# Patient Record
Sex: Female | Born: 1935
Health system: Southern US, Community
[De-identification: ages and names within clinical notes are randomized; demographics above are authoritative.]

## PROBLEM LIST (undated history)

## (undated) DIAGNOSIS — J189 Pneumonia, unspecified organism: Secondary | ICD-10-CM

## (undated) DIAGNOSIS — R600 Localized edema: Secondary | ICD-10-CM

## (undated) DIAGNOSIS — R350 Frequency of micturition: Secondary | ICD-10-CM

## (undated) DIAGNOSIS — M1711 Unilateral primary osteoarthritis, right knee: Secondary | ICD-10-CM

## (undated) DIAGNOSIS — T8453XA Infection and inflammatory reaction due to internal right knee prosthesis, initial encounter: Secondary | ICD-10-CM

## (undated) DIAGNOSIS — F32A Depression, unspecified: Secondary | ICD-10-CM

## (undated) DIAGNOSIS — E669 Obesity, unspecified: Secondary | ICD-10-CM

## (undated) DIAGNOSIS — N200 Calculus of kidney: Secondary | ICD-10-CM

## (undated) DIAGNOSIS — Z8739 Personal history of other diseases of the musculoskeletal system and connective tissue: Secondary | ICD-10-CM

## (undated) DIAGNOSIS — I2510 Atherosclerotic heart disease of native coronary artery without angina pectoris: Secondary | ICD-10-CM

## (undated) DIAGNOSIS — M549 Dorsalgia, unspecified: Secondary | ICD-10-CM

## (undated) DIAGNOSIS — K219 Gastro-esophageal reflux disease without esophagitis: Secondary | ICD-10-CM

## (undated) DIAGNOSIS — I10 Essential (primary) hypertension: Secondary | ICD-10-CM

## (undated) DIAGNOSIS — M2540 Effusion, unspecified joint: Secondary | ICD-10-CM

## (undated) DIAGNOSIS — E785 Hyperlipidemia, unspecified: Secondary | ICD-10-CM

## (undated) DIAGNOSIS — M1712 Unilateral primary osteoarthritis, left knee: Secondary | ICD-10-CM

## (undated) DIAGNOSIS — R0609 Other forms of dyspnea: Secondary | ICD-10-CM

## (undated) DIAGNOSIS — R609 Edema, unspecified: Secondary | ICD-10-CM

## (undated) DIAGNOSIS — I82402 Acute embolism and thrombosis of unspecified deep veins of left lower extremity: Secondary | ICD-10-CM

## (undated) DIAGNOSIS — M1990 Unspecified osteoarthritis, unspecified site: Secondary | ICD-10-CM

## (undated) DIAGNOSIS — F329 Major depressive disorder, single episode, unspecified: Secondary | ICD-10-CM

## (undated) DIAGNOSIS — T7840XA Allergy, unspecified, initial encounter: Secondary | ICD-10-CM

## (undated) DIAGNOSIS — M2550 Pain in unspecified joint: Secondary | ICD-10-CM

## (undated) DIAGNOSIS — Z96659 Presence of unspecified artificial knee joint: Secondary | ICD-10-CM

## (undated) DIAGNOSIS — A491 Streptococcal infection, unspecified site: Secondary | ICD-10-CM

## (undated) HISTORY — DX: Allergy, unspecified, initial encounter: T78.40XA

## (undated) HISTORY — PX: TUBAL LIGATION: SHX77

## (undated) HISTORY — PX: COLONOSCOPY: SHX174

## (undated) HISTORY — PX: TONSILLECTOMY AND ADENOIDECTOMY: SUR1326

## (undated) HISTORY — DX: Atherosclerotic heart disease of native coronary artery without angina pectoris: I25.10

## (undated) HISTORY — DX: Calculus of kidney: N20.0

## (undated) HISTORY — PX: APPENDECTOMY: SHX54

## (undated) HISTORY — DX: Unspecified osteoarthritis, unspecified site: M19.90

## (undated) HISTORY — PX: CORONARY ANGIOPLASTY WITH STENT PLACEMENT: SHX49

## (undated) HISTORY — PX: CATARACT EXTRACTION W/ INTRAOCULAR LENS  IMPLANT, BILATERAL: SHX1307

## (undated) HISTORY — PX: ESOPHAGOGASTRODUODENOSCOPY: SHX1529

## (undated) HISTORY — DX: Gastro-esophageal reflux disease without esophagitis: K21.9

## (undated) HISTORY — DX: Hyperlipidemia, unspecified: E78.5

## (undated) HISTORY — DX: Essential (primary) hypertension: I10

## (undated) HISTORY — PX: FACIAL COSMETIC SURGERY: SHX629

## (undated) HISTORY — PX: EYE SURGERY: SHX253

## (undated) HISTORY — PX: OTHER SURGICAL HISTORY: SHX169

## (undated) HISTORY — PX: CARDIAC CATHETERIZATION: SHX172

---

## 1996-07-12 DIAGNOSIS — K219 Gastro-esophageal reflux disease without esophagitis: Secondary | ICD-10-CM | POA: Insufficient documentation

## 1996-07-12 DIAGNOSIS — M1990 Unspecified osteoarthritis, unspecified site: Secondary | ICD-10-CM

## 1996-07-12 HISTORY — DX: Unspecified osteoarthritis, unspecified site: M19.90

## 1999-03-15 DIAGNOSIS — E785 Hyperlipidemia, unspecified: Secondary | ICD-10-CM

## 1999-03-15 HISTORY — DX: Hyperlipidemia, unspecified: E78.5

## 1999-06-13 DIAGNOSIS — I10 Essential (primary) hypertension: Secondary | ICD-10-CM

## 1999-06-13 HISTORY — DX: Essential (primary) hypertension: I10

## 2002-08-26 DIAGNOSIS — T7840XA Allergy, unspecified, initial encounter: Secondary | ICD-10-CM

## 2002-08-26 HISTORY — DX: Allergy, unspecified, initial encounter: T78.40XA

## 2004-02-03 ENCOUNTER — Ambulatory Visit: Payer: Self-pay | Admitting: Family Medicine

## 2004-03-10 ENCOUNTER — Ambulatory Visit: Payer: Self-pay | Admitting: Family Medicine

## 2004-04-30 ENCOUNTER — Ambulatory Visit: Payer: Self-pay | Admitting: Family Medicine

## 2004-05-05 ENCOUNTER — Ambulatory Visit: Payer: Self-pay | Admitting: Family Medicine

## 2004-07-13 ENCOUNTER — Ambulatory Visit: Payer: Self-pay | Admitting: Family Medicine

## 2004-11-16 ENCOUNTER — Ambulatory Visit: Payer: Self-pay | Admitting: Family Medicine

## 2004-12-16 ENCOUNTER — Ambulatory Visit: Payer: Self-pay | Admitting: Family Medicine

## 2005-06-09 ENCOUNTER — Ambulatory Visit: Payer: Self-pay | Admitting: Family Medicine

## 2005-07-05 ENCOUNTER — Other Ambulatory Visit: Admission: RE | Admit: 2005-07-05 | Discharge: 2005-07-05 | Payer: Self-pay | Admitting: Family Medicine

## 2005-07-05 ENCOUNTER — Encounter (INDEPENDENT_AMBULATORY_CARE_PROVIDER_SITE_OTHER): Payer: Self-pay | Admitting: *Deleted

## 2005-07-05 ENCOUNTER — Ambulatory Visit: Payer: Self-pay | Admitting: Family Medicine

## 2005-08-17 ENCOUNTER — Ambulatory Visit: Payer: Self-pay | Admitting: Family Medicine

## 2005-08-30 ENCOUNTER — Ambulatory Visit: Payer: Self-pay | Admitting: Family Medicine

## 2006-02-17 ENCOUNTER — Ambulatory Visit: Payer: Self-pay | Admitting: Family Medicine

## 2006-06-21 ENCOUNTER — Encounter: Payer: Self-pay | Admitting: Family Medicine

## 2006-06-21 DIAGNOSIS — E669 Obesity, unspecified: Secondary | ICD-10-CM | POA: Insufficient documentation

## 2006-06-21 DIAGNOSIS — I259 Chronic ischemic heart disease, unspecified: Secondary | ICD-10-CM | POA: Insufficient documentation

## 2006-06-21 DIAGNOSIS — J45909 Unspecified asthma, uncomplicated: Secondary | ICD-10-CM | POA: Insufficient documentation

## 2006-06-21 DIAGNOSIS — E66811 Obesity, class 1: Secondary | ICD-10-CM

## 2006-06-21 HISTORY — DX: Obesity, class 1: E66.811

## 2006-06-21 HISTORY — DX: Obesity, unspecified: E66.9

## 2006-07-10 ENCOUNTER — Ambulatory Visit: Payer: Self-pay | Admitting: Family Medicine

## 2006-07-10 LAB — CONVERTED CEMR LAB
ALT: 22 units/L (ref 0–40)
AST: 23 units/L (ref 0–37)
Alkaline Phosphatase: 95 units/L (ref 39–117)
BUN: 10 mg/dL (ref 6–23)
Basophils Relative: 3.6 % — ABNORMAL HIGH (ref 0.0–1.0)
Bilirubin, Direct: 0.1 mg/dL (ref 0.0–0.3)
CO2: 30 meq/L (ref 19–32)
Calcium: 8.7 mg/dL (ref 8.4–10.5)
Chloride: 107 meq/L (ref 96–112)
Eosinophils Relative: 1.4 % (ref 0.0–5.0)
GFR calc non Af Amer: 106 mL/min
Glucose, Bld: 104 mg/dL — ABNORMAL HIGH (ref 70–99)
LDL Cholesterol: 106 mg/dL — ABNORMAL HIGH (ref 0–99)
Platelets: 251 10*3/uL (ref 150–400)
RBC: 4.28 M/uL (ref 3.87–5.11)
Total CHOL/HDL Ratio: 3.6
Total Protein: 7.2 g/dL (ref 6.0–8.3)
Triglycerides: 85 mg/dL (ref 0–149)
VLDL: 17 mg/dL (ref 0–40)
WBC: 6.7 10*3/uL (ref 4.5–10.5)

## 2006-07-11 ENCOUNTER — Ambulatory Visit: Payer: Self-pay | Admitting: Family Medicine

## 2006-10-02 ENCOUNTER — Ambulatory Visit: Payer: Self-pay | Admitting: Family Medicine

## 2006-10-02 ENCOUNTER — Encounter: Payer: Self-pay | Admitting: Family Medicine

## 2006-10-03 ENCOUNTER — Encounter: Payer: Self-pay | Admitting: Family Medicine

## 2006-10-04 ENCOUNTER — Encounter (INDEPENDENT_AMBULATORY_CARE_PROVIDER_SITE_OTHER): Payer: Self-pay | Admitting: *Deleted

## 2006-10-05 ENCOUNTER — Ambulatory Visit: Payer: Self-pay | Admitting: Family Medicine

## 2006-10-05 DIAGNOSIS — E749 Disorder of carbohydrate metabolism, unspecified: Secondary | ICD-10-CM | POA: Insufficient documentation

## 2006-10-10 ENCOUNTER — Ambulatory Visit: Payer: Self-pay | Admitting: Family Medicine

## 2007-01-26 ENCOUNTER — Ambulatory Visit: Payer: Self-pay | Admitting: Internal Medicine

## 2007-01-31 ENCOUNTER — Encounter (INDEPENDENT_AMBULATORY_CARE_PROVIDER_SITE_OTHER): Payer: Self-pay | Admitting: *Deleted

## 2007-02-07 ENCOUNTER — Ambulatory Visit: Payer: Self-pay | Admitting: Family Medicine

## 2007-03-12 ENCOUNTER — Telehealth: Payer: Self-pay | Admitting: Family Medicine

## 2007-03-22 ENCOUNTER — Telehealth (INDEPENDENT_AMBULATORY_CARE_PROVIDER_SITE_OTHER): Payer: Self-pay | Admitting: *Deleted

## 2007-05-25 ENCOUNTER — Ambulatory Visit: Payer: Self-pay | Admitting: Family Medicine

## 2007-05-27 LAB — CONVERTED CEMR LAB
ALT: 14 units/L (ref 0–35)
AST: 14 units/L (ref 0–37)
Albumin: 3.4 g/dL — ABNORMAL LOW (ref 3.5–5.2)
Alkaline Phosphatase: 89 units/L (ref 39–117)
BUN: 7 mg/dL (ref 6–23)
Basophils Absolute: 0 10*3/uL (ref 0.0–0.1)
Calcium: 8.9 mg/dL (ref 8.4–10.5)
Chloride: 106 meq/L (ref 96–112)
Cholesterol: 163 mg/dL (ref 0–200)
Creatinine, Ser: 0.5 mg/dL (ref 0.4–1.2)
Creatinine,U: 142.8 mg/dL
Eosinophils Absolute: 0.1 10*3/uL (ref 0.0–0.6)
GFR calc non Af Amer: 130 mL/min
HCT: 37.1 % (ref 36.0–46.0)
LDL Cholesterol: 102 mg/dL — ABNORMAL HIGH (ref 0–99)
MCHC: 32.8 g/dL (ref 30.0–36.0)
MCV: 86.9 fL (ref 78.0–100.0)
Neutrophils Relative %: 64.7 % (ref 43.0–77.0)
Platelets: 238 10*3/uL (ref 150–400)
RBC: 4.26 M/uL (ref 3.87–5.11)
RDW: 14.6 % (ref 11.5–14.6)
Sodium: 140 meq/L (ref 135–145)
TSH: 1.5 microintl units/mL (ref 0.35–5.50)
Total Bilirubin: 0.6 mg/dL (ref 0.3–1.2)
Total CHOL/HDL Ratio: 3.5
Triglycerides: 71 mg/dL (ref 0–149)

## 2007-06-01 ENCOUNTER — Ambulatory Visit: Payer: Self-pay | Admitting: Family Medicine

## 2007-06-01 ENCOUNTER — Encounter: Payer: Self-pay | Admitting: Family Medicine

## 2007-06-01 ENCOUNTER — Other Ambulatory Visit: Admission: RE | Admit: 2007-06-01 | Discharge: 2007-06-01 | Payer: Self-pay | Admitting: Family Medicine

## 2007-06-01 LAB — CONVERTED CEMR LAB: Pap Smear: NORMAL

## 2007-06-07 ENCOUNTER — Encounter (INDEPENDENT_AMBULATORY_CARE_PROVIDER_SITE_OTHER): Payer: Self-pay | Admitting: *Deleted

## 2007-06-20 ENCOUNTER — Encounter: Payer: Self-pay | Admitting: Family Medicine

## 2007-08-11 ENCOUNTER — Emergency Department: Payer: Self-pay | Admitting: Internal Medicine

## 2008-02-18 ENCOUNTER — Emergency Department: Payer: Self-pay | Admitting: Emergency Medicine

## 2008-04-23 ENCOUNTER — Encounter: Payer: Self-pay | Admitting: Family Medicine

## 2008-07-07 ENCOUNTER — Telehealth: Payer: Self-pay | Admitting: Family Medicine

## 2008-09-18 ENCOUNTER — Ambulatory Visit: Payer: Self-pay | Admitting: Family Medicine

## 2008-09-18 LAB — CONVERTED CEMR LAB
Alkaline Phosphatase: 86 units/L (ref 39–117)
BUN: 12 mg/dL (ref 6–23)
Basophils Relative: 0.9 % (ref 0.0–3.0)
Bilirubin, Direct: 0.1 mg/dL (ref 0.0–0.3)
Calcium: 8.9 mg/dL (ref 8.4–10.5)
Chloride: 105 meq/L (ref 96–112)
Cholesterol: 213 mg/dL — ABNORMAL HIGH (ref 0–200)
Creatinine, Ser: 0.5 mg/dL (ref 0.4–1.2)
Creatinine,U: 177.1 mg/dL
Direct LDL: 145.5 mg/dL
Eosinophils Absolute: 0.1 10*3/uL (ref 0.0–0.7)
Eosinophils Relative: 1 % (ref 0.0–5.0)
Lymphocytes Relative: 36.3 % (ref 12.0–46.0)
MCHC: 33.5 g/dL (ref 30.0–36.0)
MCV: 89.3 fL (ref 78.0–100.0)
Microalb, Ur: 1.2 mg/dL (ref 0.0–1.9)
Monocytes Absolute: 0.4 10*3/uL (ref 0.1–1.0)
Neutrophils Relative %: 54.9 % (ref 43.0–77.0)
Platelets: 212 10*3/uL (ref 150.0–400.0)
RBC: 4.2 M/uL (ref 3.87–5.11)
Total Bilirubin: 0.8 mg/dL (ref 0.3–1.2)
Total CHOL/HDL Ratio: 4
Triglycerides: 63 mg/dL (ref 0.0–149.0)
VLDL: 12.6 mg/dL (ref 0.0–40.0)
WBC: 6.5 10*3/uL (ref 4.5–10.5)

## 2008-09-20 LAB — CONVERTED CEMR LAB: Vit D, 25-Hydroxy: 10 ng/mL — ABNORMAL LOW (ref 30–89)

## 2008-09-23 ENCOUNTER — Encounter: Payer: Self-pay | Admitting: Family Medicine

## 2008-09-23 ENCOUNTER — Ambulatory Visit: Payer: Self-pay | Admitting: Family Medicine

## 2008-09-23 ENCOUNTER — Other Ambulatory Visit: Admission: RE | Admit: 2008-09-23 | Discharge: 2008-09-23 | Payer: Self-pay | Admitting: Family Medicine

## 2008-09-23 DIAGNOSIS — E559 Vitamin D deficiency, unspecified: Secondary | ICD-10-CM | POA: Insufficient documentation

## 2008-09-23 DIAGNOSIS — F341 Dysthymic disorder: Secondary | ICD-10-CM | POA: Insufficient documentation

## 2008-09-26 ENCOUNTER — Encounter (INDEPENDENT_AMBULATORY_CARE_PROVIDER_SITE_OTHER): Payer: Self-pay | Admitting: *Deleted

## 2008-10-18 ENCOUNTER — Emergency Department: Payer: Self-pay | Admitting: Emergency Medicine

## 2008-10-30 ENCOUNTER — Emergency Department: Payer: Self-pay | Admitting: Emergency Medicine

## 2008-12-05 ENCOUNTER — Telehealth (INDEPENDENT_AMBULATORY_CARE_PROVIDER_SITE_OTHER): Payer: Self-pay | Admitting: *Deleted

## 2008-12-19 ENCOUNTER — Ambulatory Visit: Payer: Self-pay | Admitting: Family Medicine

## 2008-12-20 LAB — CONVERTED CEMR LAB: Vit D, 25-Hydroxy: 31 ng/mL (ref 30–89)

## 2008-12-24 ENCOUNTER — Ambulatory Visit: Payer: Self-pay | Admitting: Family Medicine

## 2009-04-28 ENCOUNTER — Telehealth: Payer: Self-pay | Admitting: Family Medicine

## 2009-04-29 ENCOUNTER — Ambulatory Visit: Payer: Self-pay | Admitting: Family Medicine

## 2009-04-29 DIAGNOSIS — IMO0002 Reserved for concepts with insufficient information to code with codable children: Secondary | ICD-10-CM | POA: Insufficient documentation

## 2009-05-12 ENCOUNTER — Encounter: Payer: Self-pay | Admitting: Family Medicine

## 2009-06-22 ENCOUNTER — Ambulatory Visit: Payer: Self-pay | Admitting: Family Medicine

## 2009-06-22 LAB — CONVERTED CEMR LAB
BUN: 9 mg/dL (ref 6–23)
Basophils Absolute: 0.1 10*3/uL (ref 0.0–0.1)
Bilirubin, Direct: 0.1 mg/dL (ref 0.0–0.3)
Calcium: 9.3 mg/dL (ref 8.4–10.5)
Cholesterol: 183 mg/dL (ref 0–200)
Creatinine, Ser: 0.5 mg/dL (ref 0.4–1.2)
Creatinine,U: 29.3 mg/dL
Eosinophils Absolute: 0.1 10*3/uL (ref 0.0–0.7)
GFR calc non Af Amer: 156.21 mL/min (ref >60)
HDL: 57.1 mg/dL (ref >39.00)
Hemoglobin: 13.6 g/dL (ref 12.0–15.0)
LDL Cholesterol: 112 mg/dL — ABNORMAL HIGH (ref 0–99)
Lymphocytes Relative: 27 % (ref 12.0–46.0)
MCHC: 34 g/dL (ref 30.0–36.0)
Neutro Abs: 5.4 10*3/uL (ref 1.4–7.7)
Neutrophils Relative %: 66.1 % (ref 43.0–77.0)
Platelets: 260 10*3/uL (ref 150.0–400.0)
RDW: 14.2 % (ref 11.5–14.6)
TSH: 1.98 microintl units/mL (ref 0.35–5.50)
Total Bilirubin: 0.6 mg/dL (ref 0.3–1.2)
Triglycerides: 71 mg/dL (ref 0.0–149.0)
VLDL: 14.2 mg/dL (ref 0.0–40.0)

## 2009-07-02 ENCOUNTER — Ambulatory Visit: Payer: Self-pay | Admitting: Family Medicine

## 2009-07-09 ENCOUNTER — Telehealth: Payer: Self-pay | Admitting: Family Medicine

## 2009-07-10 ENCOUNTER — Ambulatory Visit: Payer: Self-pay | Admitting: Family Medicine

## 2009-07-20 ENCOUNTER — Ambulatory Visit: Payer: Self-pay | Admitting: Family Medicine

## 2009-07-29 ENCOUNTER — Encounter: Payer: Self-pay | Admitting: Family Medicine

## 2009-07-29 ENCOUNTER — Ambulatory Visit: Payer: Self-pay | Admitting: Ophthalmology

## 2009-08-03 ENCOUNTER — Ambulatory Visit: Payer: Self-pay | Admitting: Cardiovascular Disease

## 2009-08-10 ENCOUNTER — Ambulatory Visit: Payer: Self-pay | Admitting: Ophthalmology

## 2009-09-25 ENCOUNTER — Telehealth (INDEPENDENT_AMBULATORY_CARE_PROVIDER_SITE_OTHER): Payer: Self-pay | Admitting: *Deleted

## 2009-09-28 ENCOUNTER — Ambulatory Visit: Payer: Self-pay | Admitting: Family Medicine

## 2009-09-29 ENCOUNTER — Encounter: Payer: Self-pay | Admitting: Family Medicine

## 2009-10-02 ENCOUNTER — Ambulatory Visit: Payer: Self-pay | Admitting: Family Medicine

## 2009-10-02 LAB — CONVERTED CEMR LAB: Vit D, 25-Hydroxy: 19 ng/mL — ABNORMAL LOW (ref 30–89)

## 2009-10-13 ENCOUNTER — Ambulatory Visit: Payer: Self-pay | Admitting: Family Medicine

## 2009-10-13 ENCOUNTER — Encounter: Payer: Self-pay | Admitting: Family Medicine

## 2009-10-13 ENCOUNTER — Encounter (INDEPENDENT_AMBULATORY_CARE_PROVIDER_SITE_OTHER): Payer: Self-pay | Admitting: *Deleted

## 2009-10-14 ENCOUNTER — Encounter (INDEPENDENT_AMBULATORY_CARE_PROVIDER_SITE_OTHER): Payer: Self-pay | Admitting: *Deleted

## 2009-10-15 ENCOUNTER — Encounter (INDEPENDENT_AMBULATORY_CARE_PROVIDER_SITE_OTHER): Payer: Self-pay | Admitting: *Deleted

## 2009-11-24 ENCOUNTER — Ambulatory Visit: Payer: Self-pay | Admitting: Ophthalmology

## 2009-11-30 ENCOUNTER — Ambulatory Visit: Payer: Self-pay | Admitting: Ophthalmology

## 2009-12-01 ENCOUNTER — Ambulatory Visit: Payer: Self-pay | Admitting: Family Medicine

## 2009-12-02 LAB — CONVERTED CEMR LAB: Vit D, 25-Hydroxy: 30 ng/mL (ref 30–89)

## 2010-02-22 DIAGNOSIS — R112 Nausea with vomiting, unspecified: Secondary | ICD-10-CM | POA: Insufficient documentation

## 2010-02-23 ENCOUNTER — Ambulatory Visit: Payer: Self-pay | Admitting: Internal Medicine

## 2010-02-23 ENCOUNTER — Encounter (INDEPENDENT_AMBULATORY_CARE_PROVIDER_SITE_OTHER): Payer: Self-pay | Admitting: *Deleted

## 2010-04-14 NOTE — Progress Notes (Signed)
Summary: legs are swelling  Phone Note Call from Patient Call back at [phone]   Caller: Daughter-Carolyn Summary of Call: Daughter states pt's legs are swelling around the knees x 2 days.  She is taking her lasix, using ben gay and elevating as much as she can.  Should she increase her lasix until she can be seen? Initial call taken by: Lowella Petties CMA,  April 28, 2009 9:21 AM  Follow-up for Phone Call        Not until Apr!  May take one dose extra. If limited to knees, she may have orthopedic problem, poss arthritis flare. Should be seen when able. Follow-up by: Shaune Leeks MD,  April 28, 2009 9:40 AM  Additional Follow-up for Phone Call Additional follow up Details #1::        Advised pt, appt made for tomorrow, refill on lasix called in. Additional Follow-up by: Lowella Petties CMA,  April 28, 2009 10:02 AM    Prescriptions: FUROSEMIDE 20 MG TABS (FUROSEMIDE) one tab by mouth in AM  #30 Each x 3   Entered by:   Lowella Petties CMA   Authorized by:   Shaune Leeks MD   Signed by:   Lowella Petties CMA on 04/28/2009   Method used:   Electronically to        [facility]  [address] (retail)       [address], [address]       [place], [place]  [address]       Ph: [phone]       Fax: [phone]   RxID:   [id number]

## 2010-04-14 NOTE — Assessment & Plan Note (Signed)
Summary: check blood pressure  Nurse Visit   Vital Signs:  Patient profile:   75 year old female Weight:      221 pounds Pulse rate:   60 / minute Pulse rhythm:   regular BP sitting:   160 / 78  (left arm) Cuff size:   large  Vitals Entered By: Lowella Petties CMA (July 10, 2009 10:56 AM) CC: Nurse visit- blood pressure check   Allergies: 1)  ! Penicillin V Potassium (Penicillin V Potassium)  Pt had episode at work yesterday of feeling very weak, had rapid heart rate. Her employer told her to come and have her BP checked today.  Pt has been out of her medicine (didnt know the name) all week but she has gotten some from her pharmacy and will start back on it.             Lowella Petties CMA  July 10, 2009 10:58 AM We just talked about the importance of not missintg her medications last week. Have her come in next week to have it rechecked when back on the medication for at least one week. Shaune Leeks MD  Jul 12, 2009 10:23 PM  Called pt, she's not in, will call her back later.             Lowella Petties CMA  Jul 13, 2009 9:39 AM  Advised pt, appt made.                Lowella Petties CMA  Jul 13, 2009 12:16 PM

## 2010-04-14 NOTE — Letter (Signed)
Summary: Nadara Eaton letter  [place] at [facility]  [address] [place], [place] [address]   Phone: [phone]  Fax: [phone]       10/15/2009 MRN: [id number]  [facility] [address] [facility] [address] [place], [place]  [address]  Dear Ms. Cleotis Nipper,  [facility] [place] and [place] announce the retirement of Arta Silence, M.D., from full-time practice at the [facility] office effective September 10, 2009 and his plans of returning part-time.  It is important to Dr. Hetty Ely and to our practice that you understand that [facility] has seven physicians in our office for your health care needs.  We will continue to offer the same exceptional care that you have today.    Dr. Hetty Ely has spoken to many of you about his plans for retirement and returning part-time in the fall.   We will continue to work with you through the transition to schedule appointments for you in the office and meet the high standards that [place] is committed to.   Again, it is with great pleasure that we share the news that Dr. Hetty Ely will return to [facility] in October of 2011 with a reduced schedule.    If you have any questions, or would like to request an appointment with one of our physicians, please call us at [phone] and press the option for Scheduling an appointment.  We take pleasure in providing you with excellent patient care and look forward to seeing you at your next office visit.  Our [facility] Physicians are:  Tillman Abide, M.D. Laurita Quint, M.D. Roxy Manns, M.D. Kerby Nora, M.D. Hannah Beat, M.D. Ruthe Mannan, M.D. We proudly welcomed Raechel Ache, M.D. and Eustaquio Boyden, M.D. to the practice in July/August 2011.  Sincerely,  [facility]

## 2010-04-14 NOTE — Assessment & Plan Note (Signed)
Summary: ROA   Vital Signs:  Patient profile:   75 year old female Weight:      222.25 pounds BMI:     36.00 Temp:     98.1 degrees F oral Pulse rate:   64 / minute Pulse rhythm:   regular BP sitting:   142 / 78  (left arm) Cuff size:   large  Vitals Entered By: Sydell Axon LPN (July 02, 2009 2:43 PM) CC: Follow-up after labs   History of Present Illness: Pt here for followup of Vit D. Sghe was therapeutic on 50000Iu weekly and was switched to OTC Vit D 1000Iu which she is only taking once a day. I also checked her other labs since I'm not going to be here this summer. She feels well exceopt for her left knee which she had another shot in which has helped but is still a signif problem. They want to do replacement. We discussed this.  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 1 Daily By Mouth 2)  Atenolol 25 Mg Tabs (Atenolol) .Marland Kitchen.. 1 Daily By Mouth 3)  Lipitor 80 Mg Tabs (Atorvastatin Calcium) .... One Ntab By Mouth At Night. 4)  Zetia 10 Mg Tabs (Ezetimibe) .Marland Kitchen.. 1 Daily By Mouth 5)  Furosemide 20 Mg Tabs (Furosemide) .... One Tab By Mouth in Am 6)  Zoloft 50 Mg  Tabs (Sertraline Hcl) .... Take 1 Tab Every Day 7)  Taztia Xt 240 Mg  Cp24 (Diltiazem Hcl Er Beads) .Marland Kitchen.. 1 Tablet Daily 8)  Lansoprazole 15 Mg Cpdr (Lansoprazole) .... One Tab By Mouth 45 Mins Before Brfst. 9)  Vitamin D  (Ergocalciferol) 50000 Unit Caps (Ergocalciferol) .... One Tab By Mouth Once A Week 10)  Vicodin 5-500 Mg Tabs (Hydrocodone-Acetaminophen) .... One Tab By Mouth Every 4 Hrs.  Allergies: 1)  ! Penicillin V Potassium (Penicillin V Potassium)  Physical Exam  General:  Well-developed,well-nourished,in no acute distress; alert,appropriate and cooperative throughout examination, obese. Head:  Normocephalic and atraumatic without obvious abnormalities. No apparent alopecia or balding. Eyes:  Conjunctiva clear bilaterally.  Ears:  External ear exam shows no significant lesions or deformities.  Otoscopic examination reveals clear canals, tympanic membranes are intact bilaterally without bulging, R TM scarred,  no retraction, inflammation or discharge. Hearing is grossly normal bilaterally. Nose:  External nasal examination shows no deformity or inflammation. Nasal mucosa are pink and moist without lesions or exudates. Mouth:  Oral mucosa and oropharynx without lesions or exudates.  Teeth in good repair. Palastal repair in remote past. Neck:  No deformities, masses, or tenderness noted. Lungs:  Normal respiratory effort, chest expands symmetrically. Lungs are clear to auscultation, no crackles or wheezes. Heart:  Normal rate and regular rhythm. S1 and S2 normal without gallop, murmur, click, rub or other extra sounds.   Impression & Recommendations:  Problem # 1:  UNSPECIFIED VITAMIN D DEFICIENCY (ICD-268.9) Assessment Deteriorated Back subtherapeutic....start 1000Iu two times a day  recheck in the future.  Problem # 2:  KNEE SPRAIN, LEFT (ICD-844.9) Assessment: Unchanged Slightly better. Trmty per Ortho.  Problem # 3:  DISORDER, CARBOHYDRATE METABOLISM NOS (ICD-271.9) Assessment: Improved Euglycemic but over 90. encouraged to cont to restrict sweets and carbs.  Problem # 4:  OBESITY NOS (ICD-278.00) Assessment: Unchanged Still needs to lose weight.  Problem # 5:  HYPERTENSION  (ICD-401.9) Assessment: Improved Minimally improved. Rather than increase meds, encopuraged her to avoid salt...she tends to use it freely. Her updated medication list for this problem includes:    Atenolol 25 Mg Tabs (Atenolol) .Marland Kitchen... 1 daily by mouth    Furosemide 20 Mg Tabs (Furosemide) ..... One tab by mouth in am    Taztia Xt 240 Mg Cp24 (Diltiazem hcl er beads) .Marland Kitchen... 1 tablet daily  BP today: 142/78 Prior BP: 148/70 (04/29/2009)  Labs Reviewed: K+: 4.3 (06/22/2009) Creat: : 0.5 (06/22/2009)   Chol: 183 (06/22/2009)   HDL: 57.10 (06/22/2009)   LDL: 112 (06/22/2009)   TG: 71.0 (06/22/2009)  Problem # 6:  HYPERLIPIDEMIA (ICD-272.4) Assessment: Improved Good nos, cont meds. Her updated medication list for this problem includes:    Lipitor 80 Mg Tabs (Atorvastatin calcium) ..... One ntab by mouth at night.    Zetia 10 Mg Tabs (Ezetimibe) .Marland Kitchen... 1 daily by mouth  Labs Reviewed: SGOT: 20 (06/22/2009)   SGPT: 16 (06/22/2009)   HDL:57.10 (06/22/2009), 52.40 (09/18/2008)  LDL:112 (06/22/2009), 102 (05/25/2007)  Chol:183 (06/22/2009), 213 (09/18/2008)  Trig:71.0 (06/22/2009), 63.0 (09/18/2008)  Complete Medication List: 1)  Aspir-low 81 Mg Tbec (Aspirin) .Marland Kitchen.. 1 daily by mouth 2)  Atenolol 25 Mg Tabs (Atenolol) .Marland Kitchen.. 1 daily by mouth 3)  Lipitor 80 Mg Tabs (Atorvastatin calcium) .... One ntab by mouth at night. 4)  Zetia 10 Mg Tabs (Ezetimibe) .Marland Kitchen.. 1 daily by mouth 5)  Furosemide 20 Mg Tabs (Furosemide) .... One tab by mouth in am 6)  Zoloft 50 Mg Tabs (Sertraline hcl) .... Take 1 tab every day 7)  Taztia Xt 240 Mg Cp24 (Diltiazem hcl er beads) .Marland Kitchen.. 1 tablet daily 8)  Lansoprazole 15 Mg Cpdr (Lansoprazole) .... One tab by mouth 45 mins before brfst. 9)  Vitamin D (ergocalciferol) 50000 Unit Caps (Ergocalciferol) .... One tab by mouth once a week 10)  Vicodin 5-500 Mg Tabs (Hydrocodone-acetaminophen) .... One tab by mouth every 4 hrs.  Patient Instructions: 1)  RTC for comp exam in  summer.  Current Allergies (reviewed today): ! PENICILLIN V POTASSIUM (PENICILLIN V POTASSIUM)

## 2010-04-14 NOTE — Letter (Signed)
Summary: Unable To Reach-Consult Scheduled  [place] at [facility]  [address] [place], [place] [address]   Phone: [phone]  Fax: [phone]    10/15/2009 MRN: [id number]    Dear Ms. Leavy,   We have been unable to reach you by phone.  Please contact our office with an updated phone number.  At the recommendation of Dr.Graham Para March, we have been asked to schedule you a consult with [facility]ology Physician.  Their phone number is [phone]. If you wish to decline this appointment, call the number of the office you are being referred to listed above and let them know.  If you have any question please call us.     Thank you,  Patient Care Coordinator [place] at [facility] [phone] Ask for Aram Beecham
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
## 2010-04-14 NOTE — Letter (Signed)
Summary: Results Follow up Letter  [place] at [facility]  [address] [place], [place] [address]   Phone: [phone]  Fax: [phone]    10/14/2009 MRN: [id number]    [facility] [address] [facility] [address] [place], [place]  [address]    Dear Ms. Bechtol,  The following are the results of your recent test(s):  Test         Result    Pap Smear:        Normal _____  Not Normal _____ Comments: ______________________________________________________ Cholesterol: LDL(Bad cholesterol):         Your goal is less than:         HDL (Good cholesterol):       Your goal is more than: Comments:  ______________________________________________________ Mammogram:        Normal __X___  Not Normal _____ Comments:  Yearly follow up is recommended.   ___________________________________________________________________ Hemoccult:        Normal _____  Not normal _______ Comments:    _____________________________________________________________________ Other Tests:    We routinely do not discuss normal results over the telephone.  If you desire a copy of the results, or you have any questions about this information we can discuss them at your next office visit.   Sincerely,    Dwana Curd. Para March, M.D.  [facility]

## 2010-04-14 NOTE — Letter (Signed)
Summary: Dr.C.Smith,[facility],Note  Dr.C.Smith, Orthopaedics,Note   Imported By: Beau Fanny 06/30/2009 16:27:05  _____________________________________________________________________  External Attachment:    Type:   Image     Comment:   External Document

## 2010-04-14 NOTE — Miscellaneous (Signed)
  Clinical Lists Changes  Observations: Added new observation of MAMMO DUE: 10/14/2010 (10/13/2009 14:39) Added new observation of MAMMOGRAM: Normal (10/13/2009 14:39)

## 2010-04-14 NOTE — Assessment & Plan Note (Signed)
Summary: FOLLOW UP WITH BLOOD PRESSURE   Vital Signs:  Patient profile:   75 year old female Weight:      223.75 pounds Temp:     98.3 degrees F oral Pulse rate:   56 / minute Pulse rhythm:   regular BP sitting:   120 / 68  (left arm) Cuff size:   large  Vitals Entered By: Sydell Axon LPN (Jul 20, 1608 3:45 PM) CC: Follow-up on BP   History of Present Illness: Pt here for followup of htn. She had bad stretch where she hadn't taken her medication for missing it and shee had bad headaches and felt awful. She is finally convinced to take her meds.  She is to have catarracts removed when cleared by cardiology. She had injections in the knee and they are doing well. She feels pretty well today with BP controlled and Vit D nml from last time.   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 1 Daily By Mouth 2)  Atenolol 25 Mg Tabs (Atenolol) .Marland Kitchen.. 1 Daily By Mouth 3)  Lipitor 80 Mg Tabs (Atorvastatin Calcium) .... One Ntab By Mouth At Night. 4)  Zetia 10 Mg Tabs (Ezetimibe) .Marland Kitchen.. 1 Daily By Mouth 5)  Furosemide 20 Mg Tabs (Furosemide) .... One Tab By Mouth in Am 6)  Zoloft 50 Mg  Tabs (Sertraline Hcl) .... Take 1 Tab Every Day 7)  Taztia Xt 240 Mg  Cp24 (Diltiazem Hcl Er Beads) .Marland Kitchen.. 1 Tablet Daily 8)  Lansoprazole 15 Mg Cpdr (Lansoprazole) .... One Tab By Mouth 45 Mins Before Brfst. 9)  Vitamin  D (Ergocalciferol) 50000 Unit Caps (Ergocalciferol) .... One Tab By Mouth Once A Week 10)  Vicodin 5-500 Mg Tabs (Hydrocodone-Acetaminophen) .... One Tab By Mouth Every 4 Hrs.  Allergies: 1)  ! Penicillin V Potassium (Penicillin V Potassium)  Physical Exam  General:  Well-developed,well-nourished,in no acute distress; alert,appropriate and cooperative throughout examination, obese. Head:  Normocephalic and atraumatic without obvious abnormalities. No apparent alopecia or balding. Eyes:  Conjunctiva clear bilaterally.  Ears:  External ear exam shows no significant lesions or deformities.  Otoscopic examination reveals clear canals, tympanic membranes are intact bilaterally without bulging, R TM scarred,  no retraction, inflammation or discharge. Hearing is grossly normal bilaterally. Nose:  External nasal examination shows no deformity or inflammation. Nasal mucosa are pink and moist without lesions or exudates. Mouth:  Oral mucosa and oropharynx without lesions or exudates.  Teeth in good repair. Palastal repair in remote past. Neck:  No deformities, masses, or tenderness noted. Lungs:  Normal respiratory effort, chest expands symmetrically. Lungs are clear to auscultation, no crackles or wheezes. Heart:  Normal rate and regular rhythm. S1 and S2 normal without gallop, murmur, click, rub or other extra sounds.   Impression & Recommendations:  Problem # 1:  HYPERTENSION (ICD-401.9) Assessment Improved Great improvement. Amazing what happens when one takes their meds!! Her updated medication  list for this problem includes:    Atenolol 25 Mg Tabs (Atenolol) .Marland Kitchen... 1 daily by mouth    Furosemide 20 Mg Tabs (Furosemide) ..... One tab by mouth in am    Taztia Xt 240 Mg Cp24 (Diltiazem hcl er beads) .Marland Kitchen... 1 tablet daily  BP today: 120/68 Prior BP: 160/78 (07/10/2009)  Labs Reviewed: K+: 4.3 (06/22/2009) Creat: : 0.5 (06/22/2009)   Chol: 183 (06/22/2009)   HDL: 57.10 (06/22/2009)   LDL: 112  (06/22/2009)   TG: 71.0 (06/22/2009)  Complete Medication List: 1)  Aspir-low 81 Mg Tbec (Aspirin) .Marland Kitchen.. 1 daily by mouth 2)  Atenolol 25 Mg Tabs (Atenolol) .Marland Kitchen.. 1 daily by mouth 3)  Lipitor 80 Mg Tabs (Atorvastatin calcium) .... One ntab by mouth at night. 4)  Furosemide 20 Mg Tabs (Furosemide) .... One tab by mouth in am 5)  Zoloft 50 Mg Tabs (Sertraline hcl) .... Take 1 tab every day 6)  Taztia Xt 240 Mg Cp24 (Diltiazem hcl er beads) .Marland Kitchen.. 1 tablet daily 7)  Lansoprazole 15 Mg Cpdr (Lansoprazole) .... One tab by mouth 45 mins before brfst. 8)  Vicodin 5-500 Mg Tabs (Hydrocodone-acetaminophen) .... One tab by mouth every 4 hrs. 9)  Vitamin D 1000 Unit Tabs (Cholecalciferol) .... Take one by mouth two times a day  Patient Instructions: 1)  RTC Jul for Comp Exam with Dr Para March, labs prior. 2)  Check Vit D in future.  Current Allergies (reviewed today): ! PENICILLIN V POTASSIUM (PENICILLIN V POTASSIUM)

## 2010-04-14 NOTE — Progress Notes (Signed)
----   Converted from flag ---- ---- 09/25/2009 1:32 PM, Crawford Givens MD wrote: She need a glucose (272.0) and vitamin D 268.9.  she had fasting labs done in 4/11 and we don't need to repeat all of them.  thanks.     This patient is coming in for CPX labs on Monday, Need orders and dx please, Have a good weekend! Terri ------------------------------

## 2010-04-14 NOTE — Assessment & Plan Note (Signed)
Summary: LEGS SWELLING   Vital Signs:  Patient profile:   75 year old female Weight:      225 pounds Temp:     98.7 degrees F oral Pulse rate:   64 / minute Pulse rhythm:   regular BP sitting:   148 / 70  (left arm) Cuff size:   large  Vitals Entered By: Sydell Axon LPN (April 29, 2009 2:39 PM) CC: Popped left knee Sunday, woke up Monday morning with knee swollen and painful   History of Present Illness: Pt was turning in the house Sun night, planted her foot and felt a pop in her knee. Minimal pain at the time. Woke up Mon AM with significant swelling in the knee which worsened over the next two days, now has consistent swelling and pain. No erythema, no fever or chills and knee not warm. No h/o knee probs in the past. Has seen ortho once in the remote past somewhere in [place].  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 1 Daily By Mouth 2)  Atenolol 25 Mg Tabs (Atenolol) .Marland Kitchen.. 1 Daily By Mouth 3)  Lipitor 80 Mg Tabs (Atorvastatin Calcium) .... One Ntab By Mouth At Night. 4)  Zetia 10 Mg Tabs (Ezetimibe) .Marland Kitchen.. 1 Daily By Mouth 5)  Furosemide 20 Mg Tabs (Furosemide) .... One Tab By Mouth in Am 6)  Zoloft 50 Mg  Tabs (Sertraline Hcl) .... Take 1 Tab Every Day 7)  Taztia Xt 240 Mg  Cp24 (Diltiazem Hcl Er Beads) .Marland Kitchen.. 1 Tablet Daily 8)  Lansoprazole 15 Mg Cpdr (Lansoprazole) .... One Tab By Mouth 45 Mins Before Brfst. 9)   Vitamin D (Ergocalciferol) 50000 Unit Caps (Ergocalciferol) .... One Tab By Mouth Once A Week  Allergies: 1)  ! Penicillin V Potassium (Penicillin V Potassium)  Physical Exam  General:  Well-developed,well-nourished,in no acute distress; alert,appropriate and cooperative throughout examination, obese. Head:  Normocephalic and atraumatic without obvious abnormalities. No apparent alopecia or balding. Eyes:  Conjunctiva clear bilaterally.  Ears:  External ear exam shows no significant lesions or deformities.  Otoscopic examination reveals clear canals, tympanic membranes are intact bilaterally without bulging, R TM scarred,  no retraction, inflammation or discharge. Hearing is grossly normal bilaterally. Nose:  External nasal examination shows no deformity or inflammation. Nasal mucosa are pink and moist without lesions or exudates. Mouth:  Oral mucosa and oropharynx without lesions or exudates.  Teeth in good repair. Palastal repair in remote past. Extremities:  R knee wnl. L knee with signif swelling in the joint L lat  joint line area >>med area, nio erythema or warmth, ROM limited by swelling.   Impression & Recommendations:  Problem # 1:  KNEE SPRAIN, LEFT (ICD-844.9) Assessment New take vicodin as needed . Rest, Ice, elevation, Compression,,,ace applied. Use ice every hour. Refer to Ortho for aspiration and eval,. Orders: Orthopedic Referral (Ortho)  Complete Medication List: 1)  Aspir-low 81 Mg Tbec (Aspirin) .Marland Kitchen.. 1 daily by mouth  2)  Atenolol 25 Mg Tabs (Atenolol) .Marland Kitchen.. 1 daily by mouth 3)  Lipitor 80 Mg Tabs (Atorvastatin calcium) .... One ntab by mouth at night. 4)  Zetia 10 Mg Tabs (Ezetimibe) .Marland Kitchen.. 1 daily by mouth 5)  Furosemide 20 Mg Tabs (Furosemide) .... One tab by mouth in am 6)  Zoloft 50 Mg Tabs (Sertraline hcl) .... Take 1 tab every day 7)  Taztia Xt 240 Mg Cp24 (Diltiazem hcl er beads) .Marland Kitchen.. 1 tablet daily 8)  Lansoprazole 15 Mg Cpdr (Lansoprazole) .... One tab  by mouth 45 mins before brfst. 9)  Vitamin D (ergocalciferol) 50000 Unit Caps (Ergocalciferol) .... One tab by mouth once a week 10)  Vicodin 5-500 Mg Tabs (Hydrocodone-acetaminophen) .... One tab by mouth every 4 hrs.  Patient Instructions: 1)  Refer to Ortho. Prescriptions: VICODIN 5-500 MG TABS (HYDROCODONE-ACETAMINOPHEN) one tab by mouth every 4 hrs.  #30 x 0   Entered and Authorized by:   Shaune Leeks MD   Signed by:   Shaune Leeks MD on 04/29/2009   Method used:   Print then Give to Patient   RxID:   [id number]   Current Allergies (reviewed today): ! PENICILLIN V POTASSIUM (PENICILLIN V POTASSIUM)

## 2010-04-14 NOTE — Progress Notes (Signed)
Summary: Rx Zoloft  Phone Note Refill Request Call back at [phone] Message from:  [facility] on July 09, 2009 3:14 PM  Refills Requested: Medication #1:  ZOLOFT 50 MG  TABS Take 1 tab every day   Last Refilled: 05/29/2009 Received faxed refill request please advise.   Method Requested: Telephone to Pharmacy Initial call taken by: Linde Gillis CMA Duncan Dull),  July 09, 2009 3:14 PM    Prescriptions: ZOLOFT 50 MG  TABS (SERTRALINE HCL) Take 1 tab every day  #30 x 12   Entered and Authorized by:   Shaune Leeks MD   Signed by:   Shaune Leeks MD on 07/09/2009   Method used:   [facility]ronically to        [facility] [address]ail)       [address], [address]       [place], [place]  [address]       Ph: [phone]       Fax: [phone]   RxID:   [id number]

## 2010-04-14 NOTE — Letter (Signed)
Summary: Primary Care Consult Scheduled Letter  [place] at [facility]  [address] [place], [place] [address]   Phone: [phone]  Fax: [phone]      10/13/2009 MRN: [id number]  [facility] [place] [address] [place], [place]  [address]    Dear Ms. Paden,      We have scheduled an appointment for you.  At the recommendation of Dr.Shaw Para March, we have scheduled you a consult with [facility] on October 7,2010 at 10:00am.  Their address is [address] [place].  The office phone number is [phone].  If this appointment day and time is not convenient for you, please feel free to call the office of the doctor you are being referred to at the number listed above and reschedule the appointment.     It is important for you to keep your scheduled appointments. We are here to make sure you are given good patient care. If you have questions or you have made changes to your appointment, please notify us at  [phone] , ask for [facility] or Aram Beecham.    Thank you,  Patient Care Coordinator [place] at [facility]

## 2010-04-14 NOTE — Letter (Signed)
Summary: Gwenlyn Fudge MD  Gwenlyn Fudge MD   Imported By: Lanelle Bal 05/20/2009 09:16:16  _____________________________________________________________________  External Attachment:    Type:   Image     Comment:   External Document

## 2010-04-14 NOTE — Miscellaneous (Signed)
  Clinical Lists Changes  Medications: Added new medication of ERGOCALCIFEROL 50000 UNIT CAPS (ERGOCALCIFEROL) Take 1 capsule by mouth once a week for 8 weeks. - Signed Rx of ERGOCALCIFEROL 50000 UNIT CAPS (ERGOCALCIFEROL) Take 1 capsule by mouth once a week for 8 weeks.;  #8 x 0;  Signed;  Entered by: Delilah Shan CMA (AAMA);  Authorized by: Crawford Givens MD;  Method used: Electronically to [facility] [address], [place], [place], [place]  [address], Ph: [phone], Fax: [phone]    Prescriptions: ERGOCALCIFEROL 50000 UNIT CAPS (ERGOCALCIFEROL) Take 1 capsule by mouth once a week for 8 weeks.  #8 x 0   Entered by:   Delilah Shan CMA (AAMA)   Authorized by:   Crawford Givens MD   Signed by:   Delilah Shan CMA (AAMA) on 09/29/2009   Method used:   [facility]ronically to        [facility] [address]retail)       [address], [address]       [place], [place]  [address]       Ph: [phone]       Fax: [phone]   RxID:   [id number]

## 2010-04-15 NOTE — Letter (Signed)
Summary: Out of Work  [facility]  [address] [place], [place] [address]   Phone: [phone]  Fax: [phone]    February 23, 2010   Employee:  [name]    To Whom It May Concern:   For Medical reasons, please excuse the above named employee from work for the following dates:  Start:  February 22, 2010   End:  February 25, 2010 (may return on 02-26-10 if well)  If you need additional information, please feel free to contact our office.         Sincerely,     Selena Batten Dance CMA (AAMA)
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
## 2010-05-10 ENCOUNTER — Encounter: Payer: Self-pay | Admitting: Family Medicine

## 2010-05-10 ENCOUNTER — Ambulatory Visit (INDEPENDENT_AMBULATORY_CARE_PROVIDER_SITE_OTHER): Payer: Medicare PPO | Admitting: Family Medicine

## 2010-05-10 DIAGNOSIS — R300 Dysuria: Secondary | ICD-10-CM

## 2010-05-10 LAB — CONVERTED CEMR LAB
Nitrite: NEGATIVE
Urobilinogen, UA: 0.2
WBC Urine, dipstick: NEGATIVE

## 2010-05-11 ENCOUNTER — Encounter: Payer: Self-pay | Admitting: Family Medicine

## 2010-05-20 NOTE — Assessment & Plan Note (Signed)
Summary: BLADDER/KIDNEY PROBLEM/RBH   Vital Signs:  Patient profile:   75 year old female Height:      66 inches Weight:      214.25 pounds BMI:     34.71 Temp:     98.6 degrees F oral Pulse rate:   72 / minute Pulse rhythm:   regular BP sitting:   142 / 66  (left arm) Cuff size:   large  Vitals Entered By: Delilah Shan CMA Beckham Buxbaum Dull) (May 10, 2010 11:57 AM) CC: Bladder/kidney problems   History of Present Illness: Leaking urine.  Can't control urine.  Sx started a few weeks ago.  No burning.  No itching.  She feels like she has incomplete voiding.  Some incontinence with cough/sneezing. No fevers. Occ lower back pain.    Has follow up with cards in Spring 2012. Feeling well o/w .  Allergies: 1)  ! Penicillin V Potassium (Penicillin V Potassium)  Social History: Marital Status: Married: since 49 LIVES WITH HUSBAND Children: 5 kids, 1 on disabilty and lives at home- 13 GRANDCHILDREN, and 14 great grandkids Occupation: [employer]  Likes work in [employer] garden no tob, no alcohol  Review of Systems       See HPI.  Otherwise negative.    Physical Exam  General:  no apparent distress normocephalic atraumatic mucous membranes moist regular rate and rhythm clear to auscultation bilaterally ext w/o edema back w/o cva pain abdomen w/o suprapubic tenderness   Impression & Recommendations:  Problem # 1:  DYSURIA (ICD-788.1) I d/w patient VW:[id number] and starting septra for presumed cystitis.  follow up as needed.  She agrees.  check ucx.  See notes on this when resulted.   Her updated medication list for this problem includes:    Septra Ds 800-160 Mg Tabs (Sulfamethoxazole-trimethoprim) .Marland Kitchen... 1 by mouth two times a day  Orders: Prescription Created Electronically [id number]) Specimen Handling ([id number]) T-Culture, Urine ([id number])  Complete Medication List: 1)  Aspir-low 81 Mg Tbec (Aspirin) .Marland Kitchen.. 1 daily by mouth 2)  Atenolol 25 Mg  Tabs (Atenolol) .Marland Kitchen.. 1 daily by mouth 3)  Lipitor 80 Mg Tabs (Atorvastatin calcium) .... One ntab by mouth at night. 4)  Furosemide 20 Mg Tabs (Furosemide) .... One tab by mouth in am 5)  Zoloft 50 Mg Tabs (Sertraline hcl) .... Take 1 tab every day 6)  Taztia Xt 240 Mg Cp24 (Diltiazem hcl er beads) .Marland Kitchen.. 1 tablet daily 7)  Lansoprazole 15 Mg Cpdr (Lansoprazole) .... One tab by mouth 45 mins before brfst. 8)  Vicodin 5-500 Mg Tabs (Hydrocodone-acetaminophen) .... One tab by mouth every 4 hrs. 9)  Vitamin D 1000 Unit Tabs (Cholecalciferol) .... Take one by mouth two times a day 10)  Promethazine Hcl 25 Mg Tabs (Promethazine hcl) .... Take one by mouth q6 hours as needed nausea 11)  Septra Ds 800-160 Mg Tabs (Sulfamethoxazole-trimethoprim) .Marland Kitchen.. 1 by mouth two times a day  Other Orders: UA Dipstick w/o Micro (manual) ([id number])  Patient Instructions: 1)  Start the antibiotics today and use the exercises to control your urine.  Do the exercises several times a day.  We'll check other tests on your urine and let you know if you need to change antibiotics.  Take care.  Let us know if you aren't getting better.   Prescriptions: SEPTRA DS 800-160 MG TABS (SULFAMETHOXAZOLE-TRIMETHOPRIM) 1 by mouth two times a day  #6 x 0   Entered and Authorized by:   Crawford Givens MD  Signed by:   Crawford Givens MD on 05/10/2010   Method used:   Electronically to        [facility] [address] (retail)       [address], [address]       [place], [place]  [address]       Ph: [phone]       Fax: [phone]   RxID:   [id number]    Orders Added: 1)  UA Dipstick w/o Micro (manual) [[id number]] 2)  Est. Patient Level III [23557] 3)  Prescription Created Electronically [[id number]] 4)  Specimen Handling [[id number]] 5)  T-Culture, Urine [[id number]]    Current Allergies (reviewed today): ! PENICILLIN V POTASSIUM (PENICILLIN V POTASSIUM)  Laboratory Results   Urine Tests  Date/Time  Received: May 10, 2010 11:58 AM   Routine Urinalysis   Color: orange Appearance: Clear Glucose: negative   (Normal Range: Negative) Bilirubin: small   (Normal Range: Negative) Ketone: negative   (Normal Range: Negative) Spec. Gravity: 1.025   (Normal Range: 1.003-1.035) Blood: negative   (Normal Range: Negative) pH: 6.0   (Normal Range: 5.0-8.0) Protein: negative   (Normal Range: Negative) Urobilinogen: 0.2   (Normal Range: 0-1) Nitrite: negative   (Normal Range: Negative) Leukocyte Esterace: negative   (Normal Range: Negative)

## 2010-05-24 ENCOUNTER — Telehealth: Payer: Self-pay | Admitting: Family Medicine

## 2010-05-29 ENCOUNTER — Encounter: Payer: Self-pay | Admitting: Family Medicine

## 2010-06-01 NOTE — Progress Notes (Signed)
Summary: not any better   Phone Note Call from Patient Call back at Home Phone [phone]   Caller: Patient Call For: Crawford Givens MD Summary of Call: Patient says that she is still having trouble with her bladder, having urinary frequency, can't control it, has been trying the exercises as you suggested, finished antibiotic, there in no burning, back pain is better. [facility] [address]  Initial call taken by: Melody Comas,  May 24, 2010 3:25 PM  Follow-up for Phone Call        have patient try the oxybutynin and see if that helps her symptoms.  I would start at 1/2 tab by mouth two times a day.  Her her call back with an update after 2-3 weeks on the medicine, sooner if needed.  This may help her get some more control over her bladder symptoms.  If she agrees, please call it in.  Oxybutynin (immediate release) 5mg , 1/2 tab by mouth two times a day.  #30, 1rf.  thanks.  Follow-up by: Crawford Givens MD,  May 24, 2010 5:41 PM  Additional Follow-up for Phone Call Additional follow up Details #1::        Patient Advised.   Patient is agreeable to starting medication.  Medication phoned to pharmacy and placed on meds list.  Delilah Shan CMA Duncan Dull)  May 25, 2010 8:32 AM     New/Updated Medications: OXYBUTYNIN CHLORIDE 5 MG TABS (OXYBUTYNIN CHLORIDE) Take 1/2 tablet  by mouth two times a day. Prescriptions: OXYBUTYNIN CHLORIDE 5 MG TABS (OXYBUTYNIN CHLORIDE) Take 1/2 tablet  by mouth two times a day.  #30 x 1   Entered by:   Delilah Shan CMA (AAMA)   Authorized by:   Crawford Givens MD   Signed by:   Delilah Shan CMA (AAMA) on 05/25/2010   Method used:   Electronically to        [facility] [address] (retail)       [address]       [place], [place]  [address]       Ph: [phone]       Fax: [phone]   RxID:   [id number]

## 2010-06-07 ENCOUNTER — Telehealth: Payer: Self-pay | Admitting: *Deleted

## 2010-06-07 DIAGNOSIS — R300 Dysuria: Secondary | ICD-10-CM | POA: Insufficient documentation

## 2010-06-07 NOTE — Telephone Encounter (Signed)
Patient says that she is hurting really bad in her side and back, she has been taking the oxybutynin as directed, but it isn't helping at all. She is asking if she should be referred to urologist or what other suggestions you may have. Please advise. Uses [facility] [address] If needed.

## 2010-06-24 ENCOUNTER — Other Ambulatory Visit: Payer: Self-pay | Admitting: Family Medicine

## 2010-06-24 ENCOUNTER — Ambulatory Visit: Payer: Self-pay | Admitting: Urology

## 2010-07-08 ENCOUNTER — Ambulatory Visit: Payer: Self-pay | Admitting: Urology

## 2010-07-13 DIAGNOSIS — I82402 Acute embolism and thrombosis of unspecified deep veins of left lower extremity: Secondary | ICD-10-CM

## 2010-07-13 HISTORY — PX: WRIST FRACTURE SURGERY: SHX121

## 2010-07-13 HISTORY — DX: Acute embolism and thrombosis of unspecified deep veins of left lower extremity: I82.402

## 2010-07-14 ENCOUNTER — Other Ambulatory Visit: Payer: Self-pay | Admitting: *Deleted

## 2010-07-15 ENCOUNTER — Telehealth: Payer: Self-pay | Admitting: *Deleted

## 2010-07-15 NOTE — Telephone Encounter (Signed)
Opened in error

## 2010-07-27 ENCOUNTER — Other Ambulatory Visit: Payer: Self-pay | Admitting: Family Medicine

## 2010-08-03 ENCOUNTER — Other Ambulatory Visit (INDEPENDENT_AMBULATORY_CARE_PROVIDER_SITE_OTHER): Payer: Medicare PPO | Admitting: Family Medicine

## 2010-08-03 ENCOUNTER — Other Ambulatory Visit: Payer: Self-pay | Admitting: Family Medicine

## 2010-08-03 ENCOUNTER — Encounter: Payer: Medicare PPO | Admitting: Family Medicine

## 2010-08-03 DIAGNOSIS — E559 Vitamin D deficiency, unspecified: Secondary | ICD-10-CM

## 2010-08-03 DIAGNOSIS — E7800 Pure hypercholesterolemia, unspecified: Secondary | ICD-10-CM

## 2010-08-03 LAB — LIPID PANEL
Cholesterol: 154 mg/dL (ref 0–200)
HDL: 54.8 mg/dL (ref >39.00)
VLDL: 13.6 mg/dL (ref 0.0–40.0)

## 2010-08-03 LAB — COMPREHENSIVE METABOLIC PANEL
ALT: 17 U/L (ref 0–35)
CO2: 29 mEq/L (ref 19–32)
Creatinine, Ser: 0.6 mg/dL (ref 0.4–1.2)
GFR: 139.5 mL/min (ref >60.00)
Total Bilirubin: 0.5 mg/dL (ref 0.3–1.2)

## 2010-08-04 LAB — VITAMIN D 25 HYDROXY (VIT D DEFICIENCY, FRACTURES): Vit D, 25-Hydroxy: 27 ng/mL — ABNORMAL LOW (ref 30–89)

## 2010-08-10 ENCOUNTER — Ambulatory Visit (INDEPENDENT_AMBULATORY_CARE_PROVIDER_SITE_OTHER): Payer: Medicare PPO | Admitting: Family Medicine

## 2010-08-10 ENCOUNTER — Encounter: Payer: Self-pay | Admitting: Family Medicine

## 2010-08-10 DIAGNOSIS — M1990 Unspecified osteoarthritis, unspecified site: Secondary | ICD-10-CM

## 2010-08-10 DIAGNOSIS — M25569 Pain in unspecified knee: Secondary | ICD-10-CM

## 2010-08-10 DIAGNOSIS — Z13820 Encounter for screening for osteoporosis: Secondary | ICD-10-CM

## 2010-08-10 DIAGNOSIS — I259 Chronic ischemic heart disease, unspecified: Secondary | ICD-10-CM

## 2010-08-10 DIAGNOSIS — E785 Hyperlipidemia, unspecified: Secondary | ICD-10-CM

## 2010-08-10 DIAGNOSIS — E559 Vitamin D deficiency, unspecified: Secondary | ICD-10-CM

## 2010-08-10 DIAGNOSIS — I10 Essential (primary) hypertension: Secondary | ICD-10-CM

## 2010-08-10 DIAGNOSIS — Z1211 Encounter for screening for malignant neoplasm of colon: Secondary | ICD-10-CM

## 2010-08-10 MED ORDER — HYDROCODONE-ACETAMINOPHEN 5-500 MG PO TABS: 1.0000 | ORAL_TABLET | Freq: Four times a day (QID) | ORAL | Status: DC | PRN

## 2010-08-10 NOTE — Progress Notes (Signed)
H/o nephrolithiasis and seen by URO. With some stomach upset due to pain this AM, likely due to that.  Similar to prev episodes.  R sided renal colic pain that is intermittent, resolved now.  Her nocturia is better now.  She ran out of the vicodin.    Hypertension:    Using medication without problems or lightheadedness: yes Chest pain with exertion:no Edema:no Short of breath:no  Elevated Cholesterol: Using medications without problems:yes Muscle aches: no  B knee pain.  L > R, likely OA.  L knee occ puffy.  She was asking about PT.    H/o low vit D, d/w pt.  Labs d/w pt.    Colon CA screening.  Neg FH, dw pt.   PMH and SH reviewed.   Vital signs, Meds and allergies reviewed.  ROS: See HPI.  Otherwise nontributory.   GEN: nad, alert and oriented HEENT: mucous membranes moist, poor dentition (she's working on dental f/u) NECK: supple w/o LA CV: rrr PULM: ctab, no inc wob ABD: soft, +bs EXT: no edema SKIN: no acute rash

## 2010-08-10 NOTE — Patient Instructions (Signed)
Check with your insurance to see if they will cover the shingles shot. See Shirlee Limerick about your referral before your leave today. Start back on the vitamin D, taking it daily.   I was glad to see you today.  Take care.

## 2010-08-11 ENCOUNTER — Encounter: Payer: Self-pay | Admitting: Family Medicine

## 2010-08-12 DIAGNOSIS — Z1211 Encounter for screening for malignant neoplasm of colon: Secondary | ICD-10-CM | POA: Insufficient documentation

## 2010-08-12 NOTE — Assessment & Plan Note (Signed)
No CP, continue secondary prevention.

## 2010-08-12 NOTE — Assessment & Plan Note (Signed)
D/w patient re:options for colon cancer screening, including IFOB vs. colonoscopy.  Risks and benefits of both were discussed and patient voiced understanding.  Pt elects for:IFOB  

## 2010-08-12 NOTE — Assessment & Plan Note (Signed)
Labs d/w pt, no change in meds.  

## 2010-08-12 NOTE — Assessment & Plan Note (Signed)
B knees with audible crepitus and L knee slightly puffy with medial joint line tenderness.  No instability or weakness of the knee.  I think PT is reasonable and will refer.

## 2010-08-12 NOTE — Assessment & Plan Note (Signed)
BP okay for now, no change in meds.  Difficult to lose weight and exercise with knee pain, see above.

## 2010-08-12 NOTE — Assessment & Plan Note (Signed)
She'll start back on replacement and we'll refer for DXA.

## 2010-08-12 NOTE — Assessment & Plan Note (Signed)
Refer for dxa.

## 2010-08-17 ENCOUNTER — Ambulatory Visit: Payer: Self-pay | Admitting: Family Medicine

## 2010-08-23 ENCOUNTER — Encounter: Payer: Self-pay | Admitting: Family Medicine

## 2010-08-23 DIAGNOSIS — M810 Age-related osteoporosis without current pathological fracture: Secondary | ICD-10-CM | POA: Insufficient documentation

## 2010-08-24 ENCOUNTER — Other Ambulatory Visit: Payer: Self-pay | Admitting: Family Medicine

## 2010-08-24 ENCOUNTER — Other Ambulatory Visit: Payer: Medicare PPO

## 2010-08-24 DIAGNOSIS — Z1289 Encounter for screening for malignant neoplasm of other sites: Secondary | ICD-10-CM

## 2010-08-25 ENCOUNTER — Encounter: Payer: Self-pay | Admitting: *Deleted

## 2010-08-27 ENCOUNTER — Ambulatory Visit: Payer: Medicare PPO | Admitting: Family Medicine

## 2010-09-01 ENCOUNTER — Encounter: Payer: Self-pay | Admitting: Family Medicine

## 2010-09-13 ENCOUNTER — Other Ambulatory Visit: Payer: Self-pay | Admitting: *Deleted

## 2010-09-13 MED ORDER — ATORVASTATIN CALCIUM 80 MG PO TABS: 80.0000 mg | ORAL_TABLET | Freq: Every day | ORAL | Status: DC

## 2010-09-13 NOTE — Telephone Encounter (Signed)
Received faxed refill request from pharmacy. Refill sent to pharmacy. 

## 2010-09-23 ENCOUNTER — Encounter: Payer: Self-pay | Admitting: Family Medicine

## 2010-09-23 ENCOUNTER — Ambulatory Visit (INDEPENDENT_AMBULATORY_CARE_PROVIDER_SITE_OTHER)
Admission: RE | Admit: 2010-09-23 | Discharge: 2010-09-23 | Disposition: A | Discharge status: Home / Self Care | Payer: Medicare PPO | Source: Ambulatory Visit | Attending: Family Medicine | Admitting: Family Medicine

## 2010-09-23 ENCOUNTER — Ambulatory Visit (INDEPENDENT_AMBULATORY_CARE_PROVIDER_SITE_OTHER): Payer: Medicare PPO | Admitting: Family Medicine

## 2010-09-23 VITALS — BP 130/70 | HR 56 | Temp 98.5°F | Wt 214.4 lb

## 2010-09-23 DIAGNOSIS — M1712 Unilateral primary osteoarthritis, left knee: Secondary | ICD-10-CM

## 2010-09-23 DIAGNOSIS — M25569 Pain in unspecified knee: Secondary | ICD-10-CM

## 2010-09-23 DIAGNOSIS — M25562 Pain in left knee: Secondary | ICD-10-CM

## 2010-09-23 DIAGNOSIS — M8580 Other specified disorders of bone density and structure, unspecified site: Secondary | ICD-10-CM

## 2010-09-23 DIAGNOSIS — M899 Disorder of bone, unspecified: Secondary | ICD-10-CM

## 2010-09-23 HISTORY — DX: Unilateral primary osteoarthritis, left knee: M17.12

## 2010-09-23 MED ORDER — HYDROCODONE-ACETAMINOPHEN 5-500 MG PO TABS: 1.0000 | ORAL_TABLET | Freq: Four times a day (QID) | ORAL | Status: DC | PRN

## 2010-09-23 NOTE — Assessment & Plan Note (Signed)
>  25 min spent with face to face with patient, >50% counseling.  X ray reviewed and d/w pt.  Refer to ortho and use vicodin in meantime.  App ortho input.

## 2010-09-23 NOTE — Patient Instructions (Signed)
See Shirlee Limerick about your referral before your leave today (to get some help for your knee).  Take the pain medicine in the meantime.   Start back on the vitamin D.  I'll see your husband next week.   Take care.  Glad to see you.

## 2010-09-23 NOTE — Progress Notes (Signed)
Osteopenia.  We talked about this today, but tabled tx other than staring vit D due to her h/o GERD and her other concerns.  She's concerned about her husband, so we set up OV for him.  L knee pain.  Xray reviewed.  Pain with walking.  Some relief with sleeve and vicodin, but pain is getting worse.  Occ feels like giving way due to pain.  No recent injury.   Meds, vitals, and allergies reviewed.   ROS: See HPI.  Otherwise, noncontributory.  Nad, but worried about husband L knee puffy but not bruised.  Diffusely ttp on the joint lines with audible and palpable crepitus.  Able to bear weight, walks with limp.  Remainder of exam deferred for concern for comfort.

## 2010-09-23 NOTE — Assessment & Plan Note (Signed)
Her GERD sx may prevent oral bisphosphonate use.  Start vit D, address knee pain, and we can revisit this.

## 2010-10-27 ENCOUNTER — Ambulatory Visit (INDEPENDENT_AMBULATORY_CARE_PROVIDER_SITE_OTHER): Payer: Medicare PPO | Admitting: Family Medicine

## 2010-10-27 ENCOUNTER — Encounter: Payer: Self-pay | Admitting: Family Medicine

## 2010-10-27 DIAGNOSIS — M79643 Pain in unspecified hand: Secondary | ICD-10-CM | POA: Insufficient documentation

## 2010-10-27 DIAGNOSIS — J069 Acute upper respiratory infection, unspecified: Secondary | ICD-10-CM

## 2010-10-27 DIAGNOSIS — M79609 Pain in unspecified limb: Secondary | ICD-10-CM

## 2010-10-27 MED ORDER — GABAPENTIN 100 MG PO TABS: 100.0000 mg | ORAL_TABLET | Freq: Three times a day (TID) | ORAL | Status: DC

## 2010-10-27 MED ORDER — HYDROCODONE-ACETAMINOPHEN 5-500 MG PO TABS: 1.0000 | ORAL_TABLET | Freq: Four times a day (QID) | ORAL | Status: DC | PRN

## 2010-10-27 NOTE — Assessment & Plan Note (Signed)
I called Dr. Hyacinth Meeker.  I think the patient is in pain.  This may be carpal tunnel, but I'd like his input.  He'll see pt tomorrow at 1PM.  Continue prn vicodin and add on gabapentin with sedation caution.  She agrees.

## 2010-10-27 NOTE — Progress Notes (Signed)
R arm pain- fell and broke wrist.  Pain is worse after getting out of cast Monday.  Had been seeing Dr. Hyacinth Meeker with ortho in [place].  She had called about getting more pain meds, and has appointment for Tuesday.  She's in a spica now but the pain is worse in it.  She has burning in the hand and loss of sensation in the fingers.  I called Dr. Rondel Baton clinic and talked to him.  She may have carpal tunnel sx.  See plan.  URI sx, w/o fever.  Occ cough.  Some upper airway congestion.    Meds, vitals, and allergies reviewed.   ROS: See HPI.  Otherwise, noncontributory.  nad ncat Tm wnl Nasal exam with mild injection Op w/minimal irritation Neck supple rrr ctab r wrist diffusely ttp, distally with good cap refill but dec in sensation across the fingers.  Normal radial pulse.   rom at wrist limited by pain

## 2010-10-27 NOTE — Assessment & Plan Note (Signed)
Benign exam, supportive tx and f/u prn. She agrees.

## 2010-10-27 NOTE — Patient Instructions (Signed)
Take the gabapentin up to 3 times a day.  Start with 1 pill a day.  Go see Dr. Hyacinth Meeker tomorrow.  Get there at 12:45.  Take care.

## 2010-10-29 ENCOUNTER — Telehealth: Payer: Self-pay | Admitting: *Deleted

## 2010-10-29 NOTE — Telephone Encounter (Signed)
The patient says she went to Dr. Hyacinth Meeker for him to inject her hand with Cortisone and he did not even numb the area before he injected it.  She has had injections in her knees before and they spray a numbing med on before the injection.  She said this injection hurt so bad she could hardly stand it.  She believes it was because her appt was asked to be worked in and the MD took it out on her.   She said he mentioned surgery but that she will not allow him to do the surgery under any circumstances.  They did put her back in a cast.

## 2010-10-31 NOTE — Telephone Encounter (Signed)
Please call her, get an update on her pain, and see what her plans are.  Does she want to get a second opinion, and does she need help with this?  Thanks.

## 2010-11-01 NOTE — Telephone Encounter (Signed)
Noted, then I'll await input/update from her.

## 2010-11-01 NOTE — Telephone Encounter (Signed)
Patient says her hand is still hurting but not quite as bad.  She is going to start taking the medication that you gave her three times a day like you instructed.  She believes that is helping some.

## 2010-12-01 ENCOUNTER — Other Ambulatory Visit: Payer: Self-pay | Admitting: Family Medicine

## 2010-12-06 ENCOUNTER — Telehealth: Payer: Self-pay | Admitting: *Deleted

## 2010-12-06 NOTE — Telephone Encounter (Signed)
Pt is asking that you call her to discuss her pain medicine. She would not give me any more details, said she wants to talk to you because you are her doctor.

## 2010-12-07 NOTE — Telephone Encounter (Signed)
Left message with daughter to call back.  

## 2010-12-07 NOTE — Telephone Encounter (Signed)
Please call her and get some details since I'm not in the office until this afternoon. Thanks.

## 2010-12-09 ENCOUNTER — Other Ambulatory Visit: Payer: Self-pay | Admitting: *Deleted

## 2010-12-09 NOTE — Telephone Encounter (Signed)
Received faxed refill request from pharmacy. Is it okay to refill medication? 

## 2010-12-10 MED ORDER — GABAPENTIN 300 MG PO CAPS: 300.0000 mg | ORAL_CAPSULE | Freq: Three times a day (TID) | ORAL | Status: DC

## 2010-12-10 MED ORDER — SERTRALINE HCL 50 MG PO TABS: 50.0000 mg | ORAL_TABLET | Freq: Every day | ORAL | Status: DC

## 2010-12-10 NOTE — Telephone Encounter (Signed)
Patient advised as instructed via telephone she is taking Gabapentin 100mg  three times daily now with not much relief.  Will sent in Rx to [facility] for Gabapentin 300mg  one tablet three times daily.  Patient will let you know how her pain is doing.

## 2010-12-10 NOTE — Telephone Encounter (Signed)
Sent!

## 2010-12-10 NOTE — Telephone Encounter (Signed)
I want her to try to increase the gabapentin.  If she isn't taking 300mg  already, then increase up to 300mg  total in a day.  If the 300mg  isn't helping, then I please send in a new rx for 300mg  tabs (instead of the 100mg s), taking it initially once a day. Increase to 1 tab 3 times a day, ie 900mg  total in a day.  See if that helps with the pain.  Thanks.

## 2010-12-10 NOTE — Telephone Encounter (Signed)
Spoke with patient and she stated that she would like Dr. Para March to give her something stronger for pain.  She is on Hydrocodone/APAP 5/500 and Gabapentin but she stated that the Hydrocodone was working but it isn't anymore.  She saw Dr. Hyacinth Meeker, ortho and he gave her pain medication as well Hydrocodone/APAP 5/325 but that isn't helping.  She stated that she will have to have surgery on her hand but they have not set a date.  Please advise.

## 2011-01-11 ENCOUNTER — Ambulatory Visit: Payer: Self-pay | Admitting: Urology

## 2011-01-11 ENCOUNTER — Other Ambulatory Visit: Payer: Self-pay | Admitting: Family Medicine

## 2011-01-23 ENCOUNTER — Encounter: Payer: Self-pay | Admitting: Family Medicine

## 2011-02-23 ENCOUNTER — Other Ambulatory Visit: Payer: Self-pay | Admitting: Family Medicine

## 2011-04-20 ENCOUNTER — Encounter: Payer: Self-pay | Admitting: Orthopaedic Surgery

## 2011-05-03 ENCOUNTER — Encounter: Payer: Self-pay | Admitting: Family Medicine

## 2011-05-03 ENCOUNTER — Ambulatory Visit (INDEPENDENT_AMBULATORY_CARE_PROVIDER_SITE_OTHER): Payer: MEDICARE | Admitting: Family Medicine

## 2011-05-03 DIAGNOSIS — M549 Dorsalgia, unspecified: Secondary | ICD-10-CM

## 2011-05-03 NOTE — Patient Instructions (Signed)
Try taking the phenergan for the nausea.  I'll check with ortho.  Take up to 1.5 hydrocodone at a time.

## 2011-05-03 NOTE — Progress Notes (Signed)
She's in PT at [facility] for back pain.  It had started after a fall 09/2010, when she broke her R hand.  She was prev xrayed at ortho in [facility], w/o fx per patient. Is seeing [facility].  We're calling to request most recent records.   L buttock pain was getting worse, but is some better after PT.  Pain is better when walking.  Pain worse with sitting and laying down.  Radicular pain down to the L knee, posteriorly.   R hand is back in a cast per ortho.  She's worried about her husband who is ill. We talked about this at the OV.  She was having some nausea, likely related to pain and recent family stress.  She hasn't been taking promethazine.    Meds, vitals, and allergies reviewed.   ROS: See HPI.  Otherwise, noncontributory.  Nad, but tearful discussing her husband.  Mmm rrr ctab Back w/o midline pain L SLR positive but distally nv intact r hand casted

## 2011-05-04 DIAGNOSIS — M549 Dorsalgia, unspecified: Secondary | ICD-10-CM | POA: Insufficient documentation

## 2011-05-04 DIAGNOSIS — M545 Low back pain, unspecified: Secondary | ICD-10-CM | POA: Insufficient documentation

## 2011-05-04 NOTE — Assessment & Plan Note (Signed)
Continue current meds, restart phenergan, requesting records.  Okay for outpatient f/u.  She agrees with plan.  It appears that PT is helping. She is under a sig strain with her husband's illness. I offered my support. >25 min spent with face to face with patient, >50% counseling and/or coordinating care in total.

## 2011-05-09 ENCOUNTER — Telehealth: Payer: Self-pay | Admitting: Family Medicine

## 2011-05-09 NOTE — Telephone Encounter (Signed)
I called her.  She wanted to know if I'd heard anything from [facility] about [name]'s follow up for headaches.  I told her I didn't know and she'll call [facility] tomorrow if she doesn't hear.

## 2011-05-09 NOTE — Telephone Encounter (Signed)
To: [facility] (Daytime Triage) Fax: [phone] From: [facility] Date/ Time: 05/09/2011 3:18 PM Taken By: [place] Trimble, CSR Caller: Marylouise Stacks Facility: not collected Patient: [name], [name] DOB: May 07, 1935 Phone: [phone] Reason for Call: Pt states that someone from the office called her, she would like a call back Regarding Appointment: Appt Date: Appt Time: Unknown Provider: Reason: Details: Outcome:

## 2011-05-13 ENCOUNTER — Encounter: Payer: Self-pay | Admitting: Orthopaedic Surgery

## 2011-05-20 ENCOUNTER — Other Ambulatory Visit: Payer: Self-pay | Admitting: *Deleted

## 2011-05-20 MED ORDER — HYDROCODONE-ACETAMINOPHEN 5-500 MG PO TABS: 1.0000 | ORAL_TABLET | Freq: Four times a day (QID) | ORAL | Status: DC | PRN

## 2011-05-20 NOTE — Telephone Encounter (Signed)
She says she has a few sore places in her back and when she comes home from therapy, she feels good but then after a few hours, her back hurts really bad for the next few days and then it's time to go back to therapy again.  She wanted to make an appt with you so we scheduled it for Monday at 9:30.  She says she does not have any more pain medication.  She has used what she had remaining when she declined your offer at the last visit but now needs the medication, at least to get her by for the weekend until she sees you on Monday.

## 2011-05-20 NOTE — Telephone Encounter (Signed)
Medication phoned to pharmacy.  

## 2011-05-20 NOTE — Telephone Encounter (Signed)
Please call in.  Thanks.   

## 2011-05-20 NOTE — Telephone Encounter (Signed)
Patient called to request a Rx for pain medication for back pain.  She stated that Dr. Para March offered a Rx to her before but she refused it and now she needs it.  Please advise.  Uses [facility].

## 2011-05-20 NOTE — Telephone Encounter (Signed)
It was the vicodin for pain.  See if she still has some.  If not and if needed, then let me know.  Thanks.

## 2011-05-23 ENCOUNTER — Encounter: Payer: Self-pay | Admitting: Family Medicine

## 2011-05-23 ENCOUNTER — Ambulatory Visit (INDEPENDENT_AMBULATORY_CARE_PROVIDER_SITE_OTHER): Payer: MEDICARE | Admitting: Family Medicine

## 2011-05-23 DIAGNOSIS — M549 Dorsalgia, unspecified: Secondary | ICD-10-CM

## 2011-05-23 MED ORDER — HYDROCODONE-ACETAMINOPHEN 5-500 MG PO TABS: 1.0000 | ORAL_TABLET | Freq: Four times a day (QID) | ORAL | Status: DC | PRN

## 2011-05-23 NOTE — Patient Instructions (Signed)
Keep going with therapy, take the pain meds as needed and I'll call ortho.  Take care.

## 2011-05-23 NOTE — Progress Notes (Signed)
L sided back pain continues.  When she goes to therapy in AM, she feels better but then the pain returns later in the day.  She continues to have some L leg pain.  The pain meds were helping some, no full resolution of pain.  Pain 9/10 before --> 4/10 after with pain meds.  She's going back to see ortho after the therapy is finished.  Heat isn't helping much.  No ADE from the medicine.  Meds, vitals, and allergies reviewed.   ROS: See HPI.  Otherwise, noncontributory.  nad ncat Mmm rrr ctab Back w/o midline pain.  No paraspinal muscle tenderness in L spine B but L SI pain on testing.  Distally NV intact.

## 2011-05-24 NOTE — Assessment & Plan Note (Signed)
I called the ortho clinic [facility], M.D[facility] and left a message.  I would continue the vicodin and therapy for now.  Pt agrees. F/u prn.

## 2011-06-13 ENCOUNTER — Encounter: Payer: Self-pay | Admitting: Orthopaedic Surgery

## 2011-06-27 ENCOUNTER — Telehealth: Payer: Self-pay | Admitting: Family Medicine

## 2011-06-27 NOTE — Telephone Encounter (Signed)
Pt is calling to let Dr. Para March know she is going to have knee surgery soon. She isn't sure if she needs a referral. Pls call.

## 2011-06-27 NOTE — Telephone Encounter (Signed)
Called and LMOVM.  

## 2011-06-28 NOTE — Telephone Encounter (Signed)
She has f/u with [facility] on Monday.  They'll likely do her preop eval.  She has L knee surgery pending with Dr. Dion Saucier after that.  I'll await papers from both clinics.

## 2011-07-13 ENCOUNTER — Other Ambulatory Visit: Payer: Self-pay

## 2011-07-13 MED ORDER — HYDROCODONE-ACETAMINOPHEN 5-500 MG PO TABS: 1.0000 | ORAL_TABLET | Freq: Four times a day (QID) | ORAL | Status: DC | PRN

## 2011-07-13 NOTE — Telephone Encounter (Signed)
[facility] [address] faxed refill request Hydrocodone/APAP 5-500 mg. Pt last seen 05/23/11. Refill last done 06/26/11.Please advise.

## 2011-07-13 NOTE — Telephone Encounter (Signed)
Please call in

## 2011-07-14 NOTE — Telephone Encounter (Signed)
Medication phoned to pharmacy.  

## 2011-07-15 ENCOUNTER — Telehealth: Payer: Self-pay | Admitting: Family Medicine

## 2011-07-15 NOTE — Telephone Encounter (Signed)
We need the records from Dr. Lady Gary and can use that to assess her for surgery.

## 2011-07-15 NOTE — Telephone Encounter (Signed)
Patient saw Dr.Fath for clearance for knee surgery.  He cleared her for the knee surgery.  When patient spoke to [place], Dr.Landau's office, she said they need records from Dr.Duncan for clearance for surgery.

## 2011-07-18 ENCOUNTER — Encounter: Payer: Self-pay | Admitting: *Deleted

## 2011-07-18 ENCOUNTER — Telehealth: Payer: Self-pay | Admitting: Family Medicine

## 2011-07-18 NOTE — Telephone Encounter (Signed)
I talked with Dr. Lady Gary.  Per his report, patient is low risk for surgery.  I will defer to his opinion. Please notify ortho.

## 2011-07-18 NOTE — Telephone Encounter (Signed)
Dr. Para March spoke to Dr. Lady Gary on the phone and patient is considered low risk for surgery.  Letter sent to Orthopedics.

## 2011-07-18 NOTE — Telephone Encounter (Signed)
Message copied by Lars Mage on Mon Jul 18, 2011  8:46 AM ------      Message from: LAWS, REGINA C      Created: Wed Jul 13, 2011  3:07 PM      Contact: Sanda Klein       Spoke to Lupita Leash at [facility] [phone]) and was advised that Dr. Lady Gary has left the office for the day and she will have him call you tomorrow to discuss this. Rene Kocher      ----- Message -----         From: Joaquim Nam, MD         Sent: 07/13/2011  12:46 PM           To: Annamarie Major, CMA            I need input from [facility] cards since she has prev LAD stent.  Please notify that clinic, see if they will need to see patient.  Thanks.        ----- Message -----         From: Ulice Bold         Sent: 07/12/2011   4:48 PM           To: Joaquim Nam, MD            Needs Dr Para March to fax an authorization to [facility] so patient can have her knee replacement Surgery.  Fax number [phone]

## 2011-07-18 NOTE — Telephone Encounter (Signed)
Letter sent to Orthopedics.

## 2011-07-20 ENCOUNTER — Telehealth: Payer: Self-pay | Admitting: Family Medicine

## 2011-07-20 NOTE — Telephone Encounter (Signed)
Please call pt.  I talked with Dr. Lady Gary. Per his report, patient is low risk for surgery. I agree with him.  Letter was sent to ortho.  Thanks.

## 2011-07-20 NOTE — Telephone Encounter (Signed)
Caller: [name]/Patient; Phone Number: [phone]; Message from caller: Patient has had a referral for knee surgery, She is making sure that Dr.  Para March has cleared this and that she is okay to to have the surgery and that he has signed paper work.

## 2011-07-20 NOTE — Telephone Encounter (Signed)
Patient notified

## 2011-07-21 ENCOUNTER — Encounter (HOSPITAL_COMMUNITY): Payer: Self-pay | Admitting: Pharmacy Technician

## 2011-07-22 ENCOUNTER — Other Ambulatory Visit: Payer: Self-pay | Admitting: Orthopedic Surgery

## 2011-07-27 ENCOUNTER — Encounter (HOSPITAL_COMMUNITY): Payer: Self-pay

## 2011-07-27 ENCOUNTER — Encounter (HOSPITAL_COMMUNITY)
Admission: RE | Admit: 2011-07-27 | Discharge: 2011-07-27 | Disposition: A | Discharge status: Home / Self Care | Payer: Medicare Other | Source: Ambulatory Visit | Attending: Orthopedic Surgery | Admitting: Orthopedic Surgery

## 2011-07-27 HISTORY — DX: Effusion, unspecified joint: M25.40

## 2011-07-27 HISTORY — DX: Major depressive disorder, single episode, unspecified: F32.9

## 2011-07-27 HISTORY — DX: Pneumonia, unspecified organism: J18.9

## 2011-07-27 HISTORY — DX: Frequency of micturition: R35.0

## 2011-07-27 HISTORY — DX: Pain in unspecified joint: M25.50

## 2011-07-27 HISTORY — DX: Localized edema: R60.0

## 2011-07-27 HISTORY — DX: Depression, unspecified: F32.A

## 2011-07-27 HISTORY — DX: Dorsalgia, unspecified: M54.9

## 2011-07-27 HISTORY — DX: Edema, unspecified: R60.9

## 2011-07-27 LAB — URINALYSIS, ROUTINE W REFLEX MICROSCOPIC
Nitrite: NEGATIVE
Specific Gravity, Urine: 1.015 (ref 1.005–1.030)
Urobilinogen, UA: 0.2 mg/dL (ref 0.0–1.0)

## 2011-07-27 LAB — CBC
Hemoglobin: 12.8 g/dL (ref 12.0–15.0)
MCH: 28.6 pg (ref 26.0–34.0)
MCV: 86.1 fL (ref 78.0–100.0)
Platelets: 276 10*3/uL (ref 150–400)
RBC: 4.47 MIL/uL (ref 3.87–5.11)

## 2011-07-27 LAB — TYPE AND SCREEN: Antibody Screen: NEGATIVE

## 2011-07-27 LAB — BASIC METABOLIC PANEL
CO2: 30 mEq/L (ref 19–32)
Calcium: 9.6 mg/dL (ref 8.4–10.5)
Glucose, Bld: 104 mg/dL — ABNORMAL HIGH (ref 70–99)
Sodium: 140 mEq/L (ref 135–145)

## 2011-07-27 LAB — SURGICAL PCR SCREEN
MRSA, PCR: NEGATIVE
Staphylococcus aureus: POSITIVE — AB

## 2011-07-27 NOTE — Pre-Procedure Instructions (Signed)
[address] [facility] [address]IR  07/27/2011   Your procedure is scheduled on:  Tues, May 21  @ 11:35 AM  Report to [facility] at 9:30 AM.  Call this number if you have problems the morning of surgery: [phone]   Remember:   Do not eat food:After Midnight.  May have clear liquids: up to 4 Hours before arrival.(until 5:30 am)  Clear liquids include soda, tea, black coffee, apple or grape juice, broth,water  Take these medicines the morning of surgery with A SIP OF WATER: Zoloft,Prevacid,Pain Pill(if needed),Diltiazem,and Atenolol   Do not wear jewelry, make-up or nail polish.  Do not wear lotions, powders, or perfumes.   Do not shave 48 hours prior to surgery.   Do not bring valuables to the hospital.  Contacts, dentures or bridgework may not be worn into surgery.  Leave suitcase in the car. After surgery it may be brought to your room.  For patients admitted to the hospital, checkout time is 11:00 AM the day of discharge.   Special Instructions: CHG Shower Use Special Wash: 1/2 bottle night before surgery and 1/2 bottle morning of surgery.   Please read over the following fact sheets that you were given: Pain Management Information,Coughing and Deep Breathing,S&S of infection,Facts You Should Know if you were to need a Blood Transfusion

## 2011-07-27 NOTE — Progress Notes (Signed)
Dr.Foust is Cardiologist in [place] to request from Dr.Foust as well as EKG   Echo/Stress test done July 2012-to request from Dr.Foust  Medical MD is Dr.Duncan in [facility]

## 2011-07-28 NOTE — Consult Note (Signed)
Anesthesia Chart Review:  Patient is a 76 year old female scheduled for left TKA on 08/02/11.  History includes non-smoker, HTN, GERD, CAD s/p PCI, PNA, depression, back pain, HLD, peripheral edema.  PCP is Dr. Crawford Givens.    Cardiologist is Dr. Lady Gary at [facility].  He felt patient was low risk for this procedure.  She has a history of prior cardiac cath ( >10 year ago) showing 90% LAD and 60% RCA s/p PTCA/stent LAD.  Stress test on 09/30/10 showed no evidence of ischemia or dysrhythmia, normal LV function, EF 64%. EKG X 2 on 07/04/11 showed SB, baseline wanderer in I, II, or III.      Labs noted.  UA shows moderate leukocytes, negative nitrites.  Result called to [facility] at Dr. Shelba Flake office.  She will have his PA Apolinar Junes review since Dr. Dion Saucier is currently out of town.  CXR on 07/27/11 showed no acute infiltrate or pulmonary edema. Left basilar atelectasis or scarring.  If no significant change in her status, then plan to proceed.  Shonna Chock, PA-C

## 2011-08-01 ENCOUNTER — Encounter: Payer: Self-pay | Admitting: Family Medicine

## 2011-08-01 MED ORDER — CEFAZOLIN SODIUM-DEXTROSE 2-3 GM-% IV SOLR
2.0000 g | INTRAVENOUS | Status: DC
  Filled 2011-08-01: qty 50

## 2011-08-02 ENCOUNTER — Inpatient Hospital Stay (HOSPITAL_COMMUNITY): Payer: Medicare Other

## 2011-08-02 ENCOUNTER — Ambulatory Visit (HOSPITAL_COMMUNITY): Payer: Medicare Other | Admitting: Vascular Surgery

## 2011-08-02 ENCOUNTER — Inpatient Hospital Stay (HOSPITAL_COMMUNITY)
Admission: RE | Admit: 2011-08-02 | Discharge: 2011-08-05 | DRG: 470 | Disposition: A | Discharge status: Skilled Nursing Facility | Payer: Medicare Other | Source: Ambulatory Visit | Attending: Orthopedic Surgery | Admitting: Orthopedic Surgery

## 2011-08-02 ENCOUNTER — Encounter (HOSPITAL_COMMUNITY)
Admission: RE | Disposition: A | Discharge status: Skilled Nursing Facility | Payer: Self-pay | Source: Ambulatory Visit | Attending: Orthopedic Surgery

## 2011-08-02 ENCOUNTER — Encounter (HOSPITAL_COMMUNITY): Payer: Self-pay | Admitting: Orthopedic Surgery

## 2011-08-02 ENCOUNTER — Encounter (HOSPITAL_COMMUNITY): Payer: Self-pay | Admitting: Vascular Surgery

## 2011-08-02 DIAGNOSIS — Z79899 Other long term (current) drug therapy: Secondary | ICD-10-CM

## 2011-08-02 DIAGNOSIS — M1712 Unilateral primary osteoarthritis, left knee: Secondary | ICD-10-CM

## 2011-08-02 DIAGNOSIS — Z8249 Family history of ischemic heart disease and other diseases of the circulatory system: Secondary | ICD-10-CM

## 2011-08-02 DIAGNOSIS — K219 Gastro-esophageal reflux disease without esophagitis: Secondary | ICD-10-CM | POA: Diagnosis present

## 2011-08-02 DIAGNOSIS — E785 Hyperlipidemia, unspecified: Secondary | ICD-10-CM | POA: Diagnosis present

## 2011-08-02 DIAGNOSIS — M1710 Unilateral primary osteoarthritis, unspecified knee: Principal | ICD-10-CM | POA: Diagnosis present

## 2011-08-02 DIAGNOSIS — Z7901 Long term (current) use of anticoagulants: Secondary | ICD-10-CM

## 2011-08-02 DIAGNOSIS — Z7982 Long term (current) use of aspirin: Secondary | ICD-10-CM

## 2011-08-02 DIAGNOSIS — I2510 Atherosclerotic heart disease of native coronary artery without angina pectoris: Secondary | ICD-10-CM | POA: Diagnosis present

## 2011-08-02 DIAGNOSIS — Z880 Allergy status to penicillin: Secondary | ICD-10-CM

## 2011-08-02 DIAGNOSIS — F329 Major depressive disorder, single episode, unspecified: Secondary | ICD-10-CM | POA: Diagnosis present

## 2011-08-02 DIAGNOSIS — F3289 Other specified depressive episodes: Secondary | ICD-10-CM | POA: Diagnosis present

## 2011-08-02 DIAGNOSIS — Z9861 Coronary angioplasty status: Secondary | ICD-10-CM

## 2011-08-02 DIAGNOSIS — I10 Essential (primary) hypertension: Secondary | ICD-10-CM | POA: Diagnosis present

## 2011-08-02 DIAGNOSIS — D62 Acute posthemorrhagic anemia: Secondary | ICD-10-CM | POA: Diagnosis not present

## 2011-08-02 HISTORY — DX: Unilateral primary osteoarthritis, left knee: M17.12

## 2011-08-02 HISTORY — PX: TOTAL KNEE ARTHROPLASTY: SHX125

## 2011-08-02 SURGERY — ARTHROPLASTY, KNEE, TOTAL
Anesthesia: General | Site: Knee | Laterality: Left | Wound class: Clean

## 2011-08-02 MED ORDER — FENTANYL CITRATE 0.05 MG/ML IJ SOLN
50.0000 ug | INTRAMUSCULAR | Status: DC | PRN
  Administered 2011-08-02: 100 ug via INTRAVENOUS

## 2011-08-02 MED ORDER — ONDANSETRON HCL 4 MG PO TABS
4.0000 mg | ORAL_TABLET | Freq: Four times a day (QID) | ORAL | Status: DC | PRN
  Administered 2011-08-03 – 2011-08-04 (×2): 4 mg via ORAL
  Filled 2011-08-02 (×2): qty 1

## 2011-08-02 MED ORDER — POTASSIUM CHLORIDE IN NACL 20-0.45 MEQ/L-% IV SOLN
INTRAVENOUS | Status: DC
  Administered 2011-08-02: 18:00:00 via INTRAVENOUS
  Administered 2011-08-03: 1000 mL via INTRAVENOUS
  Filled 2011-08-02 (×8): qty 1000

## 2011-08-02 MED ORDER — WARFARIN - PHARMACIST DOSING INPATIENT: Freq: Every day | Status: DC

## 2011-08-02 MED ORDER — PROPOFOL 10 MG/ML IV EMUL
INTRAVENOUS | Status: DC | PRN
  Administered 2011-08-02: 20 mg via INTRAVENOUS
  Administered 2011-08-02: 30 mg via INTRAVENOUS
  Administered 2011-08-02: 120 mg via INTRAVENOUS

## 2011-08-02 MED ORDER — OXYCODONE HCL 5 MG PO TABS
ORAL_TABLET | ORAL | Status: AC
  Filled 2011-08-02: qty 2

## 2011-08-02 MED ORDER — WARFARIN SODIUM 5 MG PO TABS
5.0000 mg | ORAL_TABLET | Freq: Once | ORAL | Status: AC
  Administered 2011-08-02: 5 mg via ORAL
  Filled 2011-08-02: qty 1

## 2011-08-02 MED ORDER — LACTATED RINGERS IV SOLN
INTRAVENOUS | Status: DC
  Administered 2011-08-02: 11:00:00 via INTRAVENOUS

## 2011-08-02 MED ORDER — DROPERIDOL 2.5 MG/ML IJ SOLN: 0.6250 mg | INTRAMUSCULAR | Status: DC | PRN

## 2011-08-02 MED ORDER — HYDROMORPHONE HCL PF 1 MG/ML IJ SOLN
0.5000 mg | INTRAMUSCULAR | Status: DC | PRN
  Administered 2011-08-02 – 2011-08-03 (×6): 1 mg via INTRAVENOUS
  Filled 2011-08-02 (×6): qty 1

## 2011-08-02 MED ORDER — VANCOMYCIN HCL 1000 MG IV SOLR
1000.0000 mg | INTRAVENOUS | Status: DC | PRN
  Administered 2011-08-02: 1000 mg via INTRAVENOUS

## 2011-08-02 MED ORDER — METHOCARBAMOL 100 MG/ML IJ SOLN
500.0000 mg | INTRAVENOUS | Status: AC
  Administered 2011-08-02: 500 mg via INTRAVENOUS
  Filled 2011-08-02: qty 5

## 2011-08-02 MED ORDER — OXYCODONE HCL 5 MG PO TABS
5.0000 mg | ORAL_TABLET | ORAL | Status: DC | PRN
  Administered 2011-08-02 – 2011-08-05 (×6): 10 mg via ORAL
  Filled 2011-08-02 (×5): qty 2

## 2011-08-02 MED ORDER — METHOCARBAMOL 500 MG PO TABS
500.0000 mg | ORAL_TABLET | Freq: Four times a day (QID) | ORAL | Status: DC | PRN
  Administered 2011-08-03: 500 mg via ORAL
  Filled 2011-08-02: qty 1

## 2011-08-02 MED ORDER — VANCOMYCIN HCL IN DEXTROSE 1-5 GM/200ML-% IV SOLN
INTRAVENOUS | Status: AC
  Filled 2011-08-02: qty 200

## 2011-08-02 MED ORDER — ACETAMINOPHEN 325 MG PO TABS: 650.0000 mg | ORAL_TABLET | Freq: Four times a day (QID) | ORAL | Status: DC | PRN

## 2011-08-02 MED ORDER — ZOLPIDEM TARTRATE 5 MG PO TABS: 5.0000 mg | ORAL_TABLET | Freq: Every evening | ORAL | Status: DC | PRN

## 2011-08-02 MED ORDER — FENTANYL CITRATE 0.05 MG/ML IJ SOLN
INTRAMUSCULAR | Status: DC | PRN
  Administered 2011-08-02: 25 ug via INTRAVENOUS
  Administered 2011-08-02: 100 ug via INTRAVENOUS
  Administered 2011-08-02: 50 ug via INTRAVENOUS
  Administered 2011-08-02: 25 ug via INTRAVENOUS
  Administered 2011-08-02: 50 ug via INTRAVENOUS

## 2011-08-02 MED ORDER — ATENOLOL 25 MG PO TABS
25.0000 mg | ORAL_TABLET | Freq: Every day | ORAL | Status: DC
  Administered 2011-08-03: 25 mg via ORAL
  Filled 2011-08-02 (×3): qty 1

## 2011-08-02 MED ORDER — WARFARIN SODIUM 5 MG PO TABS: 5.0000 mg | ORAL_TABLET | Freq: Every day | ORAL | Status: DC

## 2011-08-02 MED ORDER — SODIUM CHLORIDE 0.9 % IR SOLN
Status: DC | PRN
  Administered 2011-08-02: 3000 mL

## 2011-08-02 MED ORDER — DILTIAZEM HCL ER 240 MG PO CP24
240.0000 mg | ORAL_CAPSULE | Freq: Every day | ORAL | Status: DC
  Administered 2011-08-03 – 2011-08-05 (×3): 240 mg via ORAL
  Filled 2011-08-02 (×3): qty 1

## 2011-08-02 MED ORDER — METOCLOPRAMIDE HCL 10 MG PO TABS
5.0000 mg | ORAL_TABLET | Freq: Three times a day (TID) | ORAL | Status: DC | PRN
Start: 2011-08-02 — End: 2011-08-05
  Administered 2011-08-04 – 2011-08-05 (×2): 10 mg via ORAL
  Filled 2011-08-02 (×2): qty 1

## 2011-08-02 MED ORDER — ATORVASTATIN CALCIUM 80 MG PO TABS
80.0000 mg | ORAL_TABLET | Freq: Every day | ORAL | Status: DC
  Administered 2011-08-02 – 2011-08-05 (×4): 80 mg via ORAL
  Filled 2011-08-02 (×4): qty 1

## 2011-08-02 MED ORDER — OXYCODONE-ACETAMINOPHEN 10-325 MG PO TABS: 1.0000 | ORAL_TABLET | Freq: Four times a day (QID) | ORAL | Status: AC | PRN

## 2011-08-02 MED ORDER — SERTRALINE HCL 50 MG PO TABS
50.0000 mg | ORAL_TABLET | Freq: Every day | ORAL | Status: DC
  Administered 2011-08-03 – 2011-08-05 (×3): 50 mg via ORAL
  Filled 2011-08-02 (×3): qty 1

## 2011-08-02 MED ORDER — ONDANSETRON HCL 4 MG/2ML IJ SOLN
INTRAMUSCULAR | Status: DC | PRN
  Administered 2011-08-02: 4 mg via INTRAVENOUS

## 2011-08-02 MED ORDER — PANTOPRAZOLE SODIUM 20 MG PO TBEC
20.0000 mg | DELAYED_RELEASE_TABLET | Freq: Every day | ORAL | Status: DC
  Administered 2011-08-02 – 2011-08-05 (×4): 20 mg via ORAL
  Filled 2011-08-02 (×4): qty 1

## 2011-08-02 MED ORDER — HYDROMORPHONE HCL PF 1 MG/ML IJ SOLN
0.2500 mg | INTRAMUSCULAR | Status: DC | PRN
  Administered 2011-08-02 (×4): 0.5 mg via INTRAVENOUS

## 2011-08-02 MED ORDER — MIDAZOLAM HCL 2 MG/2ML IJ SOLN
1.0000 mg | INTRAMUSCULAR | Status: DC | PRN
  Administered 2011-08-02: 1 mg via INTRAVENOUS

## 2011-08-02 MED ORDER — HYDROMORPHONE HCL PF 1 MG/ML IJ SOLN
INTRAMUSCULAR | Status: AC
  Administered 2011-08-02: 1 mg via INTRAVENOUS
  Filled 2011-08-02: qty 1

## 2011-08-02 MED ORDER — VITAMIN B-12 1000 MCG PO TABS
1000.0000 ug | ORAL_TABLET | Freq: Every day | ORAL | Status: DC
  Administered 2011-08-02 – 2011-08-05 (×4): 1000 ug via ORAL
  Filled 2011-08-02 (×4): qty 1

## 2011-08-02 MED ORDER — LACTATED RINGERS IV SOLN
INTRAVENOUS | Status: DC | PRN
  Administered 2011-08-02 (×2): via INTRAVENOUS

## 2011-08-02 MED ORDER — SODIUM CHLORIDE 0.9 % IV SOLN
INTRAVENOUS | Status: DC | PRN
  Administered 2011-08-02: 11:00:00 via INTRAVENOUS

## 2011-08-02 MED ORDER — OXYCODONE-ACETAMINOPHEN 5-325 MG PO TABS
1.0000 | ORAL_TABLET | ORAL | Status: DC | PRN
  Administered 2011-08-02: 2 via ORAL
  Administered 2011-08-02: 1 via ORAL
  Administered 2011-08-03 – 2011-08-05 (×5): 2 via ORAL
  Filled 2011-08-02 (×6): qty 2

## 2011-08-02 MED ORDER — 0.9 % SODIUM CHLORIDE (POUR BTL) OPTIME
TOPICAL | Status: DC | PRN
  Administered 2011-08-02: 1000 mL

## 2011-08-02 MED ORDER — VANCOMYCIN HCL IN DEXTROSE 1-5 GM/200ML-% IV SOLN
1000.0000 mg | Freq: Two times a day (BID) | INTRAVENOUS | Status: AC
  Administered 2011-08-02: 1000 mg via INTRAVENOUS
  Filled 2011-08-02: qty 200

## 2011-08-02 MED ORDER — PROMETHAZINE HCL 25 MG PO TABS: 25.0000 mg | ORAL_TABLET | Freq: Four times a day (QID) | ORAL | Status: AC | PRN

## 2011-08-02 MED ORDER — PATIENT'S GUIDE TO USING COUMADIN BOOK
Freq: Once | Status: AC
  Administered 2011-08-02: 18:00:00
  Filled 2011-08-02: qty 1

## 2011-08-02 MED ORDER — ONDANSETRON HCL 4 MG/2ML IJ SOLN: 4.0000 mg | Freq: Four times a day (QID) | INTRAMUSCULAR | Status: DC | PRN

## 2011-08-02 MED ORDER — METOCLOPRAMIDE HCL 5 MG/ML IJ SOLN: 5.0000 mg | Freq: Three times a day (TID) | INTRAMUSCULAR | Status: DC | PRN

## 2011-08-02 MED ORDER — LABETALOL HCL 5 MG/ML IV SOLN
INTRAVENOUS | Status: DC | PRN
  Administered 2011-08-02 (×2): 2.5 mg via INTRAVENOUS

## 2011-08-02 MED ORDER — WARFARIN VIDEO
Freq: Once | Status: AC
  Administered 2011-08-02: 18:00:00

## 2011-08-02 MED ORDER — ALUM & MAG HYDROXIDE-SIMETH 200-200-20 MG/5ML PO SUSP
30.0000 mL | ORAL | Status: DC | PRN
  Administered 2011-08-04 – 2011-08-05 (×2): 30 mL via ORAL
  Filled 2011-08-02 (×2): qty 30

## 2011-08-02 MED ORDER — METHOCARBAMOL 100 MG/ML IJ SOLN
500.0000 mg | Freq: Four times a day (QID) | INTRAVENOUS | Status: DC | PRN
  Filled 2011-08-02: qty 5

## 2011-08-02 MED ORDER — ACETAMINOPHEN 650 MG RE SUPP: 650.0000 mg | Freq: Four times a day (QID) | RECTAL | Status: DC | PRN

## 2011-08-02 MED ORDER — ENOXAPARIN SODIUM 30 MG/0.3ML ~~LOC~~ SOLN
30.0000 mg | Freq: Two times a day (BID) | SUBCUTANEOUS | Status: DC
  Administered 2011-08-03 – 2011-08-05 (×5): 30 mg via SUBCUTANEOUS
  Filled 2011-08-02 (×7): qty 0.3

## 2011-08-02 MED ORDER — FUROSEMIDE 20 MG PO TABS
20.0000 mg | ORAL_TABLET | Freq: Every day | ORAL | Status: DC
  Administered 2011-08-02 – 2011-08-03 (×2): 20 mg via ORAL
  Filled 2011-08-02 (×4): qty 1

## 2011-08-02 MED ORDER — FENTANYL CITRATE 0.05 MG/ML IJ SOLN
INTRAMUSCULAR | Status: AC
  Filled 2011-08-02: qty 2

## 2011-08-02 MED ORDER — PHENOL 1.4 % MT LIQD
1.0000 | OROMUCOSAL | Status: DC | PRN
  Administered 2011-08-03: 1 via OROMUCOSAL
  Filled 2011-08-02: qty 177

## 2011-08-02 MED ORDER — MIDAZOLAM HCL 2 MG/2ML IJ SOLN
INTRAMUSCULAR | Status: AC
  Filled 2011-08-02: qty 2

## 2011-08-02 MED ORDER — VITAMIN D3 25 MCG (1000 UNIT) PO TABS
1000.0000 [IU] | ORAL_TABLET | Freq: Two times a day (BID) | ORAL | Status: DC
  Administered 2011-08-02 – 2011-08-05 (×6): 1000 [IU] via ORAL
  Filled 2011-08-02 (×7): qty 1

## 2011-08-02 MED ORDER — VITAMIN C 500 MG PO TABS
500.0000 mg | ORAL_TABLET | Freq: Every day | ORAL | Status: DC
  Administered 2011-08-02 – 2011-08-05 (×4): 500 mg via ORAL
  Filled 2011-08-02 (×4): qty 1

## 2011-08-02 MED ORDER — MENTHOL 3 MG MT LOZG: 1.0000 | LOZENGE | OROMUCOSAL | Status: DC | PRN

## 2011-08-02 MED ORDER — DIPHENHYDRAMINE HCL 12.5 MG/5ML PO ELIX: 12.5000 mg | ORAL_SOLUTION | ORAL | Status: DC | PRN

## 2011-08-02 MED ORDER — DOCUSATE SODIUM 100 MG PO CAPS
100.0000 mg | ORAL_CAPSULE | Freq: Two times a day (BID) | ORAL | Status: DC
  Administered 2011-08-02 – 2011-08-05 (×6): 100 mg via ORAL
  Filled 2011-08-02 (×7): qty 1

## 2011-08-02 MED ORDER — SENNA 8.6 MG PO TABS
1.0000 | ORAL_TABLET | Freq: Two times a day (BID) | ORAL | Status: DC
  Administered 2011-08-02 – 2011-08-05 (×6): 8.6 mg via ORAL
  Filled 2011-08-02 (×7): qty 1

## 2011-08-02 SURGICAL SUPPLY — 66 items
APL SKNCLS STERI-STRIP NONHPOA (GAUZE/BANDAGES/DRESSINGS) ×1
BANDAGE ELASTIC 6 VELCRO ST LF (GAUZE/BANDAGES/DRESSINGS) ×4 IMPLANT
BANDAGE ESMARK 6X9 LF (GAUZE/BANDAGES/DRESSINGS) ×1 IMPLANT
BENZOIN TINCTURE PRP APPL 2/3 (GAUZE/BANDAGES/DRESSINGS) ×2 IMPLANT
BLADE SAG 18X100X1.27 (BLADE) ×2 IMPLANT
BLADE SAW RECIP 87.9 MT (BLADE) ×2 IMPLANT
BLADE SAW SGTL 13X75X1.27 (BLADE) ×2 IMPLANT
BNDG CMPR 9X6 STRL LF SNTH (GAUZE/BANDAGES/DRESSINGS) ×1
BNDG ESMARK 6X9 LF (GAUZE/BANDAGES/DRESSINGS) ×2
BOOTCOVER CLEANROOM LRG (PROTECTIVE WEAR) ×4 IMPLANT
BOWL SMART MIX CTS (DISPOSABLE) ×2 IMPLANT
CEMENT HV SMART SET (Cement) ×4 IMPLANT
CLOTH BEACON ORANGE TIMEOUT ST (SAFETY) ×2 IMPLANT
COVER SURGICAL LIGHT HANDLE (MISCELLANEOUS) ×3 IMPLANT
CUFF TOURNIQUET SINGLE 34IN LL (TOURNIQUET CUFF) ×2 IMPLANT
DRAPE EXTREMITY T 121X128X90 (DRAPE) ×2 IMPLANT
DRAPE PROXIMA HALF (DRAPES) ×2 IMPLANT
DRAPE U-SHAPE 47X51 STRL (DRAPES) ×2 IMPLANT
DRSG PAD ABDOMINAL 8X10 ST (GAUZE/BANDAGES/DRESSINGS) ×3 IMPLANT
DURAPREP 26ML APPLICATOR (WOUND CARE) ×2 IMPLANT
ELECT CAUTERY BLADE 6.4 (BLADE) ×2 IMPLANT
ELECT REM PT RETURN 9FT ADLT (ELECTROSURGICAL) ×2
ELECTRODE REM PT RTRN 9FT ADLT (ELECTROSURGICAL) ×1 IMPLANT
EVACUATOR 1/8 PVC DRAIN (DRAIN) IMPLANT
FACESHIELD LNG OPTICON STERILE (SAFETY) ×2 IMPLANT
GLOVE BIOGEL PI IND STRL 6 (GLOVE) ×1 IMPLANT
GLOVE BIOGEL PI IND STRL 7.0 (GLOVE) ×1 IMPLANT
GLOVE BIOGEL PI IND STRL 7.5 (GLOVE) ×1 IMPLANT
GLOVE BIOGEL PI IND STRL 8 (GLOVE) ×2 IMPLANT
GLOVE BIOGEL PI INDICATOR 6 (GLOVE) ×1
GLOVE BIOGEL PI INDICATOR 7.0 (GLOVE) ×1
GLOVE BIOGEL PI INDICATOR 7.5 (GLOVE) ×1
GLOVE BIOGEL PI INDICATOR 8 (GLOVE) ×2
GLOVE ORTHO TXT STRL SZ7.5 (GLOVE) ×2 IMPLANT
GLOVE SURG ORTHO 8.0 STRL STRW (GLOVE) ×2 IMPLANT
GLOVE SURG SS PI 6.0 STRL IVOR (GLOVE) ×2 IMPLANT
GLOVE SURG SS PI 6.5 STRL IVOR (GLOVE) ×2 IMPLANT
HANDPIECE INTERPULSE COAX TIP (DISPOSABLE) ×2
HOOD PEEL AWAY FACE SHEILD DIS (HOOD) ×4 IMPLANT
KIT BASIN OR (CUSTOM PROCEDURE TRAY) ×2 IMPLANT
KIT ROOM TURNOVER OR (KITS) ×2 IMPLANT
MANIFOLD NEPTUNE II (INSTRUMENTS) ×2 IMPLANT
NS IRRIG 1000ML POUR BTL (IV SOLUTION) ×2 IMPLANT
PACK TOTAL JOINT (CUSTOM PROCEDURE TRAY) ×2 IMPLANT
PAD ARMBOARD 7.5X6 YLW CONV (MISCELLANEOUS) ×4 IMPLANT
PAD CAST 4YDX4 CTTN HI CHSV (CAST SUPPLIES) IMPLANT
PADDING CAST COTTON 4X4 STRL (CAST SUPPLIES)
PADDING CAST COTTON 6X4 STRL (CAST SUPPLIES) ×2 IMPLANT
SET HNDPC FAN SPRY TIP SCT (DISPOSABLE) ×1 IMPLANT
SPONGE GAUZE 4X4 12PLY (GAUZE/BANDAGES/DRESSINGS) ×2 IMPLANT
STAPLER VISISTAT 35W (STAPLE) ×1 IMPLANT
STRIP CLOSURE SKIN 1/2X4 (GAUZE/BANDAGES/DRESSINGS) ×3 IMPLANT
SUCTION FRAZIER TIP 10 FR DISP (SUCTIONS) ×2 IMPLANT
SUT MNCRL AB 4-0 PS2 18 (SUTURE) IMPLANT
SUT VIC AB 0 CT1 27 (SUTURE) ×2
SUT VIC AB 0 CT1 27XBRD ANBCTR (SUTURE) ×1 IMPLANT
SUT VIC AB 2-0 CT1 27 (SUTURE) ×2
SUT VIC AB 2-0 CT1 TAPERPNT 27 (SUTURE) ×1 IMPLANT
SUT VIC AB 3-0 SH 18 (SUTURE) ×4 IMPLANT
SYR 30ML LL (SYRINGE) ×1 IMPLANT
TOWEL OR 17X24 6PK STRL BLUE (TOWEL DISPOSABLE) ×2 IMPLANT
TOWEL OR 17X26 10 PK STRL BLUE (TOWEL DISPOSABLE) ×2 IMPLANT
TRAY FOLEY CATH 14FR (SET/KITS/TRAYS/PACK) ×1 IMPLANT
TUBE CONNECTING 12X1/4 (SUCTIONS) ×2 IMPLANT
WATER STERILE IRR 1000ML POUR (IV SOLUTION) ×4 IMPLANT
YANKAUER SUCT BULB TIP NO VENT (SUCTIONS) ×1 IMPLANT

## 2011-08-02 NOTE — Discharge Instructions (Signed)
Total Knee Replacement In total knee replacement, the damaged knee is replaced with an artificial knee joint (prosthesis). The purpose of this surgery is to reduce pain and improve your range of motion. Regaining a near normal range of motion of your knee in the first 3 to 6 weeks after surgery is critical. Generally, these artificial joints last a minimum of 10 years. By that time, about 1 in 10 patients will need another surgery to repair the loose prosthesis. LET YOUR CAREGIVER KNOW ABOUT:   Allergies to food or medicine.   Medicines taken, including vitamins, herbs, eyedrops, over-the-counter medicines, and creams.   Use of steroids (by mouth or creams).   Previous problems with anesthetics or numbing medicines.   History of bleeding problems or blood clots.   Previous surgery.   Other health problems, including diabetes and kidney problems.   Possibility of pregnancy, if this applies.  RISKS AND COMPLICATIONS   Knee pain.   Loss of range of motion of the knee (stiffness) or instability.   Loosening of the prosthesis.   Infection.  BEFORE THE PROCEDURE   If you smoke, quit.   You may need a replacement or addition of blood (transfusion) during this procedure. You may want to donate your own blood for storage several weeks before the procedure. This way, your own blood can be stored and given to you if needed. Talk to your surgeon about this option.   Do not eat or drink anything for as long as directed by your caregiver before the procedure.   You may bathe and brush your teeth before the procedure. Do not swallow the water when brushing your teeth.   Scrub the area to be operated on for 5 minutes in the morning before the procedure. Use special soap if you are directed to do so by your caregiver.   Take your regular medicines with a small sip of water unless otherwise instructed. Your caregiver will let you know if there are medicines that need to be stopped and for how  long.   You should be present 60 minutes before your procedure or as directed by your caregiver.  PROCEDURE  Before surgery, an intravenous (IV) access for giving fluids will be started. You will be given medicines and/or gas to make you sleep (anesthetic). You may be given medicines in your back with a needle to make you numb from the waist down. Your surgeon will take out any damaged cartilage and bone. He or she will then put in new metal, plastic, or ceramic joint surfaces to restore alignment and function to your knee. AFTER THE PROCEDURE  You will be taken to the recovery area where a nurse will watch and check your progress. You may have a long, narrow tube (catheter) in your bladder after surgery. The catheter helps you empty your bladder (pass your urine). You may have drainage tubes coming out from under the dressing. These tubes attach to a device that removes blood or fluids that gather after surgery. Once you are awake, stable, and taking fluids well, you will be returned to your room. You will receive physical therapy as prescribed by your caregiver. If you do not have help at home, you may need to go to an extended care facility for a few weeks. If you are sent home with a continuous passive motion (CPM) machine, use it as instructed. Document Released: 06/06/2000 Document Revised: 02/17/2011 Document Reviewed: 12/31/2008 Exit[employer] Patient Information 2012 [employer]. 

## 2011-08-02 NOTE — Progress Notes (Signed)
ANTICOAGULATION CONSULT NOTE - Initial Consult  Pharmacy Consult for warfarin Indication: VTE prophylaxis  Allergies  Allergen Reactions  . Penicillins     REACTION: swelling , whelps    Patient Measurements:   Weight: 101kg  Vital Signs: Temp: 98 F (36.7 C) (05/21 1614) Temp src: Oral (05/21 1614) BP: 175/74 mmHg (05/21 1614) Pulse Rate: 70  (05/21 1614)  Labs: INR 0.95, Scr 0.6  Medical History: Past Medical History  Diagnosis Date  . Cholelithiasis 07/1996  . Hyperlipidemia 03/1999    takes Lipitor nightly  . NSVD (normal spontaneous vaginal delivery)     x 5  . Allergy 08/26/2002    [facility] hemoptysis actually allergic rhinitis  . Hypertension 06/1999    takes Atenolol and Diltiazem daily  . CAD (coronary artery disease)     1 stent  . Peripheral edema     takes Furosemide daily  . Pneumonia     hx of--as a child  . Osteoarthritis 07/1996  . Joint pain   . Joint swelling   . Back pain     pt states from knee pain  . GERD (gastroesophageal reflux disease)     takes Prevacid daily  . Urinary frequency   . History of kidney stones   . Depression     takes Zoloft daily  . Primary osteoarthritis of left knee 09/23/2010    Per Dr. Dion Saucier with [facility]   . Osteoarthritis of left knee 08/02/2011    Medications:  Prescriptions prior to admission  Medication Sig Dispense Refill  . aspirin 81 MG tablet Take 81 mg by mouth daily.        Marland Kitchen atenolol (TENORMIN) 25 MG tablet Take 25 mg by mouth daily.      Marland Kitchen atorvastatin (LIPITOR) 80 MG tablet Take 80 mg by mouth daily.      . cholecalciferol (VITAMIN D) 1000 UNITS tablet Take 1,000 Units by mouth 2 (two) times daily.        Marland Kitchen diltiazem (DILACOR XR) 240 MG 24 hr capsule Take 240 mg by mouth daily.      . furosemide (LASIX) 20 MG tablet Take 20 mg by mouth daily.      . lansoprazole (PREVACID) 15 MG capsule Take 15 mg by mouth daily.      . sertraline (ZOLOFT) 50 MG tablet Take 50 mg by mouth  daily.      . vitamin B-12 (CYANOCOBALAMIN) 1000 MCG tablet Take 1,000 mcg by mouth daily.      . vitamin C (ASCORBIC ACID) 500 MG tablet Take 500 mg by mouth daily.      Marland Kitchen DISCONTD: HYDROcodone-acetaminophen (VICODIN) 5-500 MG per tablet Take 1 tablet by mouth every 6 (six) hours as needed. For pain      . DISCONTD: naproxen sodium (ANAPROX) 220 MG tablet Take 220 mg by mouth 2 (two) times daily with a meal.        Assessment: 76 year old female s/p knee replacement to receive warfarin post op for VTE prophylaxis Goal of Therapy:  INR 2-3    Plan:  Warfarin 5mg  po x 1 today. Daily protimes. Will order warfarin book and video for patient.  Mickeal Skinner 08/02/2011,4:48 PM

## 2011-08-02 NOTE — Op Note (Signed)
DATE OF SURGERY:  08/02/2011 TIME: 1:33 PM  PATIENT NAME:  [name]   AGE: 76 y.o.    PRE-OPERATIVE DIAGNOSIS:  DJD LEFT KNEE  POST-OPERATIVE DIAGNOSIS:  Same  PROCEDURE:  Procedure(s): TOTAL KNEE ARTHROPLASTY   SURGEON:  Eulas Post, MD   ASSISTANT:  Janace Litten, OPA-C, present and scrubbed throughout the case, critical for assistance with exposure, retraction, instrumentation, and closure.   OPERATIVE IMPLANTS: Depuy PFC Sigma, Posterior Stabilized.  Femur size 4, Tibia size 4, Patella size 38 3-peg oval button, with a 10 mm polyethylene insert.   PREOPERATIVE INDICATIONS:  [name] is a 76 y.o. year old female with end stage bone on bone valgus degenerative arthritis of the knee who failed conservative treatment, including injections, antiinflammatories, activity modification, and assistive devices, and had significant impairment of their activities of daily living, and elected for Total Knee Arthroplasty.   The risks, benefits, and alternatives were discussed at length including but not limited to the risks of infection, bleeding, nerve injury, stiffness, blood clots, the need for revision surgery, cardiopulmonary complications, among others, and they were willing to proceed.   OPERATIVE DESCRIPTION:  The patient was brought to the operative room and placed in a supine position.  General anesthesia was administered.  IV antibiotics were given.  The lower extremity was prepped and draped in the usual sterile fashion.  Time out was performed.  The leg was elevated and exsanguinated and the tourniquet was inflated.  Anterior quadriceps tendon splitting approach was performed.  The patella was everted and osteophytes were removed.  The anterior horn of the medial and lateral meniscus was removed.   The distal femur was opened with the drill and the intramedullary distal femoral cutting jig was utilized, set at 5 degrees resecting 10 mm off the distal femur.  Care  was taken to protect the collateral ligaments. I performed a minimal resection on the lateral side, and basically just got cartilage off. I also preserved the medial collateral ligament almost in entirety, in order to minimize laxity medially. The distal femur was very hypoplastic, particularly on the distal segment laterally, not as much posteriorly.  Then the extramedullary tibial cutting jig was utilized making the appropriate cut using the anterior tibial crest as a reference building in appropriate posterior slope.  Care was taken during the cut to protect the medial and collateral ligaments.  The proximal tibia was removed along with the posterior horns of the menisci.  The PCL was sacrificed.    The extensor gap was measured and was approximately 10mm.    The distal femoral sizing jig was applied, taking care to avoid notching.  Then the 4-in-1 cutting jig was applied and the anterior and posterior femur was cut, along with the chamfer cuts.  All posterior osteophytes were removed.  The flexion gap was then measured and was symmetric with the extension gap.  I completed the distal femoral preparation using the appropriate jig to prepare the box.  The patella was then measured, and cut with the saw.    The proximal tibia sized and prepared accordingly with the reamer and the punch, and then all components were trialed with the 10mm poly insert.  The knee was found to have excellent balance and full motion.    The above named components were then cemented into place and all excess cement was removed.  The trial polyethylene component was in place during cementation, and then was exchanged for the real polyethylene component.  The knee was easily taken through a range of motion and the patella tracked well and the knee irrigated copiously and the parapatellar and subcutaneous tissue closed with vicryl, and monocryl with steri strips for the skin.  The wounds were injected with marcaine, and  dressed with sterile gauze and the tourniquet released and the patient was awakened and returned to the PACU in stable and satisfactory condition.  There were no complications.  Total tourniquet time was approximately 100 minutes.

## 2011-08-02 NOTE — Preoperative (Signed)
Beta Blockers   Reason not to administer Beta Blockers:Not Applicable 

## 2011-08-02 NOTE — Progress Notes (Signed)
Orthopedic Tech Progress Note Patient Details:  [name] 09/24/1935 [id number]  Other Ortho Devices Type of Ortho Device: Knee Immobilizer Ortho Device Location: left knee Ortho Device Interventions: Ordered Correctiuon; knee immobilizer is for right knee  [name] 08/02/2011, 4:59 PM
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
## 2011-08-02 NOTE — Progress Notes (Signed)
Orthopedic Tech Progress Note Patient Details:  [name] [birth year] [id number]  Patient ID: [name], female   DOB: January 20, 1936, 76 y.o.   MRN: [id number] Knee immobilizer has been applied to right knee  [name] 08/02/2011, 5:03 PM

## 2011-08-02 NOTE — Progress Notes (Signed)
Orthopedic Tech Progress Note Patient Details:  [name] 02-23-1936 [id number]  CPM Left Knee CPM Left Knee: On Left Knee Flexion (Degrees): 40  Left Knee Extension (Degrees): 0    Sharisse Rantz T 08/02/2011, 3:06 PM

## 2011-08-02 NOTE — Transfer of Care (Signed)
Immediate Anesthesia Transfer of Care Note  Patient: [name]  Procedure(s) Performed: Procedure(s) (LRB): TOTAL KNEE ARTHROPLASTY (Left)  Patient Location: PACU  Anesthesia Type: General and GA combined with regional for post-op pain  Level of Consciousness: awake and alert   Airway & Oxygen Therapy: Patient Spontanous Breathing and Patient connected to nasal cannula oxygen  Post-op Assessment: Report given to PACU RN, Post -op Vital signs reviewed and stable, Patient moving all extremities and Patient able to stick tongue midline  Post vital signs: Reviewed and stable  Complications: No apparent anesthesia complications

## 2011-08-02 NOTE — Anesthesia Postprocedure Evaluation (Signed)
Anesthesia Post Note  Patient: [name]  Procedure(s) Performed: Procedure(s) (LRB): TOTAL KNEE ARTHROPLASTY (Left)  Anesthesia type: general  Patient location: PACU  Post pain: Pain level controlled  Post assessment: Patient's Cardiovascular Status Stable  Last Vitals:  Filed Vitals:   08/02/11 1415  BP: 176/74  Pulse: 70  Temp: 36.6 C  Resp: 18    Post vital signs: Reviewed and stable  Level of consciousness: sedated  Complications: No apparent anesthesia complications

## 2011-08-02 NOTE — Anesthesia Procedure Notes (Signed)
Procedure Name: LMA Insertion Date/Time: 08/02/2011 11:35 AM Performed by: Neomia Dear, Alexi Dorminey K Pre-anesthesia Checklist: Patient identified, Timeout performed, Emergency Drugs available, Suction available and Patient being monitored Patient Re-evaluated:Patient Re-evaluated prior to inductionOxygen Delivery Method: Circle system utilized Preoxygenation: Pre-oxygenation with 100% oxygen Intubation Type: IV induction Ventilation: Mask ventilation without difficulty LMA: LMA inserted LMA Size: 4.0 Number of attempts: 1 Placement Confirmation: breath sounds checked- equal and bilateral and positive ETCO2 Tube secured with: Tape Dental Injury: Teeth and Oropharynx as per pre-operative assessment  Comments: LMA placed by Dr. Krista Blue, secured by same

## 2011-08-02 NOTE — Anesthesia Preprocedure Evaluation (Addendum)
Anesthesia Evaluation  Patient identified by MRN, date of birth, ID band Patient awake    Reviewed: Allergy & Precautions, H&P , NPO status , Patient's Chart, lab work & pertinent test results, reviewed documented beta blocker date and time   History of Anesthesia Complications Negative for: history of anesthetic complications  Airway Mallampati: I TM Distance: >3 FB Neck ROM: Full    Dental  (+) Dental Advisory Given and Teeth Intact   Pulmonary asthma , pneumonia ,  breath sounds clear to auscultation  Pulmonary exam normal       Cardiovascular hypertension, Pt. on medications and Pt. on home beta blockers + CAD Rhythm:Regular Rate:Normal     Neuro/Psych Depression negative neurological ROS     GI/Hepatic Neg liver ROS, GERD-  Medicated and Controlled,  Endo/Other  Morbid obesity  Renal/GU negative Renal ROS     Musculoskeletal   Abdominal   Peds  Hematology   Anesthesia Other Findings   Reproductive/Obstetrics                         Anesthesia Physical Anesthesia Plan  ASA: III  Anesthesia Plan: General   Post-op Pain Management:    Induction: Intravenous  Airway Management Planned: Oral ETT  Additional Equipment:   Intra-op Plan:   Post-operative Plan: Extubation in OR  Informed Consent: I have reviewed the patients History and Physical, chart, labs and discussed the procedure including the risks, benefits and alternatives for the proposed anesthesia with the patient or authorized representative who has indicated his/her understanding and acceptance.   Dental advisory given  Plan Discussed with: Anesthesiologist, Surgeon and CRNA  Anesthesia Plan Comments:       Anesthesia Quick Evaluation

## 2011-08-02 NOTE — Progress Notes (Signed)
Orthopedic Tech Progress Note Patient Details:  [name] Oct 04, 1935 [id number]  Other Ortho Devices Type of Ortho Device: Knee Immobilizer Ortho Device Location: left knee Ortho Device Interventions: Freeman Caldron, Terilyn Sano 08/02/2011, 4:59 PM

## 2011-08-03 ENCOUNTER — Encounter (HOSPITAL_COMMUNITY): Payer: Self-pay | Admitting: Orthopedic Surgery

## 2011-08-03 LAB — BASIC METABOLIC PANEL
BUN: 5 mg/dL — ABNORMAL LOW (ref 6–23)
Chloride: 98 mEq/L (ref 96–112)
Creatinine, Ser: 0.44 mg/dL — ABNORMAL LOW (ref 0.50–1.10)
GFR calc Af Amer: >90 mL/min (ref >90)
Glucose, Bld: 137 mg/dL — ABNORMAL HIGH (ref 70–99)
Potassium: 3.7 mEq/L (ref 3.5–5.1)

## 2011-08-03 LAB — CBC
HCT: 32.4 % — ABNORMAL LOW (ref 36.0–46.0)
Hemoglobin: 10.5 g/dL — ABNORMAL LOW (ref 12.0–15.0)
MCV: 86.6 fL (ref 78.0–100.0)
RDW: 13.4 % (ref 11.5–15.5)
WBC: 9.7 10*3/uL (ref 4.0–10.5)

## 2011-08-03 LAB — PROTIME-INR: INR: 1.12 (ref 0.00–1.49)

## 2011-08-03 MED ORDER — WARFARIN SODIUM 7.5 MG PO TABS
7.5000 mg | ORAL_TABLET | Freq: Once | ORAL | Status: AC
  Administered 2011-08-03: 7.5 mg via ORAL
  Filled 2011-08-03: qty 1

## 2011-08-03 NOTE — Progress Notes (Signed)
Physical Therapy Treatment Note   08/03/11 1325  PT Visit Information  Last PT Received On 08/03/11  Assistance Needed +1  PT Time Calculation  PT Start Time 1325  PT Stop Time 1400  PT Time Calculation (min) 35 min  Subjective Data  Subjective Pt received supine in bed with request to use restroom  Precautions  Precautions Knee  Required Braces or Orthoses Knee Immobilizer - Right  Knee Immobilizer - Right Discontinue post op day 2  Restrictions  LLE Weight Bearing WBAT  Cognition  Overall Cognitive Status Appears within functional limits for tasks assessed/performed  Arousal/Alertness Awake/alert  Orientation Level Appears intact for tasks assessed  Behavior During Session [facility] for tasks performed  Bed Mobility  Bed Mobility Supine to Sit  Supine to Sit 4: Min assist;HOB flat  Details for Bed Mobility Assistance max directional v/c's for safety and to use hands instead of hand rail to mimic home set up  Transfers  Transfers Sit to Stand;Stand to Sit  Sit to Stand 3: Mod assist;With upper extremity assist;From bed  Stand to Sit 4: Min assist;With upper extremity assist;With armrests;To chair/3-in-1  Details for Transfer Assistance v/c's for hand placement and sequencing, assist to achieve full upright position  Ambulation/Gait  Ambulation/Gait Assistance 4: Min assist  Ambulation Distance (Feet) 25 Feet  Assistive device Rolling walker  Ambulation/Gait Assistance Details initial v/c's for sequencing, pt extremely slow  Gait Pattern Step-to pattern;Decreased step length - left;Decreased stance time - left;Antalgic  Exercises  Exercises Total Joint  Total Joint Exercises  Quad Sets Left;10 reps;Supine  Short Arc Quad AROM;Left;10 reps;Seated (with LEs elevated)  Heel Slides AAROM;Left;15 reps;Seated (with LEs elevated)  PT - End of Session  Equipment Utilized During Treatment Gait belt;Left knee immobilizer  Activity Tolerance Patient limited by fatigue;Patient limited  by pain  Patient left in chair;with call bell/phone within reach  Nurse Communication (RN notified patient did not receive lunch tray)  PT - Assessment/Plan  Comments on Treatment Session Patient with slow progression for this AM. Patient assist on/off commode, max v/c's for walker management and L LE management. Patient supervision with hygiene s/p urination. Pt con't to require 24/7 supervision/assist upon d/c.  PT Plan Discharge plan remains appropriate;Frequency remains appropriate  PT Frequency 7X/week  Follow Up Recommendations Home health PT;Supervision/Assistance - 24 hour  Equipment Recommended None recommended by PT  Acute Rehab PT Goals  PT Goal Formulation With patient  Time For Goal Achievement 08/10/11  Potential to Achieve Goals Good  PT Goal: Supine/Side to Sit - Progress Progressing toward goal  PT Goal: Sit to Stand - Progress Progressing toward goal  PT Goal: Ambulate - Progress Progressing toward goal  PT Goal: Perform Home Exercise Program - Progress Progressing toward goal    Pain: pt did not rate L LE pain but reports increased pain with weight-bearing  Lewis Shock, PT, DPT Pager #: [phone] Office #: [phone]

## 2011-08-03 NOTE — Progress Notes (Signed)
ANTICOAGULATION CONSULT NOTE - Follow up Consult  Pharmacy Consult for Coumadin Indication: VTE prophylaxis  Allergies  Allergen Reactions  . Penicillins     REACTION: swelling , whelps    Patient Measurements: Height: 5\' 6"  (167.6 cm) (from preadmit 07/27/11) Weight: 222 lb 4.8 oz (100.835 kg) (from 07/27/11 preadmit) IBW/kg (Calculated) : 59.3    Vital Signs: Temp: 99.9 F (37.7 C) (05/22 0557) BP: 176/83 mmHg (05/22 0557) Pulse Rate: 82  (05/22 0557)  Labs:  Basename 08/03/11 0540  HGB 10.5*  HCT 32.4*  PLT 204  APTT --  LABPROT 14.6  INR 1.12  HEPARINUNFRC --  CREATININE 0.44*  CKTOTAL --  CKMB --  TROPONINI --    Estimated Creatinine Clearance: 75 ml/min (by C-G formula based on Cr of 0.44).   Medical History: Past Medical History  Diagnosis Date  . Cholelithiasis 07/1996  . Hyperlipidemia 03/1999    takes Lipitor nightly  . NSVD (normal spontaneous vaginal delivery)     x 5  . Allergy 08/26/2002    [facility] hemoptysis actually allergic rhinitis  . Hypertension 06/1999    takes Atenolol and Diltiazem daily  . CAD (coronary artery disease)     1 stent  . Peripheral edema     takes Furosemide daily  . Pneumonia     hx of--as a child  . Osteoarthritis 07/1996  . Joint pain   . Joint swelling   . Back pain     pt states from knee pain  . GERD (gastroesophageal reflux disease)     takes Prevacid daily  . Urinary frequency   . History of kidney stones   . Depression     takes Zoloft daily  . Primary osteoarthritis of left knee 09/23/2010    Per Dr. Dion Saucier with [facility]   . Osteoarthritis of left knee 08/02/2011    Medications:  Prescriptions prior to admission  Medication Sig Dispense Refill  . aspirin 81 MG tablet Take 81 mg by mouth daily.        Marland Kitchen atenolol (TENORMIN) 25 MG tablet Take 25 mg by mouth daily.      Marland Kitchen atorvastatin (LIPITOR) 80 MG tablet Take 80 mg by mouth daily.      . cholecalciferol (VITAMIN D) 1000 UNITS  tablet Take 1,000 Units by mouth 2 (two) times daily.        Marland Kitchen diltiazem (DILACOR XR) 240 MG 24 hr capsule Take 240 mg by mouth daily.      . furosemide (LASIX) 20 MG tablet Take 20 mg by mouth daily.      . lansoprazole (PREVACID) 15 MG capsule Take 15 mg by mouth daily.      . sertraline (ZOLOFT) 50 MG tablet Take 50 mg by mouth daily.      . vitamin B-12 (CYANOCOBALAMIN) 1000 MCG tablet Take 1,000 mcg by mouth daily.      . vitamin C (ASCORBIC ACID) 500 MG tablet Take 500 mg by mouth daily.      Marland Kitchen DISCONTD: HYDROcodone-acetaminophen (VICODIN) 5-500 MG per tablet Take 1 tablet by mouth every 6 (six) hours as needed. For pain      . DISCONTD: naproxen sodium (ANAPROX) 220 MG tablet Take 220 mg by mouth 2 (two) times daily with a meal.       Scheduled:    . atenolol  25 mg Oral Daily  . atorvastatin  80 mg Oral q1800  . cholecalciferol  1,000 Units Oral BID  . diltiazem  240 mg Oral Daily  . docusate sodium  100 mg Oral BID  . enoxaparin  30 mg Subcutaneous Q12H  . furosemide  20 mg Oral Daily  . methocarbamol (ROBAXIN) IV  500 mg Intravenous To PACU  . oxyCODONE      . pantoprazole  20 mg Oral Q1200  . patient's guide to using coumadin book   Does not apply Once  . senna  1 tablet Oral BID  . sertraline  50 mg Oral Daily  . vancomycin  1,000 mg Intravenous Q12H  . vitamin B-12  1,000 mcg Oral Daily  . vitamin C  500 mg Oral Daily  . warfarin  5 mg Oral ONCE-1800  . warfarin   Does not apply Once  . Warfarin - Pharmacist Dosing Inpatient   Does not apply q1800    Assessment: 76 y.o. Female POD#1 s/p L TKA started Coumadin 5/21 for VTE prophylaxis. Also on lovenox 30mg  SQ q12h for VTE prophylaxis until INR = or >1.8 per MD's order. Today her INR =1.12.  Preadmit INR = 0.95 H/H 10.5/32.4,  pltc 204K  No bleeding reported.  Warfarin predictor point score is 5 pts.  Goal of Therapy:  INR 2-3    Plan:  Coumadin 7.5mg  po today x1 INR qAM Lovenox 30mg  SQ q12h continues until  INR = or >1.8 per MD's order.   Noah Delaine, RPh Clinical Pharmacist 08/03/2011,9:53 AM
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
## 2011-08-03 NOTE — Progress Notes (Signed)
     Subjective:  Patient reports pain as moderate.  Otherwise doing well.   Objective:   VITALS:   Filed Vitals:   08/02/11 2139 08/03/11 0224 08/03/11 0557 08/03/11 0900  BP: 151/78 159/68 176/83   Pulse: 65 77 82   Temp: 98.8 F (37.1 C) 99.3 F (37.4 C) 99.9 F (37.7 C)   TempSrc:      Resp: 17 19 18    Height:    5\' 6"  (1.676 m)  Weight:    100.835 kg (222 lb 4.8 oz)  SpO2: 96% 93% 96%     Neurologically intact Sensation intact distally Incision: dressing C/D/I  LABS  Results for orders placed during the hospital encounter of 08/02/11 (from the past 24 hour(s))  PROTIME-INR     Status: Normal   Collection Time   08/03/11  5:40 AM      Component Value Range   Prothrombin Time 14.6  11.6 - 15.2 (seconds)   INR 1.12  0.00 - 1.49   CBC     Status: Abnormal   Collection Time   08/03/11  5:40 AM      Component Value Range   WBC 9.7  4.0 - 10.5 (K/uL)   RBC 3.74 (*) 3.87 - 5.11 (MIL/uL)   Hemoglobin 10.5 (*) 12.0 - 15.0 (g/dL)   HCT 16.1 (*) 09.6 - 46.0 (%)   MCV 86.6  78.0 - 100.0 (fL)   MCH 28.1  26.0 - 34.0 (pg)   MCHC 32.4  30.0 - 36.0 (g/dL)   RDW 04.5  40.9 - 81.1 (%)   Platelets 204  150 - 400 (K/uL)  BASIC METABOLIC PANEL     Status: Abnormal   Collection Time   08/03/11  5:40 AM      Component Value Range   Sodium 133 (*) 135 - 145 (mEq/L)   Potassium 3.7  3.5 - 5.1 (mEq/L)   Chloride 98  96 - 112 (mEq/L)   CO2 27  19 - 32 (mEq/L)   Glucose, Bld 137 (*) 70 - 99 (mg/dL)   BUN 5 (*) 6 - 23 (mg/dL)   Creatinine, Ser 9.14 (*) 0.50 - 1.10 (mg/dL)   Calcium 8.4  8.4 - 78.2 (mg/dL)   GFR calc non Af Amer >90  >90 (mL/min)   GFR calc Af Amer >90  >90 (mL/min)    X-ray Knee Left Port  08/02/2011  *RADIOLOGY REPORT*  Clinical Data: History of post left knee arthroplasty.  PORTABLE LEFT KNEE - 1-2 VIEW  Comparison: 09/23/2010.  Findings: Total knee arthroplasty procedure has been performed.  No dislocation is seen.  There is expected relationship between  hardware components with no disruption of hardware evident.  Air and soft tissue swelling are present at the operative sites.  IMPRESSION: Post total knee arthroplasty procedure.  Original Report Authenticated By: Crawford Givens, M.D.    Assessment/Plan: 1 Day Post-Op   Principal Problem:  *Osteoarthritis of left knee Active Problems:  Primary osteoarthritis of left knee   Advance diet Up with therapy Doing ok.  dispo planing.   Iysis Germain P 08/03/2011, 2:57 PM   Teryl Lucy, MD [phone] pager

## 2011-08-03 NOTE — Progress Notes (Signed)
UR COMPLETED  

## 2011-08-03 NOTE — Progress Notes (Signed)
Occupational Therapy Evaluation Patient Details Name: [name] MRN: [id number] DOB: 1935-10-31 Today's Date: 08/03/2011 Time: 1100-     OT Assessment / Plan / Recommendation Clinical Impression  76 yo s/p L TKA. Pt will benefit from skilled Ot services secondary to deficits listed below to max indep with ADL and functional mobility for ADL to facilitate D/C home with 24/7 supervision of family.     OT Assessment  Patient needs continued OT Services    Follow Up Recommendations  Home health OT    Barriers to Discharge None    Equipment Recommendations  None recommended by OT    Recommendations for Other Services    Frequency  Min 2X/week    Precautions / Restrictions Precautions Precautions: Knee Required Braces or Orthoses: Knee Immobilizer - Right;Knee Immobilizer - Left (d/c KI POD2 for L.) Knee Immobilizer - Left: Discontinue post op day 2 Restrictions LLE Weight Bearing: Weight bearing as tolerated   Pertinent Vitals/Pain 5. nsg notified    ADL  Eating/Feeding: Simulated;Independent Where Assessed - Eating/Feeding: Chair Grooming: Performed;Set up Where Assessed - Grooming: Supported sitting Upper Body Bathing: Simulated;Set up Where Assessed - Upper Body Bathing: Supported sitting Lower Body Bathing: Simulated;Moderate assistance Where Assessed - Lower Body Bathing: Supported sit to stand Upper Body Dressing: Simulated;Set up Where Assessed - Upper Body Dressing: Supported sitting Lower Body Dressing: Simulated;Maximal assistance Where Assessed - Lower Body Dressing: Sopported sit to stand Toilet Transfer: Performed;+2 Total assistance (due to KI on R per order) Toilet Transfer: Patient Percentage: 70% Toilet Transfer Method: Stand pivot;Sit to Barista: Bedside commode Toileting - Clothing Manipulation and Hygiene: Performed;Moderate assistance Where Assessed - Toileting Clothing Manipulation and Hygiene: Standing Equipment Used:  Gait belt;Knee Immobilizer;Rolling walker Transfers/Ambulation Related to ADLs: +2 due to KI on R. Will address with MD. This makes pt less mobile ADL Comments: Pt motivated to become more independent. Pt instructed to wear R wrist brace to give supoort when ambulating with walker.    OT Diagnosis: Generalized weakness;Acute pain  OT Problem List: Decreased strength;Decreased range of motion;Decreased activity tolerance;Impaired balance (sitting and/or standing);Decreased safety awareness;Decreased knowledge of use of DME or AE;Decreased knowledge of precautions;Pain OT Treatment Interventions:     OT Goals Acute Rehab OT Goals OT Goal Formulation: With patient Time For Goal Achievement: 08/10/11 Potential to Achieve Goals: Good ADL Goals Pt Will Perform Lower Body Bathing: with min assist;with caregiver independent in assisting;Sit to stand from chair ADL Goal: Lower Body Bathing - Progress: Goal set today Pt Will Perform Lower Body Dressing: with min assist;with caregiver independent in assisting;Sit to stand from chair;Unsupported;with adaptive equipment ADL Goal: Lower Body Dressing - Progress: Goal set today Pt Will Transfer to Toilet: with supervision;with DME;Ambulation;3-in-1;with caregiver independent in assisting ADL Goal: Toilet Transfer - Progress: Goal set today Pt Will Perform Toileting - Clothing Manipulation: with modified independence;Standing ADL Goal: Toileting - Clothing Manipulation - Progress: Goal set today Pt Will Perform Toileting - Hygiene: with modified independence;Standing at 3-in-1/toilet ADL Goal: Toileting - Hygiene - Progress: Goal set today Pt Will Perform Tub/Shower Transfer: with min assist;with caregiver independent in assisting;Shower transfer;with DME;Ambulation (3 in 1) ADL Goal: Tub/Shower Transfer - Progress: Goal set today  Visit Information  Last OT Received On: 08/03/11 Assistance Needed: +2    Subjective Data      Prior Functioning   Home Living Lives With: Spouse Available Help at Discharge: Family;Available 24 hours/day (daughter and spouse) Type of Home: House Home Access: Ramped entrance Home  Layout: One level Bathroom Shower/Tub: Walk-in shower;Door [employer] Toilet: Handicapped height Bathroom Accessibility: Yes How Accessible: Accessible via walker Home Adaptive Equipment: Bedside commode/3-in-1;Walker - rolling Prior Function Level of Independence: Independent Able to Take Stairs?: No Driving: No Vocation: Retired Musician: No difficulties Dominant Hand: Right    Cognition  Overall Cognitive Status: Appears within functional limits for tasks assessed/performed Arousal/Alertness: Awake/alert Orientation Level: Appears intact for tasks assessed Behavior During Session: [facility] for tasks performed    Extremity/Trunk Assessment Right Upper Extremity Assessment RUE ROM/Strength/Tone: [facility] for tasks assessed Left Upper Extremity Assessment LUE ROM/Strength/Tone: WFL for tasks assessed Right Lower Extremity Assessment RLE ROM/Strength/Tone: Within functional levels Left Lower Extremity Assessment LLE ROM/Strength/Tone: Deficits;Due to pain LLE ROM/Strength/Tone Deficits: initiate quad set, approx 30 deg knee flex Trunk Assessment Trunk Assessment: Normal   Mobility Bed Mobility Bed Mobility: Supine to Sit Supine to Sit: 3: Mod assist;With rails;HOB elevated (HOB 30 deg) Details for Bed Mobility Assistance: v/c's for hand placement to minimize use of hand rails to mimic home set up, assist for trunk elevation and L LE Transfers Sit to Stand: 2: Max assist;With upper extremity assist;From bed Stand to Sit: 4: Min assist;With upper extremity assist;With armrests;To chair/3-in-1 Details for Transfer Assistance: increased assist for sit --> stand and max v/c's to push up with R LE    Exercise Total Joint Exercises Ankle Circles/Pumps: AROM;Both;10 reps;Supine Quad Sets: Left;10  reps;Supine Goniometric ROM: L knee flex AA 25 deg  Balance    End of Session OT - End of Session Equipment Utilized During Treatment: Gait belt;Right knee immobilizer Activity Tolerance: Patient tolerated treatment well Patient left: in chair;with call bell/phone within reach Nurse Communication: Mobility status;Other (comment);Weight bearing status (KI. will get clarification from MD)   [facility] 08/03/2011, 11:37 AM Luisa Dago, OTR/L  [phone] 08/03/2011

## 2011-08-04 LAB — CBC
HCT: 30.2 % — ABNORMAL LOW (ref 36.0–46.0)
Hemoglobin: 9.8 g/dL — ABNORMAL LOW (ref 12.0–15.0)
MCH: 28.1 pg (ref 26.0–34.0)
MCV: 86.5 fL (ref 78.0–100.0)
RBC: 3.49 MIL/uL — ABNORMAL LOW (ref 3.87–5.11)
WBC: 13.3 10*3/uL — ABNORMAL HIGH (ref 4.0–10.5)

## 2011-08-04 LAB — PROTIME-INR
INR: 1.42 (ref 0.00–1.49)
Prothrombin Time: 17.6 seconds — ABNORMAL HIGH (ref 11.6–15.2)

## 2011-08-04 MED ORDER — WARFARIN SODIUM 7.5 MG PO TABS
7.5000 mg | ORAL_TABLET | Freq: Once | ORAL | Status: AC
  Administered 2011-08-04: 7.5 mg via ORAL
  Filled 2011-08-04: qty 1

## 2011-08-04 NOTE — Progress Notes (Signed)
Patient ID: [name], female   DOB: 11-14-1935, 76 y.o.   MRN: [id number]     Subjective:  Patient reports pain as mild to moderate.  She states that her pain is better today and that she would like to go home later in the week.  She denies any CP or SOB.  Objective:   VITALS:   Filed Vitals:   08/03/11 0900 08/03/11 1400 08/03/11 2227 08/04/11 0523  BP:  155/74 148/52 144/67  Pulse:  65 74 84  Temp:  98.4 F (36.9 C) 98.7 F (37.1 C) 98.2 F (36.8 C)  TempSrc:    Oral  Resp:  16 18 16   Height: 5\' 6"  (1.676 m)     Weight: 100.835 kg (222 lb 4.8 oz)     SpO2:  95% 98% 97%    ABD soft Sensation intact distally Dorsiflexion/Plantar flexion intact Incision: dressing C/D/I and no drainage  LABS  Results for orders placed during the hospital encounter of 08/02/11 (from the past 24 hour(s))  PROTIME-INR     Status: Abnormal   Collection Time   08/04/11  5:26 AM      Component Value Range   Prothrombin Time 17.6 (*) 11.6 - 15.2 (seconds)   INR 1.42  0.00 - 1.49   CBC     Status: Abnormal   Collection Time   08/04/11  5:26 AM      Component Value Range   WBC 13.3 (*) 4.0 - 10.5 (K/uL)   RBC 3.49 (*) 3.87 - 5.11 (MIL/uL)   Hemoglobin 9.8 (*) 12.0 - 15.0 (g/dL)   HCT 40.9 (*) 81.1 - 46.0 (%)   MCV 86.5  78.0 - 100.0 (fL)   MCH 28.1  26.0 - 34.0 (pg)   MCHC 32.5  30.0 - 36.0 (g/dL)   RDW 91.4  78.2 - 95.6 (%)   Platelets 213  150 - 400 (K/uL)    X-ray Knee Left Port  08/02/2011  *RADIOLOGY REPORT*  Clinical Data: History of post left knee arthroplasty.  PORTABLE LEFT KNEE - 1-2 VIEW  Comparison: 09/23/2010.  Findings: Total knee arthroplasty procedure has been performed.  No dislocation is seen.  There is expected relationship between hardware components with no disruption of hardware evident.  Air and soft tissue swelling are present at the operative sites.  IMPRESSION: Post total knee arthroplasty procedure.  Original Report Authenticated By: Crawford Givens, M.D.     Assessment/Plan: 2 Days Post-Op   Principal Problem:  *Osteoarthritis of left knee Active Problems:  Primary osteoarthritis of left knee   Advance diet Up with therapy Plan for discharge tomorrow or Sat. ? SNF  Haskel Khan 08/04/2011, 10:04 AM   Teryl Lucy, MD [phone] pager

## 2011-08-04 NOTE — Progress Notes (Signed)
Physical Therapy Treatment Note   08/04/11 1052  PT Visit Information  Last PT Received On 08/04/11  Assistance Needed +1  PT Time Calculation  PT Start Time 1128  PT Stop Time 1149  PT Time Calculation (min) 21 min  Subjective Data  Subjective pt received sitting up in chair with report "I'm going to rehab." Patient reports MD to have told her she should go to rehab and patient is agreeable.  Precautions  Precautions Knee  Knee Immobilizer - Right Discontinue post op day 2  Restrictions  LLE Weight Bearing WBAT  Cognition  Overall Cognitive Status Appears within functional limits for tasks assessed/performed  Arousal/Alertness Awake/alert  Orientation Level Appears intact for tasks assessed  Behavior During Session [facility] for tasks performed  Transfers  Transfers Sit to Stand;Stand to Sit;Stand Pivot Transfers  Sit to Stand 2: Max assist  Stand to Sit 4: Min assist;With upper extremity assist;With armrests;To chair/3-in-1  Stand Pivot Transfers 4: Min assist;With armrests  Details for Transfer Assistance pt with request to use BSC, no KI on L LE, max directional v/c's for hand placement and L LE management. increased assist required to achieve full upright posture  Ambulation/Gait  Ambulation/Gait Assistance 4: Min assist  Ambulation Distance (Feet) 25 Feet  Assistive device Rolling walker  Ambulation/Gait Assistance Details pt did not use KI on L LE, pt with no episodes of L knee buckling. max directional verbal cues for walker sequencing and to complete quad sets when in stance phase  Gait Pattern Step-to pattern;Decreased step length - left;Decreased stance time - left;Antalgic  Gait velocity extremely slow  Stairs No  PT - End of Session  Equipment Utilized During Treatment Gait belt  Activity Tolerance Patient limited by fatigue  Patient left in chair;with call bell/phone within reach  Nurse Communication Mobility status  PT - Assessment/Plan  Comments on Treatment  Session Patient tolerated ambulation well without KI however con't to require increased assist for all mobility and ADLs. Patient with report of now going to SNF per MD recommendations. Patient to benefit from SNF placement to maximize functional recovery for safe transition home. Patient reports family to not be able to provide assist that she needs.  PT Plan Discharge plan needs to be updated;Frequency needs to be updated  PT Frequency 7X/week  Follow Up Recommendations Skilled nursing facility  Equipment Recommended None recommended by PT  Acute Rehab PT Goals  Time For Goal Achievement 08/10/11  PT Goal: Sit to Stand - Progress Progressing toward goal  PT Goal: Ambulate - Progress Progressing toward goal  PT Goal: Perform Home Exercise Program - Progress Progressing toward goal    Pain: pt did not rate pain, pt with grimace with increased L LE WBing  Lewis Shock, PT, DPT Pager #: [phone] Office #: [phone]

## 2011-08-04 NOTE — Progress Notes (Signed)
ANTICOAGULATION CONSULT NOTE - Follow up Consult  Pharmacy Consult for Coumadin Indication: VTE prophylaxis  Allergies  Allergen Reactions  . Penicillins     REACTION: swelling , whelps    Patient Measurements: Height: 5\' 6"  (167.6 cm) (from preadmit 07/27/11) Weight: 222 lb 4.8 oz (100.835 kg) (from 07/27/11 preadmit) IBW/kg (Calculated) : 59.3    Vital Signs: Temp: 98.2 F (36.8 C) (05/23 0523) Temp src: Oral (05/23 0523) BP: 144/67 mmHg (05/23 0523) Pulse Rate: 84  (05/23 0523)  Labs:  Basename 08/04/11 0526 08/03/11 0540  HGB 9.8* 10.5*  HCT 30.2* 32.4*  PLT 213 204  APTT -- --  LABPROT 17.6* 14.6  INR 1.42 1.12  HEPARINUNFRC -- --  CREATININE -- 0.44*  CKTOTAL -- --  CKMB -- --  TROPONINI -- --    Estimated Creatinine Clearance: 75 ml/min (by C-G formula based on Cr of 0.44).   Medical History: Past Medical History  Diagnosis Date  . Cholelithiasis 07/1996  . Hyperlipidemia 03/1999    takes Lipitor nightly  . NSVD (normal spontaneous vaginal delivery)     x 5  . Allergy 08/26/2002    [facility] hemoptysis actually allergic rhinitis  . Hypertension 06/1999    takes Atenolol and Diltiazem daily  . CAD (coronary artery disease)     1 stent  . Peripheral edema     takes Furosemide daily  . Pneumonia     hx of--as a child  . Osteoarthritis 07/1996  . Joint pain   . Joint swelling   . Back pain     pt states from knee pain  . GERD (gastroesophageal reflux disease)     takes Prevacid daily  . Urinary frequency   . History of kidney stones   . Depression     takes Zoloft daily  . Primary osteoarthritis of left knee 09/23/2010    Per Dr. Dion Saucier with [facility]   . Osteoarthritis of left knee 08/02/2011    Medications:  Prescriptions prior to admission  Medication Sig Dispense Refill  . aspirin 81 MG tablet Take 81 mg by mouth daily.        Marland Kitchen atenolol (TENORMIN) 25 MG tablet Take 25 mg by mouth daily.      Marland Kitchen atorvastatin (LIPITOR) 80  MG tablet Take 80 mg by mouth daily.      . cholecalciferol (VITAMIN D) 1000 UNITS tablet Take 1,000 Units by mouth 2 (two) times daily.        Marland Kitchen diltiazem (DILACOR XR) 240 MG 24 hr capsule Take 240 mg by mouth daily.      . furosemide (LASIX) 20 MG tablet Take 20 mg by mouth daily.      . lansoprazole (PREVACID) 15 MG capsule Take 15 mg by mouth daily.      . sertraline (ZOLOFT) 50 MG tablet Take 50 mg by mouth daily.      . vitamin B-12 (CYANOCOBALAMIN) 1000 MCG tablet Take 1,000 mcg by mouth daily.      . vitamin C (ASCORBIC ACID) 500 MG tablet Take 500 mg by mouth daily.      Marland Kitchen DISCONTD: HYDROcodone-acetaminophen (VICODIN) 5-500 MG per tablet Take 1 tablet by mouth every 6 (six) hours as needed. For pain      . DISCONTD: naproxen sodium (ANAPROX) 220 MG tablet Take 220 mg by mouth 2 (two) times daily with a meal.       Scheduled:     . atenolol  25 mg Oral Daily  .  atorvastatin  80 mg Oral q1800  . cholecalciferol  1,000 Units Oral BID  . diltiazem  240 mg Oral Daily  . docusate sodium  100 mg Oral BID  . enoxaparin  30 mg Subcutaneous Q12H  . furosemide  20 mg Oral Daily  . pantoprazole  20 mg Oral Q1200  . senna  1 tablet Oral BID  . sertraline  50 mg Oral Daily  . vitamin B-12  1,000 mcg Oral Daily  . vitamin C  500 mg Oral Daily  . warfarin  7.5 mg Oral ONCE-1800  . Warfarin - Pharmacist Dosing Inpatient   Does not apply q1800    Assessment: 76 y.o. Female POD#2 s/p L TKA started Coumadin 5/21 for VTE prophylaxis. Also on lovenox 30mg  SQ q12h for VTE prophylaxis until INR = or >1.8 per MD's order. Today her INR =1.42.  H/H 9.8/30.2,  pltc 213K  No bleeding reported.  Warfarin predictor point score is 5 pts.  Goal of Therapy:  INR 2-3   Plan:  Coumadin 7.5mg  po today x1 INR qAM Lovenox 30mg  SQ q12h continues until INR = or >1.8 per MD's order.   Noah Delaine, RPh Clinical Pharmacist 08/04/2011,10:36 AM

## 2011-08-04 NOTE — Clinical Social Work Psychosocial (Signed)
     Clinical Social Work Department BRIEF PSYCHOSOCIAL ASSESSMENT 08/04/2011  Patient:  [name], [name]     Account Number:  [id number]     Admit date:  08/02/2011  Clinical Social Worker:  Doree Albee  Date/Time:  08/04/2011 04:45 PM  Referred by:  Physician  Date Referred:  08/04/2011 Referred for  SNF Placement   Other Referral:   Interview type:  Patient Other interview type:   and patient daughter    PSYCHOSOCIAL DATA Living Status:  FAMILY Admitted from facility:   Level of care:   Primary support name:  Greggory Stallion Horsman Primary support relationship to patient:  SPOUSE Degree of support available:   moderate    CURRENT CONCERNS Current Concerns  Post-Acute Placement   Other Concerns:    SOCIAL WORK ASSESSMENT / PLAN CSW met with pt and pt daughter at bedside to discuss pt dc plans when medically stable. Pt and pt daughter agreed that pt planned to dc home with home health services. Pt recommended home health with 24 hour supervision. PT spouse and pt daughter able willing able to provide the support for pt when pt is medically stable to discharge home.    Pt and pt daughter are eager to speak with rn case manager regarding home health services.  CSW provided patient family with contact information and a snf list as a resources just in case.  csw informed rn case Production designer, theatre/television/film of pt plans to dc home iwth home health services.   Assessment/plan status:  No Further Intervention Required Other assessment/ plan:   Information/referral to community resources:   snf list as resource    PATIENTS/FAMILYS RESPONSE TO PLAN OF CARE: pt and pt daughter appreciated csw concern and support. Pt plans to dc home with home health no further csw needs, signing off.

## 2011-08-05 ENCOUNTER — Encounter: Payer: Self-pay | Admitting: Internal Medicine

## 2011-08-05 LAB — CBC
HCT: 29.1 % — ABNORMAL LOW (ref 36.0–46.0)
Hemoglobin: 9.7 g/dL — ABNORMAL LOW (ref 12.0–15.0)
MCH: 28.9 pg (ref 26.0–34.0)
MCV: 86.6 fL (ref 78.0–100.0)
Platelets: 217 10*3/uL (ref 150–400)
RBC: 3.36 MIL/uL — ABNORMAL LOW (ref 3.87–5.11)
WBC: 12.5 10*3/uL — ABNORMAL HIGH (ref 4.0–10.5)

## 2011-08-05 LAB — PROTIME-INR
INR: 1.83 — ABNORMAL HIGH (ref 0.00–1.49)
Prothrombin Time: 21.5 seconds — ABNORMAL HIGH (ref 11.6–15.2)

## 2011-08-05 MED ORDER — WARFARIN SODIUM 5 MG PO TABS
5.0000 mg | ORAL_TABLET | Freq: Once | ORAL | Status: AC
  Administered 2011-08-05: 5 mg via ORAL
  Filled 2011-08-05: qty 1

## 2011-08-05 NOTE — Discharge Summary (Signed)
Physician Discharge Summary  Patient ID: [name] MRN: [id number] DOB/AGE: Aug 16, 1935 76 y.o.  Admit date: 08/02/2011 Discharge date: 08/05/2011  Admission Diagnoses:  Osteoarthritis of left knee  Discharge Diagnoses:  Principal Problem:  *Osteoarthritis of left knee Active Problems:  Primary osteoarthritis of left knee  mild acute blood loss anemia, observed.   Past Medical History  Diagnosis Date  . Cholelithiasis 07/1996  . Hyperlipidemia 03/1999    takes Lipitor nightly  . NSVD (normal spontaneous vaginal delivery)     x 5  . Allergy 08/26/2002    [facility] hemoptysis actually allergic rhinitis  . Hypertension 06/1999    takes Atenolol and Diltiazem daily  . CAD (coronary artery disease)     1 stent  . Peripheral edema     takes Furosemide daily  . Pneumonia     hx of--as a child  . Osteoarthritis 07/1996  . Joint pain   . Joint swelling   . Back pain     pt states from knee pain  . GERD (gastroesophageal reflux disease)     takes Prevacid daily  . Urinary frequency   . History of kidney stones   . Depression     takes Zoloft daily  . Primary osteoarthritis of left knee 09/23/2010    Per Dr. Dion Saucier with [facility]   . Osteoarthritis of left knee 08/02/2011    Surgeries: Procedure(s): TOTAL KNEE ARTHROPLASTY on 08/02/2011   Consultants (if any):    Discharged Condition: Improved  Hospital Course: [name] is an 76 y.o. female who was admitted 08/02/2011 with a diagnosis of Osteoarthritis of left knee and went to the operating room on 08/02/2011 and underwent the above named procedures.    She was given perioperative antibiotics:  Anti-infectives     Start     Dose/Rate Route Frequency Ordered Stop   08/02/11 2200   vancomycin (VANCOCIN) IVPB 1000 mg/200 mL premix        1,000 mg 200 mL/hr over 60 Minutes Intravenous Every 12 hours 08/02/11 1641 08/02/11 2221        .  She was given sequential compression devices, early  ambulation, and chemoprophylaxis for DVT prophylaxis.  She benefited maximally from the hospital stay and there were no complications.    Recent vital signs:  Filed Vitals:   08/05/11 0523  BP: 132/63  Pulse: 77  Temp: 98.8 F (37.1 C)  Resp: 18    Recent laboratory studies:  Lab Results  Component Value Date   HGB 9.7* 08/05/2011   HGB 9.8* 08/04/2011   HGB 10.5* 08/03/2011   Lab Results  Component Value Date   WBC 12.5* 08/05/2011   PLT 217 08/05/2011   Lab Results  Component Value Date   INR 1.83* 08/05/2011   Lab Results  Component Value Date   NA 133* 08/03/2011   K 3.7 08/03/2011   CL 98 08/03/2011   CO2 27 08/03/2011   BUN 5* 08/03/2011   CREATININE 0.44* 08/03/2011   GLUCOSE 137* 08/03/2011    Discharge Medications:   Medication List  As of 08/05/2011 10:13 AM   STOP taking these medications         HYDROcodone-acetaminophen 5-500 MG per tablet      naproxen sodium 220 MG tablet         TAKE these medications         aspirin 81 MG tablet   Take 81 mg by mouth daily.  atenolol 25 MG tablet   Commonly known as: TENORMIN   Take 25 mg by mouth daily.      atorvastatin 80 MG tablet   Commonly known as: LIPITOR   Take 80 mg by mouth daily.      cholecalciferol 1000 UNITS tablet   Commonly known as: VITAMIN D   Take 1,000 Units by mouth 2 (two) times daily.      diltiazem 240 MG 24 hr capsule   Commonly known as: DILACOR XR   Take 240 mg by mouth daily.      furosemide 20 MG tablet   Commonly known as: LASIX   Take 20 mg by mouth daily.      lansoprazole 15 MG capsule   Commonly known as: PREVACID   Take 15 mg by mouth daily.      oxyCODONE-acetaminophen 10-325 MG per tablet   Commonly known as: PERCOCET   Take 1-2 tablets by mouth every 6 (six) hours as needed for pain. MAXIMUM TOTAL ACETAMINOPHEN DOSE IS 4000 MG PER DAY      promethazine 25 MG tablet   Commonly known as: PHENERGAN   Take 1 tablet (25 mg total) by mouth every 6 (six) hours  as needed for nausea.      sertraline 50 MG tablet   Commonly known as: ZOLOFT   Take 50 mg by mouth daily.      vitamin B-12 1000 MCG tablet   Commonly known as: CYANOCOBALAMIN   Take 1,000 mcg by mouth daily.      vitamin C 500 MG tablet   Commonly known as: ASCORBIC ACID   Take 500 mg by mouth daily.      warfarin 5 MG tablet   Commonly known as: COUMADIN   Take 1 tablet (5 mg total) by mouth daily.            Diagnostic Studies: Dg Chest 2 View  07/27/2011  *RADIOLOGY REPORT*  Clinical Data: Preop for left knee arthroplasty  CHEST - 2 VIEW  Comparison: None.  Findings: Cardiomediastinal silhouette is unremarkable.  Mild degenerative changes thoracic spine.  No acute infiltrate or pulmonary edema.  Mild tenting of the right hemidiaphragm.  Mild left basilar atelectasis or scarring.  IMPRESSION: No acute infiltrate or pulmonary edema.  Left basilar atelectasis or scarring.  Original Report Authenticated By: Natasha Mead, M.D.   X-ray Knee Left Port  08/02/2011  *RADIOLOGY REPORT*  Clinical Data: History of post left knee arthroplasty.  PORTABLE LEFT KNEE - 1-2 VIEW  Comparison: 09/23/2010.  Findings: Total knee arthroplasty procedure has been performed.  No dislocation is seen.  There is expected relationship between hardware components with no disruption of hardware evident.  Air and soft tissue swelling are present at the operative sites.  IMPRESSION: Post total knee arthroplasty procedure.  Original Report Authenticated By: Crawford Givens, M.D.    Disposition: Final discharge disposition not confirmed  Discharge Orders    Future Orders Please Complete By Expires   Diet general      Call MD / Call 911      Comments:   If you experience chest pain or shortness of breath, CALL 911 and be transported to the hospital emergency room.  If you develope a fever above 101 F, pus (white drainage) or increased drainage or redness at the wound, or calf pain, call your surgeon's office.    Discharge instructions      Comments:   Change dressing in 3 days and reapply fresh  dressing, unless you have a splint (half cast).  If you have a splint/cast, just leave in place until your follow-up appointment.    Keep wounds dry for 3 weeks.  Leave steri-strips in place on skin.  Do not apply lotion or anything to the wound.   Constipation Prevention      Comments:   Drink plenty of fluids.  Prune juice may be helpful.  You may use a stool softener, such as Colace (over the counter) 100 mg twice a day.  Use MiraLax (over the counter) for constipation as needed.   CPM      Comments:   Continuous passive motion machine (CPM):      Use the CPM from 0 to 60 for 6 hours per day.      You may increase by 10 degrees per day.  You may break it up into 2 or 3 sessions per day.      Use CPM for 2 weeks or until you are told to stop.   TED hose      Comments:   Use stockings (TED hose) for 2 weeks on both leg(s).  You may remove them at night for sleeping.   Change dressing      Comments:   Change dressing in three days, then change the dressing daily with sterile 4 x 4 inch gauze dressing.  You may clean the incision with alcohol prior to redressing.   Do not put a pillow under the knee. Place it under the heel.         Follow-up Information    Follow up with Kimberlye Dilger P, MD in 2 weeks.   Contact information:   [facility] [address], [address] [place] [address] [phone]           Signed: Eulas Post 08/05/2011, 10:13 AM   And I Levi Aland

## 2011-08-05 NOTE — Progress Notes (Signed)
CSW left second message with pt insurance company regarding snf authorization. .Clinical social worker continuing to follow pt to assist with pt dc plans and further csw needs.   Catha Gosselin, Theresia Majors  [phone] .08/05/2011 1:56pm

## 2011-08-05 NOTE — Progress Notes (Signed)
Occupational Therapy Treatment Patient Details Name: [name] MRN: [id number] DOB: [birth year] Today's Date: 08/05/2011 Time: 4098-1191 OT Time Calculation (min): 40 min  OT Assessment / Plan / Recommendation Comments on Treatment Session Pt. very motivated to improve functionally. Pt. agreeable now to ST-SNF stay to increase functional independence with ADls and increase mobility level    Follow Up Recommendations  Skilled nursing facility       Equipment Recommendations  Defer to next venue       Frequency Min 2X/week   Plan Discharge plan needs to be updated    Precautions / Restrictions Precautions Precautions: Knee Required Braces or Orthoses: Knee Immobilizer - Right Knee Immobilizer - Right: Discontinue post op day 2 Knee Immobilizer - Left: Discontinue post op day 2 Restrictions Weight Bearing Restrictions: Yes LLE Weight Bearing: Weight bearing as tolerated   Pertinent Vitals/Pain 7/10 left knee    ADL  Grooming: Performed;Wash/dry hands;Set up;Supervision/safety Where Assessed - Grooming: Unsupported standing Toilet Transfer: Performed;Moderate assistance Toilet Transfer Method: Sit to stand Toilet Transfer Equipment: Bedside commode Toileting - Clothing Manipulation and Hygiene: Performed;Moderate assistance Where Assessed - Toileting Clothing Manipulation and Hygiene: Standing Transfers/Ambulation Related to ADLs: Min assist and mod verbal cues for sequencing ADL Comments: Pt. agreeable to ST-rehab due to pt. progressing slowly and requriing assist with mobility and ADLs.      OT Goals Acute Rehab OT Goals OT Goal Formulation: With patient Time For Goal Achievement: 08/10/11 Potential to Achieve Goals: Good ADL Goals Pt Will Transfer to Toilet: with supervision;with DME;Ambulation;3-in-1;with caregiver independent in assisting ADL Goal: Toilet Transfer - Progress: Progressing toward goals Pt Will Perform Toileting - Clothing Manipulation: with  modified independence;Standing ADL Goal: Toileting - Clothing Manipulation - Progress: Progressing toward goals Pt Will Perform Toileting - Hygiene: with modified independence;Standing at 3-in-1/toilet ADL Goal: Toileting - Hygiene - Progress: Progressing toward goals  Visit Information  Last OT Received On: 08/05/11 Assistance Needed: +1          Cognition  Overall Cognitive Status: Appears within functional limits for tasks assessed/performed Arousal/Alertness: Awake/alert Orientation Level: Appears intact for tasks assessed Behavior During Session: [facility] for tasks performed    Mobility Bed Mobility Bed Mobility: Not assessed (pt. in chair) Transfers Transfers: Sit to Stand;Stand to Sit Sit to Stand: 2: Max assist Stand to Sit: 4: Min assist;With upper extremity assist;With armrests;To chair/3-in-1 Details for Transfer Assistance: Pt. with mod verbal cues provided for safe hand placement and technique         End of Session OT - End of Session Equipment Utilized During Treatment: Gait belt Activity Tolerance: Patient tolerated treatment well Patient left: in chair;with call bell/phone within reach Nurse Communication: Mobility status;Other (comment);Weight bearing status   Yazhini Mcaulay, OTR/L Pager [phone] 08/05/2011, 10:34 AM

## 2011-08-05 NOTE — Progress Notes (Signed)
CSW submitted clinicals for pasarr as well as [employer] Authorization and left messages for facilities that pt is interested in. Pt is medically stable for discharge.   .Clinical social worker continuing to follow pt to assist with pt dc plans and further csw needs.  Catha Gosselin, Theresia Majors  [phone] .08/05/2011 1144am

## 2011-08-05 NOTE — Clinical Social Work Placement (Addendum)
    Clinical Social Work Department CLINICAL SOCIAL WORK PLACEMENT NOTE 08/05/2011  Patient:  CAMERA, KRIENKE  Account Number:  [id number] Admit date:  08/02/2011  Clinical Social Worker:  Doree Albee  Date/time:  08/05/2011 11:18 AM  Clinical Social Work is seeking post-discharge placement for this patient at the following level of care:   SKILLED NURSING   (*CSW will update this form in Epic as items are completed)   08/05/2011  Patient/family provided with [facility] Department of Clinical Social Work's list of facilities offering this level of care within the geographic area requested by the patient (or if unable, by the patient's family).  08/05/2011  Patient/family informed of their freedom to choose among providers that offer the needed level of care, that participate in Medicare, Medicaid or managed care program needed by the patient, have an available bed and are willing to accept the patient.  08/05/2011  Patient/family informed of [facility]' ownership interest in [facility], as well as of the fact that they are under no obligation to receive care at this facility.  PASARR submitted to [employer] on 08/05/2011 PASARR number received from EDS on 08/05/2011   FL2 transmitted to all facilities in geographic area requested by pt/family on  08/05/2011 FL2 transmitted to all facilities within larger geographic area on   Patient informed that his/her managed care company has contracts with or will negotiate with  certain facilities, including the following:     Patient/family informed of bed offers received:  08/05/2011  Patient chooses bed at [facility]  Physician recommends and patient chooses bed at    Patient to be transferred to [facility]  on  08/05/2011  Patient to be transferred to facility by [facility]  The following physician request were entered in Epic:   Additional Comments:

## 2011-08-05 NOTE — Progress Notes (Signed)
Physical Therapy Treatment Note   08/05/11 1124  PT Visit Information  Last PT Received On 08/05/11  Assistance Needed +2  PT/OT Co-Evaluation/Treatment (only for sit --> stand)  PT Time Calculation  PT Start Time 1124  PT Stop Time 1154  PT Time Calculation (min) 30 min  Subjective Data  Subjective pt received sitting up in chair with report "I just threw up in this sheet." patient with nausea, RN aware and provided pain medicine.  Precautions  Precautions Knee  Knee Immobilizer - Right Discontinue post op day 2  Restrictions  LLE Weight Bearing WBAT  Cognition  Overall Cognitive Status Appears within functional limits for tasks assessed/performed  Arousal/Alertness Awake/alert  Orientation Level Appears intact for tasks assessed  Behavior During Session [facility] for tasks performed  Bed Mobility  Bed Mobility Not assessed  Transfers  Sit to Stand 2: Max assist  Stand to Sit With armrests;To chair/3-in-1  Details for Transfer Assistance max v/c's for hand placement to push up with bilat UEs  Ambulation/Gait  Ambulation/Gait Assistance 4: Min assist  Ambulation Distance (Feet) 40 Feet  Assistive device Rolling walker  Ambulation/Gait Assistance Details v/c's to increase L LE WBing  Gait Pattern Step-to pattern;Decreased step length - left;Decreased stance time - left;Antalgic  Gait velocity extremely slow  Exercises  Exercises Total Joint  Total Joint Exercises  Quad Sets Left;10 reps;Supine  Heel Slides AAROM;Left;15 reps;Seated  PT - End of Session  Equipment Utilized During Treatment Gait belt  Activity Tolerance Patient limited by fatigue  Patient left in chair;with call bell/phone within reach  Nurse Communication Mobility status  PT - Assessment/Plan  Comments on Treatment Session Spoke with OT and [place], [place] re: patient's strong benefit from SNF due to minimal progress. Grandson present and reports "She needs to go to rehab." [employer], CSW and she is now  working on placement. Patient agreeable. Patient con't to tolerate minimal L knee ROM and demonstrates generalized weakness. Patient strongly encourage to eat and provided with ensure due to patient reports "I have no appetite"  PT Plan Discharge plan needs to be updated;Frequency needs to be updated  PT Frequency 7X/week  Follow Up Recommendations Skilled nursing facility  Equipment Recommended Defer to next venue  Acute Rehab PT Goals  Time For Goal Achievement 08/10/11  PT Goal: Sit to Stand - Progress Progressing toward goal  PT Goal: Ambulate - Progress Progressing toward goal  PT Goal: Perform Home Exercise Program - Progress Progressing toward goal    Pain: pt did not rate L knee  Lewis Shock, PT, DPT Pager #: [phone] Office #: [phone]

## 2011-08-05 NOTE — Progress Notes (Signed)
CSW met with pt yesterday evening along with pt daughter who stated 24 hour care would be provided for pt and declined snf placement. Please see note 08/04/2011.  Today, CSW was informed today that patient was refusing to return home and wanted to go to snf in [place], [place]. CSW spoke with pt and pt grandson who confirmed. CSW unable to reach patient daughter due to phone being disconnected. CSW initiating snf search to assist with pt dc plans.   Please see following placement note.   Catha Gosselin, Theresia Majors  [phone] .08/05/2011 11:18am

## 2011-08-05 NOTE — Progress Notes (Signed)
ANTICOAGULATION CONSULT NOTE - Follow up Consult  Pharmacy Consult for Coumadin Indication: VTE prophylaxis  Allergies  Allergen Reactions  . Penicillins     REACTION: swelling , whelps    Patient Measurements: Height: 5\' 6"  (167.6 cm) (from preadmit 07/27/11) Weight: 222 lb 4.8 oz (100.835 kg) (from 07/27/11 preadmit) IBW/kg (Calculated) : 59.3    Vital Signs: Temp: 98.8 F (37.1 C) (05/24 0523) BP: 132/63 mmHg (05/24 0523) Pulse Rate: 77  (05/24 0523)  Labs:  Basename 08/05/11 0700 08/04/11 0526 08/03/11 0540  HGB 9.7* 9.8* --  HCT 29.1* 30.2* 32.4*  PLT 217 213 204  APTT -- -- --  LABPROT 21.5* 17.6* 14.6  INR 1.83* 1.42 1.12  HEPARINUNFRC -- -- --  CREATININE -- -- 0.44*  CKTOTAL -- -- --  CKMB -- -- --  TROPONINI -- -- --    Estimated Creatinine Clearance: 75 ml/min (by C-G formula based on Cr of 0.44).   Medical History: Past Medical History  Diagnosis Date  . Cholelithiasis 07/1996  . Hyperlipidemia 03/1999    takes Lipitor nightly  . NSVD (normal spontaneous vaginal delivery)     x 5  . Allergy 08/26/2002    [facility] hemoptysis actually allergic rhinitis  . Hypertension 06/1999    takes Atenolol and Diltiazem daily  . CAD (coronary artery disease)     1 stent  . Peripheral edema     takes Furosemide daily  . Pneumonia     hx of--as a child  . Osteoarthritis 07/1996  . Joint pain   . Joint swelling   . Back pain     pt states from knee pain  . GERD (gastroesophageal reflux disease)     takes Prevacid daily  . Urinary frequency   . History of kidney stones   . Depression     takes Zoloft daily  . Primary osteoarthritis of left knee 09/23/2010    Per Dr. Dion Saucier with [facility]   . Osteoarthritis of left knee 08/02/2011    Medications:  Prescriptions prior to admission  Medication Sig Dispense Refill  . aspirin 81 MG tablet Take 81 mg by mouth daily.        Marland Kitchen atenolol (TENORMIN) 25 MG tablet Take 25 mg by mouth daily.      Marland Kitchen  atorvastatin (LIPITOR) 80 MG tablet Take 80 mg by mouth daily.      . cholecalciferol (VITAMIN D) 1000 UNITS tablet Take 1,000 Units by mouth 2 (two) times daily.        Marland Kitchen diltiazem (DILACOR XR) 240 MG 24 hr capsule Take 240 mg by mouth daily.      . furosemide (LASIX) 20 MG tablet Take 20 mg by mouth daily.      . lansoprazole (PREVACID) 15 MG capsule Take 15 mg by mouth daily.      . sertraline (ZOLOFT) 50 MG tablet Take 50 mg by mouth daily.      . vitamin B-12 (CYANOCOBALAMIN) 1000 MCG tablet Take 1,000 mcg by mouth daily.      . vitamin C (ASCORBIC ACID) 500 MG tablet Take 500 mg by mouth daily.      Marland Kitchen DISCONTD: HYDROcodone-acetaminophen (VICODIN) 5-500 MG per tablet Take 1 tablet by mouth every 6 (six) hours as needed. For pain      . DISCONTD: naproxen sodium (ANAPROX) 220 MG tablet Take 220 mg by mouth 2 (two) times daily with a meal.       Scheduled:     .  atenolol  25 mg Oral Daily  . atorvastatin  80 mg Oral q1800  . cholecalciferol  1,000 Units Oral BID  . diltiazem  240 mg Oral Daily  . docusate sodium  100 mg Oral BID  . enoxaparin  30 mg Subcutaneous Q12H  . furosemide  20 mg Oral Daily  . pantoprazole  20 mg Oral Q1200  . senna  1 tablet Oral BID  . sertraline  50 mg Oral Daily  . vitamin B-12  1,000 mcg Oral Daily  . vitamin C  500 mg Oral Daily  . warfarin  7.5 mg Oral ONCE-1800  . Warfarin - Pharmacist Dosing Inpatient   Does not apply q1800    Assessment: 76 y.o. Female POD#3 s/p L TKA started Coumadin 5/21 for VTE prophylaxis. Also on lovenox 30mg  SQ q12h for VTE prophylaxis until INR = or >1.8 per MD's order. Today her INR =1.83.  H/H 9.7/29.1,  pltc 217K  No bleeding reported. Plan for discharge to SNF tomorrow. MD wrote coumadin 5mg  daily for discharge, which is an appropriate dose for her.  Goal of Therapy:  INR 2-3   Plan:  - Coumadin 5mg  po today x1 - D/C lovenox since INR = or >1.8 per MD's order. - f/u AM INR and discharge plan  Bayard Hugger,  PharmD, BCPS  Clinical Pharmacist  Pager: [phone]   08/05/2011,1:50 PM

## 2011-08-05 NOTE — Progress Notes (Signed)
     Subjective:  Patient reports pain as mild.  Still slow with PT.  Will need SNF.  Objective:   VITALS:   Filed Vitals:   08/04/11 0523 08/04/11 1400 08/04/11 2122 08/05/11 0523  BP: 144/67 155/73 131/65 132/63  Pulse: 84 94 99 77  Temp: 98.2 F (36.8 C) 101 F (38.3 C) 100.8 F (38.2 C) 98.8 F (37.1 C)  TempSrc: Oral     Resp: 16 16 18 18   Height:      Weight:      SpO2: 97% 95% 98% 95%    Neurologically intact Incision: dressing C/D/I  LABS  Results for orders placed during the hospital encounter of 08/02/11 (from the past 24 hour(s))  PROTIME-INR     Status: Abnormal   Collection Time   08/05/11  7:00 AM      Component Value Range   Prothrombin Time 21.5 (*) 11.6 - 15.2 (seconds)   INR 1.83 (*) 0.00 - 1.49   CBC     Status: Abnormal   Collection Time   08/05/11  7:00 AM      Component Value Range   WBC 12.5 (*) 4.0 - 10.5 (K/uL)   RBC 3.36 (*) 3.87 - 5.11 (MIL/uL)   Hemoglobin 9.7 (*) 12.0 - 15.0 (g/dL)   HCT 40.9 (*) 81.1 - 46.0 (%)   MCV 86.6  78.0 - 100.0 (fL)   MCH 28.9  26.0 - 34.0 (pg)   MCHC 33.3  30.0 - 36.0 (g/dL)   RDW 91.4  78.2 - 95.6 (%)   Platelets 217  150 - 400 (K/uL)    No results found.  Assessment/Plan: 3 Days Post-Op   Principal Problem:  *Osteoarthritis of left knee Active Problems:  Primary osteoarthritis of left knee   Advance diet Up with therapy Discharge to SNF sat if possible.   Laporscha Linehan P 08/05/2011, 12:16 PM   Teryl Lucy, MD [phone] pager

## 2011-08-05 NOTE — Progress Notes (Signed)
CSW confirmed with facility that patient had received uathorization for snf. CSW was unable to reach pt insurance company regarding ambulance authorization. CSW spoke with department director who stated that pt had been discharged and would need to go by ambulance without the guarantee of payment or by family transportation. CSW notified patient who stated she would like to arrange transportation through her family. CSW spoke with rn who agreed to help pt arrange transportation with family or call ambulance for transportation.   CSW informed ed csw of situation and direction by csw department director.   Catha Gosselin, Theresia Majors  [phone] .08/05/2011 1734pm

## 2011-08-13 ENCOUNTER — Encounter: Payer: Self-pay | Admitting: Internal Medicine

## 2011-08-17 ENCOUNTER — Telehealth: Payer: Self-pay | Admitting: Family Medicine

## 2011-08-17 NOTE — Telephone Encounter (Signed)
Left message on answering machine to call back.

## 2011-08-17 NOTE — Telephone Encounter (Signed)
Pt will be getting out of rehab place soon for her knee. The Dr. She has seen there says she probably should get on some type of meds for her nerves if possible. The Daughter was calling to see if you could prescribe this for her ???

## 2011-08-17 NOTE — Telephone Encounter (Signed)
See if they'll start the med before she is discharged with f/u here after discharge, or get a follow up appointment with me to talk about the medicine.  He other meds, pain level, etc would influence what she could take.  Thanks.

## 2011-08-18 NOTE — Telephone Encounter (Signed)
Spoke pts daughter April appt scheduled Dr Para March 08/22/11.

## 2011-08-22 ENCOUNTER — Ambulatory Visit (INDEPENDENT_AMBULATORY_CARE_PROVIDER_SITE_OTHER): Payer: Medicare Other | Admitting: Family Medicine

## 2011-08-22 ENCOUNTER — Encounter: Payer: Self-pay | Admitting: Family Medicine

## 2011-08-22 VITALS — BP 130/70 | HR 61 | Temp 98.2°F | Wt 212.0 lb

## 2011-08-22 DIAGNOSIS — M1710 Unilateral primary osteoarthritis, unspecified knee: Secondary | ICD-10-CM

## 2011-08-22 DIAGNOSIS — M79609 Pain in unspecified limb: Secondary | ICD-10-CM

## 2011-08-22 DIAGNOSIS — F341 Dysthymic disorder: Secondary | ICD-10-CM

## 2011-08-22 DIAGNOSIS — M1712 Unilateral primary osteoarthritis, left knee: Secondary | ICD-10-CM

## 2011-08-22 DIAGNOSIS — IMO0002 Reserved for concepts with insufficient information to code with codable children: Secondary | ICD-10-CM

## 2011-08-22 DIAGNOSIS — M79643 Pain in unspecified hand: Secondary | ICD-10-CM

## 2011-08-22 MED ORDER — DIAZEPAM 5 MG PO TABS: 2.5000 mg | ORAL_TABLET | Freq: Two times a day (BID) | ORAL | Status: AC | PRN

## 2011-08-22 MED ORDER — SERTRALINE HCL 100 MG PO TABS: 100.0000 mg | ORAL_TABLET | Freq: Every day | ORAL | Status: DC

## 2011-08-22 NOTE — Assessment & Plan Note (Signed)
Much improved after TKR.

## 2011-08-22 NOTE — Assessment & Plan Note (Signed)
She'll f/u with ortho.  

## 2011-08-22 NOTE — Progress Notes (Signed)
Anxiety- per patient likely related to recent L TKR, R hand pain (prev but likely exacerbated compensating for her L knee) and her husbands current illnesses. "I'm worried about him."  She did well with surgery and just got out of rehab.  She'll get shaky, tearful.  On SSRI prev, asking about options.   No SI/HI.   Meds, vitals, and allergies reviewed.   ROS: See HPI.  Otherwise, noncontributory.  Nad, but slightly worried appearing rrr ctab Abd soft Speech at baseline L knee with vertical scar noted, healing Able to rise from chair more easily than prev due to dec in pain.

## 2011-08-22 NOTE — Patient Instructions (Signed)
Take 100mg  of sertraline everyday and use the valium as needed, if you get very anxious.  It can make you sleepy.  Call me if that doesn't help.  Take care.

## 2011-08-22 NOTE — Assessment & Plan Note (Signed)
Inc SSRI and use valium sparingly for refractory sx.  Sedation caution given and she understands. She agrees with plan.

## 2011-09-22 ENCOUNTER — Other Ambulatory Visit: Payer: Self-pay

## 2011-09-22 MED ORDER — ATORVASTATIN CALCIUM 80 MG PO TABS: 80.0000 mg | ORAL_TABLET | Freq: Every day | ORAL | Status: DC

## 2011-09-22 NOTE — Telephone Encounter (Signed)
[facility] [address] request Lipitor # [phone] x 0 with note to call for appt.

## 2011-10-11 ENCOUNTER — Other Ambulatory Visit: Payer: Self-pay | Admitting: Orthopedic Surgery

## 2011-10-13 ENCOUNTER — Encounter: Payer: Self-pay | Admitting: *Deleted

## 2011-10-13 ENCOUNTER — Other Ambulatory Visit: Payer: Self-pay | Admitting: *Deleted

## 2011-10-13 MED ORDER — ATORVASTATIN CALCIUM 80 MG PO TABS: 80.0000 mg | ORAL_TABLET | Freq: Every day | ORAL | Status: DC

## 2011-10-13 NOTE — Telephone Encounter (Signed)
Patient scheduled for labs and F/U OV.

## 2011-10-13 NOTE — Telephone Encounter (Signed)
Needs OV with labs.

## 2011-10-13 NOTE — Telephone Encounter (Signed)
Faxed refill request.  It looks like the patient was asked to make an appt at the last refill.  Does she need to come in?

## 2011-10-17 ENCOUNTER — Other Ambulatory Visit (INDEPENDENT_AMBULATORY_CARE_PROVIDER_SITE_OTHER): Payer: Medicare Other

## 2011-10-17 DIAGNOSIS — E749 Disorder of carbohydrate metabolism, unspecified: Secondary | ICD-10-CM

## 2011-10-17 DIAGNOSIS — Z79899 Other long term (current) drug therapy: Secondary | ICD-10-CM

## 2011-10-17 DIAGNOSIS — E785 Hyperlipidemia, unspecified: Secondary | ICD-10-CM

## 2011-10-17 DIAGNOSIS — I10 Essential (primary) hypertension: Secondary | ICD-10-CM

## 2011-10-17 DIAGNOSIS — E559 Vitamin D deficiency, unspecified: Secondary | ICD-10-CM

## 2011-10-17 DIAGNOSIS — I2510 Atherosclerotic heart disease of native coronary artery without angina pectoris: Secondary | ICD-10-CM

## 2011-10-17 LAB — HEPATIC FUNCTION PANEL
Albumin: 3.6 g/dL (ref 3.5–5.2)
Total Bilirubin: 0.4 mg/dL (ref 0.3–1.2)

## 2011-10-17 LAB — CBC WITH DIFFERENTIAL/PLATELET
Eosinophils Absolute: 0.1 10*3/uL (ref 0.0–0.7)
Hemoglobin: 12.2 g/dL (ref 12.0–15.0)
Lymphs Abs: 1.9 10*3/uL (ref 0.7–4.0)
Monocytes Absolute: 0.5 10*3/uL (ref 0.1–1.0)
Neutrophils Relative %: 70.1 % (ref 43.0–77.0)
Platelets: 247 10*3/uL (ref 150.0–400.0)
RBC: 4.53 Mil/uL (ref 3.87–5.11)
WBC: 8.4 10*3/uL (ref 4.5–10.5)

## 2011-10-17 LAB — BASIC METABOLIC PANEL
BUN: 8 mg/dL (ref 6–23)
Calcium: 9.3 mg/dL (ref 8.4–10.5)
GFR: 206.74 mL/min (ref >60.00)
Glucose, Bld: 104 mg/dL — ABNORMAL HIGH (ref 70–99)
Sodium: 144 mEq/L (ref 135–145)

## 2011-10-17 LAB — LIPID PANEL
Cholesterol: 161 mg/dL (ref 0–200)
LDL Cholesterol: 98 mg/dL (ref 0–99)
Total CHOL/HDL Ratio: 3
Triglycerides: 78 mg/dL (ref 0.0–149.0)
VLDL: 15.6 mg/dL (ref 0.0–40.0)

## 2011-10-17 LAB — TSH: TSH: 1.84 u[IU]/mL (ref 0.35–5.50)

## 2011-10-17 LAB — HEMOGLOBIN A1C: Hgb A1c MFr Bld: 5.9 % (ref 4.6–6.5)

## 2011-10-18 ENCOUNTER — Encounter (HOSPITAL_COMMUNITY): Payer: Self-pay | Admitting: Pharmacy Technician

## 2011-10-24 ENCOUNTER — Encounter: Payer: Self-pay | Admitting: Family Medicine

## 2011-10-24 ENCOUNTER — Ambulatory Visit (INDEPENDENT_AMBULATORY_CARE_PROVIDER_SITE_OTHER): Payer: Medicare Other | Admitting: Family Medicine

## 2011-10-24 VITALS — BP 144/72 | HR 54 | Temp 98.3°F | Wt 201.8 lb

## 2011-10-24 DIAGNOSIS — E785 Hyperlipidemia, unspecified: Secondary | ICD-10-CM

## 2011-10-24 DIAGNOSIS — E559 Vitamin D deficiency, unspecified: Secondary | ICD-10-CM

## 2011-10-24 DIAGNOSIS — K219 Gastro-esophageal reflux disease without esophagitis: Secondary | ICD-10-CM

## 2011-10-24 DIAGNOSIS — I10 Essential (primary) hypertension: Secondary | ICD-10-CM

## 2011-10-24 DIAGNOSIS — E749 Disorder of carbohydrate metabolism, unspecified: Secondary | ICD-10-CM

## 2011-10-24 MED ORDER — LANSOPRAZOLE 15 MG PO CPDR: 15.0000 mg | DELAYED_RELEASE_CAPSULE | Freq: Two times a day (BID) | ORAL | Status: DC

## 2011-10-24 NOTE — Assessment & Plan Note (Signed)
Inc to BID PPI and avoid trigger foods.  D/w pt.  She agrees.  F/u prn, follow clinically.

## 2011-10-24 NOTE — Patient Instructions (Addendum)
Take the prevacid/lansoprazole twice a day and cut back on OJ.  See if that helps with the stomach troubles.  Notify me if not better.  Get your husband a 30 minute visit.  Take care.

## 2011-10-24 NOTE — Assessment & Plan Note (Signed)
Resolved

## 2011-10-24 NOTE — Assessment & Plan Note (Signed)
A1c controlled, no meds, cut back on juice and follow periodically.  D/w pt.

## 2011-10-24 NOTE — Assessment & Plan Note (Signed)
Lipids acceptable, continue current meds.

## 2011-10-24 NOTE — Progress Notes (Signed)
Hyperglycemia.  A1c controlled at 5.9.  No meds for sugar elevation.  Diet discussed.  Labs d/w pt.    H/o GERD sx with heartburn and occ food regurgitation.  Going on since her last operation. No blood in vomitus.   Rare diarrhea, no blood in stool. No jaundice.  Has been eating a lot of acid foods, tomatoes and OJ.  Hypertension:    Using medication without problems or lightheadedness: yes Chest pain with exertion:no Edema:no Short of breath:no  Elevated Cholesterol: Using medications without problems:yes Muscle aches: no Diet compliance:yes Exercise: at tolerated due to knee pain.   H/o postop anemia and labs reviewed. Anemia resolved.   Knee pain improved after knee replacement.  Up and down steps w/o L knee pain.  She's getting ready to have R knee replacement.  Vit D wnl now.   PMH and SH reviewed.   Vital signs, Meds and allergies reviewed.  ROS: See HPI.  Otherwise nontributory.   GEN: nad, alert and oriented HEENT: mucous membranes moist NECK: supple w/o LA CV: rrr.  PULM: ctab, no inc wob ABD: soft, +bs EXT: no edema SKIN: no acute rash

## 2011-10-24 NOTE — Assessment & Plan Note (Signed)
BP acceptable, continue current meds.  Some elevation may be due to pain from R knee.  I don't want to induce hypotension.

## 2011-10-25 ENCOUNTER — Encounter (HOSPITAL_COMMUNITY): Payer: Self-pay

## 2011-10-25 ENCOUNTER — Encounter (HOSPITAL_COMMUNITY)
Admission: RE | Admit: 2011-10-25 | Discharge: 2011-10-25 | Disposition: A | Discharge status: Home / Self Care | Payer: Medicare Other | Source: Ambulatory Visit | Attending: Orthopedic Surgery | Admitting: Orthopedic Surgery

## 2011-10-25 LAB — CBC
HCT: 38.9 % (ref 36.0–46.0)
Hemoglobin: 12.6 g/dL (ref 12.0–15.0)
MCH: 26.8 pg (ref 26.0–34.0)
MCHC: 32.4 g/dL (ref 30.0–36.0)
MCV: 82.8 fL (ref 78.0–100.0)
RDW: 14.4 % (ref 11.5–15.5)

## 2011-10-25 LAB — URINALYSIS, ROUTINE W REFLEX MICROSCOPIC
Glucose, UA: NEGATIVE mg/dL
Ketones, ur: 15 mg/dL — AB
Nitrite: NEGATIVE
Specific Gravity, Urine: 1.031 — ABNORMAL HIGH (ref 1.005–1.030)
pH: 5.5 (ref 5.0–8.0)

## 2011-10-25 LAB — BASIC METABOLIC PANEL
BUN: 10 mg/dL (ref 6–23)
CO2: 31 mEq/L (ref 19–32)
Chloride: 101 mEq/L (ref 96–112)
Creatinine, Ser: 0.58 mg/dL (ref 0.50–1.10)
GFR calc Af Amer: >90 mL/min (ref >90)
Glucose, Bld: 109 mg/dL — ABNORMAL HIGH (ref 70–99)
Potassium: 3.7 mEq/L (ref 3.5–5.1)

## 2011-10-25 LAB — TYPE AND SCREEN

## 2011-10-25 LAB — URINE MICROSCOPIC-ADD ON

## 2011-10-25 LAB — SURGICAL PCR SCREEN: MRSA, PCR: NEGATIVE

## 2011-10-25 NOTE — Pre-Procedure Instructions (Addendum)
22 [name]  10/25/2011   Your procedure is scheduled on:  11/01/11  Report to [facility] at 900 AM.  Call this number if you have problems the morning of surgery: [phone]   Remember:   Do not eat food or drink:After Midnight.  .  Take these medicines the morning of surgery with A SIP OF WATER: pain med, valium, diltiazem, prevacid, zoloft  STOP  Aspirin,    Do not wear jewelry, make-up or nail polish.  Do not wear lotions, powders, or perfumes. You may wear deodorant.  Do not shave 48 hours prior to surgery. Men may shave face and neck.  Do not bring valuables to the hospital.  Contacts, dentures or bridgework may not be worn into surgery.  Leave suitcase in the car. After surgery it may be brought to your room.  For patients admitted to the hospital, checkout time is 11:00 AM the day of discharge.   Patients discharged the day of surgery will not be allowed to drive home.  Name and phone number of your driver: george spouse  [phone]  Special Instructions: Incentive Spirometry - Practice and bring it with you on the day of surgery. and CHG Shower Use Special Wash: 1/2 bottle night before surgery and 1/2 bottle morning of surgery.   Please read over the following fact sheets that you were given: Pain Booklet, Coughing and Deep Breathing, Blood Transfusion Information, MRSA Information and Surgical Site Infection Prevention

## 2011-10-25 NOTE — Progress Notes (Addendum)
Last office notes req'd from dr Lady Gary at [facility] [place].cxr 5/13

## 2011-10-26 NOTE — Consult Note (Signed)
Anesthesia chart review: Patient is a 76 year old female scheduled for right total knee arthroplasty by Dr. Dion Saucier on 11/01/2011. History includes non-smoker, obesity with BMI 32.7, CAD s/p PTCA/stent LAD > 10 year ago, depression, HLD, peripheral edema, GERD, OA, kidney stones, HTN, facial cosmetic surgery. Her PCP is Dr. Crawford Givens.  Her Cardiologist is Dr. Harold Hedge at [facility].  He saw her on 07/04/11 for pre-operative clearance.  Nuclear stress test on 09/30/10 showed no evidence of ischemia or dysrhythmia, normal LV function with EF 64%.  (Reportedly her LAD intervention was > 10 years ago.  There is no cardiac cath report available and [facility] or [facility].)  EKG on 10/25/2011. Showed sinus bradycardia with first-degree AV block and nonspecific ST abnormality.  CXR on 07/27/11 showed no acute infiltrate or pulmonary edema. Left basilar atelectasis or scarring.  Labs noted.  PTT on arrival.  If no acute CV symptoms then anticipate she can proceed as planned.  Shonna Chock, PA-C

## 2011-10-31 MED ORDER — CEFAZOLIN SODIUM-DEXTROSE 2-3 GM-% IV SOLR
2.0000 g | INTRAVENOUS | Status: DC
  Filled 2011-10-31: qty 50

## 2011-10-31 NOTE — Progress Notes (Signed)
Time change called to pt. To arrive @8 :30 AM.

## 2011-11-01 ENCOUNTER — Encounter (HOSPITAL_COMMUNITY)
Admission: RE | Disposition: A | Discharge status: Home Health | Payer: Self-pay | Source: Ambulatory Visit | Attending: Orthopedic Surgery

## 2011-11-01 ENCOUNTER — Ambulatory Visit (HOSPITAL_COMMUNITY): Payer: Medicare Other | Admitting: Vascular Surgery

## 2011-11-01 ENCOUNTER — Inpatient Hospital Stay (HOSPITAL_COMMUNITY): Payer: Medicare Other

## 2011-11-01 ENCOUNTER — Encounter (HOSPITAL_COMMUNITY): Payer: Self-pay | Admitting: Vascular Surgery

## 2011-11-01 ENCOUNTER — Encounter (HOSPITAL_COMMUNITY): Payer: Self-pay | Admitting: Orthopedic Surgery

## 2011-11-01 ENCOUNTER — Inpatient Hospital Stay (HOSPITAL_COMMUNITY)
Admission: RE | Admit: 2011-11-01 | Discharge: 2011-11-07 | DRG: 470 | Disposition: A | Discharge status: Home Health | Payer: Medicare Other | Source: Ambulatory Visit | Attending: Orthopedic Surgery | Admitting: Orthopedic Surgery

## 2011-11-01 DIAGNOSIS — I10 Essential (primary) hypertension: Secondary | ICD-10-CM | POA: Diagnosis present

## 2011-11-01 DIAGNOSIS — Z7982 Long term (current) use of aspirin: Secondary | ICD-10-CM

## 2011-11-01 DIAGNOSIS — M1710 Unilateral primary osteoarthritis, unspecified knee: Principal | ICD-10-CM | POA: Diagnosis present

## 2011-11-01 DIAGNOSIS — Z96659 Presence of unspecified artificial knee joint: Secondary | ICD-10-CM

## 2011-11-01 DIAGNOSIS — Z881 Allergy status to other antibiotic agents status: Secondary | ICD-10-CM

## 2011-11-01 DIAGNOSIS — Z9861 Coronary angioplasty status: Secondary | ICD-10-CM

## 2011-11-01 DIAGNOSIS — K219 Gastro-esophageal reflux disease without esophagitis: Secondary | ICD-10-CM | POA: Diagnosis present

## 2011-11-01 DIAGNOSIS — M109 Gout, unspecified: Secondary | ICD-10-CM | POA: Diagnosis present

## 2011-11-01 DIAGNOSIS — M1711 Unilateral primary osteoarthritis, right knee: Secondary | ICD-10-CM | POA: Diagnosis present

## 2011-11-01 DIAGNOSIS — E66811 Obesity, class 1: Secondary | ICD-10-CM | POA: Diagnosis present

## 2011-11-01 DIAGNOSIS — Z880 Allergy status to penicillin: Secondary | ICD-10-CM

## 2011-11-01 DIAGNOSIS — D62 Acute posthemorrhagic anemia: Secondary | ICD-10-CM | POA: Diagnosis not present

## 2011-11-01 DIAGNOSIS — Z7901 Long term (current) use of anticoagulants: Secondary | ICD-10-CM

## 2011-11-01 DIAGNOSIS — E785 Hyperlipidemia, unspecified: Secondary | ICD-10-CM | POA: Diagnosis present

## 2011-11-01 DIAGNOSIS — I2510 Atherosclerotic heart disease of native coronary artery without angina pectoris: Secondary | ICD-10-CM | POA: Diagnosis present

## 2011-11-01 DIAGNOSIS — E669 Obesity, unspecified: Secondary | ICD-10-CM | POA: Diagnosis present

## 2011-11-01 DIAGNOSIS — Z86718 Personal history of other venous thrombosis and embolism: Secondary | ICD-10-CM

## 2011-11-01 DIAGNOSIS — F329 Major depressive disorder, single episode, unspecified: Secondary | ICD-10-CM | POA: Diagnosis present

## 2011-11-01 DIAGNOSIS — F3289 Other specified depressive episodes: Secondary | ICD-10-CM | POA: Diagnosis present

## 2011-11-01 DIAGNOSIS — Z6832 Body mass index (BMI) 32.0-32.9, adult: Secondary | ICD-10-CM

## 2011-11-01 DIAGNOSIS — Z8249 Family history of ischemic heart disease and other diseases of the circulatory system: Secondary | ICD-10-CM

## 2011-11-01 HISTORY — DX: Personal history of other diseases of the musculoskeletal system and connective tissue: Z87.39

## 2011-11-01 HISTORY — PX: TOTAL KNEE ARTHROPLASTY: SHX125

## 2011-11-01 HISTORY — DX: Unilateral primary osteoarthritis, right knee: M17.11

## 2011-11-01 HISTORY — DX: Other forms of dyspnea: R06.09

## 2011-11-01 HISTORY — DX: Obesity, unspecified: E66.9

## 2011-11-01 HISTORY — DX: Acute embolism and thrombosis of unspecified deep veins of left lower extremity: I82.402

## 2011-11-01 SURGERY — ARTHROPLASTY, KNEE, TOTAL
Anesthesia: General | Site: Knee | Laterality: Right | Wound class: Clean

## 2011-11-01 MED ORDER — SENNA 8.6 MG PO TABS
1.0000 | ORAL_TABLET | Freq: Two times a day (BID) | ORAL | Status: DC
  Administered 2011-11-02 – 2011-11-07 (×10): 8.6 mg via ORAL
  Filled 2011-11-01 (×13): qty 1

## 2011-11-01 MED ORDER — MIDAZOLAM HCL 2 MG/2ML IJ SOLN: 0.5000 mg | Freq: Once | INTRAMUSCULAR | Status: DC | PRN

## 2011-11-01 MED ORDER — ATORVASTATIN CALCIUM 80 MG PO TABS
80.0000 mg | ORAL_TABLET | Freq: Every day | ORAL | Status: DC
  Administered 2011-11-02 – 2011-11-06 (×6): 80 mg via ORAL
  Filled 2011-11-01 (×7): qty 1

## 2011-11-01 MED ORDER — ALUM & MAG HYDROXIDE-SIMETH 200-200-20 MG/5ML PO SUSP: 30.0000 mL | ORAL | Status: DC | PRN

## 2011-11-01 MED ORDER — VITAMIN D3 25 MCG (1000 UNIT) PO TABS
1000.0000 [IU] | ORAL_TABLET | Freq: Two times a day (BID) | ORAL | Status: DC
  Administered 2011-11-02 – 2011-11-07 (×12): 1000 [IU] via ORAL
  Filled 2011-11-01 (×13): qty 1

## 2011-11-01 MED ORDER — ASPIRIN EC 81 MG PO TBEC
81.0000 mg | DELAYED_RELEASE_TABLET | Freq: Every day | ORAL | Status: DC
  Administered 2011-11-02 – 2011-11-07 (×7): 81 mg via ORAL
  Filled 2011-11-01 (×7): qty 1

## 2011-11-01 MED ORDER — ONDANSETRON HCL 4 MG/2ML IJ SOLN
INTRAMUSCULAR | Status: DC | PRN
  Administered 2011-11-01: 4 mg via INTRAVENOUS

## 2011-11-01 MED ORDER — MENTHOL 3 MG MT LOZG
1.0000 | LOZENGE | OROMUCOSAL | Status: DC | PRN
  Filled 2011-11-01: qty 9

## 2011-11-01 MED ORDER — VANCOMYCIN HCL IN DEXTROSE 1-5 GM/200ML-% IV SOLN
1000.0000 mg | Freq: Once | INTRAVENOUS | Status: AC
  Administered 2011-11-01: 1000 mg via INTRAVENOUS

## 2011-11-01 MED ORDER — VITAMIN B-12 1000 MCG PO TABS
1000.0000 ug | ORAL_TABLET | Freq: Every day | ORAL | Status: DC
  Administered 2011-11-02 – 2011-11-07 (×7): 1000 ug via ORAL
  Filled 2011-11-01 (×7): qty 1

## 2011-11-01 MED ORDER — VANCOMYCIN HCL IN DEXTROSE 1-5 GM/200ML-% IV SOLN
1000.0000 mg | Freq: Three times a day (TID) | INTRAVENOUS | Status: AC
  Administered 2011-11-01 – 2011-11-02 (×2): 1000 mg via INTRAVENOUS
  Filled 2011-11-01 (×2): qty 200

## 2011-11-01 MED ORDER — DOCUSATE SODIUM 100 MG PO CAPS: 100.0000 mg | ORAL_CAPSULE | Freq: Two times a day (BID) | ORAL | Status: DC

## 2011-11-01 MED ORDER — PHENOL 1.4 % MT LIQD
1.0000 | OROMUCOSAL | Status: DC | PRN
  Filled 2011-11-01: qty 177

## 2011-11-01 MED ORDER — LACTATED RINGERS IV SOLN
INTRAVENOUS | Status: DC
  Administered 2011-11-01: 10:00:00 via INTRAVENOUS

## 2011-11-01 MED ORDER — METHOCARBAMOL 500 MG PO TABS: 500.0000 mg | ORAL_TABLET | Freq: Four times a day (QID) | ORAL | Status: AC

## 2011-11-01 MED ORDER — DIAZEPAM 5 MG PO TABS
2.5000 mg | ORAL_TABLET | Freq: Two times a day (BID) | ORAL | Status: DC
  Administered 2011-11-02 – 2011-11-07 (×11): 2.5 mg via ORAL
  Filled 2011-11-01 (×11): qty 1

## 2011-11-01 MED ORDER — LABETALOL HCL 5 MG/ML IV SOLN
INTRAVENOUS | Status: DC | PRN
  Administered 2011-11-01 (×2): 2.5 mg via INTRAVENOUS
  Administered 2011-11-01: 5 mg via INTRAVENOUS
  Administered 2011-11-01 (×4): 2.5 mg via INTRAVENOUS

## 2011-11-01 MED ORDER — ZOLPIDEM TARTRATE 5 MG PO TABS: 5.0000 mg | ORAL_TABLET | Freq: Every evening | ORAL | Status: DC | PRN

## 2011-11-01 MED ORDER — SODIUM CHLORIDE 0.9 % IR SOLN
Status: DC | PRN
  Administered 2011-11-01: 1000 mL

## 2011-11-01 MED ORDER — METOCLOPRAMIDE HCL 10 MG PO TABS: 5.0000 mg | ORAL_TABLET | Freq: Three times a day (TID) | ORAL | Status: DC | PRN

## 2011-11-01 MED ORDER — METHOCARBAMOL 100 MG/ML IJ SOLN
500.0000 mg | Freq: Four times a day (QID) | INTRAVENOUS | Status: DC | PRN
  Filled 2011-11-01: qty 5

## 2011-11-01 MED ORDER — PROMETHAZINE HCL 25 MG/ML IJ SOLN: 6.2500 mg | INTRAMUSCULAR | Status: DC | PRN

## 2011-11-01 MED ORDER — BUPIVACAINE HCL (PF) 0.25 % IJ SOLN: INTRAMUSCULAR | Status: DC | PRN

## 2011-11-01 MED ORDER — FENTANYL CITRATE 0.05 MG/ML IJ SOLN
50.0000 ug | INTRAMUSCULAR | Status: DC | PRN
  Administered 2011-11-01: 100 ug via INTRAVENOUS

## 2011-11-01 MED ORDER — MIDAZOLAM HCL 2 MG/2ML IJ SOLN
INTRAMUSCULAR | Status: AC
  Filled 2011-11-01: qty 2

## 2011-11-01 MED ORDER — ONDANSETRON HCL 4 MG PO TABS: 4.0000 mg | ORAL_TABLET | Freq: Four times a day (QID) | ORAL | Status: DC | PRN

## 2011-11-01 MED ORDER — FUROSEMIDE 20 MG PO TABS
20.0000 mg | ORAL_TABLET | Freq: Every day | ORAL | Status: DC
  Administered 2011-11-02 – 2011-11-07 (×6): 20 mg via ORAL
  Filled 2011-11-01 (×7): qty 1

## 2011-11-01 MED ORDER — FENTANYL CITRATE 0.05 MG/ML IJ SOLN
INTRAMUSCULAR | Status: AC
  Filled 2011-11-01: qty 2

## 2011-11-01 MED ORDER — SORBITOL 70 % SOLN
30.0000 mL | Freq: Every day | Status: DC | PRN
  Filled 2011-11-01: qty 30

## 2011-11-01 MED ORDER — PANTOPRAZOLE SODIUM 20 MG PO TBEC
20.0000 mg | DELAYED_RELEASE_TABLET | Freq: Every day | ORAL | Status: DC
  Administered 2011-11-03 – 2011-11-07 (×5): 20 mg via ORAL
  Filled 2011-11-01 (×6): qty 1

## 2011-11-01 MED ORDER — RIVAROXABAN 15 MG PO TABS
15.0000 mg | ORAL_TABLET | Freq: Every day | ORAL | Status: DC
  Administered 2011-11-02 – 2011-11-03 (×2): 15 mg via ORAL
  Filled 2011-11-01 (×3): qty 1

## 2011-11-01 MED ORDER — DILTIAZEM HCL ER 240 MG PO CP24
240.0000 mg | ORAL_CAPSULE | Freq: Every day | ORAL | Status: DC
  Administered 2011-11-02 – 2011-11-07 (×7): 240 mg via ORAL
  Filled 2011-11-01 (×7): qty 1

## 2011-11-01 MED ORDER — OXYCODONE-ACETAMINOPHEN 10-325 MG PO TABS: 1.0000 | ORAL_TABLET | Freq: Four times a day (QID) | ORAL | Status: AC | PRN

## 2011-11-01 MED ORDER — NEOSTIGMINE METHYLSULFATE 1 MG/ML IJ SOLN
INTRAMUSCULAR | Status: DC | PRN
  Administered 2011-11-01: 4 mg via INTRAVENOUS

## 2011-11-01 MED ORDER — BUPIVACAINE HCL (PF) 0.25 % IJ SOLN
INTRAMUSCULAR | Status: AC
  Filled 2011-11-01: qty 30

## 2011-11-01 MED ORDER — FENTANYL CITRATE 0.05 MG/ML IJ SOLN
INTRAMUSCULAR | Status: DC | PRN
  Administered 2011-11-01: 250 ug via INTRAVENOUS
  Administered 2011-11-01: 50 ug via INTRAVENOUS
  Administered 2011-11-01 (×2): 25 ug via INTRAVENOUS
  Administered 2011-11-01: 50 ug via INTRAVENOUS

## 2011-11-01 MED ORDER — PROPOFOL 10 MG/ML IV EMUL
INTRAVENOUS | Status: DC | PRN
  Administered 2011-11-01: 30 mg via INTRAVENOUS
  Administered 2011-11-01: 90 mg via INTRAVENOUS
  Administered 2011-11-01: 30 mg via INTRAVENOUS

## 2011-11-01 MED ORDER — ACETAMINOPHEN 650 MG RE SUPP: 650.0000 mg | Freq: Four times a day (QID) | RECTAL | Status: DC | PRN

## 2011-11-01 MED ORDER — ASPIRIN 81 MG PO TABS: 81.0000 mg | ORAL_TABLET | Freq: Every day | ORAL | Status: DC

## 2011-11-01 MED ORDER — VITAMIN C 500 MG PO TABS
500.0000 mg | ORAL_TABLET | Freq: Every day | ORAL | Status: DC
  Administered 2011-11-02 – 2011-11-07 (×7): 500 mg via ORAL
  Filled 2011-11-01 (×7): qty 1

## 2011-11-01 MED ORDER — POTASSIUM CHLORIDE IN NACL 20-0.45 MEQ/L-% IV SOLN
INTRAVENOUS | Status: DC
  Filled 2011-11-01 (×10): qty 1000

## 2011-11-01 MED ORDER — SODIUM CHLORIDE 0.9 % IR SOLN
Status: DC | PRN
  Administered 2011-11-01: 3000 mL

## 2011-11-01 MED ORDER — METHOCARBAMOL 500 MG PO TABS
500.0000 mg | ORAL_TABLET | Freq: Four times a day (QID) | ORAL | Status: DC | PRN
  Administered 2011-11-02 – 2011-11-04 (×6): 500 mg via ORAL
  Filled 2011-11-01 (×7): qty 1

## 2011-11-01 MED ORDER — METOCLOPRAMIDE HCL 5 MG/ML IJ SOLN: 5.0000 mg | Freq: Three times a day (TID) | INTRAMUSCULAR | Status: DC | PRN

## 2011-11-01 MED ORDER — POLYETHYLENE GLYCOL 3350 17 G PO PACK: 17.0000 g | PACK | Freq: Every day | ORAL | Status: DC | PRN

## 2011-11-01 MED ORDER — ATENOLOL 25 MG PO TABS
25.0000 mg | ORAL_TABLET | Freq: Every day | ORAL | Status: DC
  Administered 2011-11-02 – 2011-11-07 (×7): 25 mg via ORAL
  Filled 2011-11-01 (×7): qty 1

## 2011-11-01 MED ORDER — LIDOCAINE HCL (CARDIAC) 20 MG/ML IV SOLN
INTRAVENOUS | Status: DC | PRN
  Administered 2011-11-01: 20 mg via INTRAVENOUS

## 2011-11-01 MED ORDER — PROMETHAZINE HCL 25 MG PO TABS: 25.0000 mg | ORAL_TABLET | Freq: Four times a day (QID) | ORAL | Status: AC | PRN

## 2011-11-01 MED ORDER — RIVAROXABAN 10 MG PO TABS: 10.0000 mg | ORAL_TABLET | Freq: Every day | ORAL | Status: DC

## 2011-11-01 MED ORDER — DOCUSATE SODIUM 100 MG PO CAPS
100.0000 mg | ORAL_CAPSULE | Freq: Two times a day (BID) | ORAL | Status: DC
  Administered 2011-11-01 – 2011-11-06 (×10): 100 mg via ORAL
  Filled 2011-11-01 (×13): qty 1

## 2011-11-01 MED ORDER — OXYCODONE HCL 5 MG PO TABS
5.0000 mg | ORAL_TABLET | ORAL | Status: DC | PRN
  Administered 2011-11-01 – 2011-11-06 (×16): 10 mg via ORAL
  Filled 2011-11-01 (×17): qty 2

## 2011-11-01 MED ORDER — HYDROMORPHONE HCL PF 1 MG/ML IJ SOLN
INTRAMUSCULAR | Status: AC
  Administered 2011-11-01: 0.5 mg via INTRAVENOUS
  Filled 2011-11-01: qty 1

## 2011-11-01 MED ORDER — LACTATED RINGERS IV SOLN
INTRAVENOUS | Status: DC | PRN
  Administered 2011-11-01 (×2): via INTRAVENOUS

## 2011-11-01 MED ORDER — HYDROMORPHONE HCL PF 1 MG/ML IJ SOLN
0.5000 mg | INTRAMUSCULAR | Status: DC | PRN
  Administered 2011-11-01: 0.5 mg via INTRAVENOUS
  Filled 2011-11-01: qty 1

## 2011-11-01 MED ORDER — ACETAMINOPHEN 325 MG PO TABS
650.0000 mg | ORAL_TABLET | Freq: Four times a day (QID) | ORAL | Status: DC | PRN
  Administered 2011-11-04: 650 mg via ORAL
  Filled 2011-11-01: qty 2

## 2011-11-01 MED ORDER — MEPERIDINE HCL 25 MG/ML IJ SOLN: 6.2500 mg | INTRAMUSCULAR | Status: DC | PRN

## 2011-11-01 MED ORDER — SERTRALINE HCL 100 MG PO TABS
100.0000 mg | ORAL_TABLET | Freq: Every day | ORAL | Status: DC
  Administered 2011-11-02 – 2011-11-07 (×7): 100 mg via ORAL
  Filled 2011-11-01 (×7): qty 1

## 2011-11-01 MED ORDER — MIDAZOLAM HCL 2 MG/2ML IJ SOLN
1.0000 mg | INTRAMUSCULAR | Status: DC | PRN
  Administered 2011-11-01: 2 mg via INTRAVENOUS

## 2011-11-01 MED ORDER — ROCURONIUM BROMIDE 100 MG/10ML IV SOLN
INTRAVENOUS | Status: DC | PRN
  Administered 2011-11-01: 10 mg via INTRAVENOUS
  Administered 2011-11-01: 50 mg via INTRAVENOUS

## 2011-11-01 MED ORDER — ONDANSETRON HCL 4 MG/2ML IJ SOLN: 4.0000 mg | Freq: Four times a day (QID) | INTRAMUSCULAR | Status: DC | PRN

## 2011-11-01 MED ORDER — BUPIVACAINE-EPINEPHRINE PF 0.5-1:200000 % IJ SOLN
INTRAMUSCULAR | Status: DC | PRN
  Administered 2011-11-01: 30 mL

## 2011-11-01 MED ORDER — GLYCOPYRROLATE 0.2 MG/ML IJ SOLN
INTRAMUSCULAR | Status: DC | PRN
  Administered 2011-11-01: 0.6 mg via INTRAVENOUS

## 2011-11-01 MED ORDER — DIPHENHYDRAMINE HCL 12.5 MG/5ML PO ELIX: 12.5000 mg | ORAL_SOLUTION | ORAL | Status: DC | PRN

## 2011-11-01 MED ORDER — HYDROMORPHONE HCL PF 1 MG/ML IJ SOLN
0.2500 mg | INTRAMUSCULAR | Status: DC | PRN
  Administered 2011-11-01 (×2): 0.5 mg via INTRAVENOUS

## 2011-11-01 SURGICAL SUPPLY — 61 items
APL SKNCLS STERI-STRIP NONHPOA (GAUZE/BANDAGES/DRESSINGS) ×1
BANDAGE ELASTIC 4 VELCRO ST LF (GAUZE/BANDAGES/DRESSINGS) ×1 IMPLANT
BANDAGE ELASTIC 6 VELCRO ST LF (GAUZE/BANDAGES/DRESSINGS) ×2 IMPLANT
BANDAGE ESMARK 6X9 LF (GAUZE/BANDAGES/DRESSINGS) ×1 IMPLANT
BENZOIN TINCTURE PRP APPL 2/3 (GAUZE/BANDAGES/DRESSINGS) ×2 IMPLANT
BLADE SAG 18X100X1.27 (BLADE) ×2 IMPLANT
BLADE SAW RECIP 87.9 MT (BLADE) ×2 IMPLANT
BLADE SAW SGTL 13X75X1.27 (BLADE) ×2 IMPLANT
BNDG CMPR 9X6 STRL LF SNTH (GAUZE/BANDAGES/DRESSINGS) ×1
BNDG ESMARK 6X9 LF (GAUZE/BANDAGES/DRESSINGS) ×2
BOOTCOVER CLEANROOM LRG (PROTECTIVE WEAR) ×3 IMPLANT
BOWL SMART MIX CTS (DISPOSABLE) ×2 IMPLANT
CEMENT HV SMART SET (Cement) ×4 IMPLANT
CLOTH BEACON ORANGE TIMEOUT ST (SAFETY) ×2 IMPLANT
CLSR STERI-STRIP ANTIMIC 1/2X4 (GAUZE/BANDAGES/DRESSINGS) ×2 IMPLANT
COVER SURGICAL LIGHT HANDLE (MISCELLANEOUS) ×2 IMPLANT
CUFF TOURNIQUET SINGLE 34IN LL (TOURNIQUET CUFF) ×1 IMPLANT
DRAPE EXTREMITY T 121X128X90 (DRAPE) ×2 IMPLANT
DRAPE PROXIMA HALF (DRAPES) ×2 IMPLANT
DRAPE U-SHAPE 47X51 STRL (DRAPES) ×2 IMPLANT
DRSG PAD ABDOMINAL 8X10 ST (GAUZE/BANDAGES/DRESSINGS) ×2 IMPLANT
DURAPREP 26ML APPLICATOR (WOUND CARE) ×2 IMPLANT
ELECT CAUTERY BLADE 6.4 (BLADE) ×2 IMPLANT
ELECT REM PT RETURN 9FT ADLT (ELECTROSURGICAL) ×2
ELECTRODE REM PT RTRN 9FT ADLT (ELECTROSURGICAL) ×1 IMPLANT
EVACUATOR 1/8 PVC DRAIN (DRAIN) IMPLANT
FACESHIELD LNG OPTICON STERILE (SAFETY) ×2 IMPLANT
GLOVE BIOGEL M 7.0 STRL (GLOVE) ×1 IMPLANT
GLOVE BIOGEL PI IND STRL 7.0 (GLOVE) IMPLANT
GLOVE BIOGEL PI IND STRL 8 (GLOVE) ×2 IMPLANT
GLOVE BIOGEL PI INDICATOR 7.0 (GLOVE) ×2
GLOVE BIOGEL PI INDICATOR 8 (GLOVE) ×2
GLOVE ORTHO TXT STRL SZ7.5 (GLOVE) ×2 IMPLANT
GLOVE SURG ORTHO 8.0 STRL STRW (GLOVE) ×2 IMPLANT
HANDPIECE INTERPULSE COAX TIP (DISPOSABLE) ×2
HOOD PEEL AWAY FACE SHEILD DIS (HOOD) ×4 IMPLANT
KIT BASIN OR (CUSTOM PROCEDURE TRAY) ×2 IMPLANT
KIT ROOM TURNOVER OR (KITS) ×2 IMPLANT
MANIFOLD NEPTUNE II (INSTRUMENTS) ×2 IMPLANT
NS IRRIG 1000ML POUR BTL (IV SOLUTION) ×2 IMPLANT
PACK TOTAL JOINT (CUSTOM PROCEDURE TRAY) ×2 IMPLANT
PAD ARMBOARD 7.5X6 YLW CONV (MISCELLANEOUS) ×4 IMPLANT
PAD CAST 4YDX4 CTTN HI CHSV (CAST SUPPLIES) ×1 IMPLANT
PADDING CAST COTTON 4X4 STRL (CAST SUPPLIES) ×2
PADDING CAST COTTON 6X4 STRL (CAST SUPPLIES) ×2 IMPLANT
SET HNDPC FAN SPRY TIP SCT (DISPOSABLE) ×1 IMPLANT
SPONGE GAUZE 4X4 12PLY (GAUZE/BANDAGES/DRESSINGS) ×2 IMPLANT
STAPLER VISISTAT 35W (STAPLE) ×2 IMPLANT
STRIP CLOSURE SKIN 1/2X4 (GAUZE/BANDAGES/DRESSINGS) ×2 IMPLANT
SUCTION FRAZIER TIP 10 FR DISP (SUCTIONS) ×2 IMPLANT
SUT MNCRL AB 4-0 PS2 18 (SUTURE) ×1 IMPLANT
SUT VIC AB 0 CT1 27 (SUTURE) ×4
SUT VIC AB 0 CT1 27XBRD ANBCTR (SUTURE) ×1 IMPLANT
SUT VIC AB 2-0 CT1 27 (SUTURE) ×4
SUT VIC AB 2-0 CT1 TAPERPNT 27 (SUTURE) ×2 IMPLANT
SUT VIC AB 3-0 SH 18 (SUTURE) ×2 IMPLANT
SYR 30ML LL (SYRINGE) ×2 IMPLANT
TOWEL OR 17X24 6PK STRL BLUE (TOWEL DISPOSABLE) ×2 IMPLANT
TOWEL OR 17X26 10 PK STRL BLUE (TOWEL DISPOSABLE) ×2 IMPLANT
TRAY FOLEY CATH 14FR (SET/KITS/TRAYS/PACK) ×1 IMPLANT
WATER STERILE IRR 1000ML POUR (IV SOLUTION) ×4 IMPLANT

## 2011-11-01 NOTE — Progress Notes (Signed)
Orthopedic Tech Progress Note Patient Details:  [name] 28-Mar-1935 [id number]  Ortho Devices Type of Ortho Device: Knee Immobilizer Ortho Device/Splint Interventions: Application   Cammer, Mickie Bail 11/01/2011, 4:30 PM

## 2011-11-01 NOTE — Anesthesia Preprocedure Evaluation (Addendum)
Anesthesia Evaluation  Patient identified by MRN, date of birth, ID band Patient awake    Reviewed: Allergy & Precautions, H&P , NPO status , Patient's Chart, lab work & pertinent test results, reviewed documented beta blocker date and time   History of Anesthesia Complications Negative for: history of anesthetic complications  Airway Mallampati: II TM Distance: >3 FB Neck ROM: Full    Dental  (+) Missing, Poor Dentition, Dental Advisory Given and Edentulous Upper   Pulmonary neg pulmonary ROS, neg pneumonia -,  breath sounds clear to auscultation  Pulmonary exam normal       Cardiovascular hypertension, Pt. on medications and Pt. on home beta blockers + CAD and + Cardiac Stents Rhythm:Regular Rate:Normal  Stress test '12:  No ischemia, normal LVF, EF 64%   Neuro/Psych PSYCHIATRIC DISORDERS Depression negative neurological ROS     GI/Hepatic Neg liver ROS, GERD-  Medicated and Controlled,  Endo/Other  negative endocrine ROSMorbid obesity  Renal/GU negative Renal ROS     Musculoskeletal   Abdominal (+) + obese,   Peds  Hematology   Anesthesia Other Findings   Reproductive/Obstetrics                          Anesthesia Physical Anesthesia Plan  ASA: III  Anesthesia Plan: General   Post-op Pain Management:    Induction: Intravenous  Airway Management Planned: Oral ETT  Additional Equipment:   Intra-op Plan:   Post-operative Plan: Extubation in OR  Informed Consent: I have reviewed the patients History and Physical, chart, labs and discussed the procedure including the risks, benefits and alternatives for the proposed anesthesia with the patient or authorized representative who has indicated his/her understanding and acceptance.   Dental advisory given  Plan Discussed with: CRNA, Surgeon and Anesthesiologist  Anesthesia Plan Comments: (Plan routine monitors, GETA with femoral nerve  block for post op analgesia)       Anesthesia Quick Evaluation

## 2011-11-01 NOTE — Anesthesia Procedure Notes (Addendum)
Anesthesia Regional Block:  Femoral nerve block  Pre-Anesthetic Checklist: ,, timeout performed, Correct Patient, Correct Site, Correct Laterality, Correct Procedure, Correct Position, site marked, Risks and benefits discussed,  Surgical consent,  Pre-op evaluation,  At surgeon's request and post-op pain management  Laterality: Right  Prep: chloraprep       Needles:  Injection technique: Single-shot  Needle Type: Stimulator Needle - 40     Needle Length: 4cm  Needle Gauge: 22 and 22 G    Additional Needles: Femoral nerve block Narrative:  Start time: 11/01/2011 10:58 AM End time: 11/01/2011 11:05 AM Injection made incrementally with aspirations every 5 mL.  Performed by: Personally  Anesthesiologist: Sandford Craze, MD  Additional Notes: Pt identified in Holding room.  Monitors applied. Working IV access confirmed. Sterile prep R groin.  #22ga PNS to patella twitch at 0.28mA threshold.  30cc 0.5% Bupivacaine with 1:200k epi injected incrementally after negative test dose.  Patient asymptomatic, VSS, no heme aspirated, tolerated well.   Sandford Craze, MD   Procedure Name: Intubation Date/Time: 11/01/2011 12:47 PM Performed by: Rogelia Boga Pre-anesthesia Checklist: Patient identified, Emergency Drugs available, Suction available, Patient being monitored and Timeout performed Patient Re-evaluated:Patient Re-evaluated prior to inductionOxygen Delivery Method: Circle system utilized Preoxygenation: Pre-oxygenation with 100% oxygen Intubation Type: IV induction Ventilation: Mask ventilation without difficulty and Oral airway inserted - appropriate to patient size Laryngoscope Size: Mac and 4 Grade View: Grade I Tube type: Oral Tube size: 7.5 mm Number of attempts: 1 Airway Equipment and Method: Stylet Placement Confirmation: ETT inserted through vocal cords under direct vision,  positive ETCO2 and breath sounds checked- equal and bilateral Secured at: 21 cm Tube secured with:  Tape Dental Injury: Teeth and Oropharynx as per pre-operative assessment

## 2011-11-01 NOTE — Anesthesia Postprocedure Evaluation (Signed)
  Anesthesia Post-op Note  Patient: [name]  Procedure(s) Performed: Procedure(s) (LRB): TOTAL KNEE ARTHROPLASTY (Right)  Patient Location: PACU  Anesthesia Type: GA combined with regional for post-op pain  Level of Consciousness: awake, alert  and oriented  Airway and Oxygen Therapy: Patient Spontanous Breathing and Patient connected to nasal cannula oxygen  Post-op Pain: mild  Post-op Assessment: Post-op Vital signs reviewed, Patient's Cardiovascular Status Stable, Respiratory Function Stable, Patent Airway, No signs of Nausea or vomiting and Pain level controlled  Post-op Vital Signs: Reviewed and stable  Complications: No apparent anesthesia complications

## 2011-11-01 NOTE — Preoperative (Signed)
Beta Blockers   Reason not to administer Beta Blockers:Not Applicable 
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
## 2011-11-01 NOTE — Op Note (Signed)
DATE OF SURGERY:  11/01/2011 TIME: 2:39 PM  PATIENT NAME:  [name]   AGE: 76 y.o.    PRE-OPERATIVE DIAGNOSIS:  right valgus knee DJD  POST-OPERATIVE DIAGNOSIS:  Same  PROCEDURE:  Procedure(s): TOTAL KNEE ARTHROPLASTY   SURGEON:  Eulas Post, MD   ASSISTANT:  Janace Litten, OPA-C, present and scrubbed throughout the case, critical for assistance with exposure, retraction, instrumentation, and closure.   OPERATIVE IMPLANTS: Depuy PFC Sigma, Posterior Stabilized.  Femur size 4N, Tibia size 4, Patella size 38 3-peg oval button, with a 10 mm polyethylene insert.   PREOPERATIVE INDICATIONS:  [name] is a 76 y.o. year old female with end stage bone on bone valgus degenerative arthritis of the knee who failed conservative treatment, including injections, antiinflammatories, activity modification, and assistive devices, and had significant impairment of their activities of daily living, and elected for Total Knee Arthroplasty.   The risks, benefits, and alternatives were discussed at length including but not limited to the risks of infection, bleeding, nerve injury, stiffness, blood clots, the need for revision surgery, cardiopulmonary complications, among others, and they were willing to proceed.   OPERATIVE DESCRIPTION:  The patient was brought to the operative room and placed in a supine position.  General anesthesia was administered.  IV antibiotics were given.  The lower extremity was prepped and draped in the usual sterile fashion.  Time out was performed.  The leg was elevated and exsanguinated and the tourniquet was inflated.  Anterior quadriceps tendon splitting approach was performed.  The patella was everted and osteophytes were removed.  The anterior horn of the medial and lateral meniscus was removed.   The distal femur was opened with the drill and the intramedullary distal femoral cutting jig was utilized, set at 5 degrees resecting 11 mm off the distal  femur.  Even with this degree of resection, I barely scraped the distal anterior femur. The femur was fairly hypoplastic anteriorly, although posteriorly had a more normal profile, consistent with her previous total knee replacement on the contralateral side. Care was taken to protect the collateral ligaments.  Then the extramedullary tibial cutting jig was utilized making the appropriate cut using the anterior tibial crest as a reference building in appropriate posterior slope.  Care was taken during the cut to protect the medial and collateral ligaments.  The proximal tibia was removed along with the posterior horns of the menisci.  The PCL was sacrificed.    The extensor gap was measured and was approximately 10mm.    The distal femoral sizing jig was applied, taking care to avoid notching.  Then the 4-in-1 cutting jig was applied and the anterior and posterior femur was cut, along with the chamfer cuts.  All posterior osteophytes were removed.  The flexion gap was then measured and was symmetric with the extension gap. I did not have to adjust the 4-in-1 cutting guide, as the posterior portion of the femoral condyle was worn, but normal and not hypoplastic.  I completed the distal femoral preparation using the appropriate jig to prepare the box.  The patella was then measured, and cut with the saw.    The proximal tibia sized and prepared accordingly with the reamer and the punch, and then all components were trialed with the 10mm poly insert.  The knee was found to have excellent balance and full motion.    The above named components were then cemented into place and all excess cement was removed.  The trial polyethylene component  was in place during cementation, and then was exchanged for the real polyethylene component.    The knee was easily taken through a range of motion and the patella tracked well and the knee irrigated copiously and the parapatellar and subcutaneous tissue closed with  vicryl, and monocryl with steri strips for the skin.  The wounds were injected with marcaine, and dressed with sterile gauze and the tourniquet released and the patient was awakened and returned to the PACU in stable and satisfactory condition.  There were no complications.  Total tourniquet time was 90 minutes.

## 2011-11-01 NOTE — Transfer of Care (Signed)
Immediate Anesthesia Transfer of Care Note  Patient: [name]  Procedure(s) Performed: Procedure(s) (LRB): TOTAL KNEE ARTHROPLASTY (Right)  Patient Location: PACU  Anesthesia Type: General  Level of Consciousness: awake, alert  and oriented  Airway & Oxygen Therapy: Patient Spontanous Breathing and Patient connected to nasal cannula oxygen  Post-op Assessment: Report given to PACU RN and Post -op Vital signs reviewed and stable  Post vital signs: Reviewed and stable  Complications: No apparent anesthesia complications

## 2011-11-01 NOTE — Plan of Care (Signed)
Problem: Consults Goal: Diagnosis- Total Joint Replacement Primary Total Knee Right     

## 2011-11-02 ENCOUNTER — Encounter (HOSPITAL_COMMUNITY): Payer: Self-pay | Admitting: Orthopedic Surgery

## 2011-11-02 LAB — BASIC METABOLIC PANEL
BUN: 3 mg/dL — ABNORMAL LOW (ref 6–23)
CO2: 30 mEq/L (ref 19–32)
Calcium: 8.8 mg/dL (ref 8.4–10.5)
Creatinine, Ser: 0.43 mg/dL — ABNORMAL LOW (ref 0.50–1.10)
GFR calc non Af Amer: >90 mL/min (ref >90)
Glucose, Bld: 135 mg/dL — ABNORMAL HIGH (ref 70–99)
Sodium: 134 mEq/L — ABNORMAL LOW (ref 135–145)

## 2011-11-02 LAB — CBC
Hemoglobin: 10.9 g/dL — ABNORMAL LOW (ref 12.0–15.0)
MCH: 26.5 pg (ref 26.0–34.0)
MCHC: 32.4 g/dL (ref 30.0–36.0)
MCV: 81.8 fL (ref 78.0–100.0)
RBC: 4.11 MIL/uL (ref 3.87–5.11)

## 2011-11-02 NOTE — Care Management Note (Addendum)
    Page 1 of 2   11/04/2011     4:17:13 PM   CARE MANAGEMENT NOTE 11/04/2011  Patient:  [name], [name]   Account Number:  [id number]  Date Initiated:  11/02/2011  Documentation initiated by:  Dhilan Brauer  Subjective/Objective Assessment:   POD#1 S/P RIGHT tka  plan to d/c home with family  d/c on Xarelto  has RW and BSC  CPM not ordered.     Action/Plan:   Home with HH services   Anticipated DC Date:  11/07/2011   Anticipated DC Plan:  SKILLED NURSING FACILITY  In-house referral  Clinical Social Worker      DC Planning Services  CM consult      Choice offered to / List presented to:             Status of service:  In process, will continue to follow Medicare Important Message given?   (If response is "NO", the following Medicare IM given date fields will be blank) Date Medicare IM given:   Date Additional Medicare IM given:    Discharge Disposition:  SKILLED NURSING FACILITY  Per UR Regulation:  Reviewed for med. necessity/level of care/duration of stay  If discussed at Long Length of Stay Meetings, dates discussed:    Comments:  11/04/11  16:14  Anette Guarneri RN/CM Patient not progressing with PT, physical therapy now recommending SNF, patient and husband agreeable CSW referral made and patient has been evaluated by CSW, PASSAR submitted and Fl2 faxed.   11/02/11 11:31  Anette Guarneri RN/CM Spoke with patient, patient plans to d/c home with husband/daughter and grand-daughter assisting Per Patient she has RW and BSC, CPM has not been ordered for patient while in hospital MD office pre-arranged for [facility] services with Genevieve Norlander prior to admission Patient to d/c on Xarelto 15mg  x12 days, uses [facility] [facility]/[phone]. Per pharmacist they do have Xarelto 15mg  available. Xarelto has been approved, no prior auth required, copay is $41.00.

## 2011-11-02 NOTE — Progress Notes (Signed)
Physical Therapy Treatment Patient Details Name: [name] MRN: [id number] DOB: 02/23/1936 Today's Date: 11/02/2011 Time: 4098-1191 PT Time Calculation (min): 18 min  PT Assessment / Plan / Recommendation Comments on Treatment Session  Pt limited by pain.     Follow Up Recommendations  Home health PT;Supervision - Intermittent    Barriers to Discharge        Equipment Recommendations  None recommended by PT    Recommendations for Other Services    Frequency 7X/week   Plan Discharge plan remains appropriate;Frequency remains appropriate    Precautions / Restrictions Precautions Precautions: Fall Required Braces or Orthoses: Knee Immobilizer - Right Knee Immobilizer - Right: On except when in CPM Restrictions Weight Bearing Restrictions: Yes RLE Weight Bearing: Weight bearing as tolerated   Pertinent Vitals/Pain Pt reporting pain 6/10. RN notified.    Mobility  Bed Mobility Bed Mobility: Not assessed Transfers Transfers: Not assessed Ambulation/Gait Ambulation/Gait Assistance: Not tested (comment) Wheelchair Mobility Wheelchair Mobility: No    Exercises Total Joint Exercises Ankle Circles/Pumps: AROM;10 reps;Seated Quad Sets: AROM;10 reps Short Arc Quad: 5 reps;Right Heel Slides: 10 reps;Right Straight Leg Raises: 5 reps;AROM;Supine   PT Diagnosis:    PT Problem List:   PT Treatment Interventions:     PT Goals Acute Rehab PT Goals PT Goal Formulation: With patient Time For Goal Achievement: 11/09/11 Potential to Achieve Goals: Good Pt will go Supine/Side to Sit: with modified independence;with HOB 0 degrees Pt will Perform Home Exercise Program: Independently PT Goal: Perform Home Exercise Program - Progress: Progressing toward goal  Visit Information  Last PT Received On: 11/02/11 Assistance Needed: +1    Subjective Data  Subjective: I am having a lot of pain Patient Stated Goal: Walk without pain   Cognition  Overall Cognitive Status:  Appears within functional limits for tasks assessed/performed Arousal/Alertness: Awake/alert Orientation Level: Oriented X4 / Intact Behavior During Session: [facility] for tasks performed    Balance  Balance Balance Assessed: No  End of Session PT - End of Session Equipment Utilized During Treatment: Gait belt Activity Tolerance: Patient tolerated treatment well Patient left: in chair;with call bell/phone within reach Nurse Communication: Mobility status   GP     [name] 11/02/2011, 5:56 PM [name] DPT [phone]

## 2011-11-02 NOTE — Progress Notes (Signed)
Utilization review completed. Annemarie Sebree, RN, BSN. 

## 2011-11-02 NOTE — Evaluation (Signed)
Physical Therapy Evaluation Patient Details Name: [name] MRN: [id number] DOB: 1936/03/12 Today's Date: 11/02/2011 Time: 7829-5621 PT Time Calculation (min): 28 min  PT Assessment / Plan / Recommendation Clinical Impression  Pt is a 76 y/o female s/p R TKA. Pt doing well with mobiliity and should be appropriate to return to home with HHPT. Acute PT will continue to follow pt to progress independence with mobility.     PT Assessment  Patient needs continued PT services    Follow Up Recommendations  Home health PT;Supervision - Intermittent    Barriers to Discharge None      Equipment Recommendations  None recommended by PT    Recommendations for Other Services     Frequency 7X/week    Precautions / Restrictions Precautions Precautions: Fall Required Braces or Orthoses: Knee Immobilizer - Right Knee Immobilizer - Right: On except when in CPM Restrictions Weight Bearing Restrictions: Yes RLE Weight Bearing: Weight bearing as tolerated   Pertinent Vitals/Pain Pt reports pain 4/10 premedicated.       Mobility  Bed Mobility Bed Mobility: Supine to Sit;Sitting - Scoot to Edge of Bed Supine to Sit: 4: Min assist Supine to Sit: Patient Percentage: 90% Sitting - Scoot to Edge of Bed: 4: Min assist Sitting - Scoot to [place] of Bed: Patient Percentage: 90% Transfers Transfers: Sit to Stand;Stand to [employer] Transfers Sit to Stand: 4: Min assist;From bed;From chair/3-in-1;With upper extremity assist Stand to Sit: 4: Min assist;To chair/3-in-1;With upper extremity assist (1 trial to 3 in 1; 1 trial to recliner) Stand Pivot Transfers: 4: Min assist Details for Transfer Assistance: Cues for hand placement and assist to position RLE and initiate standing. Also assist to conrtol descent to chair.   Ambulation/Gait Ambulation/Gait Assistance: 4: Min guard Ambulation Distance (Feet): 30 Feet Assistive device: Rolling walker Ambulation/Gait Assistance Details: Verbal cues  for gait sequencing and WBAT.   Gait Pattern: Step-to pattern;Decreased stance time - right;Decreased step length - right Stairs: No Wheelchair Mobility Wheelchair Mobility: No Modified Rankin (Stroke Patients Only) Modified Rankin: No symptoms    Exercises Total Joint Exercises Ankle Circles/Pumps: AROM;10 reps;Seated Goniometric ROM: 0-60 degrees AAROM R knee.    PT Diagnosis: Difficulty walking;Acute pain;Generalized weakness  PT Problem List: Decreased strength;Decreased range of motion;Decreased mobility;Decreased knowledge of use of DME;Decreased safety awareness;Pain PT Treatment Interventions: DME instruction;Gait training;Stair training;Functional mobility training;Therapeutic activities;Therapeutic exercise;Patient/family education;Manual techniques   PT Goals Acute Rehab PT Goals PT Goal Formulation: With patient Time For Goal Achievement: 11/09/11 Potential to Achieve Goals: Good Pt will go Supine/Side to Sit: with modified independence;with HOB 0 degrees PT Goal: Supine/Side to Sit - Progress: Goal set today Pt will go Sit to Supine/Side: with modified independence PT Goal: Sit to Supine/Side - Progress: Goal set today Pt will go Sit to Stand: with modified independence PT Goal: Sit to Stand - Progress: Goal set today Pt will go Stand to Sit: with modified independence PT Goal: Stand to Sit - Progress: Goal set today Pt will Transfer Bed to Chair/Chair to Bed: with modified independence PT Transfer Goal: Bed to Chair/Chair to Bed - Progress: Goal set today Pt will Ambulate: >150 feet;with modified independence;with rolling walker PT Goal: Ambulate - Progress: Goal set today Pt will Go Up / Down Stairs: 3-5 stairs;with supervision;with least restrictive assistive device PT Goal: Up/Down Stairs - Progress: Goal set today Pt will Perform Home Exercise Program: Independently PT Goal: Perform Home Exercise Program - Progress: Goal set today  Visit Information  Last PT  Received On: 11/02/11 Assistance Needed: +1    Subjective Data  Subjective: I dont want to be like this always   Prior Functioning  Home Living Lives With: Spouse Type of Home: House Home Access: Stairs to enter [employer] of Steps: 5 Entrance Stairs-Rails: Can reach both Home Layout: One level Bathroom Shower/Tub: Walk-in shower;Door [employer] Toilet: Standard Bathroom Accessibility: Yes How Accessible: Accessible via walker Home Adaptive Equipment: Bedside commode/3-in-1;Shower chair with back;Straight cane;Quad cane;Walker - rolling Prior Function Level of Independence: Independent with assistive device(s) Able to Take Stairs?: Yes Communication Communication: No difficulties Dominant Hand: Right    Cognition  Overall Cognitive Status: Appears within functional limits for tasks assessed/performed Arousal/Alertness: Awake/alert Orientation Level: Oriented X4 / Intact Behavior During Session: [facility] for tasks performed    Extremity/Trunk Assessment Right Lower Extremity Assessment RLE ROM/Strength/Tone: Deficits;Due to pain RLE ROM/Strength/Tone Deficits: Strenth and ROM limited secondary to surgery.  Left Lower Extremity Assessment LLE ROM/Strength/Tone: Within functional levels Trunk Assessment Trunk Assessment: Normal   Balance Balance Balance Assessed: Yes Static Sitting Balance Static Sitting - Balance Support: Feet supported Static Sitting - Level of Assistance: 7: Independent Static Sitting - Comment/# of Minutes: several minutes sitting on EOB and on 3 in 1 with  no LOB or dizziness.   End of Session PT - End of Session Equipment Utilized During Treatment: Gait belt Activity Tolerance: Patient tolerated treatment well Patient left: in chair;with call bell/phone within reach Nurse Communication: Mobility status  GP     [name] 11/02/2011, 1:09 PM [name] DPT [phone]

## 2011-11-02 NOTE — Progress Notes (Signed)
Occupational Therapy Evaluation Patient Details Name: [name] MRN: [id number] DOB: [birth year] Today's Date: 11/02/2011 Time: 7829-5621 OT Time Calculation (min): 21 min  OT Assessment / Plan / Recommendation Clinical Impression  76 yo s/p R TKA. Pt will benefit from skilled OT services to max independence with ADL and functional mobility for ADL, due to below deficit, to facilitate D/C home with husband with [facility] services. Do not feel pt is safe to D/C tomorrow due to balance and mobility deficits this during this pm session.    OT Assessment  Patient needs continued OT Services    Follow Up Recommendations  Home health OT    Barriers to Discharge None    Equipment Recommendations  Tub/shower bench    Recommendations for Other Services    Frequency  Min 3X/week    Precautions / Restrictions Precautions Precautions: Fall Required Braces or Orthoses: Knee Immobilizer - Right Knee Immobilizer - Right: On except when in CPM Restrictions Weight Bearing Restrictions: Yes RLE Weight Bearing: Weight bearing as tolerated   Pertinent Vitals/Pain 7. nsg notified    ADL  Grooming: Performed;Supervision/safety Where Assessed - Grooming: Supported standing Upper Body Bathing: Simulated;Set up Where Assessed - Upper Body Bathing: Unsupported sitting Lower Body Bathing: Simulated;Minimal assistance Where Assessed - Lower Body Bathing: Supported sit to stand Upper Body Dressing: Simulated;Set up Where Assessed - Upper Body Dressing: Unsupported sitting Lower Body Dressing: Simulated;Minimal assistance Where Assessed - Lower Body Dressing: Supported sit to stand Toilet Transfer: Simulated;Minimal assistance Toilet Transfer Method: Sit to Barista: Other (comment) (bed - chair) Toileting - Clothing Manipulation and Hygiene: Simulated;Supervision/safety Where Assessed - Toileting Clothing Manipulation and Hygiene: Standing Transfers/Ambulation Related to  ADLs: Mod A sit - stand from bed. vc for sequence and hand placement ADL Comments: may benefit from use of reacher. pt has 4 at home    OT Diagnosis: Generalized weakness;Acute pain  OT Problem List: Decreased strength;Decreased range of motion;Decreased activity tolerance;Impaired balance (sitting and/or standing);Decreased safety awareness;Decreased knowledge of use of DME or AE;Pain OT Treatment Interventions: Self-care/ADL training;Energy conservation;DME and/or AE instruction;Therapeutic activities;Patient/family education;Balance training   OT Goals Acute Rehab OT Goals OT Goal Formulation:  (eval only)  Visit Information  Last OT Received On: 11/02/11 Assistance Needed: +1    Subjective Data      Prior Functioning  Vision/Perception  Home Living Lives With: Spouse Available Help at Discharge: Available 24 hours/day Type of Home: House Home Access: Stairs to enter [employer] of Steps: 5 Entrance Stairs-Rails: Can reach both Home Layout: One level Bathroom Shower/Tub: Walk-in shower;Door [employer] Toilet: Standard Bathroom Accessibility: Yes How Accessible: Accessible via walker Home Adaptive Equipment: Bedside commode/3-in-1;Shower chair with back;Straight cane;Quad cane;Walker - rolling Additional Comments: has ramp Prior Function Level of Independence: Independent with assistive device(s) Able to Take Stairs?: Yes Driving: No Communication Communication: No difficulties Dominant Hand: Right      Cognition  Overall Cognitive Status:  (repetitive cues for walkingsequence. Affect of meds? [place]) Arousal/Alertness: Awake/alert Orientation Level: Oriented X4 / Intact Behavior During Session: WFL for tasks performed    Extremity/Trunk Assessment Right Upper Extremity Assessment RUE ROM/Strength/Tone: [facility] for tasks assessed Left Upper Extremity Assessment LUE ROM/Strength/Tone: WFL for tasks assessed Trunk Assessment Trunk Assessment: Normal     Mobility Bed Mobility Bed Mobility: Supine to Sit Supine to Sit: 4: Min assist;HOB elevated;With rails Supine to Sit: Patient Percentage: 80% Sitting - Scoot to Edge of Bed: 4: Min assist Sitting - Scoot to [place] of Bed: Patient  Percentage: 90% Details for Bed Mobility Assistance: A to move R LE Transfers Transfers: Sit to Stand;Stand to Sit Sit to Stand: 3: Mod assist;With upper extremity assist;From bed Stand to Sit: 3: Mod assist;With upper extremity assist;To bed Details for Transfer Assistance: Cues for hand placement and assist to position RLE and initiate standing. Also assist to conrtol descent to chair.   (loss of balance during ambulation. Max A to recover)   Exercise Total Joint Exercises Ankle Circles/Pumps: AROM;10 reps;Seated Quad Sets: AROM;10 reps Short Arc Quad: 5 reps;Right Heel Slides: 10 reps;Right Straight Leg Raises: 5 reps;AROM;Supine  Balance Balance Balance Assessed: No  End of Session OT - End of Session Equipment Utilized During Treatment: Gait belt Activity Tolerance: Patient limited by pain;Patient limited by fatigue Patient left: in bed;with call bell/phone within reach;with family/visitor present Nurse Communication: Mobility status  GO     Haden Suder,HILLARY 11/02/2011, 6:40 PM [facility], OTR/L  [phone] 11/02/2011

## 2011-11-02 NOTE — Progress Notes (Signed)
Patient ID: [name], female   DOB: 08-17-1935, 76 y.o.   MRN: [id number]     Subjective:  Patient reports pain as mild to moderate.  She states that she been hurting more this time than when she had her last knee surgery.  Objective:   VITALS:   Filed Vitals:   11/01/11 1759 11/01/11 2041 11/02/11 0215 11/02/11 0520  BP: 163/70 138/56 155/65 129/63  Pulse: 62 62 64 59  Temp: 98 F (36.7 C) 98.2 F (36.8 C) 99 F (37.2 C) 98.9 F (37.2 C)  TempSrc:  Oral Oral Oral  Resp: 18 16 16 18   SpO2: 92% 95% 97% 95%    ABD soft Sensation intact distally Dorsiflexion/Plantar flexion intact Incision: dressing C/D/I and no drainage  LABS  Results for orders placed during the hospital encounter of 11/01/11 (from the past 24 hour(s))  CBC     Status: Abnormal   Collection Time   11/02/11  6:00 AM      Component Value Range   WBC 10.8 (*) 4.0 - 10.5 K/uL   RBC 4.11  3.87 - 5.11 MIL/uL   Hemoglobin 10.9 (*) 12.0 - 15.0 g/dL   HCT 86.5 (*) 78.4 - 69.6 %   MCV 81.8  78.0 - 100.0 fL   MCH 26.5  26.0 - 34.0 pg   MCHC 32.4  30.0 - 36.0 g/dL   RDW 29.5  28.4 - 13.2 %   Platelets 259  150 - 400 K/uL  BASIC METABOLIC PANEL     Status: Abnormal   Collection Time   11/02/11  6:00 AM      Component Value Range   Sodium 134 (*) 135 - 145 mEq/L   Potassium 3.5  3.5 - 5.1 mEq/L   Chloride 97  96 - 112 mEq/L   CO2 30  19 - 32 mEq/L   Glucose, Bld 135 (*) 70 - 99 mg/dL   BUN 3 (*) 6 - 23 mg/dL   Creatinine, Ser 4.40 (*) 0.50 - 1.10 mg/dL   Calcium 8.8  8.4 - 10.2 mg/dL   GFR calc non Af Amer >90  >90 mL/min   GFR calc Af Amer >90  >90 mL/min    X-ray Knee Right Port  11/01/2011  *RADIOLOGY REPORT*  Clinical Data: 76 year old female status post right knee surgery.  PORTABLE RIGHT KNEE - 1-2 VIEW  Comparison: None.  Findings: Portable AP and cross-table lateral views.  Sequelae of total knee arthroplasty.  Hardware components appear intact and normally aligned.  Postoperative changes  to the surrounding soft tissues including subcutaneous gas and suprapatellar joint effusion.  Dystrophic calcification in the lateral right five.  No unexpected osseous changes.  IMPRESSION: Postoperative changes from right total knee arthroplasty.   Original Report Authenticated By: Harley Hallmark, M.D.     Assessment/Plan: 1 Day Post-Op   Principal Problem:  *Osteoarthritis of right knee   Advance diet Up with therapy May DC knee immobilizer  SNF vs. Home fri. Expected ABLA, mild, observe, currently asymptomatic.  Recheck h/h tomorrow.    Haskel Khan 11/02/2011, 7:23 AM   Teryl Lucy, MD Cell [phone] Pager [phone]

## 2011-11-03 ENCOUNTER — Encounter (HOSPITAL_COMMUNITY): Payer: Self-pay | Admitting: Orthopedic Surgery

## 2011-11-03 DIAGNOSIS — R0600 Dyspnea, unspecified: Secondary | ICD-10-CM

## 2011-11-03 DIAGNOSIS — R0609 Other forms of dyspnea: Secondary | ICD-10-CM

## 2011-11-03 HISTORY — DX: Other forms of dyspnea: R06.09

## 2011-11-03 HISTORY — DX: Dyspnea, unspecified: R06.00

## 2011-11-03 LAB — CBC
HCT: 31.1 % — ABNORMAL LOW (ref 36.0–46.0)
Hemoglobin: 10.1 g/dL — ABNORMAL LOW (ref 12.0–15.0)
MCHC: 32.5 g/dL (ref 30.0–36.0)
RDW: 14.5 % (ref 11.5–15.5)
WBC: 13.1 10*3/uL — ABNORMAL HIGH (ref 4.0–10.5)

## 2011-11-03 MED ORDER — RIVAROXABAN 10 MG PO TABS
10.0000 mg | ORAL_TABLET | Freq: Every day | ORAL | Status: DC
  Administered 2011-11-04 – 2011-11-07 (×4): 10 mg via ORAL
  Filled 2011-11-03 (×5): qty 1

## 2011-11-03 NOTE — Progress Notes (Signed)
Occupational Therapy Treatment Patient Details Name: [name] MRN: [id number] DOB: Mar 04, 1936 Today's Date: 11/03/2011 Time: 4098-1191 OT Time Calculation (min): 24 min  OT Assessment / Plan / Recommendation Comments on Treatment Session Pt continues to required Min to Mod A with mobility and ADLs. Pt currently not safe to return home alone- pt reports she has family available to provide assistance as needed. Will need to confirm this.    Follow Up Recommendations  Home health OT;Supervision/Assistance - 24 hour    Barriers to Discharge       Equipment Recommendations  Tub/shower bench    Recommendations for Other Services    Frequency     Plan Discharge plan remains appropriate    Precautions / Restrictions Precautions Precautions: Fall Required Braces or Orthoses: Knee Immobilizer - Right Knee Immobilizer - Right: On except when in CPM (per pt, PT removed for ambulation in session this am) Restrictions Weight Bearing Restrictions: Yes RLE Weight Bearing: Weight bearing as tolerated   Pertinent Vitals/Pain Pt reports no knee pain at beginning of session, but incr knee pain with ambulation. Pt did not rate. Repositioned knee for pain relief at end of session    ADL  Grooming: Performed;Minimal assistance;Teeth care Where Assessed - Grooming: Supported standing (pt leaning on sink with bil forearms for support) Toilet Transfer: Simulated;Moderate assistance Toilet Transfer Method: Sit to Barista:  (from chair with armrests) Toileting - Clothing Manipulation and Hygiene: Simulated;Minimal assistance Where Assessed - Engineer, mining and Hygiene: Standing Equipment Used: Gait belt;Rolling walker Transfers/Ambulation Related to ADLs: VC for hand placement and RLE managment for sit <>stand. Mod A to achieve upright standing and to maintain balance during transition of hand from chair armrests to RW ADL Comments: Pt verbalized  understand of use of reacher for LB dsg. Pt declined demonstration and/or practice, stating she was "so sleepy"    OT Diagnosis:    OT Problem List:   OT Treatment Interventions:     OT Goals    Visit Information  Last OT Received On: 11/03/11 Assistance Needed: +1    Subjective Data      Prior Functioning       Cognition  Overall Cognitive Status: Appears within functional limits for tasks assessed/performed Arousal/Alertness: Awake/alert Orientation Level: Oriented X4 / Intact Behavior During Session: [facility] for tasks performed    Mobility Bed Mobility Bed Mobility: Not assessed Transfers Sit to Stand: 3: Mod assist;With upper extremity assist;From chair/3-in-1;With armrests Stand to Sit: 4: Min assist;To chair/3-in-1;With armrests Details for Transfer Assistance: VC for hand placement and RLE management. Pt required assist to initiate standing and for steadying during transition of hands from chair armrests to RW   Exercises Total Joint Exercises Quad Sets: AROM;10 reps Heel Slides: 10 reps;Right Goniometric ROM: 0-50 AAROM  Balance Balance Balance Assessed: Yes Static Sitting Balance Static Sitting - Balance Support: Feet supported Static Sitting - Level of Assistance: 7: Independent Static Standing Balance Static Standing - Level of Assistance: 4: Min assist Static Standing - Comment/# of Minutes: Pt required support (on forearms) at sink to perform teeth care. Pt unable to maintain weight with UE's not supported. Also, during standing, pt with Rt knee buckling and required therapist assist to regain balance. Stood at sink ~102min for oral care  End of Session OT - End of Session Equipment Utilized During Treatment: Gait belt Activity Tolerance: Patient limited by pain;Patient limited by fatigue Patient left: with call bell/phone within reach;with family/visitor present;in chair Nurse Communication: Mobility  status  GO     Myriam Brandhorst 11/03/2011, 12:11 PM

## 2011-11-03 NOTE — Progress Notes (Signed)
Physical Therapy Treatment Patient Details Name: [name] MRN: [id number] DOB: 08-10-1935 Today's Date: 11/03/2011 Time: 8657-8469 PT Time Calculation (min): 33 min  PT Assessment / Plan / Recommendation Comments on Treatment Session  Pt able to perform SLR x 5 independently.  Pt's ROM limited by bulky dressing with dried blood. Sit to stand transfers continue to require assistance. No caregiver present for any session to date.      Follow Up Recommendations  Home health PT;Supervision - Intermittent    Barriers to Discharge        Equipment Recommendations  Tub/shower bench    Recommendations for Other Services    Frequency 7X/week   Plan Discharge plan remains appropriate;Frequency remains appropriate    Precautions / Restrictions Precautions Precautions: Fall Required Braces or Orthoses: Knee Immobilizer - Right Knee Immobilizer - Right: On except when in CPM Restrictions Weight Bearing Restrictions: Yes RLE Weight Bearing: Weight bearing as tolerated   Pertinent Vitals/Pain Pt reporting no pain at start of session, some pain with Knee flexion and WB on R knee. RN provided pt with pain meds at completion of session.      Mobility  Bed Mobility Bed Mobility: Sit to Supine Supine to Sit: Not tested (comment) Supine to Sit: Patient Percentage: 80% Sit to Supine: 3: Mod assist;HOB flat Details for Bed Mobility Assistance: Assist for bilateral LEs  Transfers Transfers: Sit to Stand;Stand to Sit;Stand Pivot Transfers Sit to Stand: 3: Mod assist;With upper extremity assist;From chair/3-in-1;With armrests Stand to Sit: 4: Min assist;To chair/3-in-1;With armrests Stand Pivot Transfers: 4: Min assist Details for Transfer Assistance: Continued difficulty initiating standing due to generalized weakness.  Pr performed sit to stand transfer several times with need for repeated verbal cueing for technique.    Ambulation/Gait Ambulation/Gait Assistance: 4: Min guard;3: Mod  assist Ambulation Distance (Feet): 50 Feet Assistive device: Rolling walker Ambulation/Gait Assistance Details: Initially pt required mod assist to stabilize pt and support pt's body wt during L swing phase.  After first few steps pt able to ambulate with min guard assist.  Each time pt stopped in standing and had to start again pt required assist to advance L LE for first 2 steps.  Gait Pattern: Step-to pattern;Step-through pattern;Decreased stance time - right;Decreased weight shift to right General Gait Details: no KI when ambulating Stairs: No    Exercises Total Joint Exercises Straight Leg Raises: 5 reps;Right;AROM;Strengthening   PT Diagnosis:    PT Problem List:   PT Treatment Interventions:     PT Goals Acute Rehab PT Goals PT Goal Formulation: With patient Time For Goal Achievement: 11/09/11 Potential to Achieve Goals: Good Pt will go Supine/Side to Sit: with modified independence;with HOB 0 degrees PT Goal: Supine/Side to Sit - Progress: Progressing toward goal Pt will go Sit to Supine/Side: with modified independence PT Goal: Sit to Supine/Side - Progress: Progressing toward goal Pt will go Sit to Stand: with modified independence PT Goal: Sit to Stand - Progress: Progressing toward goal Pt will go Stand to Sit: with modified independence PT Goal: Stand to Sit - Progress: Progressing toward goal Pt will Transfer Bed to Chair/Chair to Bed: with modified independence PT Transfer Goal: Bed to Chair/Chair to Bed - Progress: Progressing toward goal Pt will Ambulate: >150 feet;with modified independence;with rolling walker PT Goal: Ambulate - Progress: Progressing toward goal Pt will Perform Home Exercise Program: Independently PT Goal: Perform Home Exercise Program - Progress: Progressing toward goal  Visit Information  Last PT Received On: 11/03/11  Subjective Data  Subjective: I feel weak Patient Stated Goal: Walk without pain   Cognition  Overall Cognitive  Status: Appears within functional limits for tasks assessed/performed Arousal/Alertness: Awake/alert Orientation Level: Oriented X4 / Intact Behavior During Session: [facility] for tasks performed    Balance  Balance Balance Assessed: No  End of Session PT - End of Session Equipment Utilized During Treatment: Gait belt Activity Tolerance: Patient tolerated treatment well Patient left: in chair;with call bell/phone within reach Nurse Communication: Mobility status   GP     [name] 11/03/2011, 3:54 PM [name] [name] DPT [phone]

## 2011-11-03 NOTE — Progress Notes (Signed)
Physical Therapy Treatment Patient Details Name: [name] MRN: [id number] DOB: 06-30-1935 Today's Date: 11/03/2011 Time: 0815-0900 PT Time Calculation (min): 45 min  PT Assessment / Plan / Recommendation Comments on Treatment Session  Pt c/o feeling dizzy during session BP in sitting 139/55    Follow Up Recommendations  Home health PT;Supervision - Intermittent    Barriers to Discharge        Equipment Recommendations  Tub/shower bench    Recommendations for Other Services    Frequency 7X/week   Plan Discharge plan remains appropriate;Frequency remains appropriate    Precautions / Restrictions Precautions Precautions: Fall Restrictions Weight Bearing Restrictions: Yes RLE Weight Bearing: Weight bearing as tolerated    Pertinent Vitals/Pain Pt reports no pain at end of session.   See flow sheet for pain at start of session    Mobility  Bed Mobility Bed Mobility: Not assessed Transfers Transfers: Sit to Stand;Stand to Sit;Stand Pivot Transfers Sit to Stand: 3: Mod assist;With upper extremity assist;From chair/3-in-1;With armrests;4: Min assist Stand to Sit: 4: Min assist;To chair/3-in-1;With upper extremity assist Stand Pivot Transfers: 4: Min assist Details for Transfer Assistance: Cues for R LE positioning, Assist to initiate standing due to generalizd weakness.  Pt required Mod assist first two trials, min assist 3rd trial   Min assist to position R LE to sit.  Cueing for transfer technique.   Ambulation/Gait Ambulation/Gait Assistance: 4: Min guard Assistive device: Rolling walker Ambulation/Gait Assistance Details: Cues to increase gait speed.  Initially pt having difficulty remembering sequencing .  Pt gait improved with practice.  Pt ambulating with reciprocal gait after first 30 feet.    Gait Pattern: Step-to pattern;Step-through pattern;Decreased stance time - right;Decreased weight shift to right Gait velocity: Slow initially improved with cueing. 20 ft/29  seconds  General Gait Details: no KI when ambulating Stairs: Yes Stairs Assistance: 3: Mod assist Stairs Assistance Details (indicate cue type and reason): Assist to stabilize pt for pt to advance R LE,  Instruction and repeated cueing in sequencing pt leans over onto forearms to advance R LE.   Stair Management Technique: Two rails;Forwards Number of Stairs: 3  Wheelchair Mobility Wheelchair Mobility: No    Exercises Total Joint Exercises Quad Sets: AROM;10 reps Heel Slides: 10 reps;Right Goniometric ROM: 0-50 AAROM   PT Diagnosis:    PT Problem List:   PT Treatment Interventions:     PT Goals Acute Rehab PT Goals PT Goal Formulation: With patient Time For Goal Achievement: 11/09/11 Potential to Achieve Goals: Good Pt will go Supine/Side to Sit: with modified independence;with HOB 0 degrees Pt will go Sit to Stand: with modified independence PT Goal: Sit to Stand - Progress: Progressing toward goal Pt will go Stand to Sit: with modified independence PT Goal: Stand to Sit - Progress: Progressing toward goal Pt will Transfer Bed to Chair/Chair to Bed: with modified independence PT Transfer Goal: Bed to Chair/Chair to Bed - Progress: Progressing toward goal Pt will Ambulate: >150 feet;with modified independence;with rolling walker PT Goal: Ambulate - Progress: Progressing toward goal Pt will Go Up / Down Stairs: 3-5 stairs;with supervision;with least restrictive assistive device PT Goal: Up/Down Stairs - Progress: Progressing toward goal Pt will Perform Home Exercise Program: Independently PT Goal: Perform Home Exercise Program - Progress: Progressing toward goal  Visit Information  Last PT Received On: 11/03/11    Subjective Data  Subjective: I feel better today. Patient Stated Goal: Walk without pain   Cognition  Overall Cognitive Status: Appears within functional limits  for tasks assessed/performed Arousal/Alertness: Awake/alert Orientation Level: Oriented X4 /  Intact Behavior During Session: [facility] for tasks performed    Balance  Balance Balance Assessed: Yes Static Sitting Balance Static Sitting - Balance Support: Feet supported Static Sitting - Level of Assistance: 7: Independent Static Standing Balance Static Standing - Level of Assistance: 4: Min assist Static Standing - Comment/# of Minutes: Pt had difficulty establishing standing balance initialy.  Pt presents with posterior lean and wb primarily through heels.  Assist to shifft COG forward.  Once balance established pt able to maintain.     End of Session PT - End of Session Equipment Utilized During Treatment: Gait belt Activity Tolerance: Patient tolerated treatment well Patient left: in chair;with call bell/phone within reach Nurse Communication: Mobility status   GP     [name] 11/03/2011, 9:16 AM [name] DPT [phone]

## 2011-11-03 NOTE — Progress Notes (Signed)
Referral received for SNF. Chart reviewed and CSW has spoken with RNCM who indicates that patient is for DC to home with Home Health and DME. Patient discussed with PT as well. Will return home with husband.  CSW to sign off. Please re-consult if CSW needs arise.  Lorri Frederick. [place]  [phone]

## 2011-11-03 NOTE — Progress Notes (Signed)
     Subjective:  Patient reports pain as mild.  Making progress with physical therapy, although she's not sure she's going to be independent enough to go home. She did go to skilled nursing after her last knee replacement on the other side.  Objective:   Estimated Body mass index is 34.22 kg/(m^2) as calculated from the following:   Height as of 07/27/11: 5\' 6" (1.676 m).   Weight as of 08/22/11: 212 lb(96.163 kg).  VITALS:   Filed Vitals:   11/02/11 1600 11/02/11 2226 11/03/11 0545 11/03/11 0746  BP:  102/49 116/54   Pulse:  54 69   Temp:  99.7 F (37.6 C) 99.4 F (37.4 C)   TempSrc:      Resp: 20 16 16 16   SpO2: 96% 90% 91%     Neurologically intact Sensation intact distally Dorsiflexion/Plantar flexion intact  LABS  Results for orders placed during the hospital encounter of 11/01/11 (from the past 24 hour(s))  CBC     Status: Abnormal   Collection Time   11/03/11  6:35 AM      Component Value Range   WBC 13.1 (*) 4.0 - 10.5 K/uL   RBC 3.84 (*) 3.87 - 5.11 MIL/uL   Hemoglobin 10.1 (*) 12.0 - 15.0 g/dL   HCT 16.1 (*) 09.6 - 04.5 %   MCV 81.0  78.0 - 100.0 fL   MCH 26.3  26.0 - 34.0 pg   MCHC 32.5  30.0 - 36.0 g/dL   RDW 40.9  81.1 - 91.4 %   Platelets 215  150 - 400 K/uL    X-ray Knee Right Port  11/01/2011  *RADIOLOGY REPORT*  Clinical Data: 76 year old female status post right knee surgery.  PORTABLE RIGHT KNEE - 1-2 VIEW  Comparison: None.  Findings: Portable AP and cross-table lateral views.  Sequelae of total knee arthroplasty.  Hardware components appear intact and normally aligned.  Postoperative changes to the surrounding soft tissues including subcutaneous gas and suprapatellar joint effusion.  Dystrophic calcification in the lateral right five.  No unexpected osseous changes.  IMPRESSION: Postoperative changes from right total knee arthroplasty.   Original Report Authenticated By: Harley Hallmark, M.D.     Assessment/Plan: 2 Days Post-Op   Principal  Problem:  *Osteoarthritis of right knee   Advance diet Up with therapy Plan for discharge tomorrow Discharge home with home health If she does not regain enough independence, then she may need skilled nursing placement.   Danai Gotto P 11/03/2011, 8:23 AM   Teryl Lucy, MD Cell [phone] Pager [phone]
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
## 2011-11-04 LAB — CBC
Hemoglobin: 9.6 g/dL — ABNORMAL LOW (ref 12.0–15.0)
MCH: 26.4 pg (ref 26.0–34.0)
MCV: 79.6 fL (ref 78.0–100.0)
Platelets: 220 10*3/uL (ref 150–400)
RBC: 3.63 MIL/uL — ABNORMAL LOW (ref 3.87–5.11)
WBC: 12.6 10*3/uL — ABNORMAL HIGH (ref 4.0–10.5)

## 2011-11-04 NOTE — Clinical Social Work Note (Signed)
Clinical Social Work Department BRIEF PSYCHOSOCIAL ASSESSMENT 11/04/2011  Patient:  [name], [name]     Account Number:  [id number]     Admit date:  11/01/2011  Clinical Social Worker:  Pearson Forster  Date/Time:  11/04/2011 02:00 PM  Referred by:  RN  Date Referred:  11/04/2011 Referred for  SNF Placement   Other Referral:   Interview type:  Patient Other interview type:   Patient husband over the phone    PSYCHOSOCIAL DATA Living Status:  FAMILY Admitted from facility:   Level of care:   Primary support name:  Chia Mowers  [phone] Primary support relationship to patient:  SPOUSE Degree of support available:   Strong    CURRENT CONCERNS Current Concerns  Post-Acute Placement   Other Concerns:   PT to work with patient again in the afternoon to determine final discharge plan    SOCIAL WORK ASSESSMENT / PLAN Clinical Social Worker spoke with patient at bedside after receiving a phone call about possible placement needs for patient at discharge.  Patient states that she lives at home with her husband, daughter, and granddaughter and was planning to return home with home healthcare.  Patient understands that there is a possible need for ST-SNF pending PT final evaluation.  Patient states that she has been to [facility] in the past and if she has to return to ST-SNF that is her facility preference.  CSW spoke with patient husband over the phone per patient request to update patient husband on possible discharge plans.    Clinical Social Worker will complete FL2 and iniate ST-SNF referral to [facility]	[place].  Once follow up PT notes are on the chart, CSW to submit for insurance authorization.  CSW to facilitate patient discharge needs if ST-SNF remains appropriate at discharge.  CSW available for support as needed.   Assessment/plan status:  Psychosocial Support/Ongoing Assessment of Needs Other assessment/ plan:   Information/referral to community resources:   No  resources provided at this time per patient request.    PATIENT'S/FAMILY'S RESPONSE TO PLAN OF CARE: Patient alert and oriented x3 upon CSW assessment.  RN states that patient has periods of confusion but at the current time patient does not show any signs of confusion. Patient lives in a supportive environment with other family members and would have support if plan is to return home. Patient and patient husband are agreeable to placement if needed.    Macario Golds, [place] [phone]

## 2011-11-04 NOTE — Clinical Social Work Note (Addendum)
Clinical Social Work Department CLINICAL SOCIAL WORK PLACEMENT NOTE 11/04/2011  Patient:  [name], [name]  Account Number:  [id number] Admit date:  11/01/2011  Clinical Social Worker:  Macario Golds, Theresia Majors  Date/time:  11/04/2011 02:00 PM  Clinical Social Work is seeking post-discharge placement for this patient at the following level of care:   SKILLED NURSING   (*CSW will update this form in Epic as items are completed)   11/04/2011  Patient/family provided with [facility] Department of Clinical Social Work's list of facilities offering this level of care within the geographic area requested by the patient (or if unable, by the patient's family).  11/04/2011  Patient/family informed of their freedom to choose among providers that offer the needed level of care, that participate in Medicare, Medicaid or managed care program needed by the patient, have an available bed and are willing to accept the patient.  11/04/2011  Patient/family informed of [facility]' ownership interest in [facility], as well as of the fact that they are under no obligation to receive care at this facility.  PASARR submitted to EDS on 11/04/2011 PASARR number received from EDS on 11/04/2011  FL2 transmitted to all facilities in geographic area requested by pt/family on  11/04/2011 FL2 transmitted to all facilities within larger geographic area on   Patient informed that his/her managed care company has contracts with or will negotiate with  certain facilities, including the following:     Patient/family informed of bed offers received:  11/07/2011 Patient chooses bed at  [facility] Physician recommends and patient chooses bed at    Patient to be transferred to [facility] on  11/07/2011 Patient to be transferred to facility by  Ambulance  The following physician request were entered in Epic:   Additional Comments: 11/04/2011 - Patient has previously been to [facility] and would  like to return if possible  Macario Golds, LCSW [phone]

## 2011-11-04 NOTE — Progress Notes (Signed)
PHYSICAL THERAPY PROGRESS NOTE:  11/04/2011  Pt has struggled with bed mobility and transfers and present with significant falls risk without assistance. Pt has been in hospital for three day and neither spouse nor daughter have come to visit (per pt report, PT and Nsg observations) Unclear how much assistance family can provide at home. Pt reports daughter works day shift and husband "probably couldn't help me much" per pt. Pt's present with mild confusion which I have noticed over the past 2 days. Pt''s may need to consider short term SNF placement for rehab. To discharge home safely pt will need to be performing at no less than a supervision level wtih all mobility. Acute PT will continue to follow and assess pt for most appropriate discharge plan.   [name] L. [name] DPT [phone]

## 2011-11-04 NOTE — Progress Notes (Signed)
Physical Therapy Treatment Patient Details Name: [name] MRN: [id number] DOB: 1935-07-11 Today's Date: 11/04/2011 Time: 4098-1191 PT Time Calculation (min): 43 min  PT Assessment / Plan / Recommendation Comments on Treatment Session  Pt has struggled with bed mobility and transfers and present with significant falls risk without assistance.  Pt has been in hospital for three day and neither spouse nor daughter have come to visit (per pt report, PT and Nsg observations)  Unclear how much assistance family can provide at home.  Pt reports daughter works day shift and husband "probably couldn't help me much"  per pt.  Pt's present with mild confusion which I have noticed over the past 2 days.  Pt''s may need to consider short term SNF placement for rehab.  To discharge home safely pt will need to be performing at no less than a supervision level wtih all mobility.  Acute PT will continue to follow and assess pt for most appropriate discharge plan.      Follow Up Recommendations  Skilled nursing facility;Supervision/Assistance - 24 hour    Barriers to Discharge        Equipment Recommendations  Tub/shower bench    Recommendations for Other Services    Frequency 7X/week   Plan Discharge plan needs to be updated;Frequency remains appropriate    Precautions / Restrictions Precautions Precautions: Fall;Knee Precaution Booklet Issued: Yes (comment) Precaution Comments: No Pillow under Right knee Restrictions Weight Bearing Restrictions: Yes RLE Weight Bearing: Weight bearing as tolerated   Pertinent Vitals/Pain Pt c//o pain in Rt knee 5-7/10 Rn to medicate pt after session.     Mobility  Bed Mobility Bed Mobility: Supine to Sit;Sitting - Scoot to Edge of Bed Supine to Sit: 3: Mod assist;HOB flat Supine to Sit: Patient Percentage: 70% Sitting - Scoot to Edge of Bed: 3: Mod assist Sitting - Scoot to [employer] of Bed: Patient Percentage: 70% Details for Bed Mobility Assistance: Pt  requires cueing for sequencing, Assist for R LE due to weakness, Attempted with leg lifter but pt unable to complete task without assistance.  Assist to manage R LE to scoot to edge of bed secondarty to limited knee flexion and pain.   Transfers Transfers: Sit to Stand;Stand to [employer] Transfers Sit to Stand: 3: Mod assist;From bed;With upper extremity assist;From chair/3-in-1 (two trial from bed, 1 trial from recliner. ) Stand to Sit: 4: Min assist;With upper extremity assist;To bed;To chair/3-in-1;With armrests Stand Pivot Transfers: 4: Min assist Details for Transfer Assistance: Unable to determine most appropriate method (bilateral LE position) to stand/sit. Pt attempting to extend non-operative LE and operative LE to stand. Required instruction with each attempt.  Pt continues to present with posterior lean in standing (initially)  Requires tactile cueing to shift wt anteriorly.   Ambulation/Gait Ambulation/Gait Assistance: 4: Min guard;3: Mod assist Ambulation Distance (Feet): 20 Feet Assistive device: Rolling walker Ambulation/Gait Assistance Details: Initially pt required mod assist to stabilize pt and support pt's body wt during L swing phase. After first few steps pt able to ambulate with min guard assist.  Pt required verbal cueing for gait sequence. Waited for pt to initiate gait before providing instruction.  Pt stated "I go with the Left foot first?"  Gait Pattern: Step-to pattern;Step-through pattern;Decreased stance time - right;Decreased weight shift to right Gait velocity: Gait much slower this session <.75ft/sec General Gait Details: After pt completed gait training she infromed me of pain in her right hand when using walker. Did not that pt required repeated cues  to place her right hand on the hand grip and not the metal frame of the walker.   Stairs: No    Exercises Total Joint Exercises Ankle Circles/Pumps: AROM;10 reps;Seated Quad Sets: 5 reps;Right Short Arc Quad: 5  reps;Right;AAROM;Strengthening (Pt did not tolerate well. ) Heel Slides: 10 reps;Right;AAROM Goniometric ROM: 6-45 degrees AAROM limited by pain.   PT Diagnosis:    PT Problem List:   PT Treatment Interventions:     PT Goals Acute Rehab PT Goals PT Goal Formulation: With patient Time For Goal Achievement: 11/09/11 Potential to Achieve Goals: Fair Pt will go Supine/Side to Sit: with supervision;with HOB 0 degrees PT Goal: Supine/Side to Sit - Progress: Progressing toward goal Pt will go Sit to Supine/Side: with supervision;with HOB 0 degrees PT Goal: Sit to Supine/Side - Progress: Progressing toward goal Pt will go Sit to Stand: with supervision PT Goal: Sit to Stand - Progress: Revised due to lack of progress Pt will go Stand to Sit: with supervision PT Goal: Stand to Sit - Progress: Revised due to lack of progress Pt will Transfer Bed to Chair/Chair to Bed: with supervision PT Transfer Goal: Bed to Chair/Chair to Bed - Progress: Revised due to lack of progress Pt will Ambulate: 51 - 150 feet;with supervision;with rolling walker PT Goal: Ambulate - Progress: Revised due to lack of progress Pt will Go Up / Down Stairs: 3-5 stairs;with min assist;with rail(s) PT Goal: Up/Down Stairs - Progress: Revised due to lack of progress Pt will Perform Home Exercise Program: with supervision, verbal cues required/provided PT Goal: Perform Home Exercise Program - Progress: Revised due to lack of progress  Visit Information  Last PT Received On: 11/04/11    Subjective Data  Subjective: I cant go home tomorrow, I am too weak, my right hand is weak and hurts when I use the walker.     Cognition  Overall Cognitive Status: Impaired Area of Impairment: Memory;Following commands;Problem solving Arousal/Alertness: Awake/alert Orientation Level: Appears intact for tasks assessed Behavior During Session: [name] for tasks performed Memory: Decreased recall of precautions Memory Deficits: Pt having  difficulty recalling techniques for bed mobility, transfers and gait.  Following Commands: Follows one step commands with increased time (Delay of several seconds ) Problem Solving: Unable to determine most appropriate method (bilateral LE position) to stand/sit.  Pt attempting to extend non-operative LE and operative LE to stand. Required instruction with each attempt.      Balance  Static Sitting Balance Static Sitting - Balance Support: Feet supported Static Sitting - Level of Assistance: 7: Independent Static Standing Balance Static Standing - Balance Support: Bilateral upper extremity supported Static Standing - Level of Assistance: 3: Mod assist Static Standing - Comment/# of Minutes: Pt had difficulty establishing standing balance initialy. Pt presents with posterior lean and wb primarily through heels. Assist to shifft COG forward. Once balance established pt able to maintain.   End of Session PT - End of Session Equipment Utilized During Treatment: Gait belt Activity Tolerance: Patient tolerated treatment well Patient left: in chair;with call bell/phone within reach Nurse Communication: Mobility status;Patient requests pain meds;Other (comment) (Change in discharge status. )   GP     Kamya Watling 11/04/2011, 10:08 AM Theron Arista L. Axiel Fjeld DPT [phone]

## 2011-11-04 NOTE — Progress Notes (Signed)
Patient ID: AZARIE CORIZ, female   DOB: 04-03-1935, 76 y.o.   MRN: [id number]     Subjective:  Patient reports pain as mild to moderate.  She states that she is much better today.  She denies any CP or SOB.  Objective:   VITALS:   Filed Vitals:   11/03/11 1600 11/03/11 2055 11/04/11 0443 11/04/11 0736  BP: 126/54 114/78 129/66   Pulse: 60 89 67   Temp: 98.9 F (37.2 C) 98.6 F (37 C) 98.4 F (36.9 C)   TempSrc:      Resp: 18 18 18 16   SpO2: 93% 93% 95%     ABD soft Sensation intact distally Dorsiflexion/Plantar flexion intact Incision: moderate drainage Dressing removed and wound clean and dry. No sign of infection. Dry dressing applied.  LABS  Results for orders placed during the hospital encounter of 11/01/11 (from the past 24 hour(s))  CBC     Status: Abnormal   Collection Time   11/04/11  5:35 AM      Component Value Range   WBC 12.6 (*) 4.0 - 10.5 K/uL   RBC 3.63 (*) 3.87 - 5.11 MIL/uL   Hemoglobin 9.6 (*) 12.0 - 15.0 g/dL   HCT 14.7 (*) 82.9 - 56.2 %   MCV 79.6  78.0 - 100.0 fL   MCH 26.4  26.0 - 34.0 pg   MCHC 33.2  30.0 - 36.0 g/dL   RDW 13.0  86.5 - 78.4 %   Platelets 220  150 - 400 K/uL    No results found.  Assessment/Plan: 3 Days Post-Op   Principal Problem:  *Osteoarthritis of right knee Active Problems:  Obesity (BMI 30.0-34.9)   Advance diet Up with therapy Plan for discharge tomorrow Planning for DC home sat vs. [place] Citizen 11/04/2011, 7:42 AM   Teryl Lucy, MD Cell [phone] Pager [phone]

## 2011-11-04 NOTE — Progress Notes (Signed)
Physical Therapy Treatment Patient Details Name: [name] MRN: [id number] DOB: 06-13-1935 Today's Date: 11/04/2011 Time: 4098-1191 PT Time Calculation (min): 17 min  PT Assessment / Plan / Recommendation Comments on Treatment Session  Pt declined OOB activity stating fatigue and pain.  Pt agreed to perform ther ex  in supine.  ROM continues to be limited.by pain.      Follow Up Recommendations  Skilled nursing facility;Supervision/Assistance - 24 hour    Barriers to Discharge        Equipment Recommendations  Defer to next venue    Recommendations for Other Services    Frequency 7X/week   Plan Discharge plan remains appropriate;Frequency remains appropriate    Precautions / Restrictions Precautions Precautions: Fall;Knee Precaution Booklet Issued: Yes (comment) Precaution Comments: No Pillow under Right knee Required Braces or Orthoses: Knee Immobilizer - Right Knee Immobilizer - Right: On except when in CPM Restrictions Weight Bearing Restrictions: Yes RLE Weight Bearing: Weight bearing as tolerated   Pertinent Vitals/Pain 5/10 pain in Right knee. Premedicated.     Mobility  Bed Mobility Bed Mobility: Not assessed Transfers Transfers: Not assessed Ambulation/Gait Ambulation/Gait Assistance: Not tested (comment)    Exercises Total Joint Exercises Ankle Circles/Pumps: AROM;10 reps;Seated Quad Sets: 5 reps;Right Short Arc Quad: 5 reps;Right;AAROM;Strengthening Heel Slides: 10 reps;Right;AAROM Straight Leg Raises: 5 reps;Right;AROM;Strengthening   PT Diagnosis:    PT Problem List:   PT Treatment Interventions:     PT Goals Acute Rehab PT Goals PT Goal Formulation: With patient Time For Goal Achievement: 11/09/11 Potential to Achieve Goals: Fair Pt will Perform Home Exercise Program: with supervision, verbal cues required/provided PT Goal: Perform Home Exercise Program - Progress: Progressing toward goal  Visit Information  Last PT Received On:  11/04/11    Subjective Data  Subjective: I am going to rehab Patient Stated Goal: Walk without pain   Cognition  Overall Cognitive Status: Impaired Area of Impairment: Memory;Following commands;Problem solving Arousal/Alertness: Awake/alert Orientation Level: Appears intact for tasks assessed Behavior During Session: [facility] for tasks performed Memory: Decreased recall of precautions Memory Deficits: Pt having difficulty recalling techniques for bed mobility, transfers and gait.  Following Commands: Follows one step commands with increased time    Balance     End of Session PT - End of Session Equipment Utilized During Treatment: Gait belt Activity Tolerance: Patient limited by pain;Patient limited by fatigue Patient left: in bed;with call bell/phone within reach;with bed alarm set Nurse Communication: Mobility status   GP     Chrsitopher Wik 11/04/2011, 3:18 PM Govind Furey L. Erilyn Pearman DPT [phone]

## 2011-11-05 NOTE — Progress Notes (Signed)
Orthopaedic Trauma Service (OTS)  Subjective: 4 Days Post-Op Procedure(s) (LRB): TOTAL KNEE ARTHROPLASTY (Right)    Progressing slowly Doing ok this am Pain controlled Denies CP, No sob Feeling much better than she did yesterday  Objective: Current Vitals Blood pressure 125/62, pulse 65, temperature 98.2 F (36.8 C), temperature source Oral, resp. rate 20, height 5\' 6"  (1.676 m), weight 91.173 kg (201 lb), SpO2 93.00%. Vital signs in last 24 hours: Temp:  [98.2 F (36.8 C)-98.7 F (37.1 C)] 98.2 F (36.8 C) (08/24 0648) Pulse Rate:  [60-71] 65  (08/24 0648) Resp:  [16-20] 20  (08/24 0800) BP: (120-126)/(55-84) 125/62 mmHg (08/24 0648) SpO2:  [91 %-93 %] 93 % (08/24 0800) Weight:  [91.173 kg (201 lb)] 91.173 kg (201 lb) (08/23 1200)  Intake/Output from previous day: 08/23 0701 - 08/24 0700 In: 1080 [P.O.:1080] Out: -   LABS  Basename 11/04/11 0535 11/03/11 0635  HGB 9.6* 10.1*    Basename 11/04/11 0535 11/03/11 0635  WBC 12.6* 13.1*  RBC 3.63* 3.84*  HCT 28.9* 31.1*  PLT 220 215   No results found for this basename: NA:2,K:2,CL:2,CO2:2,BUN:2,CREATININE:2,GLUCOSE:2,CALCIUM:2 in the last 72 hours No results found for this basename: LABPT:2,INR:2 in the last 72 hours   Physical Exam  Gen: appears well, NAD, sitting in beside chair Lungs:decreased at bases but o/w clear Cardiac:s1 and s2 Abd:+ BS Ext: Right lower Extremity  Dressing is stable  Pt resting with knee in flexion in chair- cannot find knee immobilizer, placed rolled up towel under ankle  Distal motor and sensory functions intact  Swelling controlled  + DP pulse   Imaging No results found.  Assessment/Plan: 4 Days Post-Op Procedure(s) (LRB): TOTAL KNEE ARTHROPLASTY (Right)  76 y/o female s/p R TKA  1. R TKA pod 4  WBAT  Total knee precautions- pt needs to be in KI when not in CPM. Knee needs to be in full extension  PT/OT  TED hose  See how pt does today with therapy, PT recs  SNF 2. HTN/CAD  Stable  Home meds 3. DVT/PE prophylaxis  Xarelto 4. FEN  Diet as tolerated  KVO IVF 5. misc  Continue IS  obesity 6. Pain  Low dose narcotics, minimize narcotic use  tylenol 7. Dispo  Continue with therapies  Consider CIR consult depending on how pt does today      Mearl Latin, PA-C [facility] [phone] (P) 11/05/2011, 11:37 AM

## 2011-11-05 NOTE — Progress Notes (Signed)
Physical Therapy Treatment Patient Details Name: [name] MRN: [id number] DOB: 01-03-1936 Today's Date: 11/05/2011 Time: 4098-1191 PT Time Calculation (min): 31 min  PT Assessment / Plan / Recommendation Comments on Treatment Session  Pt stated she felt her mobility improved today.  Difficulty with sit to stand and initial ambulation.  Appears motivated to increase mobiility.    Follow Up Recommendations  Skilled nursing facility;Supervision/Assistance - 24 hour    Barriers to Discharge        Equipment Recommendations  Defer to next venue    Recommendations for Other Services    Frequency 7X/week   Plan Discharge plan remains appropriate;Frequency remains appropriate    Precautions / Restrictions Precautions Precautions: Fall;Knee Precaution Comments: No Pillow under Right knee Restrictions Weight Bearing Restrictions: Yes RLE Weight Bearing: Weight bearing as tolerated   Pertinent Vitals/Pain 3/10 RLE-premedicated prior to treatment;Ice applied after exercises    Mobility  Bed Mobility Bed Mobility: Not assessed Transfers Transfers: Sit to Stand;Stand to Sit Sit to Stand: 3: Mod assist;From chair/3-in-1;With upper extremity assist;With armrests Stand to Sit: 4: Min assist;With upper extremity assist;With armrests;To chair/3-in-1 Details for Transfer Assistance: Pt needed cues for technique, hand placement and LE positioning.  Pt with significant posterior lean upon standing with increased diffficulty getting trunk over BOS without moderate assist to maintain balance.  Cues to control descent. Ambulation/Gait Ambulation/Gait Assistance: 4: Min assist Ambulation Distance (Feet): 35 Feet Assistive device: Rolling walker Ambulation/Gait Assistance Details: Initially required verbal cues to sequence and for walker placement but improved with increased distance. Gait Pattern: Step-to pattern;Decreased stance time - right;Antalgic Gait velocity: decreased Stairs: No    Exercises Total Joint Exercises Ankle Circles/Pumps: AROM;10 reps;Seated Quad Sets: AROM;Right;10 reps;Supine Straight Leg Raises: AAROM;Right;10 reps;Supine Long Arc Quad: AAROM;Right;10 reps;Seated Knee Flexion: AAROM;Right;10 reps;Seated   PT Goals Acute Rehab PT Goals Time For Goal Achievement: 11/09/11 Potential to Achieve Goals: Fair PT Goal: Ambulate - Progress: Progressing toward goal PT Goal: Perform Home Exercise Program - Progress: Progressing toward goal  Visit Information  Last PT Received On: 11/05/11 Assistance Needed: +1    Subjective Data  Subjective: "I hope I can get this knee moving like my other one."   Cognition  Overall Cognitive Status: Appears within functional limits for tasks assessed/performed Arousal/Alertness: Awake/alert Orientation Level: Appears intact for tasks assessed Behavior During Session: [facility] for tasks performed Following Commands: Follows one step commands consistently         End of Session PT - End of Session Equipment Utilized During Treatment: Gait belt Activity Tolerance: Patient tolerated treatment well;Patient limited by fatigue Patient left: in chair;with call bell/phone within reach Nurse Communication: Mobility status        [name] 11/05/2011, 11:03 AM  [name], PTA [phone]

## 2011-11-05 NOTE — Progress Notes (Signed)
Orthopedic Tech Progress Note Patient Details:  [name] Jan 02, 1936 [id number]  Ortho Devices Type of Ortho Device: Knee Immobilizer Ortho Device/Splint Interventions: Application   Shawnie Pons 11/05/2011, 12:35 PM

## 2011-11-06 DIAGNOSIS — K219 Gastro-esophageal reflux disease without esophagitis: Secondary | ICD-10-CM | POA: Diagnosis present

## 2011-11-06 NOTE — Progress Notes (Signed)
Orthopaedic Trauma Service (OTS)  Subjective: 5 Days Post-Op Procedure(s) (LRB): TOTAL KNEE ARTHROPLASTY (Right)    Doing well this am  No changes Still with difficulty with therapies, reviewed note from yesterday Pt agreeable to SNF  Objective: Current Vitals Blood pressure 141/56, pulse 70, temperature 98.3 F (36.8 C), temperature source Oral, resp. rate 18, height 5\' 6"  (1.676 m), weight 91.173 kg (201 lb), SpO2 95.00%. Vital signs in last 24 hours: Temp:  [98 F (36.7 C)-98.3 F (36.8 C)] 98.3 F (36.8 C) (08/25 0642) Pulse Rate:  [66-70] 70  (08/25 0642) Resp:  [18-20] 18  (08/25 0642) BP: (132-145)/(56-62) 141/56 mmHg (08/25 0642) SpO2:  [92 %-95 %] 95 % (08/25 0642)  Intake/Output from previous day: 08/24 0701 - 08/25 0700 In: 120 [P.O.:120] Out: -  Intake/Output      08/24 0701 - 08/25 0700 08/25 0701 - 08/26 0700   P.O. 120    Total Intake(mL/kg) 120 (1.3)    Net +120         Urine Occurrence 3 x       LABS  Basename 11/04/11 0535  HGB 9.6*    Basename 11/04/11 0535  WBC 12.6*  RBC 3.63*  HCT 28.9*  PLT 220   No results found for this basename: NA:2,K:2,CL:2,CO2:2,BUN:2,CREATININE:2,GLUCOSE:2,CALCIUM:2 in the last 72 hours No results found for this basename: LABPT:2,INR:2 in the last 72 hours   Physical Exam  Gen: resting comfortably in bed Lungs:clear anterior fields Cardiac:s1 and s2 Abd:+ BS, NT Ext:   Right Lower Extremity   Wound looks excellent   Scant drainage on dressing   Distal motor and sensory functions intact   Ext is warm    + DP pulse   Swelling stable (pt has been wearing TED hose but just took off, confirmed with nurse)      Imaging No results found.  Assessment/Plan: 5 Days Post-Op Procedure(s) (LRB): TOTAL KNEE ARTHROPLASTY (Right)  76 y/o female s/p R TKA   1. R TKA pod 5   WBAT   Total knee precautions- pt needs to be in KI when not in CPM. Knee needs to be in full extension   PT/OT   TED hose    SNF  2. HTN/CAD/GERD  Stable   Home meds  3. DVT/PE prophylaxis   Xarelto  4. FEN   Diet as tolerated   KVO IVF  5. misc   Continue IS   obesity  6. Pain   Low dose narcotics, minimize narcotic use   tylenol  7. Dispo   Continue with therapies   SNF likely tomorrow    Mearl Latin, PA-C [facility] [phone] (P) 11/06/2011, 9:01 AM

## 2011-11-06 NOTE — Progress Notes (Signed)
Physical Therapy Treatment Patient Details Name: [name] MRN: [id number] DOB: [birth year] Today's Date: 11/06/2011 Time: 4098-1191 PT Time Calculation (min): 24 min  PT Assessment / Plan / Recommendation Comments on Treatment Session  Still needed increased assist to stand vs with bed mobility and gait. Limited gait distance today due to lunch arrived and pt ready to eat.    Follow Up Recommendations  Skilled nursing facility;Supervision/Assistance - 24 hour       Equipment Recommendations  Defer to next venue       Frequency 7X/week   Plan Discharge plan remains appropriate;Frequency remains appropriate    Precautions / Restrictions Precautions Precautions: Knee Required Braces or Orthoses: Knee Immobilizer - Right Knee Immobilizer - Right: On except when in CPM Restrictions RLE Weight Bearing: Weight bearing as tolerated    Pertinent Vitals/Pain "It's okay", right knee pain before and after session. Premedicated by RN.    Mobility  Bed Mobility Bed Mobility: Supine to Sit Supine to Sit: 4: Min assist;HOB elevated (HOB elevated to 30 degrees) Supine to Sit: Patient Percentage: 80% Sitting - Scoot to Edge of Bed: 5: Supervision Transfers Transfers: Sit to Stand;Stand to Sit Sit to Stand: 4: Min assist;From bed;With upper extremity assist Stand to Sit: 4: Min assist;To chair/3-in-1;With upper extremity assist;With armrests Ambulation/Gait Ambulation/Gait Assistance: 4: Min assist Ambulation Distance (Feet): 30 Feet Assistive device: Rolling walker Ambulation/Gait Assistance Details: cues for posture, sequence and walker placement with gait. Gait Pattern: Step-to pattern;Antalgic;Decreased stance time - right;Decreased step length - left    Exercises Total Joint Exercises Ankle Circles/Pumps: AROM;Both;10 reps;Supine Quad Sets: AROM;Strengthening;Right;10 reps;Supine Heel Slides: AAROM;Strengthening;Supine;Right;10 reps Hip ABduction/ADduction:  AAROM;Strengthening;Right;10 reps;Supine Straight Leg Raises: AAROM;Strengthening;Right;10 reps;Supine      PT Goals Acute Rehab PT Goals PT Goal: Supine/Side to Sit - Progress: Progressing toward goal PT Goal: Sit to Supine/Side - Progress: Progressing toward goal PT Goal: Sit to Stand - Progress: Progressing toward goal PT Goal: Stand to Sit - Progress: Progressing toward goal PT Goal: Ambulate - Progress: Progressing toward goal PT Goal: Perform Home Exercise Program - Progress: Progressing toward goal  Visit Information  Last PT Received On: 11/06/11 Assistance Needed: +1    Subjective Data  Subjective: No new complaints, agreeable to therapy today.   Cognition  Overall Cognitive Status: Appears within functional limits for tasks assessed/performed Arousal/Alertness: Awake/alert Orientation Level: Appears intact for tasks assessed Behavior During Session: [facility] for tasks performed Following Commands: Follows one step commands consistently       End of Session PT - End of Session Equipment Utilized During Treatment: Gait belt;Right knee immobilizer Activity Tolerance: Patient tolerated treatment well;Patient limited by fatigue Patient left: in chair;with call bell/phone within reach Nurse Communication: Mobility status   GP     Sallyanne Kuster 11/06/2011, 12:42 PM  Sallyanne Kuster, PTA Office- [phone]

## 2011-11-07 ENCOUNTER — Encounter: Payer: Self-pay | Admitting: Internal Medicine

## 2011-11-07 NOTE — Clinical Social Work Note (Signed)
Clinical Social Worker spoke with patient at bedside who adamantly states that she wants to return home at discharge and no longer wants to entertain the idea of SNF placement.  CSW had made arrangements and receieved insurance authorization from [facility], however patient states that she is returning home with her husband and daughter.  CSW spoke with PT who states patient is stable enough to return home with assistance.  CSW spoke with patient husband over the phone who has agreed to take patient home and provide support as needed.  CSW to notify CM of patient home health needs prior to discharge.  Clinical Social Worker will sign off for now as social work intervention is no longer needed. Please consult [place] again if new need arises.  Macario Golds, [place] [phone]

## 2011-11-07 NOTE — Progress Notes (Signed)
Occupational Therapy Treatment Patient Details Name: [name] MRN: [id number] DOB: November 26, 1935 Today's Date: 11/07/2011 Time: 8295-6213 OT Time Calculation (min): 15 min  OT Assessment / Plan / Recommendation Comments on Treatment Session Pt's husband demonstrating the ability to mobilize pt to bathroom and then to bed.  Husband is willing to assist, but pt could benefit from [facility] as well as a home health aide to assist with ADL at home.    Follow Up Recommendations  Home health OT;Supervision/Assistance - 24 hour    Barriers to Discharge       Equipment Recommendations  None recommended by OT    Recommendations for Other Services    Frequency     Plan Discharge plan remains appropriate    Precautions / Restrictions Precautions Precautions: Knee Required Braces or Orthoses: Knee Immobilizer - Right Knee Immobilizer - Right: On except when in CPM Restrictions Weight Bearing Restrictions: Yes RLE Weight Bearing: Weight bearing as tolerated   Pertinent Vitals/Pain No c/o pain    ADL  Grooming: Performed;Supervision/safety;Wash/dry hands Where Assessed - Grooming: Supported standing Lower Body Bathing: Simulated;Supervision/safety Where Assessed - Lower Body Bathing: Supported sit to Pharmacist, hospital: Performed;Min Pension scheme manager Method: Sit to Barista: Comfort height toilet;Grab bars Toileting - Architect and Hygiene: Performed;Independent Where Assessed - Toileting Clothing Manipulation and Hygiene: Sit on 3-in-1 or toilet Equipment Used: Gait belt;Rolling walker;Knee Immobilizer Transfers/Ambulation Related to ADLs: husband assisting pt to bathroom upon OTs arrival ADL Comments: Pt will rely on her husband for LB dressing.  Issued long handled bath sponge.    OT Diagnosis:    OT Problem List:   OT Treatment Interventions:     OT Goals Acute Rehab OT Goals OT Goal Formulation: With patient Time For Goal  Achievement: 11/07/11 Potential to Achieve Goals: Good ADL Goals Pt Will Perform Grooming: with supervision;Standing at sink ADL Goal: Grooming - Progress: Met Pt Will Perform Lower Body Bathing: with min assist;Sit to stand from bed;with caregiver independent in assisting;with adaptive equipment ADL Goal: Lower Body Bathing - Progress: Met Pt Will Perform Toileting - Clothing Manipulation: with supervision;Standing ADL Goal: Toileting - Clothing Manipulation - Progress: Met Pt Will Perform Toileting - Hygiene: with supervision;Sit to stand from 3-in-1/toilet ADL Goal: Toileting - Hygiene - Progress: Met  Visit Information  Last OT Received On: 11/07/11 Assistance Needed: +1    Subjective Data      Prior Functioning       Cognition  Overall Cognitive Status: Appears within functional limits for tasks assessed/performed Area of Impairment: Memory Arousal/Alertness: Awake/alert Orientation Level: Appears intact for tasks assessed Behavior During Session: [facility] for tasks performed Memory Deficits: Pt writing down sequence for sit to stand, stand to sit.    Mobility Bed Mobility Bed Mobility: Sit to Supine Sit to Supine: 4: Min assist;HOB flat Details for Bed Mobility Assistance: Assist for R LE only. Transfers Transfers: Sit to Stand;Stand to Sit Sit to Stand: From toilet;From chair/3-in-1;4: Min guard Stand to Sit: 4: Min guard;To bed;To toilet   Exercises    Balance    End of Session OT - End of Session Equipment Utilized During Treatment: Right knee immobilizer;Gait belt Activity Tolerance: Patient tolerated treatment well Patient left: in bed;with family/visitor present;with call bell/phone within reach  GO     Evern Bio 11/07/2011, 2:29 PM [phone]

## 2011-11-07 NOTE — Progress Notes (Signed)
Physical Therapy Progress note.     11/07/11 1000  PT Visit Information  Last PT Received On 11/07/11  Assistance Needed +1  PT Time Calculation  PT Start Time 1040  PT Stop Time 1123  PT Time Calculation (min) 43 min  Subjective Data  Subjective I am going home today  Patient Stated Goal Walk without pain  Precautions  Precautions Knee  Required Braces or Orthoses Knee Immobilizer - Right  Knee Immobilizer - Right On except when in CPM  Restrictions  Weight Bearing Restrictions Yes  Cognition  Overall Cognitive Status Appears within functional limits for tasks assessed/performed  Arousal/Alertness Awake/alert  Orientation Level Appears intact for tasks assessed  Behavior During Session [facility] for tasks performed  Bed Mobility  Bed Mobility Supine to Sit  Supine to Sit 6: Modified independent (Device/Increase time)  Transfers  Transfers Sit to Stand;Stand to Sit  Sit to Stand 4: Min guard;From bed;From chair/3-in-1;With upper extremity assist  Stand to Sit 4: Min guard;To bed;To chair/3-in-1;With upper extremity assist  Stand Pivot Transfers 4: Min guard  Details for Transfer Assistance Pt did not require cueing for sit to stand. Did require cues for controlled descent for stand to sit.   Ambulation/Gait  Ambulation/Gait Assistance 5: Supervision  Ambulation Distance (Feet) 88 Feet  Assistive device Rolling walker  Ambulation/Gait Assistance Details Cues to increase gait speed.   Stairs Yes  Stairs Assistance 4: Min guard  Stairs Assistance Details (indicate cue type and reason) Cues for technique backwards with Bilateral rails.   Stair Management Technique Two rails;Backwards  Number of Stairs 4   PT - End of Session  Equipment Utilized During Treatment Gait belt;Right knee immobilizer  Activity Tolerance Patient tolerated treatment well;Patient limited by fatigue  Patient left in chair;with call bell/phone within reach  Nurse Communication Mobility status  PT -  Assessment/Plan  Comments on Treatment Session Pt mobility much improved. Pt wanting to discharge to home rather than SNF.    PT Plan Discharge plan needs to be updated;Frequency remains appropriate  PT Frequency 7X/week  Follow Up Recommendations Home health PT;Supervision - Intermittent  Equipment Recommended Defer to next venue  Acute Rehab PT Goals  PT Goal Formulation With patient  Time For Goal Achievement 11/09/11  Potential to Achieve Goals Good  Pt will go Supine/Side to Sit with supervision;with HOB 0 degrees  PT Goal: Supine/Side to Sit - Progress Met  Pt will go Sit to Supine/Side with supervision;with HOB 0 degrees  Pt will go Sit to Stand with supervision  PT Goal: Sit to Stand - Progress Met  Pt will go Stand to Sit with supervision  PT Goal: Stand to Sit - Progress Met  Pt will Transfer Bed to Chair/Chair to Bed with supervision  Pt will Ambulate 51 - 150 feet;with supervision;with rolling walker  PT Goal: Ambulate - Progress Met  Pt will Go Up / Down Stairs 3-5 stairs;with min assist;with rail(s)  PT Goal: Up/Down Stairs - Progress Progressing toward goal  Pt will Perform Home Exercise Program with supervision, verbal cues required/provided  PT General Charges  $$ ACUTE PT VISIT 1 Procedure  PT Treatments  $Gait Training 8-22 mins  $Therapeutic Activity 23-37 mins   Jacquelyne Quarry L. Kyona Chauncey DPT [phone]

## 2011-11-07 NOTE — Discharge Summary (Signed)
Physician Discharge Summary  Patient ID: [name] MRN: [id number] DOB/AGE: Nov 10, 1935 76 y.o.  Admit date: 11/01/2011 Discharge date: 11/07/2011  Admission Diagnoses:  Osteoarthritis of right knee  Discharge Diagnoses:  Principal Problem:  *Osteoarthritis of right knee Active Problems:  Obesity (BMI 30.0-34.9)  GERD (gastroesophageal reflux disease)  Hypertension   Past Medical History  Diagnosis Date  . Cholelithiasis 07/1996  . Hyperlipidemia 03/1999    takes Lipitor nightly  . NSVD (normal spontaneous vaginal delivery)     x 5  . Allergy 08/26/2002    [facility] hemoptysis actually allergic rhinitis  . CAD (coronary artery disease)     1 stent  . Peripheral edema     takes Furosemide daily  . Pneumonia     hx of--as a child  . Osteoarthritis 07/1996  . Joint pain   . Joint swelling   . Back pain     pt states from knee pain  . GERD (gastroesophageal reflux disease)     takes Prevacid daily  . Urinary frequency   . Depression     takes Zoloft daily  . Primary osteoarthritis of left knee 09/23/2010    Per Dr. Dion Saucier with [facility]   . Osteoarthritis of left knee 08/02/2011  . Hypertension 06/1999  . Osteoarthritis of right knee 11/01/2011  . Obesity (BMI 30.0-34.9) 06/21/2006    Qualifier: Diagnosis of  By: Hetty Ely MD, Franne Grip   . Kidney stones   . Left leg DVT 07/2010  . Exertional dyspnea 11/03/2011  . History of gout     Surgeries: Procedure(s): TOTAL KNEE ARTHROPLASTY on 11/01/2011   Consultants (if any):    Discharged Condition: Improved  Hospital Course: [name] is an 76 y.o. female who was admitted 11/01/2011 with a diagnosis of Osteoarthritis of right knee and went to the operating room on 11/01/2011 and underwent the above named procedures.  Postoperatively she had slow progress with physical therapy, and initially we were going to discharge her home, per her request, however she was not physically capable of being  independent enough to be at home on Friday, and remained throughout the weekend for further disposition planning, and she still is adamant that she wants to go home, and hopefully will be functional enough to do so. Otherwise she will need to go to skilled nursing later today. The therapy sessions today will hopefully be fruitful for her, and a final determination of disposition will be done after 12 today.  She was given perioperative antibiotics:  Anti-infectives     Start     Dose/Rate Route Frequency Ordered Stop   11/01/11 2000   vancomycin (VANCOCIN) IVPB 1000 mg/200 mL premix        1,000 mg 200 mL/hr over 60 Minutes Intravenous Every 8 hours 11/01/11 1747 11/02/11 0703   11/01/11 1145   vancomycin (VANCOCIN) IVPB 1000 mg/200 mL premix        1,000 mg 200 mL/hr over 60 Minutes Intravenous  Once 11/01/11 1134 11/01/11 1220   10/31/11 1443   ceFAZolin (ANCEF) IVPB 2 g/50 mL premix  Status:  Discontinued        2 g 100 mL/hr over 30 Minutes Intravenous 60 min pre-op 10/31/11 1443 11/01/11 1134        .  She was given sequential compression devices, early ambulation, and xarelto for DVT prophylaxis.  She benefited maximally from the hospital stay and there were no complications.    Recent vital signs:  Filed Vitals:  11/07/11 0635  BP: 128/60  Pulse: 84  Temp: 98.7 F (37.1 C)  Resp: 16    Recent laboratory studies:  Lab Results  Component Value Date   HGB 9.6* 11/04/2011   HGB 10.1* 11/03/2011   HGB 10.9* 11/02/2011   Lab Results  Component Value Date   WBC 12.6* 11/04/2011   PLT 220 11/04/2011   Lab Results  Component Value Date   INR 1.12 10/25/2011   Lab Results  Component Value Date   NA 134* 11/02/2011   K 3.5 11/02/2011   CL 97 11/02/2011   CO2 30 11/02/2011   BUN 3* 11/02/2011   CREATININE 0.43* 11/02/2011   GLUCOSE 135* 11/02/2011    Discharge Medications:   Medication List  As of 11/07/2011  8:12 AM   STOP taking these medications          acetaminophen 325 MG tablet      HYDROcodone-acetaminophen 10-325 MG per tablet         TAKE these medications         aspirin 81 MG tablet   Take 81 mg by mouth daily.      atenolol 25 MG tablet   Commonly known as: TENORMIN   Take 25 mg by mouth daily.      atorvastatin 80 MG tablet   Commonly known as: LIPITOR   Take 1 tablet (80 mg total) by mouth daily.      cholecalciferol 1000 UNITS tablet   Commonly known as: VITAMIN D   Take 1,000 Units by mouth 2 (two) times daily.      diazepam 5 MG tablet   Commonly known as: VALIUM   Take 0.5 tablets by mouth every 12 (twelve) hours.      diltiazem 240 MG 24 hr capsule   Commonly known as: DILACOR XR   Take 240 mg by mouth daily.      docusate sodium 100 MG capsule   Commonly known as: COLACE   Take 100 mg by mouth 2 (two) times daily.      furosemide 20 MG tablet   Commonly known as: LASIX   Take 20 mg by mouth daily.      lansoprazole 15 MG capsule   Commonly known as: PREVACID   Take 1 capsule (15 mg total) by mouth 2 (two) times daily.      methocarbamol 500 MG tablet   Commonly known as: ROBAXIN   Take 1 tablet (500 mg total) by mouth 4 (four) times daily.      oxyCODONE-acetaminophen 10-325 MG per tablet   Commonly known as: PERCOCET   Take 1-2 tablets by mouth every 6 (six) hours as needed for pain. MAXIMUM TOTAL ACETAMINOPHEN DOSE IS 4000 MG PER DAY      promethazine 25 MG tablet   Commonly known as: PHENERGAN   Take 1 tablet (25 mg total) by mouth every 6 (six) hours as needed for nausea.      rivaroxaban 10 MG Tabs tablet   Commonly known as: XARELTO   Take 1 tablet (10 mg total) by mouth daily. Take 10 mg by mouth daily.      sertraline 100 MG tablet   Commonly known as: ZOLOFT   Take 1 tablet (100 mg total) by mouth daily.      vitamin B-12 1000 MCG tablet   Commonly known as: CYANOCOBALAMIN   Take 1,000 mcg by mouth daily.      vitamin C 500 MG tablet   Commonly  known as: ASCORBIC ACID    Take 500 mg by mouth daily.            Diagnostic Studies: X-ray Knee Right Port  11/01/2011  *RADIOLOGY REPORT*  Clinical Data: 76 year old female status post right knee surgery.  PORTABLE RIGHT KNEE - 1-2 VIEW  Comparison: None.  Findings: Portable AP and cross-table lateral views.  Sequelae of total knee arthroplasty.  Hardware components appear intact and normally aligned.  Postoperative changes to the surrounding soft tissues including subcutaneous gas and suprapatellar joint effusion.  Dystrophic calcification in the lateral right five.  No unexpected osseous changes.  IMPRESSION: Postoperative changes from right total knee arthroplasty.   Original Report Authenticated By: Harley Hallmark, M.D.     Disposition: 03-Skilled Nursing Facility  Discharge Orders    Future Orders Please Complete By Expires   Diet general      Call MD / Call 911      Comments:   If you experience chest pain or shortness of breath, CALL 911 and be transported to the hospital emergency room.  If you develope a fever above 101 F, pus (white drainage) or increased drainage or redness at the wound, or calf pain, call your surgeon's office.   Discharge instructions      Comments:   Change dressing in 3 days and reapply fresh dressing, unless you have a splint (half cast).  If you have a splint/cast, just leave in place until your follow-up appointment.    Keep wounds dry for 3 weeks.  Leave steri-strips in place on skin.  Do not apply lotion or anything to the wound.   Constipation Prevention      Comments:   Drink plenty of fluids.  Prune juice may be helpful.  You may use a stool softener, such as Colace (over the counter) 100 mg twice a day.  Use MiraLax (over the counter) for constipation as needed.   TED hose      Comments:   Use stockings (TED hose) for 2 weeks on both leg(s).  You may remove them at night for sleeping.   Change dressing      Comments:   Change dressing in three days, then change the  dressing daily with sterile 4 x 4 inch gauze dressing.  You may clean the incision with alcohol prior to redressing.   Do not put a pillow under the knee. Place it under the heel.         Follow-up Information    Follow up with Shatima Zalar P, MD in 2 weeks.   Contact information:   [facility] [address] [place] [address] [phone]           Signed: Eulas Post 11/07/2011, 8:12 AM

## 2011-11-07 NOTE — Progress Notes (Signed)
Patient ID: [name], female   DOB: [birth year], 76 y.o.   MRN: [id number]     Subjective:  Patient reports pain as mild to moderate.  She states that she is doing much better, and that PT has been going well.  Objective:   VITALS:   Filed Vitals:   11/06/11 1600 11/06/11 2000 11/06/11 2124 11/07/11 0635  BP:   139/61 128/60  Pulse:   79 84  Temp:   99 F (37.2 C) 98.7 F (37.1 C)  TempSrc:   Oral Oral  Resp: 18 20 16 16   Height:      Weight:      SpO2: 95%  92% 94%    ABD soft Sensation intact distally Dorsiflexion/Plantar flexion intact Incision: dressing C/D/I and no drainage  LABS  No results found for this or any previous visit (from the past 24 hour(s)).  No results found.  Assessment/Plan: 6 Days Post-Op   Principal Problem:  *Osteoarthritis of right knee Active Problems:  Obesity (BMI 30.0-34.9)  GERD (gastroesophageal reflux disease)  Hypertension   Advance diet Up with therapy Patient is adamant she wants to go home. If passes PT if not will need SNF Placement, otherwise SNF later today.  Haskel Khan 11/07/2011, 8:03 AM   Teryl Lucy, MD Cell [phone] Pager [phone]

## 2011-11-07 NOTE — Progress Notes (Signed)
CARE MANAGEMENT NOTE 11/07/2011  Patient:  [name], [name]   Account Number:  [id number]  Date Initiated:  11/02/2011  Documentation initiated by:  STANFILL,BERTHA  Subjective/Objective Assessment:   POD#1 S/P RIGHT tka  plan to d/c home with family  d/c on Xarelto  has RW and BSC  CPM not ordered.     Action/Plan:   Home with HH services with [facility]   Anticipated DC Date:  11/07/2011   Anticipated DC Plan:  HOME W HOME HEALTH SERVICES  In-house referral  Clinical Social Worker      DC Associate Professor  CM consult      [facility] Choice  HOME HEALTH   Choice offered to / List presented to:          [facility] arranged  HH-2 PT  HH-6 SOCIAL WORKER      Status of service:  Completed, signed off Medicare Important Message given?   (If response is "NO", the following Medicare IM given date fields will be blank) Date Medicare IM given:   Date Additional Medicare IM given:    Discharge Disposition:  SKILLED NURSING FACILITY  Per UR Regulation:  Reviewed for med. necessity/level of care/duration of stay  If discussed at Long Length of Stay Meetings, dates discussed:

## 2011-11-07 NOTE — Progress Notes (Signed)
Physical Therapy Treatment Patient Details Name: [name] MRN: [id number] DOB: [birth year] Today's Date: 11/07/2011 Time: 4098-1191 PT Time Calculation (min): 12 min  PT Assessment / Plan / Recommendation Comments on Treatment Session  Pt husband present for session and both are satisfied that patient is ready to discharge to home with HHPT.     Follow Up Recommendations  Home health PT;Supervision - Intermittent    Barriers to Discharge        Equipment Recommendations  None recommended by PT    Recommendations for Other Services    Frequency 7X/week   Plan Discharge plan remains appropriate;Frequency remains appropriate    Precautions / Restrictions Precautions Precautions: Knee Required Braces or Orthoses: Knee Immobilizer - Right Knee Immobilizer - Right: On except when in CPM Restrictions Weight Bearing Restrictions: Yes RLE Weight Bearing: Weight bearing as tolerated   Pertinent Vitals/Pain Pt reporting pain 4/10 in Right knee. No pain intervention required.     Mobility  Bed Mobility Bed Mobility: Supine to Sit;Sit to Supine Supine to Sit: 5: Supervision Sit to Supine: 4: Min assist;HOB flat Details for Bed Mobility Assistance: Assist for R LE only. Transfers Transfers: Sit to Stand;Stand to Sit Sit to Stand: 5: Supervision Stand to Sit: 5: Supervision Stand Pivot Transfers: 5: Supervision Details for Transfer Assistance: No cueing or instruction required  Ambulation/Gait Ambulation/Gait Assistance: 6: Modified independent (Device/Increase time) Ambulation Distance (Feet): 80 Feet Assistive device: Rolling walker Ambulation/Gait Assistance Details: supervision for safety.  Gait Pattern: Step-to pattern;Antalgic;Decreased stance time - right;Decreased step length - left Gait velocity: decreased Stairs: Yes Stairs Assistance: 5: Supervision Stairs Assistance Details (indicate cue type and reason): Instructed pt and spouse in forward technque with  bilateral  rails. Stair Management Technique: Two rails;Forwards Number of Stairs: 4     Exercises     PT Diagnosis:    PT Problem List:   PT Treatment Interventions:     PT Goals Acute Rehab PT Goals PT Goal Formulation: With patient/family Time For Goal Achievement: 11/09/11 Potential to Achieve Goals: Good Pt will go Supine/Side to Sit: with supervision;with HOB 0 degrees PT Goal: Supine/Side to Sit - Progress: Met Pt will go Sit to Supine/Side: with supervision;with HOB 0 degrees PT Goal: Sit to Supine/Side - Progress: Met Pt will go Sit to Stand: with supervision PT Goal: Sit to Stand - Progress: Met Pt will go Stand to Sit: with supervision PT Goal: Stand to Sit - Progress: Met Pt will Transfer Bed to Chair/Chair to Bed: with supervision PT Transfer Goal: Bed to Chair/Chair to Bed - Progress: Met Pt will Ambulate: 51 - 150 feet;with supervision;with rolling walker PT Goal: Ambulate - Progress: Met Pt will Go Up / Down Stairs: 3-5 stairs;with min assist;with rail(s) PT Goal: Up/Down Stairs - Progress: Met Pt will Perform Home Exercise Program: with supervision, verbal cues required/provided  Visit Information  Last PT Received On: 11/07/11 Assistance Needed: +1    Subjective Data  Subjective: I am going home today Patient Stated Goal: Walk without pain   Cognition  Overall Cognitive Status: Appears within functional limits for tasks assessed/performed Area of Impairment: Memory Arousal/Alertness: Awake/alert Orientation Level: Appears intact for tasks assessed Behavior During Session: [facility] for tasks performed Memory Deficits: Pt writing down sequence for sit to stand, stand to sit.    Balance     End of Session PT - End of Session Equipment Utilized During Treatment: Gait belt;Right knee immobilizer Activity Tolerance: Patient tolerated treatment well;Patient limited by fatigue Patient  left: in chair;with call bell/phone within reach Nurse Communication:  Mobility status   GP     Dannielle Baskins 11/07/2011, 4:30 PM Rainelle Sulewski L. Aloysious Vangieson DPT [phone]

## 2011-11-13 ENCOUNTER — Encounter: Payer: Self-pay | Admitting: Internal Medicine

## 2011-11-29 ENCOUNTER — Other Ambulatory Visit: Payer: Self-pay | Admitting: *Deleted

## 2011-11-29 MED ORDER — LANSOPRAZOLE 15 MG PO CPDR: 15.0000 mg | DELAYED_RELEASE_CAPSULE | Freq: Two times a day (BID) | ORAL | Status: DC

## 2011-12-01 NOTE — Telephone Encounter (Signed)
Pt's husband spoke with someone about refill and did not understand; pt wanted to know status of refill. Lugene said was resolved and med sent to pharmacy. Pt understood and will pick up med.

## 2011-12-15 ENCOUNTER — Other Ambulatory Visit: Payer: Self-pay | Admitting: *Deleted

## 2011-12-15 MED ORDER — DIAZEPAM 5 MG PO TABS: 2.5000 mg | ORAL_TABLET | Freq: Two times a day (BID) | ORAL | Status: DC

## 2011-12-15 NOTE — Telephone Encounter (Signed)
Medication phoned to pharmacy.  

## 2011-12-15 NOTE — Telephone Encounter (Signed)
Please call in

## 2011-12-22 ENCOUNTER — Inpatient Hospital Stay (HOSPITAL_COMMUNITY)
Admission: RE | Admit: 2011-12-22 | Discharge: 2011-12-26 | DRG: 486 | Disposition: A | Discharge status: Skilled Nursing Facility | Payer: Medicare Other | Source: Ambulatory Visit | Attending: Orthopedic Surgery | Admitting: Orthopedic Surgery

## 2011-12-22 ENCOUNTER — Other Ambulatory Visit: Payer: Self-pay | Admitting: Orthopedic Surgery

## 2011-12-22 ENCOUNTER — Inpatient Hospital Stay (HOSPITAL_COMMUNITY): Admission: AD | Admit: 2011-12-22 | Payer: Medicare Other | Source: Ambulatory Visit | Admitting: Orthopedic Surgery

## 2011-12-22 ENCOUNTER — Inpatient Hospital Stay (HOSPITAL_COMMUNITY): Payer: Medicare Other

## 2011-12-22 ENCOUNTER — Encounter (HOSPITAL_COMMUNITY): Payer: Self-pay | Admitting: General Practice

## 2011-12-22 DIAGNOSIS — R7881 Bacteremia: Secondary | ICD-10-CM | POA: Diagnosis present

## 2011-12-22 DIAGNOSIS — T8453XA Infection and inflammatory reaction due to internal right knee prosthesis, initial encounter: Secondary | ICD-10-CM

## 2011-12-22 DIAGNOSIS — K219 Gastro-esophageal reflux disease without esophagitis: Secondary | ICD-10-CM | POA: Diagnosis present

## 2011-12-22 DIAGNOSIS — E669 Obesity, unspecified: Secondary | ICD-10-CM | POA: Diagnosis present

## 2011-12-22 DIAGNOSIS — T8450XA Infection and inflammatory reaction due to unspecified internal joint prosthesis, initial encounter: Principal | ICD-10-CM | POA: Diagnosis present

## 2011-12-22 DIAGNOSIS — Z9861 Coronary angioplasty status: Secondary | ICD-10-CM

## 2011-12-22 DIAGNOSIS — F341 Dysthymic disorder: Secondary | ICD-10-CM | POA: Diagnosis present

## 2011-12-22 DIAGNOSIS — Z96659 Presence of unspecified artificial knee joint: Secondary | ICD-10-CM

## 2011-12-22 DIAGNOSIS — Y849 Medical procedure, unspecified as the cause of abnormal reaction of the patient, or of later complication, without mention of misadventure at the time of the procedure: Secondary | ICD-10-CM | POA: Diagnosis present

## 2011-12-22 DIAGNOSIS — B950 Streptococcus, group A, as the cause of diseases classified elsewhere: Secondary | ICD-10-CM

## 2011-12-22 DIAGNOSIS — I2510 Atherosclerotic heart disease of native coronary artery without angina pectoris: Secondary | ICD-10-CM | POA: Diagnosis present

## 2011-12-22 DIAGNOSIS — A491 Streptococcal infection, unspecified site: Secondary | ICD-10-CM | POA: Diagnosis present

## 2011-12-22 DIAGNOSIS — B951 Streptococcus, group B, as the cause of diseases classified elsewhere: Secondary | ICD-10-CM | POA: Diagnosis present

## 2011-12-22 DIAGNOSIS — I10 Essential (primary) hypertension: Secondary | ICD-10-CM | POA: Diagnosis present

## 2011-12-22 DIAGNOSIS — I259 Chronic ischemic heart disease, unspecified: Secondary | ICD-10-CM | POA: Diagnosis present

## 2011-12-22 HISTORY — DX: Presence of unspecified artificial knee joint: Z96.659

## 2011-12-22 HISTORY — DX: Streptococcal infection, unspecified site: A49.1

## 2011-12-22 HISTORY — DX: Infection and inflammatory reaction due to internal right knee prosthesis, initial encounter: T84.53XA

## 2011-12-22 LAB — CBC
HCT: 37.9 % (ref 36.0–46.0)
HCT: 37.9 % (ref 36.0–46.0)
Hemoglobin: 12.3 g/dL (ref 12.0–15.0)
MCHC: 32.5 g/dL (ref 30.0–36.0)
MCHC: 33.2 g/dL (ref 30.0–36.0)
MCV: 78.5 fL (ref 78.0–100.0)
Platelets: 222 10*3/uL (ref 150–400)
RBC: 4.71 MIL/uL (ref 3.87–5.11)
RDW: 16.6 % — ABNORMAL HIGH (ref 11.5–15.5)
WBC: 12 10*3/uL — ABNORMAL HIGH (ref 4.0–10.5)

## 2011-12-22 LAB — BASIC METABOLIC PANEL
BUN: 5 mg/dL — ABNORMAL LOW (ref 6–23)
BUN: 4 mg/dL — ABNORMAL LOW (ref 6–23)
CO2: 25 mEq/L (ref 19–32)
Calcium: 9.5 mg/dL (ref 8.4–10.5)
Chloride: 96 mEq/L (ref 96–112)
Chloride: 97 mEq/L (ref 96–112)
Creatinine, Ser: 0.36 mg/dL — ABNORMAL LOW (ref 0.50–1.10)
GFR calc Af Amer: >90 mL/min (ref >90)
Glucose, Bld: 119 mg/dL — ABNORMAL HIGH (ref 70–99)
Potassium: 3.3 mEq/L — ABNORMAL LOW (ref 3.5–5.1)
Sodium: 135 mEq/L (ref 135–145)

## 2011-12-22 LAB — TYPE AND SCREEN: Antibody Screen: NEGATIVE

## 2011-12-22 LAB — SEDIMENTATION RATE: Sed Rate: 35 mm/hr — ABNORMAL HIGH (ref 0–22)

## 2011-12-22 LAB — URIC ACID: Uric Acid, Serum: 2.8 mg/dL (ref 2.4–7.0)

## 2011-12-22 MED ORDER — HYDROMORPHONE HCL PF 1 MG/ML IJ SOLN
0.5000 mg | INTRAMUSCULAR | Status: DC | PRN
  Administered 2011-12-23: 0.5 mg via INTRAVENOUS
  Filled 2011-12-22: qty 1

## 2011-12-22 MED ORDER — POTASSIUM CHLORIDE IN NACL 20-0.9 MEQ/L-% IV SOLN
INTRAVENOUS | Status: DC
  Administered 2011-12-22: via INTRAVENOUS
  Filled 2011-12-22 (×4): qty 1000

## 2011-12-22 MED ORDER — INFLUENZA VIRUS VACC SPLIT PF IM SUSP
0.5000 mL | INTRAMUSCULAR | Status: AC
  Administered 2011-12-23: 0.5 mL via INTRAMUSCULAR
  Filled 2011-12-22: qty 0.5

## 2011-12-22 MED ORDER — OXYCODONE-ACETAMINOPHEN 5-325 MG PO TABS
1.0000 | ORAL_TABLET | Freq: Four times a day (QID) | ORAL | Status: DC | PRN
  Administered 2011-12-22: 2 via ORAL
  Filled 2011-12-22: qty 2
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
## 2011-12-23 ENCOUNTER — Encounter (HOSPITAL_COMMUNITY): Payer: Self-pay | Admitting: Certified Registered"

## 2011-12-23 ENCOUNTER — Encounter (HOSPITAL_COMMUNITY): Payer: Self-pay | Admitting: Orthopedic Surgery

## 2011-12-23 ENCOUNTER — Inpatient Hospital Stay (HOSPITAL_COMMUNITY): Payer: Medicare Other | Admitting: Certified Registered"

## 2011-12-23 ENCOUNTER — Encounter (HOSPITAL_COMMUNITY)
Admission: RE | Disposition: A | Discharge status: Skilled Nursing Facility | Payer: Self-pay | Source: Ambulatory Visit | Attending: Orthopedic Surgery

## 2011-12-23 DIAGNOSIS — T8453XA Infection and inflammatory reaction due to internal right knee prosthesis, initial encounter: Secondary | ICD-10-CM

## 2011-12-23 HISTORY — PX: TOTAL KNEE REVISION: SHX996

## 2011-12-23 HISTORY — DX: Infection and inflammatory reaction due to internal right knee prosthesis, initial encounter: T84.53XA

## 2011-12-23 LAB — URINE MICROSCOPIC-ADD ON

## 2011-12-23 LAB — URINALYSIS, ROUTINE W REFLEX MICROSCOPIC
Hgb urine dipstick: NEGATIVE
Nitrite: NEGATIVE
Protein, ur: NEGATIVE mg/dL
Specific Gravity, Urine: 1.016 (ref 1.005–1.030)
Urobilinogen, UA: 1 mg/dL (ref 0.0–1.0)

## 2011-12-23 LAB — C-REACTIVE PROTEIN: CRP: 8.3 mg/dL — ABNORMAL HIGH (ref <0.60)

## 2011-12-23 SURGERY — TOTAL KNEE REVISION
Anesthesia: General | Site: Knee | Laterality: Right | Wound class: Dirty or Infected

## 2011-12-23 MED ORDER — MAGNESIUM CITRATE PO SOLN
1.0000 | Freq: Once | ORAL | Status: AC | PRN
  Filled 2011-12-23: qty 296

## 2011-12-23 MED ORDER — ASPIRIN 81 MG PO TABS: 81.0000 mg | ORAL_TABLET | Freq: Every day | ORAL | Status: DC

## 2011-12-23 MED ORDER — DOCUSATE SODIUM 100 MG PO CAPS
100.0000 mg | ORAL_CAPSULE | Freq: Two times a day (BID) | ORAL | Status: DC
  Administered 2011-12-23 – 2011-12-25 (×5): 100 mg via ORAL
  Filled 2011-12-23 (×7): qty 1

## 2011-12-23 MED ORDER — FUROSEMIDE 20 MG PO TABS
20.0000 mg | ORAL_TABLET | Freq: Every morning | ORAL | Status: DC
  Administered 2011-12-24 – 2011-12-26 (×3): 20 mg via ORAL
  Filled 2011-12-23 (×3): qty 1

## 2011-12-23 MED ORDER — ACETAMINOPHEN 325 MG PO TABS
650.0000 mg | ORAL_TABLET | Freq: Four times a day (QID) | ORAL | Status: DC | PRN
  Administered 2011-12-26 (×2): 650 mg via ORAL
  Filled 2011-12-23 (×3): qty 2

## 2011-12-23 MED ORDER — SORBITOL 70 % SOLN: 30.0000 mL | Freq: Every day | Status: DC | PRN

## 2011-12-23 MED ORDER — STERILE WATER FOR INJECTION IJ SOLN
Status: DC | PRN
  Administered 2011-12-23: 14:00:00 via INTRAVESICAL

## 2011-12-23 MED ORDER — VITAMIN C 500 MG PO TABS
500.0000 mg | ORAL_TABLET | Freq: Every day | ORAL | Status: DC
  Administered 2011-12-23 – 2011-12-26 (×4): 500 mg via ORAL
  Filled 2011-12-23 (×4): qty 1

## 2011-12-23 MED ORDER — WHITE PETROLATUM GEL
Status: AC
  Filled 2011-12-23: qty 5

## 2011-12-23 MED ORDER — SODIUM CHLORIDE 0.9 % IR SOLN
Status: DC | PRN
  Administered 2011-12-23: 3000 mL

## 2011-12-23 MED ORDER — HYDROGEN PEROXIDE 3 % EX SOLN
CUTANEOUS | Status: DC | PRN
  Administered 2011-12-23: 1

## 2011-12-23 MED ORDER — MENTHOL 3 MG MT LOZG: 1.0000 | LOZENGE | OROMUCOSAL | Status: DC | PRN

## 2011-12-23 MED ORDER — HYDROMORPHONE HCL PF 1 MG/ML IJ SOLN
0.5000 mg | INTRAMUSCULAR | Status: DC | PRN
  Administered 2011-12-23 – 2011-12-24 (×2): 0.5 mg via INTRAVENOUS
  Filled 2011-12-23 (×2): qty 1

## 2011-12-23 MED ORDER — VITAMIN B-12 1000 MCG PO TABS
1000.0000 ug | ORAL_TABLET | Freq: Every day | ORAL | Status: DC
  Administered 2011-12-23 – 2011-12-26 (×4): 1000 ug via ORAL
  Filled 2011-12-23 (×4): qty 1

## 2011-12-23 MED ORDER — METHOCARBAMOL 100 MG/ML IJ SOLN
500.0000 mg | Freq: Four times a day (QID) | INTRAMUSCULAR | Status: DC | PRN
  Administered 2011-12-23: 500 mg via INTRAVENOUS
  Filled 2011-12-23 (×2): qty 5

## 2011-12-23 MED ORDER — SUCCINYLCHOLINE CHLORIDE 20 MG/ML IJ SOLN
INTRAMUSCULAR | Status: DC | PRN
  Administered 2011-12-23: 100 mg via INTRAVENOUS

## 2011-12-23 MED ORDER — HYDROMORPHONE HCL PF 1 MG/ML IJ SOLN
INTRAMUSCULAR | Status: AC
  Filled 2011-12-23: qty 1

## 2011-12-23 MED ORDER — VANCOMYCIN HCL 1000 MG IV SOLR
1500.0000 mg | INTRAVENOUS | Status: DC
  Administered 2011-12-24: 1500 mg via INTRAVENOUS
  Filled 2011-12-23: qty 1500

## 2011-12-23 MED ORDER — SERTRALINE HCL 100 MG PO TABS
100.0000 mg | ORAL_TABLET | Freq: Every day | ORAL | Status: DC
  Administered 2011-12-23 – 2011-12-26 (×4): 100 mg via ORAL
  Filled 2011-12-23 (×4): qty 1

## 2011-12-23 MED ORDER — OXYCODONE HCL 5 MG PO TABS
5.0000 mg | ORAL_TABLET | ORAL | Status: DC | PRN
  Administered 2011-12-24 – 2011-12-25 (×3): 10 mg via ORAL
  Filled 2011-12-23 (×3): qty 2
  Filled 2011-12-23: qty 1

## 2011-12-23 MED ORDER — HYDROMORPHONE HCL PF 1 MG/ML IJ SOLN
0.2500 mg | INTRAMUSCULAR | Status: DC | PRN
  Administered 2011-12-23 (×2): 0.5 mg via INTRAVENOUS

## 2011-12-23 MED ORDER — COUMADIN BOOK
Freq: Once | Status: DC
  Filled 2011-12-23: qty 1

## 2011-12-23 MED ORDER — ASPIRIN EC 81 MG PO TBEC
81.0000 mg | DELAYED_RELEASE_TABLET | Freq: Every day | ORAL | Status: DC
  Administered 2011-12-23 – 2011-12-26 (×4): 81 mg via ORAL
  Filled 2011-12-23 (×4): qty 1

## 2011-12-23 MED ORDER — POTASSIUM CHLORIDE IN NACL 20-0.45 MEQ/L-% IV SOLN
INTRAVENOUS | Status: DC
Start: 2011-12-23 — End: 2011-12-26
  Administered 2011-12-23 – 2011-12-25 (×2): via INTRAVENOUS
  Filled 2011-12-23 (×8): qty 1000

## 2011-12-23 MED ORDER — FENTANYL CITRATE 0.05 MG/ML IJ SOLN
INTRAMUSCULAR | Status: DC | PRN
  Administered 2011-12-23 (×2): 50 ug via INTRAVENOUS
  Administered 2011-12-23: 150 ug via INTRAVENOUS

## 2011-12-23 MED ORDER — OXYCODONE HCL 5 MG/5ML PO SOLN: 5.0000 mg | Freq: Once | ORAL | Status: AC | PRN

## 2011-12-23 MED ORDER — WARFARIN - PHARMACIST DOSING INPATIENT
Freq: Every day | Status: DC
  Administered 2011-12-24: 18:00:00

## 2011-12-23 MED ORDER — ONDANSETRON HCL 4 MG/2ML IJ SOLN
INTRAMUSCULAR | Status: DC | PRN
  Administered 2011-12-23: 4 mg via INTRAVENOUS

## 2011-12-23 MED ORDER — BUPIVACAINE-EPINEPHRINE PF 0.5-1:200000 % IJ SOLN
INTRAMUSCULAR | Status: DC | PRN
  Administered 2011-12-23: 30 mL

## 2011-12-23 MED ORDER — LACTATED RINGERS IV SOLN
INTRAVENOUS | Status: DC | PRN
  Administered 2011-12-23 (×2): via INTRAVENOUS

## 2011-12-23 MED ORDER — ATORVASTATIN CALCIUM 80 MG PO TABS
80.0000 mg | ORAL_TABLET | Freq: Every day | ORAL | Status: DC
  Administered 2011-12-23 – 2011-12-25 (×3): 80 mg via ORAL
  Filled 2011-12-23 (×4): qty 1

## 2011-12-23 MED ORDER — ROCURONIUM BROMIDE 100 MG/10ML IV SOLN
INTRAVENOUS | Status: DC | PRN
  Administered 2011-12-23: 30 mg via INTRAVENOUS

## 2011-12-23 MED ORDER — FENTANYL CITRATE 0.05 MG/ML IJ SOLN
100.0000 ug | Freq: Once | INTRAMUSCULAR | Status: AC
  Administered 2011-12-23: 100 ug via INTRAVENOUS

## 2011-12-23 MED ORDER — DILTIAZEM HCL ER BEADS 240 MG PO CP24
240.0000 mg | ORAL_CAPSULE | Freq: Every day | ORAL | Status: DC
  Administered 2011-12-23 – 2011-12-26 (×4): 240 mg via ORAL
  Filled 2011-12-23 (×4): qty 1

## 2011-12-23 MED ORDER — METOCLOPRAMIDE HCL 5 MG/ML IJ SOLN
5.0000 mg | Freq: Three times a day (TID) | INTRAMUSCULAR | Status: DC | PRN
  Administered 2011-12-24 – 2011-12-25 (×2): 10 mg via INTRAVENOUS
  Filled 2011-12-23 (×2): qty 2

## 2011-12-23 MED ORDER — METHOCARBAMOL 500 MG PO TABS
500.0000 mg | ORAL_TABLET | Freq: Four times a day (QID) | ORAL | Status: DC | PRN
  Administered 2011-12-24 – 2011-12-25 (×3): 500 mg via ORAL
  Filled 2011-12-23 (×3): qty 1

## 2011-12-23 MED ORDER — WARFARIN SODIUM 7.5 MG PO TABS
7.5000 mg | ORAL_TABLET | Freq: Once | ORAL | Status: AC
  Administered 2011-12-24: 7.5 mg via ORAL
  Filled 2011-12-23: qty 1

## 2011-12-23 MED ORDER — POLYETHYLENE GLYCOL 3350 17 G PO PACK: 17.0000 g | PACK | Freq: Every day | ORAL | Status: DC | PRN

## 2011-12-23 MED ORDER — VANCOMYCIN HCL IN DEXTROSE 1-5 GM/200ML-% IV SOLN
INTRAVENOUS | Status: AC
  Filled 2011-12-23: qty 200

## 2011-12-23 MED ORDER — VANCOMYCIN HCL 1000 MG IV SOLR
1500.0000 mg | INTRAVENOUS | Status: DC
  Filled 2011-12-23: qty 1500

## 2011-12-23 MED ORDER — PROPOFOL 10 MG/ML IV BOLUS
INTRAVENOUS | Status: DC | PRN
  Administered 2011-12-23: 100 mg via INTRAVENOUS

## 2011-12-23 MED ORDER — ACETAMINOPHEN 650 MG RE SUPP: 650.0000 mg | Freq: Four times a day (QID) | RECTAL | Status: DC | PRN

## 2011-12-23 MED ORDER — DIAZEPAM 5 MG PO TABS: 2.5000 mg | ORAL_TABLET | Freq: Two times a day (BID) | ORAL | Status: DC | PRN

## 2011-12-23 MED ORDER — PANTOPRAZOLE SODIUM 40 MG PO TBEC
40.0000 mg | DELAYED_RELEASE_TABLET | Freq: Every day | ORAL | Status: DC
  Administered 2011-12-23 – 2011-12-26 (×4): 40 mg via ORAL
  Filled 2011-12-23 (×2): qty 1

## 2011-12-23 MED ORDER — METOCLOPRAMIDE HCL 10 MG PO TABS: 5.0000 mg | ORAL_TABLET | Freq: Three times a day (TID) | ORAL | Status: DC | PRN

## 2011-12-23 MED ORDER — ACETAMINOPHEN 10 MG/ML IV SOLN
1000.0000 mg | Freq: Four times a day (QID) | INTRAVENOUS | Status: AC
  Administered 2011-12-23 – 2011-12-24 (×3): 1000 mg via INTRAVENOUS
  Filled 2011-12-23 (×5): qty 100

## 2011-12-23 MED ORDER — ENOXAPARIN SODIUM 30 MG/0.3ML ~~LOC~~ SOLN
30.0000 mg | Freq: Two times a day (BID) | SUBCUTANEOUS | Status: DC
  Administered 2011-12-24 – 2011-12-25 (×3): 30 mg via SUBCUTANEOUS
  Filled 2011-12-23 (×6): qty 0.3

## 2011-12-23 MED ORDER — SENNA 8.6 MG PO TABS
1.0000 | ORAL_TABLET | Freq: Two times a day (BID) | ORAL | Status: DC
  Administered 2011-12-23 – 2011-12-25 (×5): 8.6 mg via ORAL
  Filled 2011-12-23 (×7): qty 1

## 2011-12-23 MED ORDER — WARFARIN VIDEO
Freq: Once | Status: AC
  Administered 2011-12-24: 10:00:00

## 2011-12-23 MED ORDER — ALUM & MAG HYDROXIDE-SIMETH 200-200-20 MG/5ML PO SUSP
30.0000 mL | ORAL | Status: DC | PRN
  Administered 2011-12-25: 30 mL via ORAL
  Filled 2011-12-23: qty 30

## 2011-12-23 MED ORDER — ATENOLOL 25 MG PO TABS
25.0000 mg | ORAL_TABLET | Freq: Every day | ORAL | Status: DC
  Administered 2011-12-23 – 2011-12-26 (×4): 25 mg via ORAL
  Filled 2011-12-23 (×4): qty 1

## 2011-12-23 MED ORDER — LIDOCAINE HCL (CARDIAC) 20 MG/ML IV SOLN
INTRAVENOUS | Status: DC | PRN
  Administered 2011-12-23: 80 mg via INTRAVENOUS

## 2011-12-23 MED ORDER — PHENOL 1.4 % MT LIQD: 1.0000 | OROMUCOSAL | Status: DC | PRN

## 2011-12-23 MED ORDER — ZOLPIDEM TARTRATE 5 MG PO TABS: 5.0000 mg | ORAL_TABLET | Freq: Every evening | ORAL | Status: DC | PRN

## 2011-12-23 MED ORDER — DOCUSATE SODIUM 100 MG PO CAPS: 100.0000 mg | ORAL_CAPSULE | Freq: Every day | ORAL | Status: DC

## 2011-12-23 MED ORDER — OXYCODONE HCL 5 MG PO TABS: 5.0000 mg | ORAL_TABLET | Freq: Once | ORAL | Status: AC | PRN

## 2011-12-23 MED ORDER — MEPERIDINE HCL 25 MG/ML IJ SOLN: 6.2500 mg | INTRAMUSCULAR | Status: DC | PRN

## 2011-12-23 MED ORDER — ONDANSETRON HCL 4 MG PO TABS: 4.0000 mg | ORAL_TABLET | Freq: Four times a day (QID) | ORAL | Status: DC | PRN

## 2011-12-23 MED ORDER — ONDANSETRON HCL 4 MG/2ML IJ SOLN
4.0000 mg | Freq: Four times a day (QID) | INTRAMUSCULAR | Status: DC | PRN
  Administered 2011-12-25: 4 mg via INTRAVENOUS

## 2011-12-23 MED ORDER — ONDANSETRON HCL 4 MG/2ML IJ SOLN: 4.0000 mg | Freq: Once | INTRAMUSCULAR | Status: AC | PRN

## 2011-12-23 MED ORDER — VANCOMYCIN HCL 1000 MG IV SOLR
1000.0000 mg | Freq: Once | INTRAVENOUS | Status: DC
  Filled 2011-12-23: qty 1000

## 2011-12-23 MED ORDER — VANCOMYCIN HCL IN DEXTROSE 1-5 GM/200ML-% IV SOLN
1000.0000 mg | Freq: Once | INTRAVENOUS | Status: AC
  Administered 2011-12-23: 1000 mg via INTRAVENOUS
  Filled 2011-12-23: qty 200

## 2011-12-23 MED ORDER — POTASSIUM CHLORIDE CRYS ER 20 MEQ PO TBCR
80.0000 meq | EXTENDED_RELEASE_TABLET | Freq: Once | ORAL | Status: AC
  Administered 2011-12-23: 80 meq via ORAL
  Filled 2011-12-23: qty 4

## 2011-12-23 MED ORDER — VITAMIN D3 25 MCG (1000 UNIT) PO TABS
1000.0000 [IU] | ORAL_TABLET | Freq: Two times a day (BID) | ORAL | Status: DC
  Administered 2011-12-23 – 2011-12-26 (×6): 1000 [IU] via ORAL
  Filled 2011-12-23 (×7): qty 1

## 2011-12-23 MED ORDER — MIDAZOLAM HCL 2 MG/2ML IJ SOLN
1.0000 mg | Freq: Once | INTRAMUSCULAR | Status: AC
  Administered 2011-12-23: 1 mg via INTRAVENOUS

## 2011-12-23 MED ORDER — RIFAMPIN 300 MG PO CAPS
300.0000 mg | ORAL_CAPSULE | Freq: Two times a day (BID) | ORAL | Status: DC
  Administered 2011-12-23 – 2011-12-24 (×2): 300 mg via ORAL
  Filled 2011-12-23 (×3): qty 1

## 2011-12-23 MED ORDER — DIPHENHYDRAMINE HCL 12.5 MG/5ML PO ELIX: 12.5000 mg | ORAL_SOLUTION | ORAL | Status: DC | PRN

## 2011-12-23 SURGICAL SUPPLY — 64 items
BANDAGE ELASTIC 4 VELCRO ST LF (GAUZE/BANDAGES/DRESSINGS) ×1 IMPLANT
BANDAGE ELASTIC 6 VELCRO ST LF (GAUZE/BANDAGES/DRESSINGS) ×1 IMPLANT
BANDAGE ESMARK 6X9 LF (GAUZE/BANDAGES/DRESSINGS) ×1 IMPLANT
BLADE SAG 18X100X1.27 (BLADE) ×2 IMPLANT
BLADE SURG 10 STRL SS (BLADE) ×2 IMPLANT
BNDG CMPR 9X6 STRL LF SNTH (GAUZE/BANDAGES/DRESSINGS) ×1
BNDG ESMARK 6X9 LF (GAUZE/BANDAGES/DRESSINGS) ×2
BOOTCOVER CLEANROOM LRG (PROTECTIVE WEAR) ×4 IMPLANT
BOWL SMART MIX CTS (DISPOSABLE) ×2 IMPLANT
CLOTH BEACON ORANGE TIMEOUT ST (SAFETY) ×2 IMPLANT
CONT SPEC 4OZ CLIKSEAL STRL BL (MISCELLANEOUS) ×1 IMPLANT
COVER SURGICAL LIGHT HANDLE (MISCELLANEOUS) ×2 IMPLANT
CUFF TOURNIQUET SINGLE 34IN LL (TOURNIQUET CUFF) ×1 IMPLANT
DRAPE EXTREMITY T 121X128X90 (DRAPE) ×2 IMPLANT
DRAPE PROXIMA HALF (DRAPES) ×2 IMPLANT
DRAPE U-SHAPE 47X51 STRL (DRAPES) ×2 IMPLANT
DRSG PAD ABDOMINAL 8X10 ST (GAUZE/BANDAGES/DRESSINGS) ×2 IMPLANT
DURAPREP 26ML APPLICATOR (WOUND CARE) ×2 IMPLANT
ELECT CAUTERY BLADE 6.4 (BLADE) ×2 IMPLANT
ELECT REM PT RETURN 9FT ADLT (ELECTROSURGICAL) ×2
ELECTRODE REM PT RTRN 9FT ADLT (ELECTROSURGICAL) ×1 IMPLANT
EVACUATOR 1/8 PVC DRAIN (DRAIN) IMPLANT
EVACUATOR 3/16  PVC DRAIN (DRAIN) ×1
EVACUATOR 3/16 PVC DRAIN (DRAIN) IMPLANT
GAUZE XEROFORM 5X9 LF (GAUZE/BANDAGES/DRESSINGS) ×2 IMPLANT
GLOVE BIOGEL PI IND STRL 8 (GLOVE) ×1 IMPLANT
GLOVE BIOGEL PI INDICATOR 8 (GLOVE) ×1
GLOVE ORTHO TXT STRL SZ7.5 (GLOVE) ×2 IMPLANT
GLOVE SURG ORTHO 8.0 STRL STRW (GLOVE) ×4 IMPLANT
GOWN STRL NON-REIN LRG LVL3 (GOWN DISPOSABLE) IMPLANT
HANDPIECE INTERPULSE COAX TIP (DISPOSABLE) ×2
HOOD SURGICAL BLUE (PROTECTIVE WEAR) ×2 IMPLANT
IMMOBILIZER KNEE 22 UNIV (SOFTGOODS) ×1 IMPLANT
INSERT CROSS LINKED SZ 4 10MM (Knees) ×1 IMPLANT
KIT BASIN OR (CUSTOM PROCEDURE TRAY) ×2 IMPLANT
KIT ROOM TURNOVER OR (KITS) ×2 IMPLANT
MANIFOLD NEPTUNE II (INSTRUMENTS) ×2 IMPLANT
NS IRRIG 1000ML POUR BTL (IV SOLUTION) ×2 IMPLANT
PACK TOTAL JOINT (CUSTOM PROCEDURE TRAY) ×2 IMPLANT
PAD ARMBOARD 7.5X6 YLW CONV (MISCELLANEOUS) ×4 IMPLANT
PAD CAST 4YDX4 CTTN HI CHSV (CAST SUPPLIES) ×1 IMPLANT
PADDING CAST COTTON 4X4 STRL (CAST SUPPLIES) ×2
PADDING CAST COTTON 6X4 STRL (CAST SUPPLIES) ×2 IMPLANT
SET HNDPC FAN SPRY TIP SCT (DISPOSABLE) ×1 IMPLANT
SPONGE GAUZE 4X4 12PLY (GAUZE/BANDAGES/DRESSINGS) ×2 IMPLANT
STAPLER VISISTAT 35W (STAPLE) ×2 IMPLANT
SUCTION FRAZIER TIP 10 FR DISP (SUCTIONS) ×2 IMPLANT
SUT PDS AB 1 CT  36 (SUTURE) ×2
SUT PDS AB 1 CT 36 (SUTURE) IMPLANT
SUT VIC AB 1 CTX 36 (SUTURE) ×4
SUT VIC AB 1 CTX36XBRD ANBCTR (SUTURE) ×2 IMPLANT
SUT VIC AB 2-0 SH 27 (SUTURE) ×4
SUT VIC AB 2-0 SH 27XBRD (SUTURE) ×2 IMPLANT
SUT VIC AB 3-0 SH 27 (SUTURE)
SUT VIC AB 3-0 SH 27X BRD (SUTURE) IMPLANT
SYR 30ML LL (SYRINGE) ×2 IMPLANT
SYR 30ML SLIP (SYRINGE) ×1 IMPLANT
TOWEL OR 17X24 6PK STRL BLUE (TOWEL DISPOSABLE) ×2 IMPLANT
TOWEL OR 17X26 10 PK STRL BLUE (TOWEL DISPOSABLE) ×2 IMPLANT
TRAY FOLEY CATH 14FR (SET/KITS/TRAYS/PACK) IMPLANT
TUBE ANAEROBIC SPECIMEN COL (MISCELLANEOUS) IMPLANT
TUBE CONNECTING 12X1/4 (SUCTIONS) ×2 IMPLANT
WATER STERILE IRR 1000ML POUR (IV SOLUTION) ×6 IMPLANT
YANKAUER SUCT BULB TIP NO VENT (SUCTIONS) ×1 IMPLANT

## 2011-12-23 NOTE — Progress Notes (Signed)
ANTIBIOTIC CONSULT NOTE - INITIAL ANTICOAGULATION CONSULT NOTE - Initial Consult  Pharmacy Consult for Coumadin and Vancomycin Indication: VTE prophylaxis and right knee infection  Allergies  Allergen Reactions  . Amoxicillin Swelling    "face, hands"  . Penicillins Swelling and Other (See Comments)    welts    Patient Measurements: Height: 5\' 6"  (167.6 cm) Weight: 186 lb 11.7 oz (84.7 kg) IBW/kg (Calculated) : 59.3    Vital Signs: Temp: 98.3 F (36.8 C) (10/11 2030) BP: 128/61 mmHg (10/11 2030) Pulse Rate: 73  (10/11 2030) Intake/Output from previous day:   Intake/Output from this shift: Total I/O In: -  Out: 250 [Urine:250]  Labs: Labs:  [facility] 12/22/11 2004 12/22/11 1723  HGB 12.6 12.3  HCT 37.9 37.9  PLT 222 213  APTT -- --  LABPROT 14.6 --  INR 1.16 --  HEPARINUNFRC -- --  CREATININE 0.36* 0.37*  CKTOTAL -- --  CKMB -- --  TROPONINI  -- --     Basename 12/22/11 2004 12/22/11 1723  WBC 12.0* 10.9*  HGB 12.6 12.3  PLT 222 213  LABCREA -- --  CREATININE 0.36* 0.37*   Estimated Creatinine Clearance: 68.7 ml/min (by C-G formula based on Cr of 0.36). No results found for this basename: VANCOTROUGH:2,VANCOPEAK:2,VANCORANDOM:2,GENTTROUGH:2,GENTPEAK:2,GENTRANDOM:2,TOBRATROUGH:2,TOBRAPEAK:2,TOBRARND:2,AMIKACINPEAK:2,AMIKACINTROU:2,AMIKACIN:2, in the last 72 hours   Microbiology: Recent Results (from the past 720 hour(s))  SURGICAL PCR SCREEN     Status: Normal   Collection Time   12/22/11  6:07 PM      Component Value Range Status Comment   MRSA, PCR NEGATIVE  NEGATIVE Final    Staphylococcus aureus NEGATIVE  NEGATIVE Final     Medical History: Past Medical History  Diagnosis Date  . Cholelithiasis 07/1996  . Hyperlipidemia 03/1999    takes Lipitor nightly  . NSVD (normal spontaneous vaginal delivery)     x 5  . Allergy 08/26/2002    [facility] hemoptysis actually allergic rhinitis  . CAD (coronary artery disease)     1 stent  .  Peripheral edema     takes Furosemide daily  . Pneumonia     hx of--as a child  . Osteoarthritis 07/1996  . Joint pain   . Joint swelling   . Back pain     pt states from knee pain  . GERD (gastroesophageal reflux disease)     takes Prevacid daily  . Urinary frequency   . Depression     takes Zoloft daily  . Primary osteoarthritis of left knee 09/23/2010    Per Dr. Dion Saucier with [facility]   . Osteoarthritis of left knee 08/02/2011  . Hypertension 06/1999  . Osteoarthritis of right knee 11/01/2011  . Obesity (BMI 30.0-34.9) 06/21/2006    Qualifier: Diagnosis of  By: Hetty Ely MD, Franne Grip   . Kidney stones   . Left leg DVT 07/2010  . Exertional dyspnea 11/03/2011  . History of gout   . Infection of total right knee replacement 12/23/2011    Medications:  Prescriptions prior to admission  Medication Sig Dispense Refill  . aspirin 81 MG tablet Take 81 mg by mouth daily.        Marland Kitchen atenolol (TENORMIN) 25 MG tablet Take 25 mg by mouth daily.      Marland Kitchen atorvastatin (LIPITOR) 80 MG tablet Take 1 tablet (80 mg total) by mouth daily.  90 tablet  3  . cholecalciferol (VITAMIN D) 1000 UNITS tablet Take 1,000 Units by mouth 2 (two) times daily.        Marland Kitchen  diazepam (VALIUM) 5 MG tablet Take 2.5 mg by mouth every 12 (twelve) hours as needed. For anxiety      . diltiazem (TIAZAC) 240 MG 24 hr capsule Take 240 mg by mouth daily.      Marland Kitchen docusate sodium (COLACE) 100 MG capsule Take 100 mg by mouth daily.       . furosemide (LASIX) 20 MG tablet Take 20 mg by mouth every morning.       Marland Kitchen HYDROcodone-acetaminophen (NORCO) 10-325 MG per tablet Take 2 tablets by mouth every 6 (six) hours as needed. For pain      . lansoprazole (PREVACID) 15 MG capsule Take 1 capsule (15 mg total) by mouth 2 (two) times daily.  60 capsule  11  . promethazine (PHENERGAN) 25 MG tablet Take 25 mg by mouth every 6 (six) hours as needed. For nausea      . sertraline (ZOLOFT) 100 MG tablet Take 1 tablet (100 mg total) by  mouth daily.  90 tablet  3  . vitamin B-12 (CYANOCOBALAMIN) 1000 MCG tablet Take 1,000 mcg by mouth daily.      . vitamin C (ASCORBIC ACID) 500 MG tablet Take 500 mg by mouth daily.       Scheduled:    . acetaminophen  1,000 mg Intravenous Q6H  . aspirin EC  81 mg Oral Daily  . atenolol  25 mg Oral Daily  . atorvastatin  80 mg Oral q1800  . cholecalciferol  1,000 Units Oral BID  . diltiazem  240 mg Oral Daily  . docusate sodium  100 mg Oral BID  . enoxaparin (LOVENOX) injection  30 mg Subcutaneous Q12H  . fentaNYL  100 mcg Intravenous Once  . furosemide  20 mg Oral q morning - 10a  . HYDROmorphone      . influenza  inactive virus vaccine  0.5 mL Intramuscular Tomorrow-1000  . midazolam  1 mg Intravenous Once  . pantoprazole  40 mg Oral Q1200  . potassium chloride  80 mEq Oral Once  . rifampin  300 mg Oral Q12H  . senna  1 tablet Oral BID  . sertraline  100 mg Oral Daily  . vancomycin  1,000 mg Intravenous Once  . vitamin B-12  1,000 mcg Oral Daily  . vitamin C  500 mg Oral Daily  . white petrolatum      . DISCONTD: aspirin  81 mg Oral Daily  . DISCONTD: docusate sodium  100 mg Oral Daily  . DISCONTD: vancomycin (VANCOCIN) 1000 mg IVPB  1,000 mg Intravenous Once   Assessment: 76 y.o female with right knee infection. S/p right TKR and I&D polyethylene exchange  Today. Previously had R. TKA 11/01/11.  She received Vancomycin 1g IV today @ 13:43.  Her Scr is 0.36, estimated Crcl ~60 ml/min  Yesterday INR = 1.16 , H/H 12.6/37.9, pltc 222K. Warfarin predictor point score is 4. On Rifampin which may decrease coumadin effect.   Goal of Therapy:  Vancomycin trough level 10-15 mcg/ml INR = 2-3  Plan:  Vancomycin 1500 mg IV q24h Coumadin 7.5 mg po today  INR daily.  Coumadin education book and video ordered.  Monitor renal function, culture results and vancomycin steady state trough as needed.   Noah Delaine, RPh Clinical Pharmacist Pager: [phone] 12/23/2011,9:09 PM

## 2011-12-23 NOTE — Anesthesia Preprocedure Evaluation (Addendum)
Anesthesia Evaluation  Patient identified by MRN, date of birth, ID band Patient awake    Reviewed: Allergy & Precautions, H&P , NPO status , Patient's Chart, lab work & pertinent test results, reviewed documented beta blocker date and time   Airway Mallampati: II TM Distance: >3 FB Neck ROM: Full    Dental   Pulmonary asthma ,          Cardiovascular hypertension, Pt. on medications + CAD     Neuro/Psych Depression    GI/Hepatic GERD-  ,  Endo/Other    Renal/GU      Musculoskeletal   Abdominal   Peds  Hematology   Anesthesia Other Findings   Reproductive/Obstetrics                           Anesthesia Physical Anesthesia Plan  ASA: III  Anesthesia Plan: General   Post-op Pain Management:    Induction: Intravenous  Airway Management Planned: Oral ETT  Additional Equipment:   Intra-op Plan:   Post-operative Plan: Extubation in OR  Informed Consent: I have reviewed the patients History and Physical, chart, labs and discussed the procedure including the risks, benefits and alternatives for the proposed anesthesia with the patient or authorized representative who has indicated his/her understanding and acceptance.     Plan Discussed with: CRNA and Surgeon  Anesthesia Plan Comments:         Anesthesia Quick Evaluation

## 2011-12-23 NOTE — Interval H&P Note (Signed)
History and Physical Interval Note:  12/23/2011 12:39 PM  [name]  has presented today for surgery, with the diagnosis of RIGHT KNEE INFECTION  The various methods of treatment have been discussed with the patient and family. After consideration of risks, benefits and other options for treatment, the patient has consented to  Procedure(s) (LRB) with comments: TOTAL KNEE REVISION (Right) with surgical debridement and polyethylene exchange as a surgical intervention .  The patient's history has been reviewed, patient examined, no change in status, stable for surgery.  I have reviewed the patient's chart and labs.  Questions were answered to the patient's satisfaction.     we discussed the risks for the need for revision surgery, the potential for removal of all implants and staged reimplantation, recurrent infection and osteomyelitis, among others and she is willing to proceed.  Reeve Turnley P

## 2011-12-23 NOTE — Progress Notes (Signed)
In to place PICC, Pt stated the she is tired and would like to rest tonight and have PICC placed 12/24/11.Pt stated that she has watched the PICC education video and understands the procedure.Will get consent in AM when patient is more alert. Pt has patent IV access. Will follow up tomorrow for PICC placement.

## 2011-12-23 NOTE — Anesthesia Postprocedure Evaluation (Signed)
Anesthesia Post Note  Patient: [name]  Procedure(s) Performed: Procedure(s) (LRB): TOTAL KNEE REVISION (Right)  Anesthesia type: general  Patient location: PACU  Post pain: Pain level controlled  Post assessment: Patient's Cardiovascular Status Stable  Last Vitals:  Filed Vitals:   12/23/11 1622  BP: 139/59  Pulse: 88  Temp: 37.1 C  Resp: 17    Post vital signs: Reviewed and stable  Level of consciousness: sedated  Complications: No apparent anesthesia complications

## 2011-12-23 NOTE — Transfer of Care (Signed)
Immediate Anesthesia Transfer of Care Note  Patient: [name]  Procedure(s) Performed: Procedure(s) (LRB) with comments: TOTAL KNEE REVISION (Right) - right total knee poly exchange with thorough multi method irrigation and debridement  Patient Location: PACU  Anesthesia Type: General  Level of Consciousness: awake, alert , oriented and patient cooperative  Airway & Oxygen Therapy: Patient Spontanous Breathing and Patient connected to nasal cannula oxygen  Post-op Assessment: Report given to PACU RN, Post -op Vital signs reviewed and stable and Patient moving all extremities  Post vital signs: Reviewed and stable  Complications: No apparent anesthesia complications

## 2011-12-23 NOTE — Anesthesia Procedure Notes (Addendum)
Anesthesia Regional Block:  Femoral nerve block  Pre-Anesthetic Checklist: ,, timeout performed, Correct Patient, Correct Site, Correct Laterality, Correct Procedure, Correct Position, site marked, Risks and benefits discussed,  Surgical consent,  Pre-op evaluation,  At surgeon's request and post-op pain management  Laterality: Right  Prep: chloraprep       Needles:  Injection technique: Single-shot  Needle Type: Echogenic Stimulator Needle          Additional Needles:  Procedures: ultrasound guided and nerve stimulator Femoral nerve block  Nerve Stimulator or Paresthesia:  Response: 0.4 mA,   Additional Responses:   Narrative:  Start time: 12/23/2011 12:15 PM End time: 12/23/2011 12:30 PM Injection made incrementally with aspirations every 5 mL.  Performed by: Personally  Anesthesiologist: Arta Bruce MD  Additional Notes: Monitors applied. Patient sedated. Sterile prep and drape,hand hygiene and sterile gloves were used. Relevant anatomy identified.Needle position confirmed.Local anesthetic injected incrementally after negative aspiration. Local anesthetic spread visualized around nerve(s). Vascular puncture avoided. No complications. Image printed for medical record.The patient tolerated the procedure well.        Procedure Name: Intubation Date/Time: 12/23/2011 12:59 PM Performed by: Jerilee Hoh Pre-anesthesia Checklist: Patient identified, Emergency Drugs available, Patient being monitored and Suction available Patient Re-evaluated:Patient Re-evaluated prior to inductionOxygen Delivery Method: Circle system utilized Preoxygenation: Pre-oxygenation with 100% oxygen Intubation Type: IV induction, Cricoid Pressure applied and Rapid sequence Laryngoscope Size: Mac and 3 Grade View: Grade I Tube type: Oral Tube size: 7.5 mm Number of attempts: 1 Airway Equipment and Method: Stylet Placement Confirmation: ETT inserted through vocal cords under direct vision,   positive ETCO2 and breath sounds checked- equal and bilateral Secured at: 21 cm Tube secured with: Tape Dental Injury: Teeth and Oropharynx as per pre-operative assessment
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
## 2011-12-23 NOTE — Op Note (Signed)
12/22/2011 - 12/23/2011  2:33 PM  PATIENT:  [name]    PRE-OPERATIVE DIAGNOSIS:  RIGHT PROSTHETIC TOTAL KNEE INFECTION  POST-OPERATIVE DIAGNOSIS:  Same  PROCEDURE:  Irrigation and debridement of right total knee infection with complete synovectomy and polyethylene exchange  SURGEON:  Eulas Post, MD  PHYSICIAN ASSISTANT: Janace Litten, OPA-C, present and scrubbed throughout the case, critical for completion in a timely fashion, and for retraction, instrumentation, and closure.  ANESTHESIA:   General  PREOPERATIVE INDICATIONS:  [name] is a  76 y.o. female with a diagnosis of RIGHT KNEE INFECTION who elected for surgical management.  She had a total knee replacement done approximately 2 months ago, and presented with chills, and excruciating right knee pain to the office on Thursday afternoon. Knee aspiration was performed, which demonstrated 34,000 white blood cells. She was not on antibiotics. She had some warmth and redness at the distal portion of her incision. She was admitted to the hospital, and then brought to surgery the following day.  The risks benefits and alternatives were discussed with the patient preoperatively including but not limited to the risks of infection, bleeding, nerve injury, cardiopulmonary complications, the need for revision surgery, among others, and the patient was willing to proceed. We also discussed the risks of recurrent infection, the need for cement interposition arthroplasty, the potential frank dictation, cardiopulmonary complications, among others.  OPERATIVE IMPLANTS: [employer] P[employer] Sigma fixed-bearing polyethylene size 4 x 10 mm  OPERATIVE FINDINGS: Necrotic synovium, no evidence for infection of the bone, no loosening of the prosthetic parts.  OPERATIVE PROCEDURE: The patient is brought to the operating room and placed in supine position. General anesthesia was administered. Regional block and given. Time out was performed. The right  lower extremity was examined, and had a flexion contracture but brought about 20. I manipulated her knee, and regain full motion. The right lower extremity was then prepped and draped in usual sterile fashion. The leg was elevated and exsanguinated and tourniquet was inflated. I aspirated the knee and drew a total of about 30 cc of bloody fluid, which was sent to the lab for Gram stain culture and sensitivity.  Incision was made through her prior incision, and carried down to the parapatellar tissue. There is no pus in this region. I opened the joint through the previous parapatellar incision, and exposed the undersurface of the patella. There was substantial purulence and necrosis of synovial tissue particularly posterior to the patellar tendon, as well as diffusely around the suprapatellar pouch. All of this was excised using sharp dissection and complete synovectomy was performed. I used combination of rongeurs as well as curettes to debride the posterior compartment. All necrotic appearing tissue was removed. I also removed the polyethylene insert.  After complete debridement, I irrigated with a total of 3 L of saline, and then I poured in a mix of peroxide 3% with Betadine solution mixed within saline. This was then removed, and then I irrigated another 3 L of fluid, and then used clorpectin 2 g powder and 1 L of sterile water and rinsed this through the knee 3 times, letting it sit for 1 minute at a time each time. It is theoretically should reduce the risk of bile film development on the prosthesis.  I then irrigated the knee one last time with another 3 L of fluid. I reexamined all of the compartments, and it all appeared to be very clean. I then replaced the polyethylene.  The knee had full motion, and  good stability, and the parapatellar incision was closed with Prolene, followed by Vicryl for the subcutaneous tissue with staples for the skin. Deep large drains were placed. Sterile gauze was  applied. The tourniquet was released. Total tourniquet time was approximately one hour and 15 minutes. She tolerated the procedure well and there were no complications. Obliquely we will be able to clear her infection with the one stage polyethylene exchange. We are going to get infectious disease involved, and also get a PICC line and begin with IV vancomycin until we can optimize regimen based on cultures.

## 2011-12-23 NOTE — Anesthesia Postprocedure Evaluation (Signed)
  Anesthesia Post-op Note  Patient: [name]  Procedure(s) Performed: Procedure(s) (LRB) with comments: TOTAL KNEE REVISION (Right) - right total knee poly exchange with thorough multi method irrigation and debridement  Patient Location: PACU  Anesthesia Type: General  Level of Consciousness: awake, alert  and oriented  Airway and Oxygen Therapy: Patient Spontanous Breathing and Patient connected to nasal cannula oxygen  Post-op Pain: none  Post-op Assessment: Post-op Vital signs reviewed, Patient's Cardiovascular Status Stable, Respiratory Function Stable, Patent Airway and No signs of Nausea or vomiting  Post-op Vital Signs: Reviewed and stable  Complications: No apparent anesthesia complications

## 2011-12-23 NOTE — Preoperative (Signed)
Beta Blockers   Reason not to administer Beta Blockers:Not Applicable 

## 2011-12-24 ENCOUNTER — Inpatient Hospital Stay (HOSPITAL_COMMUNITY): Payer: Medicare Other

## 2011-12-24 LAB — BASIC METABOLIC PANEL
CO2: 27 mEq/L (ref 19–32)
Calcium: 8.4 mg/dL (ref 8.4–10.5)
Glucose, Bld: 122 mg/dL — ABNORMAL HIGH (ref 70–99)
Sodium: 137 mEq/L (ref 135–145)

## 2011-12-24 LAB — PROTIME-INR: Prothrombin Time: 15.9 seconds — ABNORMAL HIGH (ref 11.6–15.2)

## 2011-12-24 LAB — CBC
Hemoglobin: 10.6 g/dL — ABNORMAL LOW (ref 12.0–15.0)
MCH: 25.9 pg — ABNORMAL LOW (ref 26.0–34.0)
RBC: 4.1 MIL/uL (ref 3.87–5.11)

## 2011-12-24 MED ORDER — VANCOMYCIN HCL IN DEXTROSE 1-5 GM/200ML-% IV SOLN
1000.0000 mg | Freq: Two times a day (BID) | INTRAVENOUS | Status: DC
  Administered 2011-12-24 – 2011-12-26 (×4): 1000 mg via INTRAVENOUS
  Filled 2011-12-24 (×5): qty 200

## 2011-12-24 MED ORDER — WARFARIN SODIUM 7.5 MG PO TABS
7.5000 mg | ORAL_TABLET | Freq: Once | ORAL | Status: AC
  Administered 2011-12-24: 7.5 mg via ORAL
  Filled 2011-12-24: qty 1

## 2011-12-24 MED ORDER — SODIUM CHLORIDE 0.9 % IJ SOLN
10.0000 mL | INTRAMUSCULAR | Status: DC | PRN
  Administered 2011-12-25: 20 mL
  Administered 2011-12-26 (×2): 10 mL

## 2011-12-24 MED ORDER — RIFAMPIN 300 MG PO CAPS
300.0000 mg | ORAL_CAPSULE | Freq: Two times a day (BID) | ORAL | Status: DC
  Administered 2011-12-24 – 2011-12-26 (×4): 300 mg via ORAL
  Filled 2011-12-24 (×6): qty 1

## 2011-12-24 NOTE — Progress Notes (Signed)
Received call regarding abnormal lab report regarding patient's right knee wound culture; Group B, Strep as per lab. Physician on-call paged. Awaiting return call. Will endorse.

## 2011-12-24 NOTE — Evaluation (Addendum)
Occupational Therapy Evaluation Patient Details Name: [name] MRN: [id number] DOB: 1935-11-16 Today's Date: 12/24/2011 Time: 5956-3875 OT Time Calculation (min): 29 min  OT Assessment / Plan / Recommendation Clinical Impression  Pt is s/p Irrigation and debridement of right total knee infection and revision. She displays decreased balance and functional mobility/ADL. Will benefit from skilled OT services to improve ADL independence for next venue of care.     OT Assessment  Patient needs continued OT Services    Follow Up Recommendations  Skilled nursing facility    Barriers to Discharge      Equipment Recommendations  None recommended by PT    Recommendations for Other Services    Frequency  Min 2X/week    Precautions / Restrictions Precautions Precautions: Knee Required Braces or Orthoses: Knee Immobilizer - Right Knee Immobilizer - Right: Discontinue post op day 2 Restrictions Weight Bearing Restrictions: No Other Position/Activity Restrictions: WBAT        ADL  Eating/Feeding: Simulated;Independent Where Assessed - Eating/Feeding: Chair Grooming: Simulated;Wash/dry hands;Set up Where Assessed - Grooming: Supported sitting Upper Body Bathing: Performed;Chest;Right arm;Left arm;Abdomen;Set up;Supervision/safety Where Assessed - Upper Body Bathing: Unsupported sitting Lower Body Bathing: Simulated;Moderate assistance;Other (comment) (pt currently unsteady to let go of RW to wash periareas) Where Assessed - Lower Body Bathing: Supported sit to stand Upper Body Dressing: Simulated;Supervision/safety;Set up Where Assessed - Upper Body Dressing: Unsupported sitting Lower Body Dressing: Simulated;Moderate assistance Where Assessed - Lower Body Dressing: Supported sit to stand Toilet Transfer: Mining engineer Method: Sit to stand Toileting - Clothing Manipulation and Hygiene: Simulated;Maximal assistance;Other (comment) (pt unsteady to  let go of RW currently) Where Assessed - Toileting Clothing Manipulation and Hygiene: Sit to stand from 3-in-1 or toilet Tub/Shower Transfer Method: Not assessed Equipment Used: Rolling walker ADL Comments: Pt had vomitted just prior to OT arrival. Informed nursing. No more nausea reported during session. Worked on transition movement with sit to stand and stand to sit for bathing task.     OT Diagnosis: Generalized weakness  OT Problem List: Decreased strength;Decreased knowledge of use of DME or AE;Impaired balance (sitting and/or standing) OT Treatment Interventions: Self-care/ADL training;Therapeutic activities;DME and/or AE instruction;Patient/family education   OT Goals Acute Rehab OT Goals OT Goal Formulation: With patient Time For Goal Achievement: 12/31/11 Potential to Achieve Goals: Good ADL Goals Pt Will Perform Grooming: with min assist;Standing at sink ADL Goal: Grooming - Progress: Goal set today Pt Will Perform Lower Body Bathing: with min assist;Sit to stand from chair;Sit to stand from bed ADL Goal: Lower Body Bathing - Progress: Goal set today Pt Will Perform Lower Body Dressing: with min assist;Sit to stand from chair;Sit to stand from bed ADL Goal: Lower Body Dressing - Progress: Goal set today Pt Will Transfer to Toilet: with min assist;Ambulation;with DME;3-in-1 ADL Goal: Toilet Transfer - Progress: Goal set today Pt Will Perform Toileting - Clothing Manipulation: with min assist;Standing ADL Goal: Toileting - Clothing Manipulation - Progress: Goal set today  Visit Information  Last OT Received On: 12/24/11 Assistance Needed: +1    Subjective Data  Subjective: I would like to get a bath Patient Stated Goal: to go to rehab and be more independent.   Prior Functioning     Home Living Lives With: Spouse Available Help at Discharge: Skilled Nursing Facility Prior Function Level of Independence: Needs assistance Needs Assistance: Bathing;Meal Prep;Light  Housekeeping Bath: Minimal Meal Prep: Total Light Housekeeping: Total         Vision/Perception  Cognition  Overall Cognitive Status: Appears within functional limits for tasks assessed/performed Arousal/Alertness: Awake/alert Orientation Level: Appears intact for tasks assessed Behavior During Session: [facility] for tasks performed    Extremity/Trunk Assessment Right Upper Extremity Assessment RUE ROM/Strength/Tone: [facility] for tasks assessed Left Upper Extremity Assessment LUE ROM/Strength/Tone: [facility] for tasks assessed Right Lower Extremity Assessment RLE ROM/Strength/Tone: Deficits;Due to pain;Unable to fully assess;Due to precautions Left Lower Extremity Assessment LLE ROM/Strength/Tone: [facility] for tasks assessed Trunk Assessment Trunk Assessment: Normal     Mobility Bed Mobility Bed Mobility: Supine to Sit;Sitting - Scoot to Edge of Bed Supine to Sit: 4: Min assist;HOB elevated Sitting - Scoot to [place] of Bed: 4: Min assist;With rail Details for Bed Mobility Assistance: Min assist for RLE Transfers Transfers: Sit to Stand;Stand to Sit Sit to Stand: 4: Min assist;With upper extremity assist;From chair/3-in-1 Stand to Sit: 4: Min assist;With upper extremity assist;To chair/3-in-1 Details for Transfer Assistance: VC's for hand placement and upright posture; VC's with controlled movement for sitting     Shoulder Instructions     Exercise     Balance Balance Balance Assessed: Yes High Level Balance High Level Balance Activites: Backward walking;Turns;Side stepping High Level Balance Comments: Pt req guard secondary to instability   End of Session OT - End of Session Activity Tolerance: Patient tolerated treatment well Patient left: in chair;with call bell/phone within reach  GO     Lennox Laity [phone] 12/24/2011, 3:55 PM

## 2011-12-24 NOTE — Progress Notes (Addendum)
ANTIBIOTIC CONSULT NOTE - Follow up ANTICOAGULATION CONSULT NOTE - Follow up Consult  Pharmacy Consult for Coumadin and Vancomycin Indication: VTE prophylaxis and right knee infection  Allergies  Allergen Reactions  . Amoxicillin Swelling    "face, hands"  . Penicillins Swelling and Other (See Comments)    welts    Patient Measurements: Height: 5\' 6"  (167.6 cm) Weight: 186 lb 11.7 oz (84.7 kg) IBW/kg (Calculated) : 59.3    Vital Signs: Temp: 97.9 F (36.6 C) (10/12 0526) BP: 109/61 mmHg (10/12 0526) Pulse Rate: 60  (10/12 0526) Intake/Output from previous day: 10/11 0701 - 10/12 0700 In: 2250 [P.O.:240; I.V.:2010] Out: 2435 [Urine:2225; Drains:110; Blood:100] Intake/Output from this shift:    Labs: Labs:  [facility] 12/22/11 2004 12/22/11 1723  HGB 12.6 12.3  HCT 37.9 37.9  PLT 222 213  APTT -- --  LABPROT 14.6 --  INR 1.16 --  HEPARINUNFRC -- --  CREATININE 0.36* 0.37*  CKTOTAL -- --  CKMB -- --  TROPONINI  -- --     Basename 12/24/11 0634 12/22/11 2004 12/22/11 1723  WBC 11.3* 12.0* 10.9*  HGB 10.6* 12.6 12.3  PLT 186 222 213  LABCREA -- -- --  CREATININE 0.31* 0.36* 0.37*   Estimated Creatinine Clearance: 68.7 ml/min (by C-G formula based on Cr of 0.31). No results found for this basename: VANCOTROUGH:2,VANCOPEAK:2,VANCORANDOM:2,GENTTROUGH:2,GENTPEAK:2,GENTRANDOM:2,TOBRATROUGH:2,TOBRAPEAK:2,TOBRARND:2,AMIKACINPEAK:2,AMIKACINTROU:2,AMIKACIN:2, in the last 72 hours   Microbiology: Recent Results (from the past 720 hour(s))  SURGICAL PCR SCREEN     Status: Normal   Collection Time   12/22/11  6:07 PM      Component Value Range Status Comment   MRSA, PCR NEGATIVE  NEGATIVE Final    Staphylococcus aureus NEGATIVE  NEGATIVE Final   BODY FLUID CULTURE     Status: Normal (Preliminary result)   Collection Time   12/23/11  1:29 PM      Component Value Range Status Comment   Specimen Description FLUID SYNOVIAL RIGHT KNEE   Final    Special Requests NONE    Final    Gram Stain     Final    Value: FEW WBC PRESENT,BOTH PMN AND MONONUCLEAR     NO ORGANISMS SEEN   Culture PENDING   Incomplete    Report Status PENDING   Incomplete   TISSUE CULTURE     Status: Normal (Preliminary result)   Collection Time   12/23/11  1:33 PM      Component Value Range Status Comment   Specimen Description TISSUE KNEE RIGHT   Final    Special Requests SYNOVIUM   Final    Gram Stain PENDING   Incomplete    Culture NO GROWTH 1 DAY   Final    Report Status PENDING   Incomplete   WOUND CULTURE     Status: Normal (Preliminary result)   Collection Time   12/23/11  1:35 PM      Component Value Range Status Comment   Specimen Description WOUND KNEE RIGHT   Final    Special Requests RT KNEE CAPSULE   Final    Gram Stain PENDING   Incomplete    Culture NO GROWTH 1 DAY   Final    Report Status PENDING   Incomplete     Assessment: 76 yo female with right knee infection s/p right TKR and I&D polyethylene exchange on 10/11. Previously had R TKA 11/01/11. Vancomycin Day #2 - Afebrile, WBC 10.9>12.0>11.3. Tissue cx, wound cx, synovial fluid ngtd/pending. SCr 0.31, CrCl ~69 ml/min. Last  dose given at midnight (q24h regimen).   Pt on Coumadin for VTE prophylaxis. Baseline INR of 1.16. INR today is subtherapeutic (1.30) after one dose of 7.5 mg. Also on Rifampin, which may decrease coumadin effect, started 12/23/11. Small drop in hgb s/p surgery, plt ok. Of note also on Lovenox until INR >/= 1.8.   Goal of Therapy:  Vancomycin trough level 10-15 mcg/ml INR = 2-3  Plan:  Repeat Coumadin 7.5 mg po x1 today  INR daily Increase vancomycin to 1000 mg IV q12h starting at 2200 Monitor renal function, culture results and vancomycin steady state trough as needed.     Nicolasa Ducking, PharmD Clinical Pharmacist Pgr [phone] 12/24/2011,8:07 AM
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
## 2011-12-24 NOTE — Progress Notes (Signed)
Patient ID: [name], female   DOB: 06/28/1935, 76 y.o.   MRN: [id number] PATIENT ID: [name]  MRN: [id number]  DOB/AGE:  1935/10/24 / 76 y.o.  1 Day Post-Op Procedure(s) (LRB): TOTAL KNEE REVISION (Right)    PROGRESS NOTE Subjective: Patient is alert, oriented, very sick looking,  no Nausea, no Vomiting, yes passing gas, no Bowel Movement. Taking PO minimal. Denies SOB, Chest or Calf Pain. Using Incentive Spirometer, PAS in place. Ambulate not yet, CPM  Patient reports pain as 8 on 0-10 scale  .    Objective: Vital signs in last 24 hours: Filed Vitals:   12/23/11 2045 12/24/11 0200 12/24/11 0526 12/24/11 0800  BP:  109/56 109/61   Pulse:  55 60   Temp:  98.2 F (36.8 C) 97.9 F (36.6 C)   Resp:  16 16 18   Height: 5\' 6"  (1.676 m)     Weight: 84.7 kg (186 lb 11.7 oz)     SpO2:  100% 99% 99%      Intake/Output from previous day: I/O last 3 completed shifts: In: 2250 [P.O.:240; I.V.:2010] Out: 2435 [Urine:2225; Drains:110; Blood:100]   Intake/Output this shift:     LABORATORY DATA:  Basename 12/24/11 0634 12/22/11 2004  WBC 11.3* 12.0*  HGB 10.6* 12.6  HCT 33.0* 37.9  PLT 186 222  NA 137 134*  K 3.8 3.0*  CL 103 97  CO2 27 25  BUN 4* 4*  CREATININE 0.31* 0.36*  GLUCOSE 122* 146*  GLUCAP -- --  INR 1.30 1.16  CALCIUM 8.4 --    Examination: ABD soft Neurovascular intact Sensation intact distally Incision: dressing C/D/I} Pulses not palpable but foot is warm and brisk cap refill  Assessment:   1 Day Post-Op Procedure(s) (LRB): TOTAL KNEE REVISION (Right) ADDITIONAL DIAGNOSIS:  Principal Problem:  *Infection of total right knee replacement  Patient Active Problem List  Diagnosis  . UNSPECIFIED VITAMIN D DEFICIENCY  . DISORDER, CARBOHYDRATE METABOLISM NOS  . HYPERLIPIDEMIA  . Obesity (BMI 30.0-34.9)  . DEPRESSION/ANXIETY  . HYPERTENSION  . CORONARY ARTERY DISEASE, S/P PTCA  . ALLERGIC ASTHMA  . GERD  . Dysuria  . Colon cancer screening   . Osteopenia  . Primary osteoarthritis of left knee  . Hand pain  . Back pain  . Osteoarthritis of left knee  . Osteoarthritis of right knee  . GERD (gastroesophageal reflux disease)  . Hypertension  . Infection of total right knee replacement     Plan: PT/OT WBAT, on Vancomycine and rifampin DVT Prophylaxis:  SCD on left leg, Lovenox BID  D/C to SNF on Monday.      [name] J 12/24/2011, 9:23 AM

## 2011-12-24 NOTE — Evaluation (Signed)
Physical Therapy Evaluation Patient Details Name: [name] MRN: [id number] DOB: 16-Oct-1935 Today's Date: 12/24/2011 Time: 4098-1191 PT Time Calculation (min): 28 min  PT Assessment / Plan / Recommendation Clinical Impression  Pt is pleasent 76 y.o. female s/p I&D of right knee with revision.  Pt demostrates deficits in functional mobility secondary to pain, weakness, limited ROM, and decreased activity tolerance. Will benefit from skilled PT to address deficits.  Rec SNF upon discharge.    PT Assessment  Patient needs continued PT services    Follow Up Recommendations  Post acute inpatient    Does the patient have the potential to tolerate intense rehabilitation   No, Recommend SNF  Barriers to Discharge Inaccessible home environment      Equipment Recommendations  None recommended by PT    Recommendations for Other Services     Frequency 7X/week    Precautions / Restrictions Precautions Precautions: Knee Required Braces or Orthoses: Knee Immobilizer - Right Knee Immobilizer - Right: Discontinue post op day 2 Restrictions Weight Bearing Restrictions: No Other Position/Activity Restrictions: WBAT   Pertinent Vitals/Pain 6/10      Mobility  Bed Mobility Bed Mobility: Supine to Sit;Sitting - Scoot to Edge of Bed Supine to Sit: 4: Min assist;HOB elevated Sitting - Scoot to [place] of Bed: 4: Min assist;With rail Details for Bed Mobility Assistance: Min assist for RLE Transfers Transfers: Sit to Stand;Stand to Sit Sit to Stand: 4: Min assist;With upper extremity assist;From chair/3-in-1 Stand to Sit: 4: Min assist;With upper extremity assist;To chair/3-in-1 Details for Transfer Assistance: VC's for hand placement and upright posture; VC's with controlled movement for sitting Ambulation/Gait Ambulation/Gait Assistance: 4: Min guard;4: Min Environmental consultant (Feet): 8 Feet Assistive device: Rolling walker Ambulation/Gait Assistance Details: Min assist for  instability and to advance rw initially; VC's for sequence Gait Pattern: Step-to pattern;Decreased stance time - right;Decreased stride length;Shuffle;Antalgic Gait velocity: decreased General Gait Details: Pt unsteady with ambulation Stairs: No Wheelchair Mobility Wheelchair Mobility: No           PT Diagnosis: Difficulty walking;Abnormality of gait;Generalized weakness;Acute pain  PT Problem List: Decreased strength;Decreased range of motion;Decreased activity tolerance;Decreased balance;Decreased mobility;Decreased knowledge of use of DME;Pain PT Treatment Interventions: Gait training;DME instruction;Stair training;Functional mobility training;Therapeutic activities;Therapeutic exercise;Patient/family education   PT Goals Acute Rehab PT Goals PT Goal Formulation: With patient Time For Goal Achievement: 12/28/11 Potential to Achieve Goals: Good Pt will Stand: with modified independence PT Goal: Stand - Progress: Goal set today Pt will Ambulate: >150 feet;with modified independence;with rolling walker PT Goal: Ambulate - Progress: Goal set today Pt will Go Up / Down Stairs: 3-5 stairs;with min assist;with rolling walker PT Goal: Up/Down Stairs - Progress: Goal set today Pt will Perform Home Exercise Program: with supervision, verbal cues required/provided PT Goal: Perform Home Exercise Program - Progress: Goal set today  Visit Information  Last PT Received On: 12/24/11 Assistance Needed: +1    Subjective Data  Subjective: I really need to use the bathroom Patient Stated Goal: to be able to walk   Prior Functioning  Home Living Lives With: Spouse Available Help at Discharge: Skilled Nursing Facility Prior Function Level of Independence: Needs assistance Needs Assistance: Bathing;Meal Prep;Light Housekeeping Bath: Minimal Meal Prep: Total Light Housekeeping: Total    Cognition  Overall Cognitive Status: Appears within functional limits for tasks  assessed/performed Arousal/Alertness: Awake/alert Orientation Level: Appears intact for tasks assessed Behavior During Session: [facility] for tasks performed    Extremity/Trunk Assessment Right Upper Extremity Assessment RUE ROM/Strength/Tone: [facility]  for tasks assessed Left Upper Extremity Assessment LUE ROM/Strength/Tone: [facility] for tasks assessed Right Lower Extremity Assessment RLE ROM/Strength/Tone: Deficits;Due to pain;Unable to fully assess;Due to precautions Left Lower Extremity Assessment LLE ROM/Strength/Tone: [facility] for tasks assessed Trunk Assessment Trunk Assessment: Normal   Balance Balance Balance Assessed: Yes High Level Balance High Level Balance Activites: Backward walking;Turns;Side stepping High Level Balance Comments: Pt req guard secondary to instability  End of Session PT - End of Session Equipment Utilized During Treatment: Gait belt;Right knee immobilizer Activity Tolerance: Patient limited by fatigue;Patient limited by pain Patient left: in chair;with call bell/phone within reach Nurse Communication: Mobility status  GP     Fabio Asa 12/24/2011, 3:00 PM Charlotte Crumb, PT DPT  [phone]

## 2011-12-24 NOTE — Progress Notes (Signed)
Orthopedic Tech Progress Note Patient Details:  [name] 06-01-1935 [id number] Verification of Ortho orders; patient already has knee immobilizer Patient ID: [name], female   DOB: 01/16/1936, 76 y.o.   MRN: [id number]   [name] 12/24/2011, 11:44 AM

## 2011-12-24 NOTE — Progress Notes (Signed)
Peripherally Inserted Central Catheter/Midline Placement  The IV Nurse has discussed with the patient and/or persons authorized to consent for the patient, the purpose of this procedure and the potential benefits and risks involved with this procedure.  The benefits include less needle sticks, lab draws from the catheter and patient may be discharged home with the catheter.  Risks include, but not limited to, infection, bleeding, blood clot (thrombus formation), and puncture of an artery; nerve damage and irregular heat beat.  Alternatives to this procedure were also discussed.  PICC/Midline Placement Documentation  PICC / Midline Single Lumen 12/24/11 PICC Left Basilic (Active)       [name], [name] 12/24/2011, 11:21 AM

## 2011-12-25 ENCOUNTER — Encounter (HOSPITAL_COMMUNITY): Payer: Self-pay | Admitting: Physician Assistant

## 2011-12-25 DIAGNOSIS — A491 Streptococcal infection, unspecified site: Secondary | ICD-10-CM

## 2011-12-25 DIAGNOSIS — Z96659 Presence of unspecified artificial knee joint: Secondary | ICD-10-CM

## 2011-12-25 HISTORY — DX: Presence of unspecified artificial knee joint: Z96.659

## 2011-12-25 HISTORY — DX: Streptococcal infection, unspecified site: A49.1

## 2011-12-25 LAB — BODY FLUID CULTURE

## 2011-12-25 LAB — CBC
HCT: 29.3 % — ABNORMAL LOW (ref 36.0–46.0)
Hemoglobin: 9.3 g/dL — ABNORMAL LOW (ref 12.0–15.0)
MCHC: 31.7 g/dL (ref 30.0–36.0)
RBC: 3.64 MIL/uL — ABNORMAL LOW (ref 3.87–5.11)
WBC: 9.1 10*3/uL (ref 4.0–10.5)

## 2011-12-25 LAB — PROTIME-INR
INR: 3.03 — ABNORMAL HIGH (ref 0.00–1.49)
Prothrombin Time: 31.4 seconds — ABNORMAL HIGH (ref 11.6–15.2)

## 2011-12-25 MED ORDER — LIDOCAINE HCL (PF) 1 % IJ SOLN
INTRAMUSCULAR | Status: AC
  Filled 2011-12-25: qty 5

## 2011-12-25 MED ORDER — BISACODYL 10 MG RE SUPP
10.0000 mg | Freq: Once | RECTAL | Status: DC
  Filled 2011-12-25: qty 1

## 2011-12-25 MED ORDER — ONDANSETRON HCL 4 MG/2ML IJ SOLN
INTRAMUSCULAR | Status: AC
  Filled 2011-12-25: qty 2

## 2011-12-25 NOTE — Progress Notes (Signed)
MD, please address the order for cardiac monitoring that was placed from PACU.

## 2011-12-25 NOTE — Progress Notes (Signed)
Physical Therapy Treatment Patient Details Name: [name] MRN: [id number] DOB: [birth year] Today's Date: 12/25/2011 Time: 7829-5621 PT Time Calculation (min): 33 min  PT Assessment / Plan / Recommendation Comments on Treatment Session  Pt with c/o pain limiting further ther ex today.  Ice provided to R LE.  Pt progressing with mobility.    Follow Up Recommendations  Post acute inpatient     Does the patient have the potential to tolerate intense rehabilitation  No, Recommend SNF  Barriers to Discharge        Equipment Recommendations  None recommended by PT    Recommendations for Other Services    Frequency 7X/week   Plan Discharge plan remains appropriate;Frequency remains appropriate    Precautions / Restrictions Precautions Precautions: Knee Restrictions Weight Bearing Restrictions: No Other Position/Activity Restrictions: WBAT   Pertinent Vitals/Pain Pt c/o R knee pain but did not rate.  Ice provided.    Mobility  Bed Mobility Bed Mobility: Sit to Supine Sit to Supine: 4: Min assist;HOB flat Details for Bed Mobility Assistance: Min assist for RLE Transfers Transfers: Sit to Stand;Stand to Sit Sit to Stand: 4: Min assist;With upper extremity assist;With armrests;From chair/3-in-1 Stand to Sit: 4: Min assist;With upper extremity assist;To bed Details for Transfer Assistance: verbal cues for hand placement and to weight shift over BOS.  Cues to control descent in sitting.  Needs physical assist to stand from recliner or 3in1. Ambulation/Gait Ambulation/Gait Assistance: 4: Min assist Ambulation Distance (Feet): 12 Feet Assistive device: Rolling walker Ambulation/Gait Assistance Details: cues for sequence and min assist to steady at times Gait Pattern: Step-to pattern;Decreased stance time - right;Decreased stride length;Shuffle;Antalgic Gait velocity: decreased Stairs: No Wheelchair Mobility Wheelchair Mobility: No    Exercises Total Joint Exercises Ankle  Circles/Pumps: AROM;Both;10 reps Quad Sets: AROM;Right;10 reps;Supine Heel Slides: AAROM;Right;10 reps;Supine Hip ABduction/ADduction: AAROM;Right;5 reps;Supine     PT Goals Acute Rehab PT Goals PT Goal: Stand - Progress: Progressing toward goal PT Goal: Ambulate - Progress: Progressing toward goal PT Goal: Perform Home Exercise Program - Progress: Progressing toward goal  Visit Information  Last PT Received On: 12/25/11 Assistance Needed: +1    Subjective Data  Subjective: I think I need to go to the bathroom again   Cognition  Overall Cognitive Status: Appears within functional limits for tasks assessed/performed Arousal/Alertness: Awake/alert Orientation Level: Appears intact for tasks assessed Behavior During Session: [facility] for tasks performed    Balance  Balance Balance Assessed: No  End of Session PT - End of Session Equipment Utilized During Treatment: Gait belt Activity Tolerance: Patient limited by fatigue;Patient limited by pain Patient left: in bed;with call bell/phone within reach Nurse Communication: Mobility status       [name] 12/25/2011, 12:48 PM  [name], PTA Acute Rehab [phone] (office)

## 2011-12-25 NOTE — Progress Notes (Addendum)
ANTIBIOTIC CONSULT NOTE - Follow up ANTICOAGULATION CONSULT NOTE - Follow up Consult  Pharmacy Consult for Coumadin and Vancomycin Indication: VTE prophylaxis and right knee infection  Allergies  Allergen Reactions  . Amoxicillin Swelling    "face, hands"  . Penicillins Swelling and Other (See Comments)    welts    Patient Measurements: Height: 5\' 6"  (167.6 cm) Weight: 186 lb 11.7 oz (84.7 kg) IBW/kg (Calculated) : 59.3    Vital Signs:   Intake/Output from previous day: 10/12 0701 - 10/13 0700 In: -  Out: 400 [Urine:300; Drains:100] Intake/Output from this shift:    Labs: Labs:  [facility] 12/22/11 2004 12/22/11 1723  HGB 12.6 12.3  HCT 37.9 37.9  PLT 222 213  APTT -- --  LABPROT 14.6 --  INR 1.16 --  HEPARINUNFRC -- --  CREATININE 0.36* 0.37*  CKTOTAL -- --  CKMB -- --  TROPONINI  -- --     Basename 12/25/11 0633 12/24/11 0634 12/22/11 2004 12/22/11 1723  WBC 9.1 11.3* 12.0* --  HGB 9.3* 10.6* 12.6 --  PLT 222 186 222 --  LABCREA -- -- -- --  CREATININE -- 0.31* 0.36* 0.37*   Estimated Creatinine Clearance: 68.7 ml/min (by C-G formula based on Cr of 0.31). No results found for this basename: VANCOTROUGH:2,VANCOPEAK:2,VANCORANDOM:2,GENTTROUGH:2,GENTPEAK:2,GENTRANDOM:2,TOBRATROUGH:2,TOBRAPEAK:2,TOBRARND:2,AMIKACINPEAK:2,AMIKACINTROU:2,AMIKACIN:2, in the last 72 hours   Microbiology: Recent Results (from the past 720 hour(s))  SURGICAL PCR SCREEN     Status: Normal   Collection Time   12/22/11  6:07 PM      Component Value Range Status Comment   MRSA, PCR NEGATIVE  NEGATIVE Final    Staphylococcus aureus NEGATIVE  NEGATIVE Final   BODY FLUID CULTURE     Status: Normal (Preliminary result)   Collection Time   12/23/11  1:29 PM      Component Value Range Status Comment   Specimen Description FLUID SYNOVIAL RIGHT KNEE   Final    Special Requests NONE   Final    Gram Stain     Final    Value: FEW WBC PRESENT,BOTH PMN AND MONONUCLEAR     NO ORGANISMS  SEEN   Culture PENDING   Incomplete    Report Status PENDING   Incomplete   TISSUE CULTURE     Status: Normal (Preliminary result)   Collection Time   12/23/11  1:33 PM      Component Value Range Status Comment   Specimen Description TISSUE KNEE RIGHT   Final    Special Requests SYNOVIUM   Final    Gram Stain PENDING   Incomplete    Culture NO GROWTH 1 DAY   Final    Report Status PENDING   Incomplete   WOUND CULTURE     Status: Normal (Preliminary result)   Collection Time   12/23/11  1:35 PM      Component Value Range Status Comment   Specimen Description WOUND KNEE RIGHT   Final    Special Requests RT KNEE CAPSULE   Final    Gram Stain PENDING   Incomplete    Culture NO GROWTH 1 DAY   Final    Report Status PENDING   Incomplete     Assessment: 76 yo female with right knee infection s/p right TKR and I&D polyethylene exchange on 10/11. Previously had R TKA 11/01/11. To continue on Coumadin for VTE prophylaxis. Baseline INR of 1.16. INR today is borderline supratherapeutic (3.03) after two doses of 7.5 mg. Also on Rifampin, which may decrease coumadin effect,  started 12/23/11. Larger than expected INR increase, likely have not seen full effect of doses. Some issues obtaining labs this morning, repeat INR of 3.25 remains supratherapeutic. Some drop in Hgb, plt stable. Of note also on Lovenox until INR >/= 1.8, will d/c this per order instructions. No bleeding noted per RN.   Goal of Therapy:  Vancomycin trough level 10-15 mcg/ml INR = 2-3  Plan:  Hold Coumadin today INR daily D/c Lovenox for INR >1.8    [name], PharmD Clinical Pharmacist Pgr [phone] 12/25/2011,11:12 AM

## 2011-12-25 NOTE — Progress Notes (Signed)
[facility]    Date of Admission:  12/22/2011   Total days of antibiotics 3        Day 3 vanco        Day 3 rifampin           ID: [name] is a 76 y.o. female  with group B streptococcus early prosthetic joint infection of TKA s/p IXD and polyexchange on vancomycin and rifampin, sed rate 35, CRP 8.3  Principal Problem:  *Infection of total right knee replacement    Subjective: Less pain in knee. Afebrile.  Allergies  Allergen Reactions  . Amoxicillin Swelling    "face, hands"  . Penicillins Swelling and Other (See Comments)    welts     Medications:     . acetaminophen  1,000 mg Intravenous Q6H  . aspirin EC  81 mg Oral Daily  . atenolol  25 mg Oral Daily  . atorvastatin  80 mg Oral q1800  . bisacodyl  10 mg Rectal Once  . cholecalciferol  1,000 Units Oral BID  . coumadin book   Does not apply Once  . diltiazem  240 mg Oral Daily  . docusate sodium  100 mg Oral BID  . furosemide  20 mg Oral q morning - 10a  . ondansetron      . pantoprazole  40 mg Oral Q1200  . rifampin  300 mg Oral BID PC  . senna  1 tablet Oral BID  . sertraline  100 mg Oral Daily  . vancomycin  1,000 mg Intravenous Q12H  . vitamin B-12  1,000 mcg Oral Daily  . vitamin C  500 mg Oral Daily  . warfarin  7.5 mg Oral ONCE-1800  . Warfarin - Pharmacist Dosing Inpatient   Does not apply q1800  . DISCONTD: enoxaparin (LOVENOX) injection  30 mg Subcutaneous Q12H  . DISCONTD: rifampin  300 mg Oral Q12H    Objective: Vital signs in last 24 hours: Temp:  [98.1 F (36.7 C)-98.5 F (36.9 C)] 98.5 F (36.9 C) (10/12 2006) Pulse Rate:  [60-73] 73  (10/12 2006) Resp:  [16-20] 20  (10/13 0800) BP: (82-108)/(36-63) 108/63 mmHg (10/12 2006) SpO2:  [98 %-99 %] 99 % (10/13 0800) gen = a xo by 3 in NAD Cors = nl s1,s2, no g/m/r Ext= right knee wrapped, accordian drain in place   Lab Results  Basename 12/25/11 0633 12/24/11 0634 12/22/11 2004  WBC 9.1 11.3* --    HGB 9.3* 10.6* --  HCT 29.3* 33.0* --  NA -- 137 134*  K -- 3.8 3.0*  CL -- 103 97  CO2 -- 27 25  BUN -- 4* 4*  CREATININE -- 0.31* 0.36*  GLU -- -- --   Sedimentation Rate  Basename 12/22/11 1723  ESRSEDRATE 35*   C-Reactive Protein  Basename 12/22/11 1723  CRP 8.3*    Microbiology:  Studies/Results: Dg Chest Port 1 View  12/24/2011  *RADIOLOGY REPORT*  Clinical Data: PICC line placement  PORTABLE CHEST - 1 VIEW  Comparison: 12/22/2011  Findings: Mild cardiomegaly stable.  Left PICC line has been placed with tip projecting over the distal superior vena cava.  No pneumothorax.  Lungs are clear.  IMPRESSION: Left PICC line placement.   Original Report Authenticated By: Otilio Carpen, M.D.      Assessment/Plan: 1) Group B streptococcus prosthetic joint infection = recommend to treat with IV vancomycin (goal trough of 15-20) and rifampin 300mg  BID for 6  wks, then we will switch to an oral regimen for a total of 6 months.  - while on vancomycin, please ask home health to get weekly cbc, cmp, vanco trough - can call ID clinic if she develops rash to antibiotic  - due to her penicillin allergy being consistent with anaphylaxis, will not change her to more targeted therapy for GBStrep.  - can have picc line placed today/tomorrow  2) will get baseline LFTs given that she is on rifampin  3) if she has nausea with rifampin, can add low dose anti-emetic.  Will arrange for follow up visit in [facility] clinic in 5-6 wks. [phone]  [facility] Infectious Diseases Cell: [phone] Pager: [phone]  12/25/2011, 11:26 AM

## 2011-12-25 NOTE — Clinical Social Work Note (Addendum)
Clinical Social Work Department CLINICAL SOCIAL WORK PLACEMENT NOTE 12/25/2011  Patient:  RIYANSHI, WAHAB  Account Number:  [id number] Admit date:  12/22/2011  Clinical Social Worker:  Skip Mayer  Date/time:  12/25/2011 10:30 AM  Clinical Social Work is seeking post-discharge placement for this patient at the following level of care:   SKILLED NURSING   (*CSW will update this form in Epic as items are completed)   12/25/2011  Patient/family provided with [facility] Department of Clinical Social Work's list of facilities offering this level of care within the geographic area requested by the patient (or if unable, by the patient's family).  12/25/2011  Patient/family informed of their freedom to choose among providers that offer the needed level of care, that participate in Medicare, Medicaid or managed care program needed by the patient, have an available bed and are willing to accept the patient.  12/25/2011  Patient/family informed of [facility]' ownership interest in [facility], as well as of the fact that they are under no obligation to receive care at this facility.  PASARR submitted to EDS on EXISTING PASARR # PASARR number received from EDS on   FL2 transmitted to all facilities in geographic area requested by pt/family on  12/25/2011 FL2 transmitted to all facilities within larger geographic area on   Patient informed that his/her managed care company has contracts with or will negotiate with  certain facilities, including the following:     Patient/family informed of bed offers received:  12/26/11 Patient chooses bed at [facility] Physician recommends and patient chooses bed at    Patient to be transferred to [facility]  on  12/26/11 Patient to be transferred to facility by EMS  The following physician request were entered in Epic:   Additional Comments: Patient is pleased with d/c plan. Notified her husband of d/c plan. Notified SNF and  pt's nurse- [place] of d/c.  Lorri Frederick. [place]  [phone]

## 2011-12-25 NOTE — Progress Notes (Signed)
Patient ID: ADALYNE LOVICK, female   DOB: 11-18-1935, 76 y.o.   MRN: [id number] PATIENT ID: BENTLIE WITHEM  MRN: [id number]  DOB/AGE:  1935-12-12 / 76 y.o.  2 Days Post-Op Procedure(s) (LRB): TOTAL KNEE REVISION (Right)    PROGRESS NOTE Subjective: Patient is alert, oriented, no Nausea, no Vomiting, yes passing gas, no Bowel Movement. Taking PO well. Denies SOB, Chest or Calf Pain. Using Incentive Spirometer, PAS in place. Ambulate well 1+assist,  Patient reports pain as 4 on 0-10 scale  .    Objective: Vital signs in last 24 hours: Filed Vitals:   12/24/11 1200 12/24/11 1425 12/24/11 2006 12/25/11 0800  BP:  82/36 108/63   Pulse:  60 73   Temp:  98.1 F (36.7 C) 98.5 F (36.9 C)   TempSrc:  Oral Oral   Resp: 18 16 16 20   Height:      Weight:      SpO2: 99% 98% 99% 99%      Intake/Output from previous day: I/O last 3 completed shifts: In: 1150 [P.O.:240; I.V.:910] Out: 1850 [Urine:1650; Drains:200]   Intake/Output this shift:     LABORATORY DATA:  Basename 12/25/11 0633 12/24/11 0634 12/22/11 2004  WBC 9.1 11.3* --  HGB 9.3* 10.6* --  HCT 29.3* 33.0* --  PLT 222 186 --  NA -- 137 134*  K -- 3.8 3.0*  CL -- 103 97  CO2 -- 27 25  BUN -- 4* 4*  CREATININE -- 0.31* 0.36*  GLUCOSE -- 122* 146*  GLUCAP -- -- --  INR 3.03* 1.30 --  CALCIUM -- 8.4 --   Verbal call report of Group A Strep from knee culture  Examination: ABD soft Neurovascular intact Sensation intact distally Intact pulses distally} Post tib pulse intact by doppler Wound well approximated.  Drains removed today.  Assessment:   2 Days Post-Op Procedure(s) (LRB): TOTAL KNEE REVISION (Right) ADDITIONAL DIAGNOSIS:  Principal Problem:  *Infection of total right knee replacement Group A Strep Active Problems:  Obesity (BMI 30.0-34.9)  DEPRESSION/ANXIETY  HYPERTENSION  CORONARY ARTERY DISEASE, S/P PTCA  GERD  Group A streptococcal infection  Status post right total knee  replacement   Plan: PT/OT WBAT,  DVT Prophylaxis:  SCDx72hrs,  DISCHARGE PLAN: Skilled Nursing Facility/Rehab DISCHARGE NEEDS: IV Antibiotics Dressing changed today.  Drains removed today    Enes Wegener J 12/25/2011, 11:34 AM

## 2011-12-25 NOTE — Clinical Social Work Note (Signed)
Clinical Social Work Department BRIEF PSYCHOSOCIAL ASSESSMENT 12/25/2011  Patient:  [name], [name]     Account Number:  [id number]     Admit date:  12/22/2011  Clinical Social Worker:  Skip Mayer  Date/Time:  12/25/2011 10:30 AM  Referred by:  Physician  Date Referred:  12/25/2011 Referred for  SNF Placement   Other Referral:   Interview type:  Patient Other interview type:    PSYCHOSOCIAL DATA Living Status:  FAMILY Admitted from facility:   Level of care:   Primary support name:  Greggory Stallion Primary support relationship to patient:  SPOUSE Degree of support available:   Adequate, per pt    CURRENT CONCERNS Current Concerns  Post-Acute Placement   Other Concerns:    SOCIAL WORK ASSESSMENT / PLAN CSW met with pt re: PT/OT recommendation for SNF.  CSW explained placement process and answered questions.  Pt reports previous stay at [facility] and prefers this facility again.  CSW completed FL2 and initiated SNF search in [place].  Also initiated [facility] auth for SNF.  Weekday CSW to f/u with offers.  FL2 on paper chart for MD signature.   Assessment/plan status:  Information/Referral to [facility] Other assessment/ plan:   Information/referral to community resources:   SNF  PTAR    PATIENT'S/FAMILY'S RESPONSE TO PLAN OF CARE: Pt reports agreeable to STR stay at SNF in order to increase strength and independence with mobility prior to return home.  Pt verbalized understanding of d/c plan and appreciation for CSW assist.        Dellie Burns, MSW, LCSWA [phone] (Weekends 8:00am-4:30pm)

## 2011-12-26 ENCOUNTER — Encounter (HOSPITAL_COMMUNITY): Payer: Self-pay | Admitting: Orthopedic Surgery

## 2011-12-26 ENCOUNTER — Encounter: Payer: Self-pay | Admitting: Internal Medicine

## 2011-12-26 LAB — CBC
HCT: 29.2 % — ABNORMAL LOW (ref 36.0–46.0)
Hemoglobin: 9.7 g/dL — ABNORMAL LOW (ref 12.0–15.0)
MCH: 26.4 pg (ref 26.0–34.0)
MCHC: 33.2 g/dL (ref 30.0–36.0)
RDW: 16.7 % — ABNORMAL HIGH (ref 11.5–15.5)

## 2011-12-26 LAB — TISSUE CULTURE
Culture: NO GROWTH
Gram Stain: NONE SEEN

## 2011-12-26 LAB — WOUND CULTURE

## 2011-12-26 MED ORDER — HYDROCODONE-ACETAMINOPHEN 10-325 MG PO TABS: 1.0000 | ORAL_TABLET | Freq: Four times a day (QID) | ORAL | Status: DC | PRN

## 2011-12-26 MED ORDER — HEPARIN SOD (PORK) LOCK FLUSH 100 UNIT/ML IV SOLN
250.0000 [IU] | INTRAVENOUS | Status: AC | PRN
  Administered 2011-12-26: 250 [IU]

## 2011-12-26 MED ORDER — VANCOMYCIN HCL IN DEXTROSE 1-5 GM/200ML-% IV SOLN: 1000.0000 mg | Freq: Two times a day (BID) | INTRAVENOUS | Status: DC

## 2011-12-26 MED ORDER — RIFAMPIN 300 MG PO CAPS: 300.0000 mg | ORAL_CAPSULE | Freq: Two times a day (BID) | ORAL | Status: DC

## 2011-12-26 MED ORDER — WARFARIN SODIUM 5 MG PO TABS: 5.0000 mg | ORAL_TABLET | Freq: Every day | ORAL | Status: DC

## 2011-12-26 NOTE — Progress Notes (Signed)
ANTIBIOTIC CONSULT NOTE - FOLLOW UP  Pharmacy Consult for Vancomycin Indication: Right knee infection   Assessment: 76 year old female on day 4 of vancomycin/rifampin for group B strep prosthetic joint infection. Pt to be on antibiotic therapy for at least 6 weeks.   Vancomycin trough came back subtherapeutic at 6.5. No problems with draw per patients nurse.   Goal of Therapy:  Vancomycin trough level 15-20 mcg/ml  Plan:  1. Increase vancomycin to 1750mg  IV q12h this is to be done at the nursing home because patient's discharge summary has been completed.  2. Will need to check another level after the 4th dose, it is possible that the vancomycin could accumulate.  Thank you,  Brett Fairy, PharmD 12/26/2011 12:03 PM   Allergies  Allergen Reactions  . Amoxicillin Swelling    "face, hands"  . Penicillins Swelling and Other (See Comments)    welts    Patient Measurements: Height: 5\' 6"  (167.6 cm) Weight: 186 lb 11.7 oz (84.7 kg) IBW/kg (Calculated) : 59.3  Vital Signs: Temp: 97.8 F (36.6 C) (10/14 0634) Temp src: Oral (10/14 0634) BP: 138/72 mmHg (10/14 0634) Pulse Rate: 74  (10/14 0634) Intake/Output from previous day: 10/13 0701 - 10/14 0700 In: 2250 [P.O.:1040; I.V.:1010; IV Piggyback:200] Out: -  Intake/Output from this shift: Total I/O In: 120 [P.O.:120] Out: -   Labs:  Basename 12/26/11 0500 12/25/11 0633 12/24/11 0634  WBC 7.5 9.1 11.3*  HGB 9.7* 9.3* 10.6*  PLT 246 222 186  LABCREA -- -- --  CREATININE -- -- 0.31*   Estimated Creatinine Clearance: 68.7 ml/min (by C-G formula based on Cr of 0.31).  Basename 12/26/11 0930  VANCOTROUGH 6.5*  VANCOPEAK --  Drue Dun --  GENTTROUGH --  GENTPEAK --  GENTRANDOM --  TOBRATROUGH --  TOBRAPEAK --  TOBRARND --  AMIKACINPEAK --  AMIKACINTROU --  AMIKACIN --     Microbiology: Recent Results (from the past 720 hour(s))  SURGICAL PCR SCREEN     Status: Normal   Collection Time   12/22/11  6:07 PM       Component Value Range Status Comment   MRSA, PCR NEGATIVE  NEGATIVE Final    Staphylococcus aureus NEGATIVE  NEGATIVE Final   BODY FLUID CULTURE     Status: Normal   Collection Time   12/23/11  1:29 PM      Component Value Range Status Comment   Specimen Description FLUID SYNOVIAL RIGHT KNEE   Final    Special Requests NONE   Final    Gram Stain     Final    Value: FEW WBC PRESENT,BOTH PMN AND MONONUCLEAR     NO ORGANISMS SEEN   Culture     Final    Value: FEW GROUP B STREP(S.AGALACTIAE)ISOLATED     Note: SUSCEPTIBILITIES PERFORMED ON PREVIOUS CULTURE WITHIN THE LAST 5 DAYS.     Note: CRITICAL RESULT CALLED TO, READ BACK BY AND VERIFIED WITH: [facility] AMEWUAME 12/24/11 @ 5:4OPM BY RUSCA.   Report Status 12/25/2011 FINAL   Final   FUNGUS CULTURE W SMEAR     Status: Normal (Preliminary result)   Collection Time   12/23/11  1:29 PM      Component Value Range Status Comment   Specimen Description FLUID SYNOVIAL RIGHT KNEE   Final    Special Requests NONE   Final    Fungal Smear NO YEAST OR FUNGAL ELEMENTS SEEN   Final    Culture CULTURE IN PROGRESS FOR FOUR WEEKS  Final    Report Status PENDING   Incomplete   AFB CULTURE WITH SMEAR     Status: Normal (Preliminary result)   Collection Time   12/23/11  1:29 PM      Component Value Range Status Comment   Specimen Description FLUID SYNOVIAL RIGHT KNEE   Final    Special Requests NONE   Final    ACID FAST SMEAR NO ACID FAST BACILLI SEEN   Final    Culture     Final    Value: CULTURE WILL BE EXAMINED FOR 6 WEEKS BEFORE ISSUING A FINAL REPORT   Report Status PENDING   Incomplete   TISSUE CULTURE     Status: Normal   Collection Time   12/23/11  1:33 PM      Component Value Range Status Comment   Specimen Description TISSUE KNEE RIGHT   Final    Special Requests SYNOVIUM   Final    Gram Stain     Final    Value: NO WBC SEEN     NO SQUAMOUS EPITHELIAL CELLS SEEN     NO ORGANISMS SEEN   Culture NO GROWTH 3 DAYS   Final     Report Status 12/26/2011 FINAL   Final   AFB CULTURE WITH SMEAR     Status: Normal (Preliminary result)   Collection Time   12/23/11  1:33 PM      Component Value Range Status Comment   Specimen Description TISSUE KNEE RIGHT   Final    Special Requests SYNOVIUM   Final    ACID FAST SMEAR NO ACID FAST BACILLI SEEN   Final    Culture     Final    Value: CULTURE WILL BE EXAMINED FOR 6 WEEKS BEFORE ISSUING A FINAL REPORT   Report Status PENDING   Incomplete   FUNGUS CULTURE W SMEAR     Status: Normal (Preliminary result)   Collection Time   12/23/11  1:33 PM      Component Value Range Status Comment   Specimen Description TISSUE KNEE RIGHT   Final    Special Requests SYNOVIUM   Final    Fungal Smear NO YEAST OR FUNGAL ELEMENTS SEEN   Final    Culture CULTURE IN PROGRESS FOR FOUR WEEKS   Final    Report Status PENDING   Incomplete   ANAEROBIC CULTURE     Status: Normal (Preliminary result)   Collection Time   12/23/11  1:33 PM      Component Value Range Status Comment   Specimen Description TISSUE KNEE RIGHT   Final    Special Requests SYNOVIUM   Final    Gram Stain PENDING   Incomplete    Culture     Final    Value: NO ANAEROBES ISOLATED; CULTURE IN PROGRESS FOR 5 DAYS   Report Status PENDING   Incomplete   WOUND CULTURE     Status: Normal (Preliminary result)   Collection Time   12/23/11  1:35 PM      Component Value Range Status Comment   Specimen Description WOUND KNEE RIGHT   Final    Special Requests RT KNEE CAPSULE   Final    Gram Stain     Final    Value: FEW WBC PRESENT, PREDOMINANTLY PMN     NO SQUAMOUS EPITHELIAL CELLS SEEN     NO ORGANISMS SEEN   Culture Culture reincubated for better growth   Final    Report Status PENDING  Incomplete   ANAEROBIC CULTURE     Status: Normal (Preliminary result)   Collection Time   12/23/11  1:35 PM      Component Value Range Status Comment   Specimen Description WOUND KNEE RIGHT   Final    Special Requests RT KNEE CAPSULE    Final    Gram Stain PENDING   Incomplete    Culture     Final    Value: NO ANAEROBES ISOLATED; CULTURE IN PROGRESS FOR 5 DAYS   Report Status PENDING   Incomplete   AFB CULTURE WITH SMEAR     Status: Normal (Preliminary result)   Collection Time   12/23/11  1:35 PM      Component Value Range Status Comment   Specimen Description WOUND KNEE RIGHT   Final    Special Requests RT KNEE CAPSULE   Final    ACID FAST SMEAR NO ACID FAST BACILLI SEEN   Final    Culture     Final    Value: CULTURE WILL BE EXAMINED FOR 6 WEEKS BEFORE ISSUING A FINAL REPORT   Report Status PENDING   Incomplete   FUNGUS CULTURE W SMEAR     Status: Normal (Preliminary result)   Collection Time   12/23/11  1:35 PM      Component Value Range Status Comment   Specimen Description WOUND KNEE RIGHT   Final    Special Requests RT KNEE CAPSULE   Final    Fungal Smear NO YEAST OR FUNGAL ELEMENTS SEEN   Final    Culture CULTURE IN PROGRESS FOR FOUR WEEKS   Final    Report Status PENDING   Incomplete     Anti-infectives     Start     Dose/Rate Route Frequency Ordered Stop   12/26/11 0000   rifampin (RIFADIN) 300 MG capsule        300 mg Oral 2 times daily after meals 12/26/11 0830     12/26/11 0000   vancomycin (VANCOCIN) 1 GM/200ML SOLN        1,000 mg 200 mL/hr over 60 Minutes Intravenous Every 12 hours 12/26/11 0830     12/24/11 2200   vancomycin (VANCOCIN) 1,500 mg in sodium chloride 0.9 % 500 mL IVPB  Status:  Discontinued        1,500 mg 250 mL/hr over 120 Minutes Intravenous Every 24 hours 12/23/11 2127 12/24/11 0825   12/24/11 2200   vancomycin (VANCOCIN) IVPB 1000 mg/200 mL premix        1,000 mg 200 mL/hr over 60 Minutes Intravenous Every 12 hours 12/24/11 0840     12/24/11 1800   rifampin (RIFADIN) capsule 300 mg        300 mg Oral 2 times daily after meals 12/24/11 1427     12/24/11 0030   vancomycin (VANCOCIN) 1,500 mg in sodium chloride 0.9 % 500 mL IVPB  Status:  Discontinued        1,500  mg 250 mL/hr over 120 Minutes Intravenous Every 24 hours 12/23/11 2127 12/24/11 0840   12/23/11 2200   rifampin (RIFADIN) capsule 300 mg  Status:  Discontinued        300 mg Oral Every 12 hours 12/23/11 1644 12/24/11 1427   12/23/11 1300   vancomycin (VANCOCIN) 1,000 mg in sodium chloride 0.9 % 250 mL IVPB  Status:  Discontinued        1,000 mg 250 mL/hr over 60 Minutes Intravenous  Once 12/23/11 1218 12/23/11 1244  12/23/11 1300   vancomycin (VANCOCIN) IVPB 1000 mg/200 mL premix        1,000 mg 200 mL/hr over 60 Minutes Intravenous  Once 12/23/11 1244 12/23/11 1343

## 2011-12-26 NOTE — Discharge Summary (Addendum)
Physician Discharge Summary  Patient ID: [name] MRN: [id number] DOB/AGE: [birth year] 76 y.o.  Admit date: 12/22/2011 Discharge date: 12/26/2011  Admission Diagnoses:  Infection of total right knee replacement  Discharge Diagnoses:  Principal Problem:  *Infection of total right knee replacement Group B Strep Active Problems:  Obesity (BMI 30.0-34.9)  DEPRESSION/ANXIETY  HYPERTENSION  CORONARY ARTERY DISEASE, S/P PTCA  GERD  Group B streptococcal infection  Status post right total knee replacement   Past Medical History  Diagnosis Date  . Cholelithiasis 07/1996  . Hyperlipidemia 03/1999    takes Lipitor nightly  . NSVD (normal spontaneous vaginal delivery)     x 5  . Allergy 08/26/2002    [facility] hemoptysis actually allergic rhinitis  . CAD (coronary artery disease)     1 stent  . Peripheral edema     takes Furosemide daily  . Pneumonia     hx of--as a child  . Osteoarthritis 07/1996  . Joint pain   . Joint swelling   . Back pain     pt states from knee pain  . GERD (gastroesophageal reflux disease)     takes Prevacid daily  . Urinary frequency   . Depression     takes Zoloft daily  . Primary osteoarthritis of left knee 09/23/2010    Per Dr. Dion Saucier with [facility]   . Osteoarthritis of left knee 08/02/2011  . Hypertension 06/1999  . Osteoarthritis of right knee 11/01/2011  . Obesity (BMI 30.0-34.9) 06/21/2006    Qualifier: Diagnosis of  By: Hetty Ely MD, Franne Grip   . Kidney stones   . Left leg DVT 07/2010  . Exertional dyspnea 11/03/2011  . History of gout   . Infection of total right knee replacement 12/23/2011  . Status post right total knee replacement 12/25/2011    Surgeries: Procedure(s): I&D and poly exchange right KNEE REVISION on 12/22/2011 - 12/23/2011   Consultants (if any):    Discharged Condition: Improved  Hospital Course: [name] is an 76 y.o. female who was admitted 12/22/2011 with a diagnosis of Infection of  total right knee replacement and went to the operating room on 12/22/2011 - 12/23/2011 and underwent the above named procedures.    She was given perioperative antibiotics:  Anti-infectives     Start     Dose/Rate Route Frequency Ordered Stop   12/26/11 0000   rifampin (RIFADIN) 300 MG capsule        300 mg Oral 2 times daily after meals 12/26/11 0830     12/26/11 0000   vancomycin (VANCOCIN) 1 GM/200ML SOLN        1,000 mg 200 mL/hr over 60 Minutes Intravenous Every 12 hours 12/26/11 0830     12/24/11 2200   vancomycin (VANCOCIN) 1,500 mg in sodium chloride 0.9 % 500 mL IVPB  Status:  Discontinued        1,500 mg 250 mL/hr over 120 Minutes Intravenous Every 24 hours 12/23/11 2127 12/24/11 0825   12/24/11 2200   vancomycin (VANCOCIN) IVPB 1000 mg/200 mL premix        1,000 mg 200 mL/hr over 60 Minutes Intravenous Every 12 hours 12/24/11 0840     12/24/11 1800   rifampin (RIFADIN) capsule 300 mg        300 mg Oral 2 times daily after meals 12/24/11 1427     12/24/11 0030   vancomycin (VANCOCIN) 1,500 mg in sodium chloride 0.9 % 500 mL IVPB  Status:  Discontinued  1,500 mg 250 mL/hr over 120 Minutes Intravenous Every 24 hours 12/23/11 2127 12/24/11 0840   12/23/11 2200   rifampin (RIFADIN) capsule 300 mg  Status:  Discontinued        300 mg Oral Every 12 hours 12/23/11 1644 12/24/11 1427   12/23/11 1300   vancomycin (VANCOCIN) 1,000 mg in sodium chloride 0.9 % 250 mL IVPB  Status:  Discontinued        1,000 mg 250 mL/hr over 60 Minutes Intravenous  Once 12/23/11 1218 12/23/11 1244   12/23/11 1300   vancomycin (VANCOCIN) IVPB 1000 mg/200 mL premix        1,000 mg 200 mL/hr over 60 Minutes Intravenous  Once 12/23/11 1244 12/23/11 1343        .  She was given sequential compression devices, early ambulation, and lovenox bridging to coumadin for DVT prophylaxis, with a history of DVT.  She was found to have Group B Strep in her knee.  Abx per ID.  She benefited maximally  from the hospital stay and there were no complications.    Recent vital signs:  Filed Vitals:   12/26/11 0800  BP:   Pulse:   Temp:   Resp: 18    Recent laboratory studies:  Lab Results  Component Value Date   HGB 9.7* 12/26/2011   HGB 9.3* 12/25/2011   HGB 10.6* 12/24/2011   Lab Results  Component Value Date   WBC 7.5 12/26/2011   PLT 246 12/26/2011   Lab Results  Component Value Date   INR 2.39* 12/26/2011   Lab Results  Component Value Date   NA 137 12/24/2011   K 3.8 12/24/2011   CL 103 12/24/2011   CO2 27 12/24/2011   BUN 4* 12/24/2011   CREATININE 0.31* 12/24/2011   GLUCOSE 122* 12/24/2011    Discharge Medications:     Medication List     As of 12/26/2011  8:31 AM    TAKE these medications         aspirin 81 MG tablet   Take 81 mg by mouth daily.      atenolol 25 MG tablet   Commonly known as: TENORMIN   Take 25 mg by mouth daily.      atorvastatin 80 MG tablet   Commonly known as: LIPITOR   Take 1 tablet (80 mg total) by mouth daily.      cholecalciferol 1000 UNITS tablet   Commonly known as: VITAMIN D   Take 1,000 Units by mouth 2 (two) times daily.      diazepam 5 MG tablet   Commonly known as: VALIUM   Take 2.5 mg by mouth every 12 (twelve) hours as needed. For anxiety      diltiazem 240 MG 24 hr capsule   Commonly known as: TIAZAC   Take 240 mg by mouth daily.      docusate sodium 100 MG capsule   Commonly known as: COLACE   Take 100 mg by mouth daily.      furosemide 20 MG tablet   Commonly known as: LASIX   Take 20 mg by mouth every morning.      HYDROcodone-acetaminophen 10-325 MG per tablet   Commonly known as: NORCO   Take 1-2 tablets by mouth every 6 (six) hours as needed for pain. For pain      lansoprazole 15 MG capsule   Commonly known as: PREVACID   Take 1 capsule (15 mg total) by mouth 2 (two) times daily.  promethazine 25 MG tablet   Commonly known as: PHENERGAN   Take 25 mg by mouth every 6 (six) hours  as needed. For nausea      rifampin 300 MG capsule   Commonly known as: RIFADIN   Take 1 capsule (300 mg total) by mouth 2 (two) times daily after a meal.      sertraline 100 MG tablet   Commonly known as: ZOLOFT   Take 1 tablet (100 mg total) by mouth daily.      vancomycin 1 GM/200ML Soln   Commonly known as: VANCOCIN   Inject 200 mLs (1,000 mg total) into the vein every 12 (twelve) hours.      vitamin B-12 1000 MCG tablet   Commonly known as: CYANOCOBALAMIN   Take 1,000 mcg by mouth daily.      vitamin C 500 MG tablet   Commonly known as: ASCORBIC ACID   Take 500 mg by mouth daily.      warfarin 5 MG tablet   Commonly known as: COUMADIN   Take 1 tablet (5 mg total) by mouth daily.        Diagnostic Studies:  Chest 2 View  12/22/2011  *RADIOLOGY REPORT*  Clinical Data: 76 year old female preoperative study.  Coronary artery disease.  CHEST - 2 VIEW  Comparison: 07/27/2011.  Findings: Seated AP and lateral views of the chest. Stable cardiomegaly and mediastinal contours.  Stable lung volumes.  No pneumothorax or pulmonary edema.  Mild eventration of the diaphragm.  No pleural effusion or acute pulmonary opacity. Flowing osteophytes in the spine. No acute osseous abnormality identified.  IMPRESSION: Stable cardiomegaly.  No acute cardiopulmonary abnormality.   Original Report Authenticated By: Harley Hallmark, M.D.    Dg Chest Port 1 View  12/24/2011  *RADIOLOGY REPORT*  Clinical Data: PICC line placement  PORTABLE CHEST - 1 VIEW  Comparison: 12/22/2011  Findings: Mild cardiomegaly stable.  Left PICC line has been placed with tip projecting over the distal superior vena cava.  No pneumothorax.  Lungs are clear.  IMPRESSION: Left PICC line placement.   Original Report Authenticated By: Otilio Carpen, M.D.     Disposition: SNF      Discharge Orders    Future Orders Please Complete By Expires   Diet general      Weight bearing as tolerated      Call MD / Call 911       Comments:   If you experience chest pain or shortness of breath, CALL 911 and be transported to the hospital emergency room.  If you develope a fever above 101 F, pus (white drainage) or increased drainage or redness at the wound, or calf pain, call your surgeon's office.   Discharge instructions      Comments:   Change dressing in 3 days and reapply fresh dressing, unless you have a splint (half cast).  If you have a splint/cast, just leave in place until your follow-up appointment.    Keep wounds dry for 3 weeks.  Leave steri-strips in place on skin.  Do not apply lotion or anything to the wound.   Constipation Prevention      Comments:   Drink plenty of fluids.  Prune juice may be helpful.  You may use a stool softener, such as Colace (over the counter) 100 mg twice a day.  Use MiraLax (over the counter) for constipation as needed.   TED hose      Comments:   Use stockings (TED hose)  for 2 weeks on both leg(s).  You may remove them at night for sleeping.   Change dressing      Comments:   Change dressing in three days, then change the dressing daily with sterile 4 x 4 inch gauze dressing.  You may clean the incision with alcohol prior to redressing.   Do not put a pillow under the knee. Place it under the heel.         Follow-up Information    Follow up with Ritaj Dullea P, MD. In 1 week.   Contact information:   [facility] [address] [place] [address] [phone]           Signed: Eulas Post 12/26/2011, 8:31 AM

## 2011-12-26 NOTE — Progress Notes (Signed)
12/26/11 1200  PT Visit Information  Last PT Received On 12/26/11  Assistance Needed +1  PT Time Calculation  PT Start Time 1130  PT Stop Time 1157  PT Time Calculation (min) 27 min  Precautions  Precautions Knee  Knee Immobilizer - Right Discontinue post op day 2  Restrictions  Weight Bearing Restrictions No  Other Position/Activity Restrictions WBAT  Cognition  Overall Cognitive Status Appears within functional limits for tasks assessed/performed  Arousal/Alertness Awake/alert  Orientation Level Appears intact for tasks assessed  Behavior During Session [facility] for tasks performed  Bed Mobility  Bed Mobility Supine to Sit;Sitting - Scoot to Edge of Bed  Supine to Sit 4: Min assist;HOB elevated  Sitting - Scoot to [place] of Bed 5: Supervision  Details for Bed Mobility Assistance Min assist for RLE  Transfers  Transfers Sit to Stand;Stand to Sit  Sit to Stand 4: Min assist;With upper extremity assist;From bed  Stand to Sit 4: Min assist;With upper extremity assist;To chair/3-in-1;With armrests  Details for Transfer Assistance verbal cues for hand placement and to slide RLE out for stand-sit  Ambulation/Gait  Ambulation/Gait Assistance 4: Min assist  Ambulation Distance (Feet) 85 Feet  Assistive device Rolling walker  Ambulation/Gait Assistance Details Improved endurance today with gait  Gait Pattern Step-to pattern;Decreased stance time - right;Decreased stride length;Shuffle;Antalgic  Stairs No  Wheelchair Mobility  Wheelchair Mobility No  Exercises  Exercises Total Joint  Total Joint Exercises  Ankle Circles/Pumps AROM;Both;10 reps  Quad Sets AROM;Right;20 reps;Supine  Short Arc Quad AAROM;Right;10 reps;Supine  Knee Flexion AAROM;Right;5 reps;Seated  PT - End of Session  Equipment Utilized During Treatment Gait belt  Activity Tolerance Patient tolerated treatment well  Patient left in chair;with call bell/phone within reach  Nurse Communication Mobility status  PT -  Assessment/Plan  Comments on Treatment Session Pt with nice improvement in gait today.  Encouraged quad sets to improve right knee extension.  Pt for d/c today to SNF.  PT Plan Discharge plan remains appropriate;Frequency remains appropriate  PT Frequency 7X/week  Follow Up Recommendations Post acute inpatient  Does the patient have the potential to tolerate intense rehabilitation? No, Recommend SNF  Equipment Recommended None recommended by PT  Acute Rehab PT Goals  PT Goal: Stand - Progress Progressing toward goal  PT Goal: Ambulate - Progress Progressing toward goal  PT Goal: Perform Home Exercise Program - Progress Progressing toward goal  PT General Charges  $$ ACUTE PT VISIT 1 Procedure  PT Treatments  $Gait Training 8-22 mins  $Therapeutic Exercise 8-22 mins   [name], PTA Acute Rehab [phone] (office)

## 2011-12-28 ENCOUNTER — Telehealth: Payer: Self-pay | Admitting: Family Medicine

## 2011-12-28 LAB — ANAEROBIC CULTURE

## 2011-12-28 NOTE — Telephone Encounter (Signed)
I called home number and LMOVM for patient and husband; wished her well with her recovery.

## 2011-12-29 LAB — BASIC METABOLIC PANEL
BUN: 7 mg/dL (ref 7–18)
Chloride: 101 mmol/L (ref 98–107)
Creatinine: 1.38 mg/dL — ABNORMAL HIGH (ref 0.60–1.30)
EGFR (African American): 44 — ABNORMAL LOW
EGFR (Non-African Amer.): 38 — ABNORMAL LOW
Glucose: 96 mg/dL (ref 65–99)
Osmolality: 277 (ref 275–301)
Potassium: 3.3 mmol/L — ABNORMAL LOW (ref 3.5–5.1)

## 2011-12-30 LAB — VANCOMYCIN, TROUGH: Vancomycin, Trough: 29 ug/mL (ref 10–20)

## 2012-01-01 LAB — BASIC METABOLIC PANEL
Anion Gap: 10 (ref 7–16)
Calcium, Total: 7.7 mg/dL — ABNORMAL LOW (ref 8.5–10.1)
Co2: 26 mmol/L (ref 21–32)
Glucose: 85 mg/dL (ref 65–99)
Osmolality: 284 (ref 275–301)
Sodium: 144 mmol/L (ref 136–145)

## 2012-01-01 LAB — VANCOMYCIN, TROUGH: Vancomycin, Trough: 19 ug/mL (ref 10–20)

## 2012-01-02 ENCOUNTER — Encounter (HOSPITAL_COMMUNITY): Payer: Self-pay | Admitting: Orthopedic Surgery

## 2012-01-03 LAB — BASIC METABOLIC PANEL
Anion Gap: 10 (ref 7–16)
BUN: 7 mg/dL (ref 7–18)
Calcium, Total: 8.2 mg/dL — ABNORMAL LOW (ref 8.5–10.1)
Creatinine: 1.08 mg/dL (ref 0.60–1.30)
EGFR (African American): 59 — ABNORMAL LOW
EGFR (Non-African Amer.): 51 — ABNORMAL LOW
Glucose: 88 mg/dL (ref 65–99)
Osmolality: 277 (ref 275–301)
Potassium: 2.9 mmol/L — ABNORMAL LOW (ref 3.5–5.1)

## 2012-01-05 LAB — BASIC METABOLIC PANEL
Anion Gap: 8 (ref 7–16)
Calcium, Total: 8 mg/dL — ABNORMAL LOW (ref 8.5–10.1)
Co2: 27 mmol/L (ref 21–32)
Potassium: 3.6 mmol/L (ref 3.5–5.1)
Sodium: 139 mmol/L (ref 136–145)

## 2012-01-05 LAB — VANCOMYCIN, TROUGH: Vancomycin, Trough: 24 ug/mL (ref 10–20)

## 2012-01-09 LAB — BASIC METABOLIC PANEL
BUN: 7 mg/dL (ref 7–18)
Calcium, Total: 8.1 mg/dL — ABNORMAL LOW (ref 8.5–10.1)
Chloride: 103 mmol/L (ref 98–107)
Co2: 27 mmol/L (ref 21–32)
Creatinine: 1.12 mg/dL (ref 0.60–1.30)
Glucose: 77 mg/dL (ref 65–99)
Osmolality: 276 (ref 275–301)
Potassium: 3.9 mmol/L (ref 3.5–5.1)

## 2012-01-12 LAB — BASIC METABOLIC PANEL
Anion Gap: 12 (ref 7–16)
BUN: 9 mg/dL (ref 7–18)
Chloride: 102 mmol/L (ref 98–107)
Co2: 24 mmol/L (ref 21–32)
EGFR (African American): 49 — ABNORMAL LOW
Osmolality: 274 (ref 275–301)
Potassium: 4.3 mmol/L (ref 3.5–5.1)
Sodium: 138 mmol/L (ref 136–145)

## 2012-01-12 LAB — VANCOMYCIN, TROUGH: Vancomycin, Trough: 21 ug/mL (ref 10–20)

## 2012-01-13 ENCOUNTER — Encounter: Payer: Self-pay | Admitting: Internal Medicine

## 2012-01-17 LAB — CREATININE, SERUM
Creatinine: 1.13 mg/dL (ref 0.60–1.30)
EGFR (African American): 55 — ABNORMAL LOW

## 2012-01-19 LAB — FUNGUS CULTURE W SMEAR: Fungal Smear: NONE SEEN

## 2012-01-23 ENCOUNTER — Ambulatory Visit (INDEPENDENT_AMBULATORY_CARE_PROVIDER_SITE_OTHER): Payer: Medicare Other | Admitting: Family Medicine

## 2012-01-23 ENCOUNTER — Encounter: Payer: Self-pay | Admitting: Family Medicine

## 2012-01-23 VITALS — BP 144/94 | HR 96 | Temp 98.4°F

## 2012-01-23 DIAGNOSIS — I10 Essential (primary) hypertension: Secondary | ICD-10-CM

## 2012-01-23 DIAGNOSIS — R110 Nausea: Secondary | ICD-10-CM | POA: Insufficient documentation

## 2012-01-23 MED ORDER — PROMETHAZINE HCL 25 MG PO TABS: 25.0000 mg | ORAL_TABLET | Freq: Three times a day (TID) | ORAL | Status: DC | PRN

## 2012-01-23 MED ORDER — ONDANSETRON HCL 4 MG PO TABS: 4.0000 mg | ORAL_TABLET | Freq: Three times a day (TID) | ORAL | Status: DC | PRN

## 2012-01-23 NOTE — Assessment & Plan Note (Signed)
With reasonable control of BP now.  Would continue as is with meds and try to treat nausea as change in fluid intake would affect pressure reading.  She agrees.

## 2012-01-23 NOTE — Patient Instructions (Addendum)
Take promethazine 3 times a day for nausea. If not improved, then take zofran up to 3 times a day.  Try to drink more fluids.  Take care.

## 2012-01-23 NOTE — Progress Notes (Signed)
H/o R TKR, infected and now with abx per ID clinic and s/p repeat ortho intervention.  In [place] for rehab.  BP has been variable.  No CP, not SOB.    Frequent nausea.  Pain controlled.  No fevers.  No blood in stool, vomit.  Taking phenergan ~daily per facility records. No ADE from phenergan.   She is frustrated by long recovery  PMH and SH reviewed  ROS: See HPI, otherwise noncontributory.  Meds, vitals, and allergies reviewed.   nad ncat Mmm rrr ctab PICC site clean on L arm abd soft, not ttp Ext w/o edema R knee w/o erythema

## 2012-01-23 NOTE — Assessment & Plan Note (Addendum)
Pain controlled, possible abx related.  Would inc phenergan to tid and then use zofran prn if not controlled. nonfocal abd exam today.  No signs of colitis, etc.  Okay for outpatient f/u.  >25 min spent with face to face with patient, >50% counseling and/or coordinating care

## 2012-01-26 LAB — VANCOMYCIN, TROUGH: Vancomycin, Trough: 15 ug/mL (ref 10–20)

## 2012-02-02 LAB — CREATININE, SERUM
EGFR (African American): >60
EGFR (Non-African Amer.): 52 — ABNORMAL LOW

## 2012-02-05 LAB — AFB CULTURE WITH SMEAR (NOT AT ARMC)
Acid Fast Smear: NONE SEEN
Acid Fast Smear: NONE SEEN

## 2012-02-06 ENCOUNTER — Ambulatory Visit (INDEPENDENT_AMBULATORY_CARE_PROVIDER_SITE_OTHER): Payer: Medicare Other | Admitting: Internal Medicine

## 2012-02-06 ENCOUNTER — Encounter: Payer: Self-pay | Admitting: Internal Medicine

## 2012-02-06 VITALS — BP 102/70 | HR 72 | Temp 97.8°F | Ht 66.0 in | Wt 156.0 lb

## 2012-02-06 DIAGNOSIS — Z96659 Presence of unspecified artificial knee joint: Secondary | ICD-10-CM

## 2012-02-06 DIAGNOSIS — T8453XA Infection and inflammatory reaction due to internal right knee prosthesis, initial encounter: Secondary | ICD-10-CM

## 2012-02-06 DIAGNOSIS — T8450XA Infection and inflammatory reaction due to unspecified internal joint prosthesis, initial encounter: Secondary | ICD-10-CM

## 2012-02-06 LAB — CBC WITH DIFFERENTIAL/PLATELET
Eosinophils Absolute: 0.1 10*3/uL (ref 0.0–0.7)
Eosinophils Relative: 1 % (ref 0–5)
HCT: 25 % — ABNORMAL LOW (ref 36.0–46.0)
Hemoglobin: 8 g/dL — ABNORMAL LOW (ref 12.0–15.0)
Lymphs Abs: 1.9 10*3/uL (ref 0.7–4.0)
MCH: 26.1 pg (ref 26.0–34.0)
MCV: 81.7 fL (ref 78.0–100.0)
Monocytes Absolute: 0.6 10*3/uL (ref 0.1–1.0)
Monocytes Relative: 7 % (ref 3–12)
RBC: 3.06 MIL/uL — ABNORMAL LOW (ref 3.87–5.11)

## 2012-02-06 LAB — AFB CULTURE WITH SMEAR (NOT AT ARMC)

## 2012-02-06 MED ORDER — CEFDINIR 300 MG PO CAPS: 300.0000 mg | ORAL_CAPSULE | Freq: Two times a day (BID) | ORAL | Status: DC

## 2012-02-06 NOTE — Assessment & Plan Note (Signed)
She has done well with her infection. I will check her CRP and sedimentation rate today and if reassuring we'll have her stop her IV medicine in transition her to an oral cephalosporin. After reviewing her allergy, I am not convinced that this is a severe allergy and she will get a dose of the cephalosporin at her facility noticed a call if she has any problems. She will return in one month.

## 2012-02-06 NOTE — Progress Notes (Signed)
  Subjective:    Patient ID: [name], female    DOB: [birth year], 76 y.o.   MRN: [id number]  HPI with osteoarthritis s/p TKA on 11/01/11, initially did well post-operatively,but then reported sudden onset of shaking chills, fevers, increasing swelling to her right knee, with some warmth to touch, unable to bear weight, unable to walk noted on 10/07. She was admitted on 10/10, found to have fever 101.23F, leukocytosis of 12. Her physical exam revealed warmth and erythema to distal part of incision, no visible dehiscence. Had an arthrocentesis noted to have 34,000, culture results unknown. She underwent an irrigation and debridement of right total knee infection with complete synovectomy and polyethylene exchange on 10/11, started on vancomycin and rifampin. OR report showed purulence and necrosis of synovial tiusse and patellar tendon/suprapatellar pouch.  Culture grew group B strep.  Due to allergy, she was continued on vancomycin and rifampin.  She now presents about 6 weeks into her projected 6 week course of IV antibiotics. She is doing well, no knee swelling, no rash, no fever or chills. Her knee is really doing well. She is at a facility for her and IV antibiotics.  She does have a history of penicillin allergy in discussion with her, she developed the allergy when she was in high school and was notable for some possible hives and swelling in her  Hands but no anaphylaxis. She has not though had any antibiotics to her knowledge other than what sounds like a sulfa-based antibiotic. She has not had exposures to penicillin or cephalosporins that she knows of since high school.    Review of Systems  Constitutional: Negative for fever, chills, fatigue and unexpected weight change.  HENT: Negative for sore throat and trouble swallowing.   Respiratory: Negative for cough and shortness of breath.   Cardiovascular: Negative for chest pain, palpitations and leg swelling.  Gastrointestinal: Negative for  nausea, abdominal pain and diarrhea.  Musculoskeletal: Negative for myalgias, joint swelling and arthralgias.  Skin: Negative for rash.  Neurological: Negative for dizziness.       Objective:   Physical Exam  Constitutional: She appears well-developed and well-nourished. No distress.  Cardiovascular: Normal rate, regular rhythm and normal heart sounds.  Exam reveals no gallop and no friction rub.   No murmur heard. Musculoskeletal: Normal range of motion. She exhibits no edema and no tenderness.  Skin: Skin is warm and dry. No rash noted.          Assessment & Plan:

## 2012-02-07 LAB — COMPLETE METABOLIC PANEL WITH GFR
BUN: 10 mg/dL (ref 6–23)
CO2: 23 mEq/L (ref 19–32)
Creat: 0.92 mg/dL (ref 0.50–1.10)
GFR, Est African American: 71 mL/min
GFR, Est Non African American: 62 mL/min
Glucose, Bld: 113 mg/dL — ABNORMAL HIGH (ref 70–99)
Sodium: 133 mEq/L — ABNORMAL LOW (ref 135–145)
Total Bilirubin: 0.3 mg/dL (ref 0.3–1.2)
Total Protein: 6.5 g/dL (ref 6.0–8.3)

## 2012-02-12 ENCOUNTER — Encounter: Payer: Self-pay | Admitting: Internal Medicine

## 2012-02-20 ENCOUNTER — Telehealth: Payer: Self-pay

## 2012-02-20 NOTE — Telephone Encounter (Signed)
I wouldn't do anything differently at this point.  Would follow symptoms- if worse then notify us, esp if fever, vomiting, etc.  I would continue with fluids for now.  Thanks.

## 2012-02-20 NOTE — Telephone Encounter (Signed)
[name] with [name] advised.

## 2012-02-20 NOTE — Telephone Encounter (Signed)
Diane nurse with [facility] left v/m said pt is not feeling good today;pt feels"down" and pt not eating; states all food taste bad and has sweet taste.pt presently taking antibiotic due to knee surgery cesdinir 300 mg bid for 6 months and Zoloft 125mg .Diane advised pt to drink Gatorade until gets Dr Lianne Bushy instruction. If pt needs to be seen or can suggest something Diane can do at the home call Diane with [place].

## 2012-02-27 ENCOUNTER — Other Ambulatory Visit: Payer: Self-pay | Admitting: *Deleted

## 2012-02-27 MED ORDER — DILTIAZEM HCL ER BEADS 240 MG PO CP24: 240.0000 mg | ORAL_CAPSULE | Freq: Every day | ORAL | Status: DC

## 2012-03-05 ENCOUNTER — Other Ambulatory Visit: Payer: Self-pay | Admitting: *Deleted

## 2012-03-05 MED ORDER — FUROSEMIDE 20 MG PO TABS: 20.0000 mg | ORAL_TABLET | Freq: Every morning | ORAL | Status: DC

## 2012-03-14 HISTORY — PX: CARDIOVASCULAR STRESS TEST: SHX262

## 2012-03-20 ENCOUNTER — Encounter: Payer: Self-pay | Admitting: Internal Medicine

## 2012-03-20 ENCOUNTER — Ambulatory Visit (INDEPENDENT_AMBULATORY_CARE_PROVIDER_SITE_OTHER): Payer: Medicare Other | Admitting: Internal Medicine

## 2012-03-20 VITALS — BP 210/89 | HR 59 | Temp 98.1°F | Ht 66.0 in | Wt 161.0 lb

## 2012-03-20 DIAGNOSIS — T8453XA Infection and inflammatory reaction due to internal right knee prosthesis, initial encounter: Secondary | ICD-10-CM

## 2012-03-20 DIAGNOSIS — Z96659 Presence of unspecified artificial knee joint: Secondary | ICD-10-CM

## 2012-03-20 DIAGNOSIS — Y849 Medical procedure, unspecified as the cause of abnormal reaction of the patient, or of later complication, without mention of misadventure at the time of the procedure: Secondary | ICD-10-CM

## 2012-03-20 DIAGNOSIS — T8450XA Infection and inflammatory reaction due to unspecified internal joint prosthesis, initial encounter: Secondary | ICD-10-CM

## 2012-03-20 NOTE — Progress Notes (Signed)
  Subjective:    Patient ID: [name], female    DOB: January 02, 1936, 77 y.o.   MRN: [id number]  HPI  Patient with osteoarthritis s/p TKA on 11/01/11, initially did well post-operatively,but then reported sudden onset of shaking chills, fevers, increasing swelling to her right knee, with some warmth to touch, unable to bear weight, unable to walk noted on 10/07. She was admitted on 10/10, found to have fever 101.15F, leukocytosis of 12. Her physical exam revealed warmth and erythema to distal part of incision, no visible dehiscence. Had an arthrocentesis noted to have 34,000, culture results unknown. She underwent an irrigation and debridement of right total knee infection with complete synovectomy and polyethylene exchange on 10/11, started on vancomycin and rifampin. OR report showed purulence and necrosis of synovial tiusse and patellar tendon/suprapatellar pouch. Culture grew group B strep. Due to allergy, she was continued on vancomycin and rifampin. She completed 6 weeks of IV therapy and was switched to [facility] and rifampin.  She is doing well, no knee swelling, no rash, no fever or chills. Her knee is really doing well.     She is here for a one-month followup after switching to oral therapy. She has had no problems with the current therapy including no rashes or abdominal discomfort or diarrhea.    Review of Systems  Constitutional: Negative for fever, chills and appetite change.  Gastrointestinal: Negative for nausea, vomiting, abdominal pain and diarrhea.  Musculoskeletal: Negative for myalgias and arthralgias.       Some minimal joint swelling of the right knee       Objective:   Physical Exam  Constitutional: She appears well-developed and well-nourished. No distress.  Musculoskeletal:       Right knee with well-healed incision and no edema  Skin: No rash noted.          Assessment & Plan:

## 2012-03-20 NOTE — Assessment & Plan Note (Addendum)
She is doing well with her current therapy of the group B strep prosthetic joint infection. I plan on completing 6 months of therapy which will end in about mid April. I will see her at that time. I will check her labs today to assure no significant LFT rise or other side effects. I will also check inflammatory markers to assure no significant inflammation.  Addendum: labs noted.  ESR elevated some.  CRP though ok.  She will continue antibiotics and I will recheck next visit.

## 2012-03-21 LAB — CBC WITH DIFFERENTIAL/PLATELET
Lymphocytes Relative: 24 % (ref 12–46)
Lymphs Abs: 1.6 10*3/uL (ref 0.7–4.0)
Neutrophils Relative %: 68 % (ref 43–77)
Platelets: 387 10*3/uL (ref 150–400)
RBC: 4.05 MIL/uL (ref 3.87–5.11)
WBC: 6.6 10*3/uL (ref 4.0–10.5)

## 2012-03-21 LAB — COMPLETE METABOLIC PANEL WITH GFR
ALT: 8 U/L (ref 0–35)
AST: 12 U/L (ref 0–37)
Calcium: 8.9 mg/dL (ref 8.4–10.5)
Chloride: 104 mEq/L (ref 96–112)
Creat: 0.82 mg/dL (ref 0.50–1.10)

## 2012-03-21 LAB — SEDIMENTATION RATE: Sed Rate: 79 mm/hr — ABNORMAL HIGH (ref 0–22)

## 2012-05-23 ENCOUNTER — Other Ambulatory Visit: Payer: Self-pay | Admitting: Family Medicine

## 2012-05-23 MED ORDER — ATENOLOL 25 MG PO TABS: 25.0000 mg | ORAL_TABLET | Freq: Every day | ORAL | Status: DC

## 2012-06-18 ENCOUNTER — Ambulatory Visit: Payer: Medicare Other | Admitting: Internal Medicine

## 2012-06-19 ENCOUNTER — Ambulatory Visit: Payer: Medicare Other | Admitting: Internal Medicine

## 2012-07-04 ENCOUNTER — Encounter: Payer: Self-pay | Admitting: Family Medicine

## 2012-07-04 ENCOUNTER — Ambulatory Visit (INDEPENDENT_AMBULATORY_CARE_PROVIDER_SITE_OTHER): Payer: Medicare Other | Admitting: Family Medicine

## 2012-07-04 VITALS — BP 148/90 | HR 68 | Temp 98.4°F | Wt 180.5 lb

## 2012-07-04 DIAGNOSIS — R109 Unspecified abdominal pain: Secondary | ICD-10-CM

## 2012-07-04 DIAGNOSIS — R112 Nausea with vomiting, unspecified: Secondary | ICD-10-CM

## 2012-07-04 DIAGNOSIS — R197 Diarrhea, unspecified: Secondary | ICD-10-CM

## 2012-07-04 LAB — HEMOCCULT GUIAC POC 1CARD (OFFICE): Fecal Occult Blood, POC: NEGATIVE

## 2012-07-04 LAB — COMPREHENSIVE METABOLIC PANEL
AST: 21 U/L (ref 0–37)
Albumin: 3.7 g/dL (ref 3.5–5.2)
Alkaline Phosphatase: 106 U/L (ref 39–117)
BUN: 13 mg/dL (ref 6–23)
Potassium: 4.1 mEq/L (ref 3.5–5.1)
Total Bilirubin: 0.6 mg/dL (ref 0.3–1.2)

## 2012-07-04 LAB — CBC WITH DIFFERENTIAL/PLATELET
Basophils Relative: 0.2 % (ref 0.0–3.0)
Eosinophils Absolute: 0.1 10*3/uL (ref 0.0–0.7)
Lymphs Abs: 1.7 10*3/uL (ref 0.7–4.0)
MCHC: 33.2 g/dL (ref 30.0–36.0)
MCV: 81.9 fl (ref 78.0–100.0)
Monocytes Absolute: 0.6 10*3/uL (ref 0.1–1.0)
Neutrophils Relative %: 62.2 % (ref 43.0–77.0)
Platelets: 264 10*3/uL (ref 150.0–400.0)
RBC: 4.47 Mil/uL (ref 3.87–5.11)

## 2012-07-04 MED ORDER — PROMETHAZINE HCL 25 MG/ML IJ SOLN
25.0000 mg | Freq: Once | INTRAMUSCULAR | Status: AC
  Administered 2012-07-04: 25 mg via INTRAMUSCULAR

## 2012-07-04 NOTE — Assessment & Plan Note (Addendum)
Anticipate viral gastroenteritis.  Doubt divierticulitis.  Doubt GI bleed. Check CBC today to eval for leukocytosis and anemia. Treat with clear liquid diet, phenergan for nausea that she has at home, and continued rest at home. Discussed anticipated improvement over next few days.  If worsening, to seek urgent care or return to see [place]. Pt agrees with plan.

## 2012-07-04 NOTE — Patient Instructions (Addendum)
I've taking carvedilol off your medication list.  Double check to make sure you're not taking carvedilol (coreg) any more.  If you are, let us know. Blood work today. Shot of phenergan today for nausea. If worsening instead of improving, please return to see [place] or seek care. Push small sips of fluids throughout the day - most important thing to prevent dehydration. Use phenergan for nausea as needed at home.  Viral Gastroenteritis Viral gastroenteritis is also known as stomach flu. This condition affects the stomach and intestinal tract. It can cause sudden diarrhea and vomiting. The illness typically lasts 3 to 8 days. Most people develop an immune response that eventually gets rid of the virus. While this natural response develops, the virus can make you quite ill. CAUSES  Many different viruses can cause gastroenteritis, such as rotavirus or noroviruses. You can catch one of these viruses by consuming contaminated food or water. You may also catch a virus by sharing utensils or other personal items with an infected person or by touching a contaminated surface. SYMPTOMS  The most common symptoms are diarrhea and vomiting. These problems can cause a severe loss of body fluids (dehydration) and a body salt (electrolyte) imbalance. Other symptoms may include:  Fever.  Headache.  Fatigue.  Abdominal pain. DIAGNOSIS  Your caregiver can usually diagnose viral gastroenteritis based on your symptoms and a physical exam. A stool sample may also be taken to test for the presence of viruses or other infections. TREATMENT  This illness typically goes away on its own. Treatments are aimed at rehydration. The most serious cases of viral gastroenteritis involve vomiting so severely that you are not able to keep fluids down. In these cases, fluids must be given through an intravenous line (IV). HOME CARE INSTRUCTIONS   Drink enough fluids to keep your urine clear or pale yellow. Drink small amounts of  fluids frequently and increase the amounts as tolerated.  Ask your caregiver for specific rehydration instructions.  Avoid:  Foods high in sugar.  Alcohol.  Carbonated drinks.  Tobacco.  Juice.  Caffeine drinks.  Extremely hot or cold fluids.  Fatty, greasy foods.  Too much intake of anything at one time.  Dairy products until 24 to 48 hours after diarrhea stops.  You may consume probiotics. Probiotics are active cultures of beneficial bacteria. They may lessen the amount and number of diarrheal stools in adults. Probiotics can be found in yogurt with active cultures and in supplements.  Wash your hands well to avoid spreading the virus.  Only take over-the-counter or prescription medicines for pain, discomfort, or fever as directed by your caregiver. Do not give aspirin to children. Antidiarrheal medicines are not recommended.  Ask your caregiver if you should continue to take your regular prescribed and over-the-counter medicines.  Keep all follow-up appointments as directed by your caregiver. SEEK IMMEDIATE MEDICAL CARE IF:   You are unable to keep fluids down.  You do not urinate at least once every 6 to 8 hours.  You develop shortness of breath.  You notice blood in your stool or vomit. This may look like coffee grounds.  You have abdominal pain that increases or is concentrated in one small area (localized).  You have persistent vomiting or diarrhea.  You have a fever.  The patient is a child younger than 3 months, and he or she has a fever.  The patient is a child older than 3 months, and he or she has a fever and persistent symptoms.  The patient is a child older than 3 months, and he or she has a fever and symptoms suddenly get worse.  The patient is a baby, and he or she has no tears when crying. MAKE SURE YOU:   Understand these instructions.  Will watch your condition.  Will get help right away if you are not doing well or get  worse. Document Released: 02/28/2005 Document Revised: 05/23/2011 Document Reviewed: 12/15/2010 [facility] Patient Information 2013 [place], [place].

## 2012-07-04 NOTE — Addendum Note (Signed)
Addended by: Eustaquio Boyden on: 07/04/2012 10:28 AM   Modules accepted: Orders

## 2012-07-04 NOTE — Addendum Note (Signed)
Addended by: Josph Macho A on: 07/04/2012 10:35 AM   Modules accepted: Orders
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
## 2012-08-28 ENCOUNTER — Other Ambulatory Visit: Payer: Self-pay | Admitting: Family Medicine

## 2012-08-28 NOTE — Telephone Encounter (Signed)
Sent!

## 2012-08-28 NOTE — Telephone Encounter (Signed)
Received refill request electronically from pharmacy. Last office visit -acute on 07/04/12. Is it okay to refill medication?

## 2012-11-20 ENCOUNTER — Ambulatory Visit (INDEPENDENT_AMBULATORY_CARE_PROVIDER_SITE_OTHER): Payer: Medicare Other | Admitting: Family Medicine

## 2012-11-20 ENCOUNTER — Encounter: Payer: Self-pay | Admitting: Family Medicine

## 2012-11-20 VITALS — BP 142/60 | HR 61 | Temp 98.2°F | Wt 197.5 lb

## 2012-11-20 DIAGNOSIS — R109 Unspecified abdominal pain: Secondary | ICD-10-CM

## 2012-11-20 DIAGNOSIS — N390 Urinary tract infection, site not specified: Secondary | ICD-10-CM

## 2012-11-20 MED ORDER — PROMETHAZINE HCL 25 MG PO TABS: 25.0000 mg | ORAL_TABLET | Freq: Three times a day (TID) | ORAL | Status: DC | PRN

## 2012-11-20 NOTE — Patient Instructions (Addendum)
Take sips of fluids. Take promethazine for nausea along with TUMS.  Start the antibiotics that [facility] sent to the pharmacy.  This should gradually get better.  Let me know if you don't improve.  Take care.

## 2012-11-21 ENCOUNTER — Encounter: Payer: Self-pay | Admitting: Family Medicine

## 2012-11-21 DIAGNOSIS — N390 Urinary tract infection, site not specified: Secondary | ICD-10-CM | POA: Insufficient documentation

## 2012-11-21 DIAGNOSIS — R109 Unspecified abdominal pain: Secondary | ICD-10-CM | POA: Insufficient documentation

## 2012-11-21 NOTE — Assessment & Plan Note (Signed)
Per [facility] ER.

## 2012-11-21 NOTE — Assessment & Plan Note (Signed)
This is likely gerd/reflux/heartburn related to anxiety and sleep deprivation.  Would continue PPI, add on tums.  Use phenergan prn for nausea.  Would treat UTI as below and we'll address f/u of the pelvic CT if not done by [facility]. D/w pt.  She agrees. >25 min spent with face to face with patient, >50% counseling and/or coordinating care.

## 2012-11-21 NOTE — Progress Notes (Signed)
Husband at [facility] for >1 week.  She has been there the whole time.  She's worried about him.   Recently with urinary frequency.  No pain with voiding. Also with episodic nausea, vomiting.  No blood in vomitus.  No bloody stools.  No diarrhea.  Occ L sided abd pain, then it moves up the anterior mid chest to the throat. More gassy recently.  No fevers.  No exertional sx.    See at [facility] ER.  Records reviewed.  CT chest/abd/pelvis done.  Pelvic lymph nodes noted.  ucx pos.  While she was at office today, notified that [facility] called in abx, unknown name, for UTI.  D/w pt.  Gallstones noted on CT.   Meds, vitals, and allergies reviewed.   ROS: See HPI.  Otherwise, noncontributory.  nad ncat Mmm rrr Ctab abd soft, not ttp, no rebound. No cva pain Ext well perfused

## 2012-11-22 ENCOUNTER — Other Ambulatory Visit: Payer: Self-pay | Admitting: Family Medicine

## 2012-12-16 ENCOUNTER — Telehealth: Payer: Self-pay | Admitting: Family Medicine

## 2012-12-16 DIAGNOSIS — Z87898 Personal history of other specified conditions: Secondary | ICD-10-CM

## 2012-12-16 NOTE — Telephone Encounter (Signed)
Call pt.  She had pelvic CT with lymphadenopathy prev noted. Would be reasonable to recheck.  Orders are in for f/u CT scan.  Thanks

## 2012-12-17 ENCOUNTER — Telehealth: Payer: Self-pay | Admitting: Family Medicine

## 2012-12-17 ENCOUNTER — Encounter: Payer: Self-pay | Admitting: Family Medicine

## 2012-12-17 DIAGNOSIS — R9389 Abnormal findings on diagnostic imaging of other specified body structures: Secondary | ICD-10-CM

## 2012-12-17 NOTE — Telephone Encounter (Signed)
Left message on patient's voicemail to return call

## 2012-12-17 NOTE — Telephone Encounter (Signed)
CT report from [facility] pasted into EMR.  Please print and scan in.    [id number] UN 11/19/12  00:16:[id number] [facility]) : CT CHEST W CONTRAST [id number] UN 11/19/12  00:16:[id number] [facility]) : CT ABDOMEN PELVIS W CONTRAST  EXAM: CT chest with contrast with image post-processing, CT abdomen and pelvis with contrast   INTERPRETATION LOCATION:  [facility]  DICTATED: Monday, November 19, 2012 01:11:23.  CLINICAL INDICATION: 77 year-old F.   Chest Pain. Chest discomfort and vomiting after eating. Early satiety and upper abdominal pain.  TECHNIQUE: 5-mm CT images were obtained with IV contrast and with oral contrast from the lung apices to the pubic symphysis.  Thin MIP reconstructions were obtained of the chest in the coronal plane.  For all [facility] CT exams, radiation dose reduction device (automated exposure control) is used or manual techniques with radiation dose As Low As Reasonably Achievable (ALARA) protocol are followed using age and patient-size-specific scan parameters, while  maintaining the necessary diagnostic image quality.  COMPARISON: none  FINDINGS:   CHEST:  The thyroid gland demonstrates subcentimeter hypodensities within the right and left lobes.   Scattered calcifications within the thoracic aorta. Calcification within the coronary arteries. Coarse calcification along the mitral valve. No pericardial effusion.   Airways appear patent. No focal pulmonary consolidation or pleural effusion identified. No evidence of pulmonary nodules.  There are no pathologically enlarged lymph nodes in the mediastinum, hila, or axillae.    ABDOMEN/PELVIS:  Small hiatal hernia.  The contrast-enhanced pancreas, adrenals, and kidneys are within normal limits. Calcifications within the liver and spleen are likely secondary to prior granulomatous infection. Cholelithiasis.  The aorta is normal in caliber with scattered calcifications. The portal vasculature is patent.   There is no evidence of  obstruction in the large or small bowel. Sigmoid diverticulosis without evidence of diverticulitis.  The bladder is unremarkable. Calcifications within the uterus likely represent partially calcified uterine fibroids and vascular calcifications. Ovaries are identified.   1.0 right pelvic sidewall node (5:56). 1.0 cm right external iliac node (5:71).   There is no free fluid or air within the abdomen or pelvis.  BONES:  No osteolytic or osteoblastic lesions identified. Degenerative disc disease of the visualized spine.  IMPRESSION: - Small hiatal hernia. No evidence of bowel obstruction. - Cholelithiasis. - Mildly enlarged pelvic lymphadenopathy, indeterminate. Attention on followup recommended. - Subcentimeter hypodensities within the right and left lobes of the thyroid.

## 2012-12-17 NOTE — Telephone Encounter (Signed)
Cr 0.91 and BUN 22 on 11/18/12 at [facility].

## 2012-12-18 NOTE — Telephone Encounter (Signed)
Patient has already spoken to [facility] and she is getting it set up.

## 2012-12-21 ENCOUNTER — Ambulatory Visit (INDEPENDENT_AMBULATORY_CARE_PROVIDER_SITE_OTHER)
Admission: RE | Admit: 2012-12-21 | Discharge: 2012-12-21 | Disposition: A | Discharge status: Home / Self Care | Payer: Medicare Other | Source: Ambulatory Visit | Attending: Family Medicine | Admitting: Family Medicine

## 2012-12-21 DIAGNOSIS — R9389 Abnormal findings on diagnostic imaging of other specified body structures: Secondary | ICD-10-CM

## 2012-12-21 MED ORDER — IOHEXOL 300 MG/ML  SOLN
100.0000 mL | Freq: Once | INTRAMUSCULAR | Status: AC | PRN
  Administered 2012-12-21: 100 mL via INTRAVENOUS

## 2012-12-24 ENCOUNTER — Encounter: Payer: Self-pay | Admitting: *Deleted

## 2013-03-01 ENCOUNTER — Encounter: Payer: Self-pay | Admitting: Family Medicine

## 2013-03-10 ENCOUNTER — Other Ambulatory Visit: Payer: Self-pay | Admitting: Family Medicine

## 2013-03-11 NOTE — Telephone Encounter (Signed)
Electronic refill request. Patient has not been seen for a CPE in some time.  Please advise.

## 2013-03-11 NOTE — Telephone Encounter (Signed)
Given her husband's situation, this likely isn't a good time for her to come in.  I would try to get a CPE schedule for spring of 2015.  Thanks.  I sent the meds.

## 2013-03-22 ENCOUNTER — Other Ambulatory Visit: Payer: Self-pay | Admitting: *Deleted

## 2013-03-22 MED ORDER — DILTIAZEM HCL ER BEADS 240 MG PO CP24
ORAL_CAPSULE | ORAL | Status: DC
Start: 2013-03-22 — End: 2013-11-12

## 2013-03-22 MED ORDER — ATORVASTATIN CALCIUM 80 MG PO TABS: ORAL_TABLET | ORAL | Status: DC

## 2013-03-22 MED ORDER — SERTRALINE HCL 100 MG PO TABS: ORAL_TABLET | ORAL | Status: DC

## 2013-03-22 MED ORDER — FUROSEMIDE 20 MG PO TABS: 20.0000 mg | ORAL_TABLET | Freq: Every morning | ORAL | Status: DC

## 2013-03-31 ENCOUNTER — Other Ambulatory Visit: Payer: Self-pay | Admitting: Family Medicine

## 2013-03-31 ENCOUNTER — Telehealth: Payer: Self-pay | Admitting: Family Medicine

## 2013-03-31 DIAGNOSIS — Z87898 Personal history of other specified conditions: Secondary | ICD-10-CM

## 2013-03-31 NOTE — Telephone Encounter (Signed)
Please call pt.  Prev with pelvic lymphadenopathy on CT, due for f/u.  Order is in.  Rosaria Ferries should be getting in contact with her about scheduling.  Thanks.

## 2013-04-01 NOTE — Telephone Encounter (Signed)
Left message on patient's voicemail to return call

## 2013-04-01 NOTE — Telephone Encounter (Signed)
Patient advised. Patient says she is still having trouble with that area.

## 2013-04-02 ENCOUNTER — Other Ambulatory Visit (INDEPENDENT_AMBULATORY_CARE_PROVIDER_SITE_OTHER): Payer: Medicare HMO

## 2013-04-02 DIAGNOSIS — I10 Essential (primary) hypertension: Secondary | ICD-10-CM

## 2013-04-02 LAB — BASIC METABOLIC PANEL
BUN: 23 mg/dL (ref 6–23)
CO2: 28 mEq/L (ref 19–32)
Calcium: 9 mg/dL (ref 8.4–10.5)
Chloride: 106 mEq/L (ref 96–112)
Creatinine, Ser: 1.1 mg/dL (ref 0.4–1.2)
GFR: 64.25 mL/min (ref >60.00)
Glucose, Bld: 110 mg/dL — ABNORMAL HIGH (ref 70–99)
Potassium: 4 mEq/L (ref 3.5–5.1)
Sodium: 140 mEq/L (ref 135–145)

## 2013-04-05 ENCOUNTER — Ambulatory Visit (INDEPENDENT_AMBULATORY_CARE_PROVIDER_SITE_OTHER)
Admission: RE | Admit: 2013-04-05 | Discharge: 2013-04-05 | Disposition: A | Discharge status: Home / Self Care | Payer: Medicare HMO | Source: Ambulatory Visit | Attending: Family Medicine | Admitting: Family Medicine

## 2013-04-05 DIAGNOSIS — Z87898 Personal history of other specified conditions: Secondary | ICD-10-CM

## 2013-04-05 MED ORDER — IOHEXOL 300 MG/ML  SOLN
100.0000 mL | Freq: Once | INTRAMUSCULAR | Status: AC | PRN
  Administered 2013-04-05: 100 mL via INTRAVENOUS

## 2013-04-09 ENCOUNTER — Other Ambulatory Visit: Payer: Self-pay | Admitting: Family Medicine

## 2013-04-09 DIAGNOSIS — R109 Unspecified abdominal pain: Secondary | ICD-10-CM

## 2013-05-02 ENCOUNTER — Encounter: Payer: Self-pay | Admitting: Obstetrics and Gynecology

## 2013-05-02 ENCOUNTER — Ambulatory Visit (INDEPENDENT_AMBULATORY_CARE_PROVIDER_SITE_OTHER): Payer: Medicare HMO | Admitting: Obstetrics and Gynecology

## 2013-05-02 VITALS — BP 170/100 | HR 77 | Ht 66.0 in | Wt 215.0 lb

## 2013-05-02 DIAGNOSIS — R109 Unspecified abdominal pain: Secondary | ICD-10-CM
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
## 2013-05-02 NOTE — Addendum Note (Signed)
Addended by: Erik Obey on: 05/02/2013 10:48 AM   Modules accepted: Orders

## 2013-05-04 LAB — URINE CULTURE: Colony Count: 3000

## 2013-05-09 ENCOUNTER — Ambulatory Visit (HOSPITAL_COMMUNITY): Admission: RE | Admit: 2013-05-09 | Payer: Medicare HMO | Source: Ambulatory Visit

## 2013-05-09 ENCOUNTER — Ambulatory Visit (HOSPITAL_COMMUNITY): Payer: Medicare HMO

## 2013-05-15 ENCOUNTER — Ambulatory Visit (HOSPITAL_COMMUNITY)
Admission: RE | Admit: 2013-05-15 | Discharge: 2013-05-15 | Disposition: A | Discharge status: Home / Self Care | Payer: Medicare HMO | Source: Ambulatory Visit | Attending: Obstetrics and Gynecology | Admitting: Obstetrics and Gynecology

## 2013-05-15 DIAGNOSIS — R109 Unspecified abdominal pain: Secondary | ICD-10-CM | POA: Insufficient documentation

## 2013-05-15 DIAGNOSIS — N949 Unspecified condition associated with female genital organs and menstrual cycle: Secondary | ICD-10-CM | POA: Insufficient documentation

## 2013-05-17 ENCOUNTER — Encounter: Payer: Self-pay | Admitting: Obstetrics and Gynecology

## 2013-05-17 ENCOUNTER — Ambulatory Visit (INDEPENDENT_AMBULATORY_CARE_PROVIDER_SITE_OTHER): Payer: Medicare HMO | Admitting: Obstetrics and Gynecology

## 2013-05-17 VITALS — BP 155/80 | HR 58 | Ht 68.0 in | Wt 220.0 lb

## 2013-05-17 DIAGNOSIS — Z712 Person consulting for explanation of examination or test findings: Secondary | ICD-10-CM

## 2013-05-17 DIAGNOSIS — R109 Unspecified abdominal pain: Secondary | ICD-10-CM

## 2013-05-17 DIAGNOSIS — Z7189 Other specified counseling: Secondary | ICD-10-CM

## 2013-05-17 NOTE — Progress Notes (Signed)
Patient ID: [name], female   DOB: February 02, 1936, 78 y.o.   MRN: [id number] 78 yo presenting today to discuss results of pelvic ultrasound. Results reviewed and explained to the patient which were essentially normal and does not explain the etiology of her abdominal pain. Discussed the need to follow up with GI for colonoscopy. Patient admits that she thinks her pain is of GI origin as she is having 4-5 bowel movements daily. Her pain seems to intensify prior to having a bowel movement.  Will provide referral to GI.  Follow up prn

## 2013-05-20 ENCOUNTER — Telehealth: Payer: Self-pay

## 2013-05-20 DIAGNOSIS — R109 Unspecified abdominal pain: Secondary | ICD-10-CM

## 2013-05-20 NOTE — Telephone Encounter (Signed)
Pts daughter left v/m requesting referrar to GI doctor at [facility]; pt recently saw GYN doctor and was recommended pt see GI for colonoscopy and GI referral for abd pain.Please advise.

## 2013-05-20 NOTE — Telephone Encounter (Signed)
Referral is in. Thanks.

## 2013-06-12 HISTORY — PX: EYE SURGERY: SHX253

## 2013-06-26 LAB — HM COLONOSCOPY: HM Colonoscopy: NORMAL

## 2013-07-01 ENCOUNTER — Encounter: Payer: Self-pay | Admitting: Family Medicine

## 2013-09-18 ENCOUNTER — Ambulatory Visit (INDEPENDENT_AMBULATORY_CARE_PROVIDER_SITE_OTHER): Payer: Medicare HMO | Admitting: Family Medicine

## 2013-09-18 ENCOUNTER — Encounter: Payer: Self-pay | Admitting: Family Medicine

## 2013-09-18 VITALS — BP 136/70 | HR 60 | Temp 98.5°F | Wt 215.0 lb

## 2013-09-18 DIAGNOSIS — F341 Dysthymic disorder: Secondary | ICD-10-CM

## 2013-09-18 DIAGNOSIS — R05 Cough: Secondary | ICD-10-CM

## 2013-09-18 DIAGNOSIS — R059 Cough, unspecified: Secondary | ICD-10-CM

## 2013-09-18 DIAGNOSIS — Z9889 Other specified postprocedural states: Secondary | ICD-10-CM

## 2013-09-18 MED ORDER — LANSOPRAZOLE 15 MG PO CPDR: DELAYED_RELEASE_CAPSULE | ORAL | Status: DC

## 2013-09-18 MED ORDER — DOXYCYCLINE HYCLATE 100 MG PO TABS: 100.0000 mg | ORAL_TABLET | Freq: Two times a day (BID) | ORAL | Status: DC

## 2013-09-18 MED ORDER — SERTRALINE HCL 100 MG PO TABS: ORAL_TABLET | ORAL | Status: DC

## 2013-09-18 MED ORDER — DIAZEPAM 5 MG PO TABS: 2.5000 mg | ORAL_TABLET | Freq: Two times a day (BID) | ORAL | Status: DC | PRN

## 2013-09-18 NOTE — Progress Notes (Signed)
Pre visit review using our clinic review tool, if applicable. No additional management support is needed unless otherwise documented below in the visit note.  Anxiety. Prev on SSRI, still on med. Sig anxiety.  Sleep disrupted. More irritable.  Worried.  Husband dying on hospice, grim prognosis from metastatic CA.  She has counseling/hospice support, but it has been tough for here.  They've been married 53 years.   Cough.  Discolored sputum, progressive.  No fevers.  No ST.  No fevers.    L eye surgery in 06/2013, now with site infection per patient, has eye clinic f/u tomorrow.  L eye patched in the meantime.   PMH and SH reviewed  ROS: See HPI, otherwise noncontributory.  Meds, vitals, and allergies reviewed.   nad but worried, L eye patched- not removed and examined Tm wnl Nasal and OP exam wnl Neck supple, no LA rrr Scattered rhonchi but not inc in wob, no focal dec in BS abd soft Ext with no edema Tearful discussing her husband.

## 2013-09-18 NOTE — Patient Instructions (Signed)
Keep taking the sertraline for now.  Start taking valium as needed.  Take a half pill to begin with.  Take it up to twice a day if needed.  It can make you drowsy.  Start the antibiotics. Take care.  I'll be thinking about you and your family.  Glad to see you.

## 2013-09-18 NOTE — Progress Notes (Signed)
Rx called to pharmacy as instructed. 

## 2013-09-19 ENCOUNTER — Encounter: Payer: Self-pay | Admitting: Family Medicine

## 2013-09-19 DIAGNOSIS — Z9889 Other specified postprocedural states: Secondary | ICD-10-CM | POA: Insufficient documentation

## 2013-09-19 DIAGNOSIS — J019 Acute sinusitis, unspecified: Secondary | ICD-10-CM | POA: Insufficient documentation

## 2013-09-19 NOTE — Assessment & Plan Note (Signed)
Per eye clinic.  

## 2013-09-19 NOTE — Assessment & Plan Note (Signed)
At this point, would treat for bronchitis. Start doxy.  Avoid SABA with lack of wheeze and h/o anxiety.  D/w pt.  She agrees. Okay for outpatient f/u.

## 2013-09-19 NOTE — Assessment & Plan Note (Signed)
Continue SSRI, add on valium prn with sedation caution.  Continue counseling with hospice and she'll update me.  She agrees.  This is a notably difficult situation for her.  >25 minutes spent in face to face time with patient, >50% spent in counselling or coordination of care.

## 2013-09-30 ENCOUNTER — Encounter: Payer: Self-pay | Admitting: Family Medicine

## 2013-09-30 ENCOUNTER — Ambulatory Visit (INDEPENDENT_AMBULATORY_CARE_PROVIDER_SITE_OTHER): Payer: Medicare HMO | Admitting: Family Medicine

## 2013-09-30 ENCOUNTER — Telehealth: Payer: Self-pay

## 2013-09-30 VITALS — BP 120/70 | HR 72 | Temp 98.9°F | Wt 215.8 lb

## 2013-09-30 DIAGNOSIS — J0110 Acute frontal sinusitis, unspecified: Secondary | ICD-10-CM

## 2013-09-30 DIAGNOSIS — J029 Acute pharyngitis, unspecified: Secondary | ICD-10-CM

## 2013-09-30 LAB — POCT RAPID STREP A (OFFICE): RAPID STREP A SCREEN: NEGATIVE

## 2013-09-30 MED ORDER — LEVOFLOXACIN 500 MG PO TABS: 500.0000 mg | ORAL_TABLET | Freq: Every day | ORAL | Status: DC

## 2013-09-30 NOTE — Progress Notes (Signed)
BP 120/70  Pulse 72  Temp(Src) 98.9 F (37.2 C) (Oral)  Wt 215 lb 12 oz (97.864 kg)  SpO2 96%   CC: "awful sore throat" Subjective:    Patient ID: [name], female    DOB: [birth year], 78 y.o.   MRN: [id number]  HPI: [name] is a 78 y.o. female presenting on 09/30/2013 for Sore Throat   Reviewed PCP's recent note. Recently treated for bronchitis with 10d doxy course (09/19/2013). Doxy helped cough.  Now endorses 1d h/o ST that started yesterday. Some headache, R shoulder pain and chest pain worse when bending head forward.  Tmax 101 yesterday, chills, frontal headache, slight R earache and toothache. Some burning in chest. Wheezing some, along with dyspnea.  Cough has resolved. No abd pain, nausea/vomiting, PNDrainage, RN, congestion. No dysphagia or trouble opening/closing mouth, no hoarse voice. + asthma hx, never smoker no h/o COPD. Not on alb inhaler.  No sick contacts at home. Smokers at home, don't smoke outside. So far has tried corcedin for this. Took 2 tylenol at 8 am today.  Past Medical History  Diagnosis Date  . Cholelithiasis 07/1996  . Hyperlipidemia 03/1999    takes Lipitor nightly  . NSVD (normal spontaneous vaginal delivery)     x 5  . Allergy 08/26/2002    [facility] hemoptysis actually allergic rhinitis  . CAD (coronary artery disease)     1 stent  . Peripheral edema     takes Furosemide daily  . Pneumonia     hx of--as a child  . Osteoarthritis 07/1996  . Joint pain   . Joint swelling   . Back pain     pt states from knee pain  . GERD (gastroesophageal reflux disease)     takes Prevacid daily  . Urinary frequency   . Depression     takes Zoloft daily  . Primary osteoarthritis of left knee 09/23/2010    Per Dr. Mardelle Matte with [facility]   . Osteoarthritis of left knee 08/02/2011  . Hypertension 06/1999  . Osteoarthritis of right knee 11/01/2011  . Obesity (BMI 30.0-34.9) 06/21/2006    Qualifier: Diagnosis of  By: Council Mechanic MD,  Hilaria Ota   . Kidney stones   . Left leg DVT 07/2010  . Exertional dyspnea 11/03/2011  . History of gout   . Infection of total right knee replacement 12/23/2011  . Status post right total knee replacement 12/25/2011  . Group B streptococcal infection 12/25/2011     Relevant past medical, surgical, family and social history reviewed and updated as indicated.  Allergies and medications reviewed and updated. Current Outpatient Prescriptions on File Prior to Visit  Medication Sig  . Artificial Tear Ointment (ARTIFICIAL TEARS) ointment as needed.  Marland Kitchen aspirin 81 MG tablet Take 81 mg by mouth daily.    Marland Kitchen atorvastatin (LIPITOR) 80 MG tablet TAKE ONE TABLET BY MOUTH EVERY DAY  . cholecalciferol (VITAMIN D) 1000 UNITS tablet Take 1,000 Units by mouth 2 (two) times daily.    . diazepam (VALIUM) 5 MG tablet Take 0.5-1 tablets (2.5-5 mg total) by mouth every 12 (twelve) hours as needed for anxiety (sedation caution).  Marland Kitchen diltiazem (TAZTIA XT) 240 MG 24 hr capsule TAKE ONE CAPSULE BY MOUTH EVERY DAY  . docusate sodium (COLACE) 100 MG capsule Take 100 mg by mouth daily.   Marland Kitchen doxycycline (VIBRA-TABS) 100 MG tablet Take 1 tablet (100 mg total) by mouth 2 (two) times daily.  . furosemide (LASIX) 20 MG tablet  Take 1 tablet (20 mg total) by mouth every morning.  . lansoprazole (PREVACID) 15 MG capsule TAKE ONE CAPSULE BY MOUTH TWICE DAILY  . promethazine (PHENERGAN) 25 MG tablet Take 1 tablet (25 mg total) by mouth every 8 (eight) hours as needed for nausea.  . sertraline (ZOLOFT) 100 MG tablet TAKE ONE TABLET BY MOUTH EVERY DAY  . trimethoprim-polymyxin b (POLYTRIM) ophthalmic solution 1 drop 3 (three) times daily.  . vitamin B-12 (CYANOCOBALAMIN) 1000 MCG tablet Take 1,000 mcg by mouth daily.  . vitamin C (ASCORBIC ACID) 500 MG tablet Take 500 mg by mouth daily.   No current facility-administered medications on file prior to visit.    Review of Systems Per HPI unless specifically indicated above      Objective:    BP 120/70  Pulse 72  Temp(Src) 98.9 F (37.2 C) (Oral)  Wt 215 lb 12 oz (97.864 kg)  SpO2 96%  Physical Exam  Nursing note and vitals reviewed. Constitutional: She appears well-developed and well-nourished. No distress.  HENT:  Head: Normocephalic and atraumatic.  Right Ear: Hearing, tympanic membrane, external ear and ear canal normal.  Left Ear: Hearing, tympanic membrane, external ear and ear canal normal.  Nose: Mucosal edema present. No rhinorrhea. Right sinus exhibits maxillary sinus tenderness and frontal sinus tenderness. Left sinus exhibits maxillary sinus tenderness and frontal sinus tenderness.  Mouth/Throat: Uvula is midline and mucous membranes are normal. Posterior oropharyngeal edema (mild) present. No oropharyngeal exudate, posterior oropharyngeal erythema or tonsillar abscesses.  Pale nasal mucosa  Eyes: Conjunctivae and EOM are normal. Pupils are equal, round, and reactive to light. No scleral icterus.  Neck: Normal range of motion. Neck supple. No thyromegaly present.  Tender to palpation of neck but no stiffness. FROM at neck  Cardiovascular: Normal rate, regular rhythm, normal heart sounds and intact distal pulses.   No murmur heard. Pulmonary/Chest: Effort normal and breath sounds normal. No respiratory distress. She has no wheezes. She has no rales.  Lymphadenopathy:    She has no cervical adenopathy.  Skin: Skin is warm and dry. No rash noted.   Results for orders placed in visit on 09/30/13  POCT RAPID STREP A (OFFICE)      Result Value Ref Range   Rapid Strep A Screen Negative  Negative      Assessment & Plan:   Problem List Items Addressed This Visit   Acute sinusitis     Recently worsening sore throat with fever last night to 101. RST negative today Recently treated with doxy course for presumed bronchitis. No evidence of suppurative complication today - anticipate more bacterial sinusitis. Treat accordingly with levaquin 500mg  7d  course. Update if sxs persist or fail to improve. Supportive care discussed as well.     Other Visit Diagnoses   Sore throat    -  Primary    Relevant Orders       POCT rapid strep A (Completed)        Follow up plan: No Follow-up on file.

## 2013-09-30 NOTE — Telephone Encounter (Signed)
Pt said throat is more sore than ever been; started on 09/29/13; pt also continuing with non prod cough, wheezing, SOB and CP when takes deep breath or coughs, on 09/29/13 temp was 101. Has not taken temp today. Pt said she is not having heart pain but when she coughs or takes deep breath it hurts worse on rt side of chest. Pt request appt to be seen. Pt scheduled today at 11:45 with Dr Danise Mina; advised pt if condition changes or worsens prior to appt pt will cb. Some one is with pt now.

## 2013-09-30 NOTE — Progress Notes (Signed)
Pre visit review using our clinic review tool, if applicable. No additional management support is needed unless otherwise documented below in the visit note. 

## 2013-09-30 NOTE — Assessment & Plan Note (Addendum)
Recently worsening sore throat with fever last night to 101. RST negative today Recently treated with doxy course for presumed bronchitis. No evidence of suppurative complication today - anticipate more bacterial sinusitis. Treat accordingly with levaquin 500mg  7d course. Update if sxs persist or fail to improve. Supportive care discussed as well.

## 2013-09-30 NOTE — Patient Instructions (Signed)
You have a sinus infection. Take medicine as prescribed: levaquin once daily for 7 days Push fluids and plenty of rest. Nasal saline irrigation or neti pot to help drain sinuses. May use plain mucinex with plenty of fluid to help mobilize mucous. Let us know if fever >101.5, trouble opening/closing mouth, difficulty swallowing, or worsening - you may need to be seen again.

## 2013-09-30 NOTE — Telephone Encounter (Signed)
Pt seen today

## 2013-10-03 ENCOUNTER — Telehealth: Payer: Self-pay

## 2013-10-03 NOTE — Telephone Encounter (Signed)
Did she try taking a half a pill?  It would be useful to know as that may influence the choice of the replacement medicine.  Thanks.

## 2013-10-03 NOTE — Telephone Encounter (Signed)
Left detailed message on voicemail for April.

## 2013-10-03 NOTE — Telephone Encounter (Signed)
April says this reaction was with the 1/2 tablet.  April says that she was in such bad shape last evening that they gave her a tablet of Mr. Westra medication and that calmed her down a lot without those side effects and she rested well.

## 2013-10-03 NOTE — Telephone Encounter (Signed)
April pts daughter left v/m; pt took Valium for her nerves; Valium gave pt "the shakes", pt could not hold anything in her hands due to pt shaking. [facility] advised pt to stop taking Valium and contact PCP for different med. April request different med sent to [facility]. April request cb.

## 2013-10-04 MED ORDER — ALPRAZOLAM 0.5 MG PO TABS: 0.5000 mg | ORAL_TABLET | Freq: Two times a day (BID) | ORAL | Status: DC | PRN

## 2013-10-04 NOTE — Telephone Encounter (Signed)
Medication phoned to pharmacy.   LMOVM of April's (daughter) call back number that Rx had been called in.

## 2013-10-04 NOTE — Telephone Encounter (Signed)
Please call in xanax. Thanks! 

## 2013-10-08 ENCOUNTER — Encounter: Payer: Self-pay | Admitting: Family Medicine

## 2013-10-09 ENCOUNTER — Telehealth: Payer: Self-pay | Admitting: Family Medicine

## 2013-10-09 MED ORDER — BENZONATATE 200 MG PO CAPS: 200.0000 mg | ORAL_CAPSULE | Freq: Three times a day (TID) | ORAL | Status: DC | PRN

## 2013-10-09 MED ORDER — HYDROCODONE-HOMATROPINE 5-1.5 MG/5ML PO SYRP: 5.0000 mL | ORAL_SOLUTION | Freq: Two times a day (BID) | ORAL | Status: DC | PRN

## 2013-10-09 NOTE — Addendum Note (Signed)
Addended by: [name] on: 10/09/2013 02:58 PM   Modules accepted: Orders

## 2013-10-09 NOTE — Telephone Encounter (Signed)
Patient notified as instructed by telephone and advised her that script is up front ready for pickup.

## 2013-10-09 NOTE — Telephone Encounter (Signed)
Please call pt. I just tried to call her but couldn't get through.   Please see which med she was talking about- she has been on several, doxy/levaquin etc. I am glad the anxiety med has helped.  I'm profoundly sorry for her loss.  I have been and will continue to be thinking about her an her family.   Thanks.

## 2013-10-09 NOTE — Telephone Encounter (Signed)
Patient called.  Her husband passed away on early June 21, 2022 morning.  Patient's throat is still sore and she's still coughing.  Patient said when she first started the medication you gave her for the cough it worked.  Patient's pharmacy is [facility] on [address]. Patient said the medication for anxiety is working.

## 2013-10-09 NOTE — Telephone Encounter (Signed)
Patient notified as instructed by telephone. Was advised by patient that she has been on two different antibiotics and has finished taking both. Patient stated that she still has a sore throat, low grade fever and non-productive cough. Patient stated that she really needs something especially for the cough. Pharmacy [place].

## 2013-10-09 NOTE — Telephone Encounter (Signed)
Tessalon sent, hycodan printed- sedation caution.  Use hycodan and tesslon doesn't help. Thanks.

## 2013-11-12 ENCOUNTER — Ambulatory Visit (INDEPENDENT_AMBULATORY_CARE_PROVIDER_SITE_OTHER)
Admission: RE | Admit: 2013-11-12 | Discharge: 2013-11-12 | Disposition: A | Discharge status: Home / Self Care | Payer: Medicare HMO | Source: Ambulatory Visit | Attending: Family Medicine | Admitting: Family Medicine

## 2013-11-12 ENCOUNTER — Ambulatory Visit (INDEPENDENT_AMBULATORY_CARE_PROVIDER_SITE_OTHER): Payer: Medicare HMO | Admitting: Family Medicine

## 2013-11-12 ENCOUNTER — Encounter: Payer: Self-pay | Admitting: Family Medicine

## 2013-11-12 VITALS — BP 142/72 | HR 62 | Temp 98.3°F | Wt 214.5 lb

## 2013-11-12 DIAGNOSIS — F341 Dysthymic disorder: Secondary | ICD-10-CM

## 2013-11-12 DIAGNOSIS — R05 Cough: Secondary | ICD-10-CM

## 2013-11-12 DIAGNOSIS — R059 Cough, unspecified: Secondary | ICD-10-CM

## 2013-11-12 MED ORDER — ALPRAZOLAM 0.5 MG PO TABS: 0.5000 mg | ORAL_TABLET | Freq: Three times a day (TID) | ORAL | Status: DC | PRN

## 2013-11-12 MED ORDER — DILTIAZEM HCL ER BEADS 240 MG PO CP24: ORAL_CAPSULE | ORAL | Status: DC

## 2013-11-12 MED ORDER — LANSOPRAZOLE 15 MG PO CPDR: DELAYED_RELEASE_CAPSULE | ORAL | Status: DC

## 2013-11-12 MED ORDER — FUROSEMIDE 20 MG PO TABS: 20.0000 mg | ORAL_TABLET | Freq: Every morning | ORAL | Status: DC

## 2013-11-12 NOTE — Patient Instructions (Signed)
Don't change your regular meds other than the xanax- try taking it 3 times a day as needed. Get that filled locally- I printed off the prescription.  Call hospice about getting counseling.  Go to the lab on the way out.  We'll contact you with your xray report. I sent the other meds to the mail order pharmacy.

## 2013-11-12 NOTE — Progress Notes (Signed)
Pre visit review using our clinic review tool, if applicable. No additional management support is needed unless otherwise documented below in the visit note.  Grief, anxiety. No SI/HI.  D/w pt about f/u with Hospice.  She hasn't talked with them much yet.  She did tell her husband she loved him, before he passed.  He did the same with her.  She is glad they talked before he passed.  Xanax helps, the effect is temporary.  Still on SSRI.  No ADE on meds.    ST.  Prev with cough and likely sinusitis.  Off PPI for months now. I didn't know she was out of medicine. No fevers.  Still with some cough, worse last night.  occ sputum. Unclear if ST is from cough or from GERD.   Meds, vitals, and allergies reviewed.   ROS: See HPI.  Otherwise, noncontributory.  nad but tired appearing, cries talking about her husband, consolable. Mmm, OP w/o erythema Neck supple, no LA rrr Ctab, no focal dec in BS, no wheeze abd soft, not ttp Ext w/o edema

## 2013-11-13 DIAGNOSIS — R05 Cough: Secondary | ICD-10-CM | POA: Insufficient documentation

## 2013-11-13 DIAGNOSIS — R059 Cough, unspecified: Secondary | ICD-10-CM | POA: Insufficient documentation

## 2013-11-13 NOTE — Assessment & Plan Note (Addendum)
Exacerbated by grief, continue SSRI, inc xanax to tid prn and call back as needed.  She'll f/u with hospice re: counseling.  Okay for outpatient f/u.  >25 minutes spent in face to face time with patient, >50% spent in counselling or coordination of care.

## 2013-11-13 NOTE — Assessment & Plan Note (Signed)
Likely from GERD as CXR neg. No fevers.  Would restart PPI and notify us if not better. Okay for outpatient f/u.

## 2014-01-30 ENCOUNTER — Encounter: Payer: Self-pay | Admitting: Family Medicine

## 2014-01-30 ENCOUNTER — Ambulatory Visit (INDEPENDENT_AMBULATORY_CARE_PROVIDER_SITE_OTHER): Payer: Commercial Managed Care - HMO | Admitting: Family Medicine

## 2014-01-30 VITALS — BP 142/80 | HR 62 | Temp 97.8°F | Wt 213.8 lb

## 2014-01-30 DIAGNOSIS — E785 Hyperlipidemia, unspecified: Secondary | ICD-10-CM

## 2014-01-30 DIAGNOSIS — Z23 Encounter for immunization: Secondary | ICD-10-CM

## 2014-01-30 DIAGNOSIS — L03031 Cellulitis of right toe: Secondary | ICD-10-CM

## 2014-01-30 DIAGNOSIS — F341 Dysthymic disorder: Secondary | ICD-10-CM

## 2014-01-30 LAB — COMPREHENSIVE METABOLIC PANEL
ALT: 10 U/L (ref 0–35)
AST: 16 U/L (ref 0–37)
Albumin: 3.8 g/dL (ref 3.5–5.2)
Alkaline Phosphatase: 99 U/L (ref 39–117)
BUN: 14 mg/dL (ref 6–23)
CALCIUM: 8.8 mg/dL (ref 8.4–10.5)
CHLORIDE: 101 meq/L (ref 96–112)
CO2: 28 mEq/L (ref 19–32)
Creatinine, Ser: 1 mg/dL (ref 0.4–1.2)
GFR: 71.79 mL/min (ref >60.00)
Glucose, Bld: 82 mg/dL (ref 70–99)
POTASSIUM: 3.5 meq/L (ref 3.5–5.1)
Sodium: 138 mEq/L (ref 135–145)
Total Bilirubin: 0.8 mg/dL (ref 0.2–1.2)
Total Protein: 8 g/dL (ref 6.0–8.3)

## 2014-01-30 LAB — LDL CHOLESTEROL, DIRECT: Direct LDL: 94.7 mg/dL

## 2014-01-30 MED ORDER — ALPRAZOLAM 0.5 MG PO TABS: 0.5000 mg | ORAL_TABLET | Freq: Three times a day (TID) | ORAL | Status: DC | PRN

## 2014-01-30 MED ORDER — DOXYCYCLINE HYCLATE 100 MG PO TABS: 100.0000 mg | ORAL_TABLET | Freq: Two times a day (BID) | ORAL | Status: DC

## 2014-01-30 NOTE — Patient Instructions (Addendum)
Go to the lab on the way out.  We'll contact you with your lab report.  Try taking 2 of the xanax at a time if needed.  See if that helps.  Try to use 4 pills or less during a day.  Start the antibiotics for your right 3rd toe.  Take care.  Glad to see you.

## 2014-01-30 NOTE — Progress Notes (Signed)
Pre visit review using our clinic review tool, if applicable. No additional management support is needed unless otherwise documented below in the visit note.  R 3rd toe painful, at the proximal nail bed.  No trauma.  No fevers.  Not draining.    Anxiety/"getting shaky."  No SI/HI. "I just want to go some place."  Still tearful.  Noted husband's death.   She had to put her daughter and her daughter's family out ("We had to get a lawyer"), they were living with her until then.  She feels safe at home now.  She had continued on baseline meds, with some relief from the xanax, w/o ADE.  The effect wasn't strong enough with some doses.   Meds, vitals, and allergies reviewed.   ROS: See HPI.  Otherwise, noncontributory.  Nad, tearful, but speech at baseline.  ncat Mmm rrr Ctab abd soft Ext w/o edema R foot with normal inspection except for area of cellulitis w/o fluctuance proximal to the 3rd nail, local involvement of skin only, no erythema spreading proximally.

## 2014-01-31 DIAGNOSIS — L0390 Cellulitis, unspecified: Secondary | ICD-10-CM | POA: Insufficient documentation

## 2014-01-31 NOTE — Assessment & Plan Note (Signed)
We drew labs today, since she was due.  See notes on labs.

## 2014-01-31 NOTE — Assessment & Plan Note (Signed)
Small area, not a true paronychia that would need draining, start abx and f/u prn. She agrees.

## 2014-01-31 NOTE — Assessment & Plan Note (Signed)
She felt better after talking.  She has had a lot of upheaval.  D/w pt.  Would continue SSRI, inc BZD to a 4th pill per day if needed, ie 2-1-1 dosing, d/w pt.  She agrees.  rx called in.  No SI/HI, okay for outpatient f/u.  >25 minutes spent in face to face time with patient, >50% spent in counselling or coordination of care.

## 2014-02-03 ENCOUNTER — Encounter: Payer: Self-pay | Admitting: *Deleted

## 2014-02-04 ENCOUNTER — Observation Stay: Payer: Self-pay | Admitting: Orthopedic Surgery

## 2014-02-04 ENCOUNTER — Emergency Department: Payer: Self-pay | Admitting: Emergency Medicine

## 2014-02-04 LAB — CBC WITH DIFFERENTIAL/PLATELET
Basophil #: 0.1 10*3/uL (ref 0.0–0.1)
Basophil %: 0.6 %
EOS ABS: 0.1 10*3/uL (ref 0.0–0.7)
Eosinophil %: 1.2 %
HCT: 39.1 % (ref 35.0–47.0)
HGB: 12.4 g/dL (ref 12.0–16.0)
Lymphocyte #: 2.1 10*3/uL (ref 1.0–3.6)
Lymphocyte %: 22.7 %
MCH: 27.7 pg (ref 26.0–34.0)
MCHC: 31.7 g/dL — AB (ref 32.0–36.0)
MCV: 87 fL (ref 80–100)
Monocyte #: 0.8 x10 3/mm (ref 0.2–0.9)
Monocyte %: 8.6 %
Neutrophil #: 6.2 10*3/uL (ref 1.4–6.5)
Neutrophil %: 66.9 %
Platelet: 213 10*3/uL (ref 150–440)
RBC: 4.47 10*6/uL (ref 3.80–5.20)
RDW: 15.4 % — ABNORMAL HIGH (ref 11.5–14.5)
WBC: 9.3 10*3/uL (ref 3.6–11.0)

## 2014-02-04 LAB — BASIC METABOLIC PANEL
ANION GAP: 6 — AB (ref 7–16)
BUN: 16 mg/dL (ref 7–18)
CALCIUM: 8.2 mg/dL — AB (ref 8.5–10.1)
Chloride: 108 mmol/L — ABNORMAL HIGH (ref 98–107)
Co2: 29 mmol/L (ref 21–32)
Creatinine: 0.99 mg/dL (ref 0.60–1.30)
EGFR (African American): >60
EGFR (Non-African Amer.): 58 — ABNORMAL LOW
Glucose: 84 mg/dL (ref 65–99)
Osmolality: 285 (ref 275–301)
Potassium: 3.9 mmol/L (ref 3.5–5.1)
SODIUM: 143 mmol/L (ref 136–145)

## 2014-02-06 ENCOUNTER — Emergency Department: Payer: Self-pay | Admitting: Emergency Medicine

## 2014-02-06 LAB — CBC
HCT: 37.4 % (ref 35.0–47.0)
HGB: 12.1 g/dL (ref 12.0–16.0)
MCH: 28.3 pg (ref 26.0–34.0)
MCHC: 32.2 g/dL (ref 32.0–36.0)
MCV: 88 fL (ref 80–100)
Platelet: 224 10*3/uL (ref 150–440)
RBC: 4.26 10*6/uL (ref 3.80–5.20)
RDW: 15.5 % — ABNORMAL HIGH (ref 11.5–14.5)
WBC: 10.1 10*3/uL (ref 3.6–11.0)

## 2014-02-06 LAB — BASIC METABOLIC PANEL
Anion Gap: 3 — ABNORMAL LOW (ref 7–16)
BUN: 14 mg/dL (ref 7–18)
CO2: 31 mmol/L (ref 21–32)
CREATININE: 1 mg/dL (ref 0.60–1.30)
Calcium, Total: 8.8 mg/dL (ref 8.5–10.1)
Chloride: 101 mmol/L (ref 98–107)
EGFR (Non-African Amer.): 57 — ABNORMAL LOW
GLUCOSE: 112 mg/dL — AB (ref 65–99)
Osmolality: 271 (ref 275–301)
Potassium: 3.4 mmol/L — ABNORMAL LOW (ref 3.5–5.1)
Sodium: 135 mmol/L — ABNORMAL LOW (ref 136–145)

## 2014-03-19 ENCOUNTER — Encounter: Payer: Self-pay | Admitting: Orthopedic Surgery

## 2014-03-19 DIAGNOSIS — Z8781 Personal history of (healed) traumatic fracture: Secondary | ICD-10-CM | POA: Diagnosis not present

## 2014-03-19 DIAGNOSIS — Z967 Presence of other bone and tendon implants: Secondary | ICD-10-CM | POA: Diagnosis not present

## 2014-03-19 DIAGNOSIS — M6281 Muscle weakness (generalized): Secondary | ICD-10-CM | POA: Diagnosis not present

## 2014-03-19 DIAGNOSIS — M25832 Other specified joint disorders, left wrist: Secondary | ICD-10-CM | POA: Diagnosis not present

## 2014-03-19 DIAGNOSIS — M25532 Pain in left wrist: Secondary | ICD-10-CM | POA: Diagnosis not present

## 2014-03-21 DIAGNOSIS — Z967 Presence of other bone and tendon implants: Secondary | ICD-10-CM | POA: Diagnosis not present

## 2014-03-21 DIAGNOSIS — M25832 Other specified joint disorders, left wrist: Secondary | ICD-10-CM | POA: Diagnosis not present

## 2014-03-21 DIAGNOSIS — M25532 Pain in left wrist: Secondary | ICD-10-CM | POA: Diagnosis not present

## 2014-03-21 DIAGNOSIS — Z8781 Personal history of (healed) traumatic fracture: Secondary | ICD-10-CM | POA: Diagnosis not present

## 2014-03-21 DIAGNOSIS — M6281 Muscle weakness (generalized): Secondary | ICD-10-CM | POA: Diagnosis not present

## 2014-03-25 DIAGNOSIS — Z967 Presence of other bone and tendon implants: Secondary | ICD-10-CM | POA: Diagnosis not present

## 2014-03-25 DIAGNOSIS — M25532 Pain in left wrist: Secondary | ICD-10-CM | POA: Diagnosis not present

## 2014-03-25 DIAGNOSIS — M25832 Other specified joint disorders, left wrist: Secondary | ICD-10-CM | POA: Diagnosis not present

## 2014-03-25 DIAGNOSIS — Z8781 Personal history of (healed) traumatic fracture: Secondary | ICD-10-CM | POA: Diagnosis not present

## 2014-03-25 DIAGNOSIS — M6281 Muscle weakness (generalized): Secondary | ICD-10-CM | POA: Diagnosis not present

## 2014-03-27 DIAGNOSIS — M25832 Other specified joint disorders, left wrist: Secondary | ICD-10-CM | POA: Diagnosis not present

## 2014-03-27 DIAGNOSIS — Z8781 Personal history of (healed) traumatic fracture: Secondary | ICD-10-CM | POA: Diagnosis not present

## 2014-03-27 DIAGNOSIS — M6281 Muscle weakness (generalized): Secondary | ICD-10-CM | POA: Diagnosis not present

## 2014-03-27 DIAGNOSIS — M25532 Pain in left wrist: Secondary | ICD-10-CM | POA: Diagnosis not present

## 2014-03-27 DIAGNOSIS — Z967 Presence of other bone and tendon implants: Secondary | ICD-10-CM | POA: Diagnosis not present

## 2014-03-31 DIAGNOSIS — Z967 Presence of other bone and tendon implants: Secondary | ICD-10-CM | POA: Diagnosis not present

## 2014-03-31 DIAGNOSIS — Z8781 Personal history of (healed) traumatic fracture: Secondary | ICD-10-CM | POA: Diagnosis not present

## 2014-03-31 DIAGNOSIS — M6281 Muscle weakness (generalized): Secondary | ICD-10-CM | POA: Diagnosis not present

## 2014-03-31 DIAGNOSIS — M25832 Other specified joint disorders, left wrist: Secondary | ICD-10-CM | POA: Diagnosis not present

## 2014-03-31 DIAGNOSIS — M25532 Pain in left wrist: Secondary | ICD-10-CM | POA: Diagnosis not present

## 2014-04-07 ENCOUNTER — Other Ambulatory Visit: Payer: Self-pay | Admitting: *Deleted

## 2014-04-07 MED ORDER — FUROSEMIDE 20 MG PO TABS: 20.0000 mg | ORAL_TABLET | Freq: Every morning | ORAL | Status: DC

## 2014-04-07 MED ORDER — DILTIAZEM HCL ER BEADS 240 MG PO CP24: ORAL_CAPSULE | ORAL | Status: DC

## 2014-04-14 ENCOUNTER — Encounter: Payer: Self-pay | Admitting: Orthopedic Surgery

## 2014-05-19 ENCOUNTER — Ambulatory Visit: Payer: Commercial Managed Care - HMO | Admitting: Family Medicine

## 2014-06-23 ENCOUNTER — Other Ambulatory Visit: Payer: Self-pay

## 2014-06-23 MED ORDER — ATORVASTATIN CALCIUM 80 MG PO TABS: ORAL_TABLET | ORAL | Status: DC

## 2014-06-23 NOTE — Telephone Encounter (Signed)
Pt left v/m requesting refill cholesterol med to [facility] is out of med. Pt request cb. Unable to reach pt by phone. Pt last refilled atorvastatin # 90 on 03/22/2013.Please advise. Pt last seen 01/30/14.

## 2014-06-23 NOTE — Telephone Encounter (Signed)
Sent. Thanks.   

## 2014-06-24 ENCOUNTER — Ambulatory Visit (INDEPENDENT_AMBULATORY_CARE_PROVIDER_SITE_OTHER): Payer: Medicare Other | Admitting: Family Medicine

## 2014-06-24 ENCOUNTER — Encounter: Payer: Self-pay | Admitting: Family Medicine

## 2014-06-24 VITALS — BP 150/76 | HR 64 | Temp 98.3°F | Wt 199.2 lb

## 2014-06-24 DIAGNOSIS — R05 Cough: Secondary | ICD-10-CM | POA: Diagnosis not present

## 2014-06-24 DIAGNOSIS — R059 Cough, unspecified: Secondary | ICD-10-CM

## 2014-06-24 MED ORDER — HYDROCODONE-HOMATROPINE 5-1.5 MG/5ML PO SYRP: 5.0000 mL | ORAL_SOLUTION | Freq: Three times a day (TID) | ORAL | Status: DC | PRN

## 2014-06-24 MED ORDER — DOXYCYCLINE HYCLATE 100 MG PO TABS: 100.0000 mg | ORAL_TABLET | Freq: Two times a day (BID) | ORAL | Status: DC

## 2014-06-24 NOTE — Progress Notes (Signed)
Pre visit review using our clinic review tool, if applicable. No additional management support is needed unless otherwise documented below in the visit note.  Sick for the last week, sweats, cough, chills. "In the bed for the last week."  Continues to have discolored sputum and rhinorrhea.   "I don't know where it's all coming from" meaning so much sputum. Some wheeze noted recently.     Meds, vitals, and allergies reviewed.   ROS: See HPI.  Otherwise, noncontributory.  GEN: nad, alert and oriented HEENT: mucous membranes moist, tm w/o erythema, nasal exam w/o erythema, clear discharge noted,  OP with cobblestoning NECK: supple w/o LA CV: rrr.   PULM: no inc wob but cough noted and diffuse exp B rhonchi EXT: no edema SKIN: no acute rash

## 2014-06-24 NOTE — Patient Instructions (Signed)
The cough medicine can make you drowsy.  Start doxycycline in the meantime.  Update me as needed.   You likely have bronchitis.  Schedule a physical when you get to feeling better.

## 2014-06-26 NOTE — Assessment & Plan Note (Signed)
Still okay for outpatient f/u.   Sedation caution on cough medicine.  Start doxy in meantime.  Rest and fluids, update me as needed.  She agrees.

## 2014-07-05 NOTE — Op Note (Signed)
PATIENT NAMEKAYLYNE, [name] MR#:  [id number] DATE OF BIRTH:  11-02-1935  DATE OF PROCEDURE:  02/04/2014  PREOPERATIVE DIAGNOSIS:  Comminuted intra-articular left distal radius fracture with acute carpal tunnel syndrome.   POSTOPERATIVE DIAGNOSIS:  Comminuted intra-articular left distal radius fracture with acute carpal tunnel syndrome.   PROCEDURE:  Left carpal tunnel release and open reduction and internal fixation of left distal radius.   ANESTHESIA:  General.   SURGEON:  Laurene Footman, MD   DESCRIPTION OF PROCEDURE:  The patient was brought to the operating room, and after adequate anesthesia was obtained, the left arm was prepped and draped in the usual sterile fashion with a tourniquet applied to the upper arm. After patient identification and timeout procedures were completed, the tourniquet was raised to 250 mmHg, and traction was applied to the ring and middle fingers with nearly 10 pounds of traction, approximately 9-3/4 pounds of traction applied. While traction was allowed to assist and aid in indirect reduction, a volar approach was made centered over the FCR tendon. The tendon was incised and the deep fascia incised as well after retracting the tendon radially to protect the radial artery and associated veins. The pronator had been stripped off the bone and did not require further elevation. With the traction applied and gentle reduction with an elevator, the volar fragment could be brought forward, and with slight wrist flexion, essentially anatomic alignment of the distal radius was obtained. A short narrow DVR plate was applied in the appropriate location, and the distal screws were placed with the wrist held in appropriate position with a K wire initially holding this position. Drilling, measuring, and placing the smooth pegs was carried out. Following this, the 3 proximal screw holes were filled with 10 mm cortical screws. Traction was removed, and under fluoroscopic views, the  fracture appeared to be well reduced with restoration of length, radial inclination, and volar tilt with near-anatomic restoration of the joint surface. It was stable under range of motion with fluoroscopy. Next, this was irrigated and a small wet sponge placed as the carpal tunnel was addressed. A volar incision was made in line with the ring metacarpal approximately 2.5 cm in length. The skin and subcutaneous tissue were divided and the transverse carpal ligament identified and incised. The hemostat was placed underneath this, and dissection was carried out proximally initially and then distally giving decompression of the median nerve. There did appear to be compression of the most proximal extent at the level of the wrist flexion crease with release of pressure at that level. This wound was also irrigated, and 10 mL of 0.5% Sensorcaine without epinephrine were infiltrated for postoperative analgesia. The proximal incision was closed with 3-0 Vicryl subcutaneously and 4-0 nylon and 4-0 nylon for the carpal tunnel incision. Xeroform, 4 x 4's, Webril, and a volar splint followed by an Ace wrap. The patient was then sent to the recovery room in stable condition.   TOURNIQUET TIME:  43 minutes at 250 mmHg.   COMPLICATIONS:  None.   SPECIMEN:  None.   IMPLANT:  Short narrow left DVR plate with screws and smooth pegs.     ____________________________ Laurene Footman, MD mjm:nb D: 02/04/2014 21:47:46 ET T: 02/04/2014 22:28:58 ET JOB#: [id number]  cc: Laurene Footman, MD, <Dictator> Laurene Footman MD ELECTRONICALLY SIGNED 02/05/2014 0:40

## 2014-11-13 ENCOUNTER — Ambulatory Visit (INDEPENDENT_AMBULATORY_CARE_PROVIDER_SITE_OTHER): Payer: Commercial Managed Care - HMO | Admitting: Family Medicine

## 2014-11-13 ENCOUNTER — Encounter: Payer: Self-pay | Admitting: Family Medicine

## 2014-11-13 VITALS — BP 132/72 | HR 68 | Temp 98.1°F | Wt 211.0 lb

## 2014-11-13 DIAGNOSIS — M170 Bilateral primary osteoarthritis of knee: Secondary | ICD-10-CM | POA: Insufficient documentation

## 2014-11-13 DIAGNOSIS — Z23 Encounter for immunization: Secondary | ICD-10-CM | POA: Diagnosis not present

## 2014-11-13 DIAGNOSIS — E785 Hyperlipidemia, unspecified: Secondary | ICD-10-CM | POA: Diagnosis not present

## 2014-11-13 DIAGNOSIS — F341 Dysthymic disorder: Secondary | ICD-10-CM

## 2014-11-13 DIAGNOSIS — M899 Disorder of bone, unspecified: Secondary | ICD-10-CM | POA: Diagnosis not present

## 2014-11-13 DIAGNOSIS — I10 Essential (primary) hypertension: Secondary | ICD-10-CM

## 2014-11-13 MED ORDER — ALPRAZOLAM 0.5 MG PO TABS: 0.5000 mg | ORAL_TABLET | Freq: Three times a day (TID) | ORAL | Status: DC | PRN

## 2014-11-13 MED ORDER — SERTRALINE HCL 100 MG PO TABS: ORAL_TABLET | ORAL | Status: DC

## 2014-11-13 MED ORDER — FUROSEMIDE 20 MG PO TABS: 20.0000 mg | ORAL_TABLET | Freq: Every day | ORAL | Status: DC | PRN

## 2014-11-13 NOTE — Assessment & Plan Note (Signed)
Continue meds as is, with lasix only prn.  She agrees.  Will get labs at f/u this fall.  She agrees.  >25 minutes spent in face to face time with patient, >50% spent in counselling or coordination of care.

## 2014-11-13 NOTE — Progress Notes (Signed)
Pre visit review using our clinic review tool, if applicable. No additional management support is needed unless otherwise documented below in the visit note.  Anxiety.  She has tried to come off BZD but still needs to restart.  She is better than prev but still not fully through her grief.  No SI/HI.  She has been working in her yard.  She isn't as tearful as prev.   Still on SSRI.  She has been having some mild headaches, she attributed it to worry re: finances.  Her husband used to handle the finances.  She is still getting by.    She is worried about her granddaughter having SZ disorder.    Her knees are "good" and she is happy about that.  Gait is much improved.  She is working on her weight, d/w pt.    Hypertension:    Using medication without problems or lightheadedness: yes Chest pain with exertion:no Edema:no Short of breath:no Hasn't needed lasix recently.    Meds, vitals, and allergies reviewed.   ROS: See HPI.  Otherwise, noncontributory.  GEN: nad, alert and oriented, tearful discussing her late husband but regains composure.   Speech wnl.  HEENT: mucous membranes moist NECK: supple w/o LA CV: rrr.  PULM: ctab, no inc wob ABD: soft, +bs EXT: no edema SKIN: no acute rash

## 2014-11-13 NOTE — Patient Instructions (Addendum)
Continue sertraline, 1 tab a day.  Take alprazolam as needed for anxiety, if you have a panic episode.  Use furosemide only if needed for swelling.  Recheck labs before a visit in a few months.  Take care.  Glad to see you.

## 2014-11-13 NOTE — Assessment & Plan Note (Signed)
Grief improved but not resolved, continue ssri, restart prn bzd, she has not had it to use recently but would have likely benefited from use, per patient and granddaughter.  No ade on meds.  Okay for outpatient f/u.

## 2014-12-10 ENCOUNTER — Telehealth: Payer: Self-pay | Admitting: *Deleted

## 2014-12-10 NOTE — Telephone Encounter (Signed)
Received a letter from [facility] advising that Alprazolam as prescribed is not covered under patient's plan. After doing some research found out that patient has to order this thru mail order for it to be covered. Called patient to advise her of this and was advised that as of last week she no longer has [employer] and her insurance agent has changed her back to [employer]. Patient stated to disregard the letter and she has already picked up medication up at the pharmacy.

## 2014-12-11 NOTE — Telephone Encounter (Signed)
Noted, thanks!

## 2015-01-25 ENCOUNTER — Other Ambulatory Visit: Payer: Self-pay | Admitting: Family Medicine

## 2015-01-28 ENCOUNTER — Encounter: Payer: Self-pay | Admitting: Family Medicine

## 2015-01-28 ENCOUNTER — Ambulatory Visit (INDEPENDENT_AMBULATORY_CARE_PROVIDER_SITE_OTHER): Payer: Medicare Other | Admitting: Family Medicine

## 2015-01-28 VITALS — BP 136/74 | HR 60 | Temp 98.9°F | Wt 220.5 lb

## 2015-01-28 DIAGNOSIS — R197 Diarrhea, unspecified: Secondary | ICD-10-CM

## 2015-01-28 MED ORDER — LOPERAMIDE HCL 2 MG PO TABS: 2.0000 mg | ORAL_TABLET | Freq: Every day | ORAL | Status: DC | PRN

## 2015-01-28 NOTE — Progress Notes (Signed)
Pre visit review using our clinic review tool, if applicable. No additional management support is needed unless otherwise documented below in the visit note.  Recurrent episodic diarrhea.  Going on for the last ~18 months.  Some stools are normal, between episodes.   No blood in stool.  No vomiting. No fevers.  No abd pain except for cramping before a BM, after eating.  She can have an episode of recurrent diarrhea that last 2-3 days.   Isn't losing weight.   No new meds overall. Is taking pepto bismol occ.  Hasn't tried imodium.   No clear trigger foods.   No one else is sick.   City water.  Not well water.  She had colonoscopy 06/2013.   Path:  A: Colon, random, biopsy  - Colonic mucosa with no significant pathologic abnormalities   B: Colon, biopsy - Adenomatous polyp (multiple fragments)   CT abd 2015 prev with mild colonic diverticulosis without findings to suggest acute diverticulitis at this time. Also with cholelithiasis without evidence to suggest acute cholecystitis at this time.  PMH and SH reviewed  ROS: See HPI, otherwise noncontributory.  Meds, vitals, and allergies reviewed.   GEN: nad, alert and oriented HEENT: mucous membranes moist NECK: supple w/o LA CV: rrr.  PULM: ctab, no inc wob ABD: soft, +bs, slightly ttp in a horizontal band across the abd, but no rebound or tenderness o/w.   EXT: no edema

## 2015-01-28 NOTE — Patient Instructions (Signed)
Take imodium once a day as needed for the next few days for stomach upset and diarrhea.  See if that helps. If not, then let me know.   I'll check back on your old records in the meantime.  We may have to set you up with the GI clinic if not any better.  Take care.  Glad to see you.

## 2015-01-29 ENCOUNTER — Encounter: Payer: Self-pay | Admitting: Family Medicine

## 2015-01-29 DIAGNOSIS — K529 Noninfective gastroenteritis and colitis, unspecified: Secondary | ICD-10-CM | POA: Insufficient documentation

## 2015-01-29 DIAGNOSIS — R197 Diarrhea, unspecified: Secondary | ICD-10-CM | POA: Insufficient documentation

## 2015-01-29 NOTE — Assessment & Plan Note (Signed)
Intermittent but chronic sx.  No red flag sx, ie blood in stool. Not losing weight.  Still has some normal BMs. W/u w/o sig abnormality prev, see below.  No clear triggers.  Not likely to be infectious.  More likely a functional issue.  Would try imodium prn in the meantime. If she isn't improved, we may have to get her to see GI.  She agrees.  She'll update me.  >25 minutes spent in face to face time with patient, >50% spent in counselling or coordination of care.   She had colonoscopy 06/2013.   Path:  A: Colon, random, biopsy  - Colonic mucosa with no significant pathologic abnormalities   B: Colon, biopsy - Adenomatous polyp (multiple fragments)   CT abd 2015 prev with mild colonic diverticulosis without findings to suggest acute diverticulitis at this time. Also with cholelithiasis without evidence to suggest acute cholecystitis at this time.

## 2015-02-17 ENCOUNTER — Encounter: Payer: Self-pay | Admitting: *Deleted

## 2015-02-17 ENCOUNTER — Emergency Department: Payer: Medicare Other

## 2015-02-17 ENCOUNTER — Observation Stay
Admission: EM | Admit: 2015-02-17 | Discharge: 2015-02-19 | Disposition: A | Discharge status: Home / Self Care | Payer: Medicare Other | Attending: Specialist | Admitting: Specialist

## 2015-02-17 DIAGNOSIS — I214 Non-ST elevation (NSTEMI) myocardial infarction: Secondary | ICD-10-CM | POA: Diagnosis not present

## 2015-02-17 DIAGNOSIS — R109 Unspecified abdominal pain: Secondary | ICD-10-CM | POA: Insufficient documentation

## 2015-02-17 DIAGNOSIS — J9811 Atelectasis: Secondary | ICD-10-CM | POA: Insufficient documentation

## 2015-02-17 DIAGNOSIS — I110 Hypertensive heart disease with heart failure: Secondary | ICD-10-CM | POA: Diagnosis not present

## 2015-02-17 DIAGNOSIS — R079 Chest pain, unspecified: Secondary | ICD-10-CM | POA: Diagnosis present

## 2015-02-17 DIAGNOSIS — Z9841 Cataract extraction status, right eye: Secondary | ICD-10-CM | POA: Insufficient documentation

## 2015-02-17 DIAGNOSIS — R197 Diarrhea, unspecified: Secondary | ICD-10-CM | POA: Diagnosis not present

## 2015-02-17 DIAGNOSIS — R748 Abnormal levels of other serum enzymes: Secondary | ICD-10-CM | POA: Diagnosis not present

## 2015-02-17 DIAGNOSIS — I200 Unstable angina: Secondary | ICD-10-CM | POA: Diagnosis not present

## 2015-02-17 DIAGNOSIS — M1990 Unspecified osteoarthritis, unspecified site: Secondary | ICD-10-CM | POA: Diagnosis not present

## 2015-02-17 DIAGNOSIS — Z96651 Presence of right artificial knee joint: Secondary | ICD-10-CM | POA: Diagnosis not present

## 2015-02-17 DIAGNOSIS — R072 Precordial pain: Secondary | ICD-10-CM | POA: Diagnosis not present

## 2015-02-17 DIAGNOSIS — Z813 Family history of other psychoactive substance abuse and dependence: Secondary | ICD-10-CM | POA: Insufficient documentation

## 2015-02-17 DIAGNOSIS — K219 Gastro-esophageal reflux disease without esophagitis: Secondary | ICD-10-CM | POA: Insufficient documentation

## 2015-02-17 DIAGNOSIS — Z880 Allergy status to penicillin: Secondary | ICD-10-CM | POA: Diagnosis not present

## 2015-02-17 DIAGNOSIS — M549 Dorsalgia, unspecified: Secondary | ICD-10-CM | POA: Insufficient documentation

## 2015-02-17 DIAGNOSIS — I25119 Atherosclerotic heart disease of native coronary artery with unspecified angina pectoris: Secondary | ICD-10-CM | POA: Diagnosis not present

## 2015-02-17 DIAGNOSIS — Z955 Presence of coronary angioplasty implant and graft: Secondary | ICD-10-CM | POA: Diagnosis not present

## 2015-02-17 DIAGNOSIS — E559 Vitamin D deficiency, unspecified: Secondary | ICD-10-CM | POA: Insufficient documentation

## 2015-02-17 DIAGNOSIS — I259 Chronic ischemic heart disease, unspecified: Secondary | ICD-10-CM | POA: Insufficient documentation

## 2015-02-17 DIAGNOSIS — E785 Hyperlipidemia, unspecified: Secondary | ICD-10-CM | POA: Insufficient documentation

## 2015-02-17 DIAGNOSIS — Z888 Allergy status to other drugs, medicaments and biological substances status: Secondary | ICD-10-CM | POA: Diagnosis not present

## 2015-02-17 DIAGNOSIS — Z7982 Long term (current) use of aspirin: Secondary | ICD-10-CM | POA: Diagnosis not present

## 2015-02-17 DIAGNOSIS — R0602 Shortness of breath: Secondary | ICD-10-CM | POA: Diagnosis not present

## 2015-02-17 DIAGNOSIS — Z79899 Other long term (current) drug therapy: Secondary | ICD-10-CM | POA: Diagnosis not present

## 2015-02-17 DIAGNOSIS — Z8739 Personal history of other diseases of the musculoskeletal system and connective tissue: Secondary | ICD-10-CM | POA: Diagnosis not present

## 2015-02-17 DIAGNOSIS — I10 Essential (primary) hypertension: Secondary | ICD-10-CM | POA: Diagnosis not present

## 2015-02-17 DIAGNOSIS — Z87442 Personal history of urinary calculi: Secondary | ICD-10-CM | POA: Diagnosis not present

## 2015-02-17 DIAGNOSIS — Z6830 Body mass index (BMI) 30.0-30.9, adult: Secondary | ICD-10-CM | POA: Diagnosis not present

## 2015-02-17 DIAGNOSIS — R112 Nausea with vomiting, unspecified: Secondary | ICD-10-CM | POA: Diagnosis not present

## 2015-02-17 DIAGNOSIS — E669 Obesity, unspecified: Secondary | ICD-10-CM | POA: Insufficient documentation

## 2015-02-17 DIAGNOSIS — F419 Anxiety disorder, unspecified: Secondary | ICD-10-CM | POA: Diagnosis not present

## 2015-02-17 DIAGNOSIS — R600 Localized edema: Secondary | ICD-10-CM | POA: Insufficient documentation

## 2015-02-17 DIAGNOSIS — M170 Bilateral primary osteoarthritis of knee: Secondary | ICD-10-CM | POA: Insufficient documentation

## 2015-02-17 DIAGNOSIS — Z86718 Personal history of other venous thrombosis and embolism: Secondary | ICD-10-CM | POA: Insufficient documentation

## 2015-02-17 DIAGNOSIS — Z9842 Cataract extraction status, left eye: Secondary | ICD-10-CM | POA: Diagnosis not present

## 2015-02-17 DIAGNOSIS — Z8249 Family history of ischemic heart disease and other diseases of the circulatory system: Secondary | ICD-10-CM | POA: Insufficient documentation

## 2015-02-17 DIAGNOSIS — F329 Major depressive disorder, single episode, unspecified: Secondary | ICD-10-CM | POA: Insufficient documentation

## 2015-02-17 LAB — COMPREHENSIVE METABOLIC PANEL
ALK PHOS: 137 U/L — AB (ref 38–126)
ALT: 54 U/L (ref 14–54)
ANION GAP: 7 (ref 5–15)
AST: 105 U/L — ABNORMAL HIGH (ref 15–41)
Albumin: 4 g/dL (ref 3.5–5.0)
BILIRUBIN TOTAL: 1.2 mg/dL (ref 0.3–1.2)
BUN: 13 mg/dL (ref 6–20)
CALCIUM: 9.3 mg/dL (ref 8.9–10.3)
CO2: 28 mmol/L (ref 22–32)
CREATININE: 0.8 mg/dL (ref 0.44–1.00)
Chloride: 103 mmol/L (ref 101–111)
Glucose, Bld: 127 mg/dL — ABNORMAL HIGH (ref 65–99)
Potassium: 3.9 mmol/L (ref 3.5–5.1)
SODIUM: 138 mmol/L (ref 135–145)
TOTAL PROTEIN: 8.1 g/dL (ref 6.5–8.1)

## 2015-02-17 LAB — URINALYSIS COMPLETE WITH MICROSCOPIC (ARMC ONLY)
BILIRUBIN URINE: NEGATIVE
GLUCOSE, UA: NEGATIVE mg/dL
NITRITE: NEGATIVE
Protein, ur: 30 mg/dL — AB
SPECIFIC GRAVITY, URINE: 1.014 (ref 1.005–1.030)
pH: 6 (ref 5.0–8.0)

## 2015-02-17 LAB — CBC
HCT: 41.4 % (ref 35.0–47.0)
HEMOGLOBIN: 13.6 g/dL (ref 12.0–16.0)
MCH: 28.4 pg (ref 26.0–34.0)
MCHC: 33 g/dL (ref 32.0–36.0)
MCV: 86 fL (ref 80.0–100.0)
Platelets: 191 10*3/uL (ref 150–440)
RBC: 4.81 MIL/uL (ref 3.80–5.20)
RDW: 14 % (ref 11.5–14.5)
WBC: 11 10*3/uL (ref 3.6–11.0)

## 2015-02-17 LAB — TROPONIN I: TROPONIN I: 0.04 ng/mL — AB (ref <0.031)

## 2015-02-17 MED ORDER — ONDANSETRON HCL 4 MG PO TABS: 4.0000 mg | ORAL_TABLET | Freq: Four times a day (QID) | ORAL | Status: DC | PRN

## 2015-02-17 MED ORDER — ACETAMINOPHEN 650 MG RE SUPP: 650.0000 mg | Freq: Four times a day (QID) | RECTAL | Status: DC | PRN

## 2015-02-17 MED ORDER — HEPARIN SODIUM (PORCINE) 5000 UNIT/ML IJ SOLN
5000.0000 [IU] | Freq: Three times a day (TID) | INTRAMUSCULAR | Status: DC
  Administered 2015-02-18 – 2015-02-19 (×5): 5000 [IU] via SUBCUTANEOUS
  Filled 2015-02-17 (×5): qty 1

## 2015-02-17 MED ORDER — ONDANSETRON HCL 4 MG/2ML IJ SOLN: 4.0000 mg | Freq: Four times a day (QID) | INTRAMUSCULAR | Status: DC | PRN

## 2015-02-17 MED ORDER — ONDANSETRON HCL 4 MG/2ML IJ SOLN
4.0000 mg | Freq: Once | INTRAMUSCULAR | Status: AC
  Administered 2015-02-17: 4 mg via INTRAVENOUS
  Filled 2015-02-17: qty 2

## 2015-02-17 MED ORDER — NITROGLYCERIN 0.4 MG SL SUBL
0.4000 mg | SUBLINGUAL_TABLET | SUBLINGUAL | Status: DC | PRN
  Administered 2015-02-17: 0.4 mg via SUBLINGUAL
  Filled 2015-02-17: qty 1

## 2015-02-17 MED ORDER — MORPHINE SULFATE (PF) 2 MG/ML IV SOLN
2.0000 mg | INTRAVENOUS | Status: DC | PRN
  Administered 2015-02-18: 2 mg via INTRAVENOUS
  Filled 2015-02-17: qty 1

## 2015-02-17 MED ORDER — SODIUM CHLORIDE 0.9 % IJ SOLN
3.0000 mL | Freq: Two times a day (BID) | INTRAMUSCULAR | Status: DC
  Administered 2015-02-18 (×2): 3 mL via INTRAVENOUS

## 2015-02-17 MED ORDER — OXYCODONE HCL 5 MG PO TABS
5.0000 mg | ORAL_TABLET | ORAL | Status: DC | PRN
  Administered 2015-02-18 (×2): 5 mg via ORAL
  Filled 2015-02-17 (×2): qty 1

## 2015-02-17 MED ORDER — ACETAMINOPHEN 325 MG PO TABS: 650.0000 mg | ORAL_TABLET | Freq: Four times a day (QID) | ORAL | Status: DC | PRN

## 2015-02-17 MED ORDER — ASPIRIN EC 325 MG PO TBEC
325.0000 mg | DELAYED_RELEASE_TABLET | Freq: Once | ORAL | Status: AC
  Administered 2015-02-17: 325 mg via ORAL
  Filled 2015-02-17: qty 1

## 2015-02-17 MED ORDER — HYDRALAZINE HCL 20 MG/ML IJ SOLN
10.0000 mg | INTRAMUSCULAR | Status: DC | PRN
Start: 2015-02-17 — End: 2015-02-19

## 2015-02-17 NOTE — ED Notes (Signed)
Admitting Dr. Lavetta Nielsen at bedside
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
## 2015-02-17 NOTE — ED Notes (Signed)
Pt to triage via wheelchair.  Pt reports abd pain that moved into her chest today.  Pt has nausea.  No sob.  Pt alert.  Skin warm and dry.

## 2015-02-18 ENCOUNTER — Observation Stay
Admit: 2015-02-18 | Discharge: 2015-02-18 | Disposition: A | Discharge status: Home / Self Care | Payer: Medicare Other | Attending: Internal Medicine | Admitting: Internal Medicine

## 2015-02-18 DIAGNOSIS — I10 Essential (primary) hypertension: Secondary | ICD-10-CM | POA: Diagnosis not present

## 2015-02-18 DIAGNOSIS — I200 Unstable angina: Secondary | ICD-10-CM | POA: Diagnosis not present

## 2015-02-18 DIAGNOSIS — E785 Hyperlipidemia, unspecified: Secondary | ICD-10-CM | POA: Diagnosis not present

## 2015-02-18 DIAGNOSIS — I2510 Atherosclerotic heart disease of native coronary artery without angina pectoris: Secondary | ICD-10-CM | POA: Diagnosis not present

## 2015-02-18 DIAGNOSIS — R079 Chest pain, unspecified: Secondary | ICD-10-CM | POA: Diagnosis not present

## 2015-02-18 DIAGNOSIS — I208 Other forms of angina pectoris: Secondary | ICD-10-CM | POA: Diagnosis not present

## 2015-02-18 LAB — TROPONIN I
TROPONIN I: 0.13 ng/mL — AB (ref <0.031)
TROPONIN I: 0.12 ng/mL — AB (ref <0.031)
TROPONIN I: 0.14 ng/mL — AB (ref <0.031)

## 2015-02-18 MED ORDER — SODIUM CHLORIDE 0.9 % IV SOLN
250.0000 mL | INTRAVENOUS | Status: DC | PRN
  Administered 2015-02-19: 5 mL via INTRAVENOUS

## 2015-02-18 MED ORDER — ASPIRIN 81 MG PO CHEW
81.0000 mg | CHEWABLE_TABLET | ORAL | Status: AC
  Administered 2015-02-19: 81 mg via ORAL
  Filled 2015-02-18: qty 1

## 2015-02-18 MED ORDER — ALPRAZOLAM 0.5 MG PO TABS: 0.5000 mg | ORAL_TABLET | Freq: Three times a day (TID) | ORAL | Status: DC | PRN

## 2015-02-18 MED ORDER — SODIUM CHLORIDE 0.9 % IJ SOLN: 3.0000 mL | INTRAMUSCULAR | Status: DC | PRN

## 2015-02-18 MED ORDER — POTASSIUM CHLORIDE CRYS ER 20 MEQ PO TBCR: 40.0000 meq | EXTENDED_RELEASE_TABLET | ORAL | Status: DC

## 2015-02-18 MED ORDER — DILTIAZEM HCL ER COATED BEADS 240 MG PO CP24
240.0000 mg | ORAL_CAPSULE | Freq: Every day | ORAL | Status: DC
  Administered 2015-02-18: 240 mg via ORAL
  Filled 2015-02-18: qty 1

## 2015-02-18 MED ORDER — VITAMIN B-12 1000 MCG PO TABS
1000.0000 ug | ORAL_TABLET | Freq: Every day | ORAL | Status: DC
  Administered 2015-02-18: 1000 ug via ORAL
  Filled 2015-02-18: qty 1

## 2015-02-18 MED ORDER — SERTRALINE HCL 25 MG PO TABS
100.0000 mg | ORAL_TABLET | Freq: Every day | ORAL | Status: DC
  Administered 2015-02-18: 100 mg via ORAL
  Filled 2015-02-18: qty 4

## 2015-02-18 MED ORDER — SODIUM CHLORIDE 0.9 % IJ SOLN
3.0000 mL | Freq: Two times a day (BID) | INTRAMUSCULAR | Status: DC
  Administered 2015-02-18: 3 mL via INTRAVENOUS

## 2015-02-18 MED ORDER — ASPIRIN 81 MG PO CHEW
81.0000 mg | CHEWABLE_TABLET | Freq: Every day | ORAL | Status: DC
  Administered 2015-02-18: 81 mg via ORAL
  Filled 2015-02-18: qty 1

## 2015-02-18 MED ORDER — NITROGLYCERIN 0.4 MG SL SUBL: 0.4000 mg | SUBLINGUAL_TABLET | SUBLINGUAL | Status: DC | PRN

## 2015-02-18 MED ORDER — SODIUM CHLORIDE 0.9 % WEIGHT BASED INFUSION
3.0000 mL/kg/h | INTRAVENOUS | Status: AC
  Administered 2015-02-19: 3 mL/kg/h via INTRAVENOUS

## 2015-02-18 MED ORDER — VITAMIN D 1000 UNITS PO TABS
1000.0000 [IU] | ORAL_TABLET | Freq: Two times a day (BID) | ORAL | Status: DC
  Administered 2015-02-18 (×3): 1000 [IU] via ORAL
  Filled 2015-02-18 (×3): qty 1

## 2015-02-18 MED ORDER — SODIUM CHLORIDE 0.9 % WEIGHT BASED INFUSION
1.0000 mL/kg/h | INTRAVENOUS | Status: DC
  Administered 2015-02-19: 1 mL/kg/h via INTRAVENOUS

## 2015-02-18 MED ORDER — ATORVASTATIN CALCIUM 20 MG PO TABS
80.0000 mg | ORAL_TABLET | Freq: Every evening | ORAL | Status: DC
  Administered 2015-02-18: 80 mg via ORAL
  Filled 2015-02-18: qty 4

## 2015-02-18 MED ORDER — POTASSIUM CHLORIDE 10 MEQ/100ML IV SOLN
10.0000 meq | INTRAVENOUS | Status: DC
  Filled 2015-02-18 (×4): qty 100
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
## 2015-02-18 NOTE — Care Management (Signed)
Patient presents with chest pain and admitted under observation status.  She has grandchildren who live with her and she is independent in all her adls.  Current with her pcp, no transportation issues or problems accessing medical care.  No discharge needs identified
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
## 2015-02-18 NOTE — Care Management Obs Status (Signed)
[place] NOTIFICATION   Patient Details  Name: [name] MRN: [id number] Date of Birth: Aug 28, 1935   Medicare Observation Status Notification Given:       Katrina Stack, RN 02/18/2015, 11:33 AM

## 2015-02-18 NOTE — Progress Notes (Signed)
[name] at [place] NAME: [name]    MR#:  [id number]  DATE OF BIRTH:  1935/07/31  SUBJECTIVE:  CHIEF COMPLAINT:   Chief Complaint  Patient presents with  . Chest Pain   Minimal chest pain on my examination. No shortness of breath.  REVIEW OF SYSTEMS:   Review of Systems  Constitutional: Negative for fever.  Respiratory: Negative for shortness of breath.   Cardiovascular: Positive for chest pain. Negative for palpitations.  Gastrointestinal: Positive for nausea and vomiting. Negative for abdominal pain.  Genitourinary: Negative for dysuria.    DRUG ALLERGIES:     VITALS:  Blood pressure 137/75, pulse 63, temperature 98.3 F (36.8 C), temperature source Oral, resp. rate 17, height 5\' 5"  (1.651 m), weight 95.89 kg (211 lb 6.4 oz), SpO2 94 %.  PHYSICAL EXAMINATION:  GENERAL:  79 y.o.-year-old patient lying in the bed with no acute distress. Obese LUNGS: Normal breath sounds bilaterally, no wheezing, rales,rhonchi or crepitation. No use of accessory muscles of respiration.  CARDIOVASCULAR: S1, S2 normal. 3/6 systolic ejection murmurs, no rubs, or gallops.  ABDOMEN: Soft, nontender, nondistended. Bowel sounds present. No organomegaly or mass.  EXTREMITIES: No pedal edema, cyanosis, or clubbing.  NEUROLOGIC: Cranial nerves II through XII are intact. Muscle strength 5/5 in all extremities. Sensation intact. Gait not checked.  PSYCHIATRIC: The patient is alert and oriented x 3.  SKIN: No obvious rash, lesion, or ulcer.    LABORATORY PANEL:   CBC  Recent Labs Lab 02/17/15 2021  WBC 11.0  HGB 13.6  HCT 41.4  PLT 191   ------------------------------------------------------------------------------------------------------------------  Chemistries   Recent Labs Lab 02/17/15 2021  NA 138  K 3.9  CL 103  CO2 28  GLUCOSE 127*  BUN 13  CREATININE 0.80  CALCIUM 9.3  AST 105*  ALT 54  ALKPHOS 137*  BILITOT  1.2   ------------------------------------------------------------------------------------------------------------------  Cardiac Enzymes  Recent Labs Lab 02/18/15 1338  TROPONINI 0.14*   ------------------------------------------------------------------------------------------------------------------  RADIOLOGY:  Dg Chest 2 View  02/17/2015  CLINICAL DATA:  Acute onset of generalized chest pain, nausea and body aches. Initial encounter. EXAM: CHEST  2 VIEW COMPARISON:  Chest radiograph performed 02/04/2014 FINDINGS: The lungs are well-aerated. Mild bibasilar atelectasis is noted. There is no evidence of pleural effusion or pneumothorax. The heart is borderline normal in size. No acute osseous abnormalities are seen. IMPRESSION: Mild bibasilar atelectasis noted.  Lungs otherwise clear. Electronically Signed   By: Garald Balding M.D.   On: 02/17/2015 22:49    EKG:   Orders placed or performed during the hospital encounter of 02/17/15  . ED EKG  . ED EKG  . EKG 12-Lead  . EKG 12-Lead  . EKG 12-Lead  . EKG 12-Lead    ASSESSMENT AND PLAN:   1. Unstable angina: - Cardiology following with plan for catheterization in the morning - Troponins elevated but stable - Continue aspirin, statin, nitroglycerin, - Echo pending  2. Hypertension - Continue diltiazem  3. Hyperlipidemia - Continue statin    All the records are reviewed and case discussed with Care Management/Social Workerr. Management plans discussed with the patient, family and they are in agreement.  CODE STATUS: Full  TOTAL TIME TAKING CARE OF THIS PATIENT: 25 minutes.  Greater than 50% of time spent in care coordination and counseling. POSSIBLE D/C IN 1-2 DAYS, DEPENDING ON CLINICAL CONDITION.   Myrtis Ser M.D on 02/18/2015 at 5:22 PM  Between 7am to 6pm - Pager -  [phone]  After 6pm go to [email or web address] - password [email or web address] [facility]  Office  [phone]  CC: Primary care  physician; [name], MD

## 2015-02-19 ENCOUNTER — Encounter
Admission: EM | Disposition: A | Discharge status: Home / Self Care | Payer: Self-pay | Source: Home / Self Care | Attending: Emergency Medicine

## 2015-02-19 ENCOUNTER — Encounter: Payer: Self-pay | Admitting: Internal Medicine

## 2015-02-19 DIAGNOSIS — I2510 Atherosclerotic heart disease of native coronary artery without angina pectoris: Secondary | ICD-10-CM | POA: Diagnosis not present

## 2015-02-19 DIAGNOSIS — E785 Hyperlipidemia, unspecified: Secondary | ICD-10-CM | POA: Diagnosis not present

## 2015-02-19 DIAGNOSIS — I10 Essential (primary) hypertension: Secondary | ICD-10-CM | POA: Diagnosis not present

## 2015-02-19 DIAGNOSIS — R072 Precordial pain: Secondary | ICD-10-CM | POA: Diagnosis not present

## 2015-02-19 DIAGNOSIS — I208 Other forms of angina pectoris: Secondary | ICD-10-CM | POA: Diagnosis not present

## 2015-02-19 HISTORY — PX: CARDIAC CATHETERIZATION: SHX172

## 2015-02-19 LAB — BASIC METABOLIC PANEL
ANION GAP: 7 (ref 5–15)
BUN: 13 mg/dL (ref 6–20)
CALCIUM: 8.7 mg/dL — AB (ref 8.9–10.3)
CO2: 27 mmol/L (ref 22–32)
CREATININE: 1.01 mg/dL — AB (ref 0.44–1.00)
Chloride: 104 mmol/L (ref 101–111)
GFR, EST NON AFRICAN AMERICAN: 52 mL/min — AB (ref >60)
Glucose, Bld: 105 mg/dL — ABNORMAL HIGH (ref 65–99)
Potassium: 3.5 mmol/L (ref 3.5–5.1)
SODIUM: 138 mmol/L (ref 135–145)

## 2015-02-19 LAB — CBC
HEMATOCRIT: 39.8 % (ref 35.0–47.0)
Hemoglobin: 13.2 g/dL (ref 12.0–16.0)
MCH: 28.6 pg (ref 26.0–34.0)
MCHC: 33.2 g/dL (ref 32.0–36.0)
MCV: 86.2 fL (ref 80.0–100.0)
PLATELETS: 175 10*3/uL (ref 150–440)
RBC: 4.62 MIL/uL (ref 3.80–5.20)
RDW: 14.2 % (ref 11.5–14.5)
WBC: 5.7 10*3/uL (ref 3.6–11.0)

## 2015-02-19 SURGERY — LEFT HEART CATH AND CORONARY ANGIOGRAPHY
Anesthesia: Moderate Sedation

## 2015-02-19 MED ORDER — FENTANYL CITRATE (PF) 100 MCG/2ML IJ SOLN
INTRAMUSCULAR | Status: AC
  Filled 2015-02-19: qty 2

## 2015-02-19 MED ORDER — SODIUM CHLORIDE 0.9 % IJ SOLN: 3.0000 mL | INTRAMUSCULAR | Status: DC | PRN

## 2015-02-19 MED ORDER — IOHEXOL 300 MG/ML  SOLN
INTRAMUSCULAR | Status: DC | PRN
  Administered 2015-02-19: 120 mL via INTRAVENOUS

## 2015-02-19 MED ORDER — ISOSORBIDE MONONITRATE ER 30 MG PO TB24: 30.0000 mg | ORAL_TABLET | Freq: Every day | ORAL | Status: DC

## 2015-02-19 MED ORDER — HEPARIN (PORCINE) IN NACL 2-0.9 UNIT/ML-% IJ SOLN
INTRAMUSCULAR | Status: AC
  Filled 2015-02-19: qty 1000

## 2015-02-19 MED ORDER — FENTANYL CITRATE (PF) 100 MCG/2ML IJ SOLN
INTRAMUSCULAR | Status: DC | PRN
  Administered 2015-02-19 (×3): 25 ug via INTRAVENOUS

## 2015-02-19 MED ORDER — ONDANSETRON HCL 4 MG/2ML IJ SOLN: 4.0000 mg | Freq: Four times a day (QID) | INTRAMUSCULAR | Status: DC | PRN

## 2015-02-19 MED ORDER — SODIUM CHLORIDE 0.9 % IJ SOLN: 3.0000 mL | Freq: Two times a day (BID) | INTRAMUSCULAR | Status: DC

## 2015-02-19 MED ORDER — SODIUM CHLORIDE 0.9 % WEIGHT BASED INFUSION: 3.0000 mL/kg/h | INTRAVENOUS | Status: DC

## 2015-02-19 MED ORDER — OXYCODONE-ACETAMINOPHEN 5-325 MG PO TABS: 1.0000 | ORAL_TABLET | ORAL | Status: DC | PRN

## 2015-02-19 MED ORDER — SODIUM CHLORIDE 0.9 % IV SOLN: 250.0000 mL | INTRAVENOUS | Status: DC | PRN

## 2015-02-19 MED ORDER — MIDAZOLAM HCL 2 MG/2ML IJ SOLN
INTRAMUSCULAR | Status: AC
  Filled 2015-02-19: qty 2

## 2015-02-19 MED ORDER — LIDOCAINE HCL (PF) 1 % IJ SOLN
INTRAMUSCULAR | Status: DC | PRN
  Administered 2015-02-19: 13:00:00

## 2015-02-19 MED ORDER — MIDAZOLAM HCL 2 MG/2ML IJ SOLN
INTRAMUSCULAR | Status: DC | PRN
  Administered 2015-02-19 (×2): 0.5 mg via INTRAVENOUS

## 2015-02-19 MED ORDER — ACETAMINOPHEN 325 MG PO TABS: 650.0000 mg | ORAL_TABLET | ORAL | Status: DC | PRN

## 2015-02-19 MED ORDER — ASPIRIN 81 MG PO CHEW: 81.0000 mg | CHEWABLE_TABLET | Freq: Every day | ORAL | Status: DC

## 2015-02-19 SURGICAL SUPPLY — 10 items
CATH INFINITI 5FR ANG PIGTAIL (CATHETERS) ×2 IMPLANT
CATH INFINITI 5FR JL4 (CATHETERS) ×2 IMPLANT
CATH INFINITI JR4 5F (CATHETERS) ×2 IMPLANT
DEVICE CLOSURE MYNXGRIP 5F (Vascular Products) ×1 IMPLANT
KIT MANI 3VAL PERCEP (MISCELLANEOUS) ×2 IMPLANT
NDL PERC 18GX7CM (NEEDLE) ×1 IMPLANT
NEEDLE PERC 18GX7CM (NEEDLE) ×2 IMPLANT
PACK CARDIAC CATH (CUSTOM PROCEDURE TRAY) ×2 IMPLANT
SHEATH AVANTI 5FR X 11CM (SHEATH) ×2 IMPLANT
WIRE EMERALD 3MM-J .035X150CM (WIRE) ×2 IMPLANT

## 2015-02-19 NOTE — Progress Notes (Signed)
Pt remains clinically stable post heart cath, Dr Clayborn Bigness has spoken to pt and family regarding procedure results, may be discharged later this pm, vitals stable, report given to care nurse on 2a per [facility] RN.no bleeding nor hematoma at groin site.

## 2015-02-19 NOTE — Discharge Summary (Signed)
[place] at [place] NAME: [name]    MR#:  [id number]  DATE OF BIRTH:  08/15/1935  DATE OF ADMISSION:  02/17/2015 ADMITTING PHYSICIAN: Lytle Butte, MD  DATE OF DISCHARGE: 02/19/2015  4:49 PM  PRIMARY CARE PHYSICIAN: [name], MD    ADMISSION DIAGNOSIS:  Shortness of breath [R06.02] Unstable angina (HCC) [I20.0] NSTEMI (non-ST elevated myocardial infarction) ([place]) [I21.4] Non-intractable vomiting with nausea, vomiting of unspecified type [R11.2]  DISCHARGE DIAGNOSIS:  Principal Problem:   Chest pain, central   SECONDARY DIAGNOSIS:   Past Medical History  Diagnosis Date  . Cholelithiasis 07/1996  . Hyperlipidemia 03/1999    takes Lipitor nightly  . NSVD (normal spontaneous vaginal delivery)     x 5  . Allergy 08/26/2002    [facility] hemoptysis actually allergic rhinitis  . CAD (coronary artery disease)     1 stent  . Peripheral edema     takes Furosemide daily  . Pneumonia     hx of--as a child  . Osteoarthritis 07/1996  . Joint pain   . Joint swelling   . Back pain     pt states from knee pain  . GERD (gastroesophageal reflux disease)     takes Prevacid daily  . Urinary frequency   . Depression     takes Zoloft daily  . Primary osteoarthritis of left knee 09/23/2010    Per Dr. Mardelle Matte with [facility]   . Osteoarthritis of left knee 08/02/2011  . Hypertension 06/1999  . Osteoarthritis of right knee 11/01/2011  . Obesity (BMI 30.0-34.9) 06/21/2006    Qualifier: Diagnosis of  By: Council Mechanic MD, Hilaria Ota   . Kidney stones   . Left leg DVT ([place]) 07/2010  . Exertional dyspnea 11/03/2011  . History of gout   . Infection of total right knee replacement ([place]) 12/23/2011  . Status post right total knee replacement 12/25/2011  . Group B streptococcal infection 12/25/2011    HOSPITAL COURSE:   79 year old female with past medical history of hypertension, osteoarthritis, gout, history of coronary  disease, hyperlipidemia who presented to the hospital due to chest pain and noted to have an elevated troponin.  #1 unstable angina-this was the working diagnosis on admission given patient's chest pain and elevated troponin. Patient's troponins actually did not trending upwards. -Patient was seen by cardiology and underwent cardiac catheterization which showed a 90% stenosis to the right coronary but she has left dominant vessels and this was likely not the culprit. As per cardiology didn't want a medically manage her and if she continues to have symptoms then consider intervention. - AT This point patient is being discharged on aspirin, Imdur, statin with follow-up with cardiology as an outpatient.  #2 hypertension-patient remained hemodynamically stable. She will continue her diltiazem.  #3 hyperlipidemia-patient will resume her atorvastatin.  #4 anxiety-patient will resume her Xanax.  DISCHARGE CONDITIONS:   Stable  CONSULTS OBTAINED:  Treatment Team:  Lytle Butte, MD Yolonda Kida, MD  DRUG ALLERGIES:   Allergies  Allergen Reactions  . Amoxicillin Swelling    "face, hands"  . Penicillins Swelling and Other (See Comments)    Has patient had a PCN reaction causing immediate rash, facial/tongue/throat swelling, SOB or lightheadedness with hypotension: Yes Has patient had a PCN reaction causing severe rash involving mucus membranes or skin necrosis: No Has patient had a PCN reaction that required hospitalization No Has patient had a PCN reaction occurring within the last  10 years: Yes If all of the above answers are "NO", then may proceed with Cephalosporin use.  . Valium [Diazepam] Other (See Comments)    Pt had "shakes".    DISCHARGE MEDICATIONS:   Discharge Medication List as of 02/19/2015  4:23 PM    START taking these medications   Details  isosorbide mononitrate (IMDUR) 30 MG 24 hr tablet Take 1 tablet (30 mg total) by mouth daily., Starting 02/19/2015, Until  Discontinued, Print      CONTINUE these medications which have NOT CHANGED   Details  ALPRAZolam (XANAX) 0.5 MG tablet Take 1-2 tablets (0.5-1 mg total) by mouth 3 (three) times daily as needed for anxiety., Starting 11/13/2014, Until Discontinued, Print    aspirin 81 MG tablet Take 81 mg by mouth daily.  , Until Discontinued, Historical Med    atorvastatin (LIPITOR) 80 MG tablet TAKE ONE TABLET BY MOUTH EVERY DAY, Normal    cholecalciferol (VITAMIN D) 1000 UNITS tablet Take 1,000 Units by mouth 2 (two) times daily.  , Until Discontinued, Historical Med    diltiazem (TAZTIA XT) 240 MG 24 hr capsule Take 240 mg by mouth daily., Until Discontinued, Historical Med    loperamide (IMODIUM A-D) 2 MG tablet Take 1 tablet (2 mg total) by mouth daily as needed for diarrhea or loose stools., Starting 01/28/2015, Until Discontinued, OTC    sertraline (ZOLOFT) 100 MG tablet TAKE ONE TABLET BY MOUTH EVERY DAY, Normal    vitamin B-12 (CYANOCOBALAMIN) 1000 MCG tablet Take 1,000 mcg by mouth daily., Until Discontinued, Historical Med    furosemide (LASIX) 20 MG tablet Take 1 tablet (20 mg total) by mouth daily as needed for fluid., Starting 11/13/2014, Until Discontinued, Normal         DISCHARGE INSTRUCTIONS:   DIET:  Cardiac diet  DISCHARGE CONDITION:  Stable  ACTIVITY:  Activity as tolerated  OXYGEN:  Home Oxygen: No.   Oxygen Delivery: room air  DISCHARGE LOCATION:  home   If you experience worsening of your admission symptoms, develop shortness of breath, life threatening emergency, suicidal or homicidal thoughts you must seek medical attention immediately by calling 911 or calling your MD immediately  if symptoms less severe.  You Must read complete instructions/literature along with all the possible adverse reactions/side effects for all the Medicines you take and that have been prescribed to you. Take any new Medicines after you have completely understood and accpet all the  possible adverse reactions/side effects.   Please note  You were cared for by a hospitalist during your hospital stay. If you have any questions about your discharge medications or the care you received while you were in the hospital after you are discharged, you can call the unit and asked to speak with the hospitalist on call if the hospitalist that took care of you is not available. Once you are discharged, your primary care physician will handle any further medical issues. Please note that NO REFILLS for any discharge medications will be authorized once you are discharged, as it is imperative that you return to your primary care physician (or establish a relationship with a primary care physician if you do not have one) for your aftercare needs so that they can reassess your need for medications and monitor your lab values.     Today   Seen status post cardiac catheterization. No chest pain, shortness of breath, nausea, vomiting.  VITAL SIGNS:  Blood pressure 134/71, pulse 59, temperature 97.9 F (36.6 C), temperature source Oral, resp.  rate 17, height 5\' 5"  (1.651 m), weight 96.072 kg (211 lb 12.8 oz), SpO2 94 %.  I/O:   Intake/Output Summary (Last 24 hours) at 02/19/15 1705 Last data filed at 02/19/15 1103  Gross per 24 hour  Intake   1800 ml  Output   1300 ml  Net    500 ml    PHYSICAL EXAMINATION:  GENERAL:  79 y.o.-year-old patient lying in the bed with no acute distress.  EYES: Pupils equal, round, reactive to light and accommodation. No scleral icterus. Extraocular muscles intact.  HEENT: Head atraumatic, normocephalic. Oropharynx and nasopharynx clear.  NECK:  Supple, no jugular venous distention. No thyroid enlargement, no tenderness.  LUNGS: Normal breath sounds bilaterally, no wheezing, rales,rhonchi. No use of accessory muscles of respiration.  CARDIOVASCULAR: S1, S2 normal. No murmurs, rubs, or gallops.  ABDOMEN: Soft, non-tender, non-distended. Bowel sounds  present. No organomegaly or mass.  EXTREMITIES: No pedal edema, cyanosis, or clubbing.  NEUROLOGIC: Cranial nerves II through XII are intact. No focal motor or sensory defecits b/l.  PSYCHIATRIC: The patient is alert and oriented x 3. Good affect.  SKIN: No obvious rash, lesion, or ulcer.   DATA REVIEW:   CBC  Recent Labs Lab 02/19/15 0500  WBC 5.7  HGB 13.2  HCT 39.8  PLT 175    Chemistries   Recent Labs Lab 02/17/15 2021 02/19/15 0500  NA 138 138  K 3.9 3.5  CL 103 104  CO2 28 27  GLUCOSE 127* 105*  BUN 13 13  CREATININE 0.80 1.01*  CALCIUM 9.3 8.7*  AST 105*  --   ALT 54  --   ALKPHOS 137*  --   BILITOT 1.2  --     Cardiac Enzymes  Recent Labs Lab 02/18/15 1338  TROPONINI 0.14*    RADIOLOGY:  Dg Chest 2 View  02/17/2015  CLINICAL DATA:  Acute onset of generalized chest pain, nausea and body aches. Initial encounter. EXAM: CHEST  2 VIEW COMPARISON:  Chest radiograph performed 02/04/2014 FINDINGS: The lungs are well-aerated. Mild bibasilar atelectasis is noted. There is no evidence of pleural effusion or pneumothorax. The heart is borderline normal in size. No acute osseous abnormalities are seen. IMPRESSION: Mild bibasilar atelectasis noted.  Lungs otherwise clear. Electronically Signed   By: Garald Balding M.D.   On: 02/17/2015 22:49      Management plans discussed with the patient, family and they are in agreement.  CODE STATUS:     Code Status Orders        Start     Ordered   02/19/15 1317  Full code   Continuous     02/19/15 1318      TOTAL TIME TAKING CARE OF THIS PATIENT: 40 minutes.    Henreitta Leber M.D on 02/19/2015 at 5:05 PM  Between 7am to 6pm - Pager - [phone]  After 6pm go to [email or web address] - password [email or web address] [facility]  Office  [phone]  CC: Primary care physician; [name], MD

## 2015-02-19 NOTE — Progress Notes (Signed)
Pt returned from Cath lab s/p heart cath.  Rt groin dressing dry and intact.  Bedrest restrictions up.  IVF infusing well.  Cardiac monitor applied to pt, pt denies chest pain.  CB in reach, SR up x 2.

## 2015-02-19 NOTE — Progress Notes (Signed)
Pt discharged home via wheelchair, family at side. IV discontinued without incident. Reviewed home medication with patient. Follow-up appointment information given. No questions verbalized.

## 2015-02-20 ENCOUNTER — Telehealth: Payer: Self-pay | Admitting: *Deleted

## 2015-02-20 NOTE — Telephone Encounter (Signed)
Transition Care Management Follow-up Telephone Call   Date discharged? 02/19/15   How have you been since you were released from the hospital? Weak but overall improved.   Do you understand why you were in the hospital? yes   Do you understand the discharge instructions? yes   Where were you discharged to? Home   Items Reviewed:  Medications reviewed: yes  Allergies reviewed: yes  Dietary changes reviewed: no  Referrals reviewed: n/a   Functional Questionnaire:   Activities of Daily Living (ADLs):   She states they are independent in the following: ambulation, bathing and hygiene, feeding, continence, grooming, toileting and dressing States they require assistance with the following: none   Any transportation issues/concerns?: no   Any patient concerns? no   Confirmed importance and date/time of follow-up visits scheduled yes, 02/23/15 @ 1515  Provider Appointment booked with Renford Dills, MD  Confirmed with patient if condition begins to worsen call PCP or go to the ER.  Patient was given the office number and encouraged to call back with question or concerns.  : yes

## 2015-02-20 NOTE — Telephone Encounter (Signed)
Pt returned your call- please call back. Says that you already have the best number  Thank you

## 2015-02-20 NOTE — Telephone Encounter (Signed)
Transitional care call attempted.  No answer, no voice mail to leave a message.  Will continue attempts to contact patient.  Follow scheduled 02/23/15.

## 2015-02-23 ENCOUNTER — Ambulatory Visit (INDEPENDENT_AMBULATORY_CARE_PROVIDER_SITE_OTHER): Payer: Medicare Other | Admitting: Family Medicine

## 2015-02-23 ENCOUNTER — Encounter: Payer: Self-pay | Admitting: Family Medicine

## 2015-02-23 VITALS — BP 116/60 | HR 83 | Temp 98.0°F | Wt 217.5 lb

## 2015-02-23 DIAGNOSIS — J309 Allergic rhinitis, unspecified: Secondary | ICD-10-CM | POA: Diagnosis not present

## 2015-02-23 DIAGNOSIS — I259 Chronic ischemic heart disease, unspecified: Secondary | ICD-10-CM

## 2015-02-23 MED ORDER — FLUTICASONE PROPIONATE 50 MCG/ACT NA SUSP: 2.0000 | Freq: Every day | NASAL | Status: DC

## 2015-02-23 NOTE — Patient Instructions (Signed)
Add on flonase nasal spray.  I sent that in.   You should have refills on the imdur.   I'll await the cardiology notes.  Take care.  Glad to see you.

## 2015-02-23 NOTE — Progress Notes (Signed)
Pre visit review using our clinic review tool, if applicable. No additional management support is needed unless otherwise documented below in the visit note. Discharge summary and inpatient course d/w pt: ADMISSION DIAGNOSIS:  Shortness of breath [R06.02] Unstable angina (HCC) [I20.0] NSTEMI (non-ST elevated myocardial infarction) ([place]) [I21.4] Non-intractable vomiting with nausea, vomiting of unspecified type [R11.2]  DISCHARGE DIAGNOSIS:  Principal Problem:  Chest pain, central   SECONDARY DIAGNOSIS:   Past Medical History  Diagnosis Date  . Cholelithiasis 07/1996  . Hyperlipidemia 03/1999    takes Lipitor nightly  . NSVD (normal spontaneous vaginal delivery)     x 5  . Allergy 08/26/2002    [facility] hemoptysis actually allergic rhinitis  . CAD (coronary artery disease)     1 stent  . Peripheral edema     takes Furosemide daily  . Pneumonia     hx of--as a child  . Osteoarthritis 07/1996  . Joint pain   . Joint swelling   . Back pain     pt states from knee pain  . GERD (gastroesophageal reflux disease)     takes Prevacid daily  . Urinary frequency   . Depression     takes Zoloft daily  . Primary osteoarthritis of left knee 09/23/2010    Per Dr. Mardelle Matte with [facility]   . Osteoarthritis of left knee 08/02/2011  . Hypertension 06/1999  . Osteoarthritis of right knee 11/01/2011  . Obesity (BMI 30.0-34.9) 06/21/2006    Qualifier: Diagnosis of By: Council Mechanic MD, Hilaria Ota   . Kidney stones   . Left leg DVT ([place]) 07/2010  . Exertional dyspnea 11/03/2011  . History of gout   . Infection of total right knee replacement ([place]) 12/23/2011  . Status post right total knee replacement 12/25/2011  . Group B streptococcal infection 12/25/2011    HOSPITAL COURSE:   79 year old female with past medical history of hypertension, osteoarthritis,  gout, history of coronary disease, hyperlipidemia who presented to the hospital due to chest pain and noted to have an elevated troponin.  #1 unstable angina-this was the working diagnosis on admission given patient's chest pain and elevated troponin. Patient's troponins actually did not trending upwards. -Patient was seen by cardiology and underwent cardiac catheterization which showed a 90% stenosis to the right coronary but she has left dominant vessels and this was likely not the culprit. As per cardiology didn't want a medically manage her and if she continues to have symptoms then consider intervention. - AT This point patient is being discharged on aspirin, Imdur, statin with follow-up with cardiology as an outpatient.  #2 hypertension-patient remained hemodynamically stable. She will continue her diltiazem.  #3 hyperlipidemia-patient will resume her atorvastatin.  #4 anxiety-patient will resume her Xanax.  DISCHARGE CONDITIONS:   Stable     ------------------------------------------------------------------------------------------------------------------------------------------------------------- To recap:  Was at home and had chest pain, was getting SOB.  To ER and admitted by cards with cath and echo done.  She had a RCA lesion noted but it was a small vessel and was elected to be treated medically.  No new stent placed.  LVgram okay.  Added on imdur as inpatient.    Troponin- max 0.14 as inpatient.  Cath d/w pt.    She has no CP now.  She was prev lightheaded once, the day of discharge, with new imdur use but that has resolved in the meantime.    She has had some post nasal gtt causing some UAN.  Prev used flonase, needed a refill.  Meds, vitals, and allergies reviewed.   ROS: See HPI.  Otherwise, noncontributory.  GEN: nad, alert and oriented HEENT: mucous membranes moist, nasal exam stuffy. OP wnl NECK: supple w/o LA CV: rrr.  PULM: ctab, no inc wob ABD: soft,  +bs EXT: no edema SKIN: no acute rash

## 2015-02-25 DIAGNOSIS — J310 Chronic rhinitis: Secondary | ICD-10-CM | POA: Insufficient documentation

## 2015-02-25 NOTE — Assessment & Plan Note (Signed)
Now with recent admission. She had a RCA lesion noted but it was a small vessel and was elected to be treated medically. No new stent placed. LVgram okay. Added on imdur as inpatient. Troponin- max 0.14 as inpatient. Cath d/w pt.  She has no CP now. Not SOB.  She was prev lightheaded once, the day of discharge, with new imdur use but that has resolved in the meantime.  Would continue as is.  Already on statin and appropriate BP meds.  Will await cards f/u.  Would continue imdur as is for now as it is likely providing some benefit w/o lightheadedness now.  Okay for outpatient f/u.

## 2015-02-25 NOTE — Assessment & Plan Note (Signed)
Prev used flonase, needed a refill.  Done at [place].  Fu prn.

## 2015-03-02 DIAGNOSIS — R0789 Other chest pain: Secondary | ICD-10-CM | POA: Diagnosis not present

## 2015-03-02 DIAGNOSIS — I259 Chronic ischemic heart disease, unspecified: Secondary | ICD-10-CM | POA: Diagnosis not present

## 2015-03-02 DIAGNOSIS — I10 Essential (primary) hypertension: Secondary | ICD-10-CM | POA: Diagnosis not present

## 2015-03-02 DIAGNOSIS — E782 Mixed hyperlipidemia: Secondary | ICD-10-CM | POA: Diagnosis not present

## 2015-03-17 ENCOUNTER — Telehealth: Payer: Self-pay | Admitting: Family Medicine

## 2015-03-17 NOTE — Telephone Encounter (Signed)
Patient says she received a letter from [facility] that they won't pay for the Alprazolam and that the MD needs to contact [facility].  Patient states she had the Rx filled and only paid $2.  I instructed the patient that if there is a problem with the refills at a later time, ask the pharmacy to send the appropriate information to [place].

## 2015-03-17 NOTE — Telephone Encounter (Signed)
Pt states she got a letter from [employer] company about the medication we prescribed to her.  She wants a call back to talk about the medication

## 2015-04-01 ENCOUNTER — Other Ambulatory Visit: Payer: Self-pay | Admitting: *Deleted

## 2015-04-01 NOTE — Telephone Encounter (Signed)
Received faxed refill request Last office visit 02/23/15 Last refill 06/23/14 #90/1 No recent lipid labs Is it okay to refill?

## 2015-04-02 MED ORDER — ATORVASTATIN CALCIUM 80 MG PO TABS: ORAL_TABLET | ORAL | Status: DC

## 2015-04-02 NOTE — Telephone Encounter (Signed)
Patient advised.  Lab appt scheduled. Appointment scheduled.

## 2015-04-02 NOTE — Telephone Encounter (Signed)
Sent.  Needs f/u with labs ahead of time. Orders are in.  Thanks.

## 2015-04-06 ENCOUNTER — Other Ambulatory Visit (INDEPENDENT_AMBULATORY_CARE_PROVIDER_SITE_OTHER): Payer: Medicare Other

## 2015-04-06 DIAGNOSIS — E785 Hyperlipidemia, unspecified: Secondary | ICD-10-CM

## 2015-04-06 DIAGNOSIS — M899 Disorder of bone, unspecified: Secondary | ICD-10-CM | POA: Diagnosis not present

## 2015-04-06 LAB — COMPREHENSIVE METABOLIC PANEL
ALK PHOS: 89 U/L (ref 39–117)
ALT: 10 U/L (ref 0–35)
AST: 18 U/L (ref 0–37)
Albumin: 3.6 g/dL (ref 3.5–5.2)
BILIRUBIN TOTAL: 0.4 mg/dL (ref 0.2–1.2)
BUN: 14 mg/dL (ref 6–23)
CO2: 28 meq/L (ref 19–32)
Calcium: 8.9 mg/dL (ref 8.4–10.5)
Chloride: 105 mEq/L (ref 96–112)
Creatinine, Ser: 0.84 mg/dL (ref 0.40–1.20)
GFR: 84.5 mL/min (ref >60.00)
GLUCOSE: 86 mg/dL (ref 70–99)
POTASSIUM: 4 meq/L (ref 3.5–5.1)
SODIUM: 140 meq/L (ref 135–145)
TOTAL PROTEIN: 7 g/dL (ref 6.0–8.3)

## 2015-04-06 LAB — LIPID PANEL
CHOL/HDL RATIO: 3
Cholesterol: 151 mg/dL (ref 0–200)
HDL: 47 mg/dL (ref >39.00)
LDL Cholesterol: 91 mg/dL (ref 0–99)
NONHDL: 104.4
Triglycerides: 69 mg/dL (ref 0.0–149.0)
VLDL: 13.8 mg/dL (ref 0.0–40.0)

## 2015-04-06 LAB — VITAMIN D 25 HYDROXY (VIT D DEFICIENCY, FRACTURES): VITD: 22.59 ng/mL — ABNORMAL LOW (ref 30.00–100.00)

## 2015-04-09 ENCOUNTER — Encounter: Payer: Self-pay | Admitting: Family Medicine

## 2015-04-09 ENCOUNTER — Ambulatory Visit (INDEPENDENT_AMBULATORY_CARE_PROVIDER_SITE_OTHER): Payer: Medicare Other | Admitting: Family Medicine

## 2015-04-09 VITALS — BP 116/58 | HR 61 | Temp 98.1°F | Wt 215.8 lb

## 2015-04-09 DIAGNOSIS — Z23 Encounter for immunization: Secondary | ICD-10-CM | POA: Diagnosis not present

## 2015-04-09 DIAGNOSIS — E785 Hyperlipidemia, unspecified: Secondary | ICD-10-CM

## 2015-04-09 DIAGNOSIS — M1990 Unspecified osteoarthritis, unspecified site: Secondary | ICD-10-CM | POA: Diagnosis not present

## 2015-04-09 DIAGNOSIS — M8580 Other specified disorders of bone density and structure, unspecified site: Secondary | ICD-10-CM

## 2015-04-09 MED ORDER — ZOSTER VACCINE LIVE 19400 UNT/0.65ML ~~LOC~~ SOLR: 0.6500 mL | Freq: Once | SUBCUTANEOUS | Status: DC

## 2015-04-09 NOTE — Patient Instructions (Addendum)
Check with your insurance to see if they will cover the shingles shot. Take vitamin D 4000 units a day.   Rosaria Ferries will call about your referral for the bone density test.  You can call for a mammogram at [facility].  [place] taking tylenol for the thumb pain.  I think that is from arthritis.   Take care.  Glad to see you.

## 2015-04-09 NOTE — Progress Notes (Signed)
Pre visit review using our clinic review tool, if applicable. No additional management support is needed unless otherwise documented below in the visit note.  L thumb OA.  Worse with weather change. No trauma.  Is tolerable as is.    Osteopenia.  Prev DXA done, due for repeat.  D/w pt.  Low Vit D noted, d/w pt.  See AVS.   Due for shingles shot, rx given to patient.   Flu shot today at [facility].   Elevated Cholesterol: Using medications without problems:yes Muscle aches: no Diet compliance:yes Exercise: as tolerated.   Meds, vitals, and allergies reviewed.   ROS: See HPI.  Otherwise, noncontributory.  GEN: nad, alert and oriented HEENT: mucous membranes moist NECK: supple w/o LA CV: rrr.  no murmur PULM: ctab, no inc wob ABD: soft, +bs EXT: no edema SKIN: no acute rash Chronic IP joint changes noted on the L thumb, w/o erythema.

## 2015-04-13 DIAGNOSIS — M1990 Unspecified osteoarthritis, unspecified site: Secondary | ICD-10-CM | POA: Insufficient documentation

## 2015-04-13 NOTE — Assessment & Plan Note (Signed)
Lipids are reasonable.  Continue as is.  >25 minutes spent in face to face time with patient, >50% spent in counselling or coordination of care. No change in statin.

## 2015-04-13 NOTE — Assessment & Plan Note (Signed)
Inc vit D replacement, see AVS and refer for DXA.  D/w pt.  She agrees.

## 2015-04-13 NOTE — Assessment & Plan Note (Signed)
Continue prn tylenol for thumb pain.

## 2015-04-17 ENCOUNTER — Telehealth: Payer: Self-pay | Admitting: Family Medicine

## 2015-04-17 DIAGNOSIS — Z1231 Encounter for screening mammogram for malignant neoplasm of breast: Secondary | ICD-10-CM

## 2015-04-17 NOTE — Telephone Encounter (Signed)
Pt wants to have mammogram and bone density same day i need screen mammogram order in system before [place] will let me schedule thanks

## 2015-04-19 NOTE — Telephone Encounter (Signed)
Ordered. Thanks

## 2015-04-20 NOTE — Telephone Encounter (Signed)
Appointment 2/16

## 2015-04-24 ENCOUNTER — Encounter: Payer: Self-pay | Admitting: Family Medicine

## 2015-04-30 ENCOUNTER — Ambulatory Visit
Admission: RE | Admit: 2015-04-30 | Discharge: 2015-04-30 | Disposition: A | Discharge status: Home / Self Care | Payer: Medicare Other | Source: Ambulatory Visit | Attending: Family Medicine | Admitting: Family Medicine

## 2015-04-30 ENCOUNTER — Encounter: Payer: Self-pay | Admitting: *Deleted

## 2015-04-30 ENCOUNTER — Encounter: Payer: Self-pay | Admitting: Family Medicine

## 2015-04-30 DIAGNOSIS — Z780 Asymptomatic menopausal state: Secondary | ICD-10-CM | POA: Diagnosis not present

## 2015-04-30 DIAGNOSIS — Z13820 Encounter for screening for osteoporosis: Secondary | ICD-10-CM | POA: Diagnosis not present

## 2015-04-30 DIAGNOSIS — M8580 Other specified disorders of bone density and structure, unspecified site: Secondary | ICD-10-CM

## 2015-04-30 DIAGNOSIS — Z1231 Encounter for screening mammogram for malignant neoplasm of breast: Secondary | ICD-10-CM | POA: Diagnosis not present

## 2015-04-30 DIAGNOSIS — M810 Age-related osteoporosis without current pathological fracture: Secondary | ICD-10-CM | POA: Insufficient documentation

## 2015-05-04 ENCOUNTER — Ambulatory Visit (INDEPENDENT_AMBULATORY_CARE_PROVIDER_SITE_OTHER): Payer: Medicare Other | Admitting: Family Medicine

## 2015-05-04 ENCOUNTER — Encounter: Payer: Self-pay | Admitting: Family Medicine

## 2015-05-04 VITALS — BP 132/64 | HR 64 | Temp 98.5°F | Wt 208.5 lb

## 2015-05-04 DIAGNOSIS — M810 Age-related osteoporosis without current pathological fracture: Secondary | ICD-10-CM | POA: Diagnosis not present

## 2015-05-04 DIAGNOSIS — F341 Dysthymic disorder: Secondary | ICD-10-CM

## 2015-05-04 MED ORDER — SERTRALINE HCL 100 MG PO TABS: 150.0000 mg | ORAL_TABLET | Freq: Every day | ORAL | Status: DC

## 2015-05-04 MED ORDER — ALPRAZOLAM 0.5 MG PO TABS: 0.5000 mg | ORAL_TABLET | Freq: Three times a day (TID) | ORAL | Status: DC | PRN

## 2015-05-04 NOTE — Assessment & Plan Note (Addendum)
Worsened by grief.  D/w pt.  Continue prn BZD, but inc sertraline to 150mg  a day and we'll check back on patient next week.  She can update me sooner if needed.  D/w pt about her grandson's situation, with grim prognosis.  We talked about her possibly visiting the hospital, etc.  She has family support.  Still okay for outpatient f/u. >25 minutes spent in face to face time with patient, >50% spent in counselling or coordination of care.

## 2015-05-04 NOTE — Assessment & Plan Note (Signed)
We can address this later on.  She agrees.

## 2015-05-04 NOTE — Patient Instructions (Signed)
Increase the sertraline to 1.5 tabs a day.  Use the xanax as needed.   Take care.  I'm always glad to see you.  I will continue to be thinking about you.  We'll work on the osteoporosis a little later on.
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
## 2015-05-12 ENCOUNTER — Telehealth: Payer: Self-pay | Admitting: Family Medicine
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
## 2015-05-12 NOTE — Telephone Encounter (Signed)
Noted. Thanks.

## 2015-05-12 NOTE — Telephone Encounter (Signed)
Patient states she is feeling better.

## 2015-07-16 ENCOUNTER — Encounter: Payer: Self-pay | Admitting: Family Medicine

## 2015-07-16 ENCOUNTER — Ambulatory Visit (INDEPENDENT_AMBULATORY_CARE_PROVIDER_SITE_OTHER): Payer: Medicare Other | Admitting: Family Medicine

## 2015-07-16 VITALS — BP 150/80 | HR 64 | Temp 98.2°F | Wt 208.0 lb

## 2015-07-16 DIAGNOSIS — R0789 Other chest pain: Secondary | ICD-10-CM

## 2015-07-16 MED ORDER — HYDROCODONE-ACETAMINOPHEN 5-325 MG PO TABS: 1.0000 | ORAL_TABLET | Freq: Four times a day (QID) | ORAL | Status: DC | PRN

## 2015-07-16 NOTE — Patient Instructions (Signed)
Use the pain medicine as needed and update me if not better soon.  Take care.  Glad to see you.
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
## 2015-07-17 DIAGNOSIS — R0789 Other chest pain: Secondary | ICD-10-CM | POA: Insufficient documentation

## 2015-07-17 NOTE — Assessment & Plan Note (Signed)
Likely not intrathoracic cause. D/w pt.  Imaging wouldn't change the mgmt at this point.  She agrees.  vicodin with routine cautions and d/w pt about fall cautions in general.  She agrees.  Okay for outpatient f/u.  I offered condolences re: her grandson and she thanked me.

## 2015-09-07 ENCOUNTER — Other Ambulatory Visit: Payer: Self-pay | Admitting: *Deleted

## 2015-09-07 MED ORDER — ALPRAZOLAM 0.5 MG PO TABS: 0.5000 mg | ORAL_TABLET | Freq: Three times a day (TID) | ORAL | Status: DC | PRN

## 2015-09-07 NOTE — Telephone Encounter (Signed)
Please call in.  Thanks.   

## 2015-09-07 NOTE — Telephone Encounter (Signed)
Ok to refill? Last filled 05/04/15 #60 1 RF

## 2015-09-08 DIAGNOSIS — E782 Mixed hyperlipidemia: Secondary | ICD-10-CM | POA: Diagnosis not present

## 2015-09-08 DIAGNOSIS — I259 Chronic ischemic heart disease, unspecified: Secondary | ICD-10-CM | POA: Diagnosis not present

## 2015-09-08 DIAGNOSIS — I10 Essential (primary) hypertension: Secondary | ICD-10-CM | POA: Diagnosis not present

## 2015-09-08 DIAGNOSIS — R079 Chest pain, unspecified: Secondary | ICD-10-CM | POA: Diagnosis not present

## 2015-09-08 NOTE — Telephone Encounter (Signed)
Medication phoned to pharmacy.  

## 2015-09-09 ENCOUNTER — Ambulatory Visit (INDEPENDENT_AMBULATORY_CARE_PROVIDER_SITE_OTHER): Payer: Medicare Other | Admitting: Family Medicine

## 2015-09-09 ENCOUNTER — Encounter: Payer: Self-pay | Admitting: Family Medicine

## 2015-09-09 VITALS — BP 150/90 | HR 62 | Temp 98.7°F | Ht 65.0 in | Wt 212.5 lb

## 2015-09-09 DIAGNOSIS — F341 Dysthymic disorder: Secondary | ICD-10-CM | POA: Diagnosis not present

## 2015-09-09 DIAGNOSIS — R3915 Urgency of urination: Secondary | ICD-10-CM

## 2015-09-09 LAB — POC URINALSYSI DIPSTICK (AUTOMATED)
BILIRUBIN UA: NEGATIVE
GLUCOSE UA: NEGATIVE
Ketones, UA: NEGATIVE
Leukocytes, UA: NEGATIVE
Nitrite, UA: NEGATIVE
Protein, UA: NEGATIVE
RBC UA: NEGATIVE
Urobilinogen, UA: 0.2
pH, UA: 5.5

## 2015-09-09 MED ORDER — SERTRALINE HCL 100 MG PO TABS: 150.0000 mg | ORAL_TABLET | Freq: Every day | ORAL | Status: DC

## 2015-09-09 NOTE — Patient Instructions (Signed)
I would go up on the sertaline, up to 1.5 tab a day.   I sent a new prescription to the pharmacy in the meantime.  Let me know if that isn't helping.  Take care.  Glad to see you.
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
## 2015-09-10 NOTE — Assessment & Plan Note (Signed)
D/w pt.  Sig stressors at home.  She does have some family members she can trust, is still safe at home per her report.  She is considering options re: her bank account.   Would still in sertraline to 150mg  a day for now and she'll update me.  I think she did have some relief from the med prev, but may require a higher dose.  At this point, still okay for outpatient f/u.  She agrees.

## 2015-09-13 ENCOUNTER — Encounter: Payer: Self-pay | Admitting: Emergency Medicine

## 2015-09-13 ENCOUNTER — Emergency Department
Admission: EM | Admit: 2015-09-13 | Discharge: 2015-09-13 | Disposition: A | Discharge status: Home / Self Care | Payer: Medicare Other | Attending: Emergency Medicine | Admitting: Emergency Medicine

## 2015-09-13 ENCOUNTER — Emergency Department: Payer: Medicare Other

## 2015-09-13 DIAGNOSIS — R197 Diarrhea, unspecified: Secondary | ICD-10-CM | POA: Diagnosis not present

## 2015-09-13 DIAGNOSIS — I2510 Atherosclerotic heart disease of native coronary artery without angina pectoris: Secondary | ICD-10-CM | POA: Diagnosis not present

## 2015-09-13 DIAGNOSIS — I4891 Unspecified atrial fibrillation: Secondary | ICD-10-CM

## 2015-09-13 DIAGNOSIS — Z7982 Long term (current) use of aspirin: Secondary | ICD-10-CM | POA: Diagnosis not present

## 2015-09-13 DIAGNOSIS — Z7951 Long term (current) use of inhaled steroids: Secondary | ICD-10-CM | POA: Insufficient documentation

## 2015-09-13 DIAGNOSIS — F329 Major depressive disorder, single episode, unspecified: Secondary | ICD-10-CM | POA: Diagnosis not present

## 2015-09-13 DIAGNOSIS — I10 Essential (primary) hypertension: Secondary | ICD-10-CM | POA: Insufficient documentation

## 2015-09-13 DIAGNOSIS — Z86718 Personal history of other venous thrombosis and embolism: Secondary | ICD-10-CM | POA: Diagnosis not present

## 2015-09-13 DIAGNOSIS — M810 Age-related osteoporosis without current pathological fracture: Secondary | ICD-10-CM | POA: Diagnosis not present

## 2015-09-13 DIAGNOSIS — E785 Hyperlipidemia, unspecified: Secondary | ICD-10-CM | POA: Diagnosis not present

## 2015-09-13 DIAGNOSIS — R112 Nausea with vomiting, unspecified: Secondary | ICD-10-CM

## 2015-09-13 DIAGNOSIS — R079 Chest pain, unspecified: Secondary | ICD-10-CM | POA: Diagnosis not present

## 2015-09-13 DIAGNOSIS — Z79899 Other long term (current) drug therapy: Secondary | ICD-10-CM | POA: Insufficient documentation

## 2015-09-13 DIAGNOSIS — J45909 Unspecified asthma, uncomplicated: Secondary | ICD-10-CM | POA: Diagnosis not present

## 2015-09-13 DIAGNOSIS — K2970 Gastritis, unspecified, without bleeding: Secondary | ICD-10-CM | POA: Diagnosis not present

## 2015-09-13 LAB — CBC
HEMATOCRIT: 40 % (ref 35.0–47.0)
HEMOGLOBIN: 13.5 g/dL (ref 12.0–16.0)
MCH: 28.9 pg (ref 26.0–34.0)
MCHC: 33.7 g/dL (ref 32.0–36.0)
MCV: 85.8 fL (ref 80.0–100.0)
PLATELETS: 221 10*3/uL (ref 150–440)
RBC: 4.66 MIL/uL (ref 3.80–5.20)
RDW: 15.5 % — AB (ref 11.5–14.5)
WBC: 9.4 10*3/uL (ref 3.6–11.0)

## 2015-09-13 LAB — BASIC METABOLIC PANEL
Anion gap: 15 (ref 5–15)
BUN: 19 mg/dL (ref 6–20)
CO2: 19 mmol/L — ABNORMAL LOW (ref 22–32)
Calcium: 9.2 mg/dL (ref 8.9–10.3)
Chloride: 101 mmol/L (ref 101–111)
Creatinine, Ser: 1.11 mg/dL — ABNORMAL HIGH (ref 0.44–1.00)
GFR calc Af Amer: 54 mL/min — ABNORMAL LOW (ref >60)
GFR calc non Af Amer: 47 mL/min — ABNORMAL LOW (ref >60)
Glucose, Bld: 69 mg/dL (ref 65–99)
Potassium: 3.9 mmol/L (ref 3.5–5.1)
Sodium: 135 mmol/L (ref 135–145)

## 2015-09-13 LAB — TROPONIN I: Troponin I: <0.03 ng/mL (ref <0.03)

## 2015-09-13 MED ORDER — METOPROLOL TARTRATE 25 MG PO TABS: 12.5000 mg | ORAL_TABLET | Freq: Two times a day (BID) | ORAL | Status: DC

## 2015-09-13 MED ORDER — METOPROLOL TARTRATE 25 MG PO TABS
25.0000 mg | ORAL_TABLET | Freq: Once | ORAL | Status: AC
  Administered 2015-09-13: 25 mg via ORAL
  Filled 2015-09-13: qty 1

## 2015-09-13 MED ORDER — APIXABAN 5 MG PO TABS
5.0000 mg | ORAL_TABLET | Freq: Once | ORAL | Status: AC
  Administered 2015-09-13: 5 mg via ORAL
  Filled 2015-09-13: qty 1

## 2015-09-13 MED ORDER — APIXABAN 5 MG PO TABS: 5.0000 mg | ORAL_TABLET | Freq: Two times a day (BID) | ORAL | Status: DC

## 2015-09-13 MED ORDER — GI COCKTAIL ~~LOC~~
30.0000 mL | Freq: Once | ORAL | Status: AC
  Administered 2015-09-13: 30 mL via ORAL
  Filled 2015-09-13: qty 30
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
## 2015-09-13 NOTE — Discharge Instructions (Signed)
Atrial Fibrillation Atrial fibrillation is a type of heartbeat that is irregular or fast (rapid). If you have this condition, your heart keeps quivering in a weird (chaotic) way. This condition can make it so your heart cannot pump blood normally. Having this condition gives a person more risk for stroke, heart failure, and other heart problems. There are different types of atrial fibrillation. Talk with your doctor to learn about the type that you have. HOME CARE  Take over-the-counter and prescription medicines only as told by your doctor.  If your doctor prescribed a blood-thinning medicine, take it exactly as told. Taking too much of it can cause bleeding. If you do not take enough of it, you will not have the protection that you need against stroke and other problems.  Do not use any tobacco products. These include cigarettes, chewing tobacco, and e-cigarettes. If you need help quitting, ask your doctor.  If you have apnea (obstructive sleep apnea), manage it as told by your doctor.  Do not drink alcohol.  Do not drink beverages that have caffeine. These include coffee, soda, and tea.  Maintain a healthy weight. Do not use diet pills unless your doctor says they are safe for you. Diet pills may make heart problems worse.  Follow diet instructions as told by your doctor.  Exercise regularly as told by your doctor.  Keep all follow-up visits as told by your doctor. This is important. GET HELP IF:  You notice a change in the speed, rhythm, or strength of your heartbeat.  You are taking a blood-thinning medicine and you notice more bruising.  You get tired more easily when you move or exercise. GET HELP RIGHT AWAY IF:  You have pain in your chest or your belly (abdomen).  You have sweating or weakness.  You feel sick to your stomach (nauseous).  You notice blood in your throw up (vomit), poop (stool), or pee (urine).  You are short of breath.  You suddenly have swollen feet  and ankles.  You feel dizzy.  Your suddenly get weak or numb in your face, arms, or legs, especially if it happens on one side of your body.  You have trouble talking, trouble understanding, or both.  Your face or your eyelid droops on one side. These symptoms may be an emergency. Do not wait to see if the symptoms will go away. Get medical help right away. Call your local emergency services (911 in the [place] Do not drive yourself to the hospital.   This information is not intended to replace advice given to you by your health care provider. Make sure you discuss any questions you have with your health care provider.   Document Released: 12/08/2007 Document Revised: 11/19/2014 Document Reviewed: 06/25/2014 Elsevier Interactive Patient Education 2016 Elsevier Inc.  Diarrhea Diarrhea is frequent loose and watery bowel movements. It can cause you to feel weak and dehydrated. Dehydration can cause you to become tired and thirsty, have a dry mouth, and have decreased urination that often is dark yellow. Diarrhea is a sign of another problem, most often an infection that will not last long. In most cases, diarrhea typically lasts 2-3 days. However, it can last longer if it is a sign of something more serious. It is important to treat your diarrhea as directed by your caregiver to lessen or prevent future episodes of diarrhea. CAUSES  Some common causes include:  Gastrointestinal infections caused by viruses, bacteria, or parasites.  Food poisoning or food allergies.  Certain medicines,  such as antibiotics, chemotherapy, and laxatives.  Artificial sweeteners and fructose.  Digestive disorders. HOME CARE INSTRUCTIONS  Ensure adequate fluid intake (hydration): Have 1 cup (8 oz) of fluid for each diarrhea episode. Avoid fluids that contain simple sugars or sports drinks, fruit juices, whole milk products, and sodas. Your urine should be clear or pale yellow if you are drinking enough fluids.  Hydrate with an oral rehydration solution that you can purchase at pharmacies, retail stores, and online. You can prepare an oral rehydration solution at home by mixing the following ingredients together:   - tsp table salt.   tsp baking soda.   tsp salt substitute containing potassium chloride.  1  tablespoons sugar.  1 L (34 oz) of water.  Certain foods and beverages may increase the speed at which food moves through the gastrointestinal (GI) tract. These foods and beverages should be avoided and include:  Caffeinated and alcoholic beverages.  High-fiber foods, such as raw fruits and vegetables, nuts, seeds, and whole grain breads and cereals.  Foods and beverages sweetened with sugar alcohols, such as xylitol, sorbitol, and mannitol.  Some foods may be well tolerated and may help thicken stool including:  Starchy foods, such as rice, toast, pasta, low-sugar cereal, oatmeal, grits, baked potatoes, crackers, and bagels.  Bananas.  Applesauce.  Add probiotic-rich foods to help increase healthy bacteria in the GI tract, such as yogurt and fermented milk products.  Wash your hands well after each diarrhea episode.  Only take over-the-counter or prescription medicines as directed by your caregiver.  Take a warm bath to relieve any burning or pain from frequent diarrhea episodes. SEEK IMMEDIATE MEDICAL CARE IF:   You are unable to keep fluids down.  You have persistent vomiting.  You have blood in your stool, or your stools are black and tarry.  You do not urinate in 6-8 hours, or there is only a small amount of very dark urine.  You have abdominal pain that increases or localizes.  You have weakness, dizziness, confusion, or light-headedness.  You have a severe headache.  Your diarrhea gets worse or does not get better.  You have a fever or persistent symptoms for more than 2-3 days.  You have a fever and your symptoms suddenly get worse. MAKE SURE YOU:    Understand these instructions.  Will watch your condition.  Will get help right away if you are not doing well or get worse.   This information is not intended to replace advice given to you by your health care provider. Make sure you discuss any questions you have with your health care provider.   Document Released: 02/18/2002 Document Revised: 03/21/2014 Document Reviewed: 11/06/2011 Elsevier Interactive Patient Education 2016 Elsevier Inc.  Nausea and Vomiting Nausea is a sick feeling that often comes before throwing up (vomiting). Vomiting is a reflex where stomach contents come out of your mouth. Vomiting can cause severe loss of body fluids (dehydration). Children and elderly adults can become dehydrated quickly, especially if they also have diarrhea. Nausea and vomiting are symptoms of a condition or disease. It is important to find the cause of your symptoms. CAUSES   Direct irritation of the stomach lining. This irritation can result from increased acid production (gastroesophageal reflux disease), infection, food poisoning, taking certain medicines (such as nonsteroidal anti-inflammatory drugs), alcohol use, or tobacco use.  Signals from the brain.These signals could be caused by a headache, heat exposure, an inner ear disturbance, increased pressure in the brain from injury, infection,  a tumor, or a concussion, pain, emotional stimulus, or metabolic problems.  An obstruction in the gastrointestinal tract (bowel obstruction).  Illnesses such as diabetes, hepatitis, gallbladder problems, appendicitis, kidney problems, cancer, sepsis, atypical symptoms of a heart attack, or eating disorders.  Medical treatments such as chemotherapy and radiation.  Receiving medicine that makes you sleep (general anesthetic) during surgery. DIAGNOSIS Your caregiver may ask for tests to be done if the problems do not improve after a few days. Tests may also be done if symptoms are severe or if  the reason for the nausea and vomiting is not clear. Tests may include:  Urine tests.  Blood tests.  Stool tests.  Cultures (to look for evidence of infection).  X-rays or other imaging studies. Test results can help your caregiver make decisions about treatment or the need for additional tests. TREATMENT You need to stay well hydrated. Drink frequently but in small amounts.You may wish to drink water, sports drinks, clear broth, or eat frozen ice pops or gelatin dessert to help stay hydrated.When you eat, eating slowly may help prevent nausea.There are also some antinausea medicines that may help prevent nausea. HOME CARE INSTRUCTIONS   Take all medicine as directed by your caregiver.  If you do not have an appetite, do not force yourself to eat. However, you must continue to drink fluids.  If you have an appetite, eat a normal diet unless your caregiver tells you differently.  Eat a variety of complex carbohydrates (rice, wheat, potatoes, bread), lean meats, yogurt, fruits, and vegetables.  Avoid high-fat foods because they are more difficult to digest.  Drink enough water and fluids to keep your urine clear or pale yellow.  If you are dehydrated, ask your caregiver for specific rehydration instructions. Signs of dehydration may include:  Severe thirst.  Dry lips and mouth.  Dizziness.  Dark urine.  Decreasing urine frequency and amount.  Confusion.  Rapid breathing or pulse. SEEK IMMEDIATE MEDICAL CARE IF:   You have blood or brown flecks (like coffee grounds) in your vomit.  You have black or bloody stools.  You have a severe headache or stiff neck.  You are confused.  You have severe abdominal pain.  You have chest pain or trouble breathing.  You do not urinate at least once every 8 hours.  You develop cold or clammy skin.  You continue to vomit for longer than 24 to 48 hours.  You have a fever. MAKE SURE YOU:   Understand these  instructions.  Will watch your condition.  Will get help right away if you are not doing well or get worse.   This information is not intended to replace advice given to you by your health care provider. Make sure you discuss any questions you have with your health care provider.   Document Released: 02/28/2005 Document Revised: 05/23/2011 Document Reviewed: 07/28/2010 Elsevier Interactive Patient Education 2016 Elsevier Inc.  Gastritis, Adult Gastritis is soreness and swelling (inflammation) of the lining of the stomach. Gastritis can develop as a sudden onset (acute) or long-term (chronic) condition. If gastritis is not treated, it can lead to stomach bleeding and ulcers. CAUSES  Gastritis occurs when the stomach lining is weak or damaged. Digestive juices from the stomach then inflame the weakened stomach lining. The stomach lining may be weak or damaged due to viral or bacterial infections. One common bacterial infection is the Helicobacter pylori infection. Gastritis can also result from excessive alcohol consumption, taking certain medicines, or having too much acid  in the stomach.  SYMPTOMS  In some cases, there are no symptoms. When symptoms are present, they may include:  Pain or a burning sensation in the upper abdomen.  Nausea.  Vomiting.  An uncomfortable feeling of fullness after eating. DIAGNOSIS  Your caregiver may suspect you have gastritis based on your symptoms and a physical exam. To determine the cause of your gastritis, your caregiver may perform the following:  Blood or stool tests to check for the H pylori bacterium.  Gastroscopy. A thin, flexible tube (endoscope) is passed down the esophagus and into the stomach. The endoscope has a light and camera on the end. Your caregiver uses the endoscope to view the inside of the stomach.  Taking a tissue sample (biopsy) from the stomach to examine under a microscope. TREATMENT  Depending on the cause of your  gastritis, medicines may be prescribed. If you have a bacterial infection, such as an H pylori infection, antibiotics may be given. If your gastritis is caused by too much acid in the stomach, H2 blockers or antacids may be given. Your caregiver may recommend that you stop taking aspirin, ibuprofen, or other nonsteroidal anti-inflammatory drugs (NSAIDs). HOME CARE INSTRUCTIONS  Only take over-the-counter or prescription medicines as directed by your caregiver.  If you were given antibiotic medicines, take them as directed. Finish them even if you start to feel better.  Drink enough fluids to keep your urine clear or pale yellow.  Avoid foods and drinks that make your symptoms worse, such as:  Caffeine or alcoholic drinks.  Chocolate.  Peppermint or mint flavorings.  Garlic and onions.  Spicy foods.  Citrus fruits, such as oranges, lemons, or limes.  Tomato-based foods such as sauce, chili, salsa, and pizza.  Fried and fatty foods.  Eat small, frequent meals instead of large meals. SEEK IMMEDIATE MEDICAL CARE IF:   You have black or dark red stools.  You vomit blood or material that looks like coffee grounds.  You are unable to keep fluids down.  Your abdominal pain gets worse.  You have a fever.  You do not feel better after 1 week.  You have any other questions or concerns. MAKE SURE YOU:  Understand these instructions.  Will watch your condition.  Will get help right away if you are not doing well or get worse.   This information is not intended to replace advice given to you by your health care provider. Make sure you discuss any questions you have with your health care provider.   Document Released: 02/22/2001 Document Revised: 08/30/2011 Document Reviewed: 04/13/2011 Elsevier Interactive Patient Education [employer].

## 2015-09-13 NOTE — ED Notes (Addendum)
Patient to ER for c/o pain to chest since Friday. States she ate pork and beans on Friday, vomited afterwards. States now having burning pain from throat down to midsternum. Saw cardiologist on Tuesday.

## 2015-09-17 ENCOUNTER — Other Ambulatory Visit: Payer: Self-pay | Admitting: *Deleted

## 2015-09-17 MED ORDER — DILTIAZEM HCL ER BEADS 240 MG PO CP24: 240.0000 mg | ORAL_CAPSULE | Freq: Every day | ORAL | Status: DC

## 2015-10-14 DIAGNOSIS — I10 Essential (primary) hypertension: Secondary | ICD-10-CM | POA: Diagnosis not present

## 2015-10-14 DIAGNOSIS — E782 Mixed hyperlipidemia: Secondary | ICD-10-CM | POA: Diagnosis not present

## 2015-10-14 DIAGNOSIS — I4891 Unspecified atrial fibrillation: Secondary | ICD-10-CM | POA: Diagnosis not present

## 2015-10-14 DIAGNOSIS — I259 Chronic ischemic heart disease, unspecified: Secondary | ICD-10-CM | POA: Diagnosis not present

## 2015-11-14 ENCOUNTER — Other Ambulatory Visit: Payer: Self-pay | Admitting: Family Medicine

## 2015-11-19 ENCOUNTER — Ambulatory Visit (INDEPENDENT_AMBULATORY_CARE_PROVIDER_SITE_OTHER): Payer: Medicare Other | Admitting: Family Medicine

## 2015-11-19 ENCOUNTER — Encounter: Payer: Self-pay | Admitting: Family Medicine

## 2015-11-19 VITALS — BP 130/62 | HR 63 | Temp 98.7°F | Wt 210.5 lb

## 2015-11-19 DIAGNOSIS — R8290 Unspecified abnormal findings in urine: Secondary | ICD-10-CM

## 2015-11-19 DIAGNOSIS — N3090 Cystitis, unspecified without hematuria: Secondary | ICD-10-CM

## 2015-11-19 DIAGNOSIS — R1030 Lower abdominal pain, unspecified: Secondary | ICD-10-CM

## 2015-11-19 DIAGNOSIS — J0100 Acute maxillary sinusitis, unspecified: Secondary | ICD-10-CM

## 2015-11-19 LAB — POC URINALSYSI DIPSTICK (AUTOMATED)
BILIRUBIN UA: NEGATIVE
GLUCOSE UA: NEGATIVE
Ketones, UA: NEGATIVE
NITRITE UA: POSITIVE
PH UA: 6
Protein, UA: 100
Spec Grav, UA: 1.02
UROBILINOGEN UA: 1

## 2015-11-19 MED ORDER — LEVOFLOXACIN 750 MG PO TABS: 750.0000 mg | ORAL_TABLET | Freq: Every day | ORAL | 0 refills | Status: DC

## 2015-11-19 MED ORDER — LEVOFLOXACIN 250 MG PO TABS: ORAL_TABLET | ORAL | 0 refills | Status: DC

## 2015-11-19 NOTE — Progress Notes (Signed)
Pre visit review using our clinic review tool, if applicable. No additional management support is needed unless otherwise documented below in the visit note. 

## 2015-11-19 NOTE — Patient Instructions (Signed)
If worsening pain, diarrhea or vomiting go to emergency room If not better in 2-3 days, please let us know/come back in   Urinary Tract Infection Urinary tract infections (UTIs) can develop anywhere along your urinary tract. Your urinary tract is your body's drainage system for removing wastes and extra water. Your urinary tract includes two kidneys, two ureters, a bladder, and a urethra. Your kidneys are a pair of bean-shaped organs. Each kidney is about the size of your fist. They are located below your ribs, one on each side of your spine. CAUSES Infections are caused by microbes, which are microscopic organisms, including fungi, viruses, and bacteria. These organisms are so small that they can only be seen through a microscope. Bacteria are the microbes that most commonly cause UTIs. SYMPTOMS  Symptoms of UTIs may vary by age and gender of the patient and by the location of the infection. Symptoms in young women typically include a frequent and intense urge to urinate and a painful, burning feeling in the bladder or urethra during urination. Older women and men are more likely to be tired, shaky, and weak and have muscle aches and abdominal pain. A fever may mean the infection is in your kidneys. Other symptoms of a kidney infection include pain in your back or sides below the ribs, nausea, and vomiting. DIAGNOSIS To diagnose a UTI, your caregiver will ask you about your symptoms. Your caregiver will also ask you to provide a urine sample. The urine sample will be tested for bacteria and white blood cells. White blood cells are made by your body to help fight infection. TREATMENT  Typically, UTIs can be treated with medication. Because most UTIs are caused by a bacterial infection, they usually can be treated with the use of antibiotics. The choice of antibiotic and length of treatment depend on your symptoms and the type of bacteria causing your infection. HOME CARE INSTRUCTIONS  If you were  prescribed antibiotics, take them exactly as your caregiver instructs you. Finish the medication even if you feel better after you have only taken some of the medication.  Drink enough water and fluids to keep your urine clear or pale yellow.  Avoid caffeine, tea, and carbonated beverages. They tend to irritate your bladder.  Empty your bladder often. Avoid holding urine for long periods of time.  Empty your bladder before and after sexual intercourse.  After a bowel movement, women should cleanse from front to back. Use each tissue only once. SEEK MEDICAL CARE IF:   You have back pain.  You develop a fever.  Your symptoms do not begin to resolve within 3 days. SEEK IMMEDIATE MEDICAL CARE IF:   You have severe back pain or lower abdominal pain.  You develop chills.  You have nausea or vomiting.  You have continued burning or discomfort with urination. MAKE SURE YOU:   Understand these instructions.  Will watch your condition.  Will get help right away if you are not doing well or get worse.   This information is not intended to replace advice given to you by your health care provider. Make sure you discuss any questions you have with your health care provider.   Document Released: 12/08/2004 Document Revised: 11/19/2014 Document Reviewed: 04/08/2011 Elsevier Interactive Patient Education [employer].
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
## 2015-11-22 LAB — URINE CULTURE

## 2015-11-26 ENCOUNTER — Encounter: Payer: Self-pay | Admitting: Family Medicine

## 2016-01-14 ENCOUNTER — Other Ambulatory Visit: Payer: Self-pay | Admitting: Family Medicine

## 2016-01-14 NOTE — Telephone Encounter (Signed)
Please call in.  Thanks.   

## 2016-01-14 NOTE — Telephone Encounter (Signed)
Electronic refill request. Last Filled:    60 tablet 1 09/07/2015  Last office visit:   09/09/15  Please advise.

## 2016-01-15 NOTE — Telephone Encounter (Signed)
Medication phoned to pharmacy.  

## 2016-01-19 ENCOUNTER — Telehealth: Payer: Self-pay | Admitting: Family Medicine

## 2016-01-19 NOTE — Telephone Encounter (Signed)
TH scheduled pt for 01/20/16 at 11:30 with Dr Deborra Medina.

## 2016-01-19 NOTE — Telephone Encounter (Signed)
[facility]  Patient Name: [name]  DOB: June 20, 1935    Initial Comment caller states she is feeling dizzy and weak    Nurse Assessment  Nurse: Wynetta Emery, RN, Baker Janus Date/Time [name] Time): 01/19/2016 10:08:25 AM  Confirm and document reason for call. If symptomatic, describe symptoms. You must click the next button to save text entered. ---[name] is having dizziness and weakness -- not feeling well not eating much  Has the patient traveled out of the country within the last 30 days? ---No  Does the patient have any new or worsening symptoms? ---Yes  Will a triage be completed? ---Yes  Related visit to physician within the last 2 weeks? ---No  Does the PT have any chronic conditions? (i.e. diabetes, asthma, etc.) ---No  Is this a behavioral health or substance abuse call? ---No     Guidelines    Guideline Title Affirmed Question Affirmed Notes  Dizziness - Lightheadedness [1] MODERATE dizziness (e.g., interferes with normal activities) AND [2] has NOT been evaluated by physician for this (Exception: dizziness caused by heat exposure, sudden standing, or poor fluid intake)    Final Disposition User   See Physician within 24 Hours Wynetta Emery, RN, Baker Janus    Comments  NOTE [name] is off tomorrow 11 -8- 2017 appt time given at 61 with Dr. Arnette Norris for dizziness, fatigue weakness not eating drinking   Referrals  REFERRED TO PCP OFFICE   Disagree/Comply: Comply

## 2016-01-20 ENCOUNTER — Encounter: Payer: Self-pay | Admitting: Family Medicine

## 2016-01-20 ENCOUNTER — Ambulatory Visit (INDEPENDENT_AMBULATORY_CARE_PROVIDER_SITE_OTHER): Payer: Medicare Other | Admitting: Family Medicine

## 2016-01-20 DIAGNOSIS — R5383 Other fatigue: Secondary | ICD-10-CM | POA: Diagnosis not present

## 2016-01-20 DIAGNOSIS — R42 Dizziness and giddiness: Secondary | ICD-10-CM | POA: Diagnosis not present

## 2016-01-20 LAB — COMPREHENSIVE METABOLIC PANEL
ALK PHOS: 85 U/L (ref 39–117)
ALT: 17 U/L (ref 0–35)
AST: 26 U/L (ref 0–37)
Albumin: 4 g/dL (ref 3.5–5.2)
BILIRUBIN TOTAL: 0.5 mg/dL (ref 0.2–1.2)
BUN: 17 mg/dL (ref 6–23)
CO2: 30 meq/L (ref 19–32)
CREATININE: 0.91 mg/dL (ref 0.40–1.20)
Calcium: 9.5 mg/dL (ref 8.4–10.5)
Chloride: 104 mEq/L (ref 96–112)
GFR: 76.88 mL/min (ref >60.00)
Glucose, Bld: 81 mg/dL (ref 70–99)
Potassium: 4 mEq/L (ref 3.5–5.1)
Sodium: 140 mEq/L (ref 135–145)
TOTAL PROTEIN: 7.9 g/dL (ref 6.0–8.3)

## 2016-01-20 LAB — CBC WITH DIFFERENTIAL/PLATELET
BASOS ABS: 0 10*3/uL (ref 0.0–0.1)
Basophils Relative: 0.4 % (ref 0.0–3.0)
EOS ABS: 0 10*3/uL (ref 0.0–0.7)
Eosinophils Relative: 0.7 % (ref 0.0–5.0)
HCT: 41.3 % (ref 36.0–46.0)
Hemoglobin: 13.7 g/dL (ref 12.0–15.0)
LYMPHS ABS: 2.1 10*3/uL (ref 0.7–4.0)
LYMPHS PCT: 31.3 % (ref 12.0–46.0)
MCHC: 33.2 g/dL (ref 30.0–36.0)
MCV: 84.7 fl (ref 78.0–100.0)
MONOS PCT: 7.9 % (ref 3.0–12.0)
Monocytes Absolute: 0.5 10*3/uL (ref 0.1–1.0)
NEUTROS ABS: 4.1 10*3/uL (ref 1.4–7.7)
NEUTROS PCT: 59.7 % (ref 43.0–77.0)
PLATELETS: 214 10*3/uL (ref 150.0–400.0)
RBC: 4.88 Mil/uL (ref 3.87–5.11)
RDW: 15.5 % (ref 11.5–15.5)
WBC: 6.8 10*3/uL (ref 4.0–10.5)

## 2016-01-20 LAB — TSH: TSH: 1.81 u[IU]/mL (ref 0.35–4.50)

## 2016-01-20 NOTE — Patient Instructions (Signed)
Great to see you.  We will call you with your lab results. 

## 2016-01-20 NOTE — Assessment & Plan Note (Signed)
New- with symptoms of orthostasis. We called her cardiologist to get her an appt for later today- I do think her rxs need to be decreased (beta blockers). Check labs today to rule out other contributing factors. Orders Placed This Encounter  Procedures  . CBC with Differential/Platelet  . Comprehensive metabolic panel  . TSH

## 2016-01-20 NOTE — Progress Notes (Signed)
Pre visit review using our clinic review tool, if applicable. No additional management support is needed unless otherwise documented below in the visit note. 
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
## 2016-01-21 ENCOUNTER — Telehealth: Payer: Self-pay | Admitting: Family Medicine

## 2016-01-21 NOTE — Telephone Encounter (Signed)
Pt returned call - please call [phone]

## 2016-01-21 NOTE — Telephone Encounter (Signed)
See results note. 

## 2016-01-22 DIAGNOSIS — I10 Essential (primary) hypertension: Secondary | ICD-10-CM | POA: Diagnosis not present

## 2016-01-22 DIAGNOSIS — E782 Mixed hyperlipidemia: Secondary | ICD-10-CM | POA: Diagnosis not present

## 2016-01-22 DIAGNOSIS — I259 Chronic ischemic heart disease, unspecified: Secondary | ICD-10-CM | POA: Diagnosis not present

## 2016-01-28 ENCOUNTER — Other Ambulatory Visit: Payer: Self-pay | Admitting: Family Medicine

## 2016-02-05 ENCOUNTER — Emergency Department
Admission: EM | Admit: 2016-02-05 | Discharge: 2016-02-06 | Disposition: A | Discharge status: Home / Self Care | Payer: Medicare Other | Attending: Emergency Medicine | Admitting: Emergency Medicine

## 2016-02-05 DIAGNOSIS — M79672 Pain in left foot: Secondary | ICD-10-CM

## 2016-02-05 DIAGNOSIS — I10 Essential (primary) hypertension: Secondary | ICD-10-CM | POA: Insufficient documentation

## 2016-02-05 DIAGNOSIS — I2510 Atherosclerotic heart disease of native coronary artery without angina pectoris: Secondary | ICD-10-CM | POA: Insufficient documentation

## 2016-02-05 DIAGNOSIS — M79605 Pain in left leg: Secondary | ICD-10-CM | POA: Diagnosis not present

## 2016-02-05 DIAGNOSIS — J45909 Unspecified asthma, uncomplicated: Secondary | ICD-10-CM | POA: Insufficient documentation

## 2016-02-05 DIAGNOSIS — M7752 Other enthesopathy of left foot: Secondary | ICD-10-CM

## 2016-02-05 DIAGNOSIS — Z7982 Long term (current) use of aspirin: Secondary | ICD-10-CM | POA: Diagnosis not present

## 2016-02-05 DIAGNOSIS — Z79899 Other long term (current) drug therapy: Secondary | ICD-10-CM | POA: Insufficient documentation

## 2016-02-05 DIAGNOSIS — M898X7 Other specified disorders of bone, ankle and foot: Secondary | ICD-10-CM | POA: Insufficient documentation

## 2016-02-06 ENCOUNTER — Emergency Department: Payer: Medicare Other

## 2016-02-06 DIAGNOSIS — M79672 Pain in left foot: Secondary | ICD-10-CM | POA: Diagnosis not present

## 2016-02-06 DIAGNOSIS — M79605 Pain in left leg: Secondary | ICD-10-CM | POA: Diagnosis not present

## 2016-02-06 MED ORDER — MORPHINE SULFATE (PF) 4 MG/ML IV SOLN
2.0000 mg | Freq: Once | INTRAVENOUS | Status: AC
  Administered 2016-02-06: 2 mg via INTRAVENOUS

## 2016-02-06 MED ORDER — MORPHINE SULFATE (PF) 4 MG/ML IV SOLN
2.0000 mg | Freq: Once | INTRAVENOUS | Status: DC
Start: 2016-02-06 — End: 2016-02-06
  Filled 2016-02-06: qty 1

## 2016-02-06 MED ORDER — ONDANSETRON 4 MG PO TBDP
4.0000 mg | ORAL_TABLET | Freq: Once | ORAL | Status: AC
  Administered 2016-02-06: 4 mg via ORAL
  Filled 2016-02-06: qty 1

## 2016-02-06 MED ORDER — KETOROLAC TROMETHAMINE 30 MG/ML IJ SOLN
10.0000 mg | Freq: Once | INTRAMUSCULAR | Status: AC
  Administered 2016-02-06: 9.9 mg via INTRAVENOUS

## 2016-02-06 MED ORDER — HYDROCODONE-ACETAMINOPHEN 5-325 MG PO TABS: 1.0000 | ORAL_TABLET | Freq: Four times a day (QID) | ORAL | 0 refills | Status: DC | PRN

## 2016-02-06 MED ORDER — KETOROLAC TROMETHAMINE 30 MG/ML IJ SOLN
30.0000 mg | Freq: Once | INTRAMUSCULAR | Status: DC
  Filled 2016-02-06: qty 1

## 2016-02-06 NOTE — ED Triage Notes (Signed)
Pt presents to ED via [facility] from home with c/o LEFT leg, knee and foot pain x2 weeks. Pt reports pain increased significantly 2 hrs ago and decided to come to the ED for evaluation. Pt's LEFT foot is extremely tender to touch, no redness, warmth, swelling or deformity noted. Pt denies any recent falls, injury or trauma.
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
## 2016-02-06 NOTE — Discharge Instructions (Signed)
1. Wear podiatric shoe and use your walker for balance as you walk. 2. You may take pain medicine as needed (Norco #15). 3. Elevate affected area several times daily. 4. Return to the ER for worsening symptoms, persistent vomiting, difficulty breathing or other concerns.

## 2016-02-09 DIAGNOSIS — M10072 Idiopathic gout, left ankle and foot: Secondary | ICD-10-CM | POA: Diagnosis not present

## 2016-04-08 ENCOUNTER — Ambulatory Visit (INDEPENDENT_AMBULATORY_CARE_PROVIDER_SITE_OTHER): Payer: Medicare HMO

## 2016-04-08 DIAGNOSIS — Z23 Encounter for immunization: Secondary | ICD-10-CM

## 2016-05-02 ENCOUNTER — Other Ambulatory Visit: Payer: Self-pay | Admitting: Family Medicine

## 2016-05-02 DIAGNOSIS — Z1231 Encounter for screening mammogram for malignant neoplasm of breast: Secondary | ICD-10-CM

## 2016-05-05 ENCOUNTER — Emergency Department: Payer: Medicare HMO

## 2016-05-05 ENCOUNTER — Telehealth: Payer: Self-pay

## 2016-05-05 ENCOUNTER — Emergency Department
Admission: EM | Admit: 2016-05-05 | Discharge: 2016-05-05 | Disposition: A | Discharge status: Home / Self Care | Payer: Medicare HMO | Attending: Emergency Medicine | Admitting: Emergency Medicine

## 2016-05-05 ENCOUNTER — Ambulatory Visit: Payer: Medicare HMO | Admitting: Family Medicine

## 2016-05-05 DIAGNOSIS — J45909 Unspecified asthma, uncomplicated: Secondary | ICD-10-CM | POA: Diagnosis not present

## 2016-05-05 DIAGNOSIS — Y999 Unspecified external cause status: Secondary | ICD-10-CM | POA: Diagnosis not present

## 2016-05-05 DIAGNOSIS — Y9389 Activity, other specified: Secondary | ICD-10-CM | POA: Insufficient documentation

## 2016-05-05 DIAGNOSIS — R42 Dizziness and giddiness: Secondary | ICD-10-CM | POA: Diagnosis not present

## 2016-05-05 DIAGNOSIS — I2510 Atherosclerotic heart disease of native coronary artery without angina pectoris: Secondary | ICD-10-CM | POA: Diagnosis not present

## 2016-05-05 DIAGNOSIS — W01198A Fall on same level from slipping, tripping and stumbling with subsequent striking against other object, initial encounter: Secondary | ICD-10-CM | POA: Diagnosis not present

## 2016-05-05 DIAGNOSIS — Z79899 Other long term (current) drug therapy: Secondary | ICD-10-CM | POA: Insufficient documentation

## 2016-05-05 DIAGNOSIS — S99191A Other physeal fracture of right metatarsal, initial encounter for closed fracture: Secondary | ICD-10-CM

## 2016-05-05 DIAGNOSIS — Y92002 Bathroom of unspecified non-institutional (private) residence single-family (private) house as the place of occurrence of the external cause: Secondary | ICD-10-CM | POA: Insufficient documentation

## 2016-05-05 DIAGNOSIS — I10 Essential (primary) hypertension: Secondary | ICD-10-CM | POA: Diagnosis not present

## 2016-05-05 DIAGNOSIS — S99921A Unspecified injury of right foot, initial encounter: Secondary | ICD-10-CM | POA: Diagnosis present

## 2016-05-05 DIAGNOSIS — S62316A Displaced fracture of base of fifth metacarpal bone, right hand, initial encounter for closed fracture: Secondary | ICD-10-CM | POA: Diagnosis not present

## 2016-05-05 DIAGNOSIS — S92351A Displaced fracture of fifth metatarsal bone, right foot, initial encounter for closed fracture: Secondary | ICD-10-CM | POA: Insufficient documentation

## 2016-05-05 DIAGNOSIS — W19XXXA Unspecified fall, initial encounter: Secondary | ICD-10-CM

## 2016-05-05 LAB — CBC
HCT: 39.9 % (ref 35.0–47.0)
Hemoglobin: 13.7 g/dL (ref 12.0–16.0)
MCH: 28.9 pg (ref 26.0–34.0)
MCHC: 34.3 g/dL (ref 32.0–36.0)
MCV: 84.4 fL (ref 80.0–100.0)
PLATELETS: 215 10*3/uL (ref 150–440)
RBC: 4.72 MIL/uL (ref 3.80–5.20)
RDW: 15.2 % — AB (ref 11.5–14.5)
WBC: 7.9 10*3/uL (ref 3.6–11.0)

## 2016-05-05 LAB — URINALYSIS, COMPLETE (UACMP) WITH MICROSCOPIC
BILIRUBIN URINE: NEGATIVE
Bacteria, UA: NONE SEEN
GLUCOSE, UA: NEGATIVE mg/dL
Hgb urine dipstick: NEGATIVE
KETONES UR: 20 mg/dL — AB
Leukocytes, UA: NEGATIVE
Nitrite: NEGATIVE
PH: 5 (ref 5.0–8.0)
Protein, ur: NEGATIVE mg/dL
RBC / HPF: NONE SEEN RBC/hpf (ref 0–5)
SPECIFIC GRAVITY, URINE: 1.013 (ref 1.005–1.030)

## 2016-05-05 LAB — BASIC METABOLIC PANEL
Anion gap: 8 (ref 5–15)
BUN: 16 mg/dL (ref 6–20)
CALCIUM: 9 mg/dL (ref 8.9–10.3)
CO2: 23 mmol/L (ref 22–32)
CREATININE: 0.87 mg/dL (ref 0.44–1.00)
Chloride: 104 mmol/L (ref 101–111)
Glucose, Bld: 82 mg/dL (ref 65–99)
Potassium: 4.6 mmol/L (ref 3.5–5.1)
SODIUM: 135 mmol/L (ref 135–145)

## 2016-05-05 LAB — TROPONIN I: Troponin I: <0.03 ng/mL (ref <0.03)

## 2016-05-05 LAB — HEPATIC FUNCTION PANEL
ALBUMIN: 4.2 g/dL (ref 3.5–5.0)
ALK PHOS: 83 U/L (ref 38–126)
ALT: 24 U/L (ref 14–54)
AST: 37 U/L (ref 15–41)
BILIRUBIN TOTAL: 1.1 mg/dL (ref 0.3–1.2)
Bilirubin, Direct: 0.1 mg/dL (ref 0.1–0.5)
Indirect Bilirubin: 1 mg/dL — ABNORMAL HIGH (ref 0.3–0.9)
TOTAL PROTEIN: 7.8 g/dL (ref 6.5–8.1)

## 2016-05-05 LAB — LIPASE, BLOOD: Lipase: 25 U/L (ref 11–51)

## 2016-05-05 MED ORDER — ACETAMINOPHEN 500 MG PO TABS
1000.0000 mg | ORAL_TABLET | Freq: Once | ORAL | Status: AC
  Administered 2016-05-05: 1000 mg via ORAL
  Filled 2016-05-05: qty 2

## 2016-05-05 NOTE — ED Triage Notes (Signed)
Pt states she fell in the shower this morning and injured her right ankle. Pt states she has been weak and having dizziness for the past week.. Pt is a/ox3 on arrival..
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
## 2016-05-05 NOTE — Telephone Encounter (Signed)
Pt called she has appt today at 11:45 to see Dr Deborra Medina for back pain and stomach issue but pt fell in shower and rt foot has knot on outside of foot; pt can bear wt on foot but very painful. Dr Deborra Medina advised she is not orthopedic and Dr Lorelei Pont is not in office today and suggest pt go to ED for foot and reschedule appt for back pain and stomach. Pt notified and she will have her daughter take her to [facility]. Pt will get grandson to put ice pack on rt foot and elevate rt leg. Pt will either get ED to ck back and stomach also or will cb for appt. FYI to Dr Deborra Medina.

## 2016-05-05 NOTE — Discharge Instructions (Signed)
Use the walking boot until you are cleared by Dr. Cleda Mccreedy. Follow-up with her in a week. Use your walker for ambulation. Avoid weightbearing onto the Right foot. Take Tylenol 1000 mg every 8 hours for pain. Elevate and put ice on it. Follow-up with your primary care doctor in 2 days for further evaluation of her dizzy spells.

## 2016-05-05 NOTE — ED Notes (Signed)
Red top and blue top sent to lab

## 2016-05-06 ENCOUNTER — Other Ambulatory Visit: Payer: Self-pay | Admitting: Family Medicine

## 2016-05-06 ENCOUNTER — Telehealth: Payer: Self-pay | Admitting: Family Medicine

## 2016-05-06 NOTE — Telephone Encounter (Signed)
Noted. All labs completed in the ED yesterday are reassuring, no infectious process or electrolyte imbalance. She could try Pepto Bismol OTC for her symptoms of discomfort and diarrhea. Please note to her that this may cause her stools to turn a dark color. She will see [name] on 05/09/16. If she gets worse over the weekend, then go to the ED.

## 2016-05-06 NOTE — Telephone Encounter (Deleted)
Message left for patient to return my call.  

## 2016-05-06 NOTE — Telephone Encounter (Signed)
Patient Name: [name] DOB: 19-Nov-1935 Initial Comment caller states she has severe abd pain and diarrhea Nurse Assessment Nurse: Ronnald Ramp, RN, Miranda Date/Time (Eastern Time): 05/06/2016 11:57:26 AM Confirm and document reason for call. If symptomatic, describe symptoms. ---Caller states she has had diarrhea for 2-3 days. Also with abdominal. Seen in ED yesterday due to a fall and has a broken foot. Does the patient have any new or worsening symptoms? ---Yes Will a triage be completed? ---Yes Related visit to physician within the last 2 weeks? ---No Does the PT have any chronic conditions? (i.e. diabetes, asthma, etc.) ---Yes List chronic conditions. ---Anxiety, Osteoporosis, Chronic diarrhea Is this a behavioral health or substance abuse call? ---No Guidelines Guideline Title Affirmed Question Affirmed Notes Diarrhea [1] SEVERE abdominal pain (e.g., excruciating) AND [2] present > 1 hour Final Disposition User Go to ED Now Ronnald Ramp, RN, Miranda Comments Caller states she has 10/10 pain in her abdomen. She is requesting medication to help with stomach pain. No standing orders for Bental, which I think would help. She asked if MD could be notified for medications. She did agree to try to get her daughter to take her to the ED. Appt scheduled for Monday with [name] at 12pm Referrals [facility] - ED Disagree/Comply: Comply

## 2016-05-06 NOTE — Telephone Encounter (Signed)
Called in medication to the pharmacy as instructed. 

## 2016-05-06 NOTE — Telephone Encounter (Signed)
Received refill request electronically Last refill #60/1 Last office visit 01/20/16/acute Upcoming appointment 05/09/16

## 2016-05-06 NOTE — Telephone Encounter (Signed)
Pt called stating she did break her foot and she has cast on it

## 2016-05-06 NOTE — Telephone Encounter (Signed)
Spoken and notified patient of Kate's comments. Patient verbalized understanding. 

## 2016-05-06 NOTE — Telephone Encounter (Signed)
Pt has appt on 05/09/16 at 12 noon to see Dr Damita Dunnings.

## 2016-05-08 NOTE — Telephone Encounter (Signed)
Agree. Thanks

## 2016-05-09 ENCOUNTER — Ambulatory Visit (INDEPENDENT_AMBULATORY_CARE_PROVIDER_SITE_OTHER): Payer: Medicare HMO | Admitting: Family Medicine

## 2016-05-09 ENCOUNTER — Encounter: Payer: Self-pay | Admitting: Family Medicine

## 2016-05-09 VITALS — BP 126/70 | HR 60 | Temp 98.4°F | Wt 196.0 lb

## 2016-05-09 DIAGNOSIS — R1084 Generalized abdominal pain: Secondary | ICD-10-CM

## 2016-05-09 DIAGNOSIS — S92901S Unspecified fracture of right foot, sequela: Secondary | ICD-10-CM

## 2016-05-09 NOTE — Progress Notes (Signed)
Pre visit review using our clinic review tool, if applicable. No additional management support is needed unless otherwise documented below in the visit note. 

## 2016-05-09 NOTE — Progress Notes (Signed)
She was going to come up here re: diarrhea last week but then fell and needed to go to ER, dx'd with foot fx and R foot booted now. She got lightheaded in the shower and fell previously.  D/w pt about fall cautions.  She has walker to use.     Diarrhea and abd pain recently noted, going on for months.  She was getting lightheaded prev.  Still having diarrhea, off and on, but not as bad as prev.  The lightheadedness is better now.    No blood in stool.  No fevers.  Diffuse abd pain.  Pain is present most of the time, off and on, but present most of the time.  No vomiting.  Some nausea.  She was able to eat recently.    No one else is sick.  She seems to have more pain with eating.  She notes "rolling" in the stomach when she eats.    PMH and SH reviewed  ROS: Per HPI unless specifically indicated in ROS section   Meds, vitals, and allergies reviewed.   GEN: nad, alert and oriented HEENT: mucous membranes moist NECK: supple w/o LA CV: rrr.  PULM: ctab, no inc wob ABD: soft, +bs, diffusely but mildly tender to palpation in all 4 quadrants without rebound. No masses appreciated. EXT: no edema SKIN: no acute rash Right foot in a boot

## 2016-05-09 NOTE — Patient Instructions (Signed)
Don't change your meds for now.  Take care.  Glad to see you.  Go to the lab on the way out.  We'll contact you with your lab report. Rosaria Ferries will call about your referral.

## 2016-05-10 DIAGNOSIS — S92901A Unspecified fracture of right foot, initial encounter for closed fracture: Secondary | ICD-10-CM | POA: Insufficient documentation

## 2016-05-10 NOTE — Assessment & Plan Note (Signed)
right fifth metatarsal fx, previously booted. Using walker.

## 2016-05-10 NOTE — Assessment & Plan Note (Signed)
She had recent labs done. Reviewed. No specific findings. Ongoing symptoms. It may be that she was lightheaded because of the abdominal pain and this led to the fall. She is not lightheaded now. Still appears okay for outpatient follow-up. Check stool cards to make sure she is not passing blood. No gross blood in stools noted by patient. Check CT abdomen and pelvis given her findings. Unclear to me if she has chronic mild mesenteric ischemia causing worsening of symptoms when she eats. At this point still okay for outpatient follow-up. >25 minutes spent in face to face time with patient, >50% spent in counselling or coordination of care.

## 2016-05-12 ENCOUNTER — Ambulatory Visit (INDEPENDENT_AMBULATORY_CARE_PROVIDER_SITE_OTHER): Payer: Medicare HMO

## 2016-05-12 ENCOUNTER — Ambulatory Visit (INDEPENDENT_AMBULATORY_CARE_PROVIDER_SITE_OTHER): Payer: Medicare HMO | Admitting: Podiatry

## 2016-05-12 ENCOUNTER — Encounter: Payer: Self-pay | Admitting: Podiatry

## 2016-05-12 DIAGNOSIS — S92301A Fracture of unspecified metatarsal bone(s), right foot, initial encounter for closed fracture: Secondary | ICD-10-CM

## 2016-05-12 NOTE — Progress Notes (Signed)
   Subjective:    Patient ID: [name], female    DOB: February 20, 1936, 81 y.o.   MRN: [id number]  HPI  Ms. Broman presents to the office today for concerns of a fracture to the right foot. She states that she fell in the shower last Thursday. She went to the ER and had x-rays which revealed a fracture of the 5th metatarsal base. She was given a CAM boot and she presents today for follow-up. She states that she has the majority of pain to the outside aspect of her foot.  No other complaints at this time.    Review of Systems  Constitutional: Positive for appetite change.  HENT: Positive for sinus pressure.   Respiratory: Positive for cough.   Gastrointestinal: Positive for abdominal pain.  All other systems reviewed and are negative.      Objective:   Physical Exam General: AAO x3, NAD  Dermatological: Skin is warm, dry and supple bilateral. There are no open sores, no preulcerative lesions, no rash or signs of infection present.  Vascular: Dorsalis Pedis artery and Posterior Tibial artery pedal pulses are 1/4 bilateral with immedate capillary fill time. Pedal hair growth present. There is no pain with calf compression, swelling, warmth, erythema.   Neruologic: Grossly intact via light touch bilateral. Vibratory intact via tuning fork bilateral. Protective threshold with Semmes Wienstein monofilament intact to all pedal sites bilateral.   Musculoskeletal: There is tenderness to palpation to the base of the right 5th metatarsal. There is mild swelling to this area. No skin breakdown. There are no other areas of pinpoint tenderness to the foot or ankle bilaterally. No pain with ankle or STJ ROM.   Gait: Presents ambulating with a walker in a surgical boot.       Assessment & Plan:  81 year old female with right 5th metatarsal base fracture.  -Treatment options discussed including all alternatives, risks, and complications -Etiology of symptoms were discussed -Chart and x-rays were  reviewed. New x-rays were obtained today as well which did reveal a fracture of the 5th metatarsal base. Osteopenia is present -Discussed both conservative and surgical treatment with the patient.  -Will continue conservative treatment for now. Continue with CAM boot at all times. Hopefully this will heal uneventfully.  -Discussed obtaining arterial studies given decreased pulses but she denies any claudication symptoms so we will hold of for now.  -RTC in 3 weeks or sooner if needed.   Celesta Gentile, DPM

## 2016-05-16 ENCOUNTER — Ambulatory Visit: Payer: Self-pay | Admitting: Podiatry

## 2016-05-17 ENCOUNTER — Ambulatory Visit
Admission: RE | Admit: 2016-05-17 | Discharge: 2016-05-17 | Disposition: A | Discharge status: Home / Self Care | Payer: Medicare HMO | Source: Ambulatory Visit | Attending: Family Medicine | Admitting: Family Medicine

## 2016-05-17 DIAGNOSIS — I700 Atherosclerosis of aorta: Secondary | ICD-10-CM | POA: Diagnosis not present

## 2016-05-17 DIAGNOSIS — R1084 Generalized abdominal pain: Secondary | ICD-10-CM | POA: Diagnosis present

## 2016-05-17 DIAGNOSIS — K8020 Calculus of gallbladder without cholecystitis without obstruction: Secondary | ICD-10-CM | POA: Insufficient documentation

## 2016-05-17 DIAGNOSIS — R109 Unspecified abdominal pain: Secondary | ICD-10-CM | POA: Diagnosis not present

## 2016-05-17 DIAGNOSIS — K5730 Diverticulosis of large intestine without perforation or abscess without bleeding: Secondary | ICD-10-CM | POA: Insufficient documentation

## 2016-05-17 DIAGNOSIS — K753 Granulomatous hepatitis, not elsewhere classified: Secondary | ICD-10-CM | POA: Diagnosis not present

## 2016-05-17 DIAGNOSIS — R197 Diarrhea, unspecified: Secondary | ICD-10-CM | POA: Diagnosis not present

## 2016-05-17 MED ORDER — IOPAMIDOL (ISOVUE-300) INJECTION 61%
100.0000 mL | Freq: Once | INTRAVENOUS | Status: AC | PRN
  Administered 2016-05-17: 100 mL via INTRAVENOUS

## 2016-05-19 ENCOUNTER — Other Ambulatory Visit (INDEPENDENT_AMBULATORY_CARE_PROVIDER_SITE_OTHER): Payer: Medicare HMO

## 2016-05-19 ENCOUNTER — Telehealth: Payer: Self-pay | Admitting: Family Medicine

## 2016-05-19 DIAGNOSIS — R1084 Generalized abdominal pain: Secondary | ICD-10-CM | POA: Diagnosis not present

## 2016-05-19 LAB — FECAL OCCULT BLOOD, IMMUNOCHEMICAL: FECAL OCCULT BLD: NEGATIVE

## 2016-05-19 NOTE — Telephone Encounter (Signed)
Pt due for AWV and CPE. Called pt, no answer, just kept ringing.

## 2016-05-26 ENCOUNTER — Other Ambulatory Visit: Payer: Self-pay | Admitting: Family Medicine

## 2016-05-26 DIAGNOSIS — R1084 Generalized abdominal pain: Secondary | ICD-10-CM

## 2016-05-30 ENCOUNTER — Ambulatory Visit: Payer: Medicare HMO

## 2016-06-02 ENCOUNTER — Ambulatory Visit
Admission: RE | Admit: 2016-06-02 | Discharge: 2016-06-02 | Disposition: A | Discharge status: Home / Self Care | Payer: Medicare HMO | Source: Ambulatory Visit | Attending: Family Medicine | Admitting: Family Medicine

## 2016-06-02 DIAGNOSIS — Z1231 Encounter for screening mammogram for malignant neoplasm of breast: Secondary | ICD-10-CM | POA: Diagnosis not present

## 2016-06-02 DIAGNOSIS — R928 Other abnormal and inconclusive findings on diagnostic imaging of breast: Secondary | ICD-10-CM | POA: Insufficient documentation

## 2016-06-03 ENCOUNTER — Other Ambulatory Visit: Payer: Self-pay | Admitting: Family Medicine

## 2016-06-03 DIAGNOSIS — N6489 Other specified disorders of breast: Secondary | ICD-10-CM

## 2016-06-03 DIAGNOSIS — R928 Other abnormal and inconclusive findings on diagnostic imaging of breast: Secondary | ICD-10-CM

## 2016-06-07 ENCOUNTER — Ambulatory Visit (INDEPENDENT_AMBULATORY_CARE_PROVIDER_SITE_OTHER): Payer: Medicare HMO | Admitting: Podiatry

## 2016-06-07 ENCOUNTER — Ambulatory Visit: Payer: Medicare HMO

## 2016-06-07 ENCOUNTER — Encounter: Payer: Self-pay | Admitting: Podiatry

## 2016-06-07 DIAGNOSIS — S92301D Fracture of unspecified metatarsal bone(s), right foot, subsequent encounter for fracture with routine healing: Secondary | ICD-10-CM | POA: Diagnosis not present

## 2016-06-07 DIAGNOSIS — S92301A Fracture of unspecified metatarsal bone(s), right foot, initial encounter for closed fracture: Secondary | ICD-10-CM

## 2016-06-07 DIAGNOSIS — R600 Localized edema: Secondary | ICD-10-CM | POA: Diagnosis not present

## 2016-06-07 NOTE — Progress Notes (Signed)
Subjective: 81 year old female presents today for follow-up evaluation of a fifth metatarsal fracture to the right foot. Patient states that approximately 5 weeks ago she fell in her bath/shower. Patient sustained a fifth metatarsal fracture to the right foot and has been managed in the past by Dr. Jacqualyn Posey. Patient states that she's doing very well. She says there is no swelling like it used to be. Patient does experience discomfort and pain with a cam boot.  Objective: Moderate pain on palpation noted to the base of the fifth metatarsal right foot consistent with metatarsal fracture. Minimal edema noted.  Assessment: Fifth metatarsal fracture right foot with routine healing  Plan of care: Today the patient was evaluated. Short cam boot was dispensed today. Patient can discontinue wearing the tall cam boot. Compression anklet dispensed. Continue minimal weightbearing in the short cam boot. Return to clinic in 4 weeks for follow-up radiographs  Edrick Kins, DPM [facility]  Dr. Edrick Kins, [address]                                        [place], [place] [address]                Office [phone]  Fax [phone]

## 2016-06-14 ENCOUNTER — Ambulatory Visit
Admission: RE | Admit: 2016-06-14 | Discharge: 2016-06-14 | Disposition: A | Discharge status: Home / Self Care | Payer: Medicare HMO | Source: Ambulatory Visit | Attending: Family Medicine | Admitting: Family Medicine

## 2016-06-14 DIAGNOSIS — N6489 Other specified disorders of breast: Secondary | ICD-10-CM

## 2016-06-14 DIAGNOSIS — R928 Other abnormal and inconclusive findings on diagnostic imaging of breast: Secondary | ICD-10-CM | POA: Diagnosis not present

## 2016-06-15 ENCOUNTER — Other Ambulatory Visit: Payer: Self-pay | Admitting: Primary Care

## 2016-06-16 ENCOUNTER — Ambulatory Visit: Payer: Medicare HMO | Admitting: Gastroenterology

## 2016-06-16 NOTE — Telephone Encounter (Signed)
Ok to refill? Electronically refill request for ALPRAZolam (XANAX) 0.5 MG tablet #60 with no refills. Last prescribed by Anda Kraft on 05/06/2016. Last seen on 05/09/2016

## 2016-06-17 NOTE — Telephone Encounter (Signed)
Please call in.  Thanks.   

## 2016-06-17 NOTE — Telephone Encounter (Signed)
Rx called in as prescribed 

## 2016-06-21 ENCOUNTER — Ambulatory Visit: Payer: Medicare HMO | Admitting: Gastroenterology

## 2016-06-22 ENCOUNTER — Other Ambulatory Visit
Admission: RE | Admit: 2016-06-22 | Discharge: 2016-06-22 | Disposition: A | Discharge status: Home / Self Care | Payer: Medicare HMO | Source: Ambulatory Visit | Attending: Gastroenterology | Admitting: Gastroenterology

## 2016-06-22 ENCOUNTER — Ambulatory Visit (INDEPENDENT_AMBULATORY_CARE_PROVIDER_SITE_OTHER): Payer: Medicare HMO | Admitting: Gastroenterology

## 2016-06-22 ENCOUNTER — Encounter: Payer: Self-pay | Admitting: Gastroenterology

## 2016-06-22 VITALS — BP 134/74 | HR 50 | Temp 97.7°F | Ht 68.0 in | Wt 201.0 lb

## 2016-06-22 DIAGNOSIS — K580 Irritable bowel syndrome with diarrhea: Secondary | ICD-10-CM | POA: Diagnosis not present

## 2016-06-22 DIAGNOSIS — R195 Other fecal abnormalities: Secondary | ICD-10-CM | POA: Insufficient documentation

## 2016-06-22 LAB — TSH: TSH: 1.477 u[IU]/mL (ref 0.350–4.500)
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
## 2016-06-24 ENCOUNTER — Telehealth: Payer: Self-pay | Admitting: Gastroenterology

## 2016-06-24 ENCOUNTER — Other Ambulatory Visit
Admission: RE | Admit: 2016-06-24 | Discharge: 2016-06-24 | Disposition: A | Discharge status: Home / Self Care | Payer: Medicare HMO | Source: Ambulatory Visit | Attending: Gastroenterology | Admitting: Gastroenterology

## 2016-06-24 ENCOUNTER — Telehealth: Payer: Self-pay

## 2016-06-24 DIAGNOSIS — R195 Other fecal abnormalities: Secondary | ICD-10-CM | POA: Diagnosis not present

## 2016-06-24 LAB — C DIFFICILE QUICK SCREEN W PCR REFLEX
C DIFFICILE (CDIFF) INTERP: NOT DETECTED
C Diff antigen: NEGATIVE
C Diff toxin: NEGATIVE

## 2016-06-24 LAB — CELIAC DISEASE PANEL
ENDOMYSIAL ANTIBODY IGA: NEGATIVE
IGA: 85 mg/dL (ref 64–422)
Tissue Transglutaminase Ab, IgA: <2 U/mL (ref 0–3)

## 2016-06-24 LAB — LACTOFERRIN, FECAL, QUALITATIVE: Lactoferrin, Fecal, Qual: NEGATIVE

## 2016-06-24 NOTE — Telephone Encounter (Signed)
Patient left a voice message that she is returning your call °

## 2016-06-24 NOTE — Telephone Encounter (Signed)
LVM for patient callback for results per Dr. Vicente Males.  TSH normal

## 2016-06-24 NOTE — Telephone Encounter (Signed)
-----   Message from Jonathon Bellows, MD sent at 06/23/2016  8:32 AM EDT ----- TSH-normal

## 2016-06-26 LAB — H. PYLORI ANTIGEN, STOOL: H. Pylori Stool Ag, Eia: NEGATIVE

## 2016-07-04 ENCOUNTER — Other Ambulatory Visit: Payer: Self-pay | Admitting: Family Medicine

## 2016-07-05 ENCOUNTER — Ambulatory Visit (INDEPENDENT_AMBULATORY_CARE_PROVIDER_SITE_OTHER): Payer: Medicare HMO

## 2016-07-05 ENCOUNTER — Ambulatory Visit (INDEPENDENT_AMBULATORY_CARE_PROVIDER_SITE_OTHER): Payer: Medicare HMO | Admitting: Podiatry

## 2016-07-05 DIAGNOSIS — S92301D Fracture of unspecified metatarsal bone(s), right foot, subsequent encounter for fracture with routine healing: Secondary | ICD-10-CM

## 2016-07-05 NOTE — Telephone Encounter (Signed)
Scheduled 08/01/16

## 2016-07-06 NOTE — Progress Notes (Signed)
Subjective: 81 year old female presents today for follow-up evaluation of a fifth metatarsal fracture to the right foot. Patient states that approximately 5 weeks ago she fell in her bath/shower. Patient sustained a fifth metatarsal fracture to the right foot and has been managed in the past by Dr. Jacqualyn Posey. Patient states that she is doing much better. She reports wearing the compression stockings which seem to provide significant relief.  Objective: Minimal pain on palpation noted to the base of the fifth metatarsal right foot consistent with metatarsal fracture. Minimal edema noted.  Assessment: Fifth metatarsal fracture right foot- 80% healed  Plan of care: Today the patient was evaluated. Discontinue wearing CAM boot. Recommended good, supportive sneakers. Return to clinic in 8 weeks for follow up X-Rays.   Edrick Kins, DPM [facility]  Dr. Edrick Kins, [address]                                        [place],  [address]                Office [phone]  Fax [phone]

## 2016-07-19 ENCOUNTER — Encounter: Payer: Self-pay | Admitting: Gastroenterology

## 2016-07-19 ENCOUNTER — Ambulatory Visit (INDEPENDENT_AMBULATORY_CARE_PROVIDER_SITE_OTHER): Payer: Medicare HMO | Admitting: Gastroenterology

## 2016-07-19 VITALS — BP 126/69 | HR 63 | Temp 98.0°F | Ht 68.0 in | Wt 198.6 lb

## 2016-07-19 DIAGNOSIS — K30 Functional dyspepsia: Secondary | ICD-10-CM | POA: Diagnosis not present

## 2016-07-19 NOTE — Progress Notes (Signed)
Primary Care Physician: [name], MD  Primary Gastroenterologist:  Dr. Jonathon Bellows   No chief complaint on file.   HPI: [name] is a 81 y.o. female She is here today as a follow up to her initial appointment on 06/22/16 for loose stools, abdominal pain    Summary of history : She was seen by Dr Damita Dunnings on 04/2016 for diarrhea anda abdominal pain . LFT's ,CBC,BMP-normal . CT abdomen showed some diverticulosis of the left colon and small gall stones. She has had abdominal pain since 2015 , once a week lasting all day long , lower part of the abdomen , worse with foods , she had gained weight , was having 5-6 soft bowel movemets a day that was smelly. , lot of abdominal distension. Consumed sodas twice a week . Last colonoscopy was back in 2015 when she also had diarrhea and  Random colon biopsies taken were negative for microscopic colitis.   My differentials at that time were IBS-D vs small bowel bacterial overgrowth .    Interval history   06/22/2016-  07/19/2016    Stool for C diff, H pylori , lactoferrin was negative.Celiac serology and TSH were normal.   Trial of IB guard samples were provided and she says it has helped. She took it 3 times a day , while she was on it , didn't get it as often While off it gets pain on and off. .  Diarrhea is better. - now not every day .   Current Outpatient Prescriptions  Medication Sig Dispense Refill  . ALPRAZolam (XANAX) 0.5 MG tablet take 1 to 2 tablets by mouth three times a day if needed for anxiety 60 tablet 1  . apixaban (ELIQUIS) 5 MG TABS tablet Take 1 tablet (5 mg total) by mouth 2 (two) times daily. 60 tablet 0  . atorvastatin (LIPITOR) 80 MG tablet take 1 tablet by mouth once daily 90 tablet 1  . Cholecalciferol (VITAMIN D) 2000 units CAPS Take 4,000 Units by mouth daily.    . fluticasone (FLONASE) 50 MCG/ACT nasal spray Place 2 sprays into both nostrils daily. (Patient not taking: Reported on 06/22/2016) 16 g 6  . furosemide  (LASIX) 20 MG tablet Take 20 mg by mouth.    . isosorbide mononitrate (IMDUR) 60 MG 24 hr tablet Take 60 mg by mouth daily.    . lansoprazole (PREVACID) 15 MG capsule TAKE ONE CAPSULE BY MOUTH TWICE DAILY    . loperamide (IMODIUM A-D) 2 MG tablet Take 1 tablet (2 mg total) by mouth daily as needed for diarrhea or loose stools.    Marland Kitchen oxyCODONE-acetaminophen (PERCOCET/ROXICET) 5-325 MG tablet Take by mouth.    . promethazine (PHENERGAN) 25 MG tablet Take 25 mg by mouth.    . sertraline (ZOLOFT) 100 MG tablet Take 1.5 tablets (150 mg total) by mouth daily. 135 tablet 3  . TAZTIA XT 240 MG 24 hr capsule take 1 capsule by mouth once daily 90 capsule 0  . vitamin B-12 (CYANOCOBALAMIN) 1000 MCG tablet Take 1,000 mcg by mouth daily.     No current facility-administered medications for this visit.     Allergies as of 07/19/2016 - Review Complete 07/05/2016  Allergen Reaction Noted  . Amoxicillin Swelling 10/18/2011  . Penicillins Swelling and Other (See Comments) 07/11/2006  . Valium [diazepam] Other (See Comments) 10/03/2013    ROS:  General: Negative for anorexia, weight loss, fever, chills, fatigue, weakness. ENT: Negative for hoarseness, difficulty swallowing , nasal  congestion. CV: Negative for chest pain, angina, palpitations, dyspnea on exertion, peripheral edema.  Respiratory: Negative for dyspnea at rest, dyspnea on exertion, cough, sputum, wheezing.  GI: See history of present illness. GU:  Negative for dysuria, hematuria, urinary incontinence, urinary frequency, nocturnal urination.  Endo: Negative for unusual weight change.    Physical Examination:   There were no vitals taken for this visit.  General: Well-nourished, well-developed in no acute distress.  Eyes: No icterus. Conjunctivae pink. Mouth: Oropharyngeal mucosa moist and pink , no lesions erythema or exudate. Lungs: Clear to auscultation bilaterally. Non-labored. Heart: Regular rate and rhythm, no murmurs rubs or  gallops.  Abdomen: Bowel sounds are normal, nontender, nondistended, no hepatosplenomegaly or masses, no abdominal bruits or hernia , no rebound or guarding.   Extremities: No lower extremity edema. No clubbing or deformities. Neuro: Alert and oriented x 3.  Grossly intact. Skin: Warm and dry, no jaundice.   Psych: Alert and cooperative, normal mood and affect.    Imaging Studies: Dg Foot Complete Right  Result Date: 07/05/2016 Please see detailed radiograph report in office note.   Assessment and Plan:   [name] is a 81 y.o. y/o female here for follow up for abdominal pain , gas bloating and loose stools.She says the only medication that has helped her so far since she started having stomach issues is the IB guard.Very likely symptoms are due to functional dyspepsia.    1. Continue IB guard daily - provided more samples and discount coupons.  2. Fiber pills samples provided daily for loose stools    Dr Jonathon Bellows  MD Follow up in 2 months

## 2016-08-01 ENCOUNTER — Encounter: Payer: Medicare HMO | Admitting: Family Medicine

## 2016-08-01 ENCOUNTER — Ambulatory Visit: Payer: Medicare HMO

## 2016-08-10 DIAGNOSIS — R0789 Other chest pain: Secondary | ICD-10-CM | POA: Diagnosis not present

## 2016-08-10 DIAGNOSIS — I259 Chronic ischemic heart disease, unspecified: Secondary | ICD-10-CM | POA: Diagnosis not present

## 2016-08-10 DIAGNOSIS — E782 Mixed hyperlipidemia: Secondary | ICD-10-CM | POA: Diagnosis not present

## 2016-08-10 DIAGNOSIS — I480 Paroxysmal atrial fibrillation: Secondary | ICD-10-CM | POA: Diagnosis not present

## 2016-08-10 DIAGNOSIS — I10 Essential (primary) hypertension: Secondary | ICD-10-CM | POA: Diagnosis not present

## 2016-08-25 ENCOUNTER — Other Ambulatory Visit: Payer: Self-pay | Admitting: Family Medicine

## 2016-08-28 ENCOUNTER — Other Ambulatory Visit: Payer: Self-pay | Admitting: Family Medicine

## 2016-08-29 NOTE — Telephone Encounter (Signed)
Electronic refill request. Alprazolam Last office visit:   05/09/16 Last Filled:    60 tablet 1 06/17/2016  Please advise.

## 2016-08-30 NOTE — Telephone Encounter (Signed)
Please call in.  Thanks.   

## 2016-08-30 NOTE — Telephone Encounter (Signed)
Medication phoned to pharmacy.  

## 2016-09-01 ENCOUNTER — Ambulatory Visit (INDEPENDENT_AMBULATORY_CARE_PROVIDER_SITE_OTHER): Payer: Medicare HMO | Admitting: Family Medicine

## 2016-09-01 ENCOUNTER — Encounter: Payer: Self-pay | Admitting: Family Medicine

## 2016-09-01 VITALS — BP 138/70 | HR 88 | Temp 98.6°F | Wt 186.0 lb

## 2016-09-01 DIAGNOSIS — F341 Dysthymic disorder: Secondary | ICD-10-CM | POA: Diagnosis not present

## 2016-09-01 DIAGNOSIS — I10 Essential (primary) hypertension: Secondary | ICD-10-CM | POA: Diagnosis not present

## 2016-09-01 DIAGNOSIS — M810 Age-related osteoporosis without current pathological fracture: Secondary | ICD-10-CM | POA: Diagnosis not present

## 2016-09-01 DIAGNOSIS — R109 Unspecified abdominal pain: Secondary | ICD-10-CM | POA: Diagnosis not present

## 2016-09-01 DIAGNOSIS — R69 Illness, unspecified: Secondary | ICD-10-CM | POA: Diagnosis not present

## 2016-09-01 LAB — COMPREHENSIVE METABOLIC PANEL
ALK PHOS: 93 U/L (ref 39–117)
ALT: 20 U/L (ref 0–35)
AST: 35 U/L (ref 0–37)
Albumin: 4.1 g/dL (ref 3.5–5.2)
BILIRUBIN TOTAL: 0.5 mg/dL (ref 0.2–1.2)
BUN: 17 mg/dL (ref 6–23)
CALCIUM: 9.6 mg/dL (ref 8.4–10.5)
CO2: 22 mEq/L (ref 19–32)
Chloride: 98 mEq/L (ref 96–112)
Creatinine, Ser: 0.93 mg/dL (ref 0.40–1.20)
GFR: 74.86 mL/min (ref >60.00)
Glucose, Bld: 66 mg/dL — ABNORMAL LOW (ref 70–99)
Potassium: 4.3 mEq/L (ref 3.5–5.1)
SODIUM: 135 meq/L (ref 135–145)
Total Protein: 7.6 g/dL (ref 6.0–8.3)

## 2016-09-01 LAB — LIPID PANEL
CHOLESTEROL: 181 mg/dL (ref 0–200)
HDL: 53.8 mg/dL (ref >39.00)
LDL Cholesterol: 112 mg/dL — ABNORMAL HIGH (ref 0–99)
NonHDL: 127.66
Total CHOL/HDL Ratio: 3
Triglycerides: 79 mg/dL (ref 0.0–149.0)
VLDL: 15.8 mg/dL (ref 0.0–40.0)

## 2016-09-01 LAB — CBC WITH DIFFERENTIAL/PLATELET
BASOS PCT: 0.5 % (ref 0.0–3.0)
Basophils Absolute: 0 10*3/uL (ref 0.0–0.1)
EOS ABS: 0 10*3/uL (ref 0.0–0.7)
Eosinophils Relative: 0.2 % (ref 0.0–5.0)
HCT: 41.1 % (ref 36.0–46.0)
Hemoglobin: 13.6 g/dL (ref 12.0–15.0)
Lymphocytes Relative: 25.3 % (ref 12.0–46.0)
Lymphs Abs: 2.3 10*3/uL (ref 0.7–4.0)
MCHC: 33.1 g/dL (ref 30.0–36.0)
MCV: 86.9 fl (ref 78.0–100.0)
MONO ABS: 0.5 10*3/uL (ref 0.1–1.0)
Monocytes Relative: 5.3 % (ref 3.0–12.0)
NEUTROS ABS: 6.3 10*3/uL (ref 1.4–7.7)
Neutrophils Relative %: 68.7 % (ref 43.0–77.0)
PLATELETS: 287 10*3/uL (ref 150.0–400.0)
RBC: 4.73 Mil/uL (ref 3.87–5.11)
RDW: 14.5 % (ref 11.5–15.5)
WBC: 9.2 10*3/uL (ref 4.0–10.5)

## 2016-09-01 LAB — VITAMIN D 25 HYDROXY (VIT D DEFICIENCY, FRACTURES): VITD: 42.92 ng/mL (ref 30.00–100.00)

## 2016-09-01 LAB — LIPASE: LIPASE: 38 U/L (ref 11.0–59.0)

## 2016-09-01 NOTE — Patient Instructions (Signed)
Go to the lab on the way out.  We'll contact you with your lab report. We'll go from there.  Take care.  Glad to see you.  
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
## 2016-09-02 ENCOUNTER — Telehealth: Payer: Self-pay

## 2016-09-02 NOTE — Assessment & Plan Note (Signed)
Unclear source, benign exam, not an acute abdomen. Discussed with patient. She has seen GI previously. It may be that she had a viral illness recently. She is some better in the meantime now. Pepto-Bismol is helped some. Reasonable to continue as is. Check routine labs today. See notes on labs. >25 minutes spent in face to face time with patient, >50% spent in counselling or coordination of care.

## 2016-09-02 NOTE — Assessment & Plan Note (Signed)
Reasonable to continue SSRI. She is trying to work through her situation. No suicidal or homicidal intent.

## 2016-09-02 NOTE — Telephone Encounter (Signed)
Caller left vm with no name asking to discuss the pt's lab results. Called and it was [name]. POA

## 2016-09-06 ENCOUNTER — Encounter: Payer: Self-pay | Admitting: *Deleted

## 2016-09-06 ENCOUNTER — Encounter: Payer: Medicare HMO | Admitting: Podiatry

## 2016-09-08 ENCOUNTER — Encounter: Payer: Self-pay | Admitting: Family Medicine

## 2016-09-08 ENCOUNTER — Ambulatory Visit (INDEPENDENT_AMBULATORY_CARE_PROVIDER_SITE_OTHER): Payer: Medicare HMO | Admitting: Family Medicine

## 2016-09-08 ENCOUNTER — Ambulatory Visit (INDEPENDENT_AMBULATORY_CARE_PROVIDER_SITE_OTHER): Payer: Medicare HMO

## 2016-09-08 VITALS — BP 140/90 | HR 90 | Temp 97.9°F | Ht 65.5 in | Wt 176.5 lb

## 2016-09-08 DIAGNOSIS — Z7189 Other specified counseling: Secondary | ICD-10-CM

## 2016-09-08 DIAGNOSIS — F341 Dysthymic disorder: Secondary | ICD-10-CM | POA: Diagnosis not present

## 2016-09-08 DIAGNOSIS — M810 Age-related osteoporosis without current pathological fracture: Secondary | ICD-10-CM

## 2016-09-08 DIAGNOSIS — E785 Hyperlipidemia, unspecified: Secondary | ICD-10-CM

## 2016-09-08 DIAGNOSIS — Z0000 Encounter for general adult medical examination without abnormal findings: Secondary | ICD-10-CM | POA: Diagnosis not present

## 2016-09-08 DIAGNOSIS — K219 Gastro-esophageal reflux disease without esophagitis: Secondary | ICD-10-CM | POA: Diagnosis not present

## 2016-09-08 DIAGNOSIS — R69 Illness, unspecified: Secondary | ICD-10-CM | POA: Diagnosis not present

## 2016-09-08 MED ORDER — SERTRALINE HCL 100 MG PO TABS: 100.0000 mg | ORAL_TABLET | Freq: Every day | ORAL | 3 refills | Status: DC

## 2016-09-08 MED ORDER — LANSOPRAZOLE 15 MG PO CPDR: 15.0000 mg | DELAYED_RELEASE_CAPSULE | Freq: Every day | ORAL | 3 refills | Status: DC

## 2016-09-08 NOTE — Progress Notes (Signed)
Pre visit review using our clinic review tool, if applicable. No additional management support is needed unless otherwise documented below in the visit note. 
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
## 2016-09-08 NOTE — Progress Notes (Signed)
PCP notes:   Health maintenance:  No gaps identified.   Abnormal screenings:   Hearing - failed Fall risk - hx of multiple falls with and without injury Depression score: 8 MIni-Cog score: 19/20  Patient concerns:   Pt states she feels nausea. Pt denies vomiting or diarrhea. Pt reports decreased appetite and a bad taste in her mouth. PCP notified.  Nurse concerns:  None  Next PCP appt:   09/08/16 @ 1215  I reviewed health advisor's note, was available for consultation on the day of service listed in this note, and agree with documentation and plan. [name], MD.

## 2016-09-08 NOTE — Patient Instructions (Signed)
Take 2000 units of vitamin D a day.  Add back prevacid, 1-2 times a day.  Use a wedge pillow or prop up the head of your bed.  Cut back to 1 tab of sertraline and update me as needed.  You may need to go back to 1.5 tabs in a day.  Take care.  Glad to see you.

## 2016-09-08 NOTE — Progress Notes (Signed)
GERD sx with breath odor changes.  Off PPI. No other triggers noted. No more diarrhea in the meantime.  Her previous aches are resolved and she clearly feels better in the meantime.  Advanced directive discussed with patient. Daughter Otila Kluver designated if patient were incapacitated.   Colonoscopy deferred given the anticoagulation and age, d/w pt.    Shingles d/w pt.  she can consider.  Osteoporosis, defer tx for now with recently GI upset and GERD but vit D wnl.  Labs discussed with patient.  Elevated Cholesterol: Using medications without problems: yes Muscle aches: no Diet compliance: d/w pt.  Exercise: limited  Mood discussed with patient. She's trying to work through her situation. She wanted to try to back down on her sertraline, down to 100 mg a day. She previously skipped a dose recently and felt better. She thought the medication had been beneficial overall but wanted to try the lower dose.  She feels that she is functional as is.  PMH and SH reviewed  ROS: Per HPI unless specifically indicated in ROS section   Meds, vitals, and allergies reviewed.   GEN: nad, alert and oriented, her affect is brighter today compared to previous. She looks like she feels better in general. HEENT: mucous membranes moist NECK: supple w/o LA CV: rrr.  no murmur PULM: ctab, no inc wob ABD: soft, +bs EXT: no edema SKIN: no acute rash

## 2016-09-08 NOTE — Patient Instructions (Signed)
[name] , Thank you for taking time to come for your Medicare Wellness Visit. I appreciate your ongoing commitment to your health goals. Please review the following plan we discussed and let me know if I can assist you in the future.   These are the goals we discussed: Goals    . safety          Starting 09/08/16, I will continue to use assistive devices as needed to reduce risk of falls.        This is a list of the screening recommended for you and due dates:  Health Maintenance  Topic Date Due  . Flu Shot  10/12/2016  . Tetanus Vaccine  09/24/2018  . DEXA scan (bone density measurement)  Completed  . Pneumonia vaccines  Completed   Preventive Care for Adults  A healthy lifestyle and preventive care can promote health and wellness. Preventive health guidelines for adults include the following key practices.  . A routine yearly physical is a good way to check with your health care provider about your health and preventive screening. It is a chance to share any concerns and updates on your health and to receive a thorough exam.  . Visit your dentist for a routine exam and preventive care every 6 months. Brush your teeth twice a day and floss once a day. Good oral hygiene prevents tooth decay and gum disease.  . The frequency of eye exams is based on your age, health, family medical history, use  of contact lenses, and other factors. Follow your health care provider's ecommendations for frequency of eye exams.  . Eat a healthy diet. Foods like vegetables, fruits, whole grains, low-fat dairy products, and lean protein foods contain the nutrients you need without too many calories. Decrease your intake of foods high in solid fats, added sugars, and salt. Eat the right amount of calories for you. Get information about a proper diet from your health care provider, if necessary.  . Regular physical exercise is one of the most important things you can do for your health. Most adults  should get at least 150 minutes of moderate-intensity exercise (any activity that increases your heart rate and causes you to sweat) each week. In addition, most adults need muscle-strengthening exercises on 2 or more days a week.  Silver Sneakers may be a benefit available to you. To determine eligibility, you may visit the website: www.silversneakers.com or contact program at (848)215-8773 Mon-Fri between 8AM-8PM.   . Maintain a healthy weight. The body mass index (BMI) is a screening tool to identify possible weight problems. It provides an estimate of body fat based on height and weight. Your health care provider can find your BMI and can help you achieve or maintain a healthy weight.   For adults 20 years and older: ? A BMI below 18.5 is considered underweight. ? A BMI of 18.5 to 24.9 is normal. ? A BMI of 25 to 29.9 is considered overweight. ? A BMI of 30 and above is considered obese.   . Maintain normal blood lipids and cholesterol levels by exercising and minimizing your intake of saturated fat. Eat a balanced diet with plenty of fruit and vegetables. Blood tests for lipids and cholesterol should begin at age 60 and be repeated every 5 years. If your lipid or cholesterol levels are high, you are over 50, or you are at high risk for heart disease, you may need your cholesterol levels checked more frequently. Ongoing high lipid and cholesterol  levels should be treated with medicines if diet and exercise are not working.  . If you smoke, find out from your health care provider how to quit. If you do not use tobacco, please do not start.  . If you choose to drink alcohol, please do not consume more than 2 drinks per day. One drink is considered to be 12 ounces (355 mL) of beer, 5 ounces (148 mL) of wine, or 1.5 ounces (44 mL) of liquor.  . If you are 13-22 years old, ask your health care provider if you should take aspirin to prevent strokes.  . Use sunscreen. Apply sunscreen liberally and  repeatedly throughout the day. You should seek shade when your shadow is shorter than you. Protect yourself by wearing long sleeves, pants, a wide-brimmed hat, and sunglasses year round, whenever you are outdoors.  . Once a month, do a whole body skin exam, using a mirror to look at the skin on your back. Tell your health care provider of new moles, moles that have irregular borders, moles that are larger than a pencil eraser, or moles that have changed in shape or color.

## 2016-09-09 DIAGNOSIS — Z7189 Other specified counseling: Secondary | ICD-10-CM | POA: Insufficient documentation

## 2016-09-09 NOTE — Assessment & Plan Note (Addendum)
Mood discussed with patient. She's trying to work through her situation. She wanted to try to back down on her sertraline, down to 100 mg a day. She previously skipped a dose recently and felt better. She thought the medication had been beneficial overall but wanted to try the lower dose. >25 minutes spent in face to face time with patient, >50% spent in counselling or coordination of care.

## 2016-09-09 NOTE — Assessment & Plan Note (Signed)
defer tx for now with recently GI upset and GERD but vit D wnl.  Labs discussed with patient.

## 2016-09-09 NOTE — Assessment & Plan Note (Signed)
Reasonable to continue statin. She agrees. Labs discussed with patient. Continue healthy diet. Exercise is limited.

## 2016-09-09 NOTE — Assessment & Plan Note (Signed)
Restart Prevacid. Likely causing the rash/odor changes.  The previous abdominal symptoms have resolved it is likely that she has a resolved gastroenteritis. Discussed with patient. She agrees.

## 2016-09-09 NOTE — Assessment & Plan Note (Signed)
Advanced directive discussed with patient. Daughter Otila Kluver designated if patient were incapacitated.

## 2016-09-14 ENCOUNTER — Emergency Department
Admission: EM | Admit: 2016-09-14 | Discharge: 2016-09-14 | Disposition: A | Discharge status: Home / Self Care | Payer: Medicare HMO | Attending: Emergency Medicine | Admitting: Emergency Medicine

## 2016-09-14 ENCOUNTER — Emergency Department: Payer: Medicare HMO

## 2016-09-14 DIAGNOSIS — Z7901 Long term (current) use of anticoagulants: Secondary | ICD-10-CM | POA: Diagnosis not present

## 2016-09-14 DIAGNOSIS — M545 Low back pain: Secondary | ICD-10-CM | POA: Diagnosis not present

## 2016-09-14 DIAGNOSIS — I10 Essential (primary) hypertension: Secondary | ICD-10-CM | POA: Insufficient documentation

## 2016-09-14 DIAGNOSIS — Z79899 Other long term (current) drug therapy: Secondary | ICD-10-CM | POA: Diagnosis not present

## 2016-09-14 DIAGNOSIS — M549 Dorsalgia, unspecified: Secondary | ICD-10-CM

## 2016-09-14 DIAGNOSIS — R42 Dizziness and giddiness: Secondary | ICD-10-CM | POA: Diagnosis not present

## 2016-09-14 DIAGNOSIS — M546 Pain in thoracic spine: Secondary | ICD-10-CM | POA: Diagnosis not present

## 2016-09-14 LAB — BASIC METABOLIC PANEL
Anion gap: 8 (ref 5–15)
BUN: 12 mg/dL (ref 6–20)
CHLORIDE: 101 mmol/L (ref 101–111)
CO2: 29 mmol/L (ref 22–32)
Calcium: 9 mg/dL (ref 8.9–10.3)
Creatinine, Ser: 0.74 mg/dL (ref 0.44–1.00)
Glucose, Bld: 104 mg/dL — ABNORMAL HIGH (ref 65–99)
POTASSIUM: 3.4 mmol/L — AB (ref 3.5–5.1)
SODIUM: 138 mmol/L (ref 135–145)

## 2016-09-14 LAB — CBC WITH DIFFERENTIAL/PLATELET
BASOS ABS: 0 10*3/uL (ref 0–0.1)
BASOS PCT: 0 %
EOS ABS: 0.1 10*3/uL (ref 0–0.7)
EOS PCT: 1 %
HCT: 39.6 % (ref 35.0–47.0)
HEMOGLOBIN: 13.5 g/dL (ref 12.0–16.0)
Lymphocytes Relative: 30 %
Lymphs Abs: 2.5 10*3/uL (ref 1.0–3.6)
MCH: 29.1 pg (ref 26.0–34.0)
MCHC: 34.2 g/dL (ref 32.0–36.0)
MCV: 85.1 fL (ref 80.0–100.0)
Monocytes Absolute: 0.7 10*3/uL (ref 0.2–0.9)
Monocytes Relative: 9 %
NEUTROS PCT: 60 %
Neutro Abs: 5.1 10*3/uL (ref 1.4–6.5)
PLATELETS: 211 10*3/uL (ref 150–440)
RBC: 4.65 MIL/uL (ref 3.80–5.20)
RDW: 15 % — ABNORMAL HIGH (ref 11.5–14.5)
WBC: 8.4 10*3/uL (ref 3.6–11.0)

## 2016-09-14 LAB — URINALYSIS, COMPLETE (UACMP) WITH MICROSCOPIC
Bacteria, UA: NONE SEEN
Bilirubin Urine: NEGATIVE
GLUCOSE, UA: NEGATIVE mg/dL
Hgb urine dipstick: NEGATIVE
KETONES UR: NEGATIVE mg/dL
NITRITE: NEGATIVE
PH: 6 (ref 5.0–8.0)
PROTEIN: NEGATIVE mg/dL
Specific Gravity, Urine: 1.009 (ref 1.005–1.030)

## 2016-09-14 LAB — TROPONIN I: Troponin I: <0.03 ng/mL (ref <0.03)

## 2016-09-14 MED ORDER — ACETAMINOPHEN 500 MG PO TABS
1000.0000 mg | ORAL_TABLET | Freq: Once | ORAL | Status: AC
  Administered 2016-09-14: 1000 mg via ORAL
  Filled 2016-09-14: qty 2

## 2016-09-14 MED ORDER — TRAMADOL HCL 50 MG PO TABS: 50.0000 mg | ORAL_TABLET | Freq: Four times a day (QID) | ORAL | 0 refills | Status: DC | PRN

## 2016-09-14 NOTE — Discharge Instructions (Signed)
Pain control: Take tylenol 1000mg every 8 hours. Take 50mg of tramadol every 6 hours for breakthrough pain. If you need the tramadol make sure to take one senokot as well to prevent constipation. ° °Do not drink alcohol, drive or participate in any other potentially dangerous activities while taking this medication as it may make you sleepy. Do not take this medication with any other sedating medications, either prescription or over-the-counter. ° °
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
## 2016-09-14 NOTE — ED Notes (Signed)
Patient transported to X-ray 

## 2016-09-14 NOTE — ED Triage Notes (Signed)
Pt c/o lower back pain and dizziness/near syncope since falling down at church about 3-4 weeks ago. Denies pain on arrival states it only bothers her when she gets up to walk.

## 2016-09-14 NOTE — ED Notes (Signed)

## 2016-09-15 ENCOUNTER — Other Ambulatory Visit: Payer: Self-pay | Admitting: Family Medicine

## 2016-09-15 ENCOUNTER — Telehealth: Payer: Self-pay

## 2016-09-15 MED ORDER — FLUTICASONE PROPIONATE 50 MCG/ACT NA SUSP: 2.0000 | Freq: Every day | NASAL | 6 refills | Status: DC

## 2016-09-15 NOTE — Telephone Encounter (Signed)
Per chart review tab pt was seen [facility] ED on 09/14/16.

## 2016-09-15 NOTE — Telephone Encounter (Signed)
Pt request refill [facility] to [facility] [address]. Last annual 09/08/16. Refilled per protocol. Pt advised understanding.

## 2016-09-15 NOTE — Telephone Encounter (Signed)
PLEASE NOTE: All timestamps contained within this report are represented as [place] Standard Time. CONFIDENTIALTY NOTICE: This fax transmission is intended only for the addressee. It contains information that is legally privileged, confidential or otherwise protected from use or disclosure. If you are not the intended recipient, you are strictly prohibited from reviewing, disclosing, copying using or disseminating any of this information or taking any action in reliance on or regarding this information. If you have received this fax in error, please notify us immediately by telephone so that we can arrange for its return to [place]. Phone: [phone], Toll-Free: [phone], Fax: [phone] Page: 1 of 2 Call Id: [id number] [place] Patient Name: [name] Gender: Female DOB: 13-Jul-1935 Age: 81 Y 106 M 8 D Return Phone Number: [phone] (Primary) City/State/Zip: [place] [address] Client [place] [facility] - Client Client Site [facility] Physician Renford Dills - MD Who Is Calling Patient / Member / Family / Caregiver Call Type Triage / Clinical Relationship To Patient Self Return Phone Number [phone] (Primary) Chief Complaint Back Injury Reason for Call Symptomatic / Request for [name] is having severe pain from her pelvic bone to her neck. She fell 3 weeks ago. She was walking without her walker today and that is when she noticed the pain was worse. Nurse Assessment Nurse: Leeanne Rio, RN, Ann Date/Time (Eastern Time): 09/14/2016 3:02:57 PM Confirm and document reason for call. If symptomatic, describe symptoms. ---Caller is having severe pain from her pelvic bone to her neck. She fell 3 weeks ago. She was walking without her walker today and that is when she noticed the pain was worse. Caller  states the walker is dangerous. She hasn't fallen since the first fall. Caller states she has been taking her medication as prescribed. She states part of her body wants to go forward and the other part wants to go another way. Does the PT have any chronic conditions? (i.e. diabetes, asthma, etc.) ---Yes List chronic conditions. ---HTN, Depression, Guidelines Guideline Title Affirmed Question Back Pain Unable to walk Disp. Time Eilene Ghazi Time) Disposition Final User 09/14/2016 3:26:49 PM Go to ED Now (or PCP triage) Yes [place], RN, [facility] - ED Care Advice Given Per Guideline GO TO ED NOW (OR PCP TRIAGE): * IF NO PCP TRIAGE: You need to be seen. Go to the [facility] at [facility] within the next hour. Leave as soon as you can. DRIVING: Another adult should drive. AMBULANCE TRANSPORT: If the patient cannot walk at all because of the severity of the back pain or because of significant leg weakness, then the patient will need to be transported via ambulance and examined at the emergency department. The patient or family members can arrange ambulance transport via private ambulance company or via EMS 911. CARE ADVICE given per Back Pain (Adult) guideline. PLEASE NOTE: All timestamps contained within this report are represented as [place] Standard Time. CONFIDENTIALTY NOTICE: This fax transmission is intended only for the addressee. It contains information that is legally privileged, confidential or otherwise protected from use or disclosure. If you are not the intended recipient, you are strictly prohibited from reviewing, disclosing, copying using or disseminating any of this information or taking any action in reliance on or regarding this information. If you have received this fax in error, please notify us immediately by telephone so that we can  arrange for its return to [place]. Phone: [phone], Toll-Free: [phone], Fax:  [phone] Page: 2 of 2 Call Id: [id number] Comments User: Vanetta Shawl, RN Date/Time Eilene Ghazi Time): 09/14/2016 3:07:54 PM RN called number provided [phone] and housekeeping answered phone, and stated that "this person was no longer in this room" User: Vanetta Shawl, RN Date/Time Eilene Ghazi Time): 09/14/2016 3:09:16 PM Tariah is calling back because she had given the wrong number. User: Vanetta Shawl, RN Date/Time Eilene Ghazi Time): 09/14/2016 3:16:44 PM Caller states when she walks with walker it does not hurt her, however without the walker it is painful. However, she is unable to control the walker. She states she cannot put the breaks on, it is to hard it wants to roll out from under her. Caller states she lives along and are gone for the holiday. User: Vanetta Shawl, RN Date/Time Eilene Ghazi Time): 09/14/2016 3:18:32 PM caller states she is not dizzy. User: Vanetta Shawl, RN Date/Time Eilene Ghazi Time): 09/14/2016 3:23:31 PM Caller states she is not good with working her phone, so RN does not suggest Telehealth.

## 2016-09-16 LAB — URINE CULTURE: Culture: NO GROWTH

## 2016-09-19 ENCOUNTER — Encounter: Payer: Self-pay | Admitting: Family Medicine

## 2016-09-19 ENCOUNTER — Ambulatory Visit (INDEPENDENT_AMBULATORY_CARE_PROVIDER_SITE_OTHER): Payer: Medicare HMO | Admitting: Family Medicine

## 2016-09-19 ENCOUNTER — Ambulatory Visit: Payer: Medicare HMO | Admitting: Gastroenterology

## 2016-09-19 VITALS — BP 150/70 | HR 75 | Temp 97.9°F | Wt 182.8 lb

## 2016-09-19 DIAGNOSIS — Z7189 Other specified counseling: Secondary | ICD-10-CM

## 2016-09-19 DIAGNOSIS — M545 Low back pain: Secondary | ICD-10-CM | POA: Diagnosis not present

## 2016-09-19 MED ORDER — TRAMADOL HCL 50 MG PO TABS: 50.0000 mg | ORAL_TABLET | Freq: Four times a day (QID) | ORAL | 0 refills | Status: DC | PRN

## 2016-09-19 NOTE — Progress Notes (Signed)
Advanced directive discussed with patient.  She doesn't want [name] to make any decisions re: her care.  She doesn't want [name] to have any access to her information, as a designated party release.  She doesn't want [name] to have any access or input re: her care and doesn't want [name] to be her medical or legal POA.   D/w pt. Pt is alert and oriented and appears completely able to make her own decisions, so it is reasonable and correct to follow her wishes.   Daughter [name] designated if patient were incapacitated.   Grandson [name] if daughter [name] is incapacitated.    Patient personally wrote a will.  She signed it in front of me and her daughter [name]. I made a copy to be scanned.  I gave the original back to the patient.  I did not make any marks on the original.  I did initial the photocopy per routine to have it sent for scanning.  ER f/u d/w pt.  Back pain.  Ucx neg.  D/w pt about walker use.   Now she has a walker w/o wheels.  D/w pt about setting up PT.  She isn't going barefoot.  Her pain is better today.  Not pain sitting down. More pain walking.  Ucx neg.  XR note with: 1. No acute osseous finding. 2. Disc and facet degeneration with mild L4-5 anterolisthesis.   She wasn't able to get her tramadol filled.  rx printed at [facility] for patient, given to patient.   Meds, vitals, and allergies reviewed.   ROS: Per HPI unless specifically indicated in ROS section   GEN: nad, alert and oriented HEENT: mucous membranes moist NECK: supple w/o LA CV: rrr PULM: ctab, no inc wob ABD: soft, +bs EXT: no edema Back not ttp in midline but lower paraspinal muscles ttp B Able to bear weight, walking with walker.

## 2016-09-19 NOTE — Progress Notes (Signed)
This encounter was created in error - please disregard.

## 2016-09-19 NOTE — Patient Instructions (Addendum)
Please help patient with DPR on the way out.   She doesn't want Otila Kluver to make any decisions re: her care.  She doesn't want Otila Kluver to have any access to her information, as a designated party release.  She doesn't want Otila Kluver to have any access or input re: her care.  Please remove Otila Kluver from the contact information, etc.    Please list daughter Hoyle Sauer as emergency contact/DPR/POA.    Use the walker, take tramadol if needed for pain.  We'll work on setting up physical therapy at home.  Take care.  Glad to see you.

## 2016-09-19 NOTE — Assessment & Plan Note (Signed)
Okay to use tramadol. Discussed with patient about previous imaging. Reasonable to set up home health PT. ER course discussed with patient. >25 minutes spent in face to face time with patient, >50% spent in counselling or coordination of care.

## 2016-09-19 NOTE — Assessment & Plan Note (Signed)
Advanced directive discussed with patient.   Daughter Hoyle Sauer designated if patient were incapacitated.   Grandson [employer] if daughter Hoyle Sauer is incapacitated.   She doesn't want Otila Kluver to make any decisions or have any input re: her care.

## 2016-09-20 ENCOUNTER — Telehealth: Payer: Self-pay

## 2016-09-20 NOTE — Telephone Encounter (Signed)
Pt left v/m requesting cb about medication. Tried to contact pt x 3 but line remains busy.

## 2016-09-20 NOTE — Telephone Encounter (Signed)
Pt said was seen in ED and was given pain pill tramadol which was picked up at [facility] [address]. Pt does not know who picked up med and now pt cannot get the tramadol Dr Damita Dunnings sent in on 09/19/16. Pt said she needs pain med.Audelia Acton at [address] said [place] car with sunroof picked up med. Not sure if pts daughter picked up rx. Pt gave me permission to call Miguel Dibble at [phone] to see if Otila Kluver picked up rx. Otila Kluver said she picked up the Tramadol rx on 09/19/16. I thanked her and hung up. I advised pt that Otila Kluver picked up tramadol and Mrs Russi said she is not going to give it to her or she would have brought that when she brought the pt the nasal spray. I asked Dr Damita Dunnings and he asked for [facility] to fill the rx written for tramadol on 09/19/16. Shane at [facility] said would fill # 20 tramadol but ins will not pay;Audelia Acton used a discount card and pt would need to pay $6.45. Also Audelia Acton will need to know who is going to pick up med and that person will need photo ID. Mrs Davidoff said the cost was OK and Ambera Fedele, pts daughter will pick up med and she will have photo ID> It was also noted to Audelia Acton that nothing else is to be picked up by Miguel Dibble. Audelia Acton noted this on pts acct. Mrs Ryce said to apologize to Dr Damita Dunnings for all this trouble but pt was appreciative. FYI to Dr Damita Dunnings.

## 2016-09-20 NOTE — Telephone Encounter (Signed)
Agree, noted, thanks.

## 2016-09-23 DIAGNOSIS — Z7901 Long term (current) use of anticoagulants: Secondary | ICD-10-CM | POA: Diagnosis not present

## 2016-09-23 DIAGNOSIS — M5136 Other intervertebral disc degeneration, lumbar region: Secondary | ICD-10-CM | POA: Diagnosis not present

## 2016-09-23 DIAGNOSIS — R69 Illness, unspecified: Secondary | ICD-10-CM | POA: Diagnosis not present

## 2016-09-23 DIAGNOSIS — M5430 Sciatica, unspecified side: Secondary | ICD-10-CM | POA: Diagnosis not present

## 2016-09-23 DIAGNOSIS — E559 Vitamin D deficiency, unspecified: Secondary | ICD-10-CM | POA: Diagnosis not present

## 2016-09-23 DIAGNOSIS — M545 Low back pain: Secondary | ICD-10-CM | POA: Diagnosis not present

## 2016-09-28 ENCOUNTER — Telehealth: Payer: Self-pay | Admitting: Family Medicine

## 2016-09-28 DIAGNOSIS — Z7901 Long term (current) use of anticoagulants: Secondary | ICD-10-CM | POA: Diagnosis not present

## 2016-09-28 DIAGNOSIS — E559 Vitamin D deficiency, unspecified: Secondary | ICD-10-CM | POA: Diagnosis not present

## 2016-09-28 DIAGNOSIS — M545 Low back pain: Secondary | ICD-10-CM | POA: Diagnosis not present

## 2016-09-28 DIAGNOSIS — M5430 Sciatica, unspecified side: Secondary | ICD-10-CM | POA: Diagnosis not present

## 2016-09-28 DIAGNOSIS — R69 Illness, unspecified: Secondary | ICD-10-CM | POA: Diagnosis not present

## 2016-09-28 DIAGNOSIS — M5136 Other intervertebral disc degeneration, lumbar region: Secondary | ICD-10-CM | POA: Diagnosis not present

## 2016-09-28 NOTE — Telephone Encounter (Signed)
Please get update on patient later today. The black stool could've been related to previous Pepto-Bismol use but if she is having severe abdominal pain and I agree with emergency room evaluation. Thanks.

## 2016-09-28 NOTE — Telephone Encounter (Signed)
Patient's daughter Hoyle Sauer on [place]) notified as instructed by telephone. Hoyle Sauer stated that her mom had physical therapy today and did fine without any complaints. Hoyle Sauer stated that her mom is taking a nap now, but will take her to the ER if she wakes up and is having any further problems,

## 2016-09-28 NOTE — Telephone Encounter (Signed)
Patient Name: [name] DOB: Aug 11, 1935 Initial Comment Caller states she has black stool. Nurse Assessment Nurse: Vallery Sa, RN, Cathy Date/Time (Eastern Time): 09/28/2016 9:55:37 AM Confirm and document reason for call. If symptomatic, describe symptoms. ---Darryll Capers states she had a black stool about a hour ago. She has been taking Pepto Bismal for her upset stomach for the past 2 weeks. She developed lower, bilateral abdominal pain last night (pain rated as a 9 on the 1 to 10 scale). Alert and responsive. Does the patient have any new or worsening symptoms? ---Yes Will a triage be completed? ---Yes Related visit to physician within the last 2 weeks? ---No Does the PT have any chronic conditions? (i.e. diabetes, asthma, etc.) ---Yes List chronic conditions. ---Cardiac Stents placed about 15 years ago, High Cholesterol Is this a behavioral health or substance abuse call? ---No Guidelines Guideline Title Affirmed Question Affirmed Notes Abdominal Pain - Female [1] SEVERE pain AND [2] age > 58 Final Disposition User Go to ED Now Vallery Sa, RN, [place] Comments Her daughter, Hoyle Sauer, will take her to the ER now. Referrals [facility] - ED Disagree/Comply: Comply

## 2016-09-28 NOTE — Telephone Encounter (Signed)
Noted. Thanks.

## 2016-09-28 NOTE — Telephone Encounter (Signed)
PLEASE NOTE: All timestamps contained within this report are represented as [place] Standard Time. CONFIDENTIALTY NOTICE: This fax transmission is intended only for the addressee. It contains information that is legally privileged, confidential or otherwise protected from use or disclosure. If you are not the intended recipient, you are strictly prohibited from reviewing, disclosing, copying using or disseminating any of this information or taking any action in reliance on or regarding this information. If you have received this fax in error, please notify us immediately by telephone so that we can arrange for its return to [place]. Phone: [phone], Toll-Free: [phone], Fax: [phone] Page: 1 of 1 Call Id: [id number] [place] Day - Client Nonclinical Telephone Record [place] Day - Client Client Site [place] - Day Physician AA - PHYSICIAN, Verita Schneiders- MD Contact Type Call Who Is Calling Patient / Member / Family / Caregiver Caller Name [place] Phone Number n/a Patient Name [name] Call Type Message Only Information Provided Reason for Call Request for General Office Information Initial Comment Caller states she is not taking her mother to the ER. She states that she will take her when she wakes up if she is still feeling stomach pain Additional Comment Call Closed By: Bethann Berkshire Transaction Date/Time: 09/28/2016 10:31:52 AM (ET)

## 2016-09-30 DIAGNOSIS — Z7901 Long term (current) use of anticoagulants: Secondary | ICD-10-CM | POA: Diagnosis not present

## 2016-09-30 DIAGNOSIS — E559 Vitamin D deficiency, unspecified: Secondary | ICD-10-CM | POA: Diagnosis not present

## 2016-09-30 DIAGNOSIS — R69 Illness, unspecified: Secondary | ICD-10-CM | POA: Diagnosis not present

## 2016-09-30 DIAGNOSIS — M5430 Sciatica, unspecified side: Secondary | ICD-10-CM | POA: Diagnosis not present

## 2016-09-30 DIAGNOSIS — M545 Low back pain: Secondary | ICD-10-CM | POA: Diagnosis not present

## 2016-09-30 DIAGNOSIS — M5136 Other intervertebral disc degeneration, lumbar region: Secondary | ICD-10-CM | POA: Diagnosis not present

## 2016-10-17 ENCOUNTER — Telehealth: Payer: Self-pay

## 2016-10-17 NOTE — Telephone Encounter (Signed)
Left detailed message on voicemail of Elder Cyphers PT with [facility].

## 2016-10-17 NOTE — Telephone Encounter (Signed)
Elder Cyphers PT with [facility] left v/m requesting verbal order for make up of missed visit last week [place] PT visit.

## 2016-10-17 NOTE — Telephone Encounter (Signed)
Please give the order.  Thanks.   

## 2016-10-18 DIAGNOSIS — E559 Vitamin D deficiency, unspecified: Secondary | ICD-10-CM | POA: Diagnosis not present

## 2016-10-18 DIAGNOSIS — R69 Illness, unspecified: Secondary | ICD-10-CM | POA: Diagnosis not present

## 2016-10-18 DIAGNOSIS — M5430 Sciatica, unspecified side: Secondary | ICD-10-CM | POA: Diagnosis not present

## 2016-10-18 DIAGNOSIS — Z7901 Long term (current) use of anticoagulants: Secondary | ICD-10-CM | POA: Diagnosis not present

## 2016-10-18 DIAGNOSIS — M545 Low back pain: Secondary | ICD-10-CM | POA: Diagnosis not present

## 2016-10-18 DIAGNOSIS — M5136 Other intervertebral disc degeneration, lumbar region: Secondary | ICD-10-CM | POA: Diagnosis not present

## 2016-11-08 ENCOUNTER — Other Ambulatory Visit: Payer: Self-pay | Admitting: Family Medicine

## 2016-11-08 NOTE — Telephone Encounter (Signed)
Electronic refill request. Alprazolam Last office visit:   09/19/16 Last Filled:    60 tablet 1 08/30/2016  Please advise.

## 2016-11-09 NOTE — Telephone Encounter (Signed)
Please call in.  Thanks.   

## 2016-11-09 NOTE — Telephone Encounter (Signed)
Rx called to pharmacy as instructed. 

## 2016-11-11 ENCOUNTER — Encounter: Payer: Self-pay | Admitting: Family Medicine

## 2016-11-11 DIAGNOSIS — H53123 Transient visual loss, bilateral: Secondary | ICD-10-CM | POA: Diagnosis not present

## 2016-11-22 ENCOUNTER — Encounter: Payer: Self-pay | Admitting: Family Medicine

## 2016-11-25 ENCOUNTER — Encounter: Payer: Self-pay | Admitting: *Deleted

## 2016-11-28 ENCOUNTER — Telehealth: Payer: Self-pay

## 2016-11-28 NOTE — Telephone Encounter (Signed)
PLEASE NOTE: All timestamps contained within this report are represented as [place] Standard Time. CONFIDENTIALTY NOTICE: This fax transmission is intended only for the addressee. It contains information that is legally privileged, confidential or otherwise protected from use or disclosure. If you are not the intended recipient, you are strictly prohibited from reviewing, disclosing, copying using or disseminating any of this information or taking any action in reliance on or regarding this information. If you have received this fax in error, please notify us immediately by telephone so that we can arrange for its return to [place]. Phone: [phone], Toll-Free: [phone], Fax: [phone] Page: 1 of 1 Call Id: [id number] [place] Night - Client Nonclinical Telephone Record [place] Night - Client Client Site [place] - Night Contact Type Call Who Is Calling Patient / Member / Family / Caregiver Caller Name Declined to provide Caller Phone Number Declined to provide Call Type Message Only Information Provided Reason for Call Request for General Office Information Initial Comment Caller missed a call from the office. [name] 11/05/1935 Additional Comment Call Closed By: Mitchel Honour Transaction Date/Time: 11/25/2016 1:04:02 PM (ET)

## 2016-11-28 NOTE — Telephone Encounter (Signed)
I have tried to phone the patient on numerous occasions asking her to make a 30 minute appointment with [name] as a suggestion from her eye doctor.

## 2016-11-28 NOTE — Telephone Encounter (Signed)
Appointment scheduled.

## 2016-11-30 ENCOUNTER — Ambulatory Visit: Payer: Medicare HMO | Admitting: Family Medicine

## 2016-12-05 ENCOUNTER — Encounter: Payer: Self-pay | Admitting: Family Medicine

## 2016-12-05 ENCOUNTER — Ambulatory Visit (INDEPENDENT_AMBULATORY_CARE_PROVIDER_SITE_OTHER): Payer: Medicare HMO | Admitting: Family Medicine

## 2016-12-05 VITALS — BP 126/70 | HR 73 | Temp 97.5°F | Wt 181.8 lb

## 2016-12-05 DIAGNOSIS — R42 Dizziness and giddiness: Secondary | ICD-10-CM

## 2016-12-05 DIAGNOSIS — K219 Gastro-esophageal reflux disease without esophagitis: Secondary | ICD-10-CM | POA: Diagnosis not present

## 2016-12-05 DIAGNOSIS — R55 Syncope and collapse: Secondary | ICD-10-CM

## 2016-12-05 DIAGNOSIS — H539 Unspecified visual disturbance: Secondary | ICD-10-CM | POA: Diagnosis not present

## 2016-12-05 DIAGNOSIS — I4891 Unspecified atrial fibrillation: Secondary | ICD-10-CM

## 2016-12-05 DIAGNOSIS — Z23 Encounter for immunization: Secondary | ICD-10-CM

## 2016-12-05 LAB — COMPREHENSIVE METABOLIC PANEL
ALK PHOS: 94 U/L (ref 39–117)
ALT: 19 U/L (ref 0–35)
AST: 25 U/L (ref 0–37)
Albumin: 3.9 g/dL (ref 3.5–5.2)
BILIRUBIN TOTAL: 0.6 mg/dL (ref 0.2–1.2)
BUN: 15 mg/dL (ref 6–23)
CO2: 30 mEq/L (ref 19–32)
CREATININE: 0.88 mg/dL (ref 0.40–1.20)
Calcium: 9.6 mg/dL (ref 8.4–10.5)
Chloride: 105 mEq/L (ref 96–112)
GFR: 79.74 mL/min (ref >60.00)
GLUCOSE: 82 mg/dL (ref 70–99)
Potassium: 4.2 mEq/L (ref 3.5–5.1)
Sodium: 142 mEq/L (ref 135–145)
TOTAL PROTEIN: 7.5 g/dL (ref 6.0–8.3)

## 2016-12-05 LAB — CBC WITH DIFFERENTIAL/PLATELET
BASOS ABS: 0 10*3/uL (ref 0.0–0.1)
Basophils Relative: 0.4 % (ref 0.0–3.0)
EOS ABS: 0.1 10*3/uL (ref 0.0–0.7)
Eosinophils Relative: 1.7 % (ref 0.0–5.0)
HCT: 40.2 % (ref 36.0–46.0)
Hemoglobin: 13.2 g/dL (ref 12.0–15.0)
LYMPHS ABS: 1.7 10*3/uL (ref 0.7–4.0)
Lymphocytes Relative: 28 % (ref 12.0–46.0)
MCHC: 33 g/dL (ref 30.0–36.0)
MCV: 90.1 fl (ref 78.0–100.0)
MONO ABS: 0.4 10*3/uL (ref 0.1–1.0)
Monocytes Relative: 7.4 % (ref 3.0–12.0)
Neutro Abs: 3.8 10*3/uL (ref 1.4–7.7)
Neutrophils Relative %: 62.5 % (ref 43.0–77.0)
Platelets: 217 10*3/uL (ref 150.0–400.0)
RBC: 4.46 Mil/uL (ref 3.87–5.11)
RDW: 15.3 % (ref 11.5–15.5)
WBC: 6 10*3/uL (ref 4.0–10.5)

## 2016-12-05 LAB — TSH: TSH: 1.47 u[IU]/mL (ref 0.35–4.50)

## 2016-12-05 MED ORDER — LANSOPRAZOLE 30 MG PO CPDR: 30.0000 mg | DELAYED_RELEASE_CAPSULE | Freq: Every day | ORAL | 3 refills | Status: DC

## 2016-12-05 NOTE — Patient Instructions (Signed)
Go to the lab on the way out.  We'll contact you with your lab report. I changed the prescription for the stomach medicine.  I'll check with cardiology in the meantime.  We may need to get some pictures of the blood vessels in your neck- we'll be in touch about that.  Take care.  Glad to see you.

## 2016-12-07 ENCOUNTER — Other Ambulatory Visit: Payer: Self-pay | Admitting: Family Medicine

## 2016-12-07 NOTE — Telephone Encounter (Signed)
Last Ov 11/2016. Last Rx 08/2016 #90. pls advise

## 2016-12-08 DIAGNOSIS — R55 Syncope and collapse: Secondary | ICD-10-CM | POA: Insufficient documentation

## 2016-12-08 NOTE — Telephone Encounter (Signed)
Sent. Thanks.   

## 2016-12-08 NOTE — Assessment & Plan Note (Addendum)
This happened separately from the vision changes, at least as far as the timeline is considered.   Unclear if the two could be related o/w.  D/w pt about possible low BP vs arrhythmia vs TIA vs vertebrobasilar insuffiencey.  I will need cards input.  Check routine labs today.   Will notify cards and ask for arrhythmia eval.  See orders.  I didn't change her meds at this point.  >25 minutes spent in face to face time with patient, >50% spent in counselling or coordination of care.   I thank all involved, esp with cardiology and ophthalmology.

## 2016-12-08 NOTE — Assessment & Plan Note (Signed)
Okay to continue 30mg  lansoprazole.  rx sent.

## 2016-12-08 NOTE — Progress Notes (Signed)
Multiple issues.   Was seen at the eye clinic and I was notified about that- Dr. [name] Ina was concerned about darkening but not loss of vision in B eyes which can last for minutes at a time.  No absolute loss of vision, but it looks like she is wearing sunglasses when her vision should be clear o/w.  Verified with patient- has happened mult times, in B eyes, w/o field cut or total loss of vision.    Also, she had a syncopal eval at [employer].  Was sitting and "I was out, I woke up and they had took the collection."  Was in ~10/2016.  I didn't know about this until recently.  No other episodes.  occ lightheaded but not now, not dizzy.  No CP.  Not SOB.    She is considering getting remarried.  She met a gentleman and "he's as good as gold to me."  D/w pt.   She failed tx with 39m PPI but had control of sx with 36mdaily.  D/w pt.  No ADE on med.   PMH and SH reviewed  ROS: Per HPI unless specifically indicated in ROS section   Meds, vitals, and allergies reviewed.   GEN: nad, alert and oriented HEENT: mucous membranes moist NECK: supple w/o LA CV: sounds to be RRR, no ectopy noted.  PULM: ctab, no inc wob ABD: soft, +bs EXT: no edema SKIN: no acute rash CN 2-12 wnl B, S/S/DTR wnl x4

## 2016-12-15 ENCOUNTER — Ambulatory Visit
Admission: RE | Admit: 2016-12-15 | Discharge: 2016-12-15 | Disposition: A | Discharge status: Home / Self Care | Payer: Medicare HMO | Source: Ambulatory Visit | Attending: Family Medicine | Admitting: Family Medicine

## 2016-12-15 DIAGNOSIS — I700 Atherosclerosis of aorta: Secondary | ICD-10-CM | POA: Insufficient documentation

## 2016-12-15 DIAGNOSIS — I6521 Occlusion and stenosis of right carotid artery: Secondary | ICD-10-CM | POA: Insufficient documentation

## 2016-12-15 DIAGNOSIS — R55 Syncope and collapse: Secondary | ICD-10-CM | POA: Diagnosis present

## 2016-12-15 MED ORDER — IOPAMIDOL (ISOVUE-370) INJECTION 76%
75.0000 mL | Freq: Once | INTRAVENOUS | Status: AC | PRN
  Administered 2016-12-15: 75 mL via INTRAVENOUS

## 2017-01-08 DIAGNOSIS — M5136 Other intervertebral disc degeneration, lumbar region: Secondary | ICD-10-CM | POA: Diagnosis not present

## 2017-01-08 DIAGNOSIS — M5430 Sciatica, unspecified side: Secondary | ICD-10-CM | POA: Diagnosis not present

## 2017-01-08 DIAGNOSIS — M545 Low back pain: Secondary | ICD-10-CM | POA: Diagnosis not present

## 2017-01-08 DIAGNOSIS — E559 Vitamin D deficiency, unspecified: Secondary | ICD-10-CM | POA: Diagnosis not present

## 2017-02-24 ENCOUNTER — Other Ambulatory Visit: Payer: Self-pay | Admitting: Family Medicine

## 2017-02-24 NOTE — Telephone Encounter (Signed)
Electronic refill request. Alprazolam Last office visit:   12/05/16 Last Filled:    60 tablet 1 02/24/2017  Please advise.

## 2017-02-25 MED ORDER — ALPRAZOLAM 0.5 MG PO TABS: ORAL_TABLET | ORAL | 1 refills | Status: DC

## 2017-02-25 NOTE — Telephone Encounter (Signed)
Please call in.  Thanks.   

## 2017-02-27 NOTE — Telephone Encounter (Signed)
Faxed hard copy

## 2017-03-10 ENCOUNTER — Encounter: Payer: Self-pay | Admitting: Family Medicine

## 2017-03-10 ENCOUNTER — Ambulatory Visit (INDEPENDENT_AMBULATORY_CARE_PROVIDER_SITE_OTHER): Payer: Medicare HMO | Admitting: Family Medicine

## 2017-03-10 DIAGNOSIS — M545 Low back pain: Secondary | ICD-10-CM | POA: Diagnosis not present

## 2017-03-10 MED ORDER — CYCLOBENZAPRINE HCL 5 MG PO TABS: 5.0000 mg | ORAL_TABLET | Freq: Three times a day (TID) | ORAL | 1 refills | Status: DC | PRN

## 2017-03-10 MED ORDER — HYDROCODONE-ACETAMINOPHEN 5-325 MG PO TABS: 0.5000 | ORAL_TABLET | Freq: Four times a day (QID) | ORAL | 0 refills | Status: DC | PRN

## 2017-03-10 NOTE — Patient Instructions (Signed)
Try cyclobenzaprine for the muscle spasms.  If needed, use hydrocodone for pain.  Use heat and ice in the meantime.  Take care.  Glad to see you.  Update me as needed.

## 2017-03-10 NOTE — Progress Notes (Signed)
[place] about 2 weeks ago.  She was trying to get up in a chair, reaching up onto the top of the wardrobe.  Fell between the chair and the wardrobe.  No LOC.  Hit on her R side.  She had been trying to do some home exercises in the meantime but is still sore.  Still able to walk.    Meds, vitals, and allergies reviewed.   ROS: Per HPI unless specifically indicated in ROS section  nad ncat Neck supple rrr ctab Abd soft, not ttp Diffuse R sided back pain with R hip flexion but SLR neg o/w. Midline back w/o ttp Able to bear weight.

## 2017-03-12 NOTE — Assessment & Plan Note (Signed)
No concern for fx at this point.  [place] cautions d/w pt.  Try cyclobenzaprine for the muscle spasms.  If needed, use hydrocodone for pain.  Use heat and ice in the meantime.  Sedation caution on both medications.  Routine cautions given. Update me as needed.  She agrees with plan.

## 2017-04-06 ENCOUNTER — Telehealth: Payer: Self-pay | Admitting: Family Medicine

## 2017-04-06 ENCOUNTER — Other Ambulatory Visit: Payer: Self-pay | Admitting: *Deleted

## 2017-04-06 MED ORDER — ATORVASTATIN CALCIUM 80 MG PO TABS: 80.0000 mg | ORAL_TABLET | Freq: Every day | ORAL | 1 refills | Status: DC

## 2017-04-06 MED ORDER — FLUTICASONE PROPIONATE 50 MCG/ACT NA SUSP: 2.0000 | Freq: Every day | NASAL | 3 refills | Status: DC

## 2017-04-06 MED ORDER — SERTRALINE HCL 100 MG PO TABS: 100.0000 mg | ORAL_TABLET | Freq: Every day | ORAL | 3 refills | Status: DC

## 2017-04-06 MED ORDER — DILTIAZEM HCL ER BEADS 240 MG PO CP24: 240.0000 mg | ORAL_CAPSULE | Freq: Every day | ORAL | 1 refills | Status: DC

## 2017-04-06 MED ORDER — ISOSORBIDE MONONITRATE ER 60 MG PO TB24: 60.0000 mg | ORAL_TABLET | Freq: Every day | ORAL | 3 refills | Status: DC

## 2017-04-06 NOTE — Telephone Encounter (Signed)
Rx's sent to [facility] with the exception of Eliquis and Lansoprazole as requested by patient and also Cyclobenzaprine, Alprazolam and Hydrocodone sent to [name] for approval.

## 2017-04-06 NOTE — Telephone Encounter (Signed)
Patient will be using [facility] now and requests all Rx's except Lansoprazole and Eliquis sent to that pharmacy.  This has been done except for these that require Dr. Josefine Class approval. 1) Hydrocodone 5-325   Last Filled:    20 tablet 0 03/10/2017  2) Cyclobenzaprine 5 mg Last Filled:    30 tablet 1 03/10/2017  3) Alprazolam 0.5 mg Last Filled:     60 tablet 1 02/25/2017  Please advise.

## 2017-04-06 NOTE — Telephone Encounter (Signed)
Copied from [place] [id number]. Topic: Quick Communication - See Telephone Encounter >> Apr 06, 2017 12:08 PM Ether Griffins B wrote: CRM for notification. See Telephone encounter for:  Pt is switching to [facility] mail order pharmacy and she needs all of them switched to their except the eliquis and lansoprazole. She needs the refill on all over medications but the two that go to local pharmacy.  04/06/17.

## 2017-04-07 NOTE — Telephone Encounter (Signed)
Please get update on pain level and use of hydrocodone and flexeril.  Please get update on anxiety level and use of xanax.  Goal is to limit hydrocodone use.   Thanks.

## 2017-04-07 NOTE — Telephone Encounter (Signed)
Left detailed message on voicemail to return call with information. 

## 2017-04-07 NOTE — Telephone Encounter (Signed)
Patient states she is not taking the Hydrocodone at all and still has 3 or 4 pills left.  1) Patient is taking the Cyclobenzaprine as needed when she gets stiff.   2) Patient is taking Alprazolam 1 tablet per day and is doing well with that.

## 2017-04-09 MED ORDER — ALPRAZOLAM 0.5 MG PO TABS: 0.5000 mg | ORAL_TABLET | Freq: Every day | ORAL | 0 refills | Status: DC | PRN

## 2017-04-09 MED ORDER — CYCLOBENZAPRINE HCL 5 MG PO TABS: 5.0000 mg | ORAL_TABLET | Freq: Three times a day (TID) | ORAL | 0 refills | Status: DC | PRN

## 2017-04-09 NOTE — Telephone Encounter (Signed)
Update me if she has enough pain to need hydrocodone in the future.  I didn't send that in at this point.  I sent the other meds.  Update me as needed.  Thanks.

## 2017-04-10 NOTE — Telephone Encounter (Signed)
Patient says she doesn't have to use that much.

## 2017-04-24 ENCOUNTER — Telehealth: Payer: Self-pay | Admitting: Family Medicine

## 2017-04-24 MED ORDER — LANSOPRAZOLE 30 MG PO CPDR: 30.0000 mg | DELAYED_RELEASE_CAPSULE | Freq: Every day | ORAL | 2 refills | Status: DC

## 2017-04-24 NOTE — Telephone Encounter (Signed)
Copied from [place] #[id number]. Topic: Quick Communication - Rx Refill/Question >> Apr 24, 2017  2:34 PM Corie Chiquito, [place] wrote: Medication:Lansoprazole   Has the patient contacted their pharmacy? No  Patient calling because she still needs her Lansoprazole 30mg  refilled. Did advise patient to call her pharmacy as well  Preferred Pharmacy (with phone number or street name): [facility]  [address] [phone]   Agent: Please be advised that RX refills may take up to 3 business days. We ask that you follow-up with your pharmacy.

## 2017-04-24 NOTE — Telephone Encounter (Signed)
Patient called and informed lansoprazole was sent to [facility] 90 tabs/3 refills on 12/05/16, she said "I called because I want it to come in the mail with [employer]." I advised the prescription will be sent to [facility] today, she verbalized understanding.

## 2017-04-25 ENCOUNTER — Other Ambulatory Visit: Payer: Self-pay | Admitting: *Deleted

## 2017-04-25 MED ORDER — CYCLOBENZAPRINE HCL 5 MG PO TABS: 5.0000 mg | ORAL_TABLET | Freq: Three times a day (TID) | ORAL | 0 refills | Status: DC | PRN

## 2017-05-01 ENCOUNTER — Telehealth: Payer: Self-pay

## 2017-05-01 ENCOUNTER — Encounter: Payer: Self-pay | Admitting: Family Medicine

## 2017-05-01 DIAGNOSIS — H53123 Transient visual loss, bilateral: Secondary | ICD-10-CM | POA: Diagnosis not present

## 2017-05-01 NOTE — Telephone Encounter (Signed)
Copied from [place]. Topic: Quick Communication - See Telephone Encounter >> May 01, 2017  2:07 PM Oneta Rack wrote: Relation to pt: self Call back number:[phone]   Reason for call:  Patient wanted inform [name] she was seen today by at [facility] and she would like to know PCP recommendation. Specialist states office notes faxed over to PCP, please advise >> May 01, 2017  2:14 PM Oneta Rack wrote: Relation to pt: self Call back number:[phone]   Reason for call:  Patient wanted to inform [name] she was seen today by Dr. Birder Robson at [facility] and she would like to know PCP recommendation. Specialist states office notes faxed over to PCP for review.patient states she is concerned about the findings, please advise

## 2017-05-04 NOTE — Telephone Encounter (Signed)
No answer, try again later.

## 2017-05-04 NOTE — Telephone Encounter (Signed)
Dr. [name] Ina was concerned about possible vertebrobasilar insufficiency but patient had CTA done. I called and left him a message.   On recent study (done 12/15/16): Both vertebral artery origins are widely patent. The left vertebral artery is dominant. Both vertebral arteries are widely patent through the cervical region and foramen magnum to the basilar.  If still having sx, then I think the patient needs to see cards.  She has seen Dr. Doreene Nest.   Please have patient call about with Dr. Ubaldo Glassing, who should be able to see notes from our clinic in the EMR.  I'm glad to see her but I think she should f/u with cards.  Thanks.

## 2017-05-04 NOTE — Telephone Encounter (Signed)
No answer, try again tomorrow

## 2017-05-05 NOTE — Telephone Encounter (Signed)
Patient advised and says she has called for an appointment and the nurse at [facility] is supposed to call her back to get it set up.

## 2017-05-15 ENCOUNTER — Other Ambulatory Visit: Payer: Self-pay | Admitting: Family Medicine

## 2017-05-15 NOTE — Telephone Encounter (Signed)
Eliquis refill. Chart showing medication previously filled by historical provider.   LOV: 12/05/16   Dr. Rh[facility]

## 2017-05-15 NOTE — Telephone Encounter (Signed)
Electronic refill request Filled previously by Dr. Clearnce Hasten Not sure of last refill date

## 2017-05-15 NOTE — Telephone Encounter (Signed)
Copied from [place]. Topic: Quick Communication - Rx Refill/Question >> May 15, 2017  9:14 AM Aurelio Brash B wrote: Medication: apixaban (ELIQUIS) 5 MG TABS tablet   Has the patient contacted their pharmacy? yes   (Agent: If no, request that the patient contact the pharmacy for the refill.)   Preferred Pharmacy (with phone number or street name): [place], [place] [phone] (Phone) [phone] (Fax     Agent: Please be advised that RX refills may take up to 3 business days. We ask that you follow-up with your pharmacy.

## 2017-05-15 NOTE — Telephone Encounter (Signed)
This needs to come through cardiology- [facility] cards. Please send over to them.  Thanks.

## 2017-07-27 ENCOUNTER — Encounter: Payer: Self-pay | Admitting: Family Medicine

## 2017-07-27 ENCOUNTER — Ambulatory Visit (INDEPENDENT_AMBULATORY_CARE_PROVIDER_SITE_OTHER): Payer: Medicare HMO | Admitting: Family Medicine

## 2017-07-27 VITALS — BP 138/70 | HR 69 | Temp 99.0°F | Ht 65.0 in | Wt 203.5 lb

## 2017-07-27 DIAGNOSIS — R51 Headache: Secondary | ICD-10-CM | POA: Diagnosis not present

## 2017-07-27 DIAGNOSIS — R519 Headache, unspecified: Secondary | ICD-10-CM

## 2017-07-27 DIAGNOSIS — H547 Unspecified visual loss: Secondary | ICD-10-CM

## 2017-07-27 MED ORDER — ISOSORBIDE MONONITRATE ER 60 MG PO TB24: ORAL_TABLET | ORAL | Status: DC

## 2017-07-27 NOTE — Progress Notes (Signed)
Headaches, constant.  Frontal/forehead.  Going on for about 1 month.  Present before/after sleep.  Taking tylenol w/o much relief.  Throbbing HA.  No CP, SOB, BLE edema.  No fevers.  No vomiting.    She had transient partial vision loss in L eye that lasted a few minutes then returned to baseline.  This was days ago. No sx now.  No other sx in the meantime.  She is already anticoagulated on Eliquis.  She is taking high-dose statin.  She has prev carotid imaging with Atherosclerotic plaque at both carotid bifurcations. 30% stenosis of the proximal ICA on the right. No stenosis measurable on the left- this was done last year.  No other focal neurologic symptoms in the meantime.  No persistent vision loss.  PMH and SH reviewed  ROS: Per HPI unless specifically indicated in ROS section   Meds, vitals, and allergies reviewed.   GEN: nad, alert and oriented HEENT: mucous membranes moist, TM wnl, sinuses not ttp  NECK: supple w/o LA, no bruit CV: not tachy with ectopy noted.  PULM: ctab, no inc wob ABD: soft, +bs EXT: trace BLE edema SKIN: no acute rash CN 2-12 wnl B, S/S/DTR wnl x4 except for baseline asymmetric change in smile that are not new.

## 2017-07-27 NOTE — Patient Instructions (Signed)
We will call about your referral.  Rosaria Ferries or Azalee Course will call you if you don't see one of them on the way out.  If you have more episodes of vision loss then go to the ER.  Stop Isosorbide and see if headache improves.  Update me early next week.  Take care.  Glad to see you.

## 2017-07-30 ENCOUNTER — Encounter: Payer: Self-pay | Admitting: Family Medicine

## 2017-07-30 DIAGNOSIS — R519 Headache, unspecified: Secondary | ICD-10-CM | POA: Insufficient documentation

## 2017-07-30 DIAGNOSIS — H547 Unspecified visual loss: Secondary | ICD-10-CM | POA: Insufficient documentation

## 2017-07-30 DIAGNOSIS — R51 Headache: Secondary | ICD-10-CM

## 2017-07-30 NOTE — Assessment & Plan Note (Signed)
This seems to be completely separate from the headache.  She is already anticoagulated with Eliquis.  No persistent no focal neurologic symptoms in the meantime.  She has no residual vision loss.  Discussed with patient about options.  Reasonable to recheck carotids given the symptoms that she had.  See notes on imaging.  At this point still okay for outpatient follow-up with routine emergency cautions given.

## 2017-07-30 NOTE — Assessment & Plan Note (Signed)
This may be related to her long-acting nitrate.  I think this is unrelated to the previous vision loss.  Stop isosorbide for now.  See after visit summary.  Rationale discussed with patient.  She agrees.  She will update me early next week. >25 minutes spent in face to face time with patient, >50% spent in counselling or coordination of care, discussing headache, previous imaging, vision loss, etc., rationale for treatment.

## 2017-08-01 ENCOUNTER — Telehealth: Payer: Self-pay

## 2017-08-01 NOTE — Telephone Encounter (Signed)
Copied from [place] [id number]. Topic: General - Other >> Aug 01, 2017  2:03 PM Carolyn Stare wrote:  Pt call to say she has not taken isosorbide mononitrate (IMDUR) 60 MG 24 hr tablet since last Friday and she has not had an headache and is asking if she need to continue to stay off the med, would like a call back   [phone]

## 2017-08-02 NOTE — Telephone Encounter (Signed)
I would stay off the medicine.  I added it to her intolerance list.  Thanks.

## 2017-08-02 NOTE — Telephone Encounter (Signed)
Patient notified as instructed by telephone and verbalized understanding. 

## 2017-08-15 ENCOUNTER — Ambulatory Visit (INDEPENDENT_AMBULATORY_CARE_PROVIDER_SITE_OTHER): Payer: Medicare HMO

## 2017-08-15 ENCOUNTER — Encounter

## 2017-08-15 DIAGNOSIS — H547 Unspecified visual loss: Secondary | ICD-10-CM

## 2017-08-21 ENCOUNTER — Encounter: Payer: Self-pay | Admitting: Family Medicine

## 2017-08-21 ENCOUNTER — Ambulatory Visit (INDEPENDENT_AMBULATORY_CARE_PROVIDER_SITE_OTHER): Payer: Medicare HMO | Admitting: Family Medicine

## 2017-08-21 DIAGNOSIS — M109 Gout, unspecified: Secondary | ICD-10-CM | POA: Diagnosis not present

## 2017-08-21 DIAGNOSIS — R609 Edema, unspecified: Secondary | ICD-10-CM | POA: Diagnosis not present

## 2017-08-21 DIAGNOSIS — I4891 Unspecified atrial fibrillation: Secondary | ICD-10-CM

## 2017-08-21 DIAGNOSIS — R600 Localized edema: Secondary | ICD-10-CM

## 2017-08-21 MED ORDER — APIXABAN 5 MG PO TABS
5.0000 mg | ORAL_TABLET | Freq: Every day | ORAL | Status: DC
Start: 2017-08-21 — End: 2019-04-11

## 2017-08-21 MED ORDER — COLCHICINE 0.6 MG PO TABS: 0.6000 mg | ORAL_TABLET | Freq: Every day | ORAL | 0 refills | Status: DC | PRN

## 2017-08-21 NOTE — Assessment & Plan Note (Signed)
Already better, likely from transient issue with partial blockage of duct from a stone.  Can use lemon candy, update me as needed.  OP exam wnl o/w. No oral erythema.  No oral mass o/w, no ulceration.  Should resolve.  Update me as needed.

## 2017-08-21 NOTE — Patient Instructions (Signed)
Lemon candy or cough drop may help with the presumed salivary gland swelling.  Use colchicine if needed for a presumed gout flare.  If you have to take colchicine, then stop the atorvastatin temporarily.   Take care.  Glad to see you.

## 2017-08-21 NOTE — Assessment & Plan Note (Signed)
Likely, better now.  No trauma.  No pain that would dictate imaging at this point.  Hold colchicine for now.  Hold statin if using colchicine.  Update me as needed.  She agrees.

## 2017-08-21 NOTE — Progress Notes (Signed)
Her HA are better off nitrates.    Prev carotid eval d/w pt.  She is on statin.  She is on eliquis.  No more vision loss.  D/w pt about rationale for treatment as is.    H/o gout- had a flare about 1 year ago.  More recent sx.  Started in the toes, R>L foot. Painful.  Recently onset of this flare. Pain walking.  Pain with the sheet laying on the foot. Clearly better in the meantime.    Recently with relatively quick onset R parotid area swelling (over a few days) that got better very quickly w/o intervention- much better now.  No trauma.    Meds, vitals, and allergies reviewed.   ROS: Per HPI unless specifically indicated in ROS section   nad ncat Minimal puffiness on the R anterior portion of the parotid w/o LA o/w.  Not ttp, no redness Neck supple, no LA RRR ctab B feet with normal inspection.  No foot pain now, not ttp, able ot bear weight.  Normal DP pulses.  No foot erythema.

## 2017-08-21 NOTE — Assessment & Plan Note (Signed)
Still anticoagulated with recent carotid study w/o sig change.  No change in meds. D/w pt. She agrees.  No new neuro sx in the meantime.

## 2017-10-18 IMAGING — CT CT ABD-PELV W/ CM
1 of 3 series · 13 of 32 positions shown, 18 images · IV contrast (APPLIED)
Comparison: CT abdomen pelvis of 04/05/2013

CLINICAL DATA: Diffuse abdominal pain, diarrhea over the last year

EXAM:
CT ABDOMEN AND PELVIS WITH CONTRAST
TECHNIQUE: Multidetector CT imaging of the abdomen and pelvis was performed
using the standard protocol following bolus administration of
intravenous contrast.
CONTRAST:  100mL 4SSOG2-N33 IOPAMIDOL (4SSOG2-N33) INJECTION 61%

[Series 2: axial st · axial · 0.79mm/px · z∈[-1085,-660]mm · 13 of 95 slices shown, 18 images]
[im 5/95  soft-tissue]
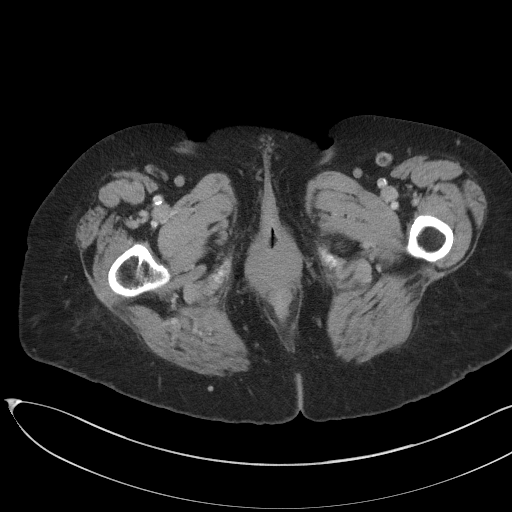
[im 5/95  bone]
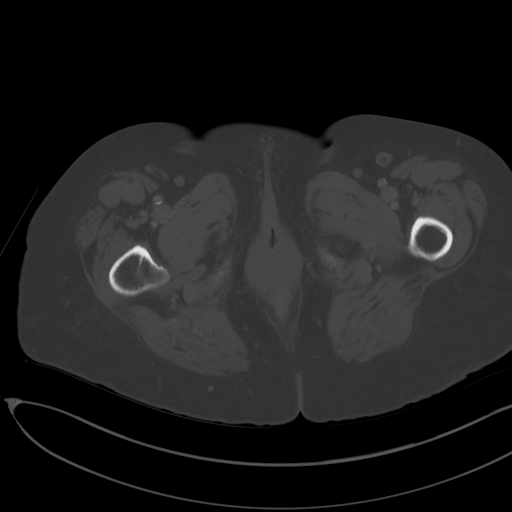
[im 15/95  soft-tissue]
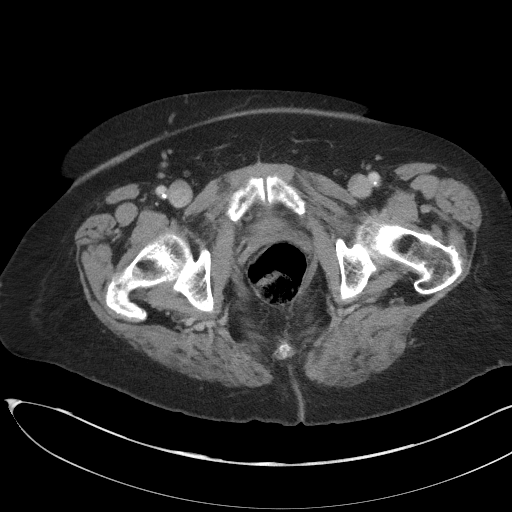
[im 20/95  soft-tissue]
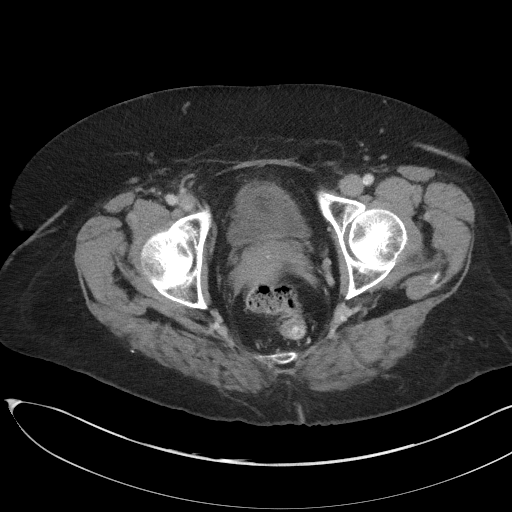
[im 30/95  soft-tissue]
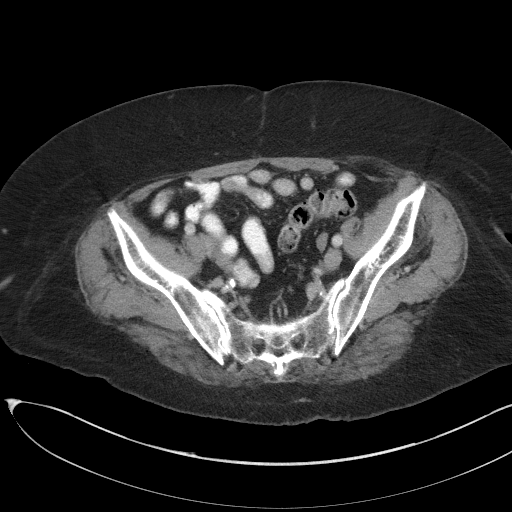
[im 35/95  soft-tissue]
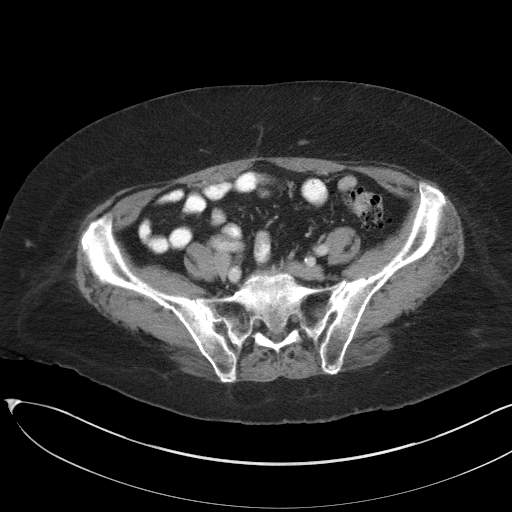
[im 45/95  soft-tissue]
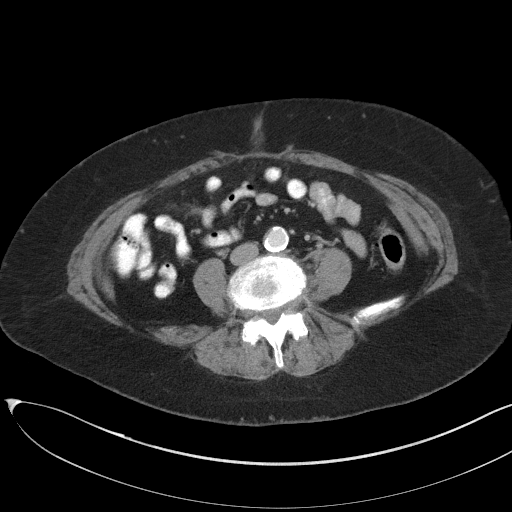
[im 50/95  soft-tissue]
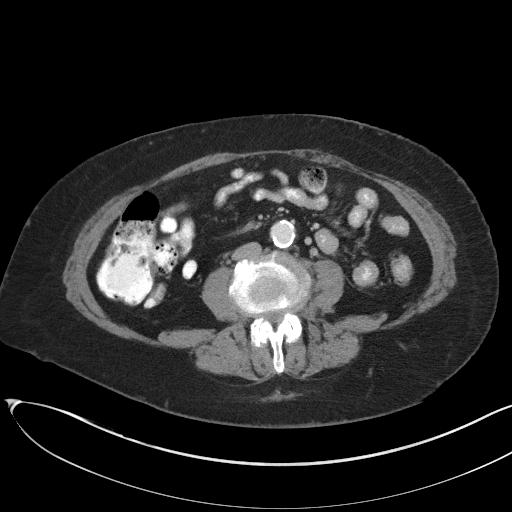
[im 60/95  soft-tissue]
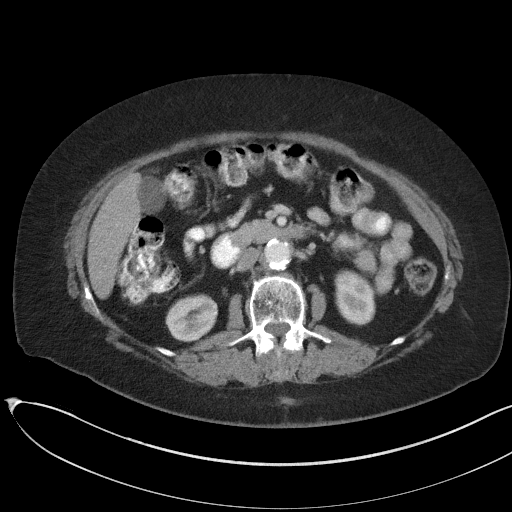
[im 65/95  soft-tissue]
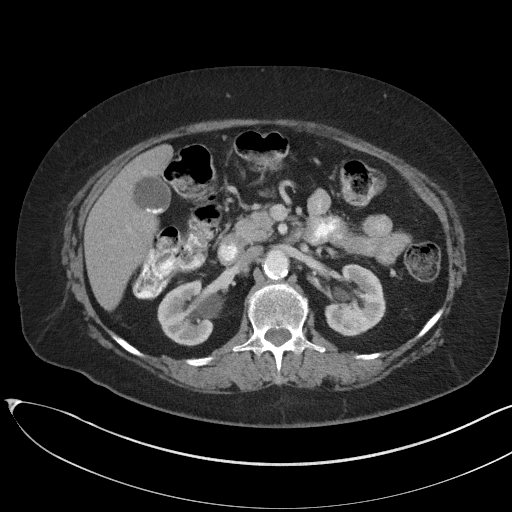
[im 65/95  bone]
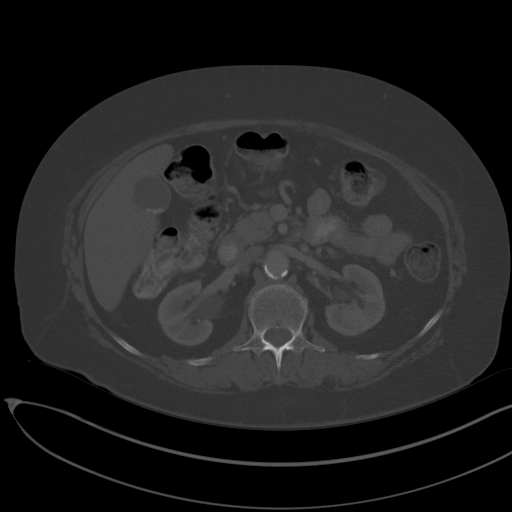
[im 75/95  soft-tissue]
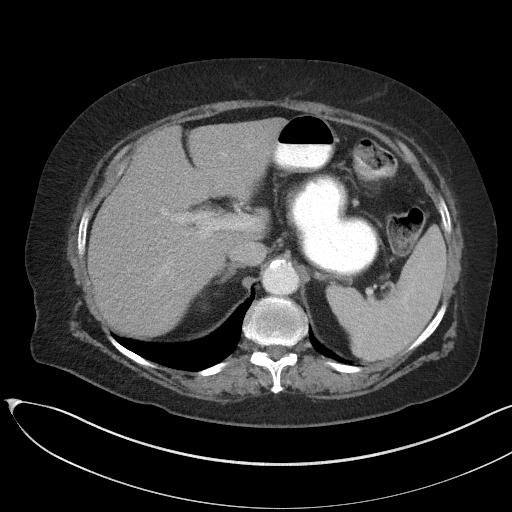
[im 75/95  lung]
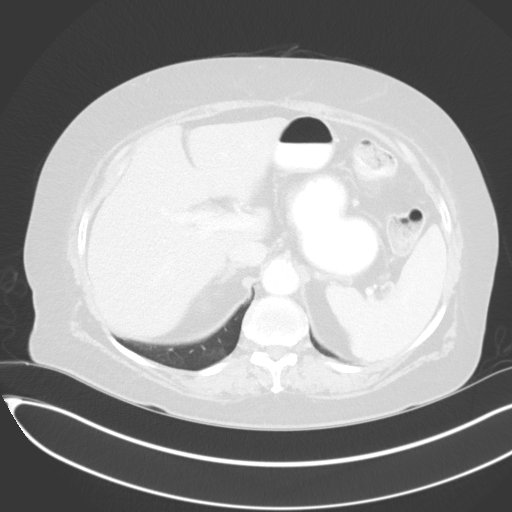
[im 80/95  soft-tissue]
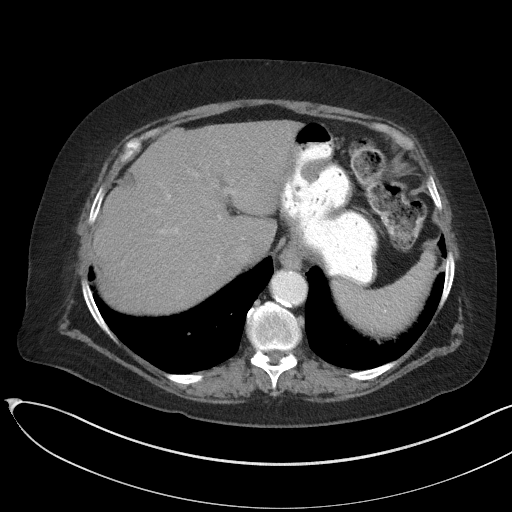
[im 80/95  lung]
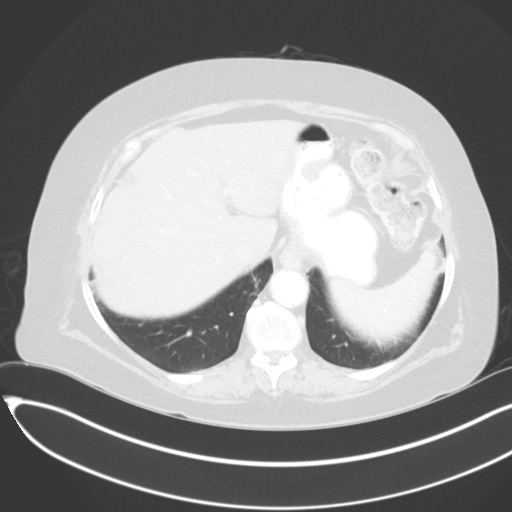
[im 85/95  lung]
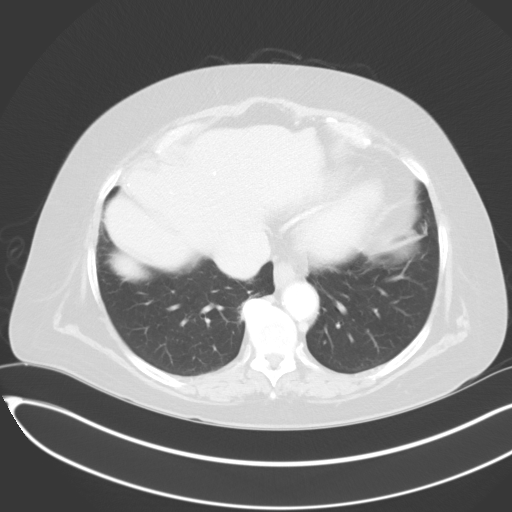
[im 90/95  soft-tissue]
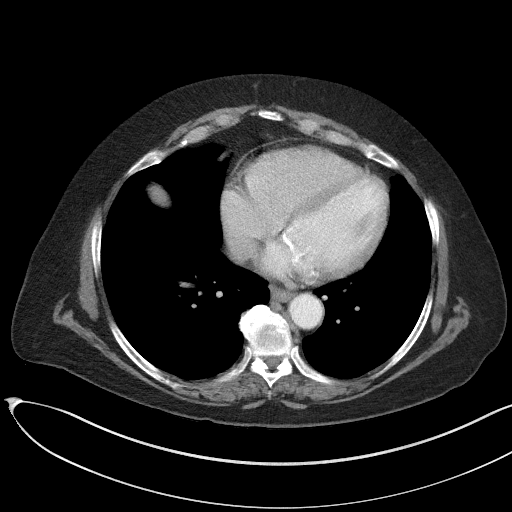
[im 90/95  lung]
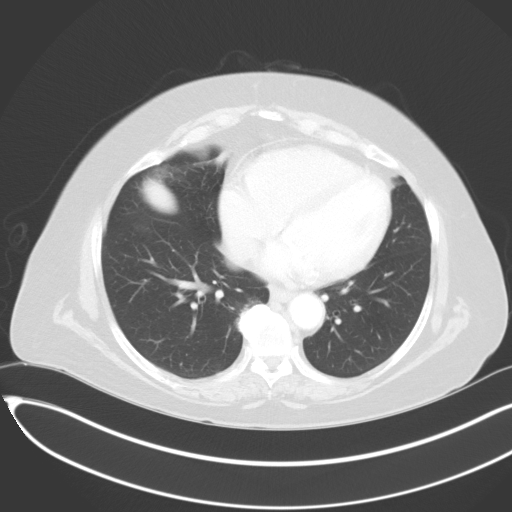

[13 of 32 positions shown; findings below may reference images not displayed]

FINDINGS: Lower chest: The lung bases are clear. The heart is mildly enlarged
and there is mitral annular calcification.

Hepatobiliary: The liver enhances without focal abnormality. There
are small calcified hepatic granulomas consistent with prior
granulomatous disease. Small gallstones layer within the
gallbladder.

Pancreas: The pancreas is normal in size and the pancreatic duct is
not dilated.

Spleen: Spleen is normal in size. There are splenic calcified
granulomas consistent with prior granulomatous disease.

Adrenals/Urinary Tract: The adrenal glands are unremarkable. The
kidneys enhance with no calculus or mass and on delayed images, the
pelvocaliceal systems are unremarkable. The ureters are normal in
caliber. The urinary bladder is not well distended but no
abnormality is seen.

Stomach/Bowel: The stomach is slightly distended with oral contrast
and food debris with no abnormality noted. No small bowel distention
is seen. There a few scattered rectosigmoid colon and descending
colon diverticula present. No diverticulitis is seen. No colonic
mucosal edema is seen. The terminal ileum is unremarkable. The
appendix by history has previously been resected.

Vascular/Lymphatic: The abdominal aorta is normal in caliber with
considerable abdominal aortic atherosclerosis noted. No adenopathy
is seen.

Reproductive: The uterus is normal in size with a few calcifications
consistent with small calcified uterine fibroids. No adnexal lesion
is seen. No fluid is noted within the pelvis.

Other: Some fat does enter the left inguinal canal.

Musculoskeletal: The lumbar vertebrae are in normal alignment. There
is degenerative change involving the facet joints of the lower
lumbar spine. No compression deformity is seen.
IMPRESSION: 1. No explanation for the patient's abdominal pain and diarrhea is
seen. There are a few scattered rectosigmoid and distal descending
colon diverticula present.
2. Changes of prior granulomatous disease with calcified hepatic and
splenic granulomas noted.
3. Small gallstones layer within the gallbladder.
4. Abdominal aortic atherosclerosis.

## 2017-10-19 ENCOUNTER — Other Ambulatory Visit: Payer: Self-pay | Admitting: Family Medicine

## 2017-10-19 DIAGNOSIS — Z1231 Encounter for screening mammogram for malignant neoplasm of breast: Secondary | ICD-10-CM

## 2017-11-02 ENCOUNTER — Encounter: Payer: Self-pay | Admitting: Family Medicine

## 2017-11-02 ENCOUNTER — Ambulatory Visit (INDEPENDENT_AMBULATORY_CARE_PROVIDER_SITE_OTHER): Payer: Medicare HMO | Admitting: Family Medicine

## 2017-11-02 VITALS — BP 116/70 | HR 60 | Temp 98.1°F | Ht 65.0 in | Wt 211.5 lb

## 2017-11-02 DIAGNOSIS — R1012 Left upper quadrant pain: Secondary | ICD-10-CM | POA: Diagnosis not present

## 2017-11-02 LAB — COMPREHENSIVE METABOLIC PANEL
ALBUMIN: 3.9 g/dL (ref 3.5–5.2)
ALK PHOS: 103 U/L (ref 39–117)
ALT: 12 U/L (ref 0–35)
AST: 16 U/L (ref 0–37)
BILIRUBIN TOTAL: 0.6 mg/dL (ref 0.2–1.2)
BUN: 14 mg/dL (ref 6–23)
CO2: 31 mEq/L (ref 19–32)
CREATININE: 0.86 mg/dL (ref 0.40–1.20)
Calcium: 9.5 mg/dL (ref 8.4–10.5)
Chloride: 103 mEq/L (ref 96–112)
GFR: 81.69 mL/min (ref >60.00)
GLUCOSE: 89 mg/dL (ref 70–99)
Potassium: 4.1 mEq/L (ref 3.5–5.1)
SODIUM: 139 meq/L (ref 135–145)
TOTAL PROTEIN: 7.7 g/dL (ref 6.0–8.3)

## 2017-11-02 LAB — CBC WITH DIFFERENTIAL/PLATELET
BASOS ABS: 0 10*3/uL (ref 0.0–0.1)
Basophils Relative: 0.4 % (ref 0.0–3.0)
EOS ABS: 0.1 10*3/uL (ref 0.0–0.7)
Eosinophils Relative: 1.3 % (ref 0.0–5.0)
HCT: 38.3 % (ref 36.0–46.0)
HEMOGLOBIN: 12.6 g/dL (ref 12.0–15.0)
LYMPHS ABS: 2 10*3/uL (ref 0.7–4.0)
Lymphocytes Relative: 31.5 % (ref 12.0–46.0)
MCHC: 33 g/dL (ref 30.0–36.0)
MCV: 87.2 fl (ref 78.0–100.0)
MONO ABS: 0.4 10*3/uL (ref 0.1–1.0)
Monocytes Relative: 5.8 % (ref 3.0–12.0)
NEUTROS PCT: 61 % (ref 43.0–77.0)
Neutro Abs: 3.9 10*3/uL (ref 1.4–7.7)
Platelets: 198 10*3/uL (ref 150.0–400.0)
RBC: 4.4 Mil/uL (ref 3.87–5.11)
RDW: 14.4 % (ref 11.5–15.5)
WBC: 6.4 10*3/uL (ref 4.0–10.5)

## 2017-11-02 LAB — LIPASE: LIPASE: 29 U/L (ref 11.0–59.0)

## 2017-11-02 MED ORDER — LANSOPRAZOLE 30 MG PO CPDR: 30.0000 mg | DELAYED_RELEASE_CAPSULE | Freq: Two times a day (BID) | ORAL | Status: DC

## 2017-11-02 NOTE — Patient Instructions (Addendum)
Go to the lab on the way out.  We'll contact you with your lab report. Double up on the prevacid- take it twice a day and see if that helps.  We'll be in touch.  Take care.  Glad to see you.

## 2017-11-02 NOTE — Progress Notes (Signed)
"  I just don't feel good and every time I eat something it just tears my stomach to pieces."  Upper abd pain, LUQ.  Vomiting. No blood in vomitus.  Pain comes and goes, last 5-10 minutes.  Some tingling other times of the day.  Overall going on for about 1 month, getting worse.  No black stools.  Some diarrhea, most of the time but not every time.  No blood in stools.  Still on eliquis. Eating eggs makes it worse, not trouble with drinking water.  No fevers.  No others with similar.   No right upper quadrant pain.  Meds, vitals, and allergies reviewed.   ROS: Per HPI unless specifically indicated in ROS section   GEN: nad, alert and oriented HEENT: mucous membranes moist NECK: supple w/o LA CV: rrr. PULM: ctab, no inc wob ABD: soft, +bs, left upper quadrant slightly tender to palpation without rebound.  Abdomen nontender palpation otherwise EXT: no edema SKIN: no acute rash

## 2017-11-04 LAB — H PYLORI, IGM, IGG, IGA AB
H. pylori, IgA Abs: <9 units (ref 0.0–8.9)
H. pylori, IgG AbS: <0.8 Index Value (ref 0.00–0.79)

## 2017-11-05 DIAGNOSIS — R1012 Left upper quadrant pain: Secondary | ICD-10-CM | POA: Insufficient documentation

## 2017-11-05 NOTE — Assessment & Plan Note (Signed)
Upper abdominal pain, left upper quadrant.  Episodic symptoms.  Not vomiting blood.  Discussed with patient about options.  Reasonable to increase lansoprazole to twice daily dosing for now.  Check H. pylori labs.  Minimal tenderness on exam, still okay for outpatient follow-up.  It does not seem to be that she has a colonic source, based on her symptoms.  Her right upper quadrant is not tender. >25 minutes spent in face to face time with patient, >50% spent in counselling or coordination of care .

## 2017-11-08 ENCOUNTER — Ambulatory Visit
Admission: RE | Admit: 2017-11-08 | Discharge: 2017-11-08 | Disposition: A | Discharge status: Home / Self Care | Payer: Medicare HMO | Source: Ambulatory Visit | Attending: Family Medicine | Admitting: Family Medicine

## 2017-11-08 DIAGNOSIS — Z1231 Encounter for screening mammogram for malignant neoplasm of breast: Secondary | ICD-10-CM | POA: Insufficient documentation

## 2017-11-09 ENCOUNTER — Encounter: Payer: Self-pay | Admitting: *Deleted

## 2017-11-10 ENCOUNTER — Telehealth: Payer: Self-pay | Admitting: Family Medicine

## 2017-11-10 NOTE — Telephone Encounter (Signed)
Patient notified of lab results

## 2017-11-10 NOTE — Telephone Encounter (Signed)
Copied from [place] [id number]. Topic: General - Other >> Nov 10, 2017  9:10 AM Yvette Rack wrote: Reason for CRM: pt calling for lab results

## 2017-12-05 ENCOUNTER — Other Ambulatory Visit: Payer: Self-pay | Admitting: Family Medicine

## 2017-12-06 NOTE — Telephone Encounter (Signed)
Electronic refill request Last office visit 11/02/17 Last refill 04/09/17 #90 See allergy/contraindication

## 2017-12-06 NOTE — Telephone Encounter (Signed)
Sent. Thanks.  Okay to continue. 

## 2017-12-11 ENCOUNTER — Other Ambulatory Visit: Payer: Self-pay | Admitting: Family Medicine

## 2017-12-11 MED ORDER — LANSOPRAZOLE 30 MG PO CPDR: 30.0000 mg | DELAYED_RELEASE_CAPSULE | Freq: Two times a day (BID) | ORAL | 1 refills | Status: DC

## 2017-12-11 NOTE — Telephone Encounter (Signed)
Sent. Thanks.   

## 2017-12-11 NOTE — Telephone Encounter (Signed)
Copied from [place] [id number]. Topic: Quick Communication - Rx Refill/Question >> Dec 11, 2017  2:58 PM Scherrie Gerlach wrote: Medication: lansoprazole (PREVACID) 30 MG capsule  Pt states dr changed the order for this med, and ok'd for her to take 2.  Pt states 2 a day is working good, and needs early refill 90 day [place], [place] [phone] (Phone) [phone] (Fax)

## 2018-01-02 ENCOUNTER — Ambulatory Visit (INDEPENDENT_AMBULATORY_CARE_PROVIDER_SITE_OTHER)
Admission: RE | Admit: 2018-01-02 | Discharge: 2018-01-02 | Disposition: A | Discharge status: Home / Self Care | Payer: Medicare HMO | Source: Ambulatory Visit | Attending: Family Medicine | Admitting: Family Medicine

## 2018-01-02 ENCOUNTER — Encounter: Payer: Self-pay | Admitting: Family Medicine

## 2018-01-02 ENCOUNTER — Ambulatory Visit (INDEPENDENT_AMBULATORY_CARE_PROVIDER_SITE_OTHER): Payer: Medicare HMO | Admitting: Family Medicine

## 2018-01-02 ENCOUNTER — Encounter (INDEPENDENT_AMBULATORY_CARE_PROVIDER_SITE_OTHER): Payer: Self-pay

## 2018-01-02 VITALS — BP 160/72 | HR 67 | Temp 99.2°F | Ht 65.0 in | Wt 212.2 lb

## 2018-01-02 DIAGNOSIS — R109 Unspecified abdominal pain: Secondary | ICD-10-CM | POA: Diagnosis not present

## 2018-01-02 DIAGNOSIS — R300 Dysuria: Secondary | ICD-10-CM

## 2018-01-02 LAB — POC URINALSYSI DIPSTICK (AUTOMATED)
BILIRUBIN UA: NEGATIVE
Blood, UA: NEGATIVE
Glucose, UA: NEGATIVE
KETONES UA: NEGATIVE
Leukocytes, UA: NEGATIVE
Nitrite, UA: NEGATIVE
PH UA: 6 (ref 5.0–8.0)
Protein, UA: NEGATIVE
Urobilinogen, UA: 1 E.U./dL

## 2018-01-02 MED ORDER — CYCLOBENZAPRINE HCL 5 MG PO TABS: 5.0000 mg | ORAL_TABLET | Freq: Three times a day (TID) | ORAL | 0 refills | Status: DC | PRN

## 2018-01-02 NOTE — Progress Notes (Signed)
She was having some pain from the R waist, across the lower abd and the R thigh.  Going on for about 2 weeks. It was getting some better, then returned.  Sx are intermittent.  Noted more laying down.  Having to urinate more often.  No blood in urine.  Drinking more water in the meantime.  No burning with urination.  No vomiting but some nausea.  No fevers.  No blood in stool, "my bowels are doing great."  This episode feels like a prev kidney stone.  She hasn't passed a stone recently.    She has more financial strains, d/w pt.  She still has allergy sx, in spite of flonase use.    She has used flexeril in the past for muscle pain and needed a refill.  Meds, vitals, and allergies reviewed.   ROS: Per HPI unless specifically indicated in ROS section   nad ncat Mmm Neck supple, no LA rrr ctab abd soft, no cva pain.  Suprapubic area slightly ttp but abdomen is not tender otherwise.  No rebound.  Normal bowel sounds. Extremities without edema. She has some R quad pain on ROM, with hip flexion but this is not a true positive straight leg raise since she does not have sciatica symptoms.

## 2018-01-02 NOTE — Patient Instructions (Addendum)
We'll contact you with your lab report. Keep drinking plenty of water.  Use the flexeril if needed for muscle pain in your leg.  It can make you drowsy.  Take care.  Glad to see you.  Update me as needed.

## 2018-01-03 LAB — URINE CULTURE
MICRO NUMBER: 91268843
RESULT: NO GROWTH
SPECIMEN QUALITY:: ADEQUATE

## 2018-01-03 NOTE — Assessment & Plan Note (Addendum)
She did not have flank pain at the time of the visit.  She had some mild suprapubic tenderness.  Overall she is not having severe symptoms right now and she appears to be improved.  It could be that she has 2 separate issues going on.  She could have a urinary tract issue (nephrolithiasis, cystitis) along with a muscle strain in her right quad.  Discussed options.  Urinalysis without blood.  Check urine culture.  See notes on KUB.  Discussed with patient at office visit.  At this point still okay for outpatient follow-up.  Okay to use Flexeril as needed for right quad pain. >25 minutes spent in face to face time with patient, >50% spent in counselling or coordination of care

## 2018-01-04 ENCOUNTER — Telehealth: Payer: Self-pay

## 2018-01-04 ENCOUNTER — Other Ambulatory Visit: Payer: Self-pay | Admitting: Family Medicine

## 2018-01-04 NOTE — Telephone Encounter (Addendum)
I tried calling pt and the phone # is temporarily suspended. The [phone] # rang with no answer and no v/m. Pt last seen afib 08/21/17. Pt called back. Pt thinks cardiologist started eliquis and thinks last visit was told to stop eliquis by Dr Ubaldo Glassing. I did warm transfer to [facility] at Dr Bethanne Ginger office for pt to ck if should still be taking Eliquis. FYI to Dr Damita Dunnings.

## 2018-01-04 NOTE — Telephone Encounter (Signed)
Noted. Thanks.  I'll defer to cards.

## 2018-01-04 NOTE — Telephone Encounter (Signed)
Copied from [place] [id number]. Topic: General - Other >> Jan 04, 2018 11:03 AM Ivar Drape wrote: Reason for CRM:   Patient wants to know if she should still be taking apixaban (ELIQUIS) 5 MG TABS tablet medication.  Please advise.

## 2018-01-31 ENCOUNTER — Ambulatory Visit (INDEPENDENT_AMBULATORY_CARE_PROVIDER_SITE_OTHER): Payer: Medicare HMO | Admitting: Family Medicine

## 2018-01-31 ENCOUNTER — Encounter: Payer: Self-pay | Admitting: Family Medicine

## 2018-01-31 ENCOUNTER — Ambulatory Visit (INDEPENDENT_AMBULATORY_CARE_PROVIDER_SITE_OTHER)
Admission: RE | Admit: 2018-01-31 | Discharge: 2018-01-31 | Disposition: A | Discharge status: Home / Self Care | Payer: Medicare HMO | Source: Ambulatory Visit | Attending: Family Medicine | Admitting: Family Medicine

## 2018-01-31 VITALS — BP 142/76 | HR 65 | Temp 98.3°F | Wt 208.0 lb

## 2018-01-31 DIAGNOSIS — R062 Wheezing: Secondary | ICD-10-CM

## 2018-01-31 DIAGNOSIS — R0602 Shortness of breath: Secondary | ICD-10-CM | POA: Diagnosis not present

## 2018-01-31 DIAGNOSIS — J22 Unspecified acute lower respiratory infection: Secondary | ICD-10-CM

## 2018-01-31 DIAGNOSIS — R05 Cough: Secondary | ICD-10-CM

## 2018-01-31 DIAGNOSIS — B370 Candidal stomatitis: Secondary | ICD-10-CM | POA: Diagnosis not present

## 2018-01-31 DIAGNOSIS — R059 Cough, unspecified: Secondary | ICD-10-CM

## 2018-01-31 MED ORDER — BENZONATATE 100 MG PO CAPS: 100.0000 mg | ORAL_CAPSULE | Freq: Three times a day (TID) | ORAL | 0 refills | Status: DC | PRN

## 2018-01-31 MED ORDER — ALBUTEROL SULFATE (2.5 MG/3ML) 0.083% IN NEBU
2.5000 mg | INHALATION_SOLUTION | Freq: Once | RESPIRATORY_TRACT | Status: AC
  Administered 2018-01-31: 2.5 mg via RESPIRATORY_TRACT

## 2018-01-31 MED ORDER — ALBUTEROL SULFATE HFA 108 (90 BASE) MCG/ACT IN AERS: 2.0000 | INHALATION_SPRAY | RESPIRATORY_TRACT | 1 refills | Status: DC | PRN

## 2018-01-31 MED ORDER — NYSTATIN 100000 UNIT/ML MT SUSP: 5.0000 mL | Freq: Four times a day (QID) | OROMUCOSAL | 0 refills | Status: DC

## 2018-01-31 MED ORDER — AZITHROMYCIN 250 MG PO TABS: ORAL_TABLET | ORAL | 0 refills | Status: DC

## 2018-01-31 NOTE — Patient Instructions (Addendum)
Good to see you today  I think you have pneumonia and a yeast infection of your mouth  I have sent in several medications-  Azithromycin- an antibiotic- take 2 today then one a day until finished  Benzonate- this is a cough pill, take 1 to 2 every 6 to 8 hours for cough  Albuterol inhaler- this is to help your breathing, to help with cough and wheeze  Nystatin- this is to help with mouth pain, swish a teaspoon full 4 times a day, can either swallow the medicine or spit it out  If breathing gets worse, fever over 100 or severe pain or unable to take liquids- go to ER  I will notify you of chest xray results later today
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
## 2018-04-13 ENCOUNTER — Other Ambulatory Visit: Payer: Self-pay | Admitting: *Deleted

## 2018-04-13 DIAGNOSIS — R05 Cough: Secondary | ICD-10-CM

## 2018-04-13 DIAGNOSIS — R059 Cough, unspecified: Secondary | ICD-10-CM

## 2018-04-13 DIAGNOSIS — R062 Wheezing: Secondary | ICD-10-CM

## 2018-04-13 MED ORDER — ALBUTEROL SULFATE HFA 108 (90 BASE) MCG/ACT IN AERS: 2.0000 | INHALATION_SPRAY | RESPIRATORY_TRACT | 1 refills | Status: DC | PRN

## 2018-04-13 NOTE — Telephone Encounter (Signed)
Patient called requesting refills on all of her medications. After talking with patient she stated that she is just running low on the inhaler and her blood thinner. Patient stated that she is low on both of her medications and would like them sent to local pharmacy [place]. After talking with [name] appointment scheduled for 04/17/18 and patient verbalized understanding. Eliquis noted on 08/21/17 Albuterol  01/31/18 1 inhaler/1 refill Patient stated that she has had a lot of problems with [facility] and may start back getting her medications locally

## 2018-04-13 NOTE — Telephone Encounter (Signed)
Patient advised.

## 2018-04-13 NOTE — Telephone Encounter (Signed)
I sent the albuterol refill.  My understanding is that her eliquis rx was coming through cardiology and she should contact them about that refill.  I don't see in the EMR where we have ever filled her eliquis, so I didn't send it now.  Thanks.

## 2018-04-17 ENCOUNTER — Ambulatory Visit: Payer: Medicare HMO | Admitting: Family Medicine

## 2018-04-25 ENCOUNTER — Ambulatory Visit (INDEPENDENT_AMBULATORY_CARE_PROVIDER_SITE_OTHER): Payer: Medicare HMO | Admitting: Family Medicine

## 2018-04-25 ENCOUNTER — Encounter: Payer: Self-pay | Admitting: Family Medicine

## 2018-04-25 VITALS — BP 138/78 | HR 77 | Temp 98.3°F | Ht 65.0 in | Wt 211.2 lb

## 2018-04-25 DIAGNOSIS — R059 Cough, unspecified: Secondary | ICD-10-CM

## 2018-04-25 DIAGNOSIS — J209 Acute bronchitis, unspecified: Secondary | ICD-10-CM

## 2018-04-25 DIAGNOSIS — R05 Cough: Secondary | ICD-10-CM | POA: Diagnosis not present

## 2018-04-25 LAB — POC INFLUENZA A&B (BINAX/QUICKVUE)
INFLUENZA B, POC: NEGATIVE
Influenza A, POC: NEGATIVE

## 2018-04-25 MED ORDER — PREDNISONE 10 MG PO TABS: ORAL_TABLET | ORAL | 0 refills | Status: DC

## 2018-04-25 MED ORDER — BENZONATATE 100 MG PO CAPS: 100.0000 mg | ORAL_CAPSULE | Freq: Three times a day (TID) | ORAL | 1 refills | Status: DC | PRN

## 2018-04-25 MED ORDER — ALPRAZOLAM 0.5 MG PO TABS: ORAL_TABLET | ORAL | 0 refills | Status: DC

## 2018-04-25 NOTE — Patient Instructions (Signed)
Your flu test is negative today  I think you have bronchitis  Drink fluids and rest   Take the prednisone for wheezing as directed (this should help cough also) Continue your inhaler as needed  Tessalon for cough  Also mucinex DM may help cough - it is ok to take also   Update if not starting to improve in a week or if worsening
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
## 2018-04-25 NOTE — Assessment & Plan Note (Signed)
Started with fever that is gone now  Rapid flu test is negative but I cannot entirely r/o flu given her hx  Reassuring exam today-mild wheezing Px prednisone Also tessalon  sympt care discussed (mucinex dm)  Fluids/rest Update if not starting to improve in a week or if worsening

## 2018-05-09 DIAGNOSIS — I259 Chronic ischemic heart disease, unspecified: Secondary | ICD-10-CM | POA: Diagnosis not present

## 2018-05-09 DIAGNOSIS — I480 Paroxysmal atrial fibrillation: Secondary | ICD-10-CM | POA: Diagnosis not present

## 2018-05-09 DIAGNOSIS — I10 Essential (primary) hypertension: Secondary | ICD-10-CM | POA: Diagnosis not present

## 2018-05-09 DIAGNOSIS — R0789 Other chest pain: Secondary | ICD-10-CM | POA: Diagnosis not present

## 2018-05-09 DIAGNOSIS — I4891 Unspecified atrial fibrillation: Secondary | ICD-10-CM | POA: Diagnosis not present

## 2018-06-04 ENCOUNTER — Telehealth: Payer: Self-pay | Admitting: Family Medicine

## 2018-06-04 ENCOUNTER — Other Ambulatory Visit: Payer: Self-pay | Admitting: *Deleted

## 2018-06-04 MED ORDER — LANSOPRAZOLE 30 MG PO CPDR: 30.0000 mg | DELAYED_RELEASE_CAPSULE | Freq: Two times a day (BID) | ORAL | 1 refills | Status: DC

## 2018-06-04 NOTE — Telephone Encounter (Signed)
Patient requests RF:  Alprazolam Last office visit:   04/25/2018 Acute [facility] Last Filled:    30 tablet 0 04/25/2018  Please advise.

## 2018-06-04 NOTE — Telephone Encounter (Signed)
Pt need refill for    lansoprazole 30 mg  Alprazolam 5 mg    Sent to [facility]

## 2018-06-04 NOTE — Telephone Encounter (Signed)
RF request sent to [name] for approval.

## 2018-06-05 MED ORDER — ALPRAZOLAM 0.5 MG PO TABS: ORAL_TABLET | ORAL | 0 refills | Status: DC

## 2018-06-05 NOTE — Telephone Encounter (Signed)
Sent. Thanks.   

## 2018-08-25 ENCOUNTER — Other Ambulatory Visit: Payer: Self-pay | Admitting: Family Medicine

## 2018-08-27 NOTE — Telephone Encounter (Signed)
Electronic refill request. Alprazolam Last office visit:   04/25/2018 Acute  [facility] Last Filled:    30 tablet 0 06/05/2018  Please advise.

## 2018-08-28 NOTE — Telephone Encounter (Signed)
Sent. Thanks.   

## 2018-08-30 ENCOUNTER — Other Ambulatory Visit: Payer: Self-pay | Admitting: Family Medicine

## 2018-08-30 NOTE — Telephone Encounter (Signed)
Electronic refill request.  Last office visit:   04/25/2018 Acute Dr. Glori Bickers Last Filled:    30 tablet 0 08/28/2018   Please advise.

## 2018-09-01 NOTE — Telephone Encounter (Signed)
Was recently sent but by database wasn't filled.  What is the status on this?  Reasonable for 84min OV re: A fib and mood when possible, by phone if needed.

## 2018-09-03 NOTE — Telephone Encounter (Signed)
Scheduled appt on Thursday.  Patient says she has not picked up RF because pharmacy told her the refill had not been sent in.

## 2018-09-04 NOTE — Telephone Encounter (Signed)
I sent a refill again.  Thanks.

## 2018-09-06 ENCOUNTER — Ambulatory Visit (INDEPENDENT_AMBULATORY_CARE_PROVIDER_SITE_OTHER): Payer: Medicare HMO | Admitting: Family Medicine

## 2018-09-06 ENCOUNTER — Encounter: Payer: Self-pay | Admitting: Family Medicine

## 2018-09-06 ENCOUNTER — Other Ambulatory Visit: Payer: Self-pay

## 2018-09-06 VITALS — BP 132/80 | HR 72 | Temp 97.7°F | Ht 65.0 in | Wt 220.3 lb

## 2018-09-06 DIAGNOSIS — I4891 Unspecified atrial fibrillation: Secondary | ICD-10-CM

## 2018-09-06 DIAGNOSIS — F341 Dysthymic disorder: Secondary | ICD-10-CM | POA: Diagnosis not present

## 2018-09-06 DIAGNOSIS — M810 Age-related osteoporosis without current pathological fracture: Secondary | ICD-10-CM | POA: Diagnosis not present

## 2018-09-06 DIAGNOSIS — E785 Hyperlipidemia, unspecified: Secondary | ICD-10-CM

## 2018-09-06 LAB — COMPREHENSIVE METABOLIC PANEL
ALT: 9 U/L (ref 0–35)
AST: 15 U/L (ref 0–37)
Albumin: 4 g/dL (ref 3.5–5.2)
Alkaline Phosphatase: 87 U/L (ref 39–117)
BUN: 18 mg/dL (ref 6–23)
CO2: 29 mEq/L (ref 19–32)
Calcium: 8.7 mg/dL (ref 8.4–10.5)
Chloride: 103 mEq/L (ref 96–112)
Creatinine, Ser: 0.79 mg/dL (ref 0.40–1.20)
GFR: 84.59 mL/min (ref >60.00)
Glucose, Bld: 76 mg/dL (ref 70–99)
Potassium: 4.1 mEq/L (ref 3.5–5.1)
Sodium: 138 mEq/L (ref 135–145)
Total Bilirubin: 0.7 mg/dL (ref 0.2–1.2)
Total Protein: 7 g/dL (ref 6.0–8.3)

## 2018-09-06 LAB — CBC WITH DIFFERENTIAL/PLATELET
Basophils Absolute: 0 10*3/uL (ref 0.0–0.1)
Basophils Relative: 0.4 % (ref 0.0–3.0)
Eosinophils Absolute: 0.1 10*3/uL (ref 0.0–0.7)
Eosinophils Relative: 1 % (ref 0.0–5.0)
HCT: 36.8 % (ref 36.0–46.0)
Hemoglobin: 12.1 g/dL (ref 12.0–15.0)
Lymphocytes Relative: 30.7 % (ref 12.0–46.0)
Lymphs Abs: 2.3 10*3/uL (ref 0.7–4.0)
MCHC: 33 g/dL (ref 30.0–36.0)
MCV: 86.2 fl (ref 78.0–100.0)
Monocytes Absolute: 0.5 10*3/uL (ref 0.1–1.0)
Monocytes Relative: 6.3 % (ref 3.0–12.0)
Neutro Abs: 4.6 10*3/uL (ref 1.4–7.7)
Neutrophils Relative %: 61.6 % (ref 43.0–77.0)
Platelets: 219 10*3/uL (ref 150.0–400.0)
RBC: 4.26 Mil/uL (ref 3.87–5.11)
RDW: 14.3 % (ref 11.5–15.5)
WBC: 7.4 10*3/uL (ref 4.0–10.5)

## 2018-09-06 LAB — VITAMIN D 25 HYDROXY (VIT D DEFICIENCY, FRACTURES): VITD: 45.28 ng/mL (ref 30.00–100.00)

## 2018-09-06 LAB — LIPID PANEL
Cholesterol: 150 mg/dL (ref 0–200)
HDL: 52 mg/dL (ref >39.00)
LDL Cholesterol: 80 mg/dL (ref 0–99)
NonHDL: 97.82
Total CHOL/HDL Ratio: 3
Triglycerides: 87 mg/dL (ref 0.0–149.0)
VLDL: 17.4 mg/dL (ref 0.0–40.0)

## 2018-09-06 LAB — TSH: TSH: 1.54 u[IU]/mL (ref 0.35–4.50)

## 2018-09-06 NOTE — Progress Notes (Signed)
Pandemic considerations d/w pt.  She is going to the store episodically, stocking up as needed.  She has mask and gloves.  Still on 100mg  sertraline a day with some relief of anxiety.  No adverse effect on medication.  Compliant.  She is used Xanax as needed without adverse effect, when anxious.  AF.    No bleeding on anticoagulation.  No heart racing.  Using medication without problems or lightheadedness: yes Chest pain with exertion:no Edema:no Short of breath: only with sig exertion, yard work, that wasn't a dramatic change from her baseline.  "I can't work as well I used to" and some of that would be age expected.    Elevated Cholesterol: Using medications without problems:yes Muscle aches: no Diet compliance: yes Exercise:yes, as tolerated.    Meds, vitals, and allergies reviewed.   ROS: Per HPI unless specifically indicated in ROS section   GEN: nad, alert and oriented HEENT: mucous membranes moist NECK: supple w/o LA CV: sounds to be IRR, not tachy PULM: ctab, no inc wob ABD: soft, +bs EXT: no edema SKIN: well perfused.

## 2018-09-06 NOTE — Patient Instructions (Signed)
Go to the lab on the way out.  We'll contact you with your lab report. Update me as needed.  Take care.  Glad to see you.  I'll update the cardiology clinic.

## 2018-09-07 ENCOUNTER — Telehealth: Payer: Self-pay

## 2018-09-07 ENCOUNTER — Emergency Department
Admission: EM | Admit: 2018-09-07 | Discharge: 2018-09-07 | Disposition: A | Discharge status: Home / Self Care | Payer: Medicare HMO | Attending: Emergency Medicine | Admitting: Emergency Medicine

## 2018-09-07 ENCOUNTER — Other Ambulatory Visit: Payer: Self-pay

## 2018-09-07 ENCOUNTER — Emergency Department: Payer: Medicare HMO

## 2018-09-07 ENCOUNTER — Encounter: Payer: Self-pay | Admitting: Emergency Medicine

## 2018-09-07 DIAGNOSIS — J45909 Unspecified asthma, uncomplicated: Secondary | ICD-10-CM | POA: Insufficient documentation

## 2018-09-07 DIAGNOSIS — Z79899 Other long term (current) drug therapy: Secondary | ICD-10-CM | POA: Insufficient documentation

## 2018-09-07 DIAGNOSIS — I10 Essential (primary) hypertension: Secondary | ICD-10-CM | POA: Diagnosis not present

## 2018-09-07 DIAGNOSIS — Z7901 Long term (current) use of anticoagulants: Secondary | ICD-10-CM | POA: Insufficient documentation

## 2018-09-07 DIAGNOSIS — R220 Localized swelling, mass and lump, head: Secondary | ICD-10-CM | POA: Diagnosis not present

## 2018-09-07 DIAGNOSIS — R600 Localized edema: Secondary | ICD-10-CM | POA: Diagnosis present

## 2018-09-07 DIAGNOSIS — I2510 Atherosclerotic heart disease of native coronary artery without angina pectoris: Secondary | ICD-10-CM | POA: Diagnosis not present

## 2018-09-07 DIAGNOSIS — K1120 Sialoadenitis, unspecified: Secondary | ICD-10-CM | POA: Diagnosis not present

## 2018-09-07 LAB — BASIC METABOLIC PANEL
Anion gap: 8 (ref 5–15)
BUN: 16 mg/dL (ref 8–23)
CO2: 27 mmol/L (ref 22–32)
Calcium: 8.6 mg/dL — ABNORMAL LOW (ref 8.9–10.3)
Chloride: 106 mmol/L (ref 98–111)
Creatinine, Ser: 0.78 mg/dL (ref 0.44–1.00)
GFR calc Af Amer: >60 mL/min (ref >60)
GFR calc non Af Amer: >60 mL/min (ref >60)
Glucose, Bld: 103 mg/dL — ABNORMAL HIGH (ref 70–99)
Potassium: 3.9 mmol/L (ref 3.5–5.1)
Sodium: 141 mmol/L (ref 135–145)

## 2018-09-07 LAB — CBC WITH DIFFERENTIAL/PLATELET
Abs Immature Granulocytes: 0.02 10*3/uL (ref 0.00–0.07)
Basophils Absolute: 0 10*3/uL (ref 0.0–0.1)
Basophils Relative: 0 %
Eosinophils Absolute: 0.1 10*3/uL (ref 0.0–0.5)
Eosinophils Relative: 2 %
HCT: 34.9 % — ABNORMAL LOW (ref 36.0–46.0)
Hemoglobin: 11.3 g/dL — ABNORMAL LOW (ref 12.0–15.0)
Immature Granulocytes: 0 %
Lymphocytes Relative: 24 %
Lymphs Abs: 1.8 10*3/uL (ref 0.7–4.0)
MCH: 28.1 pg (ref 26.0–34.0)
MCHC: 32.4 g/dL (ref 30.0–36.0)
MCV: 86.8 fL (ref 80.0–100.0)
Monocytes Absolute: 0.5 10*3/uL (ref 0.1–1.0)
Monocytes Relative: 6 %
Neutro Abs: 5 10*3/uL (ref 1.7–7.7)
Neutrophils Relative %: 68 %
Platelets: 205 10*3/uL (ref 150–400)
RBC: 4.02 MIL/uL (ref 3.87–5.11)
RDW: 13.5 % (ref 11.5–15.5)
WBC: 7.4 10*3/uL (ref 4.0–10.5)
nRBC: 0 % (ref 0.0–0.2)

## 2018-09-07 MED ORDER — CLINDAMYCIN PHOSPHATE 300 MG/50ML IV SOLN
300.0000 mg | Freq: Once | INTRAVENOUS | Status: AC
  Administered 2018-09-07: 300 mg via INTRAVENOUS
  Filled 2018-09-07: qty 50

## 2018-09-07 MED ORDER — ALBUTEROL SULFATE (2.5 MG/3ML) 0.083% IN NEBU
2.5000 mg | INHALATION_SOLUTION | Freq: Once | RESPIRATORY_TRACT | Status: AC
  Administered 2018-09-07: 18:00:00 2.5 mg via RESPIRATORY_TRACT
  Filled 2018-09-07: qty 3

## 2018-09-07 MED ORDER — IOHEXOL 300 MG/ML  SOLN
75.0000 mL | Freq: Once | INTRAMUSCULAR | Status: AC | PRN
  Administered 2018-09-07: 75 mL via INTRAVENOUS
  Filled 2018-09-07: qty 75

## 2018-09-07 MED ORDER — METRONIDAZOLE IN NACL 5-0.79 MG/ML-% IV SOLN
500.0000 mg | Freq: Once | INTRAVENOUS | Status: AC
  Administered 2018-09-07: 500 mg via INTRAVENOUS
  Filled 2018-09-07: qty 100

## 2018-09-07 MED ORDER — CLINDAMYCIN HCL 300 MG PO CAPS: 300.0000 mg | ORAL_CAPSULE | Freq: Three times a day (TID) | ORAL | 0 refills | Status: AC

## 2018-09-07 MED ORDER — METRONIDAZOLE 250 MG PO TABS: 250.0000 mg | ORAL_TABLET | Freq: Three times a day (TID) | ORAL | 0 refills | Status: AC

## 2018-09-07 NOTE — ED Triage Notes (Signed)
Left jaw swelling since this morning.   Also c/o left ear pain.

## 2018-09-07 NOTE — Discharge Instructions (Signed)
Follow discharge care instruction take medication as directed.  Advised ibuprofen as needed for pain.  Be sure to call ENT clinic Monday morning to let you follow-up from emergency room so they can reevaluate you.

## 2018-09-07 NOTE — ED Notes (Addendum)
Pt from home with left sided facial swelling and jaw pain since this morning. Pt states she has a "sour taste" in her mouth as well, discovered when she was eating apple pie. She states her left side was first, and then the right; then her her left ear began to hurt. Pt alert & oriented, NAD noted.

## 2018-09-07 NOTE — Telephone Encounter (Signed)
Pt said this morning when pt woke up lt side of jaw was swollen like a large apple from behind the ear to the middle of pts throat. Pt said at lunch pt ate roast and apple pie and had problems swallowing; no problems breathing.pt said "it hurts real bad". Pt does not have teeth but wears dentures. No fever, chills, S/T, cough, SOB,muscle pain,H/A, diarrhea and no loss of taste/smell. No travel and no known exposure to covid. Pt said no to UC or ED. I spoke with Dr Damita Dunnings and pt needs to go to ED. Pt voiced understanding and there is someone there now to take her to [facility] ED. Pt agreed to go to ED. FYI to Dr Damita Dunnings.
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
## 2018-09-08 NOTE — Telephone Encounter (Signed)
Thank you.  Agree.  She went to ER.

## 2018-09-09 MED ORDER — ATORVASTATIN CALCIUM 80 MG PO TABS: 80.0000 mg | ORAL_TABLET | Freq: Every day | ORAL | 3 refills | Status: DC

## 2018-09-09 MED ORDER — SERTRALINE HCL 100 MG PO TABS: 100.0000 mg | ORAL_TABLET | Freq: Every day | ORAL | 3 refills | Status: DC

## 2018-09-09 MED ORDER — LANSOPRAZOLE 30 MG PO CPDR: 30.0000 mg | DELAYED_RELEASE_CAPSULE | Freq: Two times a day (BID) | ORAL | 3 refills | Status: DC

## 2018-09-09 MED ORDER — DILTIAZEM HCL ER BEADS 240 MG PO CP24: 240.0000 mg | ORAL_CAPSULE | Freq: Every day | ORAL | 3 refills | Status: DC

## 2018-09-09 NOTE — Assessment & Plan Note (Signed)
Continue SSRI.  Continue as is.  Update me as needed.  She agrees.  Okay for outpatient follow-up.

## 2018-09-09 NOTE — Assessment & Plan Note (Signed)
Continue statin.  See notes on labs.  No adverse effect on medication.

## 2018-09-09 NOTE — Assessment & Plan Note (Addendum)
No change in meds at this point.  Continue as is.  See notes on labs.  We will route a copy of her labs over to cardiology as FYI.  She has tolerated anticoagulation.  She is not short of breath unless she has significant exertion with yard work and this is not a dramatic change from her baseline.  I think some of this is due to relative deconditioning/aging given that she is 83 years old.  Discussed.  >25 minutes spent in face to face time with patient, >50% spent in counselling or coordination of care.

## 2018-09-24 ENCOUNTER — Encounter: Payer: Self-pay | Admitting: Family Medicine

## 2018-09-24 ENCOUNTER — Ambulatory Visit: Payer: Medicare HMO | Admitting: Family Medicine

## 2018-09-24 ENCOUNTER — Ambulatory Visit (INDEPENDENT_AMBULATORY_CARE_PROVIDER_SITE_OTHER): Payer: Medicare HMO | Admitting: Family Medicine

## 2018-09-24 ENCOUNTER — Other Ambulatory Visit: Payer: Self-pay

## 2018-09-24 VITALS — BP 158/74 | HR 72 | Temp 98.0°F | Resp 24 | Ht 65.0 in | Wt 217.5 lb

## 2018-09-24 DIAGNOSIS — K1121 Acute sialoadenitis: Secondary | ICD-10-CM | POA: Insufficient documentation

## 2018-09-24 DIAGNOSIS — M1990 Unspecified osteoarthritis, unspecified site: Secondary | ICD-10-CM | POA: Diagnosis not present

## 2018-09-24 NOTE — Assessment & Plan Note (Signed)
Seen in ER and with pain improved but still noticing swelling. Given swelling and initial ER recommendation to see ENT will place referral for her to be evaluated and imaging reviewed. Do not feel that further abx are needed now as pain is improving, but will defer to ENT

## 2018-09-24 NOTE — Assessment & Plan Note (Signed)
Given hx suspect thumb pain is arthritis in nature. Pt noted topical treatment given by daughter seems to help. Decided to continue this. Advised bringing to next visit with pcp so we can update the chart. If not improvement may need to consider de quervains as exam difficult to determine if MCP arthritis vs de quervains on exam

## 2018-09-24 NOTE — Patient Instructions (Signed)
#   Jaw Swelling - see the specialist  - complete antibiotic   # Hand pain - likely arthritis - use the topical treatment that your daughter has

## 2018-09-24 NOTE — Progress Notes (Signed)
Subjective:     [name] is a 83 y.o. female presenting for Oral Pain (since 09/07/2018. Still hurting. Swelling better but still present on the right side of the mouth/jaw) and Hand Pain (right side. Near thumb area. x over a month.)     HPI  #Parotitis - was put on the abx - still has 1 pill left - but still having pain - mouth is still in pain - worse over the parotid glands and with opening the mouth - missed some doses of the medications - having a lot of bowel changes - will get occasionally swelling at night - pain has resolved  #Hand pain - both sides - hx of surgery on both sides - right hand is so painful it is difficult to even wash her hands - known hx of arthritis - pain located at the thumb - hx of wrist fracture which required a repair - medications: ibuprofen yesterday which helped some   Review of Systems  Constitutional: Negative for chills and fever.  HENT: Negative for sore throat and trouble swallowing.     09/07/2018: ER - CT scan with acute left parotitis. IV abx to oral. Advised ENT f/u. Treated with clinda and flagyl  Social History   Tobacco Use  Smoking Status Never Smoker  Smokeless Tobacco Never Used        Objective:    BP Readings from Last 3 Encounters:  09/24/18 (!) 158/74  09/07/18 (!) 185/79  09/06/18 132/80   Wt Readings from Last 3 Encounters:  09/24/18 217 lb 8 oz (98.7 kg)  09/07/18 220 lb 3.8 oz (99.9 kg)  09/06/18 220 lb 5 oz (99.9 kg)    BP (!) 158/74   Pulse 72   Temp 98 F (36.7 C)   Resp (!) 24   Ht 5\' 5"  (1.651 m)   Wt 217 lb 8 oz (98.7 kg)   BMI 36.19 kg/m    Physical Exam Constitutional:      General: She is not in acute distress.    Appearance: She is well-developed. She is not diaphoretic.  HENT:     Right Ear: External ear normal.     Left Ear: External ear normal.     Nose: Nose normal.     Mouth/Throat:     Mouth: Mucous membranes are moist. No oral lesions.     Dentition: Has  dentures.     Tongue: No lesions.     Palate: No mass and lesions.     Pharynx: No posterior oropharyngeal erythema.     Comments: Unable to appreciate swelling Eyes:     Conjunctiva/sclera: Conjunctivae normal.  Neck:     Musculoskeletal: Neck supple. No muscular tenderness.  Cardiovascular:     Rate and Rhythm: Normal rate.  Pulmonary:     Effort: Pulmonary effort is normal.  Skin:    General: Skin is warm and dry.     Capillary Refill: Capillary refill takes less than 2 seconds.  Neurological:     Mental Status: She is alert. Mental status is at baseline.  Psychiatric:        Mood and Affect: Mood normal.        Behavior: Behavior normal.           Assessment & Plan:   Problem List Items Addressed This Visit      Digestive   Acute parotitis - Primary    Seen in ER and with pain improved but still noticing swelling.  Given swelling and initial ER recommendation to see ENT will place referral for her to be evaluated and imaging reviewed. Do not feel that further abx are needed now as pain is improving, but will defer to ENT      Relevant Orders   Ambulatory referral to ENT     Musculoskeletal and Integument   Arthritis    Given hx suspect thumb pain is arthritis in nature. Pt noted topical treatment given by daughter seems to help. Decided to continue this. Advised bringing to next visit with pcp so we can update the chart. If not improvement may need to consider de quervains as exam difficult to determine if MCP arthritis vs de quervains on exam          Return if symptoms worsen or fail to improve.  Lesleigh Noe, MD

## 2018-09-27 DIAGNOSIS — K1120 Sialoadenitis, unspecified: Secondary | ICD-10-CM | POA: Diagnosis not present

## 2018-09-27 DIAGNOSIS — R221 Localized swelling, mass and lump, neck: Secondary | ICD-10-CM | POA: Diagnosis not present

## 2018-10-04 ENCOUNTER — Other Ambulatory Visit: Payer: Self-pay | Admitting: Family Medicine

## 2018-10-05 NOTE — Telephone Encounter (Signed)
Electronic refill request. Cyclobenzaprine Last office visit:   09/24/2018 Acute - E[facility] Last Filled:    90 tablet 0 01/02/2018  Please advise.

## 2018-10-07 NOTE — Telephone Encounter (Signed)
Sent. Thanks.   

## 2018-10-29 ENCOUNTER — Encounter: Payer: Self-pay | Admitting: Internal Medicine

## 2018-10-29 ENCOUNTER — Ambulatory Visit (INDEPENDENT_AMBULATORY_CARE_PROVIDER_SITE_OTHER): Payer: Medicare HMO | Admitting: Internal Medicine

## 2018-10-29 DIAGNOSIS — R21 Rash and other nonspecific skin eruption: Secondary | ICD-10-CM | POA: Diagnosis not present

## 2018-10-29 MED ORDER — PREDNISONE 20 MG PO TABS: 40.0000 mg | ORAL_TABLET | Freq: Every day | ORAL | 0 refills | Status: DC

## 2018-10-29 NOTE — Assessment & Plan Note (Signed)
Hard to tell over the video Fairly widespread rash and very pruritic--so likely contact derm It could be pityriasis rosea too--but unlikely  Discussed the time course if it is PR Also recommended cetirizine for the itching--instead of benedryl
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
## 2018-11-06 ENCOUNTER — Ambulatory Visit: Payer: Medicare HMO | Admitting: Family Medicine

## 2018-11-07 ENCOUNTER — Encounter: Payer: Self-pay | Admitting: Family Medicine

## 2018-11-07 ENCOUNTER — Other Ambulatory Visit: Payer: Self-pay

## 2018-11-07 ENCOUNTER — Ambulatory Visit (INDEPENDENT_AMBULATORY_CARE_PROVIDER_SITE_OTHER): Payer: Medicare HMO | Admitting: Family Medicine

## 2018-11-07 VITALS — BP 136/82 | HR 80 | Temp 98.0°F | Ht 65.0 in | Wt 213.6 lb

## 2018-11-07 DIAGNOSIS — R21 Rash and other nonspecific skin eruption: Secondary | ICD-10-CM

## 2018-11-07 MED ORDER — ACYCLOVIR 400 MG PO TABS: 400.0000 mg | ORAL_TABLET | Freq: Four times a day (QID) | ORAL | 0 refills | Status: DC

## 2018-11-07 NOTE — Assessment & Plan Note (Signed)
Little to no improvement with prednisone Disc poss of pityriasis rosea (given the area on back resembling herald patch) tx with acyclovir for 1 week Also inst to stop all scented products (see avs) and stay cool  Some indirect sun exp may also help Zyrtec 10 mg qhs prn itch inst to please stop picking and scratching the rash  Update if symptoms worsen If no improvement in a week will ref to dermatology

## 2018-11-07 NOTE — Patient Instructions (Addendum)
You may have a viral rash called pityriasis rosea  I sent a px for acyclovir to try and see if it helps  Also - get zyrtec (not the D but just plain zyrtec) 10 mg each bedtime for itch Stay as cool as you can   Take the acyclovir as directed  Use unscented products Use a detergent with the word "free" in it (like tide free)  Switch to plain dove soap - for sensitive skin  Use moisturizers with no smell  Avoid perfume  Avoid dryer sheets   Let us know if anyone else gets the rash or if you see insects in the house   If not improved in a week - we will refer you to a dermatologist (skin specialist)- let us know how you are doing   Try hard not to scratch or pick at your skin-this will make itching much worse
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
## 2018-12-25 ENCOUNTER — Telehealth: Payer: Self-pay | Admitting: Family Medicine

## 2018-12-25 ENCOUNTER — Other Ambulatory Visit: Payer: Self-pay

## 2018-12-25 NOTE — Telephone Encounter (Signed)
Xanax 0.5mg  last filled 09/04/2018 #30 with 1 refill... last OV acute with Dr Glori Bickers. F/U with you 08/2018, upcoming OV 12/27/2018.Marland KitchenMarland KitchenMarland Kitchen please advise

## 2018-12-25 NOTE — Telephone Encounter (Signed)
Patient is requesting a refill  Xanax  Patient has 1 tablet left  [name]

## 2018-12-26 MED ORDER — ALPRAZOLAM 0.5 MG PO TABS: 0.5000 mg | ORAL_TABLET | Freq: Every day | ORAL | 1 refills | Status: DC | PRN

## 2018-12-26 NOTE — Telephone Encounter (Signed)
Sent. Thanks.   

## 2018-12-27 ENCOUNTER — Ambulatory Visit: Payer: Medicare HMO | Admitting: Family Medicine

## 2018-12-28 ENCOUNTER — Encounter: Payer: Self-pay | Admitting: Family Medicine

## 2018-12-28 ENCOUNTER — Emergency Department: Payer: Medicare HMO

## 2018-12-28 ENCOUNTER — Ambulatory Visit (INDEPENDENT_AMBULATORY_CARE_PROVIDER_SITE_OTHER): Payer: Medicare HMO | Admitting: Family Medicine

## 2018-12-28 ENCOUNTER — Other Ambulatory Visit: Payer: Self-pay

## 2018-12-28 ENCOUNTER — Emergency Department
Admission: EM | Admit: 2018-12-28 | Discharge: 2018-12-28 | Disposition: A | Discharge status: Home / Self Care | Payer: Medicare HMO | Attending: Emergency Medicine | Admitting: Emergency Medicine

## 2018-12-28 VITALS — BP 152/118 | HR 142 | Temp 97.8°F | Resp 18 | Ht 65.0 in | Wt 213.4 lb

## 2018-12-28 DIAGNOSIS — I491 Atrial premature depolarization: Secondary | ICD-10-CM | POA: Diagnosis not present

## 2018-12-28 DIAGNOSIS — I10 Essential (primary) hypertension: Secondary | ICD-10-CM | POA: Diagnosis not present

## 2018-12-28 DIAGNOSIS — R0789 Other chest pain: Secondary | ICD-10-CM | POA: Diagnosis not present

## 2018-12-28 DIAGNOSIS — F341 Dysthymic disorder: Secondary | ICD-10-CM | POA: Diagnosis not present

## 2018-12-28 DIAGNOSIS — Z96653 Presence of artificial knee joint, bilateral: Secondary | ICD-10-CM | POA: Insufficient documentation

## 2018-12-28 DIAGNOSIS — R079 Chest pain, unspecified: Secondary | ICD-10-CM | POA: Diagnosis not present

## 2018-12-28 DIAGNOSIS — I4891 Unspecified atrial fibrillation: Secondary | ICD-10-CM

## 2018-12-28 DIAGNOSIS — I471 Supraventricular tachycardia: Secondary | ICD-10-CM

## 2018-12-28 DIAGNOSIS — R000 Tachycardia, unspecified: Secondary | ICD-10-CM | POA: Diagnosis present

## 2018-12-28 DIAGNOSIS — Z79899 Other long term (current) drug therapy: Secondary | ICD-10-CM | POA: Insufficient documentation

## 2018-12-28 DIAGNOSIS — R0902 Hypoxemia: Secondary | ICD-10-CM | POA: Diagnosis not present

## 2018-12-28 DIAGNOSIS — Z955 Presence of coronary angioplasty implant and graft: Secondary | ICD-10-CM | POA: Diagnosis not present

## 2018-12-28 DIAGNOSIS — Z7901 Long term (current) use of anticoagulants: Secondary | ICD-10-CM | POA: Insufficient documentation

## 2018-12-28 LAB — COMPREHENSIVE METABOLIC PANEL
ALT: 16 U/L (ref 0–44)
AST: 21 U/L (ref 15–41)
Albumin: 4 g/dL (ref 3.5–5.0)
Alkaline Phosphatase: 103 U/L (ref 38–126)
Anion gap: 10 (ref 5–15)
BUN: 12 mg/dL (ref 8–23)
CO2: 27 mmol/L (ref 22–32)
Calcium: 9.1 mg/dL (ref 8.9–10.3)
Chloride: 102 mmol/L (ref 98–111)
Creatinine, Ser: 0.92 mg/dL (ref 0.44–1.00)
GFR calc Af Amer: >60 mL/min (ref >60)
GFR calc non Af Amer: 59 mL/min — ABNORMAL LOW (ref >60)
Glucose, Bld: 101 mg/dL — ABNORMAL HIGH (ref 70–99)
Potassium: 3.8 mmol/L (ref 3.5–5.1)
Sodium: 139 mmol/L (ref 135–145)
Total Bilirubin: 0.9 mg/dL (ref 0.3–1.2)
Total Protein: 8.1 g/dL (ref 6.5–8.1)

## 2018-12-28 LAB — URINALYSIS, COMPLETE (UACMP) WITH MICROSCOPIC
Bacteria, UA: NONE SEEN
Bilirubin Urine: NEGATIVE
Glucose, UA: NEGATIVE mg/dL
Hgb urine dipstick: NEGATIVE
Ketones, ur: NEGATIVE mg/dL
Leukocytes,Ua: NEGATIVE
Nitrite: NEGATIVE
Protein, ur: NEGATIVE mg/dL
Specific Gravity, Urine: 1.006 (ref 1.005–1.030)
pH: 7 (ref 5.0–8.0)

## 2018-12-28 LAB — CBC WITH DIFFERENTIAL/PLATELET
Abs Immature Granulocytes: 0.02 10*3/uL (ref 0.00–0.07)
Basophils Absolute: 0 10*3/uL (ref 0.0–0.1)
Basophils Relative: 0 %
Eosinophils Absolute: 0.1 10*3/uL (ref 0.0–0.5)
Eosinophils Relative: 1 %
HCT: 40.6 % (ref 36.0–46.0)
Hemoglobin: 13.4 g/dL (ref 12.0–15.0)
Immature Granulocytes: 0 %
Lymphocytes Relative: 26 %
Lymphs Abs: 2.2 10*3/uL (ref 0.7–4.0)
MCH: 28.2 pg (ref 26.0–34.0)
MCHC: 33 g/dL (ref 30.0–36.0)
MCV: 85.3 fL (ref 80.0–100.0)
Monocytes Absolute: 0.5 10*3/uL (ref 0.1–1.0)
Monocytes Relative: 6 %
Neutro Abs: 5.5 10*3/uL (ref 1.7–7.7)
Neutrophils Relative %: 67 %
Platelets: 187 10*3/uL (ref 150–400)
RBC: 4.76 MIL/uL (ref 3.87–5.11)
RDW: 13.5 % (ref 11.5–15.5)
WBC: 8.2 10*3/uL (ref 4.0–10.5)
nRBC: 0 % (ref 0.0–0.2)

## 2018-12-28 LAB — TROPONIN I (HIGH SENSITIVITY)
Troponin I (High Sensitivity): 9 ng/L (ref <18)
Troponin I (High Sensitivity): 10 ng/L (ref <18)

## 2018-12-28 LAB — MAGNESIUM: Magnesium: 2.1 mg/dL (ref 1.7–2.4)

## 2018-12-28 MED ORDER — LABETALOL HCL 5 MG/ML IV SOLN
INTRAVENOUS | Status: AC
  Filled 2018-12-28: qty 4

## 2018-12-28 MED ORDER — LABETALOL HCL 5 MG/ML IV SOLN
10.0000 mg | Freq: Once | INTRAVENOUS | Status: AC
  Administered 2018-12-28: 10 mg via INTRAVENOUS
  Filled 2018-12-28: qty 4

## 2018-12-28 MED ORDER — METOPROLOL TARTRATE 25 MG PO TABS: 25.0000 mg | ORAL_TABLET | Freq: Once | ORAL | Status: DC

## 2018-12-28 MED ORDER — DILTIAZEM HCL 25 MG/5ML IV SOLN
10.0000 mg | Freq: Once | INTRAVENOUS | Status: AC
  Administered 2018-12-28: 10 mg via INTRAVENOUS
  Filled 2018-12-28: qty 5

## 2018-12-28 MED ORDER — METOPROLOL TARTRATE 25 MG PO TABS: 25.0000 mg | ORAL_TABLET | Freq: Two times a day (BID) | ORAL | 0 refills | Status: DC

## 2018-12-28 MED ORDER — DILTIAZEM HCL ER COATED BEADS 240 MG PO CP24
240.0000 mg | ORAL_CAPSULE | Freq: Once | ORAL | Status: AC
  Administered 2018-12-28: 240 mg via ORAL
  Filled 2018-12-28 (×2): qty 1

## 2018-12-28 NOTE — Discharge Instructions (Addendum)
I discussed your visit with Dr. Ubaldo Glassing.  Your symptoms today were due to increasing weakness from high heart rate. Dr. Ubaldo Glassing has recommended starting you on a new medication, METOPROLOL 25 mg.   You were given your DILTIAZEM tablet in the ER. You should not take this until tomorrow morning.  Start the metoprolol in the MORNING.   Monitor your heart rate as this medicine can cause it to go lower. If your heart rate is less than 65 in the morning, do not take the metoprolol and call Dr. Ubaldo Glassing.
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
## 2018-12-28 NOTE — ED Triage Notes (Addendum)
From drs office by [facility], reg checkup hr in 150s, SVT in 160s no complaints, no dizziness EMS a little hypertensive (191/113) 6 of adenosine, low 90s now back into sinus, few PVCs eloquis family member states she was complaining of chest pain yesterday 100% on RA

## 2018-12-28 NOTE — Progress Notes (Signed)
"  My breathing has been different" for the last few months per patient report.  She has had some occ "breast pain" in the last few days but she couldn't recall which side.   No CP now.  Still on diltiazem at baseline along with eliquis.  She administer medication today but otherwise has been compliant.  She was coming in for a routine office visit.  We had previously sent a prescription for Xanax to the pharmacy.  We talked about her situation.  She wanted it sent locally, not to mail order.  The above history was collected while I was checking the patient.  I was notified by the nursing staff on intake that the patient had elevated pulse rate.  As soon as she was roomed and her vitals were noted I was notified.  I checked the patient and verified that she did have an elevated pulse rate and given her history she was likely going to need EMS transport to the hospital.  Her daughter was in the car outside the clinic.  We sent staff to go get her daughter and I placed the patient on 2 L of oxygen just on principle, even though she was not hypoxic.  EMS was notified quickly in the meantime.  We talked about her situation and the rationale for emergency room evaluation.  She agreed and consented to EMS transport.  Paramedics arrived and the patient was transported to ER.  Meds, vitals, and allergies reviewed.   ROS: Per HPI unless specifically indicated in ROS section   GEN: nad, alert and oriented HEENT: ncat NECK: supple w/o LA CV: tachy, up to 150s.  PULM: ctab, no inc wob ABD: soft, +bs EXT: no edema SKIN: no acute rash

## 2018-12-30 MED ORDER — ALPRAZOLAM 0.5 MG PO TABS: 0.5000 mg | ORAL_TABLET | Freq: Every day | ORAL | 1 refills | Status: DC | PRN

## 2018-12-30 NOTE — Assessment & Plan Note (Signed)
Xanax prescription sent locally.  See orders.

## 2018-12-30 NOTE — Assessment & Plan Note (Signed)
History of atrial fibrillation noted, tachycardia noted today.  She is not sounded regular on exam.  She is already anticoagulated.  EMS called as above and patient was transported to hospital.  See ER note. >15 minutes spent in face to face time with patient, >50% spent in counselling or coordination of care

## 2018-12-31 ENCOUNTER — Telehealth: Payer: Self-pay | Admitting: Family Medicine

## 2018-12-31 ENCOUNTER — Telehealth: Payer: Self-pay | Admitting: *Deleted

## 2018-12-31 NOTE — Telephone Encounter (Signed)
Called patient back and was advised that she took a tylenol and her head is feeling some better now. Patient stated that she is unable to get her blood pressure kit to work, so she is going to call her daughter to come and help her work it. Patient stated that she will call back with her blood pressure and heart rate readings after she gets help from her daughter.

## 2018-12-31 NOTE — Telephone Encounter (Signed)
Noted. Thanks. Agreed.  

## 2018-12-31 NOTE — Telephone Encounter (Signed)
Patient called stating that she has a terrible headache that started last night and does not feel right. Patient stated that something is wrong with her. Patient stated that she does not have any chest pain, no SOB or difficulty breathing. Patient stated that she does not have body aches just feels bad all over. Patient stated that she has a blood pressure kit, but can not find it. Patient is going to call her daughter or granddaughter and have them come over to help her find it. Patient stated that she has been to the bathroom 8 times this morning to urinate. Advised patient that I will call her back shortly to see if she has been able to find her BP kit.

## 2018-12-31 NOTE — Telephone Encounter (Signed)
Patient called back stating that her blood pressure is 141/98, heart rate 76. Patient stated that she just ate something and now she feels nauseated. Patient is wondering if she should go back to the hospital?

## 2018-12-31 NOTE — Telephone Encounter (Signed)
Called patient back after talking with [name]. Patient stated that she now thinks that she does not need to go back to the hospital. Patient stated that now she does not feel nauseated. Patient stated that she thinks that she just waited too long to eat. Patient stated that she will go be tested for covid either today or in the morning and it depends on what time her family gets there. Patient given ER precautions. Patient stated that she does not feel like she did Friday when she went to the hospital.

## 2018-12-31 NOTE — Telephone Encounter (Signed)
Opened in error

## 2018-12-31 NOTE — Telephone Encounter (Signed)
Patient called back and stated that her temperature is 96.7. Patient stated that she is waiting for her family member to come and help her find her blood pressure kit. Patient stated that she will call back with this information when she gets it.

## 2019-01-01 ENCOUNTER — Other Ambulatory Visit: Payer: Self-pay | Admitting: *Deleted

## 2019-01-01 DIAGNOSIS — Z20822 Contact with and (suspected) exposure to covid-19: Secondary | ICD-10-CM

## 2019-01-01 DIAGNOSIS — Z20828 Contact with and (suspected) exposure to other viral communicable diseases: Secondary | ICD-10-CM | POA: Diagnosis not present

## 2019-01-02 LAB — NOVEL CORONAVIRUS, NAA: SARS-CoV-2, NAA: NOT DETECTED

## 2019-01-04 NOTE — Telephone Encounter (Signed)
Patient called back to let Dr.Duncan know her Covid test came back negative.  Patient said since she started her new blood pressure medication she's been feeling much better.

## 2019-01-06 NOTE — Telephone Encounter (Signed)
Noted.  I am glad she is feeling better.  Please make sure she follows up with cardiology.  Thanks.

## 2019-01-07 NOTE — Telephone Encounter (Signed)
Left detailed message on voicemail. DPR 

## 2019-01-16 DIAGNOSIS — I259 Chronic ischemic heart disease, unspecified: Secondary | ICD-10-CM | POA: Diagnosis not present

## 2019-01-16 DIAGNOSIS — I10 Essential (primary) hypertension: Secondary | ICD-10-CM | POA: Diagnosis not present

## 2019-01-16 DIAGNOSIS — I480 Paroxysmal atrial fibrillation: Secondary | ICD-10-CM | POA: Diagnosis not present

## 2019-01-28 ENCOUNTER — Other Ambulatory Visit: Payer: Self-pay | Admitting: Family Medicine

## 2019-01-28 DIAGNOSIS — R05 Cough: Secondary | ICD-10-CM

## 2019-01-28 DIAGNOSIS — R059 Cough, unspecified: Secondary | ICD-10-CM

## 2019-01-28 DIAGNOSIS — R062 Wheezing: Secondary | ICD-10-CM

## 2019-02-13 DIAGNOSIS — I259 Chronic ischemic heart disease, unspecified: Secondary | ICD-10-CM | POA: Diagnosis not present

## 2019-02-13 DIAGNOSIS — R001 Bradycardia, unspecified: Secondary | ICD-10-CM | POA: Diagnosis not present

## 2019-02-13 DIAGNOSIS — E782 Mixed hyperlipidemia: Secondary | ICD-10-CM | POA: Diagnosis not present

## 2019-02-13 DIAGNOSIS — I480 Paroxysmal atrial fibrillation: Secondary | ICD-10-CM | POA: Diagnosis not present

## 2019-02-13 DIAGNOSIS — I10 Essential (primary) hypertension: Secondary | ICD-10-CM | POA: Diagnosis not present

## 2019-03-18 ENCOUNTER — Other Ambulatory Visit: Payer: Self-pay | Admitting: *Deleted

## 2019-03-18 ENCOUNTER — Encounter: Payer: Self-pay | Admitting: Family Medicine

## 2019-03-18 ENCOUNTER — Other Ambulatory Visit: Payer: Self-pay

## 2019-03-18 ENCOUNTER — Ambulatory Visit (INDEPENDENT_AMBULATORY_CARE_PROVIDER_SITE_OTHER): Payer: Medicare HMO | Admitting: Family Medicine

## 2019-03-18 VITALS — BP 130/68 | HR 57 | Temp 97.1°F | Ht 65.0 in | Wt 214.1 lb

## 2019-03-18 DIAGNOSIS — I4891 Unspecified atrial fibrillation: Secondary | ICD-10-CM | POA: Diagnosis not present

## 2019-03-18 DIAGNOSIS — R05 Cough: Secondary | ICD-10-CM

## 2019-03-18 DIAGNOSIS — F341 Dysthymic disorder: Secondary | ICD-10-CM | POA: Diagnosis not present

## 2019-03-18 DIAGNOSIS — Z23 Encounter for immunization: Secondary | ICD-10-CM

## 2019-03-18 DIAGNOSIS — R059 Cough, unspecified: Secondary | ICD-10-CM

## 2019-03-18 MED ORDER — BENZONATATE 200 MG PO CAPS: 200.0000 mg | ORAL_CAPSULE | Freq: Three times a day (TID) | ORAL | 1 refills | Status: DC | PRN

## 2019-03-18 MED ORDER — FLUTICASONE PROPIONATE 50 MCG/ACT NA SUSP: 2.0000 | Freq: Every day | NASAL | 3 refills | Status: DC

## 2019-03-18 NOTE — Progress Notes (Signed)
This visit occurred during the SARS-CoV-2 public health emergency.  Safety protocols were in place, including screening questions prior to the visit, additional usage of staff PPE, and extensive cleaning of exam room while observing appropriate contact time as indicated for disinfecting solutions.  SABA use d/w pt.  Not used every day.  She has used about 3-4 times per week. She'll have a tickle in her throat, "like I need to cough".  No sputum.  No fevers.  She isn't SOB, her breathing "is good."  Not lightheaded, no CP.    AF. She feels better on 180 mg diltiazem.  Compliant.  Still anticoagulated.  No bleeding.  Still on statin, 80mg  lipitor.  No heart racing.    Anxiety.  She had a lot of family at her house prev but most have moved out now and this is easier for the patient.  Prn xanax use, still on sertraline.  Compliant with meds.  No ADE on med.    Flu shot done today.  PMH and SH reviewed  ROS: Per HPI unless specifically indicated in ROS section   Meds, vitals, and allergies reviewed.   GEN: nad, alert and oriented HEENT: mucous membranes moist, OP wnl NECK: supple w/o LA CV: sounds to be RRR PULM: ctab, no inc wob ABD: soft, +bs EXT: no edema SKIN: well perfused.

## 2019-03-18 NOTE — Patient Instructions (Signed)
Your lungs are clear.  Flu shot today.  Try using tessalon pills for the throat clearing instead of the albuterol inhaler.  See if that helps.  It not any better, then let me know.  If you need albuterol frequency (more than 4 times in a week), then let me know.  Take care.  Glad to see you.

## 2019-03-20 NOTE — Assessment & Plan Note (Signed)
She had a lot of family at her house prev but most have moved out now and this is easier for the patient.  Prn xanax use, still on sertraline.  Compliant with meds.  No ADE on med.  Safe at home.  Would continue current medications.  She agrees.    At least 30 minutes were devoted to patient care in this encounter (this can potentially include time spent reviewing the patient's file/history, interviewing and examining the patient, counseling/reviewing plan with patient, ordering referrals, ordering tests, reviewing relevant laboratory or x-ray data, and documenting the encounter).

## 2019-03-20 NOTE — Assessment & Plan Note (Signed)
She sounds to be in normal sinus rhythm today.  She is not tachycardic. She feels better on 180 mg diltiazem.  Compliant.  Still anticoagulated.  No bleeding.  Still on statin, 80mg  lipitor.  No heart racing.   Would continue as is.  She agrees.

## 2019-03-20 NOTE — Assessment & Plan Note (Signed)
Lungs clear.  She describes more of a tickle in her throat.  Reasonable to try Tessalon in the meantime.  If she is needing albuterol frequently then I want her to let me know.  She agrees.  Still okay for outpatient follow-up.

## 2019-04-11 ENCOUNTER — Encounter: Payer: Self-pay | Admitting: Family Medicine

## 2019-04-11 ENCOUNTER — Ambulatory Visit (INDEPENDENT_AMBULATORY_CARE_PROVIDER_SITE_OTHER): Payer: Medicare HMO | Admitting: Family Medicine

## 2019-04-11 ENCOUNTER — Telehealth: Payer: Self-pay | Admitting: Family Medicine

## 2019-04-11 VITALS — Ht 65.0 in | Wt 214.0 lb

## 2019-04-11 DIAGNOSIS — R102 Pelvic and perineal pain: Secondary | ICD-10-CM

## 2019-04-11 DIAGNOSIS — R350 Frequency of micturition: Secondary | ICD-10-CM | POA: Diagnosis not present

## 2019-04-11 MED ORDER — SULFAMETHOXAZOLE-TRIMETHOPRIM 800-160 MG PO TABS: 1.0000 | ORAL_TABLET | Freq: Two times a day (BID) | ORAL | 0 refills | Status: DC

## 2019-04-11 MED ORDER — APIXABAN 5 MG PO TABS: 5.0000 mg | ORAL_TABLET | Freq: Two times a day (BID) | ORAL | Status: DC

## 2019-04-11 NOTE — Progress Notes (Signed)
Interactive audio and video telecommunications were attempted between this provider and patient, however failed, due to patient having technical difficulties OR patient did not have access to video capability.  We continued and completed visit with audio only.   Virtual Visit via Telephone Note  I connected with patient on 04/11/19  at 5:10 PM  by telephone and verified that I am speaking with the correct person using two identifiers.  Location of patient: home  Location of MD: [place] Name of referring provider (if blank then none associated): Names per persons and role in encounter:  MD: Earlyne Iba, Patient: name listed above.    I discussed the limitations, risks, security and privacy concerns of performing an evaluation and management service by telephone and the availability of in person appointments. I also discussed with the patient that there may be a patient responsible charge related to this service. The patient expressed understanding and agreed to proceed.  CC: urinary sx.   History of Present Illness:  Fatigue and lower abd pain for about 5 days.  Dysuria with urinary frequency.  No fevers.  She has some lower back discomfort.  No burning with urination.  No blood in urine.  Normal BMs o/w.    She can collect a glass jar and collect a sterile sample.     Observations/Objective: nad Speech wnl  Assessment and Plan: Presumed UTI.  She'll start septra after collecting a urine sample at home tonight.  She'll bring urine to clinic for u/a and ucx.  She agrees.  Routine cautions d/w pt.  Still okay for outpatient f/u.   Follow Up Instructions: see above.    I discussed the assessment and treatment plan with the patient. The patient was provided an opportunity to ask questions and all were answered. The patient agreed with the plan and demonstrated an understanding of the instructions.   The patient was advised to call back or seek an in-person evaluation if the  symptoms worsen or if the condition fails to improve as anticipated.  I provided 15 minutes of non-face-to-face time during this encounter.  [name], MD

## 2019-04-11 NOTE — Telephone Encounter (Signed)
Pt having pain in her lower abdomen, urinary frequency (every 3mins). Pt states that she "feels clogged". Denies burning with urination. Pt feels that she is not able to void 100%.  Denies fever, nausea/vomiting.   Per Dr Damita Dunnings, we need a urine collected and brought in to test.  Phone visit today at 4:30pm If unable to bring a urine by today, will need to bring tomorrow morning first thing.   Spoke with patient, she is aware of recommendations. Will be available for the phone visit today at 430 Pt does not have anyone to bring urine by here today but she can collect one and store it in the fridge over night and have someone bring it in tomorrow.   Pt scheduled today at 4:30 with Dr Damita Dunnings for a Phone Visit.  Urinalysis/?Culture placed on lab schedule tomorrow.   Nothing further needed.

## 2019-04-12 ENCOUNTER — Other Ambulatory Visit (INDEPENDENT_AMBULATORY_CARE_PROVIDER_SITE_OTHER): Payer: Medicare HMO

## 2019-04-12 ENCOUNTER — Other Ambulatory Visit: Payer: Self-pay

## 2019-04-12 DIAGNOSIS — R350 Frequency of micturition: Secondary | ICD-10-CM

## 2019-04-12 LAB — POCT URINALYSIS DIPSTICK
Bilirubin, UA: NEGATIVE
Blood, UA: NEGATIVE
Glucose, UA: NEGATIVE
Ketones, UA: NEGATIVE
Leukocytes, UA: NEGATIVE
Nitrite, UA: NEGATIVE
Protein, UA: POSITIVE — AB
Spec Grav, UA: >=1.03 — AB (ref 1.010–1.025)
Urobilinogen, UA: 0.2 E.U./dL
pH, UA: 6 (ref 5.0–8.0)

## 2019-04-12 NOTE — Telephone Encounter (Signed)
See follow-up phone note. 

## 2019-04-13 LAB — URINE CULTURE
MICRO NUMBER:: 10096453
SPECIMEN QUALITY:: ADEQUATE

## 2019-04-14 DIAGNOSIS — R350 Frequency of micturition: Secondary | ICD-10-CM | POA: Insufficient documentation

## 2019-04-14 NOTE — Assessment & Plan Note (Signed)
Presumed UTI.  She'll start septra after collecting a urine sample at home tonight.  She'll bring urine to clinic for u/a and ucx.  She agrees.  Routine cautions d/w pt.  Still okay for outpatient f/u.

## 2019-05-08 ENCOUNTER — Telehealth: Payer: Self-pay

## 2019-05-08 NOTE — Telephone Encounter (Signed)
Patent notified as instructed by telephone and verbalized understanding. 

## 2019-05-08 NOTE — Telephone Encounter (Signed)
Yes I would get the vaccine.  Based on all available evidence, the benefit of the vaccine very much outweighs the risk.  Thanks.

## 2019-05-08 NOTE — Telephone Encounter (Signed)
Pt left v/m that her family wants her to have the covid vaccine which is scheduled for 05/09/19 at 11:40 AM.pt wants to make sure that it is OK with Dr Damita Dunnings if pt gets covid vaccine.Please advise.

## 2019-05-14 ENCOUNTER — Other Ambulatory Visit: Payer: Self-pay | Admitting: Family Medicine

## 2019-05-14 DIAGNOSIS — I10 Essential (primary) hypertension: Secondary | ICD-10-CM | POA: Diagnosis not present

## 2019-05-14 DIAGNOSIS — I4891 Unspecified atrial fibrillation: Secondary | ICD-10-CM | POA: Diagnosis not present

## 2019-05-14 DIAGNOSIS — I259 Chronic ischemic heart disease, unspecified: Secondary | ICD-10-CM | POA: Diagnosis not present

## 2019-05-14 DIAGNOSIS — I480 Paroxysmal atrial fibrillation: Secondary | ICD-10-CM | POA: Diagnosis not present

## 2019-05-14 NOTE — Telephone Encounter (Signed)
Electronic refill request. Alprazolam Last office visit:   04/11/2019 Last Filled:    30 tablet 1 12/30/2018   Please advise.

## 2019-05-15 NOTE — Telephone Encounter (Signed)
Sent. Thanks.   

## 2019-05-30 ENCOUNTER — Ambulatory Visit: Payer: Medicare HMO

## 2019-07-10 ENCOUNTER — Telehealth (INDEPENDENT_AMBULATORY_CARE_PROVIDER_SITE_OTHER): Payer: Medicare Other | Admitting: Family Medicine

## 2019-07-10 ENCOUNTER — Encounter: Payer: Self-pay | Admitting: Family Medicine

## 2019-07-10 ENCOUNTER — Other Ambulatory Visit: Payer: Self-pay | Admitting: Family Medicine

## 2019-07-10 DIAGNOSIS — R05 Cough: Secondary | ICD-10-CM | POA: Diagnosis not present

## 2019-07-10 DIAGNOSIS — J4521 Mild intermittent asthma with (acute) exacerbation: Secondary | ICD-10-CM | POA: Diagnosis not present

## 2019-07-10 DIAGNOSIS — R059 Cough, unspecified: Secondary | ICD-10-CM

## 2019-07-10 DIAGNOSIS — J069 Acute upper respiratory infection, unspecified: Secondary | ICD-10-CM | POA: Diagnosis not present

## 2019-07-10 DIAGNOSIS — R062 Wheezing: Secondary | ICD-10-CM | POA: Diagnosis not present

## 2019-07-10 MED ORDER — GUAIFENESIN-CODEINE 100-10 MG/5ML PO SYRP: 5.0000 mL | ORAL_SOLUTION | Freq: Four times a day (QID) | ORAL | 0 refills | Status: DC | PRN

## 2019-07-10 MED ORDER — ALBUTEROL SULFATE HFA 108 (90 BASE) MCG/ACT IN AERS: 2.0000 | INHALATION_SPRAY | Freq: Four times a day (QID) | RESPIRATORY_TRACT | 0 refills | Status: DC | PRN

## 2019-07-10 MED ORDER — PREDNISONE 10 MG PO TABS: ORAL_TABLET | ORAL | 0 refills | Status: DC

## 2019-07-10 NOTE — Patient Instructions (Signed)
Take prednisone as directed- this should help wheezing/breathing and cough  Drink fluids and rest  Try the robitussin ac for cough with caution of sedation Get tested for covid (isolate yourself) and call us with the result  Update if not starting to improve in a week or if worsening

## 2019-07-10 NOTE — Telephone Encounter (Signed)
This seems like too much - the more she uses albuterol the less effective it gets. If she's needing so often may need other medicines - would offer further evaluation. What symptoms is she having to need so frequent use?  I will refill albuterol inhaler at this time.
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
## 2019-07-10 NOTE — Telephone Encounter (Signed)
Patient called today requesting a refill on her albuterol inhaler  Patient stated she was advised by Dr Damita Dunnings to use this as often as needed. She said she is doing 2 puffs 2-3 times a day and 2-3 times at night so patient is almost out.   She would like it sent to the [address] [place]    Can you look at this since Dr Damita Dunnings is out of office.

## 2019-07-10 NOTE — Assessment & Plan Note (Signed)
Suspect pt is having reactive airways from a resp virus  Prednisone taper given (disc side eff) Refilled albuterol  Plans to get tested for covid  inst to update if worse/seek care in ER if severe

## 2019-07-10 NOTE — Assessment & Plan Note (Signed)
Suspect caught this from grandchild Has worsened reactive airways and chronic cough  Prednisone taper and robitussin ac px -using caution  inst to call if worse at any time (ER if severe) or if no improvement in several days  She will get tested for covid and call us with result/remain isolated until then

## 2019-07-10 NOTE — Assessment & Plan Note (Signed)
Acute on chronic  Exposed to uri from grandchild last week  Now productive cough and wheezing tx with prednisone and robitussin ac (warning of sedation)  inst to call if worse or if no imp in several days  Plans to get tested for covid

## 2019-07-10 NOTE — Telephone Encounter (Addendum)
Spoke with pt relaying Dr. Synthia Innocent message.  States [name] told her to go ahead and use it and that he may have to prescribe something else.  Pt uses 3-4 times during day and sometimes once at night.  Says it is not as effective as before.  Having more frequent cough and wheezing.  Now is able to cough up mucous.  Notified pt Dr. Darnell Level sent in a refill for inhaler.  Scheduled virtual visit with Dr. Glori Bickers today at 4:15.  FYI to Dr. Darnell Level.

## 2019-07-11 NOTE — Telephone Encounter (Signed)
Noted. Thanks.

## 2019-07-23 ENCOUNTER — Telehealth: Payer: Self-pay | Admitting: *Deleted

## 2019-07-23 NOTE — Telephone Encounter (Signed)
Glad to hear she is feeling better.

## 2019-07-23 NOTE — Telephone Encounter (Signed)
Patient called to let Dr. Glori Bickers know that she is doing much better. Patient stated that she was tested for covid and the test came back negative. Patent stated that she is not wheezing any more. Patient stated that she is not coughing anymore. Patient stated that she is using her inhaler twice a day and her nose spay. Patient stated that she does have cough medication left over and will use it if her cough comes back. Patient stated that she wanted to let Dr. Glori Bickers know that she appreciates her taking care of her while [name] was out. Advised patient to call back and let us know if she starts back with any symptoms and she verbalized understanding.

## 2019-08-21 ENCOUNTER — Other Ambulatory Visit: Payer: Self-pay | Admitting: Family Medicine

## 2019-08-21 NOTE — Telephone Encounter (Signed)
Refill request Alprazolam Last refill 05/15/19 #30/1 Last office visit 07/10/19 acute/video

## 2019-08-21 NOTE — Telephone Encounter (Signed)
Sent. Thanks.   

## 2019-08-22 ENCOUNTER — Telehealth: Payer: Self-pay

## 2019-08-22 NOTE — Telephone Encounter (Signed)
I spoke with pt; lt side of Chest hurts with sharp pain that comes and goes for 2 - 3 days. Last night last time CP was 08/21/19 at night. Pt is coughing a lot and inhaler being used 5 x a day and it helps just a little. Some SOB when moving around but no SOB when sitting or laying and pt having dizziness now. Pt has been worried a lot. Pt has prod cough with clear phlegm. Pt does not feel at all good. No fever but does has h/a. Pt will go to [facility] ED by car; if condition worsens prior to leaving pt will call 911.

## 2019-08-23 NOTE — Telephone Encounter (Signed)
Noted.  Thanks.  Will await ER report.   

## 2019-09-10 ENCOUNTER — Telehealth: Payer: Self-pay

## 2019-09-10 NOTE — Telephone Encounter (Signed)
Pt request to change albuterol inhaler to a different med for wheezing. Pt said she is not sick more than usual now but pt having to use the albuterol inhaler every 6 hours. Pt has enough albuterol inhaler and flonase until my chart appt on 09/12/19 at 3 PM with Dr Damita Dunnings. The phone # for my chart visit is [phone]. If pt worsens prior to appt pt will go to UC. FYI to Dr Damita Dunnings.

## 2019-09-11 NOTE — Telephone Encounter (Signed)
Noted.  Will discuss with patient at office visit.  Thanks.

## 2019-09-12 ENCOUNTER — Ambulatory Visit (INDEPENDENT_AMBULATORY_CARE_PROVIDER_SITE_OTHER): Payer: Medicare HMO | Admitting: Family Medicine

## 2019-09-12 ENCOUNTER — Other Ambulatory Visit: Payer: Self-pay

## 2019-09-12 ENCOUNTER — Encounter: Payer: Self-pay | Admitting: Family Medicine

## 2019-09-12 VITALS — Ht 65.0 in | Wt 214.0 lb

## 2019-09-12 DIAGNOSIS — R05 Cough: Secondary | ICD-10-CM

## 2019-09-12 DIAGNOSIS — R059 Cough, unspecified: Secondary | ICD-10-CM

## 2019-09-12 DIAGNOSIS — R062 Wheezing: Secondary | ICD-10-CM

## 2019-09-12 MED ORDER — FLOVENT HFA 110 MCG/ACT IN AERO
2.0000 | INHALATION_SPRAY | Freq: Two times a day (BID) | RESPIRATORY_TRACT | 12 refills | Status: DC
Start: 2019-09-12 — End: 2020-11-05

## 2019-09-12 MED ORDER — ALBUTEROL SULFATE HFA 108 (90 BASE) MCG/ACT IN AERS: 2.0000 | INHALATION_SPRAY | Freq: Four times a day (QID) | RESPIRATORY_TRACT | 3 refills | Status: DC | PRN

## 2019-09-12 MED ORDER — PREDNISONE 10 MG PO TABS: ORAL_TABLET | ORAL | 0 refills | Status: DC

## 2019-09-12 NOTE — Progress Notes (Signed)
Interactive audio and video telecommunications were attempted between this provider and patient, however failed, due to patient having technical difficulties OR patient did not have access to video capability.  We continued and completed visit with audio only.   Virtual Visit via Telephone Note  I connected with patient on 09/12/19  at 3:58 PM  by telephone and verified that I am speaking with the correct person using two identifiers.  Location of patient: home  Location of MD: [place] Name of referring provider (if blank then none associated): Names per persons and role in encounter:  MD: Earlyne Iba, Patient: name listed above.    I discussed the limitations, risks, security and privacy concerns of performing an evaluation and management service by telephone and the availability of in person appointments. I also discussed with the patient that there may be a patient responsible charge related to this service. The patient expressed understanding and agreed to proceed.  CC: cough  History of Present Illness:   Cough.  Not present all the time.  Coughing comes in spells.  Scant sputum, almost none, clear/whitish.  Some wheeze.  Some cough at night.  SABA helps for a little while.  Never smoker.  No fevers, no vomiting.  Longstanding cough.    Prednisone prev clearly helped with the cough.     Observations/Objective: No apparent distress Speech baseline  Assessment and Plan: Cough.  Prednisone clearly helped previously.  I suspect she has a reactive component given her prednisone response and her albuterol use.  Still okay for outpatient follow-up. Restart prednisone.  Routine steroid cautions discussed. Add on flovent.  Routine cautions discussed, discussed rinsing after use. Use SABA prn.   She will update me as needed.   covid vaccine d/w pt.  She didn't recall brand but had it done per her report.  She agrees to plan.  Follow Up Instructions: see above.     I  discussed the assessment and treatment plan with the patient. The patient was provided an opportunity to ask questions and all were answered. The patient agreed with the plan and demonstrated an understanding of the instructions.   The patient was advised to call back or seek an in-person evaluation if the symptoms worsen or if the condition fails to improve as anticipated.  I provided 23 minutes of non-face-to-face time during this encounter.   [name], MD

## 2019-09-13 ENCOUNTER — Telehealth: Payer: Self-pay

## 2019-09-13 NOTE — Telephone Encounter (Signed)
Medication was not ready at the pharmacy yesterday but is ready for pick up now.  She is going to get it now and will call back next week if not better.  She says she has a knot behind her left ear that is very sore.

## 2019-09-13 NOTE — Telephone Encounter (Signed)
Agree with her updating [place] if the cough isn't better.  If the knot persists then I need to check her in person.  Thanks.

## 2019-09-13 NOTE — Telephone Encounter (Signed)
Patient contacted the office and states that she has had a cough for about 1 week, and she states she is kind of wheezy, and the cough is semi-productive. She states when she coughs, it feels as if she has something very painful in her throat, and she states the pain will radiate up behind her nose and to her left ear. I offered the patient a virtual visit, and advised urgent care, but the patient states she just wants to talk to [name]. Please advise.

## 2019-09-13 NOTE — Telephone Encounter (Signed)
Patient advised.

## 2019-09-13 NOTE — Telephone Encounter (Signed)
Did she get the inhaler and prednisone from yesterday?

## 2019-09-16 NOTE — Assessment & Plan Note (Signed)
Cough.  Prednisone clearly helped previously.  I suspect she has a reactive component given her prednisone response and her albuterol use.  Still okay for outpatient follow-up. Restart prednisone.  Routine steroid cautions discussed. Add on flovent.  Routine cautions discussed, discussed rinsing after use. Use SABA prn.   She will update me as needed.   covid vaccine d/w pt.  She didn't recall brand but had it done per her report.  She agrees to plan.

## 2019-10-21 ENCOUNTER — Telehealth: Payer: Self-pay

## 2019-10-21 ENCOUNTER — Other Ambulatory Visit: Payer: Self-pay | Admitting: Family Medicine

## 2019-10-21 NOTE — Telephone Encounter (Signed)
Electronic refill request. Alprazolam Last office visit:   09/12/2019  Cough Last Filled:    30 tablet 1 08/21/2019  Please advise.

## 2019-10-21 NOTE — Telephone Encounter (Signed)
Pt said she has not gotten cb from nurse pt spoke with last wk about her getting assistance from [facility] and pt wants cb about the medicaid; pt does not know the name of the person she spoke with but pt said Dr Damita Dunnings asked nurse to call pt.  Also pt having problems urinating; pt goes to bathroom to urinate but it is very slow to come out, urine is dark and has bad odor. Pt wants referral to see kidney dr.pt does not have vaginal discharge or itching. I advised pt we would need to see pt in office prior to referral but Dr Damita Dunnings does not have appt until 10/24/19 and pt does not want to wait that long and request referral. Pt request cb after Dr Damita Dunnings reviews this note.

## 2019-10-22 NOTE — Telephone Encounter (Signed)
Left detailed message on voicemail.  

## 2019-10-22 NOTE — Telephone Encounter (Signed)
Sent. Thanks.   

## 2019-10-22 NOTE — Telephone Encounter (Signed)
I do not think she needs a nephrology referral based on dark urine, especially since her kidney function in terms of her creatinine has been normal.  The concern is for having a UTI versus relative dehydration and she is going to be able to be seen here sooner than at the kidney clinic.  Please check with patient about this.  I do not know about the Medicaid conversation.  Please see what details you can get on that.  Thanks.

## 2019-11-19 ENCOUNTER — Other Ambulatory Visit: Payer: Self-pay

## 2019-11-19 ENCOUNTER — Telehealth: Payer: Self-pay

## 2019-11-19 ENCOUNTER — Emergency Department: Payer: Medicare HMO

## 2019-11-19 ENCOUNTER — Encounter: Payer: Self-pay | Admitting: Emergency Medicine

## 2019-11-19 ENCOUNTER — Inpatient Hospital Stay
Admission: EM | Admit: 2019-11-19 | Discharge: 2019-11-21 | DRG: 177 | Disposition: A | Discharge status: Home Health | Payer: Medicare HMO | Attending: Internal Medicine | Admitting: Internal Medicine

## 2019-11-19 DIAGNOSIS — Z96653 Presence of artificial knee joint, bilateral: Secondary | ICD-10-CM | POA: Diagnosis present

## 2019-11-19 DIAGNOSIS — Z955 Presence of coronary angioplasty implant and graft: Secondary | ICD-10-CM

## 2019-11-19 DIAGNOSIS — J9811 Atelectasis: Secondary | ICD-10-CM | POA: Diagnosis not present

## 2019-11-19 DIAGNOSIS — R0902 Hypoxemia: Secondary | ICD-10-CM

## 2019-11-19 DIAGNOSIS — K219 Gastro-esophageal reflux disease without esophagitis: Secondary | ICD-10-CM | POA: Diagnosis present

## 2019-11-19 DIAGNOSIS — Z8249 Family history of ischemic heart disease and other diseases of the circulatory system: Secondary | ICD-10-CM

## 2019-11-19 DIAGNOSIS — J90 Pleural effusion, not elsewhere classified: Secondary | ICD-10-CM | POA: Diagnosis not present

## 2019-11-19 DIAGNOSIS — Z87442 Personal history of urinary calculi: Secondary | ICD-10-CM

## 2019-11-19 DIAGNOSIS — N390 Urinary tract infection, site not specified: Secondary | ICD-10-CM | POA: Diagnosis not present

## 2019-11-19 DIAGNOSIS — I2510 Atherosclerotic heart disease of native coronary artery without angina pectoris: Secondary | ICD-10-CM | POA: Diagnosis not present

## 2019-11-19 DIAGNOSIS — Z888 Allergy status to other drugs, medicaments and biological substances status: Secondary | ICD-10-CM

## 2019-11-19 DIAGNOSIS — J9601 Acute respiratory failure with hypoxia: Secondary | ICD-10-CM | POA: Diagnosis not present

## 2019-11-19 DIAGNOSIS — U071 COVID-19: Principal | ICD-10-CM

## 2019-11-19 DIAGNOSIS — E871 Hypo-osmolality and hyponatremia: Secondary | ICD-10-CM | POA: Diagnosis present

## 2019-11-19 DIAGNOSIS — R3915 Urgency of urination: Secondary | ICD-10-CM | POA: Diagnosis present

## 2019-11-19 DIAGNOSIS — F329 Major depressive disorder, single episode, unspecified: Secondary | ICD-10-CM | POA: Diagnosis present

## 2019-11-19 DIAGNOSIS — Z79899 Other long term (current) drug therapy: Secondary | ICD-10-CM | POA: Diagnosis not present

## 2019-11-19 DIAGNOSIS — Z9049 Acquired absence of other specified parts of digestive tract: Secondary | ICD-10-CM

## 2019-11-19 DIAGNOSIS — R0602 Shortness of breath: Secondary | ICD-10-CM | POA: Diagnosis not present

## 2019-11-19 DIAGNOSIS — E860 Dehydration: Secondary | ICD-10-CM | POA: Diagnosis not present

## 2019-11-19 DIAGNOSIS — Z7901 Long term (current) use of anticoagulants: Secondary | ICD-10-CM | POA: Diagnosis not present

## 2019-11-19 DIAGNOSIS — E785 Hyperlipidemia, unspecified: Secondary | ICD-10-CM | POA: Diagnosis present

## 2019-11-19 DIAGNOSIS — Z881 Allergy status to other antibiotic agents status: Secondary | ICD-10-CM

## 2019-11-19 DIAGNOSIS — E538 Deficiency of other specified B group vitamins: Secondary | ICD-10-CM | POA: Diagnosis present

## 2019-11-19 DIAGNOSIS — Z86718 Personal history of other venous thrombosis and embolism: Secondary | ICD-10-CM | POA: Diagnosis not present

## 2019-11-19 DIAGNOSIS — J1282 Pneumonia due to coronavirus disease 2019: Secondary | ICD-10-CM | POA: Diagnosis present

## 2019-11-19 DIAGNOSIS — I480 Paroxysmal atrial fibrillation: Secondary | ICD-10-CM | POA: Diagnosis present

## 2019-11-19 DIAGNOSIS — Z7982 Long term (current) use of aspirin: Secondary | ICD-10-CM

## 2019-11-19 DIAGNOSIS — F419 Anxiety disorder, unspecified: Secondary | ICD-10-CM | POA: Diagnosis present

## 2019-11-19 DIAGNOSIS — I10 Essential (primary) hypertension: Secondary | ICD-10-CM | POA: Diagnosis not present

## 2019-11-19 DIAGNOSIS — B9620 Unspecified Escherichia coli [E. coli] as the cause of diseases classified elsewhere: Secondary | ICD-10-CM | POA: Diagnosis present

## 2019-11-19 DIAGNOSIS — I248 Other forms of acute ischemic heart disease: Secondary | ICD-10-CM | POA: Diagnosis present

## 2019-11-19 DIAGNOSIS — Z880 Allergy status to penicillin: Secondary | ICD-10-CM

## 2019-11-19 LAB — CBC
HCT: 42.8 % (ref 36.0–46.0)
Hemoglobin: 14.6 g/dL (ref 12.0–15.0)
MCH: 28.6 pg (ref 26.0–34.0)
MCHC: 34.1 g/dL (ref 30.0–36.0)
MCV: 83.8 fL (ref 80.0–100.0)
Platelets: 188 10*3/uL (ref 150–400)
RBC: 5.11 MIL/uL (ref 3.87–5.11)
RDW: 13.4 % (ref 11.5–15.5)
WBC: 5.7 10*3/uL (ref 4.0–10.5)
nRBC: 0 % (ref 0.0–0.2)

## 2019-11-19 LAB — URINALYSIS, COMPLETE (UACMP) WITH MICROSCOPIC
Bilirubin Urine: NEGATIVE
Glucose, UA: NEGATIVE mg/dL
Hgb urine dipstick: NEGATIVE
Ketones, ur: NEGATIVE mg/dL
Nitrite: NEGATIVE
Protein, ur: 100 mg/dL — AB
Specific Gravity, Urine: 1.024 (ref 1.005–1.030)
pH: 5 (ref 5.0–8.0)

## 2019-11-19 LAB — PROCALCITONIN
Procalcitonin: <0.1 ng/mL
Procalcitonin: <0.1 ng/mL

## 2019-11-19 LAB — CBC WITH DIFFERENTIAL/PLATELET
Abs Immature Granulocytes: 0.04 10*3/uL (ref 0.00–0.07)
Basophils Absolute: 0 10*3/uL (ref 0.0–0.1)
Basophils Relative: 0 %
Eosinophils Absolute: 0 10*3/uL (ref 0.0–0.5)
Eosinophils Relative: 0 %
HCT: 44.3 % (ref 36.0–46.0)
Hemoglobin: 15.1 g/dL — ABNORMAL HIGH (ref 12.0–15.0)
Immature Granulocytes: 1 %
Lymphocytes Relative: 21 %
Lymphs Abs: 1.1 10*3/uL (ref 0.7–4.0)
MCH: 28.5 pg (ref 26.0–34.0)
MCHC: 34.1 g/dL (ref 30.0–36.0)
MCV: 83.6 fL (ref 80.0–100.0)
Monocytes Absolute: 0.4 10*3/uL (ref 0.1–1.0)
Monocytes Relative: 7 %
Neutro Abs: 3.7 10*3/uL (ref 1.7–7.7)
Neutrophils Relative %: 71 %
Platelets: 171 10*3/uL (ref 150–400)
RBC: 5.3 MIL/uL — ABNORMAL HIGH (ref 3.87–5.11)
RDW: 13.7 % (ref 11.5–15.5)
WBC: 5.2 10*3/uL (ref 4.0–10.5)
nRBC: 0 % (ref 0.0–0.2)

## 2019-11-19 LAB — HEPATIC FUNCTION PANEL
ALT: 18 U/L (ref 0–44)
AST: 35 U/L (ref 15–41)
Albumin: 3.9 g/dL (ref 3.5–5.0)
Alkaline Phosphatase: 82 U/L (ref 38–126)
Bilirubin, Direct: 0.2 mg/dL (ref 0.0–0.2)
Indirect Bilirubin: 0.8 mg/dL (ref 0.3–0.9)
Total Bilirubin: 1 mg/dL (ref 0.3–1.2)
Total Protein: 8.4 g/dL — ABNORMAL HIGH (ref 6.5–8.1)

## 2019-11-19 LAB — TROPONIN I (HIGH SENSITIVITY): Troponin I (High Sensitivity): 18 ng/L — ABNORMAL HIGH (ref <18)

## 2019-11-19 LAB — LACTATE DEHYDROGENASE: LDH: 190 U/L (ref 98–192)

## 2019-11-19 LAB — BASIC METABOLIC PANEL
Anion gap: 12 (ref 5–15)
BUN: 34 mg/dL — ABNORMAL HIGH (ref 8–23)
CO2: 23 mmol/L (ref 22–32)
Calcium: 8.6 mg/dL — ABNORMAL LOW (ref 8.9–10.3)
Chloride: 95 mmol/L — ABNORMAL LOW (ref 98–111)
Creatinine, Ser: 1.16 mg/dL — ABNORMAL HIGH (ref 0.44–1.00)
GFR calc Af Amer: 51 mL/min — ABNORMAL LOW (ref >60)
GFR calc non Af Amer: 44 mL/min — ABNORMAL LOW (ref >60)
Glucose, Bld: 117 mg/dL — ABNORMAL HIGH (ref 70–99)
Potassium: 3.7 mmol/L (ref 3.5–5.1)
Sodium: 130 mmol/L — ABNORMAL LOW (ref 135–145)

## 2019-11-19 LAB — FIBRIN DERIVATIVES D-DIMER (ARMC ONLY): Fibrin derivatives D-dimer (ARMC): 876.37 ng/mL (FEU) — ABNORMAL HIGH (ref 0.00–499.00)

## 2019-11-19 LAB — FERRITIN: Ferritin: 394 ng/mL — ABNORMAL HIGH (ref 11–307)

## 2019-11-19 LAB — BRAIN NATRIURETIC PEPTIDE: B Natriuretic Peptide: 56 pg/mL (ref 0.0–100.0)

## 2019-11-19 LAB — FIBRINOGEN: Fibrinogen: 510 mg/dL — ABNORMAL HIGH (ref 210–475)

## 2019-11-19 LAB — SARS CORONAVIRUS 2 BY RT PCR (HOSPITAL ORDER, PERFORMED IN ~~LOC~~ HOSPITAL LAB): SARS Coronavirus 2: POSITIVE — AB

## 2019-11-19 MED ORDER — ALBUTEROL SULFATE HFA 108 (90 BASE) MCG/ACT IN AERS
2.0000 | INHALATION_SPRAY | Freq: Four times a day (QID) | RESPIRATORY_TRACT | Status: DC | PRN
  Filled 2019-11-19: qty 6.7

## 2019-11-19 MED ORDER — METHYLPREDNISOLONE SODIUM SUCC 125 MG IJ SOLR
1.0000 mg/kg | Freq: Two times a day (BID) | INTRAMUSCULAR | Status: DC
  Administered 2019-11-19 – 2019-11-20 (×3): 96.875 mg via INTRAVENOUS
  Filled 2019-11-19 (×3): qty 2

## 2019-11-19 MED ORDER — SODIUM CHLORIDE 0.9 % IV SOLN
1.0000 g | INTRAVENOUS | Status: DC
  Administered 2019-11-20 (×2): 1 g via INTRAVENOUS
  Filled 2019-11-19 (×2): qty 10
  Filled 2019-11-19: qty 1

## 2019-11-19 MED ORDER — SERTRALINE HCL 50 MG PO TABS
100.0000 mg | ORAL_TABLET | Freq: Every day | ORAL | Status: DC
  Administered 2019-11-19 – 2019-11-21 (×3): 100 mg via ORAL
  Filled 2019-11-19 (×3): qty 2

## 2019-11-19 MED ORDER — GUAIFENESIN-DM 100-10 MG/5ML PO SYRP
10.0000 mL | ORAL_SOLUTION | ORAL | Status: DC | PRN
  Filled 2019-11-19: qty 10

## 2019-11-19 MED ORDER — ISOSORBIDE MONONITRATE ER 30 MG PO TB24
30.0000 mg | ORAL_TABLET | Freq: Every day | ORAL | Status: DC
  Administered 2019-11-19 – 2019-11-21 (×3): 30 mg via ORAL
  Filled 2019-11-19 (×3): qty 1

## 2019-11-19 MED ORDER — PANTOPRAZOLE SODIUM 40 MG PO TBEC
40.0000 mg | DELAYED_RELEASE_TABLET | Freq: Every day | ORAL | Status: DC
  Administered 2019-11-19 – 2019-11-21 (×3): 40 mg via ORAL
  Filled 2019-11-19 (×3): qty 1

## 2019-11-19 MED ORDER — FLUTICASONE PROPIONATE 50 MCG/ACT NA SUSP
2.0000 | Freq: Every day | NASAL | Status: DC
  Filled 2019-11-19 (×2): qty 16

## 2019-11-19 MED ORDER — SODIUM CHLORIDE 0.9 % IV SOLN
200.0000 mg | Freq: Once | INTRAVENOUS | Status: AC
  Administered 2019-11-19: 200 mg via INTRAVENOUS
  Filled 2019-11-19: qty 200

## 2019-11-19 MED ORDER — VITAMIN B-12 1000 MCG PO TABS
1000.0000 ug | ORAL_TABLET | Freq: Every day | ORAL | Status: DC
  Administered 2019-11-20 – 2019-11-21 (×2): 1000 ug via ORAL
  Filled 2019-11-19 (×3): qty 1

## 2019-11-19 MED ORDER — METOPROLOL TARTRATE 25 MG PO TABS
25.0000 mg | ORAL_TABLET | Freq: Two times a day (BID) | ORAL | Status: DC
  Administered 2019-11-19 – 2019-11-21 (×4): 25 mg via ORAL
  Filled 2019-11-19 (×5): qty 1

## 2019-11-19 MED ORDER — HYDROCOD POLST-CPM POLST ER 10-8 MG/5ML PO SUER: 5.0000 mL | Freq: Two times a day (BID) | ORAL | Status: DC | PRN

## 2019-11-19 MED ORDER — APIXABAN 5 MG PO TABS
5.0000 mg | ORAL_TABLET | Freq: Two times a day (BID) | ORAL | Status: DC
  Administered 2019-11-19 – 2019-11-21 (×4): 5 mg via ORAL
  Filled 2019-11-19 (×4): qty 1

## 2019-11-19 MED ORDER — DILTIAZEM HCL ER COATED BEADS 180 MG PO CP24
180.0000 mg | ORAL_CAPSULE | Freq: Every day | ORAL | Status: DC
  Administered 2019-11-19 – 2019-11-21 (×3): 180 mg via ORAL
  Filled 2019-11-19 (×3): qty 1

## 2019-11-19 MED ORDER — PREDNISONE 50 MG PO TABS: 50.0000 mg | ORAL_TABLET | Freq: Every day | ORAL | Status: DC

## 2019-11-19 MED ORDER — MAGNESIUM HYDROXIDE 400 MG/5ML PO SUSP
30.0000 mL | Freq: Every day | ORAL | Status: DC | PRN
  Filled 2019-11-19: qty 30

## 2019-11-19 MED ORDER — VITAMIN D 25 MCG (1000 UNIT) PO TABS
2000.0000 [IU] | ORAL_TABLET | Freq: Every day | ORAL | Status: DC
  Administered 2019-11-20 – 2019-11-21 (×3): 2000 [IU] via ORAL
  Filled 2019-11-19 (×3): qty 2

## 2019-11-19 MED ORDER — ZINC SULFATE 220 (50 ZN) MG PO CAPS
220.0000 mg | ORAL_CAPSULE | Freq: Every day | ORAL | Status: DC
  Administered 2019-11-19 – 2019-11-21 (×3): 220 mg via ORAL
  Filled 2019-11-19 (×3): qty 1

## 2019-11-19 MED ORDER — ONDANSETRON HCL 4 MG/2ML IJ SOLN
4.0000 mg | Freq: Four times a day (QID) | INTRAMUSCULAR | Status: DC | PRN
  Administered 2019-11-19: 4 mg via INTRAVENOUS
  Filled 2019-11-19: qty 2

## 2019-11-19 MED ORDER — FAMOTIDINE 20 MG PO TABS
20.0000 mg | ORAL_TABLET | Freq: Two times a day (BID) | ORAL | Status: DC
  Administered 2019-11-19 – 2019-11-21 (×4): 20 mg via ORAL
  Filled 2019-11-19 (×5): qty 1

## 2019-11-19 MED ORDER — CYCLOBENZAPRINE HCL 10 MG PO TABS
5.0000 mg | ORAL_TABLET | Freq: Three times a day (TID) | ORAL | Status: DC
  Administered 2019-11-19 – 2019-11-21 (×5): 5 mg via ORAL
  Filled 2019-11-19 (×5): qty 1

## 2019-11-19 MED ORDER — SODIUM CHLORIDE 0.9 % IV BOLUS
1000.0000 mL | Freq: Once | INTRAVENOUS | Status: AC
  Administered 2019-11-19: 1000 mL via INTRAVENOUS

## 2019-11-19 MED ORDER — SODIUM CHLORIDE 0.9 % IV SOLN: INTRAVENOUS | Status: DC

## 2019-11-19 MED ORDER — ATORVASTATIN CALCIUM 20 MG PO TABS
80.0000 mg | ORAL_TABLET | Freq: Every day | ORAL | Status: DC
  Administered 2019-11-19 – 2019-11-21 (×3): 80 mg via ORAL
  Filled 2019-11-19 (×3): qty 4

## 2019-11-19 MED ORDER — SODIUM CHLORIDE 0.9 % IV SOLN
100.0000 mg | Freq: Every day | INTRAVENOUS | Status: DC
  Administered 2019-11-20 – 2019-11-21 (×2): 100 mg via INTRAVENOUS
  Filled 2019-11-19 (×2): qty 20
  Filled 2019-11-19: qty 100

## 2019-11-19 MED ORDER — GUAIFENESIN-CODEINE 100-10 MG/5ML PO SOLN: 5.0000 mL | Freq: Four times a day (QID) | ORAL | Status: DC | PRN

## 2019-11-19 MED ORDER — GUAIFENESIN ER 600 MG PO TB12
600.0000 mg | ORAL_TABLET | Freq: Two times a day (BID) | ORAL | Status: DC
  Administered 2019-11-19 – 2019-11-21 (×4): 600 mg via ORAL
  Filled 2019-11-19 (×5): qty 1

## 2019-11-19 MED ORDER — ALPRAZOLAM 0.5 MG PO TABS: 0.5000 mg | ORAL_TABLET | Freq: Every day | ORAL | Status: DC | PRN

## 2019-11-19 MED ORDER — VITAMIN D 25 MCG (1000 UNIT) PO TABS: 1000.0000 [IU] | ORAL_TABLET | Freq: Every day | ORAL | Status: DC

## 2019-11-19 MED ORDER — ASCORBIC ACID 500 MG PO TABS
500.0000 mg | ORAL_TABLET | Freq: Every day | ORAL | Status: DC
  Administered 2019-11-19 – 2019-11-21 (×3): 500 mg via ORAL
  Filled 2019-11-19 (×3): qty 1

## 2019-11-19 MED ORDER — ACETAMINOPHEN 325 MG PO TABS: 650.0000 mg | ORAL_TABLET | Freq: Four times a day (QID) | ORAL | Status: DC | PRN

## 2019-11-19 MED ORDER — BARICITINIB 2 MG PO TABS
2.0000 mg | ORAL_TABLET | Freq: Every day | ORAL | Status: DC
  Administered 2019-11-19 – 2019-11-20 (×2): 2 mg via ORAL
  Filled 2019-11-19 (×2): qty 1

## 2019-11-19 MED ORDER — TRAZODONE HCL 50 MG PO TABS: 25.0000 mg | ORAL_TABLET | Freq: Every evening | ORAL | Status: DC | PRN

## 2019-11-19 MED ORDER — FLUTICASONE PROPIONATE HFA 110 MCG/ACT IN AERO
2.0000 | INHALATION_SPRAY | Freq: Two times a day (BID) | RESPIRATORY_TRACT | Status: DC
  Administered 2019-11-20 – 2019-11-21 (×3): 2 via RESPIRATORY_TRACT
  Filled 2019-11-19: qty 12

## 2019-11-19 MED ORDER — ASPIRIN EC 81 MG PO TBEC
81.0000 mg | DELAYED_RELEASE_TABLET | Freq: Every day | ORAL | Status: DC
  Administered 2019-11-19 – 2019-11-21 (×3): 81 mg via ORAL
  Filled 2019-11-19 (×3): qty 1

## 2019-11-19 MED ORDER — ONDANSETRON HCL 4 MG PO TABS: 4.0000 mg | ORAL_TABLET | Freq: Four times a day (QID) | ORAL | Status: DC | PRN

## 2019-11-19 NOTE — ED Notes (Signed)
Syndi NT at bedside.

## 2019-11-19 NOTE — ED Notes (Signed)
[name] [phone]. Daughter updated with pt's verbal okay.

## 2019-11-19 NOTE — ED Triage Notes (Signed)
Pt via pov from home with dizziness, body aches, fever, diarrhea, nausea x 4 days. Pt states she tried to go to pcp and they didn't have any appointments, so they sent her here. Pt alert & oriented, nad noted.

## 2019-11-19 NOTE — ED Notes (Addendum)
Pt placed on 2L via . Pt up to bedside toilet. Hat in toilet to collect urine sample. Pt reports history of HTN. States has not taken her BP meds yet today.

## 2019-11-19 NOTE — ED Notes (Signed)
Pt c/o diarrhea, SOB, body aches, fever last few days. Pt tachypnic while laying in bed. Dyspnea noted at times while in bed. Pt reports history of asthma. Denies O2 use at home. 92-93% RA. Irregular heart rhythm noted on monitor.

## 2019-11-19 NOTE — ED Notes (Signed)
2nd set of cultures taken from R ac.

## 2019-11-19 NOTE — ED Notes (Signed)
Xray being completed. Will collect 2nd set of cultures once done.

## 2019-11-19 NOTE — ED Notes (Addendum)
Pt on phone with daughter Otila Kluver. Will give rest of verified meds and pull new blood work once pt done. Linens and briefs changed as pt accidentally wet the bed with urine.

## 2019-11-19 NOTE — ED Notes (Signed)
Attempted for 22g and cultures at L hand; pt pulled back hand and vein burst.

## 2019-11-19 NOTE — Telephone Encounter (Signed)
Agree with ER eval. Will await ER notes.

## 2019-11-19 NOTE — ED Notes (Signed)
Strong cough present.

## 2019-11-19 NOTE — ED Notes (Signed)
Blood cultures verbal order from [place]. First taken at [facility].

## 2019-11-19 NOTE — ED Notes (Signed)
Pt reports that she lost sense of taste on Thursday; it has since returned.

## 2019-11-19 NOTE — ED Provider Notes (Signed)
Dragon is down.  CC: n/v/d/cough/short o60f breath and aching alkl over  HPI pt w/ above complaints for 2-3 days told nurses vomiting and diarrhea several times but bone here./ Pt tells me that she is aching all over and had a cough that was productive of yellow phlegm but that it is now white. She has been having a fever and is short of breath if she walks.Achihng is moderatye and nothing seems to help it.   She h as not had a covid test.  meds  Medication Sig Dispensed Refills Start Date End Date Status  cholecalciferol, vitamin D3, (CHOLECALCIFEROL) 1,000 unit tablet  Take by mouth.  0   Active  ascorbic acid (ASCORBIC ACID WITH ROSE HIPS) 500 MG tablet  Take 500 mg by mouth.  0   Active  cyanocobalamin (VITAMIN B-12) 1000 MCG tablet  Take by mouth.  0   Active  ocular lubricant (REFRESH P.M.) 57.3-42.5 % Oint  as needed.  0   Active  polymyxin B sulf-trimethoprim (POLYTRIM) 10,000 unit- 1 mg/mL Drop  1 drop 3 (three) times daily.  0   Active  promethazine (PHENERGAN) 25 MG tablet  Take 25 mg by mouth.  0 11/20/2012  Active  lansoprazole (PREVACID) 15 MG capsule  TAKE ONE CAPSULE BY MOUTH TWICE DAILY  0 03/10/2013  Active  atorvastatin (LIPITOR) 80 MG tablet  TAKE ONE TABLET BY MOUTH EVERY DAY  0 03/22/2013  Active  sertraline (ZOLOFT) 100 MG tablet  TAKE ONE TABLET BY MOUTH EVERY DAY  0 03/22/2013  Active  furosemide (LASIX) 20 MG tablet  Take 20 mg by mouth.  0 03/22/2013  Active  diltiazem (TAZTIA XT) 240 MG 24 hr capsule  TAKE ONE CAPSULE BY MOUTH EVERY DAY  0 03/22/2013  Active  aspirin (ECOTRIN) 81 MG tablet  Take 81 mg by mouth.  0   Active  ACETAMINOPHEN (TYLENOL ORAL)  Take by mouth.  0   Active  bacitracin ophthalmic ointment  Use small amount on sutures 4 times a day for 12-14 days. Discontinue if allergy symptoms develop and call our office. 3.5 g  2 07/09/2013  Active  oxyCODONE-acetaminophen (PERCOCET) 5-325 mg per tablet  Take  1 tablet by mouth every four (4) hours as needed for pain. 7 tablet                 PMH copied from old records as was meds       Coronary artery disease    Arthritis    Hypertension    Snoring    Encounter for screening colonoscopy    Diarrhea        ROS:  COnstitutional alert OX3 achy Eyes no visual complaints Ears No complaints Ht No complaints Lungs cough short of breath  abd  See hpi  back / musculoskeletal: achy all over  skin: no rash reported  neruo no focal weakness Psych: no complaints  PE wdwn f alert Ox3 looks well Head NCATY  EYES conjunctiva nl  mouth : no erythema or exudate Neck  No stridor  chest no tenderness or masses Ht rrr no mur audible  lungs clr no retractions, breathing slightly fast abd Soft bs+ not tender  ext No edema  neuro No focal deficites seen or reported.   ekg   read and interp by me nas at 74  l axis no acute changes.   cxr slight increased markings most c/w covid though rads reads possible sl effusions. I reviewed film  A/P pt w/ multiple covid sx. And positive test. She desats with mild exertion. She will require admission for oxyfgen and treatment of covid. She is slsightly hyponatremic as well.    Nena Polio, MD 11/19/19 1745

## 2019-11-19 NOTE — ED Notes (Signed)
Pt resting calmly in bed. Bed locked low. Rail up. Call bell within reach.

## 2019-11-19 NOTE — Telephone Encounter (Signed)
Pt said she has not eaten or drank anything since 11/14/19; pt is nauseated, pt has not vomited but feels like needs to vomit. Very dizzy, room spinning and feels like heavy weight on her which started on  11/14/19.pt said has run fever since 11/14/19. Pt having continuous dull mid chest pain since 11/14/19. SOB upon exertion. No H/A,abd pain. Pt has watery diarrhea since 11/14/19. Pt declines 911/ pt said her granddaughter is with her and she and pts  daughter will take pt to [facility] ED for eval and needed testing and possible IV fluids for dehydration. FYI to Dr Damita Dunnings.

## 2019-11-19 NOTE — ED Notes (Signed)
Pt resting calmly in bed.

## 2019-11-19 NOTE — ED Notes (Addendum)
2nd RN agrees to & will give rest of meds and collect rest of blood work momentarily. This RN has been unable to do so herself as has been, and remains, in another emergency. Charge RN Annie Main made aware this RN cannot visit bedside currently and to check on pt regularly. Agricultural consultant agreed.

## 2019-11-19 NOTE — ED Notes (Signed)
Pt walked with this RN in room. Pt somewhat unsteady from dizziness. Sat dec to 86% but came up to 94% RA after 1 minute of deep breathing. Provider [facility] notified.

## 2019-11-19 NOTE — Consult Note (Signed)
Remdesivir - Pharmacy Brief Note   O:  ALT: 18 CXR: Prominent interstitial lung markings may represent developing interstitial edema or an atypical infectious process. SpO2: 97% on Room air    A/P:  Remdesivir 200 mg IVPB once followed by 100 mg IVPB daily x 4 days.   Benn Moulder, PharmD Pharmacy Resident  11/19/2019 6:42 PM
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
## 2019-11-20 ENCOUNTER — Encounter: Payer: Self-pay | Admitting: Family Medicine

## 2019-11-20 DIAGNOSIS — J1282 Pneumonia due to coronavirus disease 2019: Secondary | ICD-10-CM

## 2019-11-20 DIAGNOSIS — R0902 Hypoxemia: Secondary | ICD-10-CM | POA: Insufficient documentation

## 2019-11-20 DIAGNOSIS — U071 COVID-19: Principal | ICD-10-CM

## 2019-11-20 LAB — CBC WITH DIFFERENTIAL/PLATELET
Abs Immature Granulocytes: 0.03 10*3/uL (ref 0.00–0.07)
Basophils Absolute: 0 10*3/uL (ref 0.0–0.1)
Basophils Relative: 0 %
Eosinophils Absolute: 0 10*3/uL (ref 0.0–0.5)
Eosinophils Relative: 0 %
HCT: 41.4 % (ref 36.0–46.0)
Hemoglobin: 13.7 g/dL (ref 12.0–15.0)
Immature Granulocytes: 1 %
Lymphocytes Relative: 17 %
Lymphs Abs: 0.8 10*3/uL (ref 0.7–4.0)
MCH: 28.6 pg (ref 26.0–34.0)
MCHC: 33.1 g/dL (ref 30.0–36.0)
MCV: 86.4 fL (ref 80.0–100.0)
Monocytes Absolute: 0.1 10*3/uL (ref 0.1–1.0)
Monocytes Relative: 2 %
Neutro Abs: 4 10*3/uL (ref 1.7–7.7)
Neutrophils Relative %: 80 %
Platelets: 164 10*3/uL (ref 150–400)
RBC: 4.79 MIL/uL (ref 3.87–5.11)
RDW: 13.6 % (ref 11.5–15.5)
WBC: 5 10*3/uL (ref 4.0–10.5)
nRBC: 0 % (ref 0.0–0.2)

## 2019-11-20 LAB — COMPREHENSIVE METABOLIC PANEL
ALT: 17 U/L (ref 0–44)
AST: 35 U/L (ref 15–41)
Albumin: 3.3 g/dL — ABNORMAL LOW (ref 3.5–5.0)
Alkaline Phosphatase: 74 U/L (ref 38–126)
Anion gap: 7 (ref 5–15)
BUN: 23 mg/dL (ref 8–23)
CO2: 24 mmol/L (ref 22–32)
Calcium: 8.2 mg/dL — ABNORMAL LOW (ref 8.9–10.3)
Chloride: 102 mmol/L (ref 98–111)
Creatinine, Ser: 0.89 mg/dL (ref 0.44–1.00)
GFR calc Af Amer: >60 mL/min (ref >60)
GFR calc non Af Amer: >60 mL/min (ref >60)
Glucose, Bld: 140 mg/dL — ABNORMAL HIGH (ref 70–99)
Potassium: 3.8 mmol/L (ref 3.5–5.1)
Sodium: 133 mmol/L — ABNORMAL LOW (ref 135–145)
Total Bilirubin: 0.8 mg/dL (ref 0.3–1.2)
Total Protein: 7.3 g/dL (ref 6.5–8.1)

## 2019-11-20 LAB — GLUCOSE, CAPILLARY: Glucose-Capillary: 104 mg/dL — ABNORMAL HIGH (ref 70–99)

## 2019-11-20 LAB — FERRITIN: Ferritin: 378 ng/mL — ABNORMAL HIGH (ref 11–307)

## 2019-11-20 LAB — C-REACTIVE PROTEIN
CRP: 3 mg/dL — ABNORMAL HIGH (ref <1.0)
CRP: 3.6 mg/dL — ABNORMAL HIGH (ref <1.0)

## 2019-11-20 LAB — TROPONIN I (HIGH SENSITIVITY): Troponin I (High Sensitivity): 23 ng/L — ABNORMAL HIGH (ref <18)

## 2019-11-20 LAB — FIBRIN DERIVATIVES D-DIMER (ARMC ONLY): Fibrin derivatives D-dimer (ARMC): 935.48 ng/mL (FEU) — ABNORMAL HIGH (ref 0.00–499.00)

## 2019-11-20 NOTE — ED Notes (Signed)
Pt helped to walk to bathroom.

## 2019-11-20 NOTE — ED Notes (Signed)
Pt able to ambulate to restroom ?

## 2019-11-20 NOTE — ED Notes (Signed)
Introduced self to pt. Pt denies any needs at this time.

## 2019-11-20 NOTE — ED Notes (Signed)
Pt awoke briefly to say she feels good this morning

## 2019-11-20 NOTE — ED Notes (Signed)
Pt resting in NAD

## 2019-11-20 NOTE — Progress Notes (Signed)
PROGRESS NOTE    [name]  EGB:[id number] DOB: 1935/04/30 DOA: 11/19/2019 PCP: [name], MD   Brief Narrative: Taken from H&P. [name]  is a 84 y.o. African-American vaccinated female with a known history of hypertension, dyslipidemia and osteoarthritis, coronary artery disease status post PCI and stent, atrial fibrillation on Eliquis and lower extremity DVT, who presented to the emergency room with acute onset of generalized body aching, dizziness and cold chills with tactile fever since Thursday with loss of taste and smell as well as diarrhea for 5 days without nausea or vomiting.  Increased urinary frequency and urgency for the past 2 days.  Sick contact with grandson. Admitted for COVID-19 pneumonia and UTI.   Subjective: Patient was feeling better when seen today.  She is fully vaccinated, completed vaccine regimen in July.  Apparently had a sick contact with grandson a week prior to her symptoms who was also positive for Covid.  Denies any shortness of breath, mild cough.  Denies any lower abdominal pain or dysuria.  Do have some increased urinary urgency and frequency for the past 2 days.  Assessment & Plan:   Active Problems:   Pneumonia due to COVID-19 virus  Acute hypoxic respiratory failure secondary to COVID-19 pneumonia. Hypoxic respiratory failure improving.  Chest x-ray with some prominent interstitial lung markings which can represent developing interstitial edema or an atypical infectious process.  Being positive for COVID-19, that can be due to COVID-19 pneumonia.  Small unchanged bilateral pleural effusions. Mildly elevated inflammatory markers.  Procalcitonin negative.  Saturating in high 90s on 2 L. Not much respiratory symptoms. -Continue remdesivir-day 2. -Continue with Solu-Medrol-day 2 -Discontinue baricitinib as patient has very minimum oxygen requirement and CRP of 3. -Continue to monitor inflammatory markers. -Continue with supportive care and  supplements. -Try weaning her off from oxygen.  Elevated troponin/history of CAD.  Patient has borderline elevation of troponin, no chest pain or acute EKG changes. Most likely secondary to demand ischemia. -Continue home dose of Imdur and beta-blocker.  Possible UTI.  Patient with increased urinary frequency and urgency.  UA with pyuria.  Urine cultures pending.  Blood cultures negative. -Continue Rocephin for now. -Follow-up urinary cultures  Paroxysmal atrial fibrillation.  Currently in sinus rhythm. -Continue home dose of Cardizem, Lopressor and Eliquis.  Hypertension.  Blood pressure within goal. -Continue home dose of Cardizem  Depression and anxiety.  No acute concern. -Continue home dose of Zoloft and Xanax.  Vitamin B12 deficiency. -Continue vitamin B12.  GERD. -Continue PPI.  Objective: Vitals:   11/20/19 0237 11/20/19 0623 11/20/19 0700 11/20/19 0800  BP: 119/70 (!) 143/77 127/66 122/65  Pulse: 73 (!) 57 (!) 53 73  Resp: (!) 22 19 18  (!) 21  Temp:      TempSrc:      SpO2: 95% 95% 96% 96%  Weight:      Height:       No intake or output data in the 24 hours ending 11/20/19 0824 Filed Weights   11/19/19 1302  Weight: 97.1 kg    Examination:  General exam: Well-developed elderly lady, appears calm and comfortable  Respiratory system: Bilateral scattered rhonchi, respiratory effort normal. Cardiovascular system: S1 & S2 heard, RRR. No JVD, murmurs, rubs, Gastrointestinal system: Soft, nontender, nondistended, bowel sounds positive. Central nervous system: Alert and oriented. No focal neurological deficits. Extremities: No edema, no cyanosis, pulses intact and symmetrical. Psychiatry: Judgement and insight appear normal. Mood & affect appropriate.    DVT prophylaxis: Eliquis Code  Status: Full Family Communication: Granddaughter Cordelia Pen was updated on phone . Disposition Plan:  Status is: Inpatient  Remains inpatient appropriate  because:Inpatient level of care appropriate due to severity of illness   Dispo: The patient is from: Home              Anticipated d/c is to: Home              Anticipated d/c date is: 2 days              Patient currently is not medically stable to d/c.  Patient might be able to go home in a day if able to wean off from oxygen.  She can complete remdesivir as an outpatient.  Consultants:   None  Procedures:   Antimicrobials:  Rocephin  Data Reviewed: I have personally reviewed following labs and imaging studies  CBC: Recent Labs  Lab 11/19/19 1308 11/19/19 1529 11/20/19 0238  WBC 5.7 5.2 5.0  NEUTROABS  --  3.7 4.0  HGB 14.6 15.1* 13.7  HCT 42.8 44.3 41.4  MCV 83.8 83.6 86.4  PLT 188 171 412   Basic Metabolic Panel: Recent Labs  Lab 11/19/19 1308 11/20/19 0238  NA 130* 133*  K 3.7 3.8  CL 95* 102  CO2 23 24  GLUCOSE 117* 140*  BUN 34* 23  CREATININE 1.16* 0.89  CALCIUM 8.6* 8.2*   GFR: Estimated Creatinine Clearance: 58.2 mL/min (by C-G formula based on SCr of 0.89 mg/dL). Liver Function Tests: Recent Labs  Lab 11/19/19 1529 11/20/19 0238  AST 35 35  ALT 18 17  ALKPHOS 82 74  BILITOT 1.0 0.8  PROT 8.4* 7.3  ALBUMIN 3.9 3.3*   No results for input(s): LIPASE, AMYLASE in the last 168 hours. No results for input(s): AMMONIA in the last 168 hours. Coagulation Profile: No results for input(s): INR, PROTIME in the last 168 hours. Cardiac Enzymes: No results for input(s): CKTOTAL, CKMB, CKMBINDEX, TROPONINI in the last 168 hours. BNP (last 3 results) No results for input(s): PROBNP in the last 8760 hours. HbA1C: No results for input(s): HGBA1C in the last 72 hours. CBG: Recent Labs  Lab 11/19/19 1308  GLUCAP 104*   Lipid Profile: No results for input(s): CHOL, HDL, LDLCALC, TRIG, CHOLHDL, LDLDIRECT in the last 72 hours. Thyroid Function Tests: No results for input(s): TSH, T4TOTAL, FREET4, T3FREE, THYROIDAB in the last 72 hours. Anemia  Panel: Recent Labs    11/19/19 2207 11/20/19 0238  FERRITIN 394* 378*   Sepsis Labs: Recent Labs  Lab 11/19/19 1635 11/19/19 2207  PROCALCITON <0.10 <0.10    Recent Results (from the past 240 hour(s))  SARS Coronavirus 2 by RT PCR (hospital order, performed in [facility] hospital lab) Nasopharyngeal Nasopharyngeal Swab     Status: Abnormal   Collection Time: 11/19/19  3:30 PM   Specimen: Nasopharyngeal Swab  Result Value Ref Range Status   SARS Coronavirus 2 POSITIVE (A) NEGATIVE Final    Comment: RESULT CALLED TO, READ BACK BY AND VERIFIED WITH: GEORGIE HAHLE 11/19/19 AT 39 BY AR (NOTE) SARS-CoV-2 target nucleic acids are DETECTED  SARS-CoV-2 RNA is generally detectable in upper respiratory specimens  during the acute phase of infection.  Positive results are indicative  of the presence of the identified virus, but do not rule out bacterial infection or co-infection with other pathogens not detected by the test.  Clinical correlation with patient history and  other diagnostic information is necessary to determine patient infection status.  The  expected result is negative.  Fact Sheet for Patients:   StrictlyIdeas.no   Fact Sheet for Healthcare Providers:   BankingDealers.co.za    This test is not yet approved or cleared by the [place] FDA and  has been authorized for detection and/or diagnosis of SARS-CoV-2 by FDA under an Emergency Use Authorization (EUA).  This EUA will remain in effect (meaning this te st can be used) for the duration of  the COVID-19 declaration under Section 564(b)(1) of the Act, 21 U.S.C. section 360-bbb-3(b)(1), unless the authorization is terminated or revoked sooner.  Performed at [facility], [address] [place], [place] [address]      Radiology Studies: DG Chest Portable 1 View  Result Date: 11/19/2019 CLINICAL DATA:  Shortness of breath EXAM: PORTABLE CHEST 1 VIEW  COMPARISON:  12/28/2018 FINDINGS: There are prominent interstitial lung markings bilaterally. There is blunting of the costophrenic angles bilaterally, similar to prior study. The heart size is stable but mildly enlarged. Aortic calcifications are noted. There is no pneumothorax. There are streaky bibasilar airspace opacities. There is no acute osseous abnormality. There is stable shift of the trachea to the right. IMPRESSION: 1. Prominent interstitial lung markings may represent developing interstitial edema or an atypical infectious process. 2. Streaky bibasilar airspace opacities favored to represent atelectasis with an infiltrate not entirely excluded. 3. Possible small bilateral pleural effusions, similar to prior study. Electronically Signed   By: Constance Holster M.D.   On: 11/19/2019 16:13    Scheduled Meds: . apixaban  5 mg Oral BID  . vitamin C  500 mg Oral Daily  . aspirin EC  81 mg Oral Daily  . atorvastatin  80 mg Oral Daily  . baricitinib  2 mg Oral Daily  . cholecalciferol  2,000 Units Oral Daily  . cyclobenzaprine  5 mg Oral TID  . diltiazem  180 mg Oral Daily  . famotidine  20 mg Oral BID  . fluticasone  2 spray Each Nare Daily  . fluticasone  2 puff Inhalation BID  . guaiFENesin  600 mg Oral BID  . isosorbide mononitrate  30 mg Oral Daily  . methylPREDNISolone (SOLU-MEDROL) injection  1 mg/kg Intravenous Q12H   Followed by  . [START ON 11/22/2019] predniSONE  50 mg Oral Daily  . metoprolol tartrate  25 mg Oral BID  . pantoprazole  40 mg Oral Daily  . sertraline  100-150 mg Oral Daily  . vitamin B-12  1,000 mcg Oral Daily  . zinc sulfate  220 mg Oral Daily   Continuous Infusions: . sodium chloride 100 mL/hr at 11/20/19 0038  . cefTRIAXone (ROCEPHIN)  IV Stopped (11/20/19 0521)  . remdesivir 100 mg in NS 100 mL       LOS: 1 day   Time spent: 35 minutes.  Lorella Nimrod, MD [facility]  If 7PM-7AM, please contact night-coverage [email or web address]  11/20/2019,  8:24 AM   This record has been created using Systems analyst. Errors have been sought and corrected,but may not always be located. Such creation errors do not reflect on the standard of care.

## 2019-11-21 ENCOUNTER — Telehealth: Payer: Self-pay

## 2019-11-21 LAB — URINE CULTURE: Culture: >=100000 — AB

## 2019-11-21 LAB — CBC WITH DIFFERENTIAL/PLATELET
Abs Immature Granulocytes: 0.02 10*3/uL (ref 0.00–0.07)
Basophils Absolute: 0 10*3/uL (ref 0.0–0.1)
Basophils Relative: 0 %
Eosinophils Absolute: 0 10*3/uL (ref 0.0–0.5)
Eosinophils Relative: 0 %
HCT: 35.7 % — ABNORMAL LOW (ref 36.0–46.0)
Hemoglobin: 11.9 g/dL — ABNORMAL LOW (ref 12.0–15.0)
Immature Granulocytes: 1 %
Lymphocytes Relative: 26 %
Lymphs Abs: 0.8 10*3/uL (ref 0.7–4.0)
MCH: 28.6 pg (ref 26.0–34.0)
MCHC: 33.3 g/dL (ref 30.0–36.0)
MCV: 85.8 fL (ref 80.0–100.0)
Monocytes Absolute: 0.2 10*3/uL (ref 0.1–1.0)
Monocytes Relative: 6 %
Neutro Abs: 2 10*3/uL (ref 1.7–7.7)
Neutrophils Relative %: 67 %
Platelets: 166 10*3/uL (ref 150–400)
RBC: 4.16 MIL/uL (ref 3.87–5.11)
RDW: 13.7 % (ref 11.5–15.5)
WBC: 2.9 10*3/uL — ABNORMAL LOW (ref 4.0–10.5)
nRBC: 0 % (ref 0.0–0.2)

## 2019-11-21 LAB — COMPREHENSIVE METABOLIC PANEL
ALT: 14 U/L (ref 0–44)
AST: 27 U/L (ref 15–41)
Albumin: 3 g/dL — ABNORMAL LOW (ref 3.5–5.0)
Alkaline Phosphatase: 59 U/L (ref 38–126)
Anion gap: 9 (ref 5–15)
BUN: 27 mg/dL — ABNORMAL HIGH (ref 8–23)
CO2: 25 mmol/L (ref 22–32)
Calcium: 8.5 mg/dL — ABNORMAL LOW (ref 8.9–10.3)
Chloride: 102 mmol/L (ref 98–111)
Creatinine, Ser: 1 mg/dL (ref 0.44–1.00)
GFR calc Af Amer: >60 mL/min (ref >60)
GFR calc non Af Amer: 53 mL/min — ABNORMAL LOW (ref >60)
Glucose, Bld: 143 mg/dL — ABNORMAL HIGH (ref 70–99)
Potassium: 3.8 mmol/L (ref 3.5–5.1)
Sodium: 136 mmol/L (ref 135–145)
Total Bilirubin: 1 mg/dL (ref 0.3–1.2)
Total Protein: 6.6 g/dL (ref 6.5–8.1)

## 2019-11-21 LAB — FERRITIN: Ferritin: 396 ng/mL — ABNORMAL HIGH (ref 11–307)

## 2019-11-21 LAB — FIBRIN DERIVATIVES D-DIMER (ARMC ONLY): Fibrin derivatives D-dimer (ARMC): 602.77 ng/mL (FEU) — ABNORMAL HIGH (ref 0.00–499.00)

## 2019-11-21 LAB — C-REACTIVE PROTEIN: CRP: 1.4 mg/dL — ABNORMAL HIGH (ref <1.0)

## 2019-11-21 MED ORDER — PREDNISONE 50 MG PO TABS: 50.0000 mg | ORAL_TABLET | Freq: Every day | ORAL | 0 refills | Status: AC

## 2019-11-21 MED ORDER — ASCORBIC ACID 500 MG PO TABS
500.0000 mg | ORAL_TABLET | Freq: Every day | ORAL | 0 refills | Status: DC
Start: 2019-11-22 — End: 2020-07-31

## 2019-11-21 MED ORDER — ZINC SULFATE 220 (50 ZN) MG PO CAPS
220.0000 mg | ORAL_CAPSULE | Freq: Every day | ORAL | 0 refills | Status: DC
Start: 2019-11-22 — End: 2020-04-23

## 2019-11-21 MED ORDER — SULFAMETHOXAZOLE-TRIMETHOPRIM 800-160 MG PO TABS
1.0000 | ORAL_TABLET | Freq: Two times a day (BID) | ORAL | Status: DC
  Filled 2019-11-21: qty 1

## 2019-11-21 MED ORDER — GUAIFENESIN-CODEINE 100-10 MG/5ML PO SOLN
5.0000 mL | Freq: Four times a day (QID) | ORAL | 0 refills | Status: DC | PRN
Start: 2019-11-21 — End: 2020-04-23

## 2019-11-21 MED ORDER — SULFAMETHOXAZOLE-TRIMETHOPRIM 800-160 MG PO TABS: 1.0000 | ORAL_TABLET | Freq: Two times a day (BID) | ORAL | 0 refills | Status: AC

## 2019-11-21 NOTE — Discharge Instructions (Addendum)
Patient scheduled for outpatient Remdesivir infusions at 11 am Friday 9/10 and Saturday 9/11  at [facility]. Please inform the patient to park at [place], as staff will be escorting the patient through the [place] entrance of the hospital.    There is a wave flag banner located near the entrance on [address]. Turn into this entrance and immediately turn left and park in 1 of the 5 designated Covid Infusion Parking spots. There is a phone number on the sign, please call and let the staff know what spot you are in and we will come out and get you. For questions call [phone].  Thanks.

## 2019-11-21 NOTE — Discharge Summary (Signed)
Physician Discharge Summary  [name] LFY:[id number] DOB: Jul 21, 1935 DOA: 11/19/2019  PCP: [name], MD  Admit date: 11/19/2019 Discharge date: 11/21/2019  Admitted From: home Disposition: home    Recommendations for Outpatient Follow-up:  1. Follow up with PCP in 1-2 weeks 2. Please obtain BMP/CBC in one week 3. Please follow up on the following pending results:None  Home Health: yes Equipment/Devices:Rolling walker Discharge Condition: Stable CODE STATUS: full Diet recommendation: Heart Healthy / Carb Modified   Brief/Interim Summary: WilmaBreezeis a83 y.o.African-American vaccinated femalewith a known history of hypertension, dyslipidemia and osteoarthritis, coronary artery disease status post PCI and stent, atrial fibrillation on Eliquis and lower extremity DVT, who presented to the emergency room with acute onset ofgeneralized body aching, dizziness and cold chills with tactile fever since Thursday with loss of taste and smell as well as diarrhea for 5 days without nausea or vomiting.  Increased urinary frequency and urgency for the past 2 days.  Sick contact with grandson.  Admitted for COVID-19 pneumonia and UTI.  Urine culture grew pansensitive E. coli.  Patient initially required low oxygen with mild hypoxia which quickly resolved.  She was also started on remdesivir and steroid.  Responded well.  She was discharged home after 3 days of remdesivir and will complete that therapy as an outpatient which was arranged at [facility] hospital in [place]. She was also given 3 more days of Bactrim on discharge.  She will continue with rest of her home meds and follow-up with his primary care provider.   Discharge Diagnoses:  Active Problems:   COVID-19   Discharge Instructions  Discharge Instructions    Diet - low sodium heart healthy   Complete by: As directed    Discharge instructions   Complete by: As directed    It was pleasure taking care of  you. Please go to [facility] in [place] for next 2 days to complete your treatment, you will be provided with transportation.Your appointment is set for 11 AM . Continue taking antibiotics for 3 days for UTI. Continue taking steroids for 5 days. Keep yourself well hydrated and follow up with your PCP.   Increase activity slowly   Complete by: As directed      Allergies as of 11/21/2019      Reactions   Amoxicillin Swelling   Face hands   Penicillins Swelling, Other (See Comments)   Has patient had a PCN reaction causing immediate rash, facial/tongue/throat swelling, SOB or lightheadedness with hypotension: Yes Has patient had a PCN reaction causing severe rash involving mucus membranes or skin necrosis: No Has patient had a PCN reaction that required hospitalization No phalosporin use.   Valium [diazepam] Other (See Comments)   Pt had shakes   Imdur [isosorbide Nitrate]    Headache at 60mg  dose      Medication List    TAKE these medications   albuterol 108 (90 Base) MCG/ACT inhaler Commonly known as: VENTOLIN HFA Inhale 2 puffs into the lungs every 6 (six) hours as needed for wheezing or shortness of breath.   ALPRAZolam 0.5 MG tablet Commonly known as: XANAX TAKE 1 TABLET BY MOUTH ONCE DAILY AS NEEDED FOR ANXIETY What changed: See the new instructions.   apixaban 5 MG Tabs tablet Commonly known as: ELIQUIS Take 1 tablet (5 mg total) by mouth 2 (two) times daily.   ascorbic acid 500 MG tablet Commonly known as: VITAMIN C Take 1 tablet (500 mg total) by mouth daily. Start taking on: November 22, 2019   atorvastatin 80 MG tablet Commonly known as: LIPITOR Take 1 tablet (80 mg total) by mouth daily.   cyclobenzaprine 5 MG tablet Commonly known as: FLEXERIL TAKE 1 TABLET BY MOUTH THREE TIMES DAILY AS NEEDED FOR MUSCLE SPASM *SEDATION CAUTION* What changed: See the new instructions.   diltiazem 180 MG 24 hr capsule Commonly known as: CARDIZEM CD Take 180 mg by  mouth daily.   Flovent HFA 110 MCG/ACT inhaler Generic drug: fluticasone Inhale 2 puffs into the lungs in the morning and at bedtime. Rinse after use.   fluticasone 50 MCG/ACT nasal spray Commonly known as: FLONASE Place 2 sprays into both nostrils daily.   guaiFENesin-codeine 100-10 MG/5ML syrup Take 5 mLs by mouth 4 (four) times daily as needed for cough. What changed: additional instructions   isosorbide mononitrate 30 MG 24 hr tablet Commonly known as: IMDUR Take 30 mg by mouth daily.   lansoprazole 30 MG capsule Commonly known as: PREVACID Take 1 capsule (30 mg total) by mouth 2 (two) times daily before a meal.   metoprolol tartrate 25 MG tablet Commonly known as: LOPRESSOR Take 1 tablet (25 mg total) by mouth 2 (two) times daily.   predniSONE 50 MG tablet Commonly known as: DELTASONE Take 1 tablet (50 mg total) by mouth daily with breakfast for 5 days.   sertraline 100 MG tablet Commonly known as: ZOLOFT Take 1-1.5 tablets (100-150 mg total) by mouth daily.   sulfamethoxazole-trimethoprim 800-160 MG tablet Commonly known as: BACTRIM DS Take 1 tablet by mouth every 12 (twelve) hours for 3 days.   vitamin B-12 1000 MCG tablet Commonly known as: CYANOCOBALAMIN Take 1,000 mcg by mouth daily.   Vitamin D 50 MCG (2000 UT) Caps Take 2,000 Units by mouth daily.   zinc sulfate 220 (50 Zn) MG capsule Take 1 capsule (220 mg total) by mouth daily. Start taking on: November 22, 2019            Durable Medical Equipment  (From admission, onward)         Start     Ordered   11/21/19 1248  For home use only DME Walker rolling  Once       Question Answer Comment  Walker: With 5 Inch Wheels   Patient needs a walker to treat with the following condition COVID-19      11/21/19 1247          Follow-up Information    [name], MD. Call on 11/28/2019.   Specialty: Family Medicine Why: @ 10:30 am Virtual Visit Contact information: [place] [address] [phone]              Allergies  Allergen Reactions  . Amoxicillin Swelling    Face hands  . Penicillins Swelling and Other (See Comments)    Has patient had a PCN reaction causing immediate rash, facial/tongue/throat swelling, SOB or lightheadedness with hypotension: Yes Has patient had a PCN reaction causing severe rash involving mucus membranes or skin necrosis: No Has patient had a PCN reaction that required hospitalization No phalosporin use.  . Valium [Diazepam] Other (See Comments)    Pt had shakes  . Imdur [Isosorbide Nitrate]     Headache at 60mg  dose    Consultations:  None  Procedures/Studies: DG Chest Portable 1 View  Result Date: 11/19/2019 CLINICAL DATA:  Shortness of breath EXAM: PORTABLE CHEST 1 VIEW COMPARISON:  12/28/2018 FINDINGS: There are prominent interstitial lung markings bilaterally. There is  blunting of the costophrenic angles bilaterally, similar to prior study. The heart size is stable but mildly enlarged. Aortic calcifications are noted. There is no pneumothorax. There are streaky bibasilar airspace opacities. There is no acute osseous abnormality. There is stable shift of the trachea to the right. IMPRESSION: 1. Prominent interstitial lung markings may represent developing interstitial edema or an atypical infectious process. 2. Streaky bibasilar airspace opacities favored to represent atelectasis with an infiltrate not entirely excluded. 3. Possible small bilateral pleural effusions, similar to prior study. Electronically Signed   By: Constance Holster M.D.   On: 11/19/2019 16:13    Subjective: Patient was feeling better when seen today. No new complaints. Wants to go home and will complete therapy as out patient.  Discharge Exam: Vitals:   11/21/19 0730 11/21/19 1118  BP: (!) 148/75 (!) 108/96  Pulse: 74 62  Resp: 20 16  Temp: 98.1 F (36.7 C) 98.3 F (36.8 C)  SpO2: 92% 93%   Vitals:   11/20/19 2004  11/20/19 2311 11/21/19 0730 11/21/19 1118  BP: (!) 130/55 (!) 155/67 (!) 148/75 (!) 108/96  Pulse: (!) 58 63 74 62  Resp: 14 13 20 16   Temp: 97.6 F (36.4 C) 98.1 F (36.7 C) 98.1 F (36.7 C) 98.3 F (36.8 C)  TempSrc: Oral Oral Oral Oral  SpO2: 92% 92% 92% 93%  Weight:      Height:        General: Pt is alert, awake, not in acute distress Cardiovascular: RRR, S1/S2 +, no rubs, no gallops Respiratory: CTA bilaterally, no wheezing, no rhonchi Abdominal: Soft, NT, ND, bowel sounds + Extremities: no edema, no cyanosis   The results of significant diagnostics from this hospitalization (including imaging, microbiology, ancillary and laboratory) are listed below for reference.    Microbiology: Recent Results (from the past 240 hour(s))  SARS Coronavirus 2 by RT PCR (hospital order, performed in [facility] hospital lab) Nasopharyngeal Nasopharyngeal Swab     Status: Abnormal   Collection Time: 11/19/19  3:30 PM   Specimen: Nasopharyngeal Swab  Result Value Ref Range Status   SARS Coronavirus 2 POSITIVE (A) NEGATIVE Final    Comment: RESULT CALLED TO, READ BACK BY AND VERIFIED WITH: GEORGIE HAHLE 11/19/19 AT 68 BY AR (NOTE) SARS-CoV-2 target nucleic acids are DETECTED  SARS-CoV-2 RNA is generally detectable in upper respiratory specimens  during the acute phase of infection.  Positive results are indicative  of the presence of the identified virus, but do not rule out bacterial infection or co-infection with other pathogens not detected by the test.  Clinical correlation with patient history and  other diagnostic information is necessary to determine patient infection status.  The expected result is negative.  Fact Sheet for Patients:   StrictlyIdeas.no   Fact Sheet for Healthcare Providers:   BankingDealers.co.za    This test is not yet approved or cleared by the [place] FDA and  has been authorized for detection and/or  diagnosis of SARS-CoV-2 by FDA under an Emergency Use Authorization (EUA).  This EUA will remain in effect (meaning this te st can be used) for the duration of  the COVID-19 declaration under Section 564(b)(1) of the Act, 21 U.S.C. section 360-bbb-3(b)(1), unless the authorization is terminated or revoked sooner.  Performed at [facility], [address] [place], [place] [address]   Urine Culture     Status: Abnormal   Collection Time: 11/19/19  3:55 PM   Specimen: Urine, Catheterized  Result Value Ref  Range Status   Specimen Description   Final    URINE, CATHETERIZED Performed at [facility], [address] [place], [place] [address]    Special Requests   Final    NONE Performed at [facility], [address] [place], [place] [address]    Culture >=100,000 COLONIES/mL ESCHERICHIA COLI (A)  Final   Report Status 11/21/2019 FINAL  Final   Organism ID, Bacteria ESCHERICHIA COLI (A)  Final      Susceptibility   Escherichia coli - MIC*    AMPICILLIN <=2 SENSITIVE Sensitive     CEFAZOLIN <=4 SENSITIVE Sensitive     CEFTRIAXONE <=0.25 SENSITIVE Sensitive     CIPROFLOXACIN <=0.25 SENSITIVE Sensitive     GENTAMICIN <=1 SENSITIVE Sensitive     IMIPENEM <=0.25 SENSITIVE Sensitive     NITROFURANTOIN <=16 SENSITIVE Sensitive     TRIMETH/SULFA <=20 SENSITIVE Sensitive     AMPICILLIN/SULBACTAM <=2 SENSITIVE Sensitive     PIP/TAZO <=4 SENSITIVE Sensitive     * >=100,000 COLONIES/mL ESCHERICHIA COLI  Blood culture (routine x 2)     Status: None (Preliminary result)   Collection Time: 11/19/19  4:04 PM   Specimen: BLOOD  Result Value Ref Range Status   Specimen Description BLOOD LEFT ANTECUBITAL  Final   Special Requests   Final    BOTTLES DRAWN AEROBIC AND ANAEROBIC Blood Culture adequate volume   Culture   Final    NO GROWTH 2 DAYS Performed at [facility], [address] [place], [place] [address]    Report Status PENDING  Incomplete   Blood culture (routine x 2)     Status: None (Preliminary result)   Collection Time: 11/19/19  4:26 PM   Specimen: BLOOD  Result Value Ref Range Status   Specimen Description BLOOD RIGHT ANTECUBITAL  Final   Special Requests   Final    BOTTLES DRAWN AEROBIC AND ANAEROBIC Blood Culture adequate volume   Culture   Final    NO GROWTH 2 DAYS Performed at  [facility], [address] [place], [place] [address]    Report Status PENDING  Incomplete     Labs: BNP (last 3 results) Recent Labs    11/19/19 2207  BNP 53.2   Basic Metabolic Panel: Recent Labs  Lab 11/19/19 1308 11/20/19 0238 11/21/19 0441  NA 130* 133* 136  K 3.7 3.8 3.8  CL 95* 102 102  CO2 23 24 25   GLUCOSE 117* 140* 143*  BUN 34* 23 27*  CREATININE 1.16* 0.89 1.00  CALCIUM 8.6* 8.2* 8.5*   Liver Function Tests: Recent Labs  Lab 11/19/19 1529 11/20/19 0238 11/21/19 0441  AST 35 35 27  ALT 18 17 14   ALKPHOS 82 74 59  BILITOT 1.0 0.8 1.0  PROT 8.4* 7.3 6.6  ALBUMIN 3.9 3.3* 3.0*   No results for input(s): LIPASE, AMYLASE in the last 168 hours. No results for input(s): AMMONIA in the last 168 hours. CBC: Recent Labs  Lab 11/19/19 1308 11/19/19 1529 11/20/19 0238 11/21/19 0441  WBC 5.7 5.2 5.0 2.9*  NEUTROABS  --  3.7 4.0 2.0  HGB 14.6 15.1* 13.7 11.9*  HCT 42.8 44.3 41.4 35.7*  MCV 83.8 83.6 86.4 85.8  PLT 188 171 164 166   Cardiac Enzymes: No results for input(s): CKTOTAL, CKMB, CKMBINDEX, TROPONINI in the last 168 hours. BNP: Invalid input(s): POCBNP CBG: Recent Labs  Lab 11/19/19 1308  GLUCAP 104*   D-Dimer No results for input(s): DDIMER in the  last 72 hours. Hgb A1c No results for input(s): HGBA1C in the last 72 hours. Lipid Profile No results for input(s): CHOL, HDL, LDLCALC, TRIG, CHOLHDL, LDLDIRECT in the last 72 hours. Thyroid function studies No results for input(s): TSH, T4TOTAL, T3FREE, THYROIDAB in the last 72 hours.  Invalid input(s): FREET3 Anemia work  up Recent Labs    11/20/19 0238 11/21/19 0441  FERRITIN 378* 396*   Urinalysis    Component Value Date/Time   COLORURINE AMBER (A) 11/19/2019 1555   APPEARANCEUR CLOUDY (A) 11/19/2019 1555   LABSPEC 1.024 11/19/2019 1555   PHURINE 5.0 11/19/2019 1555   GLUCOSEU NEGATIVE 11/19/2019 1555   HGBUR NEGATIVE 11/19/2019 1555   HGBUR negative 05/10/2010 1127   BILIRUBINUR NEGATIVE 11/19/2019 1555   BILIRUBINUR Neg 04/12/2019 [place] 11/19/2019 1555   PROTEINUR 100 (A) 11/19/2019 1555   UROBILINOGEN 0.2 04/12/2019 1530   UROBILINOGEN 1.0 12/23/2011 1018   NITRITE NEGATIVE 11/19/2019 1555   LEUKOCYTESUR MODERATE (A) 11/19/2019 1555   Sepsis Labs Invalid input(s): PROCALCITONIN,  WBC,  LACTICIDVEN Microbiology Recent Results (from the past 240 hour(s))  SARS Coronavirus 2 by RT PCR (hospital order, performed in [facility] hospital lab) Nasopharyngeal Nasopharyngeal Swab     Status: Abnormal   Collection Time: 11/19/19  3:30 PM   Specimen: Nasopharyngeal Swab  Result Value Ref Range Status   SARS Coronavirus 2 POSITIVE (A) NEGATIVE Final    Comment: RESULT CALLED TO, READ BACK BY AND VERIFIED WITH: GEORGIE HAHLE 11/19/19 AT 53 BY AR (NOTE) SARS-CoV-2 target nucleic acids are DETECTED  SARS-CoV-2 RNA is generally detectable in upper respiratory specimens  during the acute phase of infection.  Positive results are indicative  of the presence of the identified virus, but do not rule out bacterial infection or co-infection with other pathogens not detected by the test.  Clinical correlation with patient history and  other diagnostic information is necessary to determine patient infection status.  The expected result is negative.  Fact Sheet for Patients:   StrictlyIdeas.no   Fact Sheet for Healthcare Providers:   BankingDealers.co.za    This test is not yet approved or cleared by the [place] FDA and  has  been authorized for detection and/or diagnosis of SARS-CoV-2 by FDA under an Emergency Use Authorization (EUA).  This EUA will remain in effect (meaning this te st can be used) for the duration of  the COVID-19 declaration under Section 564(b)(1) of the Act, 21 U.S.C. section 360-bbb-3(b)(1), unless the authorization is terminated or revoked sooner.  Performed at [facility], [address], [place], [place] [address]   Urine Culture     Status: Abnormal   Collection Time: 11/19/19  3:55 PM   Specimen: Urine, Catheterized  Result Value Ref Range Status   Specimen Description   Final    URINE, CATHETERIZED Performed at [facility], [address] [place], [place] [address]    Special Requests   Final    NONE Performed at [facility], [address] [place], [place] [address]    Culture >=100,000 COLONIES/mL ESCHERICHIA COLI (A)  Final   Report Status 11/21/2019 FINAL  Final   Organism ID, Bacteria ESCHERICHIA COLI (A)  Final      Susceptibility   Escherichia coli - MIC*    AMPICILLIN <=2 SENSITIVE Sensitive     CEFAZOLIN <=4 SENSITIVE Sensitive     CEFTRIAXONE <=0.25 SENSITIVE Sensitive     CIPROFLOXACIN <=0.25 SENSITIVE Sensitive  GENTAMICIN <=1 SENSITIVE Sensitive     IMIPENEM <=0.25 SENSITIVE Sensitive     NITROFURANTOIN <=16 SENSITIVE Sensitive     TRIMETH/SULFA <=20 SENSITIVE Sensitive     AMPICILLIN/SULBACTAM <=2 SENSITIVE Sensitive     PIP/TAZO <=4 SENSITIVE Sensitive     * >=100,000 COLONIES/mL ESCHERICHIA COLI  Blood culture (routine x 2)     Status: None (Preliminary result)   Collection Time: 11/19/19  4:04 PM   Specimen: BLOOD  Result Value Ref Range Status   Specimen Description BLOOD LEFT ANTECUBITAL  Final   Special Requests   Final    BOTTLES DRAWN AEROBIC AND ANAEROBIC Blood Culture adequate volume   Culture   Final    NO GROWTH 2 DAYS Performed at [facility], [address] [place], [place] [address]     Report Status PENDING  Incomplete  Blood culture (routine x 2)     Status: None (Preliminary result)   Collection Time: 11/19/19  4:26 PM   Specimen: BLOOD  Result Value Ref Range Status   Specimen Description BLOOD RIGHT ANTECUBITAL  Final   Special Requests   Final    BOTTLES DRAWN AEROBIC AND ANAEROBIC Blood Culture adequate volume   Culture   Final    NO GROWTH 2 DAYS Performed at [facility], [address], [place],  [address]    Report Status PENDING  Incomplete    Time coordinating discharge: Over 30 minutes  SIGNED:  Lorella Nimrod, MD  [facility] 11/21/2019, 4:18 PM  If 7PM-7AM, please contact night-coverage [email or web address]  This record has been created using Systems analyst. Errors have been sought and corrected,but may not always be located. Such creation errors do not reflect on the standard of care.

## 2019-11-21 NOTE — Progress Notes (Signed)
Patient scheduled for outpatient Remdesivir infusions at 11 am Friday 9/10 and Saturday 9/11 at [facility]. Please inform the patient to park at [place], as staff will be escorting the patient through the [place] entrance of the hospital.    There is a wave flag banner located near the entrance on [address]. Turn into this entrance and immediately turn left and park in 1 of the 5 designated Covid Infusion Parking spots. There is a phone number on the sign, please call and let the staff know what spot you are in and we will come out and get you. For questions call [phone].  Thanks.

## 2019-11-21 NOTE — TOC Initial Note (Signed)
Transition of Care [facility]) - Initial/Assessment Note    Patient Details  Name: [name] MRN: [id number] Date of Birth: 22-Aug-1935  Transition of Care [facility]) CM/SW Contact:    Shelbie Hutching, RN Phone Number: 11/21/2019, 10:49 AM  Clinical Narrative:                 Patient admitted to the hospital with [place] currently not on any supplemental oxygen.  RNCM was able to speak with the patient bedside.  Patient reports that she is from home and her granddaughter and great grandson live with her.  Patient is independent in ADL's and has no assistive devices at home.  Patient does not drive but her granddaughter gets her where she needs to go and to appointments.  Patient is current with her PCP and uses [facility] [address] for prescriptions.  Patient expresses that she would like to have home health services at discharge and does not have an agency preference.  [place] from [place] accepted home health referral for RN, PT, OT, and aide.  Patient also requests walker for home, [facility] will provide walker and deliver to the room before discharge.    1200 pm update- MD has cleared patient for discharge home.  Granddaughter is picking up.  Patient will pick up the walker at the retail store at [facility] [address] Lorina Rabon. [facility] will call the patient and schedule time for first assessment.  Expected Discharge Plan: [place] Barriers to Discharge: Continued Medical Work up   Patient Goals and CMS Choice Patient states their goals for this hospitalization and ongoing recovery are:: To be able to go home CMS Medicare.gov Compare Post Acute Care list provided to:: Patient Choice offered to / list presented to : Patient  Expected Discharge Plan and Services Expected Discharge Plan: [place]   Discharge Planning Services: CM Consult Post Acute Care Choice: [place] arrangements for the past 2 months: Mobile Home                 DME Arranged: Walker  rolling DME Agency: [facility]       HH Arranged: RN, PT, OT, Nurse's Aide [place] Agency: [facility] Date [place] Agency Contacted: 11/21/19 Time [place]: [id number] Representative spoke with at [place]: Jana Half  Prior Living Arrangements/Services Living arrangements for the past 2 months: Mobile Home Lives with:: Relatives (Granddaughter) Patient language and need for interpreter reviewed:: Yes Do you feel safe going back to the place where you live?: Yes      Need for Family Participation in Patient Care: Yes (Comment) (COVID) Care giver support system in place?: Yes (comment) (granddaughter, church members, niece)   Criminal Activity/Legal Involvement Pertinent to Current Situation/Hospitalization: No - Comment as needed  Activities of Daily Living Home Assistive Devices/Equipment: None ADL Screening (condition at time of admission) Patient's cognitive ability adequate to safely complete daily activities?: Yes Is the patient deaf or have difficulty hearing?: No Does the patient have difficulty seeing, even when wearing glasses/contacts?: No Does the patient have difficulty concentrating, remembering, or making decisions?: No Patient able to express need for assistance with ADLs?: Yes Does the patient have difficulty dressing or bathing?: No Independently performs ADLs?: Yes (appropriate for developmental age) Does the patient have difficulty walking or climbing stairs?: Yes Weakness of Legs: Both Weakness of Arms/Hands: None  Permission Sought/Granted Permission sought to share information with : Case Manager, Family Supports, Other (comment) Permission granted to  share information with : Yes, Verbal Permission Granted  Share Information with NAME: Otila Kluver  Permission granted to share info w AGENCY: [facility]  Permission granted to share info w Relationship: granddaughter     Emotional Assessment Appearance:: Appears stated age Attitude/Demeanor/Rapport:  Engaged Affect (typically observed): Accepting Orientation: : Oriented to Self, Oriented to Place, Oriented to  Time, Oriented to Situation Alcohol / Substance Use: Not Applicable Psych Involvement: No (comment)  Admission diagnosis:  Hypoxia [R09.02] Pneumonia due to COVID-19 virus [U07.1, J12.82] COVID-19 [U07.1] Patient Active Problem List   Diagnosis Date Noted  . Hypoxia   . COVID-19 11/19/2019  . Urinary frequency 04/14/2019  . Gout 08/21/2017  . Vision loss 07/30/2017  . Headache 07/30/2017  . Syncope 12/08/2016  . Atrial fibrillation ([place]) 12/05/2016  . Advance care planning 09/09/2016  . Fatigue 01/20/2016  . Arthritis 04/13/2015  . Rhinitis 02/25/2015  . Osteoarthritis of both knees 11/13/2014  . Cough 11/13/2013  . Flank pain 11/21/2012  . GERD (gastroesophageal reflux disease)   . Back pain 05/04/2011  . URI (upper respiratory infection) 10/27/2010  . Hand pain 10/27/2010  . Osteoporosis 08/23/2010  . UNSPECIFIED VITAMIN D DEFICIENCY 09/23/2008  . DEPRESSION/ANXIETY 09/23/2008  . Obesity (BMI 30.0-34.9) 06/21/2006  . CORONARY ARTERY DISEASE, S/P PTCA 06/21/2006  . Extrinsic asthma 06/21/2006  . Essential hypertension 06/13/1999  . Hyperlipidemia 03/15/1999   PCP:  [name], MD Pharmacy:   [facility] [address] [place] [address] [place] [address] Phone: [phone] Fax: [phone]     Social Determinants of Health (SDOH) Interventions    Readmission Risk Interventions No flowsheet data found.

## 2019-11-21 NOTE — Progress Notes (Signed)
SATURATION QUALIFICATIONS: (This note is used to comply with regulatory documentation for home oxygen)  Patient Saturations on Room Air at Rest = 92%  Patient Saturations on Room Air while Ambulating = 93%  Patient Saturations on 0 Liters of oxygen while Ambulating = 93%  Please briefly explain why patient needs home oxygen:  Patient sats between 93-96% on room air, she does not need oxygen

## 2019-11-21 NOTE — Progress Notes (Signed)
Patient received and understands discharge instructions 

## 2019-11-21 NOTE — Telephone Encounter (Signed)
Transition Care Management Follow-up Telephone Call  Date of discharge and from where: 11/21/2019, [facility]  How have you been since you were released from the hospital? Patient is doing fine since getting home  Any questions or concerns? No  Items Reviewed:  Did the pt receive and understand the discharge instructions provided? Yes   Medications obtained and verified? Yes   Any new allergies since your discharge? No   Dietary orders reviewed? Yes  Do you have support at home? Yes   Functional Questionnaire: (I = Independent and D = Dependent) ADLs: I  Bathing/Dressing- I  Meal Prep- I  Eating- I  Maintaining continence- I  Transferring/Ambulation- I  Managing Meds- I  Follow up appointments reviewed:   PCP Hospital f/u appt confirmed? Yes  Scheduled to see [name] on 11/28/2019 @ 10:30 am.  [facility] f/u appt confirmed? N/A  Are transportation arrangements needed? No   If their condition worsens, is the pt aware to call PCP or go to the Emergency Dept.? Yes  Was the patient provided with contact information for the PCP's office or ED? Yes  Was to pt encouraged to call back with questions or concerns? Yes

## 2019-11-22 ENCOUNTER — Ambulatory Visit (HOSPITAL_COMMUNITY)
Admit: 2019-11-22 | Discharge: 2019-11-22 | Disposition: A | Discharge status: Home / Self Care | Payer: Medicare HMO | Attending: Pulmonary Disease | Admitting: Pulmonary Disease

## 2019-11-22 DIAGNOSIS — U071 COVID-19: Secondary | ICD-10-CM | POA: Diagnosis not present

## 2019-11-22 MED ORDER — EPINEPHRINE 0.3 MG/0.3ML IJ SOAJ: 0.3000 mg | Freq: Once | INTRAMUSCULAR | Status: DC | PRN

## 2019-11-22 MED ORDER — FAMOTIDINE IN NACL 20-0.9 MG/50ML-% IV SOLN: 20.0000 mg | Freq: Once | INTRAVENOUS | Status: DC | PRN

## 2019-11-22 MED ORDER — METHYLPREDNISOLONE SODIUM SUCC 125 MG IJ SOLR: 125.0000 mg | Freq: Once | INTRAMUSCULAR | Status: DC | PRN

## 2019-11-22 MED ORDER — SODIUM CHLORIDE 0.9 % IV SOLN: INTRAVENOUS | Status: DC | PRN

## 2019-11-22 MED ORDER — SODIUM CHLORIDE 0.9 % IV SOLN
100.0000 mg | Freq: Once | INTRAVENOUS | Status: AC
  Administered 2019-11-22: 100 mg via INTRAVENOUS
  Filled 2019-11-22: qty 20

## 2019-11-22 MED ORDER — DIPHENHYDRAMINE HCL 50 MG/ML IJ SOLN: 50.0000 mg | Freq: Once | INTRAMUSCULAR | Status: DC | PRN

## 2019-11-22 MED ORDER — ALBUTEROL SULFATE HFA 108 (90 BASE) MCG/ACT IN AERS: 2.0000 | INHALATION_SPRAY | Freq: Once | RESPIRATORY_TRACT | Status: DC | PRN

## 2019-11-22 NOTE — Discharge Instructions (Signed)
10 Things You Can Do to Manage Your COVID-19 Symptoms at Home If you have possible or confirmed COVID-19: 1. Stay home from work and school. And stay away from other public places. If you must go out, avoid using any kind of public transportation, ridesharing, or taxis. 2. Monitor your symptoms carefully. If your symptoms get worse, call your healthcare provider immediately. 3. Get rest and stay hydrated. 4. If you have a medical appointment, call the healthcare provider ahead of time and tell them that you have or may have COVID-19. 5. For medical emergencies, call 911 and notify the dispatch personnel that you have or may have COVID-19. 6. Cover your cough and sneezes with a tissue or use the inside of your elbow. 7. Wash your hands often with soap and water for at least 20 seconds or clean your hands with an alcohol-based hand sanitizer that contains at least 60% alcohol. 8. As much as possible, stay in a specific room and away from other people in your home. Also, you should use a separate bathroom, if available. If you need to be around other people in or outside of the home, wear a mask. 9. Avoid sharing personal items with other people in your household, like dishes, towels, and bedding. 10. Clean all surfaces that are touched often, like counters, tabletops, and doorknobs. Use household cleaning sprays or wipes according to the label instructions. cdc.gov/coronavirus 09/12/2018 This information is not intended to replace advice given to you by your health care provider. Make sure you discuss any questions you have with your health care provider. Document Revised: 02/14/2019 Document Reviewed: 02/14/2019 Elsevier Patient Education  2020 Elsevier Inc.  

## 2019-11-22 NOTE — Telephone Encounter (Signed)
Noted.  Thanks.  Scheduled for video visit.

## 2019-11-22 NOTE — Progress Notes (Signed)
  Diagnosis: COVID-19  Physician: Dr. Asencion Noble  Procedure: [facility] Med: remdesivir infusion - Provided patient with remdesivir fact sheet for patients, parents and caregivers prior to infusion.  Complications: No immediate complications noted.  Discharge: Discharged home   [name] [facility] 11/22/2019

## 2019-11-23 ENCOUNTER — Ambulatory Visit (HOSPITAL_COMMUNITY)
Admit: 2019-11-23 | Discharge: 2019-11-23 | Disposition: A | Discharge status: Home / Self Care | Payer: Medicare HMO | Attending: Pulmonary Disease | Admitting: Pulmonary Disease

## 2019-11-23 DIAGNOSIS — U071 COVID-19: Secondary | ICD-10-CM | POA: Insufficient documentation

## 2019-11-23 MED ORDER — SODIUM CHLORIDE 0.9 % IV SOLN
100.0000 mg | Freq: Once | INTRAVENOUS | Status: AC
  Administered 2019-11-23: 100 mg via INTRAVENOUS
  Filled 2019-11-23: qty 20

## 2019-11-23 MED ORDER — EPINEPHRINE 0.3 MG/0.3ML IJ SOAJ: 0.3000 mg | Freq: Once | INTRAMUSCULAR | Status: DC | PRN

## 2019-11-23 MED ORDER — ALBUTEROL SULFATE HFA 108 (90 BASE) MCG/ACT IN AERS: 2.0000 | INHALATION_SPRAY | Freq: Once | RESPIRATORY_TRACT | Status: DC | PRN

## 2019-11-23 MED ORDER — DIPHENHYDRAMINE HCL 50 MG/ML IJ SOLN: 50.0000 mg | Freq: Once | INTRAMUSCULAR | Status: DC | PRN

## 2019-11-23 MED ORDER — SODIUM CHLORIDE 0.9 % IV SOLN: INTRAVENOUS | Status: DC | PRN

## 2019-11-23 MED ORDER — METHYLPREDNISOLONE SODIUM SUCC 125 MG IJ SOLR: 125.0000 mg | Freq: Once | INTRAMUSCULAR | Status: DC | PRN

## 2019-11-23 MED ORDER — FAMOTIDINE IN NACL 20-0.9 MG/50ML-% IV SOLN: 20.0000 mg | Freq: Once | INTRAVENOUS | Status: DC | PRN

## 2019-11-23 NOTE — Progress Notes (Signed)
  Diagnosis: COVID-19  Physician: Dr. Joya Gaskins  Procedure: [facility] Infusion Clinic Med: remdesivir infusion - Provided patient with remdesivir fact sheet for patients, parents and caregivers prior to infusion.  Complications: No immediate complications noted.  Discharge: Discharged home   [name] 11/23/2019

## 2019-11-23 NOTE — Discharge Instructions (Signed)
10 Things You Can Do to Manage Your COVID-19 Symptoms at Home If you have possible or confirmed COVID-19: 1. Stay home from work and school. And stay away from other public places. If you must go out, avoid using any kind of public transportation, ridesharing, or taxis. 2. Monitor your symptoms carefully. If your symptoms get worse, call your healthcare provider immediately. 3. Get rest and stay hydrated. 4. If you have a medical appointment, call the healthcare provider ahead of time and tell them that you have or may have COVID-19. 5. For medical emergencies, call 911 and notify the dispatch personnel that you have or may have COVID-19. 6. Cover your cough and sneezes with a tissue or use the inside of your elbow. 7. Wash your hands often with soap and water for at least 20 seconds or clean your hands with an alcohol-based hand sanitizer that contains at least 60% alcohol. 8. As much as possible, stay in a specific room and away from other people in your home. Also, you should use a separate bathroom, if available. If you need to be around other people in or outside of the home, wear a mask. 9. Avoid sharing personal items with other people in your household, like dishes, towels, and bedding. 10. Clean all surfaces that are touched often, like counters, tabletops, and doorknobs. Use household cleaning sprays or wipes according to the label instructions. cdc.gov/coronavirus 09/12/2018 This information is not intended to replace advice given to you by your health care provider. Make sure you discuss any questions you have with your health care provider. Document Revised: 02/14/2019 Document Reviewed: 02/14/2019 Elsevier Patient Education  2020 Elsevier Inc.  

## 2019-11-24 LAB — CULTURE, BLOOD (ROUTINE X 2)
Culture: NO GROWTH
Culture: NO GROWTH
Special Requests: ADEQUATE
Special Requests: ADEQUATE

## 2019-11-25 DIAGNOSIS — M6281 Muscle weakness (generalized): Secondary | ICD-10-CM | POA: Diagnosis not present

## 2019-11-25 DIAGNOSIS — U071 COVID-19: Secondary | ICD-10-CM | POA: Diagnosis not present

## 2019-11-26 DIAGNOSIS — I480 Paroxysmal atrial fibrillation: Secondary | ICD-10-CM | POA: Diagnosis not present

## 2019-11-26 DIAGNOSIS — U071 COVID-19: Secondary | ICD-10-CM | POA: Diagnosis not present

## 2019-11-26 DIAGNOSIS — I2510 Atherosclerotic heart disease of native coronary artery without angina pectoris: Secondary | ICD-10-CM | POA: Diagnosis not present

## 2019-11-26 DIAGNOSIS — F419 Anxiety disorder, unspecified: Secondary | ICD-10-CM | POA: Diagnosis not present

## 2019-11-26 DIAGNOSIS — J45909 Unspecified asthma, uncomplicated: Secondary | ICD-10-CM | POA: Diagnosis not present

## 2019-11-26 DIAGNOSIS — J1282 Pneumonia due to coronavirus disease 2019: Secondary | ICD-10-CM | POA: Diagnosis not present

## 2019-11-26 DIAGNOSIS — M109 Gout, unspecified: Secondary | ICD-10-CM | POA: Diagnosis not present

## 2019-11-26 DIAGNOSIS — I10 Essential (primary) hypertension: Secondary | ICD-10-CM | POA: Diagnosis not present

## 2019-11-26 DIAGNOSIS — F329 Major depressive disorder, single episode, unspecified: Secondary | ICD-10-CM | POA: Diagnosis not present

## 2019-11-27 DIAGNOSIS — I2510 Atherosclerotic heart disease of native coronary artery without angina pectoris: Secondary | ICD-10-CM | POA: Diagnosis not present

## 2019-11-27 DIAGNOSIS — J45909 Unspecified asthma, uncomplicated: Secondary | ICD-10-CM | POA: Diagnosis not present

## 2019-11-27 DIAGNOSIS — U071 COVID-19: Secondary | ICD-10-CM | POA: Diagnosis not present

## 2019-11-27 DIAGNOSIS — I480 Paroxysmal atrial fibrillation: Secondary | ICD-10-CM | POA: Diagnosis not present

## 2019-11-27 DIAGNOSIS — M109 Gout, unspecified: Secondary | ICD-10-CM | POA: Diagnosis not present

## 2019-11-27 DIAGNOSIS — J1282 Pneumonia due to coronavirus disease 2019: Secondary | ICD-10-CM | POA: Diagnosis not present

## 2019-11-27 DIAGNOSIS — I10 Essential (primary) hypertension: Secondary | ICD-10-CM | POA: Diagnosis not present

## 2019-11-27 DIAGNOSIS — F419 Anxiety disorder, unspecified: Secondary | ICD-10-CM | POA: Diagnosis not present

## 2019-11-27 DIAGNOSIS — F329 Major depressive disorder, single episode, unspecified: Secondary | ICD-10-CM | POA: Diagnosis not present

## 2019-11-28 ENCOUNTER — Telehealth: Payer: Self-pay

## 2019-11-28 ENCOUNTER — Telehealth (INDEPENDENT_AMBULATORY_CARE_PROVIDER_SITE_OTHER): Payer: Medicare HMO | Admitting: Family Medicine

## 2019-11-28 ENCOUNTER — Other Ambulatory Visit: Payer: Self-pay

## 2019-11-28 DIAGNOSIS — I480 Paroxysmal atrial fibrillation: Secondary | ICD-10-CM | POA: Diagnosis not present

## 2019-11-28 DIAGNOSIS — U071 COVID-19: Secondary | ICD-10-CM | POA: Diagnosis not present

## 2019-11-28 DIAGNOSIS — J45909 Unspecified asthma, uncomplicated: Secondary | ICD-10-CM | POA: Diagnosis not present

## 2019-11-28 DIAGNOSIS — I10 Essential (primary) hypertension: Secondary | ICD-10-CM | POA: Diagnosis not present

## 2019-11-28 DIAGNOSIS — J1282 Pneumonia due to coronavirus disease 2019: Secondary | ICD-10-CM | POA: Diagnosis not present

## 2019-11-28 DIAGNOSIS — M109 Gout, unspecified: Secondary | ICD-10-CM | POA: Diagnosis not present

## 2019-11-28 DIAGNOSIS — N390 Urinary tract infection, site not specified: Secondary | ICD-10-CM | POA: Diagnosis not present

## 2019-11-28 DIAGNOSIS — I2510 Atherosclerotic heart disease of native coronary artery without angina pectoris: Secondary | ICD-10-CM | POA: Diagnosis not present

## 2019-11-28 DIAGNOSIS — F419 Anxiety disorder, unspecified: Secondary | ICD-10-CM | POA: Diagnosis not present

## 2019-11-28 DIAGNOSIS — F329 Major depressive disorder, single episode, unspecified: Secondary | ICD-10-CM | POA: Diagnosis not present

## 2019-11-28 MED ORDER — SERTRALINE HCL 100 MG PO TABS
100.0000 mg | ORAL_TABLET | Freq: Every day | ORAL | Status: DC
Start: 2019-11-28 — End: 2019-12-27

## 2019-11-28 NOTE — Telephone Encounter (Signed)
Stephanie with OT advised.

## 2019-11-28 NOTE — Progress Notes (Signed)
Virtual visit completed through WebEx or similar program Patient location: home  Provider location: [place] at [facility], office  Participants: Patient and me (unless stated otherwise below)  Pandemic considerations d/w pt.   Limitations and rationale for visit method d/w patient.  Patient agreed to proceed.   CC: Follow-up.  HPI: Admitted with Covid and UTI.  Inpatient course discussed with patient treated supportively for both.  Status post antibiotics for UTI. No fevers. No chills.  Clearly feels better.  Eating much better recently.  No burning with urination.    Discussed Covid isolation.  She is now 2 weeks out from sx onset, and she clearly feels better, so she should be able to end quarantine at this point.    Discussed rechecking the labs at lab visit next week, we will work to get this set up.  Orders placed.  Discussed with patient.  She agrees.  History of mild neutropenia and mild anemia noted on labs.  Most appropriate for recheck in the near future without intervention otherwise right now.  Meds and allergies reviewed.   ROS: Per HPI unless specifically indicated in ROS section   NAD Speech wnl  A/P:  UTI.  Treated.  No dysuria.  Done with antibiotics.  Feeling better.  She will update me as needed  COVID.  Okay to and quarantine at this point.  She clearly feels better.  Routine cautions given to patient.  She will update me as needed.  Fortunately she was vaccinated and that likely explains/contributed to the relative lack of severe symptoms, i.e. not needing high flow oxygen or ventilation.

## 2019-11-28 NOTE — Telephone Encounter (Signed)
[name], OT with [facility], requesting verbal orders:  OT for 1x/wk for 1 wk, 2x/wk for 2 wks and 1x for 1 wk.

## 2019-12-01 NOTE — Assessment & Plan Note (Signed)
UTI.  Treated.  No dysuria.  Done with antibiotics.  Feeling better.  She will update me as needed

## 2019-12-01 NOTE — Assessment & Plan Note (Signed)
  COVID.  Okay to and quarantine at this point.  She clearly feels better.  Routine cautions given to patient.  She will update me as needed.  Fortunately she was vaccinated and that likely explains/contributed to the relative lack of severe symptoms, i.e. not needing high flow oxygen or ventilation.

## 2019-12-02 DIAGNOSIS — J1282 Pneumonia due to coronavirus disease 2019: Secondary | ICD-10-CM | POA: Diagnosis not present

## 2019-12-02 DIAGNOSIS — I480 Paroxysmal atrial fibrillation: Secondary | ICD-10-CM | POA: Diagnosis not present

## 2019-12-02 DIAGNOSIS — I10 Essential (primary) hypertension: Secondary | ICD-10-CM | POA: Diagnosis not present

## 2019-12-02 DIAGNOSIS — J45909 Unspecified asthma, uncomplicated: Secondary | ICD-10-CM | POA: Diagnosis not present

## 2019-12-02 DIAGNOSIS — M109 Gout, unspecified: Secondary | ICD-10-CM | POA: Diagnosis not present

## 2019-12-02 DIAGNOSIS — F419 Anxiety disorder, unspecified: Secondary | ICD-10-CM | POA: Diagnosis not present

## 2019-12-02 DIAGNOSIS — F329 Major depressive disorder, single episode, unspecified: Secondary | ICD-10-CM | POA: Diagnosis not present

## 2019-12-02 DIAGNOSIS — I2510 Atherosclerotic heart disease of native coronary artery without angina pectoris: Secondary | ICD-10-CM | POA: Diagnosis not present

## 2019-12-02 DIAGNOSIS — U071 COVID-19: Secondary | ICD-10-CM | POA: Diagnosis not present

## 2019-12-03 ENCOUNTER — Other Ambulatory Visit (INDEPENDENT_AMBULATORY_CARE_PROVIDER_SITE_OTHER): Payer: Medicare HMO

## 2019-12-03 ENCOUNTER — Other Ambulatory Visit: Payer: Self-pay

## 2019-12-03 DIAGNOSIS — I480 Paroxysmal atrial fibrillation: Secondary | ICD-10-CM | POA: Diagnosis not present

## 2019-12-03 DIAGNOSIS — J45909 Unspecified asthma, uncomplicated: Secondary | ICD-10-CM | POA: Diagnosis not present

## 2019-12-03 DIAGNOSIS — F329 Major depressive disorder, single episode, unspecified: Secondary | ICD-10-CM | POA: Diagnosis not present

## 2019-12-03 DIAGNOSIS — J1282 Pneumonia due to coronavirus disease 2019: Secondary | ICD-10-CM | POA: Diagnosis not present

## 2019-12-03 DIAGNOSIS — U071 COVID-19: Secondary | ICD-10-CM

## 2019-12-03 DIAGNOSIS — I2510 Atherosclerotic heart disease of native coronary artery without angina pectoris: Secondary | ICD-10-CM | POA: Diagnosis not present

## 2019-12-03 DIAGNOSIS — I10 Essential (primary) hypertension: Secondary | ICD-10-CM | POA: Diagnosis not present

## 2019-12-03 DIAGNOSIS — M109 Gout, unspecified: Secondary | ICD-10-CM | POA: Diagnosis not present

## 2019-12-03 DIAGNOSIS — F419 Anxiety disorder, unspecified: Secondary | ICD-10-CM | POA: Diagnosis not present

## 2019-12-03 LAB — COMPREHENSIVE METABOLIC PANEL
ALT: 10 U/L (ref 0–35)
AST: 14 U/L (ref 0–37)
Albumin: 3.1 g/dL — ABNORMAL LOW (ref 3.5–5.2)
Alkaline Phosphatase: 69 U/L (ref 39–117)
BUN: 9 mg/dL (ref 6–23)
CO2: 29 mEq/L (ref 19–32)
Calcium: 8.5 mg/dL (ref 8.4–10.5)
Chloride: 104 mEq/L (ref 96–112)
Creatinine, Ser: 0.77 mg/dL (ref 0.40–1.20)
GFR: 86.86 mL/min (ref >60.00)
Glucose, Bld: 85 mg/dL (ref 70–99)
Potassium: 4.3 mEq/L (ref 3.5–5.1)
Sodium: 138 mEq/L (ref 135–145)
Total Bilirubin: 0.6 mg/dL (ref 0.2–1.2)
Total Protein: 6.1 g/dL (ref 6.0–8.3)

## 2019-12-03 LAB — CBC WITH DIFFERENTIAL/PLATELET
Basophils Absolute: 0 10*3/uL (ref 0.0–0.1)
Basophils Relative: 0.4 % (ref 0.0–3.0)
Eosinophils Absolute: 0 10*3/uL (ref 0.0–0.7)
Eosinophils Relative: 0.1 % (ref 0.0–5.0)
HCT: 36.3 % (ref 36.0–46.0)
Hemoglobin: 12.1 g/dL (ref 12.0–15.0)
Lymphocytes Relative: 23.6 % (ref 12.0–46.0)
Lymphs Abs: 1.9 10*3/uL (ref 0.7–4.0)
MCHC: 33.2 g/dL (ref 30.0–36.0)
MCV: 86.7 fl (ref 78.0–100.0)
Monocytes Absolute: 0.5 10*3/uL (ref 0.1–1.0)
Monocytes Relative: 6.1 % (ref 3.0–12.0)
Neutro Abs: 5.7 10*3/uL (ref 1.4–7.7)
Neutrophils Relative %: 69.8 % (ref 43.0–77.0)
Platelets: 188 10*3/uL (ref 150.0–400.0)
RBC: 4.19 Mil/uL (ref 3.87–5.11)
RDW: 14.5 % (ref 11.5–15.5)
WBC: 8.2 10*3/uL (ref 4.0–10.5)

## 2019-12-06 DIAGNOSIS — I10 Essential (primary) hypertension: Secondary | ICD-10-CM | POA: Diagnosis not present

## 2019-12-06 DIAGNOSIS — M1990 Unspecified osteoarthritis, unspecified site: Secondary | ICD-10-CM

## 2019-12-06 DIAGNOSIS — F419 Anxiety disorder, unspecified: Secondary | ICD-10-CM | POA: Diagnosis not present

## 2019-12-06 DIAGNOSIS — J1282 Pneumonia due to coronavirus disease 2019: Secondary | ICD-10-CM | POA: Diagnosis not present

## 2019-12-06 DIAGNOSIS — E538 Deficiency of other specified B group vitamins: Secondary | ICD-10-CM

## 2019-12-06 DIAGNOSIS — E785 Hyperlipidemia, unspecified: Secondary | ICD-10-CM

## 2019-12-06 DIAGNOSIS — E559 Vitamin D deficiency, unspecified: Secondary | ICD-10-CM

## 2019-12-06 DIAGNOSIS — J45909 Unspecified asthma, uncomplicated: Secondary | ICD-10-CM | POA: Diagnosis not present

## 2019-12-06 DIAGNOSIS — Z7951 Long term (current) use of inhaled steroids: Secondary | ICD-10-CM

## 2019-12-06 DIAGNOSIS — Z955 Presence of coronary angioplasty implant and graft: Secondary | ICD-10-CM

## 2019-12-06 DIAGNOSIS — U071 COVID-19: Secondary | ICD-10-CM | POA: Diagnosis not present

## 2019-12-06 DIAGNOSIS — Z6824 Body mass index (BMI) 24.0-24.9, adult: Secondary | ICD-10-CM

## 2019-12-06 DIAGNOSIS — Z7952 Long term (current) use of systemic steroids: Secondary | ICD-10-CM

## 2019-12-06 DIAGNOSIS — K219 Gastro-esophageal reflux disease without esophagitis: Secondary | ICD-10-CM

## 2019-12-06 DIAGNOSIS — E669 Obesity, unspecified: Secondary | ICD-10-CM

## 2019-12-06 DIAGNOSIS — H547 Unspecified visual loss: Secondary | ICD-10-CM

## 2019-12-06 DIAGNOSIS — F329 Major depressive disorder, single episode, unspecified: Secondary | ICD-10-CM | POA: Diagnosis not present

## 2019-12-06 DIAGNOSIS — Z7901 Long term (current) use of anticoagulants: Secondary | ICD-10-CM

## 2019-12-06 DIAGNOSIS — Z86718 Personal history of other venous thrombosis and embolism: Secondary | ICD-10-CM

## 2019-12-06 DIAGNOSIS — M810 Age-related osteoporosis without current pathological fracture: Secondary | ICD-10-CM

## 2019-12-06 DIAGNOSIS — Z9181 History of falling: Secondary | ICD-10-CM

## 2019-12-06 DIAGNOSIS — M109 Gout, unspecified: Secondary | ICD-10-CM | POA: Diagnosis not present

## 2019-12-06 DIAGNOSIS — I2510 Atherosclerotic heart disease of native coronary artery without angina pectoris: Secondary | ICD-10-CM | POA: Diagnosis not present

## 2019-12-06 DIAGNOSIS — I480 Paroxysmal atrial fibrillation: Secondary | ICD-10-CM | POA: Diagnosis not present

## 2019-12-06 DIAGNOSIS — Z96653 Presence of artificial knee joint, bilateral: Secondary | ICD-10-CM

## 2019-12-09 DIAGNOSIS — I10 Essential (primary) hypertension: Secondary | ICD-10-CM | POA: Diagnosis not present

## 2019-12-09 DIAGNOSIS — I2510 Atherosclerotic heart disease of native coronary artery without angina pectoris: Secondary | ICD-10-CM | POA: Diagnosis not present

## 2019-12-09 DIAGNOSIS — U071 COVID-19: Secondary | ICD-10-CM | POA: Diagnosis not present

## 2019-12-09 DIAGNOSIS — F419 Anxiety disorder, unspecified: Secondary | ICD-10-CM | POA: Diagnosis not present

## 2019-12-09 DIAGNOSIS — M109 Gout, unspecified: Secondary | ICD-10-CM | POA: Diagnosis not present

## 2019-12-09 DIAGNOSIS — J1282 Pneumonia due to coronavirus disease 2019: Secondary | ICD-10-CM | POA: Diagnosis not present

## 2019-12-09 DIAGNOSIS — J45909 Unspecified asthma, uncomplicated: Secondary | ICD-10-CM | POA: Diagnosis not present

## 2019-12-09 DIAGNOSIS — I480 Paroxysmal atrial fibrillation: Secondary | ICD-10-CM | POA: Diagnosis not present

## 2019-12-09 DIAGNOSIS — F329 Major depressive disorder, single episode, unspecified: Secondary | ICD-10-CM | POA: Diagnosis not present

## 2019-12-10 DIAGNOSIS — F329 Major depressive disorder, single episode, unspecified: Secondary | ICD-10-CM | POA: Diagnosis not present

## 2019-12-10 DIAGNOSIS — J45909 Unspecified asthma, uncomplicated: Secondary | ICD-10-CM | POA: Diagnosis not present

## 2019-12-10 DIAGNOSIS — U071 COVID-19: Secondary | ICD-10-CM | POA: Diagnosis not present

## 2019-12-10 DIAGNOSIS — I480 Paroxysmal atrial fibrillation: Secondary | ICD-10-CM | POA: Diagnosis not present

## 2019-12-10 DIAGNOSIS — I2510 Atherosclerotic heart disease of native coronary artery without angina pectoris: Secondary | ICD-10-CM | POA: Diagnosis not present

## 2019-12-10 DIAGNOSIS — F419 Anxiety disorder, unspecified: Secondary | ICD-10-CM | POA: Diagnosis not present

## 2019-12-10 DIAGNOSIS — M109 Gout, unspecified: Secondary | ICD-10-CM | POA: Diagnosis not present

## 2019-12-10 DIAGNOSIS — I10 Essential (primary) hypertension: Secondary | ICD-10-CM | POA: Diagnosis not present

## 2019-12-10 DIAGNOSIS — J1282 Pneumonia due to coronavirus disease 2019: Secondary | ICD-10-CM | POA: Diagnosis not present

## 2019-12-11 DIAGNOSIS — J45909 Unspecified asthma, uncomplicated: Secondary | ICD-10-CM | POA: Diagnosis not present

## 2019-12-11 DIAGNOSIS — I10 Essential (primary) hypertension: Secondary | ICD-10-CM | POA: Diagnosis not present

## 2019-12-11 DIAGNOSIS — I2510 Atherosclerotic heart disease of native coronary artery without angina pectoris: Secondary | ICD-10-CM | POA: Diagnosis not present

## 2019-12-11 DIAGNOSIS — J1282 Pneumonia due to coronavirus disease 2019: Secondary | ICD-10-CM | POA: Diagnosis not present

## 2019-12-11 DIAGNOSIS — I480 Paroxysmal atrial fibrillation: Secondary | ICD-10-CM | POA: Diagnosis not present

## 2019-12-11 DIAGNOSIS — M109 Gout, unspecified: Secondary | ICD-10-CM | POA: Diagnosis not present

## 2019-12-11 DIAGNOSIS — F329 Major depressive disorder, single episode, unspecified: Secondary | ICD-10-CM | POA: Diagnosis not present

## 2019-12-11 DIAGNOSIS — F419 Anxiety disorder, unspecified: Secondary | ICD-10-CM | POA: Diagnosis not present

## 2019-12-11 DIAGNOSIS — U071 COVID-19: Secondary | ICD-10-CM | POA: Diagnosis not present

## 2019-12-13 DIAGNOSIS — Z9181 History of falling: Secondary | ICD-10-CM | POA: Diagnosis not present

## 2019-12-13 DIAGNOSIS — E785 Hyperlipidemia, unspecified: Secondary | ICD-10-CM | POA: Diagnosis not present

## 2019-12-13 DIAGNOSIS — E559 Vitamin D deficiency, unspecified: Secondary | ICD-10-CM | POA: Diagnosis not present

## 2019-12-13 DIAGNOSIS — M1990 Unspecified osteoarthritis, unspecified site: Secondary | ICD-10-CM | POA: Diagnosis not present

## 2019-12-13 DIAGNOSIS — Z6824 Body mass index (BMI) 24.0-24.9, adult: Secondary | ICD-10-CM | POA: Diagnosis not present

## 2019-12-13 DIAGNOSIS — Z7951 Long term (current) use of inhaled steroids: Secondary | ICD-10-CM | POA: Diagnosis not present

## 2019-12-13 DIAGNOSIS — Z7952 Long term (current) use of systemic steroids: Secondary | ICD-10-CM | POA: Diagnosis not present

## 2019-12-13 DIAGNOSIS — I480 Paroxysmal atrial fibrillation: Secondary | ICD-10-CM | POA: Diagnosis not present

## 2019-12-13 DIAGNOSIS — Z955 Presence of coronary angioplasty implant and graft: Secondary | ICD-10-CM | POA: Diagnosis not present

## 2019-12-13 DIAGNOSIS — Z96653 Presence of artificial knee joint, bilateral: Secondary | ICD-10-CM | POA: Diagnosis not present

## 2019-12-13 DIAGNOSIS — J1282 Pneumonia due to coronavirus disease 2019: Secondary | ICD-10-CM | POA: Diagnosis not present

## 2019-12-13 DIAGNOSIS — M810 Age-related osteoporosis without current pathological fracture: Secondary | ICD-10-CM | POA: Diagnosis not present

## 2019-12-13 DIAGNOSIS — I10 Essential (primary) hypertension: Secondary | ICD-10-CM | POA: Diagnosis not present

## 2019-12-13 DIAGNOSIS — E538 Deficiency of other specified B group vitamins: Secondary | ICD-10-CM | POA: Diagnosis not present

## 2019-12-13 DIAGNOSIS — H547 Unspecified visual loss: Secondary | ICD-10-CM | POA: Diagnosis not present

## 2019-12-13 DIAGNOSIS — Z7901 Long term (current) use of anticoagulants: Secondary | ICD-10-CM | POA: Diagnosis not present

## 2019-12-13 DIAGNOSIS — K219 Gastro-esophageal reflux disease without esophagitis: Secondary | ICD-10-CM | POA: Diagnosis not present

## 2019-12-13 DIAGNOSIS — Z86718 Personal history of other venous thrombosis and embolism: Secondary | ICD-10-CM | POA: Diagnosis not present

## 2019-12-13 DIAGNOSIS — M109 Gout, unspecified: Secondary | ICD-10-CM | POA: Diagnosis not present

## 2019-12-13 DIAGNOSIS — I2510 Atherosclerotic heart disease of native coronary artery without angina pectoris: Secondary | ICD-10-CM | POA: Diagnosis not present

## 2019-12-13 DIAGNOSIS — U071 COVID-19: Secondary | ICD-10-CM | POA: Diagnosis not present

## 2019-12-13 DIAGNOSIS — J45909 Unspecified asthma, uncomplicated: Secondary | ICD-10-CM | POA: Diagnosis not present

## 2019-12-16 DIAGNOSIS — Z96653 Presence of artificial knee joint, bilateral: Secondary | ICD-10-CM | POA: Diagnosis not present

## 2019-12-16 DIAGNOSIS — U071 COVID-19: Secondary | ICD-10-CM | POA: Diagnosis not present

## 2019-12-16 DIAGNOSIS — Z7951 Long term (current) use of inhaled steroids: Secondary | ICD-10-CM | POA: Diagnosis not present

## 2019-12-16 DIAGNOSIS — Z9181 History of falling: Secondary | ICD-10-CM | POA: Diagnosis not present

## 2019-12-16 DIAGNOSIS — E559 Vitamin D deficiency, unspecified: Secondary | ICD-10-CM | POA: Diagnosis not present

## 2019-12-16 DIAGNOSIS — Z6824 Body mass index (BMI) 24.0-24.9, adult: Secondary | ICD-10-CM | POA: Diagnosis not present

## 2019-12-16 DIAGNOSIS — Z7952 Long term (current) use of systemic steroids: Secondary | ICD-10-CM | POA: Diagnosis not present

## 2019-12-16 DIAGNOSIS — M1990 Unspecified osteoarthritis, unspecified site: Secondary | ICD-10-CM | POA: Diagnosis not present

## 2019-12-16 DIAGNOSIS — E785 Hyperlipidemia, unspecified: Secondary | ICD-10-CM | POA: Diagnosis not present

## 2019-12-16 DIAGNOSIS — M109 Gout, unspecified: Secondary | ICD-10-CM | POA: Diagnosis not present

## 2019-12-16 DIAGNOSIS — I480 Paroxysmal atrial fibrillation: Secondary | ICD-10-CM | POA: Diagnosis not present

## 2019-12-16 DIAGNOSIS — K219 Gastro-esophageal reflux disease without esophagitis: Secondary | ICD-10-CM | POA: Diagnosis not present

## 2019-12-16 DIAGNOSIS — J1282 Pneumonia due to coronavirus disease 2019: Secondary | ICD-10-CM | POA: Diagnosis not present

## 2019-12-16 DIAGNOSIS — M810 Age-related osteoporosis without current pathological fracture: Secondary | ICD-10-CM | POA: Diagnosis not present

## 2019-12-16 DIAGNOSIS — Z7901 Long term (current) use of anticoagulants: Secondary | ICD-10-CM | POA: Diagnosis not present

## 2019-12-16 DIAGNOSIS — Z86718 Personal history of other venous thrombosis and embolism: Secondary | ICD-10-CM | POA: Diagnosis not present

## 2019-12-16 DIAGNOSIS — Z955 Presence of coronary angioplasty implant and graft: Secondary | ICD-10-CM | POA: Diagnosis not present

## 2019-12-16 DIAGNOSIS — H547 Unspecified visual loss: Secondary | ICD-10-CM | POA: Diagnosis not present

## 2019-12-16 DIAGNOSIS — E538 Deficiency of other specified B group vitamins: Secondary | ICD-10-CM | POA: Diagnosis not present

## 2019-12-16 DIAGNOSIS — J45909 Unspecified asthma, uncomplicated: Secondary | ICD-10-CM | POA: Diagnosis not present

## 2019-12-16 DIAGNOSIS — I2510 Atherosclerotic heart disease of native coronary artery without angina pectoris: Secondary | ICD-10-CM | POA: Diagnosis not present

## 2019-12-16 DIAGNOSIS — I10 Essential (primary) hypertension: Secondary | ICD-10-CM | POA: Diagnosis not present

## 2019-12-18 DIAGNOSIS — E785 Hyperlipidemia, unspecified: Secondary | ICD-10-CM | POA: Diagnosis not present

## 2019-12-18 DIAGNOSIS — J1282 Pneumonia due to coronavirus disease 2019: Secondary | ICD-10-CM | POA: Diagnosis not present

## 2019-12-18 DIAGNOSIS — M109 Gout, unspecified: Secondary | ICD-10-CM | POA: Diagnosis not present

## 2019-12-18 DIAGNOSIS — Z6824 Body mass index (BMI) 24.0-24.9, adult: Secondary | ICD-10-CM | POA: Diagnosis not present

## 2019-12-18 DIAGNOSIS — E559 Vitamin D deficiency, unspecified: Secondary | ICD-10-CM | POA: Diagnosis not present

## 2019-12-18 DIAGNOSIS — E538 Deficiency of other specified B group vitamins: Secondary | ICD-10-CM | POA: Diagnosis not present

## 2019-12-18 DIAGNOSIS — H547 Unspecified visual loss: Secondary | ICD-10-CM | POA: Diagnosis not present

## 2019-12-18 DIAGNOSIS — I480 Paroxysmal atrial fibrillation: Secondary | ICD-10-CM | POA: Diagnosis not present

## 2019-12-18 DIAGNOSIS — U071 COVID-19: Secondary | ICD-10-CM | POA: Diagnosis not present

## 2019-12-18 DIAGNOSIS — K219 Gastro-esophageal reflux disease without esophagitis: Secondary | ICD-10-CM | POA: Diagnosis not present

## 2019-12-18 DIAGNOSIS — Z7951 Long term (current) use of inhaled steroids: Secondary | ICD-10-CM | POA: Diagnosis not present

## 2019-12-18 DIAGNOSIS — J45909 Unspecified asthma, uncomplicated: Secondary | ICD-10-CM | POA: Diagnosis not present

## 2019-12-18 DIAGNOSIS — M810 Age-related osteoporosis without current pathological fracture: Secondary | ICD-10-CM | POA: Diagnosis not present

## 2019-12-18 DIAGNOSIS — Z86718 Personal history of other venous thrombosis and embolism: Secondary | ICD-10-CM | POA: Diagnosis not present

## 2019-12-18 DIAGNOSIS — Z9181 History of falling: Secondary | ICD-10-CM | POA: Diagnosis not present

## 2019-12-18 DIAGNOSIS — Z96653 Presence of artificial knee joint, bilateral: Secondary | ICD-10-CM | POA: Diagnosis not present

## 2019-12-18 DIAGNOSIS — Z7901 Long term (current) use of anticoagulants: Secondary | ICD-10-CM | POA: Diagnosis not present

## 2019-12-18 DIAGNOSIS — I10 Essential (primary) hypertension: Secondary | ICD-10-CM | POA: Diagnosis not present

## 2019-12-18 DIAGNOSIS — M1990 Unspecified osteoarthritis, unspecified site: Secondary | ICD-10-CM | POA: Diagnosis not present

## 2019-12-18 DIAGNOSIS — Z7952 Long term (current) use of systemic steroids: Secondary | ICD-10-CM | POA: Diagnosis not present

## 2019-12-18 DIAGNOSIS — I2510 Atherosclerotic heart disease of native coronary artery without angina pectoris: Secondary | ICD-10-CM | POA: Diagnosis not present

## 2019-12-18 DIAGNOSIS — Z955 Presence of coronary angioplasty implant and graft: Secondary | ICD-10-CM | POA: Diagnosis not present

## 2019-12-20 DIAGNOSIS — Z96653 Presence of artificial knee joint, bilateral: Secondary | ICD-10-CM | POA: Diagnosis not present

## 2019-12-20 DIAGNOSIS — Z7951 Long term (current) use of inhaled steroids: Secondary | ICD-10-CM | POA: Diagnosis not present

## 2019-12-20 DIAGNOSIS — M109 Gout, unspecified: Secondary | ICD-10-CM | POA: Diagnosis not present

## 2019-12-20 DIAGNOSIS — I10 Essential (primary) hypertension: Secondary | ICD-10-CM | POA: Diagnosis not present

## 2019-12-20 DIAGNOSIS — K219 Gastro-esophageal reflux disease without esophagitis: Secondary | ICD-10-CM | POA: Diagnosis not present

## 2019-12-20 DIAGNOSIS — I2510 Atherosclerotic heart disease of native coronary artery without angina pectoris: Secondary | ICD-10-CM | POA: Diagnosis not present

## 2019-12-20 DIAGNOSIS — M810 Age-related osteoporosis without current pathological fracture: Secondary | ICD-10-CM | POA: Diagnosis not present

## 2019-12-20 DIAGNOSIS — I480 Paroxysmal atrial fibrillation: Secondary | ICD-10-CM | POA: Diagnosis not present

## 2019-12-20 DIAGNOSIS — E785 Hyperlipidemia, unspecified: Secondary | ICD-10-CM | POA: Diagnosis not present

## 2019-12-20 DIAGNOSIS — Z9181 History of falling: Secondary | ICD-10-CM | POA: Diagnosis not present

## 2019-12-20 DIAGNOSIS — J45909 Unspecified asthma, uncomplicated: Secondary | ICD-10-CM | POA: Diagnosis not present

## 2019-12-20 DIAGNOSIS — J1282 Pneumonia due to coronavirus disease 2019: Secondary | ICD-10-CM | POA: Diagnosis not present

## 2019-12-20 DIAGNOSIS — Z955 Presence of coronary angioplasty implant and graft: Secondary | ICD-10-CM | POA: Diagnosis not present

## 2019-12-20 DIAGNOSIS — E538 Deficiency of other specified B group vitamins: Secondary | ICD-10-CM | POA: Diagnosis not present

## 2019-12-20 DIAGNOSIS — H547 Unspecified visual loss: Secondary | ICD-10-CM | POA: Diagnosis not present

## 2019-12-20 DIAGNOSIS — E559 Vitamin D deficiency, unspecified: Secondary | ICD-10-CM | POA: Diagnosis not present

## 2019-12-20 DIAGNOSIS — M1990 Unspecified osteoarthritis, unspecified site: Secondary | ICD-10-CM | POA: Diagnosis not present

## 2019-12-20 DIAGNOSIS — Z86718 Personal history of other venous thrombosis and embolism: Secondary | ICD-10-CM | POA: Diagnosis not present

## 2019-12-20 DIAGNOSIS — Z7952 Long term (current) use of systemic steroids: Secondary | ICD-10-CM | POA: Diagnosis not present

## 2019-12-20 DIAGNOSIS — Z7901 Long term (current) use of anticoagulants: Secondary | ICD-10-CM | POA: Diagnosis not present

## 2019-12-20 DIAGNOSIS — Z6824 Body mass index (BMI) 24.0-24.9, adult: Secondary | ICD-10-CM | POA: Diagnosis not present

## 2019-12-20 DIAGNOSIS — U071 COVID-19: Secondary | ICD-10-CM | POA: Diagnosis not present

## 2019-12-23 DIAGNOSIS — Z7951 Long term (current) use of inhaled steroids: Secondary | ICD-10-CM | POA: Diagnosis not present

## 2019-12-23 DIAGNOSIS — Z96653 Presence of artificial knee joint, bilateral: Secondary | ICD-10-CM | POA: Diagnosis not present

## 2019-12-23 DIAGNOSIS — J1282 Pneumonia due to coronavirus disease 2019: Secondary | ICD-10-CM | POA: Diagnosis not present

## 2019-12-23 DIAGNOSIS — I480 Paroxysmal atrial fibrillation: Secondary | ICD-10-CM | POA: Diagnosis not present

## 2019-12-23 DIAGNOSIS — Z6824 Body mass index (BMI) 24.0-24.9, adult: Secondary | ICD-10-CM | POA: Diagnosis not present

## 2019-12-23 DIAGNOSIS — U071 COVID-19: Secondary | ICD-10-CM | POA: Diagnosis not present

## 2019-12-23 DIAGNOSIS — Z9181 History of falling: Secondary | ICD-10-CM | POA: Diagnosis not present

## 2019-12-23 DIAGNOSIS — M1990 Unspecified osteoarthritis, unspecified site: Secondary | ICD-10-CM | POA: Diagnosis not present

## 2019-12-23 DIAGNOSIS — I2510 Atherosclerotic heart disease of native coronary artery without angina pectoris: Secondary | ICD-10-CM | POA: Diagnosis not present

## 2019-12-23 DIAGNOSIS — K219 Gastro-esophageal reflux disease without esophagitis: Secondary | ICD-10-CM | POA: Diagnosis not present

## 2019-12-23 DIAGNOSIS — M810 Age-related osteoporosis without current pathological fracture: Secondary | ICD-10-CM | POA: Diagnosis not present

## 2019-12-23 DIAGNOSIS — Z86718 Personal history of other venous thrombosis and embolism: Secondary | ICD-10-CM | POA: Diagnosis not present

## 2019-12-23 DIAGNOSIS — I10 Essential (primary) hypertension: Secondary | ICD-10-CM | POA: Diagnosis not present

## 2019-12-23 DIAGNOSIS — E559 Vitamin D deficiency, unspecified: Secondary | ICD-10-CM | POA: Diagnosis not present

## 2019-12-23 DIAGNOSIS — H547 Unspecified visual loss: Secondary | ICD-10-CM | POA: Diagnosis not present

## 2019-12-23 DIAGNOSIS — Z7901 Long term (current) use of anticoagulants: Secondary | ICD-10-CM | POA: Diagnosis not present

## 2019-12-23 DIAGNOSIS — M109 Gout, unspecified: Secondary | ICD-10-CM | POA: Diagnosis not present

## 2019-12-23 DIAGNOSIS — E538 Deficiency of other specified B group vitamins: Secondary | ICD-10-CM | POA: Diagnosis not present

## 2019-12-23 DIAGNOSIS — Z955 Presence of coronary angioplasty implant and graft: Secondary | ICD-10-CM | POA: Diagnosis not present

## 2019-12-23 DIAGNOSIS — Z7952 Long term (current) use of systemic steroids: Secondary | ICD-10-CM | POA: Diagnosis not present

## 2019-12-23 DIAGNOSIS — J45909 Unspecified asthma, uncomplicated: Secondary | ICD-10-CM | POA: Diagnosis not present

## 2019-12-23 DIAGNOSIS — E785 Hyperlipidemia, unspecified: Secondary | ICD-10-CM | POA: Diagnosis not present

## 2019-12-26 ENCOUNTER — Encounter: Payer: Self-pay | Admitting: Family Medicine

## 2019-12-26 ENCOUNTER — Ambulatory Visit (INDEPENDENT_AMBULATORY_CARE_PROVIDER_SITE_OTHER): Payer: Medicare Other | Admitting: Family Medicine

## 2019-12-26 ENCOUNTER — Other Ambulatory Visit: Payer: Self-pay

## 2019-12-26 VITALS — BP 126/70 | HR 57 | Temp 97.8°F | Ht 65.0 in | Wt 214.1 lb

## 2019-12-26 DIAGNOSIS — L84 Corns and callosities: Secondary | ICD-10-CM | POA: Diagnosis not present

## 2019-12-26 DIAGNOSIS — U071 COVID-19: Secondary | ICD-10-CM | POA: Diagnosis not present

## 2019-12-26 DIAGNOSIS — R6889 Other general symptoms and signs: Secondary | ICD-10-CM | POA: Diagnosis not present

## 2019-12-26 DIAGNOSIS — Z23 Encounter for immunization: Secondary | ICD-10-CM | POA: Diagnosis not present

## 2019-12-26 MED ORDER — FLUTICASONE PROPIONATE 50 MCG/ACT NA SUSP
2.0000 | Freq: Every day | NASAL | 3 refills | Status: DC
Start: 2019-12-26 — End: 2021-01-26

## 2019-12-26 MED ORDER — FLUTICASONE PROPIONATE 50 MCG/ACT NA SUSP
2.0000 | Freq: Every day | NASAL | 3 refills | Status: DC
Start: 2019-12-26 — End: 2019-12-26

## 2019-12-26 NOTE — Patient Instructions (Addendum)
Try a soft insert for your shoes and avoid going barefoot.  See if that helps.  I don't seen anything other than the callous on your toe.   Don't change your meds for now.  Update me as needed.  Your lungs sound good.  I still expect you to make some gradual progress.  Good luck with the yard work, especially with the flowers.  Take care.  Glad to see you.  Recheck at a physical in the spring of 2022.

## 2019-12-26 NOTE — Progress Notes (Signed)
This visit occurred during the SARS-CoV-2 public health emergency.  Safety protocols were in place, including screening questions prior to the visit, additional usage of staff PPE, and extensive cleaning of exam room while observing appropriate contact time as indicated for disinfecting solutions.  She doesn't feel like she is back to pre-covid baseline but is making improvement.  She is done with therapy in the meantime, esp arm and leg strengthening to dec fall risk.  She is cooking and doing some chores now.  She can work for about 1 hour in the yard when it is cool.  Her cough is clearly better, d/w pt.  She doesn't notice noisy breathing.    R 1st toe pain.  Going on for about 1 week.  No trauma.  She has callous w/o ulceration on proximal portion of distal phalanx.  More pain barefoot.    She isn't lightheaded or dizzy.  No chest pain.    She needed a refill on flonase.  It had helped prev.    Reasonable to get flu shot today at [place], done at [place].    Meds, vitals, and allergies reviewed.   ROS: Per HPI unless specifically indicated in ROS section   GEN: nad, alert and oriented HEENT: ncat NECK: supple w/o LA CV: rrr PULM: ctab, no inc wob ABD: soft, +bs EXT: no edema SKIN: no acute rash R distal phalanx with callous w/o FB or ulceration.

## 2019-12-27 ENCOUNTER — Other Ambulatory Visit: Payer: Self-pay | Admitting: Family Medicine

## 2019-12-27 DIAGNOSIS — J45909 Unspecified asthma, uncomplicated: Secondary | ICD-10-CM | POA: Diagnosis not present

## 2019-12-27 DIAGNOSIS — K219 Gastro-esophageal reflux disease without esophagitis: Secondary | ICD-10-CM | POA: Diagnosis not present

## 2019-12-27 DIAGNOSIS — I480 Paroxysmal atrial fibrillation: Secondary | ICD-10-CM | POA: Diagnosis not present

## 2019-12-27 DIAGNOSIS — E559 Vitamin D deficiency, unspecified: Secondary | ICD-10-CM | POA: Diagnosis not present

## 2019-12-27 DIAGNOSIS — U071 COVID-19: Secondary | ICD-10-CM | POA: Diagnosis not present

## 2019-12-27 DIAGNOSIS — E538 Deficiency of other specified B group vitamins: Secondary | ICD-10-CM | POA: Diagnosis not present

## 2019-12-27 DIAGNOSIS — Z7901 Long term (current) use of anticoagulants: Secondary | ICD-10-CM | POA: Diagnosis not present

## 2019-12-27 DIAGNOSIS — E785 Hyperlipidemia, unspecified: Secondary | ICD-10-CM | POA: Diagnosis not present

## 2019-12-27 DIAGNOSIS — Z6824 Body mass index (BMI) 24.0-24.9, adult: Secondary | ICD-10-CM | POA: Diagnosis not present

## 2019-12-27 DIAGNOSIS — M109 Gout, unspecified: Secondary | ICD-10-CM | POA: Diagnosis not present

## 2019-12-27 DIAGNOSIS — Z86718 Personal history of other venous thrombosis and embolism: Secondary | ICD-10-CM | POA: Diagnosis not present

## 2019-12-27 DIAGNOSIS — I10 Essential (primary) hypertension: Secondary | ICD-10-CM | POA: Diagnosis not present

## 2019-12-27 DIAGNOSIS — M1990 Unspecified osteoarthritis, unspecified site: Secondary | ICD-10-CM | POA: Diagnosis not present

## 2019-12-27 DIAGNOSIS — J1282 Pneumonia due to coronavirus disease 2019: Secondary | ICD-10-CM | POA: Diagnosis not present

## 2019-12-27 DIAGNOSIS — Z955 Presence of coronary angioplasty implant and graft: Secondary | ICD-10-CM | POA: Diagnosis not present

## 2019-12-27 DIAGNOSIS — Z96653 Presence of artificial knee joint, bilateral: Secondary | ICD-10-CM | POA: Diagnosis not present

## 2019-12-27 DIAGNOSIS — I2510 Atherosclerotic heart disease of native coronary artery without angina pectoris: Secondary | ICD-10-CM | POA: Diagnosis not present

## 2019-12-27 DIAGNOSIS — Z7952 Long term (current) use of systemic steroids: Secondary | ICD-10-CM | POA: Diagnosis not present

## 2019-12-27 DIAGNOSIS — Z9181 History of falling: Secondary | ICD-10-CM | POA: Diagnosis not present

## 2019-12-27 DIAGNOSIS — H547 Unspecified visual loss: Secondary | ICD-10-CM | POA: Diagnosis not present

## 2019-12-27 DIAGNOSIS — M810 Age-related osteoporosis without current pathological fracture: Secondary | ICD-10-CM | POA: Diagnosis not present

## 2019-12-27 DIAGNOSIS — Z7951 Long term (current) use of inhaled steroids: Secondary | ICD-10-CM | POA: Diagnosis not present

## 2019-12-30 DIAGNOSIS — L84 Corns and callosities: Secondary | ICD-10-CM | POA: Insufficient documentation

## 2019-12-30 NOTE — Assessment & Plan Note (Signed)
Discussed options Try a soft insert for your shoes and avoid going barefoot.  Recheck at a physical in the spring of 2022.  Update me as needed in the meantime.

## 2019-12-30 NOTE — Assessment & Plan Note (Signed)
However improved.  Lungs are clear.  She will update me as needed.  I expect her to make some gradual progress.

## 2020-01-21 DIAGNOSIS — E782 Mixed hyperlipidemia: Secondary | ICD-10-CM | POA: Diagnosis not present

## 2020-01-21 DIAGNOSIS — I10 Essential (primary) hypertension: Secondary | ICD-10-CM | POA: Diagnosis not present

## 2020-01-21 DIAGNOSIS — I259 Chronic ischemic heart disease, unspecified: Secondary | ICD-10-CM | POA: Diagnosis not present

## 2020-01-21 DIAGNOSIS — I480 Paroxysmal atrial fibrillation: Secondary | ICD-10-CM | POA: Diagnosis not present

## 2020-01-30 ENCOUNTER — Telehealth: Payer: Self-pay

## 2020-01-30 NOTE — Telephone Encounter (Signed)
Called and spoke with patient regarding her current prescription of albuterol (Ventolin HFA) 108 (90 Base) MCG/ACT inhaler not being covered by her current insurance. Per [name], he wanted me to check with her to see which of the following medications: Albuterol HFA (Proair), Proair HFA, or Proair Respiclick, she would like to use, which are covered by her insurance. Patient verbalized, "It doesn't matter. Whatever [name] wants to put me on."

## 2020-01-31 MED ORDER — ALBUTEROL SULFATE HFA 108 (90 BASE) MCG/ACT IN AERS: 2.0000 | INHALATION_SPRAY | Freq: Four times a day (QID) | RESPIRATORY_TRACT | 3 refills | Status: DC | PRN

## 2020-01-31 NOTE — Telephone Encounter (Signed)
I sent rx for proair.  Thanks.

## 2020-01-31 NOTE — Addendum Note (Signed)
Addended by: [name] on: 01/31/2020 07:46 AM   Modules accepted: Orders

## 2020-02-28 ENCOUNTER — Other Ambulatory Visit: Payer: Self-pay | Admitting: Family Medicine

## 2020-02-28 NOTE — Telephone Encounter (Signed)
Please Advise

## 2020-03-01 NOTE — Telephone Encounter (Signed)
Sent. Thanks.   

## 2020-04-01 ENCOUNTER — Other Ambulatory Visit: Payer: Self-pay

## 2020-04-01 MED ORDER — LANSOPRAZOLE 30 MG PO CPDR
30.0000 mg | DELAYED_RELEASE_CAPSULE | Freq: Two times a day (BID) | ORAL | 1 refills | Status: DC
Start: 2020-04-01 — End: 2020-04-17

## 2020-04-01 MED ORDER — SERTRALINE HCL 100 MG PO TABS
ORAL_TABLET | ORAL | 0 refills | Status: DC
Start: 2020-04-01 — End: 2020-04-17

## 2020-04-02 ENCOUNTER — Other Ambulatory Visit: Payer: Self-pay | Admitting: Family Medicine

## 2020-04-09 ENCOUNTER — Telehealth: Payer: Self-pay | Admitting: *Deleted

## 2020-04-09 DIAGNOSIS — I10 Essential (primary) hypertension: Secondary | ICD-10-CM

## 2020-04-09 NOTE — Telephone Encounter (Signed)
Pt called Triage asking for help with her meds. Pt said someone from her insurance called to try and help her set up mail order pharmacy but it was a little difficult for pt to understand, and they were wanting the 1st payment up front from all of her meds which was to expensive for pt, she decided she didn't want to do mail order anymore, but pt said it is very difficult for pt to get meds from [facility] because she has to pay someone to get her meds each time and she is to the point where she is just "going to stop taking them" if she can't get help. I know the pharmacist Sharyn Lull and [facility] may deliver but not sure if they do or if she qualifies or what is the process but pt is in need of some help with managing/ getting meds.  Pt said she is almost out of her eliquis and is out of her xanax and is asking Rxs to be sent to [place] for now. And she will just figure out how to get it. Looks like xanax has refill on file but pt is confused about how to get it and eliquis says "no print" on 04/11/19, please review   Will route to PCP and CMA

## 2020-04-10 ENCOUNTER — Telehealth: Payer: Self-pay | Admitting: Family Medicine

## 2020-04-10 MED ORDER — APIXABAN 5 MG PO TABS
5.0000 mg | ORAL_TABLET | Freq: Two times a day (BID) | ORAL | 5 refills | Status: DC
Start: 2020-04-10 — End: 2020-05-20

## 2020-04-10 MED ORDER — ALPRAZOLAM 0.5 MG PO TABS
ORAL_TABLET | ORAL | 2 refills | Status: DC
Start: 2020-04-10 — End: 2020-09-01

## 2020-04-10 NOTE — Telephone Encounter (Signed)
I sent both refills.  Can you please help this patient otherwise set of her medications?

## 2020-04-10 NOTE — Progress Notes (Signed)
°  Chronic Care Management   Outreach Note  04/10/2020 Name: [name] MRN: [id number] DOB: 1935-06-30  Referred by: [name], MD Reason for referral : Chronic Care Management   An unsuccessful telephone outreach was attempted today. The patient was referred to the pharmacist for assistance with care management and care coordination.   Follow Up Plan:   Hilario Quarry  [facility] Scheduler

## 2020-04-10 NOTE — Telephone Encounter (Signed)
Spoke with patient the the requested medications she needed were sent in this morning already. Patient is to speak with the pharmacist once referral is done and scheduled. Patient awaiting call to schedule.

## 2020-04-10 NOTE — Telephone Encounter (Signed)
Spoke with patient and she is okay with CCM referral and knows someone will call her to schedule appt once referral is placed.

## 2020-04-10 NOTE — Telephone Encounter (Signed)
Sent to scheduling team. Please place CCM referral [phone].  Thanks!  [name], PharmD Clinical Pharmacist [facility] at [facility] [phone]

## 2020-04-10 NOTE — Telephone Encounter (Signed)
Pt called in wanted to get a script to go to home delivery.

## 2020-04-12 NOTE — Telephone Encounter (Signed)
I put in the referral.  Thanks.  

## 2020-04-13 ENCOUNTER — Telehealth: Payer: Self-pay

## 2020-04-13 MED ORDER — METOPROLOL TARTRATE 25 MG PO TABS: 25.0000 mg | ORAL_TABLET | Freq: Two times a day (BID) | ORAL | 0 refills | Status: DC

## 2020-04-13 MED ORDER — ATORVASTATIN CALCIUM 80 MG PO TABS: 80.0000 mg | ORAL_TABLET | Freq: Every day | ORAL | 0 refills | Status: DC

## 2020-04-13 MED ORDER — DILTIAZEM HCL ER COATED BEADS 180 MG PO CP24: 180.0000 mg | ORAL_CAPSULE | Freq: Every day | ORAL | 0 refills | Status: DC

## 2020-04-13 MED ORDER — ISOSORBIDE MONONITRATE ER 30 MG PO TB24: 30.0000 mg | ORAL_TABLET | Freq: Every day | ORAL | 0 refills | Status: DC

## 2020-04-13 NOTE — Telephone Encounter (Signed)
Pharmacy requests refill on: Metoprolol Succinate ER 25 mg   LAST REFILL: 12/26/2019 LAST OV: 12/26/2019 NEXT OV: Not Scheduled  PHARMACY: [place] requests refill on: Diltiazem 180 mg 24 hr    LAST REFILL: 10/14/2019 LAST OV: 12/26/2019 NEXT OV: Not Scheduled  PHARMACY: [facility]

## 2020-04-13 NOTE — Telephone Encounter (Signed)
Pharmacy requests refill on: Isosorbide Mononitrate 30 mg 24 hr   LAST REFILL:  LAST OV: 12/26/2019 NEXT OV: Not Scheduled  PHARMACY: [facility]  Pharmacy requests refill on: Atorvastatin 80 mg   LAST REFILL: 09/09/2018 LAST OV: 12/26/2019 NEXT OV: Not Scheduled  PHARMACY: [facility]

## 2020-04-13 NOTE — Addendum Note (Signed)
Addended by: Ronna Polio on: 04/13/2020 08:51 AM   Modules accepted: Orders

## 2020-04-15 ENCOUNTER — Telehealth: Payer: Self-pay | Admitting: Family Medicine

## 2020-04-15 NOTE — Chronic Care Management (AMB) (Signed)
  Chronic Care Management   Note  04/15/2020 Name: [name] MRN: [id number] DOB: 1935/06/17  [name] is a 85 y.o. year old female who is a primary care patient of [name], MD. I reached out to [name] by phone today in response to a referral sent by [name]'s PCP, [name], MD.   [name] was given information about Chronic Care Management services today including:  1. CCM service includes personalized support from designated clinical staff supervised by her physician, including individualized plan of care and coordination with other care providers 2. 24/7 contact phone numbers for assistance for urgent and routine care needs. 3. Service will only be billed when office clinical staff spend 20 minutes or more in a month to coordinate care. 4. Only one practitioner may furnish and bill the service in a calendar month. 5. The patient may stop CCM services at any time (effective at the end of the month) by phone call to the office staff.   Miguel Dibble verbally agreed to assistance and services provided by embedded care coordination/care management team today.  Follow up plan:   [place]

## 2020-04-16 ENCOUNTER — Other Ambulatory Visit: Payer: Self-pay | Admitting: Family Medicine

## 2020-04-17 ENCOUNTER — Other Ambulatory Visit: Payer: Self-pay

## 2020-04-20 ENCOUNTER — Ambulatory Visit: Payer: Medicare Other | Admitting: Family Medicine

## 2020-04-21 ENCOUNTER — Ambulatory Visit: Payer: Self-pay

## 2020-04-23 ENCOUNTER — Emergency Department (HOSPITAL_COMMUNITY)
Admission: EM | Admit: 2020-04-23 | Discharge: 2020-04-23 | Disposition: A | Discharge status: Home / Self Care | Payer: Medicare Other | Attending: Emergency Medicine | Admitting: Emergency Medicine

## 2020-04-23 ENCOUNTER — Other Ambulatory Visit: Payer: Self-pay

## 2020-04-23 ENCOUNTER — Encounter: Payer: Self-pay | Admitting: Family Medicine

## 2020-04-23 ENCOUNTER — Ambulatory Visit (INDEPENDENT_AMBULATORY_CARE_PROVIDER_SITE_OTHER): Payer: Medicare Other | Admitting: Family Medicine

## 2020-04-23 ENCOUNTER — Emergency Department (HOSPITAL_COMMUNITY): Payer: Medicare Other

## 2020-04-23 VITALS — BP 130/62 | HR 58 | Temp 97.6°F | Ht 65.0 in | Wt 223.0 lb

## 2020-04-23 DIAGNOSIS — R600 Localized edema: Secondary | ICD-10-CM | POA: Insufficient documentation

## 2020-04-23 DIAGNOSIS — G319 Degenerative disease of nervous system, unspecified: Secondary | ICD-10-CM | POA: Insufficient documentation

## 2020-04-23 DIAGNOSIS — Z7901 Long term (current) use of anticoagulants: Secondary | ICD-10-CM | POA: Insufficient documentation

## 2020-04-23 DIAGNOSIS — H539 Unspecified visual disturbance: Secondary | ICD-10-CM

## 2020-04-23 DIAGNOSIS — G459 Transient cerebral ischemic attack, unspecified: Secondary | ICD-10-CM | POA: Insufficient documentation

## 2020-04-23 DIAGNOSIS — Z79899 Other long term (current) drug therapy: Secondary | ICD-10-CM | POA: Diagnosis not present

## 2020-04-23 DIAGNOSIS — Z8616 Personal history of COVID-19: Secondary | ICD-10-CM | POA: Diagnosis not present

## 2020-04-23 DIAGNOSIS — Z743 Need for continuous supervision: Secondary | ICD-10-CM | POA: Diagnosis not present

## 2020-04-23 DIAGNOSIS — Z86718 Personal history of other venous thrombosis and embolism: Secondary | ICD-10-CM | POA: Diagnosis not present

## 2020-04-23 DIAGNOSIS — H547 Unspecified visual loss: Secondary | ICD-10-CM | POA: Diagnosis present

## 2020-04-23 DIAGNOSIS — I2510 Atherosclerotic heart disease of native coronary artery without angina pectoris: Secondary | ICD-10-CM | POA: Insufficient documentation

## 2020-04-23 DIAGNOSIS — R0902 Hypoxemia: Secondary | ICD-10-CM | POA: Diagnosis not present

## 2020-04-23 DIAGNOSIS — J45909 Unspecified asthma, uncomplicated: Secondary | ICD-10-CM | POA: Diagnosis not present

## 2020-04-23 DIAGNOSIS — I6782 Cerebral ischemia: Secondary | ICD-10-CM | POA: Insufficient documentation

## 2020-04-23 DIAGNOSIS — R001 Bradycardia, unspecified: Secondary | ICD-10-CM | POA: Diagnosis not present

## 2020-04-23 DIAGNOSIS — I10 Essential (primary) hypertension: Secondary | ICD-10-CM | POA: Insufficient documentation

## 2020-04-23 DIAGNOSIS — Z96653 Presence of artificial knee joint, bilateral: Secondary | ICD-10-CM | POA: Insufficient documentation

## 2020-04-23 DIAGNOSIS — R9082 White matter disease, unspecified: Secondary | ICD-10-CM | POA: Diagnosis not present

## 2020-04-23 DIAGNOSIS — I499 Cardiac arrhythmia, unspecified: Secondary | ICD-10-CM | POA: Diagnosis not present

## 2020-04-23 DIAGNOSIS — R531 Weakness: Secondary | ICD-10-CM | POA: Diagnosis not present

## 2020-04-23 LAB — COMPREHENSIVE METABOLIC PANEL
ALT: 12 U/L (ref 0–44)
AST: 20 U/L (ref 15–41)
Albumin: 3.1 g/dL — ABNORMAL LOW (ref 3.5–5.0)
Alkaline Phosphatase: 71 U/L (ref 38–126)
Anion gap: 10 (ref 5–15)
BUN: 12 mg/dL (ref 8–23)
CO2: 25 mmol/L (ref 22–32)
Calcium: 8.6 mg/dL — ABNORMAL LOW (ref 8.9–10.3)
Chloride: 105 mmol/L (ref 98–111)
Creatinine, Ser: 0.9 mg/dL (ref 0.44–1.00)
GFR, Estimated: >60 mL/min (ref >60)
Glucose, Bld: 92 mg/dL (ref 70–99)
Potassium: 4.1 mmol/L (ref 3.5–5.1)
Sodium: 140 mmol/L (ref 135–145)
Total Bilirubin: 0.6 mg/dL (ref 0.3–1.2)
Total Protein: 6.2 g/dL — ABNORMAL LOW (ref 6.5–8.1)

## 2020-04-23 LAB — URINALYSIS, ROUTINE W REFLEX MICROSCOPIC
Bilirubin Urine: NEGATIVE
Glucose, UA: NEGATIVE mg/dL
Hgb urine dipstick: NEGATIVE
Ketones, ur: NEGATIVE mg/dL
Leukocytes,Ua: NEGATIVE
Nitrite: NEGATIVE
Protein, ur: NEGATIVE mg/dL
Specific Gravity, Urine: 1.015 (ref 1.005–1.030)
pH: 6 (ref 5.0–8.0)

## 2020-04-23 LAB — DIFFERENTIAL
Abs Immature Granulocytes: 0.02 10*3/uL (ref 0.00–0.07)
Basophils Absolute: 0 10*3/uL (ref 0.0–0.1)
Basophils Relative: 0 %
Eosinophils Absolute: 0.1 10*3/uL (ref 0.0–0.5)
Eosinophils Relative: 1 %
Immature Granulocytes: 0 %
Lymphocytes Relative: 32 %
Lymphs Abs: 2.1 10*3/uL (ref 0.7–4.0)
Monocytes Absolute: 0.4 10*3/uL (ref 0.1–1.0)
Monocytes Relative: 7 %
Neutro Abs: 4 10*3/uL (ref 1.7–7.7)
Neutrophils Relative %: 60 %

## 2020-04-23 LAB — CBG MONITORING, ED: Glucose-Capillary: 93 mg/dL (ref 70–99)

## 2020-04-23 LAB — CBC
HCT: 34 % — ABNORMAL LOW (ref 36.0–46.0)
Hemoglobin: 11 g/dL — ABNORMAL LOW (ref 12.0–15.0)
MCH: 28.8 pg (ref 26.0–34.0)
MCHC: 32.4 g/dL (ref 30.0–36.0)
MCV: 89 fL (ref 80.0–100.0)
Platelets: 178 10*3/uL (ref 150–400)
RBC: 3.82 MIL/uL — ABNORMAL LOW (ref 3.87–5.11)
RDW: 13.2 % (ref 11.5–15.5)
WBC: 6.7 10*3/uL (ref 4.0–10.5)
nRBC: 0 % (ref 0.0–0.2)

## 2020-04-23 LAB — PROTIME-INR
INR: 1.3 — ABNORMAL HIGH (ref 0.8–1.2)
Prothrombin Time: 15.4 seconds — ABNORMAL HIGH (ref 11.4–15.2)

## 2020-04-23 LAB — APTT: aPTT: 43 seconds — ABNORMAL HIGH (ref 24–36)

## 2020-04-23 LAB — RAPID URINE DRUG SCREEN, HOSP PERFORMED
Amphetamines: NOT DETECTED
Barbiturates: NOT DETECTED
Benzodiazepines: POSITIVE — AB
Cocaine: NOT DETECTED
Opiates: POSITIVE — AB
Tetrahydrocannabinol: NOT DETECTED

## 2020-04-23 LAB — ETHANOL: Alcohol, Ethyl (B): <10 mg/dL (ref <10)

## 2020-04-23 MED ORDER — TETRACAINE HCL 0.5 % OP SOLN
2.0000 [drp] | Freq: Once | OPHTHALMIC | Status: AC
  Administered 2020-04-23: 2 [drp] via OPHTHALMIC
  Filled 2020-04-23: qty 4

## 2020-04-23 MED ORDER — FLUORESCEIN SODIUM 1 MG OP STRP
1.0000 | ORAL_STRIP | Freq: Once | OPHTHALMIC | Status: AC
  Administered 2020-04-23: 1 via OPHTHALMIC
  Filled 2020-04-23: qty 1

## 2020-04-23 NOTE — Discharge Instructions (Addendum)
You were seen in the emergency department for intermittent visual disturbance.  You had a CAT scan of your head that did not show an obvious stroke.  You also had blood work.  It will be important for you to follow-up with your eye doctor as scheduled.  We also contacted Dr. Baird Cancer who is on-call for [place] today and they may also reach out to you.  Return to the emergency department for any worsening or concerning symptoms

## 2020-04-23 NOTE — Progress Notes (Signed)
This visit occurred during the SARS-CoV-2 public health emergency.  Safety protocols were in place, including screening questions prior to the visit, additional usage of staff PPE, and extensive cleaning of exam room while observing appropriate contact time as indicated for disinfecting solutions.  Memory concern.  Family was worried about her memory.  D/w pt.  She is having some memory lapses.  No red flag events.  Safety d/w pt.    She has noted some vision changes and was planning to f/u with the eye clinic.  She has noted occ brief loss of vision in L field with either eye.  Most recently happened two days ago, lasted a few minutes.  Has been going on episodically for 6-8 months but more often recently.   Then (in the midst of the interview, after she had been roomed and after we had already begun talking) she has episode that started here at the clinic today.  Sx started 1055, nearly resolved by 1104.  She had L sided vision loss then blurriness in the L fields, then resolved.  No speech or motor changes at the time.    On eliquis at baseline.    Meds, vitals, and allergies reviewed.   ROS: Per HPI unless specifically indicated in ROS section   GEN: nad, alert and oriented HEENT: ncat, PERRL, EOMI NECK: supple w/o LA CV: rrr.   PULM: ctab, no inc wob ABD: soft, +bs EXT: trace BLE edema SKIN: well perfused.   CN 2-12 wnl B, S/S wnl x4 B fundus exam w/o acute changes seen- nondilated.    EMS called, in route.   D/w pt about transport.  She consents.  Daughter aware at the [place].

## 2020-04-23 NOTE — ED Notes (Signed)
Patient transported to CT 

## 2020-04-23 NOTE — ED Triage Notes (Addendum)
Pt was at primary care today for normal visit when she lost vision in left eye. Sx started at 1050 and stopped at 1103. Upon EMS arrival pt asymptomatic and states vision loss intermitting for 6-8 months. No deficits noted at this time,  pt is A/o x 3. Pt stating at this time feels like something is in left eye. Redness to left eye

## 2020-04-23 NOTE — Patient Instructions (Signed)
Let me see about getting your eye clinic moved up.
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
## 2020-04-23 NOTE — Chronic Care Management (AMB) (Signed)
Attempted to contact patient to assist with medication coordination and delivery per PCP referral. Left VM with contact information on 2/8 and 2/9.  [name], PharmD Clinical Pharmacist [facility] at [facility] [phone]

## 2020-04-23 NOTE — ED Notes (Signed)
Pt d.c by MD and is provided w/ d/c instructions and follow up care, pt is out of the ED in wheelchair by self

## 2020-04-26 DIAGNOSIS — H539 Unspecified visual disturbance: Secondary | ICD-10-CM | POA: Insufficient documentation

## 2020-04-26 NOTE — Assessment & Plan Note (Signed)
Limited funduscopic exam was unremarkable but given the acute change we needed to get her transported emergently to the ER and this was done.  See above.  Case discussed with EMS and patient was transported as soon as possible.  She did not have acute speech or motor changes otherwise.  No chest pain.

## 2020-04-27 DIAGNOSIS — H43391 Other vitreous opacities, right eye: Secondary | ICD-10-CM | POA: Diagnosis not present

## 2020-04-27 DIAGNOSIS — H43813 Vitreous degeneration, bilateral: Secondary | ICD-10-CM | POA: Diagnosis not present

## 2020-04-27 DIAGNOSIS — H353211 Exudative age-related macular degeneration, right eye, with active choroidal neovascularization: Secondary | ICD-10-CM | POA: Diagnosis not present

## 2020-04-27 DIAGNOSIS — H353122 Nonexudative age-related macular degeneration, left eye, intermediate dry stage: Secondary | ICD-10-CM | POA: Diagnosis not present

## 2020-05-12 NOTE — Progress Notes (Signed)
Spoke with patient by telephone on 2/15 to discuss medication concerns. Per PCP, patient was having difficulty accessing medications due to transportation and coordination of refills. Pt reports she is now not having any difficulty and will call if she has trouble. She switched back to mail order from [place], PharmD Clinical Pharmacist [facility] at [facility] [phone]

## 2020-05-18 ENCOUNTER — Telehealth: Payer: Self-pay

## 2020-05-18 NOTE — Telephone Encounter (Addendum)
[name] contacted me by telephone today. She reports she called in her meds to mail order but has not received them. Reports the rx needed to be approved by [name] first. This is the third time she has contacted me confused about medication delivery and not having the meds she needs. I think she would benefit from [facility] coordination.  Offered patient delivery services through [facility]. Patient is currently using [facility] and [facility]. She would like to utilize [facility] for med coordination, sync, and delivery.   Verbal consent obtained for [facility] enhanced pharmacy services (medication synchronization, adherence packaging, delivery coordination). A medication sync plan was created to allow patient to get all medications delivered once every 30 to 90 days per patient preference. Patient understands they have freedom to choose pharmacy and clinical pharmacist will coordinate care between all prescribers and [facility].  Start with vials - 90 DS, easy open caps. Then consider adherence packaging once synced.    She needs the following medications (none on hand): Albuterol inhaler, Flovent inhaler, Eliquis, sertraline, clonidine, alprazolam, lanzoprazole, flonase nasal spray   Does not need: atorvastatin (sent 04/21/20 90DS), isosorbide mononitrate (recently delivered - recently delivered pt pt, unknown amount), diltiazem ER 190 mg (recently delivered per pt, unknown amount), metoprolol (04/21/20 30 DS), vitamin C, vitamin D3, vitamin B - picks up OTC, Flexeril (uses PRN)  Will coordinate delivery and sync plan through [place], PharmD Clinical Pharmacist [facility] at [facility] [phone]

## 2020-05-19 ENCOUNTER — Telehealth: Payer: Self-pay

## 2020-05-19 NOTE — Chronic Care Management (AMB) (Signed)
After review of patients medications there were several pharmacies on record. [facility] in [place], [facility] in [place], [facility] and [facility] were all contacted to request patients medication profile be transferred. Upon receipt of medication transfers delivery will be set up ASAP. Patient still should pick up Eliquis from [place].    Follow-Up:  Coordination of Enhanced Pharmacy Services and Pharmacist Review  [name], CPP notified  [name], [place] [phone]  Total time spent for month: 1:06:00

## 2020-05-19 NOTE — Chronic Care Management (AMB) (Signed)
Started new note in error.

## 2020-05-19 NOTE — Telephone Encounter (Signed)
Adherence review -   Patient never started clonidine. Per [place], she never picked up clonidine sent on 04/17/20 and she does not have any on hand per her personal report. No prior history of clonidine use. Will hold off on sending clonidine from [facility] until able to assess home BP. BP has been stable/good control prior to isolated elevation in ED on 2/10 for vision disturbance.   Office blood pressures are: BP Readings from Last 3 Encounters:  04/23/20 (!) 167/67  04/23/20 130/62  12/26/19 126/70   [name], PharmD Clinical Pharmacist [place] [facility] at [facility] [phone]

## 2020-05-19 NOTE — Chronic Care Management (AMB) (Signed)
Contacted [facility] to request medication transfer in order to coordinate patient's medications for delivery, med sync, and adherence packaging from [facility]. They had patient's Eliquis ready for pick up. Attempted to contact patient to review timing of medications and to  encourage her to pick up her Eliquis from [place] since she states she is out. Had to leave message for return call.  Onboard form started - awaiting patient's return call to complete form.   Follow-Up:  Coordination of Enhanced Pharmacy Services and Pharmacist Review  [name], CPP notified  [name], [place] [phone]  Total time spent for month: 29

## 2020-05-20 ENCOUNTER — Telehealth: Payer: Self-pay

## 2020-05-20 MED ORDER — APIXABAN 5 MG PO TABS: 5.0000 mg | ORAL_TABLET | Freq: Two times a day (BID) | ORAL | 5 refills | Status: DC

## 2020-05-20 NOTE — Addendum Note (Signed)
Addended by: [name] B on: 05/20/2020 09:08 AM   Modules accepted: Orders

## 2020-05-20 NOTE — Telephone Encounter (Signed)
-----   Message from [place], [facility] sent at 05/19/2020  8:07 PM EST ----- Regarding: Eliquis Refill Would you please send a Eliquis refill to [facility]? Pt is without medication and unable to get transfer from [place] at this time.  [name], PharmD Clinical Pharmacist [facility] [facility] at [facility] [phone]

## 2020-05-20 NOTE — Telephone Encounter (Signed)
ERX sent.  

## 2020-05-27 DIAGNOSIS — R6889 Other general symptoms and signs: Secondary | ICD-10-CM | POA: Diagnosis not present

## 2020-05-27 DIAGNOSIS — H353122 Nonexudative age-related macular degeneration, left eye, intermediate dry stage: Secondary | ICD-10-CM | POA: Diagnosis not present

## 2020-05-27 DIAGNOSIS — H353211 Exudative age-related macular degeneration, right eye, with active choroidal neovascularization: Secondary | ICD-10-CM | POA: Diagnosis not present

## 2020-05-28 ENCOUNTER — Other Ambulatory Visit: Payer: Self-pay

## 2020-05-28 ENCOUNTER — Ambulatory Visit (INDEPENDENT_AMBULATORY_CARE_PROVIDER_SITE_OTHER): Payer: Medicare Other

## 2020-05-28 DIAGNOSIS — E785 Hyperlipidemia, unspecified: Secondary | ICD-10-CM | POA: Diagnosis not present

## 2020-05-28 DIAGNOSIS — I10 Essential (primary) hypertension: Secondary | ICD-10-CM

## 2020-05-28 NOTE — Progress Notes (Addendum)
Chronic Care Management Pharmacy Note  06/08/2020 Name:  [name] MRN:  [id number] DOB:  1935/12/07  Subjective: [name] is an 85 y.o. year old female who is a primary patient of Damita Dunnings, Elveria Rising, MD.  The CCM team was consulted for assistance with disease management and care coordination needs.    Engaged with patient by telephone for initial visit in response to provider referral for pharmacy case management and/or care coordination services.   Consent to Services:  The patient was given the following information about Chronic Care Management services today, agreed to services, and gave verbal consent: 1. CCM service includes personalized support from designated clinical staff supervised by the primary care provider, including individualized plan of care and coordination with other care providers 2. 24/7 contact phone numbers for assistance for urgent and routine care needs. 3. Service will only be billed when office clinical staff spend 20 minutes or more in a month to coordinate care. 4. Only one practitioner may furnish and bill the service in a calendar month. 5.The patient may stop CCM services at any time (effective at the end of the month) by phone call to the office staff. 6. The patient will be responsible for cost sharing (co-pay) of up to 20% of the service fee (after annual deductible is met). Patient agreed to services and consent obtained.  Patient Care Team: [name], MD as PCP - General (Family Medicine) Ubaldo Glassing Javier Docker, MD as Consulting Physician (Cardiology) Birder Robson, MD as Referring Physician (Ophthalmology) [name], [facility] as Pharmacist (Pharmacist)  Recent office visits: 04/24/19 - PCP - Visual disturbances, move eye appt up    Recent consult visits: 01/21/20 - Cardiology - Continue current medications  12/27/19 - [facility] visits: ED - Vision disturbance  Objective:  Lab Results  Component Value Date    CREATININE 0.90 04/23/2020   BUN 12 04/23/2020   GFR 86.86 12/03/2019   GFRNONAA >60 04/23/2020   GFRAA >60 11/21/2019   NA 140 04/23/2020   K 4.1 04/23/2020   CALCIUM 8.6 (L) 04/23/2020   CO2 25 04/23/2020   GLUCOSE 92 04/23/2020    Lab Results  Component Value Date/Time   HGBA1C 5.9 10/17/2011 11:51 AM   GFR 86.86 12/03/2019 10:30 AM   GFR 84.59 09/06/2018 11:10 AM   MICROALBUR 0.3 06/22/2009 08:35 AM   MICROALBUR 1.2 09/18/2008 09:48 AM     Lab Results  Component Value Date   CHOL 150 09/06/2018   HDL 52.00 09/06/2018   LDLCALC 80 09/06/2018   LDLDIRECT 94.7 01/30/2014   TRIG 87.0 09/06/2018   CHOLHDL 3 09/06/2018    Hepatic Function Latest Ref Rng & Units 04/23/2020 12/03/2019 11/21/2019  Total Protein 6.5 - 8.1 g/dL 6.2(L) 6.1 6.6  Albumin 3.5 - 5.0 g/dL 3.1(L) 3.1(L) 3.0(L)  AST 15 - 41 U/L '20 14 27  ' ALT 0 - 44 U/L '12 10 14  ' Alk Phosphatase 38 - 126 U/L 71 69 59  Total Bilirubin 0.3 - 1.2 mg/dL 0.6 0.6 1.0  Bilirubin, Direct 0.0 - 0.2 mg/dL - - -    Lab Results  Component Value Date/Time   TSH 1.54 09/06/2018 11:10 AM   TSH 1.47 12/05/2016 01:37 PM    CBC Latest Ref Rng & Units 04/23/2020 12/03/2019 11/21/2019  WBC 4.0 - 10.5 K/uL 6.7 8.2 2.9(L)  Hemoglobin 12.0 - 15.0 g/dL 11.0(L) 12.1 11.9(L)  Hematocrit 36.0 - 46.0 % 34.0(L) 36.3 35.7(L)  Platelets 150 -  400 K/uL 178 188.0 166    Lab Results  Component Value Date/Time   VD25OH 45.28 09/06/2018 11:10 AM   VD25OH 42.92 09/01/2016 12:47 PM    Clinical ASCVD: Yes  The ASCVD Risk score Mikey Bussing DC Jr., et al., 2013) failed to calculate for the following reasons:   The 2013 ASCVD risk score is only valid for ages 36 to 38    Depression screen PHQ 2/9 09/08/2016 03/20/2012  Decreased Interest 1 0  Down, Depressed, Hopeless 3 0  PHQ - 2 Score 4 0  Altered sleeping 0 -  Tired, decreased energy 3 -  Change in appetite 1 -  Feeling bad or failure about yourself  0 -  Trouble concentrating 0 -  Moving slowly or  fidgety/restless 0 -  Suicidal thoughts 0 -  PHQ-9 Score 8 -  Difficult doing work/chores Not difficult at all -  Some recent data might be hidden     Social History   Tobacco Use  Smoking Status Never Smoker  Smokeless Tobacco Never Used   BP Readings from Last 3 Encounters:  04/23/20 (!) 167/67  04/23/20 130/62  12/26/19 126/70   Pulse Readings from Last 3 Encounters:  04/23/20 (!) 48  04/23/20 (!) 58  12/26/19 (!) 57   Wt Readings from Last 3 Encounters:  04/23/20 223 lb (101.2 kg)  12/26/19 214 lb 2 oz (97.1 kg)  11/19/19 214 lb 1.1 oz (97.1 kg)   BMI Readings from Last 3 Encounters:  04/23/20 35.99 kg/m  04/23/20 37.11 kg/m  12/26/19 35.63 kg/m    Assessment/Interventions: Review of patient past medical history, allergies, medications, health status, including review of consultants reports, laboratory and other test data, was performed as part of comprehensive evaluation and provision of chronic care management services.   SDOH:  (Social Determinants of Health) assessments and interventions performed: Yes SDOH Interventions   Flowsheet Row Most Recent Value  SDOH Interventions   Financial Strain Interventions Intervention Not Indicated  [Meds affordable]      CCM Care Plan  Allergies  Allergen Reactions  . Amoxicillin Swelling    Face hands  . Penicillins Swelling and Other (See Comments)    Has patient had a PCN reaction causing immediate rash, facial/tongue/throat swelling, SOB or lightheadedness with hypotension: Yes Has patient had a PCN reaction causing severe rash involving mucus membranes or skin necrosis: No Has patient had a PCN reaction that required hospitalization No phalosporin use.  . Valium [Diazepam] Other (See Comments)    Pt had shakes  . Imdur [Isosorbide Nitrate]     Headache at 7m dose    Medications Reviewed Today    Reviewed by [facility]ebbora Dus [facility](Ph[facility]ist) on 05/28/20 at 1MeadowlandsList Status: <None>  Medication  Order Taking? Sig Documenting Provider Last Dose Status Informant  albuterol (VENTOLIN HFA) 108 (90 Base) MCG/ACT inhaler [id number] Inhale 2 puffs by mouth every 6 hours as needed for wheezing/shortness of breath DTonia Ghent MD Taking Active   ALPRAZolam (Duanne Moron 0.5 MG tablet [id number] TAKE 1 TABLET BY MOUTH ONCE DAILY AS NEEDED FOR ANXIETY DTonia Ghent MD Taking Active   apixaban (ELIQUIS) 5 MG TABS tablet [id number] Take 1 tablet (5 mg total) by mouth 2 (two) times daily. DTonia Ghent MD Taking Active   ascorbic acid (VITAMIN C) 500 MG tablet [id number] Take 1 tablet (500 mg total) by mouth daily. ALorella Nimrod MD Taking Active   atorvastatin (LIPITOR) 80 MG tablet [id number]  Yes Take 1 tablet (80 mg total) by mouth daily. [name], MD Taking Active   Cholecalciferol (VITAMIN D) 2000 units CAPS [id number] Yes Take 2,000 Units by mouth daily. [provider] Taking Active Family Member  cloNIDine (CATAPRES) 0.3 MG tablet [id number] No TAKE 1 TABLET BY MOUTH THREE TIMES DAILY  Patient not taking: Reported on 05/28/2020   [name], MD Not Taking Active   cyclobenzaprine (FLEXERIL) 5 MG tablet [id number] Yes TAKE 1 TABLET BY MOUTH THREE TIMES DAILY AS NEEDED FOR MUSCLE SPASM *SEDATION CAUTION* [name], MD Taking Active Family Member  diltiazem (CARDIZEM CD) 180 MG 24 hr capsule [id number] Yes Take 1 capsule (180 mg total) by mouth daily. [name], MD Taking Active   fluticasone [facility]) 50 MCG/ACT nasal spray [id number] Yes Place 2 sprays into both nostrils daily. [name], MD Taking Active   fluticasone [facility] HFA) 110 MCG/ACT inhaler [id number] Yes Inhale 2 puffs into the lungs in the morning and at bedtime. Rinse after use. [name], MD Taking Active Family Member  isosorbide mononitrate (IMDUR) 30 MG 24 hr tablet [id number] Yes Take 1 tablet (30 mg total) by mouth daily. [name], MD Taking Active    lansoprazole (PREVACID) 30 MG capsule [id number] Yes Take 1 capsule by mouth twice daily [name], MD Taking Active   metoprolol tartrate (LOPRESSOR) 25 MG tablet [id number] Yes Take 1 tablet (25 mg total) by mouth 2 (two) times daily. [name], MD Taking Expired 05/13/20 2359   sertraline (ZOLOFT) 100 MG tablet [id number] Yes Take one and one-half tablets by mouth once daily [name], MD Taking Active   vitamin B-12 (CYANOCOBALAMIN) 1000 MCG tablet [id number] Yes Take 1,000 mcg by mouth daily. [provider] Taking Active Family Member          Patient Active Problem List   Diagnosis Date Noted  . Vision changes 04/26/2020  . Foot callus 12/30/2019  . Hypoxia   . COVID-19 11/19/2019  . Urinary frequency 04/14/2019  . Gout 08/21/2017  . Vision loss 07/30/2017  . Headache 07/30/2017  . Syncope 12/08/2016  . Atrial fibrillation ([place]) 12/05/2016  . Advance care planning 09/09/2016  . Fatigue 01/20/2016  . Arthritis 04/13/2015  . Rhinitis 02/25/2015  . Osteoarthritis of both knees 11/13/2014  . Primary osteoarthritis of both knees 11/13/2014  . Cough 11/13/2013  . UTI (urinary tract infection) 11/21/2012  . Flank pain 11/21/2012  . GERD (gastroesophageal reflux disease)   . Back pain 05/04/2011  . URI (upper respiratory infection) 10/27/2010  . Hand pain 10/27/2010  . Osteoporosis 08/23/2010  . UNSPECIFIED VITAMIN D DEFICIENCY 09/23/2008  . DEPRESSION/ANXIETY 09/23/2008  . Obesity (BMI 30.0-34.9) 06/21/2006  . CORONARY ARTERY DISEASE, S/P PTCA 06/21/2006  . Extrinsic asthma 06/21/2006  . Essential hypertension 06/13/1999  . Hyperlipidemia 03/15/1999    Immunization History  Administered Date(s) Administered  . Fluad Quad(high Dose 65+) 03/18/2019, 12/26/2019  . Influenza Split 12/23/2011  . Influenza,inj,Quad PF,6+ Mos 01/30/2014, 04/09/2015, 04/08/2016, 12/05/2016  . Pneumococcal Conjugate-13 11/13/2014  . Pneumococcal Polysaccharide-23  10/05/2009  . Td 09/23/2008  . Unspecified SARS-COV-2 Vaccination 05/13/2019, 06/03/2019    Conditions to be addressed/monitored:  Hypertension, Hyperlipidemia, Atrial Fibrillation, Coronary Artery Disease, GERD, Asthma, Depression and Anxiety  Care Plan : [place]  Updates made by [name], [facility] since 06/08/2020 12:00 AM    Problem: [place] Resolved 05/28/2020  Priority: High  Note:  Current Barriers:  Marland Kitchen Medication Adherence/Multiple Pharmacies  Pharmacist Clinical Goal(s):  Marland Kitchen Patient will contact provider office for questions/concerns as evidenced notation of same in electronic health record through collaboration with PharmD and provider.   Interventions: . 1:1 collaboration with [name], MD regarding development and update of comprehensive plan of care as evidenced by provider attestation and co-signature . Inter-disciplinary care team collaboration (see longitudinal plan of care) . Comprehensive medication review performed; medication list updated in electronic medical record  Hypertension/CAD (BP goal <140/90) Query Controlled - elevated on last check, prior within goal  -Current treatment: . Metoprolol tartrate 25 mg - 1 tablet BID . Diltiazem 180 mg - 1 capsule daily . Isosorbide Mononitrate 30 mg - 1 tablet daily   . Clonidine (not taking) -Medications previously tried: none  -Current home readings: does not know how to work BP monitor, states she will try it   -Denies hypotensive/hypertensive symptoms -Educated on BP goals and benefits of medications for prevention of heart attack, stroke and kidney damage; -Counseled to monitor BP at home and call and let us know, document, and provide log at future appointments -Recommended to continue current medication; Patient will check home BP, pt to call with readings.   Hyperlipidemia: (LDL goal < 70) -Not ideally controlled - LDL 80  -Current treatment: . Atorvastatin 80 mg - 1  tablet daily  -Medications previously tried: none  -On max dose statin, some adherence concerns -Recommended to continue current medication; Encourage daily adherence.  Atrial Fibrillation (Goal: prevent stroke and major bleeding) -Controlled -Current treatment: . Rate control: Metoprolol tartrate 25 mg - 1 tablet BID  . Anticoagulation: Eliquis 5 mg - 1 tablet BID -Medications previously tried: none  -Home BP and HR readings: none. Not checking   -Dosing appropriate per age, weight, and Scr. -Counseled on increased risk of stroke due to Afib and benefits of anticoagulation for stroke prevention; importance of adherence to anticoagulant exactly as prescribed; avoidance of NSAIDs due to increased bleeding risk with anticoagulants; -Recommended to continue current medication  Depression/Anxiety (Goal: Control symptoms) -Controlled -Current treatment: . Sertraline 100 mg - 1 and 1/2 tablet daily  -Medications previously tried/failed: none -Patient reports doing well on current dose (150 mg daily)  -Educated on Benefits of medication for symptom control importance of avoiding missed doses  -Recommended to continue current medication  GERD (Goal: Improve symptoms) -Controlled -Current treatment  . Lansoprazole 30 mg - 1 capsule twice daily before meals   -Medications previously tried: none reported -Patient reports some breakthrough symptoms. Confirms she takes before meals. Recommended taking 30 to 60 minutes before eating twice daily.  -Recommended to continue current medication; Recommend proper admin time.   Patient Goals/Self-Care Activities . Patient will:  - take medications as prescribed check blood pressure at home, document, and provide at future appointments  Follow Up Plan: The care management team will reach out to the patient again over the next 30 days. CMA will call for BP log.     Medication Assistance: None required.  Patient affirms current coverage meets  needs.  Patient's preferred pharmacy is:  [facility] - [place], [place] - [address] [place] [address] Phone: [phone] Fax: [phone]  Uses pill box? Yes Pt endorses 90% compliance - May miss her evening medications 1-2 times per month Fills two weeks at a time, uses a pillbox with morning and evening dosing   Medication Review: -Patient able to state  names, dose, frequency, and time of day   Breakfast: Diltiazem Metoprolol Atorvastatin Eliquis  Lansoprazole Sertraline Isosorbide Mononitrate Vitamin C 500 mg - 1 tablet daily  Vitamin D3 1000 IU - 1 capsule daily  Vitamin B12 1000 mcg - 1 tablet daily   Evening Meal Lansoprazole  Eliquis Metoprolol  PRN: Xanax - takes PRN anxiety, usually once at bedtime   Asthma: Flonase Nasal - using every 4-6 hours (recommended taking only 2 sprays once daily) Albuterol - using about once daily  Flovent 110 mcg/act - 2 puffs twice daily (rinses afterwards)  Care Plan and Follow Up Patient Decision:  Patient agrees to Care Plan and Follow-up.  [name], PharmD Clinical Pharmacist [facility] at [facility] [phone]   Encounter details: CCM Time Spent      Value Time User   Time spent with patient (minutes)  120 06/09/2020  4:39 PM [name], [facility]   Time spent performing Chart review  30 06/09/2020  4:39 PM [name], [facility]   Total time (minutes)  150 06/09/2020  4:39 PM [name], RPH     Moderate to High Complex Decision Making      Value Time User   Moderate to High complex decision making  Yes 05/28/2020 12:17 PM [name], [facility]     CCM Services: This encounter meets complex CCM services and moderate to high decision making.  Prior to outreach and patient consent for Chronic Care Management, I referred this patient for services after reviewing the nominated patient list or from a personal encounter with the  patient.  I have personally reviewed this encounter including the documentation in this note and have collaborated with the care management provider regarding care management and care coordination activities to include development and update of the comprehensive care plan. I am certifying that I agree with the content of this note and encounter as supervising physician.  [name]

## 2020-06-08 NOTE — Patient Instructions (Signed)
Dear [name],  It was a pleasure meeting you during our initial appointment on May 28, 2020. Below is a summary of the goals we discussed and components of chronic care management. Please contact me anytime with questions or concerns.   Visit Information  Patient Care Plan: CCM Pharmacy Care Plan    Problem Identified: [name] Resolved 05/28/2020  Priority: High  Note:     Current Barriers:  Marland Kitchen Medication Adherence/Multiple Pharmacies  Pharmacist Clinical Goal(s):  Marland Kitchen Patient will contact provider office for questions/concerns as evidenced notation of same in electronic health record through collaboration with PharmD and provider.   Interventions: . 1:1 collaboration with [name], MD regarding development and update of comprehensive plan of care as evidenced by provider attestation and co-signature . Inter-disciplinary care team collaboration (see longitudinal plan of care) . Comprehensive medication review performed; medication list updated in electronic medical record  Hypertension/CAD (BP goal <140/90) Query Controlled - elevated on last check, prior within goal  -Current treatment: . Metoprolol tartrate 25 mg - 1 tablet BID . Diltiazem 180 mg - 1 capsule daily . Isosorbide Mononitrate 30 mg - 1 tablet daily   . Clonidine (not taking) -Medications previously tried: none  -Current home readings: does not know how to work BP monitor, states she will try it   -Denies hypotensive/hypertensive symptoms -Educated on BP goals and benefits of medications for prevention of heart attack, stroke and kidney damage; -Counseled to monitor BP at home and call and let us know, document, and provide log at future appointments -Recommended to continue current medication; Patient will check home BP, pt to call with readings.   Hyperlipidemia: (LDL goal < 70) -Not ideally controlled - LDL 80  -Current treatment: . Atorvastatin 80 mg - 1 tablet daily  -Medications  previously tried: none  -On max dose statin, some adherence concerns -Recommended to continue current medication; Encourage daily adherence.  Atrial Fibrillation (Goal: prevent stroke and major bleeding) -Controlled -Current treatment: . Rate control: Metoprolol tartrate 25 mg - 1 tablet BID  . Anticoagulation: Eliquis 5 mg - 1 tablet BID -Medications previously tried: none  -Home BP and HR readings: none. Not checking   -Dosing appropriate per age, weight, and Scr. -Counseled on increased risk of stroke due to Afib and benefits of anticoagulation for stroke prevention; importance of adherence to anticoagulant exactly as prescribed; avoidance of NSAIDs due to increased bleeding risk with anticoagulants; -Recommended to continue current medication  Depression/Anxiety (Goal: Control symptoms) -Controlled -Current treatment: . Sertraline 100 mg - 1 and 1/2 tablet daily  -Medications previously tried/failed: none -Patient reports doing well on current dose (150 mg daily)  -Educated on Benefits of medication for symptom control importance of avoiding missed doses  -Recommended to continue current medication  GERD (Goal: Improve symptoms) -Controlled -Current treatment  . Lansoprazole 30 mg - 1 capsule twice daily before meals   -Medications previously tried: none reported -Patient reports some breakthrough symptoms. Confirms she takes before meals. Recommended taking 30 to 60 minutes before eating twice daily.  -Recommended to continue current medication; Recommend proper admin time.   Patient Goals/Self-Care Activities . Patient will:  - take medications as prescribed check blood pressure at home, document, and provide at future appointments  Follow Up Plan: The care management team will reach out to the patient again over the next 30 days. CMA will call for BP log.      [name] was given information about Chronic Care Management  services today including:  1. CCM service  includes personalized support from designated clinical staff supervised by her physician, including individualized plan of care and coordination with other care providers 2. 24/7 contact phone numbers for assistance for urgent and routine care needs. 3. Standard insurance, coinsurance, copays and deductibles apply for chronic care management only during months in which we provide at least 20 minutes of these services. Most insurances cover these services at 100%, however patients may be responsible for any copay, coinsurance and/or deductible if applicable. This service may help you avoid the need for more expensive face-to-face services. 4. Only one practitioner may furnish and bill the service in a calendar month. 5. The patient may stop CCM services at any time (effective at the end of the month) by phone call to the office staff.  Patient agreed to services and verbal consent obtained.   The patient verbalized understanding of instructions, educational materials, and care plan provided today and declined offer to receive copy of patient instructions, educational materials, and care plan.  The pharmacy team will reach out to the patient again over the next 30 days.   [name], PharmD Clinical Pharmacist [facility] at [facility] [phone]

## 2020-06-15 ENCOUNTER — Telehealth: Payer: Self-pay

## 2020-06-15 MED ORDER — ATORVASTATIN CALCIUM 80 MG PO TABS: 80.0000 mg | ORAL_TABLET | Freq: Every day | ORAL | 1 refills | Status: DC

## 2020-06-15 NOTE — Telephone Encounter (Signed)
-----   Message from [place] sent at 06/15/2020 11:49 AM EDT ----- Regarding: Refill Contact: [phone] Please send refill of Atorvastatin to [facility] if appropriate.  Thank you, [name]

## 2020-06-15 NOTE — Telephone Encounter (Signed)
[facility]

## 2020-06-22 ENCOUNTER — Other Ambulatory Visit: Payer: Self-pay | Admitting: Family Medicine

## 2020-06-25 DIAGNOSIS — H353211 Exudative age-related macular degeneration, right eye, with active choroidal neovascularization: Secondary | ICD-10-CM | POA: Diagnosis not present

## 2020-06-25 DIAGNOSIS — R6889 Other general symptoms and signs: Secondary | ICD-10-CM | POA: Diagnosis not present

## 2020-06-25 DIAGNOSIS — H353122 Nonexudative age-related macular degeneration, left eye, intermediate dry stage: Secondary | ICD-10-CM | POA: Diagnosis not present

## 2020-06-25 DIAGNOSIS — H43813 Vitreous degeneration, bilateral: Secondary | ICD-10-CM | POA: Diagnosis not present

## 2020-07-04 ENCOUNTER — Other Ambulatory Visit: Payer: Self-pay | Admitting: Family Medicine

## 2020-07-15 ENCOUNTER — Other Ambulatory Visit: Payer: Self-pay | Admitting: Family Medicine

## 2020-07-29 ENCOUNTER — Telehealth: Payer: Self-pay

## 2020-07-29 ENCOUNTER — Other Ambulatory Visit: Payer: Self-pay

## 2020-07-29 ENCOUNTER — Observation Stay
Admission: EM | Admit: 2020-07-29 | Discharge: 2020-07-31 | Disposition: A | Discharge status: Home / Self Care | Payer: Medicare Other | Attending: Internal Medicine | Admitting: Internal Medicine

## 2020-07-29 ENCOUNTER — Emergency Department: Payer: Medicare Other

## 2020-07-29 DIAGNOSIS — I4891 Unspecified atrial fibrillation: Secondary | ICD-10-CM | POA: Diagnosis present

## 2020-07-29 DIAGNOSIS — J45909 Unspecified asthma, uncomplicated: Secondary | ICD-10-CM | POA: Diagnosis present

## 2020-07-29 DIAGNOSIS — F341 Dysthymic disorder: Secondary | ICD-10-CM | POA: Diagnosis not present

## 2020-07-29 DIAGNOSIS — I259 Chronic ischemic heart disease, unspecified: Secondary | ICD-10-CM | POA: Diagnosis present

## 2020-07-29 DIAGNOSIS — K8080 Other cholelithiasis without obstruction: Secondary | ICD-10-CM | POA: Diagnosis not present

## 2020-07-29 DIAGNOSIS — Z96653 Presence of artificial knee joint, bilateral: Secondary | ICD-10-CM | POA: Insufficient documentation

## 2020-07-29 DIAGNOSIS — K5790 Diverticulosis of intestine, part unspecified, without perforation or abscess without bleeding: Secondary | ICD-10-CM

## 2020-07-29 DIAGNOSIS — Z8616 Personal history of COVID-19: Secondary | ICD-10-CM | POA: Insufficient documentation

## 2020-07-29 DIAGNOSIS — K8020 Calculus of gallbladder without cholecystitis without obstruction: Secondary | ICD-10-CM | POA: Diagnosis not present

## 2020-07-29 DIAGNOSIS — E785 Hyperlipidemia, unspecified: Secondary | ICD-10-CM | POA: Diagnosis present

## 2020-07-29 DIAGNOSIS — Z20822 Contact with and (suspected) exposure to covid-19: Secondary | ICD-10-CM | POA: Insufficient documentation

## 2020-07-29 DIAGNOSIS — I10 Essential (primary) hypertension: Secondary | ICD-10-CM | POA: Diagnosis present

## 2020-07-29 DIAGNOSIS — Z79899 Other long term (current) drug therapy: Secondary | ICD-10-CM | POA: Diagnosis not present

## 2020-07-29 DIAGNOSIS — R42 Dizziness and giddiness: Secondary | ICD-10-CM | POA: Diagnosis not present

## 2020-07-29 DIAGNOSIS — Z7901 Long term (current) use of anticoagulants: Secondary | ICD-10-CM | POA: Diagnosis not present

## 2020-07-29 DIAGNOSIS — M545 Low back pain, unspecified: Secondary | ICD-10-CM | POA: Diagnosis not present

## 2020-07-29 DIAGNOSIS — J4520 Mild intermittent asthma, uncomplicated: Secondary | ICD-10-CM

## 2020-07-29 DIAGNOSIS — I2510 Atherosclerotic heart disease of native coronary artery without angina pectoris: Secondary | ICD-10-CM | POA: Diagnosis not present

## 2020-07-29 DIAGNOSIS — K219 Gastro-esophageal reflux disease without esophagitis: Secondary | ICD-10-CM | POA: Diagnosis present

## 2020-07-29 DIAGNOSIS — I6782 Cerebral ischemia: Secondary | ICD-10-CM | POA: Diagnosis not present

## 2020-07-29 DIAGNOSIS — K922 Gastrointestinal hemorrhage, unspecified: Principal | ICD-10-CM | POA: Diagnosis present

## 2020-07-29 LAB — BASIC METABOLIC PANEL
Anion gap: 10 (ref 5–15)
BUN: 13 mg/dL (ref 8–23)
CO2: 22 mmol/L (ref 22–32)
Calcium: 8.9 mg/dL (ref 8.9–10.3)
Chloride: 107 mmol/L (ref 98–111)
Creatinine, Ser: 0.85 mg/dL (ref 0.44–1.00)
GFR, Estimated: >60 mL/min (ref >60)
Glucose, Bld: 106 mg/dL — ABNORMAL HIGH (ref 70–99)
Potassium: 3.5 mmol/L (ref 3.5–5.1)
Sodium: 139 mmol/L (ref 135–145)

## 2020-07-29 LAB — URINALYSIS, COMPLETE (UACMP) WITH MICROSCOPIC
Bacteria, UA: NONE SEEN
Bilirubin Urine: NEGATIVE
Glucose, UA: NEGATIVE mg/dL
Hgb urine dipstick: NEGATIVE
Ketones, ur: 5 mg/dL — AB
Leukocytes,Ua: NEGATIVE
Nitrite: NEGATIVE
Protein, ur: 100 mg/dL — AB
Specific Gravity, Urine: 1.018 (ref 1.005–1.030)
pH: 5 (ref 5.0–8.0)

## 2020-07-29 LAB — HEMOGLOBIN AND HEMATOCRIT, BLOOD
HCT: 36.1 % (ref 36.0–46.0)
Hemoglobin: 11.7 g/dL — ABNORMAL LOW (ref 12.0–15.0)

## 2020-07-29 LAB — SAMPLE TO BLOOD BANK

## 2020-07-29 LAB — CBC
HCT: 38.1 % (ref 36.0–46.0)
Hemoglobin: 12.5 g/dL (ref 12.0–15.0)
MCH: 28.4 pg (ref 26.0–34.0)
MCHC: 32.8 g/dL (ref 30.0–36.0)
MCV: 86.6 fL (ref 80.0–100.0)
Platelets: 194 10*3/uL (ref 150–400)
RBC: 4.4 MIL/uL (ref 3.87–5.11)
RDW: 15 % (ref 11.5–15.5)
WBC: 8.5 10*3/uL (ref 4.0–10.5)
nRBC: 0 % (ref 0.0–0.2)

## 2020-07-29 LAB — RESP PANEL BY RT-PCR (FLU A&B, COVID) ARPGX2
Influenza A by PCR: NEGATIVE
Influenza B by PCR: NEGATIVE
SARS Coronavirus 2 by RT PCR: NEGATIVE

## 2020-07-29 MED ORDER — ACETAMINOPHEN 650 MG RE SUPP: 650.0000 mg | Freq: Four times a day (QID) | RECTAL | Status: DC | PRN

## 2020-07-29 MED ORDER — PANTOPRAZOLE SODIUM 40 MG PO TBEC
40.0000 mg | DELAYED_RELEASE_TABLET | Freq: Every day | ORAL | Status: DC
  Administered 2020-07-30 – 2020-07-31 (×2): 40 mg via ORAL
  Filled 2020-07-29 (×2): qty 1

## 2020-07-29 MED ORDER — ATORVASTATIN CALCIUM 20 MG PO TABS
80.0000 mg | ORAL_TABLET | Freq: Every day | ORAL | Status: DC
  Administered 2020-07-30 – 2020-07-31 (×2): 80 mg via ORAL
  Filled 2020-07-29 (×2): qty 4

## 2020-07-29 MED ORDER — ALPRAZOLAM 0.5 MG PO TABS: 0.2500 mg | ORAL_TABLET | Freq: Every day | ORAL | Status: DC | PRN

## 2020-07-29 MED ORDER — ALBUTEROL SULFATE HFA 108 (90 BASE) MCG/ACT IN AERS
1.0000 | INHALATION_SPRAY | RESPIRATORY_TRACT | Status: DC | PRN
  Filled 2020-07-29: qty 6.7

## 2020-07-29 MED ORDER — ISOSORBIDE MONONITRATE ER 30 MG PO TB24
30.0000 mg | ORAL_TABLET | Freq: Every day | ORAL | Status: DC
  Administered 2020-07-30 – 2020-07-31 (×2): 30 mg via ORAL
  Filled 2020-07-29 (×2): qty 1

## 2020-07-29 MED ORDER — ACETAMINOPHEN 325 MG PO TABS: 650.0000 mg | ORAL_TABLET | Freq: Four times a day (QID) | ORAL | Status: DC | PRN

## 2020-07-29 MED ORDER — SODIUM CHLORIDE 0.9% FLUSH
3.0000 mL | Freq: Two times a day (BID) | INTRAVENOUS | Status: DC
  Administered 2020-07-29 – 2020-07-31 (×4): 3 mL via INTRAVENOUS

## 2020-07-29 MED ORDER — METOPROLOL TARTRATE 25 MG PO TABS
25.0000 mg | ORAL_TABLET | Freq: Two times a day (BID) | ORAL | Status: DC
  Administered 2020-07-29 – 2020-07-31 (×4): 25 mg via ORAL
  Filled 2020-07-29 (×4): qty 1

## 2020-07-29 MED ORDER — DILTIAZEM HCL ER COATED BEADS 180 MG PO CP24
180.0000 mg | ORAL_CAPSULE | Freq: Every day | ORAL | Status: DC
  Administered 2020-07-30 – 2020-07-31 (×2): 180 mg via ORAL
  Filled 2020-07-29 (×2): qty 1

## 2020-07-29 MED ORDER — SODIUM CHLORIDE 0.9 % IV BOLUS
500.0000 mL | Freq: Once | INTRAVENOUS | Status: AC
  Administered 2020-07-29: 500 mL via INTRAVENOUS

## 2020-07-29 MED ORDER — SERTRALINE HCL 50 MG PO TABS
150.0000 mg | ORAL_TABLET | Freq: Every day | ORAL | Status: DC
  Administered 2020-07-30 – 2020-07-31 (×2): 150 mg via ORAL
  Filled 2020-07-29 (×2): qty 3

## 2020-07-29 NOTE — Telephone Encounter (Signed)
I am out of clinic today.  I agree with the eval today.  Thanks.

## 2020-07-29 NOTE — ED Notes (Signed)
Patient taken to CT.
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
## 2020-07-29 NOTE — ED Triage Notes (Signed)
Pt comes with c/o fall after getting dizziness. Pt states she fell and hurt her lower back and tailbone. Pt states she went to bathroom and wiped then noticed bleeding.   Pt states heavy bleeding today.

## 2020-07-29 NOTE — Telephone Encounter (Signed)
[name] - Client TELEPHONE ADVICE RECORD [facility] Patient Name: [name] Gender: Female DOB: 09-22-1935 Age: 85 Y 90 M 22 D Return Phone Number: [phone] (Primary), [phone] (Secondary) Address: [place] [address] Client [name] - Client Client Site [place] Physician Renford Dills - MD Contact Type Call Who Is Calling Patient / Member / Family / Caregiver Call Type Triage / Clinical Relationship To Patient Self Return Phone Number [phone] (Primary) Chief Complaint Dizziness Reason for Call Symptomatic / Request for [place] states she had a headache, currently feels dizzy and had fallen. Caller states she did not hit her head but her tailboarn is real sore. Caller states she feels like she has fluid flowing out of her vagina and noticed a little bit of blood coming out when she wiped. The office told the caller that her Dr. is not in on Wednesday's and he has no openings tomorrow but that she does have an appointment on Friday with her doctor. Translation No Nurse Assessment Nurse: Evern Bio, RN, Coralyn Mark Date/Time (Eastern Time): 07/29/2020 3:10:49 PM Confirm and document reason for call. If symptomatic, describe symptoms. ---Caller states she had a headache, currently feels dizzy and had fallen. Caller states she feels like she has fluid flowing out of her bottom/anus and noticed a little bit of blood coming out when she wiped. Occurred on Monday. Fell on bottom. Does the patient have any new or worsening symptoms? ---Yes Will a triage be completed? ---Yes Related visit to physician within the last 2 weeks? ---No Does the PT have any chronic conditions? (i.e. diabetes, asthma, this includes High risk factors for pregnancy, etc.) ---Yes List chronic conditions. ---HTN; High chol; eye injections Is this a  behavioral health or substance abuse call? ---No PLEASE NOTE: All timestamps contained within this report are represented as [place] Standard Time. CONFIDENTIALTY NOTICE: This fax transmission is intended only for the addressee. It contains information that is legally privileged, confidential or otherwise protected from use or disclosure. If you are not the intended recipient, you are strictly prohibited from reviewing, disclosing, copying using or disseminating any of this information or taking any action in reliance on or regarding this information. If you have received this fax in error, please notify us immediately by telephone so that we can arrange for its return to [place]. Phone: [phone], Toll-Free: [phone], Fax: [phone] Page: 2 of 3 Call Id: [id number] Guidelines Guideline Title Affirmed Question Affirmed Notes Nurse Date/Time Eilene Ghazi Time) Dizziness - Lightheadedness Dizziness caused by not drinking enough fluids Bog[place], RN, Coralyn Mark 07/29/2020 3:17:34 PM Tailbone Injury [1] High-risk adult (e.g., age > 5 years, osteoporosis, chronic steroid use) AND [2] still hurts Bogard, RN, Coralyn Mark 07/29/2020 3:27:07 PM Disp. Time Eilene Ghazi Time) Disposition Final User 07/29/2020 [place], RN, Coralyn Mark 07/29/2020 3:48:57 PM See PCP within 24 Hours Yes Bogard, RN, Loreen Freud Disagree/Comply Comply Caller Understands Yes PreDisposition Call Doctor Care Advice Given Per Guideline HOME CARE: * You should be able to treat this at home. REASSURANCE AND EDUCATION - DIZZINESS FROM NOT DRINKING ENOUGH LIQUIDS: * This is worse during hot weather or in hot dry climates. * Not drinking enough fluids and being a little dehydrated is a common cause of temporary dizziness. DRINK FLUIDS: * Drink several glasses of fruit juice, other clear fluids or water. PREVENTION: * Drink extra water and eat some salty foods during exercise or  hot weather. CALL BACK IF: * After 2 hours of  rest and fluids AND still feeling dizzy. * Passes out (faints). * You become worse CARE ADVICE given per Dizziness (Adult) guideline. SEE PCP WITHIN 24 HOURS: * IF OFFICE WILL BE OPEN: You need to be examined within the next 24 hours. Call your doctor (or NP/PA) when the office opens and make an appointment. CARE ADVICE given per Tailbone Injury (Adult) guideline. Comments User: Boneta Lucks, RN Date/Time Eilene Ghazi Time): 07/29/2020 3:52:29 PM Pt upgraded to [place] TO ED NOW d/t sudden increase in rectal bleeding. Pt instructed to go to ED. Warm transfer to office done. OV also scheduled w/ PCP d/t Dispo Outcome and possible need for ED f/u. S/w office and appt sched'd. Both pt and office verbalized understanding and agreed. Referrals REFERRED TO PCP OFFICE Warm transfer to backline PLEASE NOTE: All timestamps contained within this report are represented as [place] Standard Time. CONFIDENTIALTY NOTICE: This fax transmission is intended only for the addressee. It contains information that is legally privileged, confidential or otherwise protected from use or disclosure. If you are not the intended recipient, you are strictly prohibited from reviewing, disclosing, copying using or disseminating any of this information or taking any action in reliance on or regarding this information. If you have received this fax in error, please notify us immediately by telephone so that we can arrange for its return to [place]. Phone: [phone], Toll-Free: [phone], Fax: [phone] Page: 3 of 3 Call Id:

## 2020-07-29 NOTE — ED Notes (Signed)
Pt reports dizzy spell 2 days and almost passed out in the yard.  Pt states today noticed blood from rectum today.  Pt has some weakness.   No diarrhea.  No vomiting.

## 2020-07-29 NOTE — Telephone Encounter (Signed)
Unable to reach pt on phone but Threasa Beards CMA said when she talked with access nurse and pt; pt said that she would find someone to take her to ED;pt declined 911 but pt did confirm she would go to ED. Sending note to Dr Damita Dunnings and Janett Billow CMA.

## 2020-07-30 ENCOUNTER — Encounter: Payer: Self-pay | Admitting: Internal Medicine

## 2020-07-30 ENCOUNTER — Ambulatory Visit: Payer: Medicare Other | Admitting: Family Medicine

## 2020-07-30 DIAGNOSIS — I4891 Unspecified atrial fibrillation: Secondary | ICD-10-CM

## 2020-07-30 DIAGNOSIS — K922 Gastrointestinal hemorrhage, unspecified: Secondary | ICD-10-CM

## 2020-07-30 DIAGNOSIS — I10 Essential (primary) hypertension: Secondary | ICD-10-CM

## 2020-07-30 DIAGNOSIS — K625 Hemorrhage of anus and rectum: Secondary | ICD-10-CM

## 2020-07-30 DIAGNOSIS — K5790 Diverticulosis of intestine, part unspecified, without perforation or abscess without bleeding: Secondary | ICD-10-CM | POA: Diagnosis not present

## 2020-07-30 LAB — BASIC METABOLIC PANEL
Anion gap: 8 (ref 5–15)
BUN: 10 mg/dL (ref 8–23)
CO2: 23 mmol/L (ref 22–32)
Calcium: 8.2 mg/dL — ABNORMAL LOW (ref 8.9–10.3)
Chloride: 109 mmol/L (ref 98–111)
Creatinine, Ser: 0.74 mg/dL (ref 0.44–1.00)
GFR, Estimated: >60 mL/min (ref >60)
Glucose, Bld: 96 mg/dL (ref 70–99)
Potassium: 3.5 mmol/L (ref 3.5–5.1)
Sodium: 140 mmol/L (ref 135–145)

## 2020-07-30 LAB — CBC
HCT: 34 % — ABNORMAL LOW (ref 36.0–46.0)
HCT: 36 % (ref 36.0–46.0)
Hemoglobin: 11.1 g/dL — ABNORMAL LOW (ref 12.0–15.0)
Hemoglobin: 11.7 g/dL — ABNORMAL LOW (ref 12.0–15.0)
MCH: 28.5 pg (ref 26.0–34.0)
MCH: 28.5 pg (ref 26.0–34.0)
MCHC: 32.6 g/dL (ref 30.0–36.0)
MCHC: 32.5 g/dL (ref 30.0–36.0)
MCV: 87.4 fL (ref 80.0–100.0)
MCV: 87.6 fL (ref 80.0–100.0)
Platelets: 176 10*3/uL (ref 150–400)
Platelets: 178 10*3/uL (ref 150–400)
RBC: 3.89 MIL/uL (ref 3.87–5.11)
RBC: 4.11 MIL/uL (ref 3.87–5.11)
RDW: 14.9 % (ref 11.5–15.5)
RDW: 14.9 % (ref 11.5–15.5)
WBC: 8 10*3/uL (ref 4.0–10.5)
WBC: 7 10*3/uL (ref 4.0–10.5)
nRBC: 0 % (ref 0.0–0.2)
nRBC: 0 % (ref 0.0–0.2)

## 2020-07-30 LAB — TYPE AND SCREEN
ABO/RH(D): A POS
Antibody Screen: NEGATIVE

## 2020-07-30 LAB — PROTIME-INR
INR: 1.2 (ref 0.8–1.2)
Prothrombin Time: 15.1 seconds (ref 11.4–15.2)

## 2020-07-30 NOTE — Progress Notes (Addendum)
[place]: [name]    MR#:  [id number]  DATE OF BIRTH:  [birth year]  SUBJECTIVE:  patient just got  to the room from the ER. Denies any abdominal pain or rectal bleed today  REVIEW OF SYSTEMS:   Review of Systems  Constitutional: Negative for chills, fever and weight loss.  HENT: Negative for ear discharge, ear pain and nosebleeds.   Eyes: Negative for blurred vision, pain and discharge.  Respiratory: Negative for sputum production, shortness of breath, wheezing and stridor.   Cardiovascular: Negative for chest pain, palpitations, orthopnea and PND.  Gastrointestinal: Negative for abdominal pain, diarrhea, nausea and vomiting.  Genitourinary: Negative for frequency and urgency.  Musculoskeletal: Negative for back pain and joint pain.  Neurological: Positive for weakness. Negative for sensory change, speech change and focal weakness.  Psychiatric/Behavioral: Negative for depression and hallucinations. The patient is not nervous/anxious.    Tolerating Diet: Tolerating PT:   DRUG ALLERGIES:   Allergies  Allergen Reactions  . Amoxicillin Swelling    Face hands  . Penicillins Swelling and Other (See Comments)    Has patient had a PCN reaction causing immediate rash, facial/tongue/throat swelling, SOB or lightheadedness with hypotension: Yes Has patient had a PCN reaction causing severe rash involving mucus membranes or skin necrosis: No Has patient had a PCN reaction that required hospitalization No phalosporin use.  . Valium [Diazepam] Other (See Comments)    Pt had shakes  . Imdur [Isosorbide Nitrate]     Headache at 60mg  dose    VITALS:  Blood pressure (!) 142/72, pulse (!) 48, temperature 98.3 F (36.8 C), resp. rate 18, SpO2 96 %.  PHYSICAL EXAMINATION:   Physical Exam  GENERAL:  85 y.o.-year-old patient lying in the bed with no acute distress.  LUNGS: Normal breath sounds bilaterally, no wheezing,  rales, rhonchi. No use of accessory muscles of respiration.  CARDIOVASCULAR: S1, S2 normal. No murmurs, rubs, or gallops.  ABDOMEN: Soft, nontender, nondistended. Bowel sounds present. No organomegaly or mass. Abdominal obesity EXTREMITIES: No cyanosis, clubbing or edema b/l.    NEUROLOGIC: Cranial nerves II through XII are intact. No focal Motor or sensory deficits b/l.   PSYCHIATRIC:  patient is alert and oriented x 3.  SKIN: No obvious rash, lesion, or ulcer.   LABORATORY PANEL:  CBC Recent Labs  Lab 07/30/20 0442  WBC 7.0  HGB 11.7*  HCT 36.0  PLT 178    Chemistries  Recent Labs  Lab 07/30/20 0442  NA 140  K 3.5  CL 109  CO2 23  GLUCOSE 96  BUN 10  CREATININE 0.74  CALCIUM 8.2*   Cardiac Enzymes No results for input(s): TROPONINI in the last 168 hours. RADIOLOGY:  CT ABDOMEN PELVIS WO CONTRAST  Result Date: 07/29/2020 CLINICAL DATA:  Low back pain fall EXAM: CT ABDOMEN AND PELVIS WITHOUT CONTRAST TECHNIQUE: Multidetector CT imaging of the abdomen and pelvis was performed following the standard protocol without IV contrast. COMPARISON:  CT 05/17/2016 FINDINGS: Lower chest: Lung bases demonstrate no acute consolidation or effusion. Mild fibrosis within the medial right base potentially osteophyte mediated. Atelectasis or scarring at the right middle lobe. Dense mitral calcification. Borderline cardiac size. Hepatobiliary: Multiple gallstones. No focal hepatic abnormality or biliary dilatation Pancreas: Unremarkable. No pancreatic ductal dilatation or surrounding inflammatory changes. Spleen: Splenic granuloma Adrenals/Urinary Tract: Adrenal glands are normal. Kidneys show no hydronephrosis. The bladder is normal Stomach/Bowel: The stomach is nonenlarged. No dilated small bowel.  Left colon diverticular disease without acute wall thickening. Vascular/Lymphatic: Advanced aortic atherosclerosis. No aneurysm. No suspicious nodes Reproductive: Calcified uterine fibroids. Calcified  uterine vessels. No adnexal mass Other: Negative for free air or free fluid Musculoskeletal: No acute or suspicious osseous abnormality IMPRESSION: 1. No CT evidence for acute intra-abdominal or pelvic abnormality. 2. Gallstones 3. Left colon diverticular disease without acute inflammatory change Electronically Signed   By: Donavan Foil M.D.   On: 07/29/2020 20:50   CT Head Wo Contrast  Result Date: 07/29/2020 CLINICAL DATA:  Fall.  Dizziness. EXAM: CT HEAD WITHOUT CONTRAST TECHNIQUE: Contiguous axial images were obtained from the base of the skull through the vertex without intravenous contrast. COMPARISON:  04/23/2020 FINDINGS: Brain: There is no evidence of an acute infarct, intracranial hemorrhage, mass, midline shift, or extra-axial fluid collection. Hypodensities in the cerebral white matter bilaterally are unchanged and nonspecific but compatible with mild chronic small vessel ischemic disease. Mild cerebral atrophy is within normal limits for age. Vascular: Calcified atherosclerosis at the skull base. No hyperdense vessel. Skull: No acute fracture or suspicious osseous lesion. Normal variant biparietal foramina. Partially visualized old left-sided facial fractures. Sinuses/Orbits: Bilateral cataract extraction.  No acute findings. Other: None. IMPRESSION: 1. No evidence of acute intracranial abnormality. 2. Mild chronic small vessel ischemic disease. Electronically Signed   By: Logan Bores M.D.   On: 07/29/2020 20:46   ASSESSMENT AND PLAN:  [name] is a 85 y.o. female with past medical history DJD, a fib on anticoagulation, CAD, depression, anxiety, hypertension, asthma, Gerd, gout, hyperlipidemia, history of syncope, history of DVT comes to the emergency room  with bright red blood per rectum.  The patient states that throughout the day today, she has noticed intermittent episodes in which she has bright red blood in her stool.  Rectal bleed/hematochezia chronic anticoagulation/a fib --  differential includes diverticular bleed, hemorrhoids versus mass -- hold eliquis -- hemoglobin stable at 11.7 -- no further bleed all day today -- patient seen by G.I. recommends colonoscopy. Patient is not keen on getting colonoscopy since she has to stay couple days since she is on eliquis. Patient says she wants to talk to her primary care and wants to get it done as outpatient as long as she does not have further bleeding. -- Clear liquid diet -- CT abdomen pelvis-- left diverticular disease without diverticulitis. Incidental gallstones  Lightheadedness/dizziness -- remains in sinus rhythm -- occasional heart rate drops in the 40s  -- will consider decreased her dose of diltiazem.  Asthma continue her albuterol  acid reflux/GERD -- continue PPI  Depression/anxiety -- continue Zoloft and PRN Xanax   Procedures: Family communication :none today Consults :GI CODE STATUS: FULL DVT Prophylaxis :SCD Level of care: Med-Surg Status is: Observation  The patient remains OBS appropriate and will d/c before 2 midnights.  Dispo: The patient is from: Home              Anticipated d/c is to: Home              Patient currently is not medically stable to d/c.   Difficult to place patient No        TOTAL TIME TAKING CARE OF THIS PATIENT: 25 minutes.  >50% time spent on counselling and coordination of care  Note: This dictation was prepared with Dragon dictation along with smaller phrase technology. Any transcriptional errors that result from this process are unintentional.  Fritzi Mandes M.D    [facility]   CC: Primary  care physician; [name], MDPatient ID: [name], female   DOB: Dec 10, 1935, 85 y.o.   MRN: [id number]
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
## 2020-07-30 NOTE — ED Notes (Signed)
Informed RN bed assigned 

## 2020-07-31 LAB — HEMOGLOBIN: Hemoglobin: 11.3 g/dL — ABNORMAL LOW (ref 12.0–15.0)

## 2020-07-31 MED ORDER — HYDROCORTISONE (PERIANAL) 2.5 % EX CREA: TOPICAL_CREAM | Freq: Two times a day (BID) | CUTANEOUS | 0 refills | Status: DC

## 2020-07-31 MED ORDER — HYDROCORTISONE (PERIANAL) 2.5 % EX CREA
TOPICAL_CREAM | Freq: Two times a day (BID) | CUTANEOUS | Status: DC
  Filled 2020-07-31: qty 28.35

## 2020-07-31 NOTE — Plan of Care (Signed)
Continuing with plan of care. 

## 2020-07-31 NOTE — Progress Notes (Addendum)
TOC consult to help patient with having family members move out of her home. Patient alert and oriented x 4. Patient states these family members have already moved out. She is worried about their children, no specific concerns noted. She has DSS number to contact if she has any concerns about her family members.   Patient stated her grandson will pick her up when discharged.  Denied additional TOC needs at this time.  Oleh Genin, [place]

## 2020-07-31 NOTE — Progress Notes (Signed)
Mobility Specialist - Progress Note   07/31/20 1400  Mobility  Activity Ambulated in hall  Level of Assistance Independent  Assistive Device Front wheel walker  Distance Ambulated (ft) 500 ft  Mobility Ambulated independently in hallway  Mobility Response Tolerated well  Mobility performed by Mobility specialist  $Mobility charge 1 Mobility    Pre-mobility: 66 HR, 93% SpO2 During mobility: 88 HR, 95% SpO2 Post-mobility: 68 HR, 96% SPO2   Pt ambulated in hallway with RW. No LOB. Independent. Pt tolerated well, denied SOB and fatigue until last 20' of ambulation. Pt returned to bed, feeling SOB requesting albuterol. RN notified. PLB engaged, O2 maintained high 90s throughout sesssion.    Kathee Delton Mobility Specialist 07/31/20, 2:06 PM

## 2020-07-31 NOTE — Care Management Obs Status (Signed)
[place] NOTIFICATION   Patient Details  Name: [name] MRN: [id number] Date of Birth: September 09, 1935   Medicare Observation Status Notification Given:  Yes  Reviewed with patient. Provided copy at bedside.  [place], LCSW 07/31/2020, 9:25 AM

## 2020-07-31 NOTE — Discharge Summary (Signed)
[place] [facility] [place] NAME: [name]    MR#:  [id number]  DATE OF BIRTH:  Mar 04, 1936  DATE OF ADMISSION:  07/29/2020 ADMITTING PHYSICIAN: Jacqueline Bruins, MD  DATE OF DISCHARGE: 07/31/2020  PRIMARY CARE PHYSICIAN: Jacqueline Ghent, MD    ADMISSION DIAGNOSIS:  Diverticulosis [K57.90] GI bleed [K92.2] Lower GI bleed [K92.2]  DISCHARGE DIAGNOSIS:  Rectal bleed suspect hemorrhoidal but needs Colonoscopy  SECONDARY DIAGNOSIS:   Past Medical History:  Diagnosis Date  . Allergy 08/26/2002   [facility] hemoptysis actually allergic rhinitis  . Back pain    pt states from knee pain  . CAD (coronary artery disease)    1 stent  . Cholelithiasis 07/1996  . Depression    takes Zoloft daily  . Exertional dyspnea 11/03/2011  . GERD (gastroesophageal reflux disease)    takes Prevacid daily  . Group B streptococcal infection 12/25/2011  . History of gout   . Hyperlipidemia 03/1999   takes Lipitor nightly  . Hypertension 06/1999  . Infection of total right knee replacement ([place]) 12/23/2011  . Joint pain   . Joint swelling   . Kidney stones   . Left leg DVT ([place]) 07/2010  . NSVD (normal spontaneous vaginal delivery)    x 5  . Obesity (BMI 30.0-34.9) 06/21/2006   Qualifier: Diagnosis of  By: Council Mechanic MD, [name]   . Osteoarthritis 07/1996  . Osteoarthritis of left knee 08/02/2011  . Osteoarthritis of right knee 11/01/2011  . Peripheral edema    takes Furosemide daily  . Pneumonia    hx of--as a child  . Primary osteoarthritis of left knee 09/23/2010   Per Dr. Mardelle Matte with [facility]   . Status post right total knee replacement 12/25/2011  . Urinary frequency     HOSPITAL COURSE:  [name] a 85 y.o.femalewith past medical history DJD, a fib on anticoagulation, CAD, depression, anxiety, hypertension, asthma, Gerd, gout, hyperlipidemia, history of syncope, history of DVT comes to the emergency room  with bright  red blood per rectum. The patient states that throughout the day today, she has noticed intermittent episodes in which she has bright red blood in her stool.  Rectal bleed/hematochezia chronic anticoagulation/a fib -- differential includes diverticular bleed, hemorrhoids versus mass -- hold eliquis -- hemoglobin stable at 11.7 -- no further bleed all day today as well -- patient seen by G.I. recommends colonoscopy. Patient is not keen on getting colonoscopy since she has to stay couple days since she is on eliquis. Patient says she wants to talk to her primary care and wants to get it done as outpatient since she does not have further bleeding. She knows to HOLD eliquis for now -- Clear liquid diet--soft diet -- CT abdomen pelvis-- left diverticular disease without diverticulitis. Incidental gallstones --ANusol HC cream for external hemorrhoids  Lightheadedness/dizziness -- remains in sinus rhythm -- occasional heart rate drops in the 40s  -- will give holding parameter to CCB  nd BB  Asthma continue her albuterol  acid reflux/GERD -- continue PPI  Depression/anxiety -- continue Zoloft and PRN Xanax  D/w GI for d/c plans and out pt f/u. Emailed PCP Dr Damita Dunnings of above.  Procedures: Family communication :none today Consults :GI CODE STATUS: FULL DVT Prophylaxis :SCD Level of care: Med-Surg Status is: Observation  The patient remains OBS appropriate and will d/c before 2 midnights.  Dispo: The patient is from: Home  Anticipated d/c is to: Home  Patient  currently is medically stable to d/c.              Difficult to place patient No    CONSULTS OBTAINED:    DRUG ALLERGIES:   Allergies  Allergen Reactions  . Amoxicillin Swelling    Face hands  . Penicillins Swelling and Other (See Comments)    Has patient had a PCN reaction causing immediate rash, facial/tongue/throat swelling, SOB or lightheadedness with hypotension: Yes Has  patient had a PCN reaction causing severe rash involving mucus membranes or skin necrosis: No Has patient had a PCN reaction that required hospitalization No phalosporin use.  . Valium [Diazepam] Other (See Comments)    Pt had shakes  . Imdur [Isosorbide Nitrate]     Headache at 60mg  dose    DISCHARGE MEDICATIONS:   Allergies as of 07/31/2020      Reactions   Amoxicillin Swelling   Face hands   Penicillins Swelling, Other (See Comments)   Has patient had a PCN reaction causing immediate rash, facial/tongue/throat swelling, SOB or lightheadedness with hypotension: Yes Has patient had a PCN reaction causing severe rash involving mucus membranes or skin necrosis: No Has patient had a PCN reaction that required hospitalization No phalosporin use.   Valium [diazepam] Other (See Comments)   Pt had shakes   Imdur [isosorbide Nitrate]    Headache at 60mg  dose      Medication List    STOP taking these medications   apixaban 5 MG Tabs tablet Commonly known as: ELIQUIS   ascorbic acid 500 MG tablet Commonly known as: VITAMIN C     TAKE these medications   albuterol 108 (90 Base) MCG/ACT inhaler Commonly known as: VENTOLIN HFA Inhale 2 puffs by mouth every 6 hours as needed for wheezing/shortness of breath What changed: See the new instructions.   ALPRAZolam 0.5 MG tablet Commonly known as: XANAX TAKE 1 TABLET BY MOUTH ONCE DAILY AS NEEDED FOR ANXIETY What changed:  how much to take how to take this when to take this reasons to take this   atorvastatin 80 MG tablet Commonly known as: LIPITOR TAKE 1 TABLET BY MOUTH  DAILY   cyclobenzaprine 5 MG tablet Commonly known as: FLEXERIL TAKE 1 TABLET BY MOUTH THREE TIMES DAILY AS NEEDED FOR MUSCLE SPASM *SEDATION CAUTION* What changed: See the new instructions.   diltiazem 180 MG 24 hr capsule Commonly known as: CARDIZEM CD TAKE 1 CAPSULE BY MOUTH  DAILY   Flovent HFA 110 MCG/ACT inhaler Generic drug: fluticasone Inhale 2  puffs into the lungs in the morning and at bedtime. Rinse after use.   fluticasone 50 MCG/ACT nasal spray Commonly known as: FLONASE Place 2 sprays into both nostrils daily.   hydrocortisone 2.5 % rectal cream Commonly known as: ANUSOL-HC Place rectally 2 (two) times daily.   isosorbide mononitrate 30 MG 24 hr tablet Commonly known as: IMDUR TAKE 1 TABLET BY MOUTH  DAILY   lansoprazole 30 MG capsule Commonly known as: PREVACID Take 1 capsule by mouth twice daily What changed: when to take this   metoprolol tartrate 25 MG tablet Commonly known as: LOPRESSOR Take 1 tablet (25 mg total) by mouth 2 (two) times daily.   sertraline 100 MG tablet Commonly known as: ZOLOFT Take one and one-half tablets by mouth once daily What changed:  how much to take how to take this when to take this   vitamin B-12 1000 MCG tablet Commonly known as: CYANOCOBALAMIN Take 1,000 mcg by mouth daily.  Vitamin D 50 MCG (2000 UT) Caps Take 2,000 Units by mouth daily.       If you experience worsening of your admission symptoms, develop shortness of breath, life threatening emergency, suicidal or homicidal thoughts you must seek medical attention immediately by calling 911 or calling your MD immediately  if symptoms less severe.  You Must read complete instructions/literature along with all the possible adverse reactions/side effects for all the Medicines you take and that have been prescribed to you. Take any new Medicines after you have completely understood and accept all the possible adverse reactions/side effects.   Please note  You were cared for by a hospitalist during your hospital stay. If you have any questions about your discharge medications or the care you received while you were in the hospital after you are discharged, you can call the unit and asked to speak with the hospitalist on call if the hospitalist that took care of you is not available. Once you are discharged, your primary  care physician will handle any further medical issues. Please note that NO REFILLS for any discharge medications will be authorized once you are discharged, as it is imperative that you return to your primary care physician (or establish a relationship with a primary care physician if you do not have one) for your aftercare needs so that they can reassess your need for medications and monitor your lab values. Today   SUBJECTIVE   No rectal bleed today. No abd pain. Wants to go home  VITAL SIGNS:  Blood pressure (!) 165/77, pulse (!) 48, temperature 98.8 F (37.1 C), resp. rate 15, SpO2 97 %.  I/O:    Intake/Output Summary (Last 24 hours) at 07/31/2020 1233 Last data filed at 07/31/2020 0300 Gross per 24 hour  Intake 120 ml  Output 1300 ml  Net -1180 ml    PHYSICAL EXAMINATION:  GENERAL:  85 y.o.-year-old patient lying in the bed with no acute distress.  LUNGS: Normal breath sounds bilaterally, no wheezing, rales,rhonchi or crepitation. No use of accessory muscles of respiration.  CARDIOVASCULAR: S1, S2 normal. No murmurs, rubs, or gallops.  ABDOMEN: Soft, non-tender, non-distended. Bowel sounds present. No organomegaly or mass.  EXTREMITIES: No pedal edema, cyanosis, or clubbing.  NEUROLOGIC: Cranial nerves II through XII are intact. Muscle strength 5/5 in all extremities. Sensation intact. Gait not checked.  PSYCHIATRIC: The patient is alert and oriented x 3.  SKIN: No obvious rash, lesion, or ulcer.   DATA REVIEW:   CBC  Recent Labs  Lab 07/30/20 0442 07/31/20 0518  WBC 7.0  --   HGB 11.7* 11.3*  HCT 36.0  --   PLT 178  --     Chemistries  Recent Labs  Lab 07/30/20 0442  NA 140  K 3.5  CL 109  CO2 23  GLUCOSE 96  BUN 10  CREATININE 0.74  CALCIUM 8.2*    Microbiology Results   Recent Results (from the past 240 hour(s))  Resp Panel by RT-PCR (Flu A&B, Covid) Nasopharyngeal Swab     Status: None   Collection Time: 07/29/20  9:05 PM   Specimen:  Nasopharyngeal Swab; Nasopharyngeal(NP) swabs in vial transport medium  Result Value Ref Range Status   SARS Coronavirus 2 by RT PCR NEGATIVE NEGATIVE Final    Comment: (NOTE) SARS-CoV-2 target nucleic acids are NOT DETECTED.  The SARS-CoV-2 RNA is generally detectable in upper respiratory specimens during the acute phase of infection. The lowest concentration of SARS-CoV-2 viral copies this assay can  detect is 138 copies/mL. A negative result does not preclude SARS-Cov-2 infection and should not be used as the sole basis for treatment or other patient management decisions. A negative result may occur with  improper specimen collection/handling, submission of specimen other than nasopharyngeal swab, presence of viral mutation(s) within the areas targeted by this assay, and inadequate number of viral copies(<138 copies/mL). A negative result must be combined with clinical observations, patient history, and epidemiological information. The expected result is Negative.  Fact Sheet for Patients:  EntrepreneurPulse.com.au  Fact Sheet for Healthcare Providers:  IncredibleEmployment.be  This test is no t yet approved or cleared by the [place] FDA and  has been authorized for detection and/or diagnosis of SARS-CoV-2 by FDA under an Emergency Use Authorization (EUA). This EUA will remain  in effect (meaning this test can be used) for the duration of the COVID-19 declaration under Section 564(b)(1) of the Act, 21 U.S.C.section 360bbb-3(b)(1), unless the authorization is terminated  or revoked sooner.       Influenza A by PCR NEGATIVE NEGATIVE Final   Influenza B by PCR NEGATIVE NEGATIVE Final    Comment: (NOTE) The Xpert Xpress SARS-CoV-2/FLU/RSV plus assay is intended as an aid in the diagnosis of influenza from Nasopharyngeal swab specimens and should not be used as a sole basis for treatment. Nasal washings and aspirates are unacceptable for  Xpert Xpress SARS-CoV-2/FLU/RSV testing.  Fact Sheet for Patients: EntrepreneurPulse.com.au  Fact Sheet for Healthcare Providers: IncredibleEmployment.be  This test is not yet approved or cleared by the [place] FDA and has been authorized for detection and/or diagnosis of SARS-CoV-2 by FDA under an Emergency Use Authorization (EUA). This EUA will remain in effect (meaning this test can be used) for the duration of the COVID-19 declaration under Section 564(b)(1) of the Act, 21 U.S.C. section 360bbb-3(b)(1), unless the authorization is terminated or revoked.  Performed at [facility], [address] [place], [place] [address]     RADIOLOGY:  CT ABDOMEN PELVIS WO CONTRAST  Result Date: 07/29/2020 CLINICAL DATA:  Low back pain fall EXAM: CT ABDOMEN AND PELVIS WITHOUT CONTRAST TECHNIQUE: Multidetector CT imaging of the abdomen and pelvis was performed following the standard protocol without IV contrast. COMPARISON:  CT 05/17/2016 FINDINGS: Lower chest: Lung bases demonstrate no acute consolidation or effusion. Mild fibrosis within the medial right base potentially osteophyte mediated. Atelectasis or scarring at the right middle lobe. Dense mitral calcification. Borderline cardiac size. Hepatobiliary: Multiple gallstones. No focal hepatic abnormality or biliary dilatation Pancreas: Unremarkable. No pancreatic ductal dilatation or surrounding inflammatory changes. Spleen: Splenic granuloma Adrenals/Urinary Tract: Adrenal glands are normal. Kidneys show no hydronephrosis. The bladder is normal Stomach/Bowel: The stomach is nonenlarged. No dilated small bowel. Left colon diverticular disease without acute wall thickening. Vascular/Lymphatic: Advanced aortic atherosclerosis. No aneurysm. No suspicious nodes Reproductive: Calcified uterine fibroids. Calcified uterine vessels. No adnexal mass Other: Negative for free air or free fluid  Musculoskeletal: No acute or suspicious osseous abnormality IMPRESSION: 1. No CT evidence for acute intra-abdominal or pelvic abnormality. 2. Gallstones 3. Left colon diverticular disease without acute inflammatory change Electronically Signed   By: Donavan Foil M.D.   On: 07/29/2020 20:50   CT Head Wo Contrast  Result Date: 07/29/2020 CLINICAL DATA:  Fall.  Dizziness. EXAM: CT HEAD WITHOUT CONTRAST TECHNIQUE: Contiguous axial images were obtained from the base of the skull through the vertex without intravenous contrast. COMPARISON:  04/23/2020 FINDINGS: Brain: There is no evidence of an acute infarct, intracranial hemorrhage, mass, midline  shift, or extra-axial fluid collection. Hypodensities in the cerebral white matter bilaterally are unchanged and nonspecific but compatible with mild chronic small vessel ischemic disease. Mild cerebral atrophy is within normal limits for age. Vascular: Calcified atherosclerosis at the skull base. No hyperdense vessel. Skull: No acute fracture or suspicious osseous lesion. Normal variant biparietal foramina. Partially visualized old left-sided facial fractures. Sinuses/Orbits: Bilateral cataract extraction.  No acute findings. Other: None. IMPRESSION: 1. No evidence of acute intracranial abnormality. 2. Mild chronic small vessel ischemic disease. Electronically Signed   By: Logan Bores M.D.   On: 07/29/2020 20:46     CODE STATUS:     Code Status Orders  (From admission, onward)         Start     Ordered   07/29/20 2128  Full code  Continuous        07/29/20 2137        Code Status History    Date Active Date Inactive Code Status Order ID Comments User Context   11/19/2019 1838 11/21/2019 1914 Full Code [id number]  Mansy, Arvella Merles, MD ED   02/19/2015 1318 02/19/2015 1955 Full Code [id number]  Yolonda Kida, MD Inpatient   02/17/2015 2336 02/19/2015 1318 Full Code [id number]  Lytle Butte, MD ED   12/23/2011 1644 12/26/2011 1739 Full Code [id number]  Pilar Plate, RN Inpatient   11/01/2011 1747 11/07/2011 1808 Full Code [id number]  Clayburn Pert, RN Inpatient   08/02/2011 1641 08/05/2011 2147 Full Code [id number]  Elise Benne, RN Inpatient   Advance Care Planning Activity       TOTAL TIME TAKING CARE OF THIS PATIENT: *40* minutes.    Fritzi Mandes M.D  [facility]    CC: Primary care physician; Jacqueline Ghent, MD

## 2020-07-31 NOTE — Discharge Instructions (Signed)
DO not take your ELiquis until further instructions from your PCP or GI doctor

## 2020-07-31 NOTE — Plan of Care (Signed)
No bleeding episode overnight. BP 184/68 taken this am. Pt denies any pain. Pt states her BP fluctuates and when it gets high she has a HA. Denies any HA now however she reports having some HA overnight and it subsided. NP on call notified. Safety measures in place. Will continue to monitor.   Problem: Education: Goal: Knowledge of General Education information will improve Description: Including pain rating scale, medication(s)/side effects and non-pharmacologic comfort measures Outcome: Progressing   Problem: Health Behavior/Discharge Planning: Goal: Ability to manage health-related needs will improve Outcome: Progressing   Problem: Clinical Measurements: Goal: Ability to maintain clinical measurements within normal limits will improve Outcome: Progressing Goal: Will remain free from infection Outcome: Progressing Goal: Diagnostic test results will improve Outcome: Progressing Goal: Respiratory complications will improve Outcome: Progressing Goal: Cardiovascular complication will be avoided Outcome: Progressing   Problem: Activity: Goal: Risk for activity intolerance will decrease Outcome: Progressing   Problem: Elimination: Goal: Will not experience complications related to bowel motility Outcome: Progressing Goal: Will not experience complications related to urinary retention Outcome: Progressing   Problem: Pain Managment: Goal: General experience of comfort will improve Outcome: Progressing   Problem: Safety: Goal: Ability to remain free from injury will improve Outcome: Progressing   Problem: Skin Integrity: Goal: Risk for impaired skin integrity will decrease Outcome: Progressing

## 2020-07-31 NOTE — Progress Notes (Signed)
[name] to be D/C'd Home per MD order.  Discussed prescriptions and follow up appointments with the patient. Prescriptions given to patient, medication list explained in detail. Pt verbalized understanding.  Allergies as of 07/31/2020      Reactions   Amoxicillin Swelling   Face hands   Penicillins Swelling, Other (See Comments)   Has patient had a PCN reaction causing immediate rash, facial/tongue/throat swelling, SOB or lightheadedness with hypotension: Yes Has patient had a PCN reaction causing severe rash involving mucus membranes or skin necrosis: No Has patient had a PCN reaction that required hospitalization No phalosporin use.   Valium [diazepam] Other (See Comments)   Pt had shakes   Imdur [isosorbide Nitrate]    Headache at 60mg  dose      Medication List    STOP taking these medications   apixaban 5 MG Tabs tablet Commonly known as: ELIQUIS   ascorbic acid 500 MG tablet Commonly known as: VITAMIN C     TAKE these medications   albuterol 108 (90 Base) MCG/ACT inhaler Commonly known as: VENTOLIN HFA Inhale 2 puffs by mouth every 6 hours as needed for wheezing/shortness of breath What changed: See the new instructions.   ALPRAZolam 0.5 MG tablet Commonly known as: XANAX TAKE 1 TABLET BY MOUTH ONCE DAILY AS NEEDED FOR ANXIETY What changed:   how much to take  how to take this  when to take this  reasons to take this   atorvastatin 80 MG tablet Commonly known as: LIPITOR TAKE 1 TABLET BY MOUTH  DAILY   cyclobenzaprine 5 MG tablet Commonly known as: FLEXERIL TAKE 1 TABLET BY MOUTH THREE TIMES DAILY AS NEEDED FOR MUSCLE SPASM *SEDATION CAUTION* What changed: See the new instructions.   diltiazem 180 MG 24 hr capsule Commonly known as: CARDIZEM CD TAKE 1 CAPSULE BY MOUTH  DAILY Notes to patient: Hold if HR < 60   Flovent HFA 110 MCG/ACT inhaler Generic drug: fluticasone Inhale 2 puffs into the lungs in the morning and at bedtime. Rinse after  use.   fluticasone 50 MCG/ACT nasal spray Commonly known as: FLONASE Place 2 sprays into both nostrils daily.   hydrocortisone 2.5 % rectal cream Commonly known as: ANUSOL-HC Place rectally 2 (two) times daily.   isosorbide mononitrate 30 MG 24 hr tablet Commonly known as: IMDUR TAKE 1 TABLET BY MOUTH  DAILY   lansoprazole 30 MG capsule Commonly known as: PREVACID Take 1 capsule by mouth twice daily What changed: when to take this   metoprolol tartrate 25 MG tablet Commonly known as: LOPRESSOR Take 1 tablet (25 mg total) by mouth 2 (two) times daily. Notes to patient: Hold if HR <60   sertraline 100 MG tablet Commonly known as: ZOLOFT Take one and one-half tablets by mouth once daily What changed:   how much to take  how to take this  when to take this   vitamin B-12 1000 MCG tablet Commonly known as: CYANOCOBALAMIN Take 1,000 mcg by mouth daily.   Vitamin D 50 MCG (2000 UT) Caps Take 2,000 Units by mouth daily.       Vitals:   07/31/20 0441 07/31/20 1114  BP: (!) 184/68 (!) 165/77  Pulse: (!) 53 (!) 48  Resp: 16 15  Temp: 98.4 F (36.9 C) 98.8 F (37.1 C)  SpO2: 99% 97%    Skin clean, dry and intact without evidence of skin break down, no evidence of skin tears noted. IV catheter discontinued intact. Site without signs and  symptoms of complications. Dressing and pressure applied. Pt denies pain at this time. No complaints noted.  An After Visit Summary was printed and given to the patient. Patient escorted via [place], and D/C home via private auto.  Rolley Sims

## 2020-08-02 ENCOUNTER — Other Ambulatory Visit: Payer: Self-pay | Admitting: Family Medicine

## 2020-08-02 DIAGNOSIS — K625 Hemorrhage of anus and rectum: Secondary | ICD-10-CM

## 2020-08-03 ENCOUNTER — Telehealth: Payer: Self-pay

## 2020-08-03 ENCOUNTER — Encounter: Payer: Self-pay | Admitting: *Deleted

## 2020-08-03 ENCOUNTER — Ambulatory Visit: Payer: Medicare Other | Admitting: Family Medicine

## 2020-08-03 MED ORDER — ALBUTEROL SULFATE HFA 108 (90 BASE) MCG/ACT IN AERS: INHALATION_SPRAY | RESPIRATORY_TRACT | 11 refills | Status: DC

## 2020-08-03 MED ORDER — DILTIAZEM HCL ER COATED BEADS 180 MG PO CP24: 180.0000 mg | ORAL_CAPSULE | Freq: Every day | ORAL | 2 refills | Status: DC

## 2020-08-03 NOTE — Telephone Encounter (Signed)
Patient was contacted and had to leave her a voicemail letting her know that she needed to call us back so we could schedule her a colonoscopy per her PCP and Dr. Bonna Gains. Patient is currently holding her Eliquis per [name], therefore, is ready to have the colonoscopy scheduled. Now we are awaiting for her to call us back so we could schedule.
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
## 2020-08-03 NOTE — Telephone Encounter (Signed)
-----   Message from Virgel Manifold, MD sent at 08/03/2020 10:42 AM EDT ----- Ginger not sure if I copied you on the below messages before.   ----- Message ----- From: [name], MD Sent: 08/02/2020  11:09 PM EDT To: Virgel Manifold, MD  Many thanks.   Brigitte Pulse  ----- Message ----- From: Virgel Manifold, MD Sent: 07/31/2020  12:44 PM EDT To: [name], MD, Fritzi Mandes, MD, #  If her Eliquis is being held I can get it set up for next week. She said she can do it any day but Thursday of next week. Ginger can you call her and set her up for Wednesday of next week as I would rather do her at [facility] than [place] thanks.  ----- Message ----- From: Fritzi Mandes, MD Sent: 07/31/2020  12:28 PM EDT To: [name], MD, Virgel Manifold, MD  Dr Damita Dunnings  This pt of yours came in with rectal bleed--now resolved. Hgb 11.2. She is not keen on staying in the hosp for Colonoscopy. Her ELIQUS is held at discharge. She likely has external/internal hemorroids that probably bled. CT abd showed diverticulosis.  She will need referral to see GI (Dr Bonna Gains) who is CCed on this email and also saw pt in the hospital.  Thanks,  Fritzi Mandes

## 2020-08-03 NOTE — Telephone Encounter (Signed)
-----   Message from [place] sent at 08/03/2020 12:23 PM EDT ----- Regarding: Refills Will you please send the following refills to [facility]? Looks like they were both sent elsewhere and pharmacy has requested transfer with no response.   Ventolin HFA 108 (90 Base) MCG/ACT inhaler Diltiazem 180 mg  Thank you,   [name], [place] Assistant [phone]

## 2020-08-03 NOTE — Telephone Encounter (Signed)
Rxs sent

## 2020-08-04 ENCOUNTER — Telehealth: Payer: Self-pay

## 2020-08-04 ENCOUNTER — Other Ambulatory Visit: Payer: Self-pay

## 2020-08-04 DIAGNOSIS — Z659 Problem related to unspecified psychosocial circumstances: Secondary | ICD-10-CM

## 2020-08-04 DIAGNOSIS — K625 Hemorrhage of anus and rectum: Secondary | ICD-10-CM

## 2020-08-04 MED ORDER — NA SULFATE-K SULFATE-MG SULF 17.5-3.13-1.6 GM/177ML PO SOLN: ORAL | 0 refills | Status: DC

## 2020-08-04 NOTE — Chronic Care Management (AMB) (Signed)
Patient states she is not taking her eliquis as she knows she is waiting on a colonoscopy. Patient would also like to hold off on packaging at this time until medications are more stable.    Follow-Up:  Pharmacist Review  [name], CPP notified  [name], [place] Pharmacy Assistant [phone]

## 2020-08-04 NOTE — Telephone Encounter (Signed)
Patient was contacted again and we were able to schedule her colonoscopy for this week since she is on hold of her Plavix per [name]. Patient agreed on getting her colonoscopy done on 08/06/2020 with Dr. Bonna Gains. Instructions were given verbally and sent through [place] per patient's request.

## 2020-08-04 NOTE — Telephone Encounter (Signed)
Patient expressed concerns in hospital and to my assistant regarding lack of resources and social concerns. Recommend referral to CCM social worker.  [name], PharmD Clinical Pharmacist [place] [facility] at [facility] [phone]

## 2020-08-04 NOTE — Telephone Encounter (Signed)
Please call patient and make sure she is not taking her Eliquis (apixaban) and remains off the blood thinner until her colonoscopy. Also - see if she would like to have her medications pillpacked?  [name], PharmD Clinical Pharmacist [place] [facility] at [facility] [phone]

## 2020-08-05 ENCOUNTER — Telehealth: Payer: Self-pay | Admitting: Gastroenterology

## 2020-08-05 NOTE — Telephone Encounter (Signed)
Would like call back to recommend another prep.  They are out of Suprep and patient's procedure is tomorrow

## 2020-08-05 NOTE — Telephone Encounter (Signed)
I put in the referral.  Thanks.  

## 2020-08-06 ENCOUNTER — Ambulatory Visit: Payer: Medicare Other | Admitting: Certified Registered"

## 2020-08-06 ENCOUNTER — Other Ambulatory Visit: Payer: Self-pay

## 2020-08-06 ENCOUNTER — Ambulatory Visit
Admission: RE | Admit: 2020-08-06 | Discharge: 2020-08-06 | Disposition: A | Discharge status: Home / Self Care | Payer: Medicare Other | Attending: Gastroenterology | Admitting: Gastroenterology

## 2020-08-06 ENCOUNTER — Encounter: Payer: Self-pay | Admitting: Gastroenterology

## 2020-08-06 ENCOUNTER — Telehealth: Payer: Self-pay

## 2020-08-06 ENCOUNTER — Encounter
Admission: RE | Disposition: A | Discharge status: Home / Self Care | Payer: Self-pay | Source: Home / Self Care | Attending: Gastroenterology

## 2020-08-06 DIAGNOSIS — D123 Benign neoplasm of transverse colon: Secondary | ICD-10-CM | POA: Diagnosis not present

## 2020-08-06 DIAGNOSIS — D122 Benign neoplasm of ascending colon: Secondary | ICD-10-CM | POA: Insufficient documentation

## 2020-08-06 DIAGNOSIS — K5730 Diverticulosis of large intestine without perforation or abscess without bleeding: Secondary | ICD-10-CM | POA: Diagnosis not present

## 2020-08-06 DIAGNOSIS — Z880 Allergy status to penicillin: Secondary | ICD-10-CM | POA: Insufficient documentation

## 2020-08-06 DIAGNOSIS — Z9049 Acquired absence of other specified parts of digestive tract: Secondary | ICD-10-CM | POA: Diagnosis not present

## 2020-08-06 DIAGNOSIS — Z955 Presence of coronary angioplasty implant and graft: Secondary | ICD-10-CM | POA: Insufficient documentation

## 2020-08-06 DIAGNOSIS — Z79899 Other long term (current) drug therapy: Secondary | ICD-10-CM | POA: Diagnosis not present

## 2020-08-06 DIAGNOSIS — D175 Benign lipomatous neoplasm of intra-abdominal organs: Secondary | ICD-10-CM | POA: Diagnosis not present

## 2020-08-06 DIAGNOSIS — K625 Hemorrhage of anus and rectum: Secondary | ICD-10-CM

## 2020-08-06 DIAGNOSIS — Z96653 Presence of artificial knee joint, bilateral: Secondary | ICD-10-CM | POA: Insufficient documentation

## 2020-08-06 DIAGNOSIS — D1779 Benign lipomatous neoplasm of other sites: Secondary | ICD-10-CM | POA: Diagnosis not present

## 2020-08-06 DIAGNOSIS — Z86718 Personal history of other venous thrombosis and embolism: Secondary | ICD-10-CM | POA: Diagnosis not present

## 2020-08-06 DIAGNOSIS — K635 Polyp of colon: Secondary | ICD-10-CM | POA: Diagnosis not present

## 2020-08-06 DIAGNOSIS — D125 Benign neoplasm of sigmoid colon: Secondary | ICD-10-CM | POA: Diagnosis not present

## 2020-08-06 DIAGNOSIS — Z888 Allergy status to other drugs, medicaments and biological substances status: Secondary | ICD-10-CM | POA: Diagnosis not present

## 2020-08-06 DIAGNOSIS — K6389 Other specified diseases of intestine: Secondary | ICD-10-CM | POA: Insufficient documentation

## 2020-08-06 DIAGNOSIS — K648 Other hemorrhoids: Secondary | ICD-10-CM | POA: Diagnosis not present

## 2020-08-06 DIAGNOSIS — D126 Benign neoplasm of colon, unspecified: Secondary | ICD-10-CM | POA: Diagnosis not present

## 2020-08-06 DIAGNOSIS — E785 Hyperlipidemia, unspecified: Secondary | ICD-10-CM | POA: Diagnosis not present

## 2020-08-06 HISTORY — PX: COLONOSCOPY WITH PROPOFOL: SHX5780

## 2020-08-06 SURGERY — COLONOSCOPY WITH PROPOFOL
Anesthesia: General

## 2020-08-06 MED ORDER — SODIUM CHLORIDE 0.9 % IV SOLN: INTRAVENOUS | Status: DC

## 2020-08-06 MED ORDER — LIDOCAINE 2% (20 MG/ML) 5 ML SYRINGE
INTRAMUSCULAR | Status: DC | PRN
  Administered 2020-08-06: 25 mg via INTRAVENOUS

## 2020-08-06 MED ORDER — PROPOFOL 10 MG/ML IV BOLUS
INTRAVENOUS | Status: DC | PRN
  Administered 2020-08-06 (×2): 20 mg via INTRAVENOUS
  Administered 2020-08-06: 50 mg via INTRAVENOUS
  Administered 2020-08-06: 20 mg via INTRAVENOUS

## 2020-08-06 MED ORDER — PROPOFOL 500 MG/50ML IV EMUL
INTRAVENOUS | Status: DC | PRN
  Administered 2020-08-06: 100 ug/kg/min via INTRAVENOUS
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
## 2020-08-06 NOTE — Op Note (Addendum)
[facility] Patient Name: [name] Procedure Date: 08/06/2020 10:18 AM MRN: [id number] Account #: [id number] Date of Birth: 1935/09/14 Admit Type: Outpatient Age: 85 Room: [facility] ENDO ROOM 2 Gender: Female Note Status: Addendum Procedure:             Colonoscopy Indications:           Screening for colorectal malignant neoplasm Providers:             Anaise Sterbenz B. Bonna Gains MD, MD Referring MD:          Elveria Rising. Damita Dunnings, MD (Referring MD) Medicines:             Monitored Anesthesia Care Complications:         No immediate complications. Procedure:             Pre-Anesthesia Assessment:                        - ASA Grade Assessment: II - A patient with mild                         systemic disease.                        - Prior to the procedure, a History and Physical was                         performed, and patient medications, allergies and                         sensitivities were reviewed. The patient's tolerance                         of previous anesthesia was reviewed.                        - The risks and benefits of the procedure and the                         sedation options and risks were discussed with the                         patient. All questions were answered and informed                         consent was obtained.                        - Patient identification and proposed procedure were                         verified prior to the procedure by the physician, the                         nurse, the anesthesiologist, the anesthetist and the                         technician. The procedure was verified in the                         procedure  room.                        After obtaining informed consent, the colonoscope was                         passed under direct vision. Throughout the procedure,                         the patient's blood pressure, pulse, and oxygen                         saturations were  monitored continuously. The                         Colonoscope was introduced through the anus and                         advanced to the the cecum, identified by appendiceal                         orifice and ileocecal valve. The colonoscopy was                         performed with ease. The patient tolerated the                         procedure well. The quality of the bowel preparation                         was good. Findings:      The perianal and digital rectal examinations were normal.      A 5 mm polyp was found in the cecum. The polyp was sessile. The polyp       was removed with a jumbo cold forceps. Resection and retrieval were       complete.      A 4 mm polyp was found in the ascending colon. The polyp was sessile.       The polyp was removed with a jumbo cold forceps. Resection and retrieval       were complete.      There was a medium-sized lipoma, in the ascending colon. Biopsies were       taken with a cold forceps for histology.      A 6 mm polyp was found in the hepatic flexure. The polyp was sessile.       The polyp was removed with a cold snare. Resection and retrieval were       complete.      An area of mildly thickened folds of the mucosa was found at the hepatic       flexure. Biopsies were taken with a cold forceps for histology. This was       present underneat the polyp removed from the hepatic flexure.      Two sessile polyps were found in the sigmoid colon. The polyps were 4 to       6 mm in size. These polyps were removed with a cold snare. Resection and       retrieval were complete.      An area of mildly thickened folds of the mucosa was found in the  sigmoid       colon. Biopsies were taken with a cold forceps for histology.      Multiple diverticula were found in the sigmoid colon.      The exam was otherwise without abnormality.      Non-bleeding internal hemorrhoids were found during retroflexion. Impression:            - One 5 mm polyp in  the cecum, removed with a jumbo                         cold forceps. Resected and retrieved.                        - One 4 mm polyp in the ascending colon, removed with                         a jumbo cold forceps. Resected and retrieved.                        - Medium-sized lipoma in the ascending colon. Biopsied.                        - One 6 mm polyp at the hepatic flexure, removed with                         a cold snare. Resected and retrieved.                        - Thickened folds of the mucosa at the hepatic                         flexure. Biopsied.                        - Two 4 to 6 mm polyps in the sigmoid colon, removed                         with a cold snare. Resected and retrieved.                        - Thickened folds of the mucosa in the sigmoid colon.                         Biopsied.                        - Diverticulosis in the sigmoid colon.                        - The examination was otherwise normal.                        - Non-bleeding internal hemorrhoids. Recommendation:        - Discharge patient to home (with escort).                        - Pt has an appointment with PCP tomorrow ([name])  and messaged them and they state they would evaluate                         the patient tomorrow in regard to resuming her                         Eliquis. I informed them that since pt has no further                         bleeding, she can resume her Eliquis as soon as PCP or                         cardiology recommends from our standpoint. Pt was made                         aware about this discussion as well.                        - Advance diet as tolerated.                        - Continue present medications.                        - Await pathology results.                        - Repeat colonoscopy date to be determined after                         pending pathology results are reviewed.                        -  The findings and recommendations were discussed with                         the patient.                        - The findings and recommendations were discussed with                         the patient's family.                        - Return to primary care physician as previously                         scheduled.                        - High fiber diet. Procedure Code(s):     --- Professional ---                        [phone], Colonoscopy, flexible; with removal of                         tumor(s), polyp(s), or other lesion(s) by snare  technique                        45380, 59, Colonoscopy, flexible; with biopsy, single                         or multiple Diagnosis Code(s):     --- Professional ---                        K63.5, Polyp of colon                        Z12.11, Encounter for screening for malignant neoplasm                         of colon                        D17.5, Benign lipomatous neoplasm of intra-abdominal                         organs                        K63.89, Other specified diseases of intestine CPT copyright 2019 American Medical Association. All rights reserved. The codes documented in this report are preliminary and upon coder review may  be revised to meet current compliance requirements.  [name], MD Margretta Sidle B. Bonna Gains MD, MD 08/06/2020 11:20:26 AM This report has been signed electronically. Number of Addenda: 1 Note Initiated On: 08/06/2020 10:18 AM Scope Withdrawal Time: 0 hours 28 minutes 37 seconds  Total Procedure Duration: 0 hours 34 minutes 25 seconds  Estimated Blood Loss:  Estimated blood loss: none.      [facility] Addendum Number: 1   Addendum Date: 08/16/2020 3:52:16 PM      The indication for this procedure should read rectal bleeding or       hematochezia on eliquis instead of screening colonoscopy  [name], MD Margretta Sidle B. Bonna Gains MD, MD 08/16/2020 3:52:54  PM This report has been signed electronically.

## 2020-08-06 NOTE — Transfer of Care (Signed)
Immediate Anesthesia Transfer of Care Note  Patient: [name]  Procedure(s) Performed: COLONOSCOPY WITH PROPOFOL (N/A )  Patient Location: Endoscopy Unit  Anesthesia Type:General  Level of Consciousness: drowsy  Airway & Oxygen Therapy: Patient Spontanous Breathing  Post-op Assessment: Report given to RN and Post -op Vital signs reviewed and stable  Post vital signs: Reviewed  Last Vitals:  Vitals Value Taken Time  BP 173/63 08/06/20 1103  Temp    Pulse 69 08/06/20 1104  Resp 22 08/06/20 1104  SpO2 94 % 08/06/20 1104  Vitals shown include unvalidated device data.  Last Pain:  Vitals:   08/06/20 1102  TempSrc:   PainSc: Asleep         Complications: No complications documented.

## 2020-08-06 NOTE — Telephone Encounter (Signed)
Patient was contacted yesterday and a prep was left for the patient to pick up at the front desk which she did. Therefore, she was able to do her procedure today.

## 2020-08-06 NOTE — Telephone Encounter (Addendum)
   Telephone encounter was:  Successful.  08/06/2020 Name: [name] MRN: [id number] DOB: 1936/02/03  [name] is a 85 y.o. year old female who is a primary care patient of [name], MD . The community resource team was consulted for assistance with [place] guide performed the following interventions: Patient provided with information about care guide support team and interviewed to confirm resource needs Spoke with patient she stated that she has enough food and does not need anything at this time.  She said she would be of with going to a food pantry if needed.  Sent letter containing food pantry list to [employer] for [facility] to mail to patient..  Follow Up Plan:  No further follow up planned at this time. The patient has been provided with needed resources.  Khairi Garman, AAS Paralegal, [place] . Embedded Care Coordination [facility]  Care Management  [address], [place] [address] ??[email or web address]  ?? [phone]   [email or web address]

## 2020-08-06 NOTE — Anesthesia Postprocedure Evaluation (Signed)
Anesthesia Post Note  Patient: [name]  Procedure(s) Performed: COLONOSCOPY WITH PROPOFOL (N/A )  Patient location during evaluation: Endoscopy Anesthesia Type: General Level of consciousness: awake and alert Pain management: pain level controlled Vital Signs Assessment: post-procedure vital signs reviewed and stable Respiratory status: spontaneous breathing, nonlabored ventilation, respiratory function stable and patient connected to nasal cannula oxygen Cardiovascular status: blood pressure returned to baseline and stable Postop Assessment: no apparent nausea or vomiting Anesthetic complications: no   No complications documented.   Last Vitals:  Vitals:   08/06/20 1102 08/06/20 1122  BP: (!) 173/63 (!) 190/83  Pulse:    Resp: (!) 21   Temp:    SpO2:      Last Pain:  Vitals:   08/06/20 1122  TempSrc:   PainSc: 0-No pain                 Precious Haws Kaylem Gidney
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
## 2020-08-07 ENCOUNTER — Encounter: Payer: Self-pay | Admitting: Gastroenterology

## 2020-08-07 ENCOUNTER — Ambulatory Visit: Payer: Medicare Other | Admitting: Family Medicine

## 2020-08-07 ENCOUNTER — Telehealth: Payer: Self-pay | Admitting: *Deleted

## 2020-08-07 LAB — SURGICAL PATHOLOGY

## 2020-08-07 MED ORDER — APIXABAN 5 MG PO TABS: 5.0000 mg | ORAL_TABLET | Freq: Two times a day (BID) | ORAL | Status: DC

## 2020-08-07 NOTE — Telephone Encounter (Signed)
Pt called Triage to cancel appt today due to weather. appt r/s to next available 08/13/20. Pt did want to know if it is okay to restart her blood thinner, pt said she had to stop it recently due to procedure and rectal bleeding/ GI issues. Pt wants to restart her blood thinner ASAP and wants to know if PCP thinks it's okay to restart med. She doesn't need a refill has plenty at home just wants the okay to restart

## 2020-08-07 NOTE — Telephone Encounter (Signed)
Thanks for the update.  Okay to restart eliquis as long as she isn't bleeding.  Please tell her that I will be looking forward to seeing her in clinic.  Thanks.

## 2020-08-07 NOTE — Telephone Encounter (Signed)
Thanks

## 2020-08-07 NOTE — Telephone Encounter (Signed)
Spoke with patient granddaughter Caryl Pina (okay per DPR) and advised okay for patient to restart eliquis if she is not bleeding. Caryl Pina verbalized understanding and patient is doing good.

## 2020-08-11 DIAGNOSIS — I259 Chronic ischemic heart disease, unspecified: Secondary | ICD-10-CM | POA: Diagnosis not present

## 2020-08-11 DIAGNOSIS — I10 Essential (primary) hypertension: Secondary | ICD-10-CM | POA: Diagnosis not present

## 2020-08-11 DIAGNOSIS — I480 Paroxysmal atrial fibrillation: Secondary | ICD-10-CM | POA: Diagnosis not present

## 2020-08-11 DIAGNOSIS — E782 Mixed hyperlipidemia: Secondary | ICD-10-CM | POA: Diagnosis not present

## 2020-08-12 ENCOUNTER — Encounter: Payer: Self-pay | Admitting: Gastroenterology

## 2020-08-13 ENCOUNTER — Encounter: Payer: Self-pay | Admitting: Family Medicine

## 2020-08-13 ENCOUNTER — Ambulatory Visit (INDEPENDENT_AMBULATORY_CARE_PROVIDER_SITE_OTHER): Payer: Medicare HMO | Admitting: Family Medicine

## 2020-08-13 ENCOUNTER — Other Ambulatory Visit: Payer: Self-pay

## 2020-08-13 DIAGNOSIS — I4891 Unspecified atrial fibrillation: Secondary | ICD-10-CM

## 2020-08-13 NOTE — Patient Instructions (Signed)
Don't change your meds for now.  I'll await the heart clinic notes.  Take care.  Glad to see you.
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
## 2020-08-16 NOTE — Assessment & Plan Note (Signed)
She sounds to be in sinus rhythm on exam today.  No chest pain or shortness of breath.  Rectal bleeding.  She has heart monitor placed with cardiology follow-up pending and she is back on Eliquis in the meantime without bleeding.  Would continue Eliquis for now.  I will await cardiology notes.  Discussed her home situation.  She safe at home.  She had contact with social work in the meantime.  Discussed restocking groceries.  She will update me as needed.

## 2020-08-18 ENCOUNTER — Telehealth: Payer: Self-pay

## 2020-08-18 NOTE — Chronic Care Management (AMB) (Addendum)
Chronic Care Management Pharmacy Assistant   Name: [name]  MRN: [id number] DOB: August 21, 1935  Reason for Encounter: Disease State- Hypertension    Recent office visits:  08/13/20- Dr. Damita Payne- PCP - Patient states she completed course of cyclobenzaprine. No changes in medications. 08/07/20- Telephone encounter- Patient to resume Eliquis.   Recent consult visits:  08/11/20- Dr. Bartholome Payne- Cardiology- continue with diltiazem and have the patient restart it as she has been off of this for an undetermined amount of time. Follow up 4 weeks.  08/06/20- Colonoscopy- [facility] visits:  None in previous 6 months  Medications: Outpatient Encounter Medications as of 08/18/2020  Medication Sig   albuterol (VENTOLIN HFA) 108 (90 Base) MCG/ACT inhaler Inhale 2 puffs by mouth every 6 hours as needed for wheezing/shortness of breath   ALPRAZolam (XANAX) 0.5 MG tablet TAKE 1 TABLET BY MOUTH ONCE DAILY AS NEEDED FOR ANXIETY   apixaban (ELIQUIS) 5 MG TABS tablet Take 1 tablet (5 mg total) by mouth 2 (two) times daily.   atorvastatin (LIPITOR) 80 MG tablet TAKE 1 TABLET BY MOUTH  DAILY   Cholecalciferol (VITAMIN D) 2000 units CAPS Take 2,000 Units by mouth daily.   diltiazem (CARDIZEM CD) 180 MG 24 hr capsule Take 1 capsule (180 mg total) by mouth daily.   fluticasone (FLONASE) 50 MCG/ACT nasal spray Place 2 sprays into both nostrils daily.   fluticasone (FLOVENT HFA) 110 MCG/ACT inhaler Inhale 2 puffs into the lungs in the morning and at bedtime. Rinse after use.   hydrocortisone (ANUSOL-HC) 2.5 % rectal cream Place rectally 2 (two) times daily.   isosorbide mononitrate (IMDUR) 30 MG 24 hr tablet TAKE 1 TABLET BY MOUTH  DAILY   lansoprazole (PREVACID) 30 MG capsule Take 1 capsule by mouth twice daily   metoprolol tartrate (LOPRESSOR) 25 MG tablet Take 1 tablet (25 mg total) by mouth 2 (two) times daily.   sertraline (ZOLOFT) 100 MG tablet Take one and one-half tablets by mouth once daily    vitamin B-12 (CYANOCOBALAMIN) 1000 MCG tablet Take 1,000 mcg by mouth daily.   No facility-administered encounter medications on file as of 08/18/2020.   Reviewed chart prior to disease state call. Spoke with patient regarding BP  Recent Office Vitals: BP Readings from Last 3 Encounters:  08/13/20 128/76  08/06/20 (!) 190/76  07/31/20 (!) 165/77   Pulse Readings from Last 3 Encounters:  08/13/20 64  08/06/20 60  07/31/20 (!) 48    Wt Readings from Last 3 Encounters:  08/13/20 216 lb (98 kg)  08/06/20 216 lb (98 kg)  04/23/20 223 lb (101.2 kg)     Kidney Function Lab Results  Component Value Date/Time   CREATININE 0.74 07/30/2020 04:42 AM   CREATININE 0.85 07/29/2020 05:10 PM   CREATININE 1.00 02/06/2014 10:47 PM   CREATININE 0.99 02/04/2014 04:05 PM   CREATININE 0.82 03/20/2012 10:56 AM   CREATININE 0.92 02/06/2012 03:11 PM   GFR 86.86 12/03/2019 10:30 AM   GFRNONAA >60 07/30/2020 04:42 AM   GFRNONAA 57 (L) 02/06/2014 10:47 PM   GFRNONAA 71 03/20/2012 10:56 AM   GFRAA >60 11/21/2019 04:41 AM   GFRAA >60 02/06/2014 10:47 PM   GFRAA 82 03/20/2012 10:56 AM    BMP Latest Ref Rng & Units 07/30/2020 07/29/2020 04/23/2020  Glucose 70 - 99 mg/dL 96 106(H) 92  BUN 8 - 23 mg/dL 10 13 12   Creatinine 0.44 - 1.00 mg/dL 0.74 0.85 0.90  Sodium 135 - 145  mmol/L 140 139 140  Potassium 3.5 - 5.1 mmol/L 3.5 3.5 4.1  Chloride 98 - 111 mmol/L 109 107 105  CO2 22 - 32 mmol/L 23 22 25   Calcium 8.9 - 10.3 mg/dL 8.2(L) 8.9 8.6(L)    Current antihypertensive regimen:  Metoprolol tartrate 25 mg - 1 tablet BID Diltiazem 180 mg - 1 capsule daily Isosorbide Mononitrate 30 mg - 1 tablet daily   Clonidine (not taking)  How often are you checking your Blood Pressure? Not checking at this time. States she just found her monitor. She notes this is an arm monitor. She agrees to start checking.   Current home BP readings: No readings available.   What recent interventions/DTPs have been made  by any provider to improve Blood Pressure control since last CPP Visit:  Dr. Bartholome Payne- Cardiology- continue with diltiazem and have the patient restart it as she has been off of this for an undetermined amount of time.  Any recent hospitalizations or ED visits since last visit with CPP? No- did have colonoscopy at [facility]   What diet changes have been made to improve Blood Pressure Control?  N/A  What exercise is being done to improve your Blood Pressure Control?  Likes to get outside and work in the yard. States she does exercises in her bed. She states she has been throwing a ball to her grandson to hit with a baseball bat. She sits in a chair to play with him.   Adherence Review: Is the patient currently on ACE/ARB medication? No Does the patient have >5 day gap between last estimated fill dates? No  Star Rating Drugs: Medication Name Fill Date Days Supply Atorvastatin 80 mg 08/06/20 30  Patient states she was able to restock her food and someone donated quite a bit of canned goods to her. Patient states she has been feeling wonderful. She states that she is to take her heart monitor off today and return it to cardiology. Patient asked to check blood pressure a few times over the next couple weeks and I would call her back in about 2 weeks. Patient agreed.   Follow-Up:  Pharmacist Review  [name], CPP notified  [name], [place] Assistant [phone]  I have reviewed the care management and care coordination activities outlined in this encounter and I am certifying that I agree with the content of this note. No further action required.  [name], PharmD Clinical Pharmacist [facility] at [facility] [phone]

## 2020-08-28 DIAGNOSIS — R6889 Other general symptoms and signs: Secondary | ICD-10-CM | POA: Diagnosis not present

## 2020-08-28 DIAGNOSIS — I10 Essential (primary) hypertension: Secondary | ICD-10-CM | POA: Diagnosis not present

## 2020-08-28 DIAGNOSIS — I480 Paroxysmal atrial fibrillation: Secondary | ICD-10-CM | POA: Diagnosis not present

## 2020-08-31 ENCOUNTER — Telehealth: Payer: Self-pay

## 2020-08-31 NOTE — Chronic Care Management (AMB) (Addendum)
Chronic Care Management Pharmacy Assistant   Name: RANDALYN AHMED  MRN: [id number] DOB: 01-08-1936  Reason for Encounter: Medication Adherence and Delivery Coordination   Recent office visits:  08/13/20- [name]- PCP - Patient states she completed course of cyclobenzaprine. No changes in medications. 08/07/20- Telephone encounter- Patient to resume Eliquis.   Recent consult visits:  08/11/20- Dr. Bartholome Bill- Cardiology- continue with diltiazem and have the patient restart it as she has been off of this for an undetermined amount of time. Follow up 4 weeks. 08/06/20- Colonoscopy- [facility] visits:  None in previous 6 months  Medications: Outpatient Encounter Medications as of 08/31/2020  Medication Sig   albuterol (VENTOLIN HFA) 108 (90 Base) MCG/ACT inhaler Inhale 2 puffs by mouth every 6 hours as needed for wheezing/shortness of breath   ALPRAZolam (XANAX) 0.5 MG tablet TAKE 1 TABLET BY MOUTH ONCE DAILY AS NEEDED FOR ANXIETY   apixaban (ELIQUIS) 5 MG TABS tablet Take 1 tablet (5 mg total) by mouth 2 (two) times daily.   atorvastatin (LIPITOR) 80 MG tablet TAKE 1 TABLET BY MOUTH  DAILY   Cholecalciferol (VITAMIN D) 2000 units CAPS Take 2,000 Units by mouth daily.   diltiazem (CARDIZEM CD) 180 MG 24 hr capsule Take 1 capsule (180 mg total) by mouth daily.   fluticasone (FLONASE) 50 MCG/ACT nasal spray Place 2 sprays into both nostrils daily.   fluticasone (FLOVENT HFA) 110 MCG/ACT inhaler Inhale 2 puffs into the lungs in the morning and at bedtime. Rinse after use.   hydrocortisone (ANUSOL-HC) 2.5 % rectal cream Place rectally 2 (two) times daily.   isosorbide mononitrate (IMDUR) 30 MG 24 hr tablet TAKE 1 TABLET BY MOUTH  DAILY   lansoprazole (PREVACID) 30 MG capsule Take 1 capsule by mouth twice daily   metoprolol tartrate (LOPRESSOR) 25 MG tablet Take 1 tablet (25 mg total) by mouth 2 (two) times daily.   sertraline (ZOLOFT) 100 MG tablet Take one and one-half tablets by  mouth once daily   vitamin B-12 (CYANOCOBALAMIN) 1000 MCG tablet Take 1,000 mcg by mouth daily.   No facility-administered encounter medications on file as of 08/31/2020.   BP Readings from Last 3 Encounters:  08/13/20 128/76  08/06/20 (!) 190/76  07/31/20 (!) 165/77    Lab Results  Component Value Date   HGBA1C 5.9 10/17/2011     Last adherence delivery date: 08/14/20      Patient is due for next adherence delivery on: 09/09/20- but states she has about 6 weeks of medication on hand. No delivery needed this month.   Spoke with patient on 08/31/20 and reviewed medications to ensure patient does not need any medications at this time.  This delivery to include: Vials  30 Days  Alprazolam 0.5 mg- take 1 tablet daily PRN- Acute form entered for 09/11/20 delivery.  Patient declined the following medications this month: *States she has not opened her most recent delivery of medications, she is finishing up her opened bottles (states in date) and then will start unopened medications.* Isosorbide Mono 30 mg ER - 1 tablet daily (breakfast) Vitamin D3 50 mcg 2000 units - 1 tablet daily (evening meal) Vitamin B-12 1000 mcg vitamin B-12 (CYANOCOBALAMIN) 1000 MCG 1 tablet daily (evening meal) Albuterol Aer Hfa - Inhale 2 puffs every 6 hours PRN  Fluticasone Prop 50 MCG/ACT - place 2 sprays in each nostril daily    Flovent HFA - Inhale 2 puffs into the lungs AM and PM  Lansoprazole 30 mg- 1 tablet twice daily (breakfast, evening meal) Diltiazem 180 mg- 1 tablet daily (breakfast) Atorvastatin 80 mg- 1 tablet daily (bedtime) Metoprolol Tart 25 mg 1 tablet twice daily (breakfast and evening meal) Eliquis  5 mg -1 tablet twice daily (breakfast, evening meal) Sertraline 100 mg - 1.5 tablet daily (breakfast)   Refills requested from PCP include: Alprazolam requested 08/31/20.    She will not need adherence medications until about 10/09/20. She denies missing any doses. States she knows she has not  missed a dose in the last 3 weeks.   Recent blood pressure readings are as follows: States she recently found her blood pressure monitor and has been checking often.   144/60, 57 151/77, 54 152/75, 56 157/73, 67 143/75, 61 163/69, 51 155/70, 57 155/60, 56  Follow-Up:  Comptroller and Pharmacist Review  [name], CPP notified  [name], [place] [phone]  I have reviewed the care management and care coordination activities outlined in this encounter and I am certifying that I agree with the content of this note. Schedule visit.  [name], PharmD Clinical Pharmacist [facility] at [facility] [phone]

## 2020-09-01 ENCOUNTER — Other Ambulatory Visit: Payer: Self-pay

## 2020-09-01 NOTE — Telephone Encounter (Signed)
Refill request for Alprazolam 0.5 mg tablets  LOV - 08/13/20 Next OV - not scheduled Last refill -  04/10/20 #30/2

## 2020-09-01 NOTE — Telephone Encounter (Signed)
-----   Message from [place] sent at 08/31/2020  9:58 AM EDT ----- Regarding: Refill Patient needs refill on Alprazolam if appropriate. She is due for refill and delivery on 09/11/20.  [name], [place] Pharmacy Assistant [phone]

## 2020-09-02 MED ORDER — ALPRAZOLAM 0.5 MG PO TABS: ORAL_TABLET | ORAL | 2 refills | Status: DC

## 2020-09-02 NOTE — Telephone Encounter (Signed)
Sent. Thanks.   

## 2020-09-04 NOTE — Chronic Care Management (AMB) (Signed)
Contacted patient to set up follow up appointment with [name] to review recent blood pressure readings. [name] was scheduled for telephone visit with [name] on 09/07/20 at 10:00.  she was reminded to have all medications, supplements and any blood glucose and blood pressure readings available for review at appointment.  [name], CPP notified  [name], [place] [facility] Assistant [phone]

## 2020-09-07 ENCOUNTER — Ambulatory Visit (INDEPENDENT_AMBULATORY_CARE_PROVIDER_SITE_OTHER): Payer: Medicare HMO

## 2020-09-07 ENCOUNTER — Other Ambulatory Visit: Payer: Self-pay

## 2020-09-07 ENCOUNTER — Ambulatory Visit: Payer: Medicare HMO | Admitting: *Deleted

## 2020-09-07 ENCOUNTER — Telehealth: Payer: Self-pay | Admitting: *Deleted

## 2020-09-07 ENCOUNTER — Telehealth: Payer: Self-pay

## 2020-09-07 DIAGNOSIS — F341 Dysthymic disorder: Secondary | ICD-10-CM

## 2020-09-07 DIAGNOSIS — H353211 Exudative age-related macular degeneration, right eye, with active choroidal neovascularization: Secondary | ICD-10-CM | POA: Diagnosis not present

## 2020-09-07 DIAGNOSIS — E785 Hyperlipidemia, unspecified: Secondary | ICD-10-CM | POA: Diagnosis not present

## 2020-09-07 DIAGNOSIS — I259 Chronic ischemic heart disease, unspecified: Secondary | ICD-10-CM | POA: Diagnosis not present

## 2020-09-07 DIAGNOSIS — I10 Essential (primary) hypertension: Secondary | ICD-10-CM | POA: Diagnosis not present

## 2020-09-07 DIAGNOSIS — Z659 Problem related to unspecified psychosocial circumstances: Secondary | ICD-10-CM

## 2020-09-07 DIAGNOSIS — H43813 Vitreous degeneration, bilateral: Secondary | ICD-10-CM | POA: Diagnosis not present

## 2020-09-07 DIAGNOSIS — H3540 Unspecified peripheral retinal degeneration: Secondary | ICD-10-CM | POA: Diagnosis not present

## 2020-09-07 DIAGNOSIS — R6889 Other general symptoms and signs: Secondary | ICD-10-CM | POA: Diagnosis not present

## 2020-09-07 DIAGNOSIS — H353122 Nonexudative age-related macular degeneration, left eye, intermediate dry stage: Secondary | ICD-10-CM | POA: Diagnosis not present

## 2020-09-07 NOTE — Patient Instructions (Signed)
Dear [name],  Below is a summary of the goals we discussed during our follow up appointment on September 07, 2020. Please contact me anytime with questions or concerns.   Visit Information Long-Range Goal: Disease Management   Start Date: 09/07/2020  Priority: High  Note:    Current Barriers:  Medications are not synced due to multiple changes in therapy/hospitalizations  Elevated home blood pressure  Pharmacist Clinical Goal(s):  Patient will contact provider office for questions/concerns as evidenced notation of same in electronic health record through collaboration with PharmD and provider.   Interventions: 1:1 collaboration with [name], MD regarding development and update of comprehensive plan of care as evidenced by provider attestation and co-signature Inter-disciplinary care team collaboration (see longitudinal plan of care) Comprehensive medication review performed; medication list updated in electronic medical record  Hypertension/CAD (BP goal <140/90) Query Controlled - elevated at home lately  -Current treatment: Metoprolol tartrate 25 mg - 1 tablet BID Diltiazem 180 mg - 1 capsule daily Isosorbide Mononitrate 30 mg - 1 tablet daily   -Medications previously tried: none  -Current home readings: using arm cuff at home  144/60, 57 151/77, 54 152/75, 56 157/73, 67 143/75, 61 163/69, 51 155/70, 57 155/60, 56 -Reports occasional dizziness. Denies any symptoms of high blood pressure including chest pain or shortness of breath. She is feeling good. She thinks BP has been a little high due to stress about finances. She affirms adherence to above medications as prescribed, although she did missed her evening medications last night. Prior to last night, she had not missed any doses in 3 weeks.  -Educated on BP goals and benefits of medications for prevention of heart attack, stroke and kidney damage; -Counseled to monitor BP at home weekly, document, and provide  log at future appointments -Recommended to continue current medication; Will coordinate visit with PCP or RN for HTN review.   Hyperlipidemia: (LDL goal < 70) -Not ideally controlled - LDL 80 (due for re-eval) -Current treatment: Atorvastatin 80 mg - 1 tablet daily  -Medications previously tried: none  -She confirms daily adherence and denies missed doses. -Recommended to continue current medication; She has a full bottle on hand, but denies missed doses. She will call when low so we can re-sync.  Atrial Fibrillation (Goal: prevent stroke and major bleeding) -Followed by cardiology  -Current treatment: Metoprolol tartrate 25 mg - 1 tablet BID  Diltiazem 180 mg - 1 capsule daily Anticoagulation: Eliquis 5 mg - 1 tablet BID -Medications previously tried: none  -Home BP and HR readings: none. Elevated - see log above -Dosing appropriate per age, weight, and Scr. -Denies bleeding/bruising. Denies any falls.  -Recommended to continue current medication  Depression/Anxiety (Goal: Control symptoms) -Controlled (per patient report) -Current treatment: Sertraline 100 mg - 1 and 1/2 tablet daily  -Medications previously tried/failed: none -Patient reports doing well on current dose (150 mg daily)  -Educated on Benefits of medication for symptom control importance of avoiding missed doses  -Recommended to continue current medication  GERD (Goal: Improve symptoms) -Controlled (per patient report) -Current treatment  Lansoprazole 30 mg - 1 capsule twice daily before meals   -Medications previously tried: none reported -Patient denies breakthrough symptoms. Confirms she takes before meals BID. -Recommended to continue current medication  Patient Goals/Self-Care Activities Patient will:  - take medications as prescribed - call 1 week prior to medications running out  -check blood pressure at home, document, and provide at future appointments  Follow Up Plan: The care  management team  will reach out to the patient again over the next 30 days -  CMA will call for BP log.     The patient verbalized understanding of instructions, educational materials, and care plan provided today and declined offer to receive copy of patient instructions, educational materials, and care plan.   [name], PharmD Clinical Pharmacist [facility] at [facility] [phone]

## 2020-09-07 NOTE — Telephone Encounter (Signed)
Patient reports the following BP log. I wondered if she could see [name] or Larene Beach for evaluation/comparison of home to in office? I am not certain her home BP measurements are accurate. She does use an arm cuff. 144/60, 57 151/77, 54 152/75, 56 157/73, 67 143/75, 61 163/69, 51 155/70, 57 155/60, [place], PharmD Corporate treasurer Primary Care at [facility] [phone]

## 2020-09-07 NOTE — Telephone Encounter (Signed)
Please see about seeing up OV to recheck BP.  Please ask her to bring her cuff to the visit to calibrate it.  Thanks.

## 2020-09-07 NOTE — Telephone Encounter (Signed)
LMTCB

## 2020-09-07 NOTE — Progress Notes (Signed)
Chronic Care Management Pharmacy Note  09/07/2020 Name:  [name] MRN:  [id number] DOB:  21-Feb-1936  Summary: Home BP are elevated -150-160s/70s No adherence concerns identified. Patient reports some financial concerns. Reached out to Marcie Bal our Education officer, museum to contact patient to discuss.  Recommendations: Visit with RN or PCP to review BP cuff accuracy and check BP  Plan: CCM follow up in 30 days for BP log  Subjective: [name] is an 86 y.o. year old female who is a primary patient of Damita Dunnings, Elveria Rising, MD.  The CCM team was consulted for assistance with disease management and care coordination needs.    Engaged with patient by telephone for follow up visit in response to provider referral for pharmacy case management and/or care coordination services.   Consent to Services:  The patient was given information about Chronic Care Management services, agreed to services, and gave verbal consent prior to initiation of services.  Please see initial visit note for detailed documentation.   Patient Care Team: [name], MD as PCP - General (Family Medicine) Ubaldo Glassing Javier Docker, MD as Consulting Physician (Cardiology) Birder Robson, MD as Referring Physician (Ophthalmology) [name], [facility] as Pharmacist (Pharmacist)  Recent office visits: 08/13/20- [name], PCP - Continue current medications for now. Will await cardiology notes. 08/07/20- Telephone encounter - Resume Eliquis.    Recent consult visits:  08/11/20- Dr. Bartholome Bill, Cardiology- Resume diltiazem for AFIB control. She has been off for undetermined amount of time. Follow up 4 weeks. 08/06/20- Colonoscopy- [facility] visits: 07/29/20 - ED to hospital - Diverticulosis  04/23/20 - ED - Vision disturbance  Objective:  Lab Results  Component Value Date   CREATININE 0.74 07/30/2020   BUN 10 07/30/2020   GFR 86.86 12/03/2019   GFRNONAA >60 07/30/2020   GFRAA >60 11/21/2019   NA 140 07/30/2020    K 3.5 07/30/2020   CALCIUM 8.2 (L) 07/30/2020   CO2 23 07/30/2020   GLUCOSE 96 07/30/2020    Lab Results  Component Value Date/Time   HGBA1C 5.9 10/17/2011 11:51 AM   GFR 86.86 12/03/2019 10:30 AM   GFR 84.59 09/06/2018 11:10 AM   MICROALBUR 0.3 06/22/2009 08:35 AM   MICROALBUR 1.2 09/18/2008 09:48 AM     Lab Results  Component Value Date   CHOL 150 09/06/2018   HDL 52.00 09/06/2018   LDLCALC 80 09/06/2018   LDLDIRECT 94.7 01/30/2014   TRIG 87.0 09/06/2018   CHOLHDL 3 09/06/2018    Hepatic Function Latest Ref Rng & Units 04/23/2020 12/03/2019 11/21/2019  Total Protein 6.5 - 8.1 g/dL 6.2(L) 6.1 6.6  Albumin 3.5 - 5.0 g/dL 3.1(L) 3.1(L) 3.0(L)  AST 15 - 41 U/L '20 14 27  ' ALT 0 - 44 U/L '12 10 14  ' Alk Phosphatase 38 - 126 U/L 71 69 59  Total Bilirubin 0.3 - 1.2 mg/dL 0.6 0.6 1.0  Bilirubin, Direct 0.0 - 0.2 mg/dL - - -    Lab Results  Component Value Date/Time   TSH 1.54 09/06/2018 11:10 AM   TSH 1.47 12/05/2016 01:37 PM    CBC Latest Ref Rng & Units 07/31/2020 07/30/2020 07/30/2020  WBC 4.0 - 10.5 K/uL - 7.0 8.0  Hemoglobin 12.0 - 15.0 g/dL 11.3(L) 11.7(L) 11.1(L)  Hematocrit 36.0 - 46.0 % - 36.0 34.0(L)  Platelets 150 - 400 K/uL - 178 176    Lab Results  Component Value Date/Time   VD25OH 45.28 09/06/2018 11:10 AM   VD25OH 42.92 09/01/2016  12:47 PM    Clinical ASCVD: Yes  The ASCVD Risk score Mikey Bussing DC Jr., et al., 2013) failed to calculate for the following reasons:   The 2013 ASCVD risk score is only valid for ages 2 to 41    Depression screen PHQ 2/9 09/08/2016 03/20/2012  Decreased Interest 1 0  Down, Depressed, Hopeless 3 0  PHQ - 2 Score 4 0  Altered sleeping 0 -  Tired, decreased energy 3 -  Change in appetite 1 -  Feeling bad or failure about yourself  0 -  Trouble concentrating 0 -  Moving slowly or fidgety/restless 0 -  Suicidal thoughts 0 -  PHQ-9 Score 8 -  Difficult doing work/chores Not difficult at all -  Some recent data might be hidden      Social History   Tobacco Use  Smoking Status Never  Smokeless Tobacco Never   BP Readings from Last 3 Encounters:  08/13/20 128/76  08/06/20 (!) 190/76  07/31/20 (!) 165/77   Pulse Readings from Last 3 Encounters:  08/13/20 64  08/06/20 60  07/31/20 (!) 48   Wt Readings from Last 3 Encounters:  08/13/20 216 lb (98 kg)  08/06/20 216 lb (98 kg)  04/23/20 223 lb (101.2 kg)   BMI Readings from Last 3 Encounters:  08/13/20 35.94 kg/m  08/06/20 34.86 kg/m  04/23/20 35.99 kg/m    Assessment/Interventions: Review of patient past medical history, allergies, medications, health status, including review of consultants reports, laboratory and other test data, was performed as part of comprehensive evaluation and provision of chronic care management services.   SDOH:  (Social Determinants of Health) assessments and interventions performed: Yes SDOH Interventions    Flowsheet Row Most Recent Value  SDOH Interventions   Financial Strain Interventions Intervention Not Indicated        CCM Care Plan  Allergies  Allergen Reactions   Amoxicillin Swelling    Face hands   Penicillins Swelling and Other (See Comments)    Has patient had a PCN reaction causing immediate rash, facial/tongue/throat swelling, SOB or lightheadedness with hypotension: Yes Has patient had a PCN reaction causing severe rash involving mucus membranes or skin necrosis: No Has patient had a PCN reaction that required hospitalization No phalosporin use.   Valium [Diazepam] Other (See Comments)    Pt had shakes   Imdur [Isosorbide Nitrate]     Headache at 31m dose    Medications Reviewed Today     Reviewed by DTonia Ghent MD (Physician) on 08/13/20 at 1[place]opolisList Status: <None>   Medication Order Taking? Sig Documenting Provider Last Dose Status Informant  albuterol (VENTOLIN HFA) 108 (90 Base) MCG/ACT inhaler [id number] Inhale 2 puffs by mouth every 6 hours as needed for  wheezing/shortness of breath DTonia Ghent MD Taking Active   ALPRAZolam (Duanne Moron 0.5 MG tablet [id number] TAKE 1 TABLET BY MOUTH ONCE DAILY AS NEEDED FOR ANXIETY DTonia Ghent MD Taking Active   apixaban (ELIQUIS) 5 MG TABS tablet [id number] Take 1 tablet (5 mg total) by mouth 2 (two) times daily. DTonia Ghent MD Taking Active   atorvastatin (LIPITOR) 80 MG tablet [id number] TAKE 1 TABLET BY MOUTH  DAILY DTonia Ghent MD Taking Active   Cholecalciferol (VITAMIN D) 2000 units CAPS [id number] Take 2,000 Units by mouth daily. [provider] Taking Active Family Member  diltiazem (CARDIZEM CD) 180 MG 24 hr capsule [id number] Take 1 capsule (180 mg total) by  mouth daily. [name], MD Taking Active   fluticasone [facility]) 50 MCG/ACT nasal spray [id number] Yes Place 2 sprays into both nostrils daily. [name], MD Taking Active Family Member  fluticasone [facility]) 110 MCG/ACT inhaler [id number] Yes Inhale 2 puffs into the lungs in the morning and at bedtime. Rinse after use. [name], MD Taking Active Family Member  hydrocortisone [facility]) 2.5 % rectal cream [id number] Yes Place rectally 2 (two) times daily. Fritzi Mandes, MD Taking Active   isosorbide mononitrate (IMDUR) 30 MG 24 hr tablet [id number] Yes TAKE 1 TABLET BY MOUTH  DAILY [name], MD Taking Active   lansoprazole (PREVACID) 30 MG capsule [id number] Yes Take 1 capsule by mouth twice daily [name], MD Taking Active   metoprolol tartrate (LOPRESSOR) 25 MG tablet [id number] Yes Take 1 tablet (25 mg total) by mouth 2 (two) times daily. [name], MD Taking Expired 05/13/20 2359   sertraline (ZOLOFT) 100 MG tablet [id number] Yes Take one and one-half tablets by mouth once daily [name], MD Taking Active   vitamin B-12 (CYANOCOBALAMIN) 1000 MCG tablet [id number] Yes Take 1,000 mcg by mouth daily. [provider] Taking Active Family Member             Patient Active Problem List   Diagnosis Date Noted   Rectal bleeding    Cecal polyp    Lipoma of colon    Polyp of ascending colon    Polyp of sigmoid colon    Diverticulosis    GI bleed 07/29/2020   Vision changes 04/26/2020   Foot callus 12/30/2019   Hypoxia    COVID-19 11/19/2019   Urinary frequency 04/14/2019   Gout 08/21/2017   Vision loss 07/30/2017   Headache 07/30/2017   Syncope 12/08/2016   Atrial fibrillation ([place]) 12/05/2016   Advance care planning 09/09/2016   Fatigue 01/20/2016   Arthritis 04/13/2015   Rhinitis 02/25/2015   Osteoarthritis of both knees 11/13/2014   Primary osteoarthritis of both knees 11/13/2014   Cough 11/13/2013   UTI (urinary tract infection) 11/21/2012   Flank pain 11/21/2012   GERD (gastroesophageal reflux disease)    Back pain 05/04/2011   URI (upper respiratory infection) 10/27/2010   Hand pain 10/27/2010   Osteoporosis 08/23/2010   UNSPECIFIED VITAMIN D DEFICIENCY 09/23/2008   DEPRESSION/ANXIETY 09/23/2008   Obesity (BMI 30.0-34.9) 06/21/2006   CORONARY ARTERY DISEASE, S/P PTCA 06/21/2006   Extrinsic asthma 06/21/2006   Essential hypertension 06/13/1999   Hyperlipidemia 03/15/1999    Immunization History  Administered Date(s) Administered   Fluad Quad(high Dose 65+) 03/18/2019, 12/26/2019   Influenza Split 12/23/2011   Influenza,inj,Quad PF,6+ Mos 01/30/2014, 04/09/2015, 04/08/2016, 12/05/2016   Pneumococcal Conjugate-13 11/13/2014   Pneumococcal Polysaccharide-23 10/05/2009   Td 09/23/2008   Unspecified SARS-COV-2 Vaccination 05/13/2019, 06/03/2019    Conditions to be addressed/monitored:  Hypertension, Hyperlipidemia, Atrial Fibrillation, Coronary Artery Disease, GERD, Asthma, Depression and Anxiety  Care Plan : [place]  Updates made by [name], [place] since 09/07/2020 12:00 AM     Problem: CHL AMB "PATIENT-SPECIFIC PROBLEM"      Long-Range Goal: Disease Management   Start Date: 09/07/2020   Priority: High  Note:    Current Barriers:  Medications are not synced due to multiple changes in therapy/hospitalizations  Elevated home blood pressure  Pharmacist Clinical Goal(s):  Patient will contact provider office for questions/concerns as evidenced notation of same in electronic health record through collaboration with PharmD and provider.  Interventions: 1:1 collaboration with [name], MD regarding development and update of comprehensive plan of care as evidenced by provider attestation and co-signature Inter-disciplinary care team collaboration (see longitudinal plan of care) Comprehensive medication review performed; medication list updated in electronic medical record  Hypertension/CAD (BP goal <140/90) Query Controlled - elevated at home lately  -Current treatment: Metoprolol tartrate 25 mg - 1 tablet BID Diltiazem 180 mg - 1 capsule daily Isosorbide Mononitrate 30 mg - 1 tablet daily   -Medications previously tried: none  -Current home readings: using arm cuff at home  144/60, 57 151/77, 54 152/75, 56 157/73, 67 143/75, 61 163/69, 51 155/70, 57 155/60, 56 -Reports occasional dizziness. Denies any symptoms of high blood pressure including chest pain or shortness of breath. She is feeling good. She thinks BP has been a little high due to stress about finances. She affirms adherence to above medications as prescribed, although she did missed her evening medications last night. Prior to last night, she had not missed any doses in 3 weeks.  -Educated on BP goals and benefits of medications for prevention of heart attack, stroke and kidney damage; -Counseled to monitor BP at home weekly, document, and provide log at future appointments -Recommended to continue current medication; Will coordinate visit with PCP or RN for HTN review.   Hyperlipidemia: (LDL goal < 70) -Not ideally controlled - LDL 80 (due for re-eval) -Current treatment: Atorvastatin 80 mg - 1  tablet daily  -Medications previously tried: none  -She confirms daily adherence and denies missed doses. -Recommended to continue current medication; She has a full bottle on hand, but denies missed doses. She will call when low so we can re-sync.  Atrial Fibrillation (Goal: prevent stroke and major bleeding) -Followed by cardiology  -Current treatment: Metoprolol tartrate 25 mg - 1 tablet BID  Diltiazem 180 mg - 1 capsule daily Anticoagulation: Eliquis 5 mg - 1 tablet BID -Medications previously tried: none  -Home BP and HR readings: none. Elevated - see log above -Dosing appropriate per age, weight, and Scr. -Denies bleeding/bruising. Denies any falls.  -Recommended to continue current medication  Depression/Anxiety (Goal: Control symptoms) -Controlled (per patient report) -Current treatment: Sertraline 100 mg - 1 and 1/2 tablet daily  -Medications previously tried/failed: none -Patient reports doing well on current dose (150 mg daily)  -Educated on Benefits of medication for symptom control importance of avoiding missed doses  -Recommended to continue current medication  GERD (Goal: Improve symptoms) -Controlled (per patient report) -Current treatment  Lansoprazole 30 mg - 1 capsule twice daily before meals   -Medications previously tried: none reported -Patient denies breakthrough symptoms. Confirms she takes before meals BID. -Recommended to continue current medication  Patient Goals/Self-Care Activities Patient will:  - take medications as prescribed - call 1 week prior to medications running out  -check blood pressure at home, document, and provide at future appointments  Follow Up Plan: The care management team will reach out to the patient again over the next 30 days -  CMA will call for BP log.       Medication Assistance: None required.  Patient affirms current coverage meets needs.  Patient's preferred pharmacy is:  [facility] - [place], [place] -  [address] [place] [address] Phone: [phone] Fax: [phone]  [facility]  ([place] - [place], [place] [address] Phone:  [phone] Fax: [phone]  Uses pill box? Yes Pt endorses 90% compliance - May miss her evening medications 1-2 times per month Fills two weeks at a time, uses a pillbox with morning and evening dosing   Medication Review: -Patient able to state names, dose, frequency, and time of day   Breakfast: Diltiazem Metoprolol (7 left) Atorvastatin Eliquis (1 week left) Lansoprazole Sertraline  (1 week left) Isosorbide Mononitrate Vitamin C 500 mg - 1 tablet daily  Vitamin D3 1000 IU - 1 capsule daily (a few left) Vitamin B12 1000 mcg - 1 tablet daily   Evening Meal Lansoprazole  Eliquis Metoprolol  PRN: Xanax - takes PRN anxiety, usually once at bedtime   Asthma: Flonase Nasal - once daily as needed Albuterol - using about once daily  Flovent 110 mcg/act - 2 puffs twice daily  Delivery scheduled for 6/29 - Add Eliquis, metoprolol, vitamin D3, and sertraline 30 DS  Care Plan and Follow Up Patient Decision:  Patient agrees to Care Plan and Follow-up.  [name], PharmD Clinical Pharmacist [place] [facility] at [facility] [place] [facility] [phone]

## 2020-09-08 ENCOUNTER — Telehealth: Payer: Medicare HMO
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
## 2020-09-09 ENCOUNTER — Telehealth: Payer: Self-pay

## 2020-09-09 DIAGNOSIS — I480 Paroxysmal atrial fibrillation: Secondary | ICD-10-CM | POA: Diagnosis not present

## 2020-09-09 MED ORDER — SERTRALINE HCL 100 MG PO TABS: ORAL_TABLET | ORAL | 1 refills | Status: DC

## 2020-09-09 MED ORDER — SERTRALINE HCL 100 MG PO TABS: ORAL_TABLET | ORAL | 3 refills | Status: DC

## 2020-09-09 NOTE — Telephone Encounter (Signed)
Can you send in a new prescription for sertraline to [facility]? We have been unable to retrieve the prescription from previous pharmacy, [facility]. Patient is almost out of medication.  [name], PharmD Clinical Pharmacist [place] [facility] at [facility] [phone]

## 2020-09-09 NOTE — Telephone Encounter (Signed)
Sent. Thanks.   

## 2020-09-09 NOTE — Telephone Encounter (Signed)
LMTCB
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
## 2020-09-09 NOTE — Patient Instructions (Addendum)
Visit Information  PATIENT GOALS:  Goals Addressed             This Visit's Progress    Find Help in My Community       Timeframe:  Short-Term Goal Priority:  High Start Date: 09/07/20                             Expected End Date:  11/10/20                     Follow Up Date 09/16/20    - call 211 when I need some help - follow-up on any referrals for help I am given - think ahead to make sure my need does not become an emergency - make a note about what I need to have by the phone or take with me, like an identification card or social security number have a back-up plan - have a back-up plan - make a list of family or friends that I can call    Why is this important?   Knowing how and where to find help for yourself or family in your neighborhood and community is an important skill.  You will want to take some steps to learn how.    Notes:          The patient verbalized understanding of instructions, educational materials, and care plan provided today and declined offer to receive copy of patient instructions, educational materials, and care plan.   Telephone follow up appointment with care management team member scheduled for: 09/17/20  Eduard Clos MSW, LCSW Licensed Clinical Social Worker [place] [phone]

## 2020-09-09 NOTE — Telephone Encounter (Signed)
[facility]

## 2020-09-09 NOTE — Addendum Note (Signed)
Addended by: [name] on: 09/09/2020 03:08 PM   Modules accepted: Orders

## 2020-09-15 NOTE — Telephone Encounter (Signed)
Called and spoke with patient and scheduled f/u for 09/24/20 at 8:00 am.

## 2020-09-16 ENCOUNTER — Telehealth: Payer: Self-pay | Admitting: Family Medicine

## 2020-09-16 NOTE — Telephone Encounter (Signed)
   Telephone encounter was:  Unsuccessful.  09/16/2020 Name: [name] MRN: [id number] DOB: October 04, 1935  Unsuccessful outbound call made today to assist with:  Financial Difficulties related to utility bills.  Outreach Attempt:  1st Attempt  A HIPAA compliant voice message was left requesting a return call.  Instructed patient to call back at [phone].  April Green Care Guide, Embedded Care Coordination [place], Care Management Phone: [phone] Email: [email or web address]

## 2020-09-17 ENCOUNTER — Telehealth: Payer: Medicare HMO

## 2020-09-21 ENCOUNTER — Telehealth: Payer: Self-pay | Admitting: *Deleted

## 2020-09-21 NOTE — Telephone Encounter (Signed)
  Care Management   Follow Up Note   09/21/2020 Name: RICHEL MILLSPAUGH MRN: [id number] DOB: [birth year]   Referred by: [name], MD Reason for referral : No chief complaint on file.   An unsuccessful telephone outreach was attempted today. The patient was referred to the case management team for assistance with care management and care coordination.   Follow Up Plan: The care management team will reach out to the patient again over the next 10 days.   Eduard Clos MSW, LCSW Licensed Clinical Social Worker [place] [phone]

## 2020-09-22 ENCOUNTER — Telehealth: Payer: Medicare HMO

## 2020-09-23 ENCOUNTER — Telehealth: Payer: Self-pay
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
## 2020-09-24 ENCOUNTER — Ambulatory Visit (INDEPENDENT_AMBULATORY_CARE_PROVIDER_SITE_OTHER): Payer: Medicare HMO | Admitting: Family Medicine

## 2020-09-24 ENCOUNTER — Other Ambulatory Visit: Payer: Self-pay

## 2020-09-24 ENCOUNTER — Encounter: Payer: Self-pay | Admitting: Family Medicine

## 2020-09-24 DIAGNOSIS — Z659 Problem related to unspecified psychosocial circumstances: Secondary | ICD-10-CM | POA: Diagnosis not present

## 2020-09-24 DIAGNOSIS — I10 Essential (primary) hypertension: Secondary | ICD-10-CM | POA: Diagnosis not present

## 2020-09-24 DIAGNOSIS — R6889 Other general symptoms and signs: Secondary | ICD-10-CM | POA: Diagnosis not present

## 2020-09-24 MED ORDER — LISINOPRIL 5 MG PO TABS: 5.0000 mg | ORAL_TABLET | Freq: Every day | ORAL | 3 refills | Status: DC
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
## 2020-09-24 NOTE — Patient Instructions (Signed)
Don't change your regular meds but add on lisinopril 5mg  a day.  Update me about your BP in about 10 days.  Take care.  Glad to see you.

## 2020-09-27 DIAGNOSIS — Z659 Problem related to unspecified psychosocial circumstances: Secondary | ICD-10-CM | POA: Insufficient documentation

## 2020-09-27 NOTE — Assessment & Plan Note (Signed)
See above.  Add on lisinopril 5 mg a day and she can update me about her blood pressure about 10 days.  Okay for outpatient follow-up.  She agrees to plan.  Routine ACE cautions given to patient.  33 minutes were devoted to patient care in this encounter (this includes time spent reviewing the patient's file/history, interviewing and examining the patient, counseling/reviewing plan with patient).

## 2020-09-27 NOTE — Assessment & Plan Note (Signed)
See above.  She is check with social work.  She will update me as needed.

## 2020-09-28 ENCOUNTER — Telehealth: Payer: Self-pay

## 2020-09-28 NOTE — Telephone Encounter (Signed)
Noted. Thanks.

## 2020-09-28 NOTE — Telephone Encounter (Signed)
Pt left v/m that pt has not received the new BP med from the pharmacy. On 09/24/20 pt was supposed to continue pts regular meds and was to add on lisinopril 5 mg taking one daily. Pt has not gotten lisinopril 5 mg yet. I spoke with Baxter Flattery at [facility] and they did receive the prescription for lisinopril 5 mg and Baxter Flattery said would send out # 12 of lisinopril 5 mg taking one daily today 09/28/20. Baxter Flattery said they will deliver #90 of lisinopril 5 mg with other packaged medications.I was unable to reach pt, tina pts POA,April pts daughter by phone. I spoke with Caryl Pina pts granddaughter ( DPR signed) and notified her of the above info and Caryl Pina voiced understanding. Caryl Pina was aware pt was to add a new BP med and Caryl Pina will let pt know about the response to her call earlier today. Nothing further needed at this time. Sending as Juluis Rainier to [place] CMA due to pt getting late start on taking the lisinopril 5 mg.

## 2020-09-29 ENCOUNTER — Telehealth: Payer: Self-pay | Admitting: Family Medicine

## 2020-09-29 NOTE — Telephone Encounter (Signed)
   Telephone encounter was:  Unsuccessful.  09/29/2020 Name: MANAAL MANDALA MRN: [id number] DOB: [birth year]  Unsuccessful outbound call made today to assist with:  Financial Difficulties related to paying utilities  Outreach Attempt:  2nd Attempt  A HIPAA compliant voice message was left requesting a return call.  Instructed patient to call back at [phone].  April Green Care Guide, Embedded Care Coordination [place], Care Management Phone: [phone] Email: [email or web address]

## 2020-09-30 ENCOUNTER — Telehealth: Payer: Self-pay | Admitting: Family Medicine

## 2020-09-30 NOTE — Telephone Encounter (Signed)
   Telephone encounter was:  Successful.  09/30/2020 Name: TARRY BLAYNEY MRN: [id number] DOB: 10/04/1935  SHRUTI ARREY is a 85 y.o. year old female who is a primary care patient of [name], MD . The community resource team was consulted for assistance with utilities. Care guide performed the following interventions: Discussed resources to assist with paying her [place]. She is getting some assistance from [facility] but they can't pay all of it. It was originally over $900. Advised pt I will try to find other resources to help.   Follow Up Plan:  Care guide will follow up with patient by phone over the next week.  April Green Care Guide, Embedded Care Coordination [place], Care Management Phone: [phone] Email: [email or web address]

## 2020-10-07 ENCOUNTER — Telehealth: Payer: Self-pay | Admitting: Family Medicine

## 2020-10-07 DIAGNOSIS — R6889 Other general symptoms and signs: Secondary | ICD-10-CM | POA: Diagnosis not present

## 2020-10-07 DIAGNOSIS — H353211 Exudative age-related macular degeneration, right eye, with active choroidal neovascularization: Secondary | ICD-10-CM | POA: Diagnosis not present

## 2020-10-07 NOTE — Telephone Encounter (Signed)
   Telephone encounter was:  Successful.  10/07/2020 Name: [name] MRN: [id number] DOB: 1935/05/06  [name] is a 85 y.o. year old female who is a primary care patient of [name], MD . The community resource team was consulted for assistance with Financial Difficulties related to paying electric bill.  Care guide performed the following interventions: Follow up call placed to the patient to discuss status of referral. Sent request to [facility] to help pay balance of $994.69 due August 2nd to avoid disconnection on August 3rd.   Follow Up Plan:  Care guide will follow up with patient by phone over the next week or as soon as I hear the status of the approval.   April Green Care Guide, Embedded Care Coordination [place], Care Management Phone: [phone] Email: [email or web address]

## 2020-10-08 ENCOUNTER — Telehealth: Payer: Self-pay | Admitting: Family Medicine

## 2020-10-08 NOTE — Telephone Encounter (Signed)
   Telephone encounter was:  Unsuccessful.  10/08/2020 Name: KAIRY BOLAM MRN: [id number] DOB: 13-Oct-1935  Unsuccessful outbound call made today to assist with:  Financial Difficulties related to paying utilities.  Outreach Attempt:  1st Attempt  Voicemail didn't pick up, it just kept ringing. I will try back before 5 pm.   April Green Care Guide, Embedded Care Coordination [place], Care Management Phone: [phone] Email: [email or web address]

## 2020-10-09 ENCOUNTER — Telehealth: Payer: Self-pay | Admitting: Family Medicine

## 2020-10-09 NOTE — Telephone Encounter (Signed)
   Telephone encounter was:  Unsuccessful.  10/09/2020 Name: [name] MRN: [id number] DOB: [birth year]  Unsuccessful outbound call made today to assist with:  Financial Difficulties related to paying utilities.  Outreach Attempt:  2nd Attempt  A HIPAA compliant voice message was left requesting a return call.  Instructed patient to call back at [phone]  April Green Care Guide, Embedded Care Coordination [place], Care Management Phone: [phone] Email: [email or web address]

## 2020-10-12 ENCOUNTER — Telehealth: Payer: Self-pay | Admitting: Family Medicine

## 2020-10-12 NOTE — Telephone Encounter (Signed)
   Telephone encounter was:  Successful.  10/12/2020 Name: [name] MRN: [id number] DOB: [birth year]  [name] is a 85 y.o. year old female who is a primary care patient of [name], MD . The community resource team was consulted for assistance with Financial Difficulties related to paying utilities.   Care guide performed the following interventions: Follow up call placed to the patient to discuss status of referral Patient Granddaughter, Caryl Pina, returned my call. I advised we were able to pay the difference between the total [employer] bill of [phone] and the $600 social service is paying which was $394.69 to avoid the shut off which was scheduled for Aug 3rd if payment wasn't made by Aug. 2nd. She advised Tyniya was not available to speak with me, but has told her and they are very thankful .  Follow Up Plan:  No further follow up planned at this time. The patient has been provided with needed resources.  April Green Care Guide, Embedded Care Coordination [place], Care Management Phone: [phone] Email: [email or web address]

## 2020-10-16 ENCOUNTER — Ambulatory Visit (INDEPENDENT_AMBULATORY_CARE_PROVIDER_SITE_OTHER): Payer: Medicare HMO | Admitting: *Deleted

## 2020-10-16 DIAGNOSIS — E785 Hyperlipidemia, unspecified: Secondary | ICD-10-CM

## 2020-10-16 DIAGNOSIS — I10 Essential (primary) hypertension: Secondary | ICD-10-CM | POA: Diagnosis not present

## 2020-10-16 DIAGNOSIS — Z659 Problem related to unspecified psychosocial circumstances: Secondary | ICD-10-CM
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
## 2020-10-16 NOTE — Patient Instructions (Signed)
Visit Information  PATIENT GOALS:  Goals Addressed             This Visit's Progress    COMPLETED: Find Help in My Community       Timeframe:  Short-Term Goal Priority:  High Start Date: 09/07/20                             Expected End Date:  11/10/20                      Go to bank to ensure safety of bank accounts Call 911 if your money, credit cards or other personal valuables are being taken without your consent. - call 211 when I need some help - follow-up on any referrals for help I am given - think ahead to make sure my need does not become an emergency - make a note about what I need to have by the phone or take with me, like an identification card or social security number have a back-up plan - have a back-up plan - make a list of family or friends that I can call    Why is this important?   Knowing how and where to find help for yourself or family in your neighborhood and community is an important skill.  You will want to take some steps to learn how.    Notes:         The patient verbalized understanding of instructions, educational materials, and care plan provided today and declined offer to receive copy of patient instructions, educational materials, and care plan.   No further follow up required: pt to call PRN  Eduard Clos MSW, LCSW Licensed Clinical Social Worker [place] [phone]

## 2020-10-20 ENCOUNTER — Telehealth: Payer: Self-pay

## 2020-10-20 NOTE — Telephone Encounter (Signed)
Noted.  I would restart lisinopril and go from there.  Please update [place] as needed.  Thanks.

## 2020-10-20 NOTE — Telephone Encounter (Signed)
I spoke with pt; pt did not go anywhere and stayed in bed; pt said this morning she is feeling OK. Pt said she feels like she needs to take the lisinopril. I advised pt she needs to take the lisinopril; pt said only got # 12. Caryl Pina her granddaughter verified pt only got # 12 of lisinopril.Caryl Pina was leaving for dental appt and I spoke back with pt).I called [facility] and spoke with Jaci Standard and he checked and said the # 90 of lisinopril 5 mg did not go out with other meds. Jaci Standard said he did not know why the med was not delivered with pts other meds and he would call pt now and get med out to pt today. Pt voiced understanding and will cb for appt next wk. UC & ED precautions given and pt voiced understanding.Sending note to Dr Damita Dunnings and Janett Billow CMA. Sending additional reminder to [facility] CMA thru Teams.

## 2020-10-20 NOTE — Telephone Encounter (Signed)
LMTCB

## 2020-10-20 NOTE — Telephone Encounter (Signed)
[place] Payne - Client TELEPHONE ADVICE RECORD AccessNurse Patient Name: [name] - [facility] [name] Gender: Female DOB: 1935-07-31 Age: 85 Y 9 M 13 D Return Phone Number: [phone] (Primary) Address: City/ State/ Zip: [place] [address] Client [name] - Client Client Site [place] Physician Renford Dills - MD Contact Type Call Who Is Calling Patient / Member / Family / Caregiver Call Type Triage / Clinical Relationship To Patient Self Return Phone Number [phone] (Primary) Chief Complaint Dizziness Reason for Call Symptomatic / Request for [name] states is with Dr. Josefine Class office - Pt having dizzy spells for the past couple of days and had a real bad one last night while going to the bathroom. Translation No Nurse Assessment Nurse: Jacqueline Cal, RN, Jacqueline Date/Time (Eastern Time): 10/19/2020 4:15:15 PM Confirm and document reason for call. If symptomatic, describe symptoms. ---Caller states is with Dr. Josefine Class office - Pt having dizzy spells for the past couple of days and had a real bad one last night while going to the bathroom. Caller asking to see MD. Reports she has been on a new med . 1 tablet once a Payne for 12 days (Lisinopril '5mg'$ ) . States 12 days was sometime last week. Does the patient have any new or worsening symptoms? ---Yes Will a triage be completed? ---Yes Related visit to physician within the last 2 weeks? ---Yes Does the PT have any chronic conditions? (i.e. diabetes, asthma, this includes High risk factors for pregnancy, etc.) ---Yes List chronic conditions. ---HTN asthma hyperlidema Is this a behavioral health or substance abuse call? ---No Guidelines Guideline Title Affirmed Question Affirmed Notes Nurse Date/Time (Eastern Time) Dizziness - Lightheadedness Patient sounds very sick or weak to the triager Jacqueline Cal,  RN, Jacqueline 10/19/2020 4:18:32 PM Disp. Time [name] Time) Disposition Final User PLEASE NOTE: All timestamps contained within this report are represented as [place] Standard Time. CONFIDENTIALTY NOTICE: This fax transmission is intended only for the addressee. It contains information that is legally privileged, confidential or otherwise protected from use or disclosure. If you are not the intended recipient, you are strictly prohibited from reviewing, disclosing, copying using or disseminating any of this information or taking any action in reliance on or regarding this information. If you have received this fax in error, please notify us immediately by telephone Jacqueline that we can arrange for its return to [place]. Phone: [phone], Toll-Free: [phone], Fax: [phone] Page: 2 of 2 Call Id: [id number] 10/19/2020 4:28:58 PM Go to ED Now (or PCP triage) Yes Jacqueline Cal, RN, Berdie Payne Disagree/Comply Disagree Caller Understands Yes PreDisposition Call Doctor Care Advice Given Per Guideline GO TO ED NOW (OR PCP TRIAGE): * IF NO PCP (PRIMARY CARE PROVIDER) SECOND-LEVEL TRIAGE: You need to be seen within the next hour. Go to the [facility] at _____________ [place] as soon as you can. Comments User: Jacqueline Postin, RN Date/Time (Eastern Time): 10/19/2020 4:20:07 PM 180/70; BP earlier in the week. User: Jacqueline Postin, RN Date/Time [name] Time): 10/19/2020 4:22:34 PM Audible wheezing during the call. Pt reports she takes her breathing medications every 6 hours and it is about time to take it again. User: Jacqueline Postin, RN Date/Time [name] Time): 10/19/2020 4:24:47 PM 186/73 HR 63 User: Jacqueline Postin, RN Date/Time [name] Time): 10/19/2020 4:28:46 PM Patient refused ED. Caller only wishes to make an appt with her PCP. Referrals GO TO FACILITY REFUSED REFERRED TO PCP OFFICE

## 2020-10-21 NOTE — Telephone Encounter (Signed)
[place] [facility] - Client Nonclinical Telephone Record AccessNurse Client [place] [facility] - Client Client Site [place] Physician Renford Dills - MD Contact Type Call Who Is Calling Patient / Member / Family / Caregiver Caller Name [place] Phone Number [phone] Call Type Message Only Information Provided Reason for Call Returning a Call from the Office Initial [place] states she is returning call. Disp. Time Disposition Final User 10/20/2020 5:26:38 PM General Information Provided Yes Sharia Reeve Call Closed By: Sharia Reeve Transaction Date/Time: 10/20/2020 5:24:55 PM (ET)

## 2020-10-21 NOTE — Telephone Encounter (Signed)
Tried to call patient but no answer and machine is full

## 2020-10-26 ENCOUNTER — Ambulatory Visit: Payer: Medicare HMO | Admitting: Family Medicine

## 2020-10-27 ENCOUNTER — Telehealth: Payer: Self-pay

## 2020-10-27 NOTE — Chronic Care Management (AMB) (Addendum)
Chronic Care Management Pharmacy Assistant   Name: [name]  MRN: [id number] DOB: [birth year]  Reason for Encounter: Medication Adherence and Delivery Coordination    Recent office visits:  10/16/20 - PCP - CCM telephone follow up social worker 09/24/20 - Dr.Duncan PCP- Patient presented for follow up hypertension. Add on lisinopril 5 mg a day and she can update me about her blood pressure about 10 days.  Recent consult visits:  None since last CCM contact  Hospital visits:  None in previous 6 months  Medications: Outpatient Encounter Medications as of 10/27/2020  Medication Sig   albuterol (VENTOLIN HFA) 108 (90 Base) MCG/ACT inhaler Inhale 2 puffs by mouth every 6 hours as needed for wheezing/shortness of breath   ALPRAZolam (XANAX) 0.5 MG tablet TAKE 1 TABLET BY MOUTH ONCE DAILY AS NEEDED FOR ANXIETY   apixaban (ELIQUIS) 5 MG TABS tablet Take 1 tablet (5 mg total) by mouth 2 (two) times daily.   atorvastatin (LIPITOR) 80 MG tablet TAKE 1 TABLET BY MOUTH  DAILY   Cholecalciferol (VITAMIN D) 2000 units CAPS Take 2,000 Units by mouth daily.   diltiazem (CARDIZEM CD) 180 MG 24 hr capsule Take 1 capsule (180 mg total) by mouth daily.   fluticasone (FLONASE) 50 MCG/ACT nasal spray Place 2 sprays into both nostrils daily.   fluticasone (FLOVENT HFA) 110 MCG/ACT inhaler Inhale 2 puffs into the lungs in the morning and at bedtime. Rinse after use.   hydrocortisone (ANUSOL-HC) 2.5 % rectal cream Place rectally 2 (two) times daily.   isosorbide mononitrate (IMDUR) 30 MG 24 hr tablet TAKE 1 TABLET BY MOUTH  DAILY   lansoprazole (PREVACID) 30 MG capsule Take 1 capsule by mouth twice daily   lisinopril (ZESTRIL) 5 MG tablet Take 1 tablet (5 mg total) by mouth daily.   metoprolol tartrate (LOPRESSOR) 25 MG tablet Take 1 tablet (25 mg total) by mouth 2 (two) times daily.   sertraline (ZOLOFT) 100 MG tablet Take one and one-half tablets by mouth once daily   vitamin B-12 (CYANOCOBALAMIN)  1000 MCG tablet Take 1,000 mcg by mouth daily.   No facility-administered encounter medications on file as of 10/27/2020.   BP Readings from Last 3 Encounters:  09/24/20 (!) 158/80  08/13/20 128/76  08/06/20 (!) 190/76    Lab Results  Component Value Date   HGBA1C 5.9 10/17/2011     Recent OV, Consult or Hospital visit: 09/24/20 - Dr.Duncan PCP Patient presented for follow up hypertension. Add on lisinopril 5 mg a day and she can update me about her blood pressure about 10 days.  Per patient, PCP added lisinopril to medications and the patient will follow up 10/29/20   Last adherence delivery date:10/07/2020      Patient is due for next adherence delivery on: 11/05/2020  Spoke with patient on 10/28/20 reviewed medications and coordinated delivery.  This delivery to include: Vials  30 Days  Vitamin D3 73mg  take 1 tablet daily breakfast Eliquis '5mg'$  take 1 tablet 2 times daily breakfast and evening meal  Metoprolol '25mg'$  take 1 tablet 2 times daily breakfast and evening meal Alprazolam 0.'5mg'$  take 1 tablet daily for anxiety  Flovent HFA  110 mcg/act  2 puffs in am and 2 puffs at bedtime  Patient declined the following medications this month: Diltiazem '180mg'$  take 1 tablet daily breakfast  (8/17 patient has adequate supply) Imdur '30mg'$  take 1 tablet daily (breakfast) ( 8/17 patient has adequate supply) Vitamin B-12 10044m  take 1 tablet  daily breakfast (8/17 patient has adequate supply) Sertraline '100mg'$  take 1 1/2 tablets daily breakfast and evening meal (8/17 patient has adequate supply) Atorvastatin '80mg'$  take 1 tablet daily (8/17 patient has adequate supply) Lansoprasole '30mg'$  take 1 tablet 2 times daily breakfast evening meal (8/17 patient has adequate supply) Albuterol HFA 180 inhaler  take 2 puffs daily as needed (patient has adequate supply) Lisinopril 5 mg daily (delivered 10/20/20 30 DS)  Any concerns about your medications? No  How often do you forget or accidentally miss a  dose? Never  Do you use a pillbox? Yes The patient reports she got the pillbox out to go over her medications .  Is patient in packaging No  Refills requested from PCP include: Flovent HFA 144mg/act  Confirmed delivery date of 11/05/20, advised patient that pharmacy will contact them the morning of delivery.  Recent blood pressure readings are as follows: takes in am prior to medications.  10/25/20 141/66   63P  10/26/20 137/59   54P  10/27/20 148/64   64P  10/28/20 142/54   54P  MDebbora Dus CPP notified  VAvel Sensor CEast Grand ForksAssistant [phone] I have reviewed the care management and care coordination activities outlined in this encounter and I am certifying that I agree with the content of this note. Scheduled in person visit to verify adherence.  MDebbora Dus PharmD Clinical Pharmacist [phone]

## 2020-10-29 ENCOUNTER — Telehealth: Payer: Self-pay

## 2020-10-29 ENCOUNTER — Ambulatory Visit: Payer: Medicare HMO | Admitting: Family Medicine

## 2020-10-29 NOTE — Telephone Encounter (Signed)
Patient called and left message states that her ride did not show up and she will not be able to make appointment today.

## 2020-10-29 NOTE — Telephone Encounter (Signed)
Patient called back states that she is sorry that she can get ride she did not want to make any more appointments with [name] to only miss them because of her ride. Patient states that after all of that this morning her blood pressure was "VERY HIGH" refused to tell me what reading was just told me she was not going to hospital to pay money she didn't have. I have offered to call transportation and set up an appointment to make sure that her ride will come but she declined. States she may call back Monday to set up appointment. Patient then disconnected the call.   I was able to get number for transportation if she calls back to make appointment we might want to call and verify that they are set to come get her.  [phone]

## 2020-11-01 NOTE — Telephone Encounter (Addendum)
Noted. Thanks.  See below. Please check with patient on Monday re: BP/scheduling/transportation.

## 2020-11-02 NOTE — Telephone Encounter (Signed)
[place] # for transportation is [phone], when patient needs to be picked up after the appointment to call [phone]. Adding this in app notes

## 2020-11-02 NOTE — Telephone Encounter (Signed)
Called patient. Patient is feeling better now. B/P today is 161/52, pulse 50. Patient states her pulse sometimes is in 40s but usually in the 50s. Patient denies any chest pain, shortness of breath, dizziness or any other neurological symptoms. BP last week when she called was 301/70 and pulse was 82.  Explained to patient that it is very important to be evaluated when her b/p readings are that high. I scheduled patient for 11/09/20 at 12 pm. I called transportation and verified with them that patient will be picked up and taking to see Dr Damita Dunnings on that day. Patient aware, patient was advised to make sure and be near her phone that morning when they call to confirm with the patient also.

## 2020-11-03 NOTE — Telephone Encounter (Signed)
Noted. Thanks.

## 2020-11-04 ENCOUNTER — Other Ambulatory Visit: Payer: Self-pay | Admitting: Family Medicine

## 2020-11-04 ENCOUNTER — Telehealth: Payer: Self-pay | Admitting: Family Medicine

## 2020-11-04 MED ORDER — METOPROLOL TARTRATE 25 MG PO TABS: 25.0000 mg | ORAL_TABLET | Freq: Two times a day (BID) | ORAL | 1 refills | Status: DC

## 2020-11-04 NOTE — Progress Notes (Signed)
Patient requested refill on Flovent. RX at [facility] is expired. Requested refill be sent in to [facility] by Dr. Josefine Class clinic if appropriate.    [name], CPP notified  [name], [place] Pharmacy Assistant [phone]

## 2020-11-04 NOTE — Telephone Encounter (Signed)
-----   Message from Filbert Berthold, [place] sent at 11/03/2020 10:26 AM EDT ----- Regarding: FW: Refill  ----- Message ----- From: Susa Simmonds Sent: 11/03/2020  10:09 AM EDT To: Filbert Berthold, CMA Subject: Refill                                         Inquiring about patients metoprolol succ 25 mg. I see in the chart it says ended but patient states she is still taking it. If she is in fact still on this medication will you please send refill to [facility]?  Thank you,   [name], [place] Pharmacy Assistant [phone]

## 2020-11-04 NOTE — Telephone Encounter (Signed)
Sent. Thanks.   

## 2020-11-05 ENCOUNTER — Telehealth: Payer: Self-pay

## 2020-11-05 DIAGNOSIS — R6889 Other general symptoms and signs: Secondary | ICD-10-CM | POA: Diagnosis not present

## 2020-11-05 MED ORDER — FLUTICASONE PROPIONATE HFA 110 MCG/ACT IN AERO: 2.0000 | INHALATION_SPRAY | Freq: Two times a day (BID) | RESPIRATORY_TRACT | 2 refills | Status: DC

## 2020-11-05 NOTE — Telephone Encounter (Signed)
-----   Message from [place] sent at 11/04/2020  1:35 PM EDT ----- Regarding: Refill Patient is requesting refill on Flovent. Rx on file at [facility] is expired. If appropriate will you please send refill to [facility]?   Thank you  [name], [place] Pharmacy Assistant [phone]

## 2020-11-06 MED ORDER — FLUTICASONE PROPIONATE HFA 110 MCG/ACT IN AERO: 2.0000 | INHALATION_SPRAY | Freq: Two times a day (BID) | RESPIRATORY_TRACT | 3 refills | Status: DC

## 2020-11-06 NOTE — Telephone Encounter (Signed)
Sent. Thanks.   

## 2020-11-06 NOTE — Addendum Note (Signed)
Addended by: [name] on: 11/06/2020 03:53 PM   Modules accepted: Orders

## 2020-11-09 ENCOUNTER — Encounter: Payer: Self-pay | Admitting: Family Medicine

## 2020-11-09 ENCOUNTER — Other Ambulatory Visit: Payer: Self-pay

## 2020-11-09 ENCOUNTER — Ambulatory Visit (INDEPENDENT_AMBULATORY_CARE_PROVIDER_SITE_OTHER): Payer: Medicare HMO | Admitting: Family Medicine

## 2020-11-09 VITALS — BP 122/64 | HR 62 | Temp 97.2°F | Ht 65.0 in | Wt 213.0 lb

## 2020-11-09 DIAGNOSIS — I10 Essential (primary) hypertension: Secondary | ICD-10-CM

## 2020-11-09 DIAGNOSIS — Z659 Problem related to unspecified psychosocial circumstances: Secondary | ICD-10-CM

## 2020-11-09 DIAGNOSIS — R6889 Other general symptoms and signs: Secondary | ICD-10-CM | POA: Diagnosis not present

## 2020-11-09 LAB — COMPREHENSIVE METABOLIC PANEL
ALT: 12 U/L (ref 0–35)
AST: 17 U/L (ref 0–37)
Albumin: 3.8 g/dL (ref 3.5–5.2)
Alkaline Phosphatase: 85 U/L (ref 39–117)
BUN: 17 mg/dL (ref 6–23)
CO2: 29 mEq/L (ref 19–32)
Calcium: 9.2 mg/dL (ref 8.4–10.5)
Chloride: 102 mEq/L (ref 96–112)
Creatinine, Ser: 0.87 mg/dL (ref 0.40–1.20)
GFR: 61.86 mL/min (ref >60.00)
Glucose, Bld: 92 mg/dL (ref 70–99)
Potassium: 4.7 mEq/L (ref 3.5–5.1)
Sodium: 138 mEq/L (ref 135–145)
Total Bilirubin: 0.5 mg/dL (ref 0.2–1.2)
Total Protein: 6.8 g/dL (ref 6.0–8.3)

## 2020-11-09 LAB — LIPID PANEL
Cholesterol: 180 mg/dL (ref 0–200)
HDL: 52.3 mg/dL (ref >39.00)
LDL Cholesterol: 112 mg/dL — ABNORMAL HIGH (ref 0–99)
NonHDL: 127.4
Total CHOL/HDL Ratio: 3
Triglycerides: 78 mg/dL (ref 0.0–149.0)
VLDL: 15.6 mg/dL (ref 0.0–40.0)

## 2020-11-09 LAB — CBC WITH DIFFERENTIAL/PLATELET
Basophils Absolute: 0 10*3/uL (ref 0.0–0.1)
Basophils Relative: 0.3 % (ref 0.0–3.0)
Eosinophils Absolute: 0.1 10*3/uL (ref 0.0–0.7)
Eosinophils Relative: 0.8 % (ref 0.0–5.0)
HCT: 36.8 % (ref 36.0–46.0)
Hemoglobin: 12 g/dL (ref 12.0–15.0)
Lymphocytes Relative: 31.1 % (ref 12.0–46.0)
Lymphs Abs: 2.3 10*3/uL (ref 0.7–4.0)
MCHC: 32.6 g/dL (ref 30.0–36.0)
MCV: 84.7 fl (ref 78.0–100.0)
Monocytes Absolute: 0.6 10*3/uL (ref 0.1–1.0)
Monocytes Relative: 7.5 % (ref 3.0–12.0)
Neutro Abs: 4.5 10*3/uL (ref 1.4–7.7)
Neutrophils Relative %: 60.3 % (ref 43.0–77.0)
Platelets: 204 10*3/uL (ref 150.0–400.0)
RBC: 4.34 Mil/uL (ref 3.87–5.11)
RDW: 15.3 % (ref 11.5–15.5)
WBC: 7.4 10*3/uL (ref 4.0–10.5)

## 2020-11-09 LAB — TSH: TSH: 1.62 u[IU]/mL (ref 0.35–5.50)

## 2020-11-09 MED ORDER — METOPROLOL TARTRATE 25 MG PO TABS: ORAL_TABLET | ORAL | Status: DC

## 2020-11-09 NOTE — Progress Notes (Signed)
This visit occurred during the SARS-CoV-2 public health emergency.  Safety protocols were in place, including screening questions prior to the visit, additional usage of staff PPE, and extensive cleaning of exam room while observing appropriate contact time as indicated for disinfecting solutions.  Per patient, granddaughter "has been ordering a lot of stuff with boxes coming to the house."  Per patient, it is in patient's name.  Patient is worried about getting the bills paid.  Granddaughter is still living with patient.  D/w pt about options.  She is considering talking to the bank about previous withdrawals.  Per patient social service is involved.   Hypertension:    Episodic BP elevation and lower readings noted.  Yesterday she was sitting in the kitchen and went to ground.  Was able to get up.  She thought she had slid out of the chair, to the ground.  No CP.  No SOB.  No BLE edema.  H/o similar in the spring with prev cardiology eval done.  No neuro sx, no weakness.  Didn't feel heart racing yesterday or recently.  Not lightheaded walking into clinic today.    Meds, vitals, and allergies reviewed.   ROS: Per HPI unless specifically indicated in ROS section   GEN: nad, alert and oriented HEENT:ncat NECK: supple w/o LA CV: rrr.   PULM: ctab, no inc wob ABD: soft, +bs EXT: no edema SKIN: Well-perfused. EKG discussed with patient, with sinus bradycardia noted.  35 minutes were devoted to patient care in this encounter (this includes time spent reviewing the patient's file/history, interviewing and examining the patient, counseling/reviewing plan with patient).

## 2020-11-09 NOTE — Patient Instructions (Signed)
Stop the metoprolol for now and we'll update cardiology.  And we'll update you about your labs.   Take care.  Glad to see you.

## 2020-11-10 ENCOUNTER — Telehealth: Payer: Self-pay | Admitting: Family Medicine

## 2020-11-10 NOTE — Telephone Encounter (Signed)
Patient called in with medication concerns . And would like a call back

## 2020-11-11 ENCOUNTER — Other Ambulatory Visit: Payer: Self-pay

## 2020-11-11 DIAGNOSIS — H43813 Vitreous degeneration, bilateral: Secondary | ICD-10-CM | POA: Diagnosis not present

## 2020-11-11 DIAGNOSIS — H353122 Nonexudative age-related macular degeneration, left eye, intermediate dry stage: Secondary | ICD-10-CM | POA: Diagnosis not present

## 2020-11-11 DIAGNOSIS — H353211 Exudative age-related macular degeneration, right eye, with active choroidal neovascularization: Secondary | ICD-10-CM | POA: Diagnosis not present

## 2020-11-11 DIAGNOSIS — H43391 Other vitreous opacities, right eye: Secondary | ICD-10-CM | POA: Diagnosis not present

## 2020-11-11 MED ORDER — LANSOPRAZOLE 30 MG PO CPDR: 30.0000 mg | DELAYED_RELEASE_CAPSULE | Freq: Two times a day (BID) | ORAL | 11 refills | Status: DC

## 2020-11-11 MED ORDER — ALBUTEROL SULFATE HFA 108 (90 BASE) MCG/ACT IN AERS: INHALATION_SPRAY | RESPIRATORY_TRACT | 11 refills | Status: DC

## 2020-11-11 NOTE — Assessment & Plan Note (Signed)
She said some episodic blood pressure elevations but the concern is for relative hypotension/bradycardia.  EKG reviewed with patient at office visit.  Recheck basic labs today.  See notes on labs.  In the meantime.  Stop metoprolol and I will ask for cardiology input.  No chest pain, not lightheaded, and still okay for outpatient follow-up.  She agrees to plan.

## 2020-11-11 NOTE — Telephone Encounter (Signed)
Called patient back to see what medication concerns she had and patient does not have any concerns with her meds. Patient needs refills on her ventolin inhaler and lansoprazole. I have sent these refills [facility].

## 2020-11-11 NOTE — Assessment & Plan Note (Signed)
She is considering options, especially with her granddaughter.  Support offered.  She will update me as needed.

## 2020-11-20 DIAGNOSIS — I350 Nonrheumatic aortic (valve) stenosis: Secondary | ICD-10-CM | POA: Diagnosis not present

## 2020-11-20 DIAGNOSIS — I10 Essential (primary) hypertension: Secondary | ICD-10-CM | POA: Diagnosis not present

## 2020-11-20 DIAGNOSIS — I480 Paroxysmal atrial fibrillation: Secondary | ICD-10-CM | POA: Diagnosis not present

## 2020-11-20 DIAGNOSIS — I259 Chronic ischemic heart disease, unspecified: Secondary | ICD-10-CM | POA: Diagnosis not present

## 2020-11-24 ENCOUNTER — Telehealth: Payer: Self-pay

## 2020-11-24 NOTE — Chronic Care Management (AMB) (Addendum)
    Chronic Care Management Pharmacy Assistant   Name: [name]  MRN: [id number] DOB: May 05, 1935    Reason for Encounter: Reminder Call   Conditions to be addressed/monitored: CAD, HTN, and HLD   Medications: Outpatient Encounter Medications as of 11/24/2020  Medication Sig   albuterol (VENTOLIN HFA) 108 (90 Base) MCG/ACT inhaler Inhale 2 puffs by mouth every 6 hours as needed for wheezing/shortness of breath   ALPRAZolam (XANAX) 0.5 MG tablet TAKE 1 TABLET BY MOUTH ONCE DAILY AS NEEDED FOR ANXIETY   apixaban (ELIQUIS) 5 MG TABS tablet Take 1 tablet (5 mg total) by mouth 2 (two) times daily.   atorvastatin (LIPITOR) 80 MG tablet TAKE 1 TABLET BY MOUTH  DAILY   Cholecalciferol (VITAMIN D) 2000 units CAPS Take 2,000 Units by mouth daily.   diltiazem (CARDIZEM CD) 180 MG 24 hr capsule Take 1 capsule (180 mg total) by mouth daily.   fluticasone (FLONASE) 50 MCG/ACT nasal spray Place 2 sprays into both nostrils daily.   fluticasone (FLOVENT HFA) 110 MCG/ACT inhaler Inhale 2 puffs into the lungs in the morning and at bedtime. Rinse after use   isosorbide mononitrate (IMDUR) 30 MG 24 hr tablet TAKE 1 TABLET BY MOUTH  DAILY   lansoprazole (PREVACID) 30 MG capsule Take 1 capsule (30 mg total) by mouth 2 (two) times daily.   lisinopril (ZESTRIL) 5 MG tablet Take 1 tablet (5 mg total) by mouth daily.   metoprolol tartrate (LOPRESSOR) 25 MG tablet Held as of 11/09/20   sertraline (ZOLOFT) 100 MG tablet Take one and one-half tablets by mouth once daily   vitamin B-12 (CYANOCOBALAMIN) 1000 MCG tablet Take 1,000 mcg by mouth daily.   No facility-administered encounter medications on file as of 11/24/2020.   Voicemail left for [name] to remind her of her upcoming telephone visit with [name] on 12/01/2020 at 1:00pm. Patient was reminded to have all medications, supplements and any blood glucose and blood pressure readings available for review at appointment.   Star Rating  Drugs: Medication:  Last Fill: Day Supply Atorvastatin '80mg'$  08/06/20 30 Lisinopril '5mg'$  10/20/20  30  [name], CPP notified  Avel Sensor, [place] Assistant [phone]  I have reviewed the care management and care coordination activities outlined in this encounter and I am certifying that I agree with the content of this note. No further action required.  [name], PharmD Clinical Pharmacist [facility] at [facility] [phone]

## 2020-11-26 ENCOUNTER — Telehealth: Payer: Self-pay

## 2020-11-26 NOTE — Chronic Care Management (AMB) (Addendum)
Chronic Care Management Pharmacy Assistant   Name: KESLEIGH SVATEK  MRN: [id number] DOB: [birth year]  Reason for Encounter: Medication Adherence and Delivery Coordination   Recent office visits:  11/09/20 - PCP- Patient presented for blood pressure - hypotension/bradycardia. Labs ordered. Hold metoprolol. Follow up with cardiology.  Recent consult visits:  None since last CCM contact  Hospital visits:  None in previous 6 months  Medications: Outpatient Encounter Medications as of 11/26/2020  Medication Sig   albuterol (VENTOLIN HFA) 108 (90 Base) MCG/ACT inhaler Inhale 2 puffs by mouth every 6 hours as needed for wheezing/shortness of breath   ALPRAZolam (XANAX) 0.5 MG tablet TAKE 1 TABLET BY MOUTH ONCE DAILY AS NEEDED FOR ANXIETY   apixaban (ELIQUIS) 5 MG TABS tablet Take 1 tablet (5 mg total) by mouth 2 (two) times daily.   atorvastatin (LIPITOR) 80 MG tablet TAKE 1 TABLET BY MOUTH  DAILY   Cholecalciferol (VITAMIN D) 2000 units CAPS Take 2,000 Units by mouth daily.   diltiazem (CARDIZEM CD) 180 MG 24 hr capsule Take 1 capsule (180 mg total) by mouth daily.   fluticasone (FLONASE) 50 MCG/ACT nasal spray Place 2 sprays into both nostrils daily.   fluticasone (FLOVENT HFA) 110 MCG/ACT inhaler Inhale 2 puffs into the lungs in the morning and at bedtime. Rinse after use   isosorbide mononitrate (IMDUR) 30 MG 24 hr tablet TAKE 1 TABLET BY MOUTH  DAILY   lansoprazole (PREVACID) 30 MG capsule Take 1 capsule (30 mg total) by mouth 2 (two) times daily.   lisinopril (ZESTRIL) 5 MG tablet Take 1 tablet (5 mg total) by mouth daily.   metoprolol tartrate (LOPRESSOR) 25 MG tablet Held as of 11/09/20   sertraline (ZOLOFT) 100 MG tablet Take one and one-half tablets by mouth once daily   vitamin B-12 (CYANOCOBALAMIN) 1000 MCG tablet Take 1,000 mcg by mouth daily.   No facility-administered encounter medications on file as of 11/26/2020.    BP Readings from Last 3 Encounters:  11/09/20 122/64   09/24/20 (!) 158/80  08/13/20 128/76    Lab Results  Component Value Date   HGBA1C 5.9 10/17/2011     Last adherence delivery date:11/05/20      Patient is due for next adherence delivery on: 12/07/20  Spoke with patient on 11/30/20 reviewed medications and coordinated delivery.  This delivery to include: Packs 30 DS Packs: OTC Vitamin D3 25 mcg - 1 breakfast Diltiazem CD 180 mg - 1 breakfast Atorvastatin 80 mg - 1 evening meal Isosorbide Mononitrate ER 30 mg -  1 evening meal Lansoprazole 30 mg - 1 breakfast, 1 evening meal OTC Vitamin B12 1000 mcg - 1 evening meal Lisinopril 5 mg - 1 breakfast Sertraline '100mg'$  -  take 1 and 1/2 tablets breakfast  Vials: Alprazolam 0.'5mg'$  take 1 tablet daily for anxiety  Fluticasone propionate 110/mcg 2 puffs in the morning and bedtime Albuterol HFA 180 inhaler 2 puffs daily as needed  Patient declined the following medications this month: Eliquis 5 mg - she has ~100 on hand Metoprolol - on hold per PCP  Any concerns about your medications? No  How often do you forget or accidentally miss a dose? Never  Do you use a pillbox? Yes  Is patient in packaging No - This will be first packs.   Refills requested from providers include: Eliquis   Confirmed delivery date of 12/07/20, advised patient that pharmacy will contact them the morning of delivery.  Recent blood pressure readings are as follows:  11/30/20 - 185/77  57-P  patient states she had lots of water yesterday, on Saturday she states lots of sweet tea.slight headache this morning.The patient states her BP otherwise has been lower. 11/29/20 - 139/62  62-P 11/28/20 - 142/67  57-P  Annual wellness visit in last year? No - last completed 2018 Most Recent BP reading: 122/64 HR 62 11/09/20  [name], CPP notified  Avel Sensor, [place] Assistant [phone]  I have reviewed the care management and care coordination activities outlined in this encounter and I  am certifying that I agree with the content of this note. No further action required.  [name], PharmD Clinical Pharmacist [facility] at [facility] [phone]

## 2020-12-01 ENCOUNTER — Ambulatory Visit (INDEPENDENT_AMBULATORY_CARE_PROVIDER_SITE_OTHER): Payer: Medicare HMO

## 2020-12-01 ENCOUNTER — Telehealth: Payer: Self-pay

## 2020-12-01 ENCOUNTER — Other Ambulatory Visit: Payer: Self-pay

## 2020-12-01 DIAGNOSIS — E785 Hyperlipidemia, unspecified: Secondary | ICD-10-CM

## 2020-12-01 DIAGNOSIS — I10 Essential (primary) hypertension: Secondary | ICD-10-CM

## 2020-12-01 NOTE — Progress Notes (Signed)
Chronic Care Management Pharmacy Note  12/01/2020 Name:  [name] MRN:  [id number] DOB:  [birth year]  Summary: Patient not taking meds as prescribed. Out of several medications and past due for refills on others.  Recommendations: Switch from vials to adherence packaging this Friday delivery  Plan: Follow up with 30 day adherence review  Subjective: [name] is an 85 y.o. year old female who is a primary patient of Damita Dunnings, Elveria Rising, MD.  The CCM team was consulted for assistance with disease management and care coordination needs.    Engaged with patient by telephone for follow up visit in response to provider referral for pharmacy case management and/or care coordination services.   Consent to Services:  The patient was given information about Chronic Care Management services, agreed to services, and gave verbal consent prior to initiation of services.  Please see initial visit note for detailed documentation.   Patient Care Team: [name], MD as PCP - General (Family Medicine) Ubaldo Glassing Javier Docker, MD as Consulting Physician (Cardiology) Birder Robson, MD as Referring Physician (Ophthalmology) [name], [facility] as Pharmacist (Pharmacist)  Recent office visits: 11/09/20 - PCP - Episodic hypotension/bradycardia, stop metoprolol for now. Consult cardiology.   Recent consult visits:  11/20/20 - Cardiology - Bring a log of BP readings to next visit. No medication changes today.  Hospital visits: 07/29/20 - ED to hospital - Diverticulosis  04/23/20 - ED - Vision disturbance  Objective:  Lab Results  Component Value Date   CREATININE 0.87 11/09/2020   BUN 17 11/09/2020   GFR 61.86 11/09/2020   GFRNONAA >60 07/30/2020   GFRAA >60 11/21/2019   NA 138 11/09/2020   K 4.7 11/09/2020   CALCIUM 9.2 11/09/2020   CO2 29 11/09/2020   GLUCOSE 92 11/09/2020    Lab Results  Component Value Date/Time   HGBA1C 5.9 10/17/2011 11:51 AM   GFR 61.86 11/09/2020 01:00  PM   GFR 86.86 12/03/2019 10:30 AM   MICROALBUR 0.3 06/22/2009 08:35 AM   MICROALBUR 1.2 09/18/2008 09:48 AM     Lab Results  Component Value Date   CHOL 180 11/09/2020   HDL 52.30 11/09/2020   LDLCALC 112 (H) 11/09/2020   LDLDIRECT 94.7 01/30/2014   TRIG 78.0 11/09/2020   CHOLHDL 3 11/09/2020    Hepatic Function Latest Ref Rng & Units 11/09/2020 04/23/2020 12/03/2019  Total Protein 6.0 - 8.3 g/dL 6.8 6.2(L) 6.1  Albumin 3.5 - 5.2 g/dL 3.8 3.1(L) 3.1(L)  AST 0 - 37 U/L '17 20 14  ' ALT 0 - 35 U/L '12 12 10  ' Alk Phosphatase 39 - 117 U/L 85 71 69  Total Bilirubin 0.2 - 1.2 mg/dL 0.5 0.6 0.6  Bilirubin, Direct 0.0 - 0.2 mg/dL - - -    Lab Results  Component Value Date/Time   TSH 1.62 11/09/2020 01:00 PM   TSH 1.54 09/06/2018 11:10 AM    CBC Latest Ref Rng & Units 11/09/2020 07/31/2020 07/30/2020  WBC 4.0 - 10.5 K/uL 7.4 - 7.0  Hemoglobin 12.0 - 15.0 g/dL 12.0 11.3(L) 11.7(L)  Hematocrit 36.0 - 46.0 % 36.8 - 36.0  Platelets 150.0 - 400.0 K/uL 204.0 - 178    Lab Results  Component Value Date/Time   VD25OH 45.28 09/06/2018 11:10 AM   VD25OH 42.92 09/01/2016 12:47 PM    Clinical ASCVD: Yes  The ASCVD Risk score (Arnett DK, et al., 2019) failed to calculate for the following reasons:   The 2019 ASCVD risk score is only  valid for ages 69 to 1    Depression screen PHQ 2/9 09/24/2020 09/08/2016 03/20/2012  Decreased Interest 0 1 0  Down, Depressed, Hopeless 0 3 0  PHQ - 2 Score 0 4 0  Altered sleeping 0 0 -  Tired, decreased energy 0 3 -  Change in appetite 0 1 -  Feeling bad or failure about yourself  0 0 -  Trouble concentrating 0 0 -  Moving slowly or fidgety/restless 0 0 -  Suicidal thoughts 0 0 -  PHQ-9 Score 0 8 -  Difficult doing work/chores - Not difficult at all -  Some recent data might be hidden     Social History   Tobacco Use  Smoking Status Never  Smokeless Tobacco Never   BP Readings from Last 3 Encounters:  11/09/20 122/64  09/24/20 (!) 158/80   08/13/20 128/76   Pulse Readings from Last 3 Encounters:  11/09/20 62  09/24/20 62  08/13/20 64   Wt Readings from Last 3 Encounters:  11/09/20 213 lb (96.6 kg)  09/24/20 218 lb (98.9 kg)  08/13/20 216 lb (98 kg)   BMI Readings from Last 3 Encounters:  11/09/20 35.45 kg/m  09/24/20 36.28 kg/m  08/13/20 35.94 kg/m    Assessment/Interventions: Review of patient past medical history, allergies, medications, health status, including review of consultants reports, laboratory and other test data, was performed as part of comprehensive evaluation and provision of chronic care management services.   SDOH:  (Social Determinants of Health) assessments and interventions performed: Yes     CCM Care Plan  Allergies  Allergen Reactions   Amoxicillin Swelling    Face hands   Penicillins Swelling and Other (See Comments)    Has patient had a PCN reaction causing immediate rash, facial/tongue/throat swelling, SOB or lightheadedness with hypotension: Yes Has patient had a PCN reaction causing severe rash involving mucus membranes or skin necrosis: No Has patient had a PCN reaction that required hospitalization No phalosporin use.   Valium [Diazepam] Other (See Comments)    Pt had shakes   Imdur [Isosorbide Nitrate]     Headache at 47m dose    Medications Reviewed Today     Reviewed by DTonia Ghent MD (Physician) on 11/09/20 at 147 Med List Status: <None>   Medication Order Taking? Sig Documenting Provider Last Dose Status Informant  albuterol (VENTOLIN HFA) 108 (90 Base) MCG/ACT inhaler [id number] Inhale 2 puffs by mouth every 6 hours as needed for wheezing/shortness of breath DTonia Ghent MD Taking Active   ALPRAZolam (Duanne Moron 0.5 MG tablet [id number] TAKE 1 TABLET BY MOUTH ONCE DAILY AS NEEDED FOR ANXIETY DTonia Ghent MD Taking Active   apixaban (ELIQUIS) 5 MG TABS tablet [id number] Take 1 tablet (5 mg total) by mouth 2 (two) times daily. DTonia Ghent MD Taking Active   atorvastatin (LIPITOR) 80 MG tablet [id number] TAKE 1 TABLET BY MOUTH  DAILY DTonia Ghent MD Taking Active   Cholecalciferol (VITAMIN D) 2000 units CAPS [id number] Take 2,000 Units by mouth daily. [provider] Taking Active Family Member  diltiazem (CARDIZEM CD) 180 MG 24 hr capsule [id number] Take 1 capsule (180 mg total) by mouth daily. DTonia Ghent MD Taking Active   fluticasone ([facility] 50 MCG/ACT nasal spray [id number] Place 2 sprays into both nostrils daily. DTonia Ghent MD Taking Active Family Member  fluticasone ([facility] 110 MCG/ACT inhaler [id number] Inhale 2 puffs into the lungs  in the morning and at bedtime. Rinse after use [name], MD Taking Active   isosorbide mononitrate (IMDUR) 30 MG 24 hr tablet [id number] Yes TAKE 1 TABLET BY MOUTH  DAILY [name], MD Taking Active   lansoprazole (PREVACID) 30 MG capsule [id number] Yes Take 1 capsule by mouth twice daily [name], MD Taking Active   lisinopril (ZESTRIL) 5 MG tablet [id number] Yes Take 1 tablet (5 mg total) by mouth daily. [name], MD Taking Active   metoprolol tartrate (LOPRESSOR) 25 MG tablet [id number] Yes Take 1 tablet (25 mg total) by mouth 2 (two) times daily. [name], MD Taking Active   sertraline (ZOLOFT) 100 MG tablet [id number] Yes Take one and one-half tablets by mouth once daily [name], MD Taking Active   vitamin B-12 (CYANOCOBALAMIN) 1000 MCG tablet [id number] Yes Take 1,000 mcg by mouth daily. [provider] Taking Active Family Member            Patient Active Problem List   Diagnosis Date Noted   Other social stressor 09/27/2020   Rectal bleeding    Cecal polyp    Lipoma of colon    Polyp of ascending colon    Polyp of sigmoid colon    Diverticulosis    GI bleed 07/29/2020   Vision changes 04/26/2020   Foot callus 12/30/2019   Urinary frequency 04/14/2019   Gout 08/21/2017    Vision loss 07/30/2017   Headache 07/30/2017   Syncope 12/08/2016   Atrial fibrillation ([place]) 12/05/2016   Advance care planning 09/09/2016   Fatigue 01/20/2016   Arthritis 04/13/2015   Rhinitis 02/25/2015   Osteoarthritis of both knees 11/13/2014   Primary osteoarthritis of both knees 11/13/2014   Cough 11/13/2013   UTI (urinary tract infection) 11/21/2012   Flank pain 11/21/2012   GERD (gastroesophageal reflux disease)    Back pain 05/04/2011   URI (upper respiratory infection) 10/27/2010   Hand pain 10/27/2010   Osteoporosis 08/23/2010   UNSPECIFIED VITAMIN D DEFICIENCY 09/23/2008   DEPRESSION/ANXIETY 09/23/2008   Obesity (BMI 30.0-34.9) 06/21/2006   CORONARY ARTERY DISEASE, S/P PTCA 06/21/2006   Extrinsic asthma 06/21/2006   Essential hypertension 06/13/1999   Hyperlipidemia 03/15/1999    Immunization History  Administered Date(s) Administered   Fluad Quad(high Dose 65+) 03/18/2019, 12/26/2019   Influenza Split 12/23/2011   Influenza,inj,Quad PF,6+ Mos 01/30/2014, 04/09/2015, 04/08/2016, 12/05/2016   Pneumococcal Conjugate-13 11/13/2014   Pneumococcal Polysaccharide-23 10/05/2009   Td 09/23/2008   Unspecified SARS-COV-2 Vaccination 05/13/2019, 06/03/2019    Conditions to be addressed/monitored:  Hypertension and Hyperlipidemia  Care Plan : [place]  Updates made by [name], [place] since 12/01/2020 12:00 AM     Problem: CHL AMB "PATIENT-SPECIFIC PROBLEM"      Long-Range Goal: Disease Management   Start Date: 09/07/2020  This Visit's Progress: Not on track  Priority: High  Note:    Current Barriers:  Poor medication adherence  Pharmacist Clinical Goal(s):  Patient will contact provider office for questions/concerns as evidenced notation of same in electronic health record through collaboration with PharmD and provider.   Interventions: 1:1 collaboration with [name], MD regarding development and update of comprehensive plan of  care as evidenced by provider attestation and co-signature Inter-disciplinary care team collaboration (see longitudinal plan of care) Comprehensive medication review performed; medication list updated in electronic medical record  Hypertension/CAD (BP goal <140/90) Query Controlled - not adherent to medications  -Current treatment: Diltiazem 180 mg -  1 capsule daily (pt is out, not taking) Isosorbide Mononitrate 30 mg - 1 tablet daily  (affirms adherence) Lisinopril 5 mg - 1 tablet daily (started 09/24/20, pt is out, not taking) -Medications previously tried: none  -Current home readings: using arm cuff at home 11/29/20 139/62  HR 62 11/28/20 142/67  HR 57 -Feeling better off metoprolol -Counseled to monitor BP at home weekly, document, and provide log at future appointments -Recommended to continue current medication; Stressed importance of adherence. START pill packaging this Friday.  Hyperlipidemia: (LDL goal < 70) -Uncontrolled - LDL 112 (11/09/20) not taking statin as prescribed  -Current treatment: Atorvastatin 80 mg - 1 tablet daily  -Medications previously tried: none  -She confirms daily adherence and denies missed doses. -Recommended to continue current medication; She has a full bottle on hand, but denies missed doses. She will call when low so we can re-sync.  Patient Goals/Self-Care Activities Patient will:  - take medications as prescribed - start pill packs this Friday for all meds except Eliquis (finish out current supply of 53 days) and Xanax (PRN in vial) -check blood pressure at home, document, and provide at future appointments  Follow Up Plan: The care management team will reach out to the patient again over the next 30 days -  CMA will call for BP log/adherence review     Medication Assistance: None required.  Patient affirms current coverage meets needs.  Patient's preferred pharmacy is:  [facility] - [place], [place] - [address] [place] [address] Phone: [phone] Fax: [phone]  [facility]  ([place]) - [place] [place] [address] Phone: [phone] Fax: [phone]  Uses pill box? Yes Pt endorses 90% compliance - May miss her evening medications 1-2 times per month  Care Plan and Follow Up Patient Decision:  Patient agrees to Care Plan and Follow-up.  [name], PharmD Clinical Pharmacist [place] [facility] at [facility] [phone]

## 2020-12-01 NOTE — Progress Notes (Addendum)
Chronic Care Management Pharmacy Assistant   Name: [name]  MRN: [id number] DOB: [birth year]   Reason for Encounter: Medication Adherence   Recent office visits:  None since last CCM contact  Recent consult visits:  None since last CCM contact  Hospital visits:  None since last CCM contact  Medications: Outpatient Encounter Medications as of 12/01/2020  Medication Sig   albuterol (VENTOLIN HFA) 108 (90 Base) MCG/ACT inhaler Inhale 2 puffs by mouth every 6 hours as needed for wheezing/shortness of breath   ALPRAZolam (XANAX) 0.5 MG tablet TAKE 1 TABLET BY MOUTH ONCE DAILY AS NEEDED FOR ANXIETY   apixaban (ELIQUIS) 5 MG TABS tablet Take 1 tablet (5 mg total) by mouth 2 (two) times daily.   atorvastatin (LIPITOR) 80 MG tablet TAKE 1 TABLET BY MOUTH  DAILY   Cholecalciferol (VITAMIN D) 2000 units CAPS Take 2,000 Units by mouth daily.   diltiazem (CARDIZEM CD) 180 MG 24 hr capsule Take 1 capsule (180 mg total) by mouth daily.   fluticasone (FLONASE) 50 MCG/ACT nasal spray Place 2 sprays into both nostrils daily.   fluticasone (FLOVENT HFA) 110 MCG/ACT inhaler Inhale 2 puffs into the lungs in the morning and at bedtime. Rinse after use   isosorbide mononitrate (IMDUR) 30 MG 24 hr tablet TAKE 1 TABLET BY MOUTH  DAILY   lansoprazole (PREVACID) 30 MG capsule Take 1 capsule (30 mg total) by mouth 2 (two) times daily.   lisinopril (ZESTRIL) 5 MG tablet Take 1 tablet (5 mg total) by mouth daily.   metoprolol tartrate (LOPRESSOR) 25 MG tablet Held as of 11/09/20   sertraline (ZOLOFT) 100 MG tablet Take one and one-half tablets by mouth once daily   vitamin B-12 (CYANOCOBALAMIN) 1000 MCG tablet Take 1,000 mcg by mouth daily.   No facility-administered encounter medications on file as of 12/01/2020.   BP Readings from Last 3 Encounters:  11/09/20 122/64  09/24/20 (!) 158/80  08/13/20 128/76    Lab Results  Component Value Date   HGBA1C 5.9 10/17/2011    Last adherence delivery  date: 11/05/2020      Patient is due for next adherence delivery on: 12/07/2020  Spoke with patient on 12/01/2020 and reviewed medications. Patient went through each medication vial and counted her medications for me. I also spoke with patient's granddaughter who is requesting patient receive all her medications from this point forward in packaging.   Current VIAL medications: Vitamin D3 16mcg  take 1 tablet daily breakfast - 24 remaining Eliquis 5mg  take 1 tablet 2 times daily breakfast and evening meal - 106 remaining Fluticasone propionate 110/mcg 2 puffs in the morning and bedtime - 1 has 61 units and 1 is full remaining Lisinopril 5mg   take 1 tablet daily - PT has not been taking and does not have any Sertraline 100mg   take 1 1/2 tablets daily breakfast, evening meal - 10 remaining Flovent HFA  110 mcg/act  2 puffs in am and 2 puffs at bedtime - PT states she does not have any remaining  Diltiazem 180mg  24 hr capsule 1 capsule by mouth daily breakfast - Empty bottle/none remaining Atorvastatin 80mg  Tablet 1 tablet by mouth daily breakfast- Empty bottle/none remaining Vitamin B-12 1040mcg Take 1,000 by mouth daily breakfast - 10 remaining Isosorbide Mononitrate 30mg  24 hr tablet Take 1 tablet by mouth daily breakfast - 35 remaining  PRN Medication: Alprazolam 0.5mg  take 1 tablet daily for anxiety - 7 remaining - pt states she has been taking two  a day; not the 1 a day prescribed.  Albuterol HFA 180 inhaler 2 puffs daily as needed - Pt opened a new one today; last one she has remaining   Discontinued Medication: Metoprolol 25mg  take 1 tablet 2 times daily breakfast and evening meal (11/09/20 PCP stopped) - No longer taking as of 11/09/2020  Any concerns about your medications? No  How often do you forget or accidentally miss a dose? Never  Do you use a pillbox? Yes  Is patient in packaging No  No refill request needed.  Annual wellness visit in last year? No - Last one in  2018 Most Recent BP reading: 122/64 on 11/09/2020  [name], CPP notified  [name], [place] Assistant [phone]   I have reviewed the care management and care coordination activities outlined in this encounter and I am certifying that I agree with the content of this note. See phone visit.  [name], PharmD Clinical Pharmacist [facility] at [facility] [phone]

## 2020-12-01 NOTE — Patient Instructions (Signed)
Dear [name],  Below is a summary of the goals we discussed during our follow up appointment on December 01, 2020. Please contact me anytime with questions or concerns.   Visit Information  Long-Range Goal: Disease Management   Start Date: 09/07/2020  This Visit's Progress: Not on track  Priority: High  Note:    Current Barriers:  Poor medication adherence  Pharmacist Clinical Goal(s):  Patient will contact provider office for questions/concerns as evidenced notation of same in electronic health record through collaboration with PharmD and provider.   Interventions: 1:1 collaboration with [name], MD regarding development and update of comprehensive plan of care as evidenced by provider attestation and co-signature Inter-disciplinary care team collaboration (see longitudinal plan of care) Comprehensive medication review performed; medication list updated in electronic medical record  Hypertension/CAD (BP goal <140/90) Query Controlled - not adherent to medications  -Current treatment: Diltiazem 180 mg - 1 capsule daily (pt is out, not taking) Isosorbide Mononitrate 30 mg - 1 tablet daily  (affirms adherence) Lisinopril 5 mg - 1 tablet daily (started 09/24/20, pt is out, not taking) -Medications previously tried: none  -Current home readings: using arm cuff at home 11/29/20 139/62  HR 62 11/28/20 142/67  HR 57 -Feeling better off metoprolol -Counseled to monitor BP at home weekly, document, and provide log at future appointments -Recommended to continue current medication; Stressed importance of adherence. START pill packaging this Friday.  Hyperlipidemia: (LDL goal < 70) -Uncontrolled - LDL 112 (11/09/20) not taking statin as prescribed  -Current treatment: Atorvastatin 80 mg - 1 tablet daily  -Medications previously tried: none  -She confirms daily adherence and denies missed doses. -Recommended to continue current medication; She has a full bottle on hand, but  denies missed doses. She will call when low so we can re-sync.  Patient Goals/Self-Care Activities Patient will:  - take medications as prescribed - start pill packs this Friday for all meds except Eliquis (finish out current supply of 53 days) and Xanax (PRN in vial) -check blood pressure at home, document, and provide at future appointments  Follow Up Plan: The care management team will reach out to the patient again over the next 30 days -  CMA will call for BP log/adherence review       The patient verbalized understanding of instructions, educational materials, and care plan provided today and declined offer to receive copy of patient instructions, educational materials, and care plan.   [name], PharmD Clinical Pharmacist [facility] at [facility] [phone]

## 2020-12-08 ENCOUNTER — Telehealth: Payer: Self-pay | Admitting: Family Medicine

## 2020-12-08 MED ORDER — ISOSORBIDE MONONITRATE ER 30 MG PO TB24: ORAL_TABLET | ORAL | Status: DC

## 2020-12-08 NOTE — Telephone Encounter (Signed)
Pt called stating she took her BP today at 3:30 and it was 100/42. Please advise.

## 2020-12-08 NOTE — Addendum Note (Signed)
Addended by: [name] on: 12/08/2020 05:08 PM   Modules accepted: Orders

## 2020-12-08 NOTE — Telephone Encounter (Signed)
Spoke to patient by telephone and was advised that she is feeling better today. Patient stated that she took her medication this morning then went back to sleep. Patient checked her blood pressure while on the phone and it was 143/69 heart rate 68. Patient stated that her breathing is a lot better. Patient denies chest pain, SOB or difficulty breathing. Patient stated that she was concerned because her blood pressure numbers have been too low at times she thinks. Patient stated that she thinks drinking sweat tea may have caused her blood pressure to drop. Patient stated that she is drinking lots of water and doing only sugar free drinks. Patient stated that it seems like all she wants to do is sleep.Patient was advised to continue to monitor her blood pressure. Patient was given ER precautions and she verbalized understanding.

## 2020-12-08 NOTE — Telephone Encounter (Signed)
I wouldn't change her meds based on her current BP but if she has persistent lower BPs (<120 SBP), then let me know.  Thanks.

## 2020-12-08 NOTE — Telephone Encounter (Signed)
I would stop imdur in the meantime.  If she has CP or persistent SBP <120, then let me know.  It may take a few days for her BP to increase off imdur.  Thanks.

## 2020-12-08 NOTE — Telephone Encounter (Signed)
Patient has been advised to stop Imdur and keep check on BP. Advised to call back if she gets any CP or SBP <120.

## 2020-12-11 DIAGNOSIS — I10 Essential (primary) hypertension: Secondary | ICD-10-CM

## 2020-12-11 DIAGNOSIS — E785 Hyperlipidemia, unspecified: Secondary | ICD-10-CM | POA: Diagnosis not present

## 2020-12-16 DIAGNOSIS — R6889 Other general symptoms and signs: Secondary | ICD-10-CM | POA: Diagnosis not present

## 2020-12-16 DIAGNOSIS — H353211 Exudative age-related macular degeneration, right eye, with active choroidal neovascularization: Secondary | ICD-10-CM | POA: Diagnosis not present

## 2020-12-16 DIAGNOSIS — H353122 Nonexudative age-related macular degeneration, left eye, intermediate dry stage: Secondary | ICD-10-CM | POA: Diagnosis not present

## 2020-12-18 ENCOUNTER — Other Ambulatory Visit: Payer: Self-pay | Admitting: Family Medicine

## 2020-12-21 ENCOUNTER — Other Ambulatory Visit: Payer: Self-pay | Admitting: Family Medicine

## 2020-12-21 ENCOUNTER — Telehealth: Payer: Self-pay

## 2020-12-21 NOTE — Progress Notes (Addendum)
/ /  Chronic Care Management  - Pharmacy Assistant   Name: [name]  MRN: [id number] DOB: [birth year]  Reason for Encounter: Medication Adherence and Delivery Coordination   Recent office visits:  12/08/2020 - [name], MD - Telephone - Patient called in due to low blood pressure (100/42). Hold: isosorbide mononitrate (IMDUR) 30 MG 24 hr tablet.  Recent consult visits:  None since last CCM contact  Hospital visits:  None since last CCM contact  Medications: Outpatient Encounter Medications as of 12/21/2020  Medication Sig   albuterol (VENTOLIN HFA) 108 (90 Base) MCG/ACT inhaler Inhale 2 puffs by mouth every 6 hours as needed for wheezing/shortness of breath   ALPRAZolam (XANAX) 0.5 MG tablet TAKE 1 TABLET BY MOUTH ONCE DAILY AS NEEDED FOR ANXIETY   apixaban (ELIQUIS) 5 MG TABS tablet Take 1 tablet (5 mg total) by mouth 2 (two) times daily.   atorvastatin (LIPITOR) 80 MG tablet TAKE 1 TABLET BY MOUTH  DAILY (Patient not taking: Reported on 12/01/2020)   Cholecalciferol (VITAMIN D) 2000 units CAPS Take 2,000 Units by mouth daily.   diltiazem (CARDIZEM CD) 180 MG 24 hr capsule Take 1 capsule (180 mg total) by mouth daily. (Patient not taking: Reported on 12/01/2020)   fluticasone (FLONASE) 50 MCG/ACT nasal spray Place 2 sprays into both nostrils daily.   fluticasone (FLOVENT HFA) 110 MCG/ACT inhaler Inhale 2 puffs into the lungs in the morning and at bedtime. Rinse after use   isosorbide mononitrate (IMDUR) 30 MG 24 hr tablet Held as of 12/08/20   lansoprazole (PREVACID) 30 MG capsule Take 1 capsule (30 mg total) by mouth 2 (two) times daily.   lisinopril (ZESTRIL) 5 MG tablet Take 1 tablet (5 mg total) by mouth daily. (Patient not taking: Reported on 12/01/2020)   metoprolol tartrate (LOPRESSOR) 25 MG tablet Held as of 11/09/20 (Patient not taking: Reported on 12/01/2020)   sertraline (ZOLOFT) 100 MG tablet Take one and one-half tablets by mouth once daily   vitamin B-12  (CYANOCOBALAMIN) 1000 MCG tablet Take 1,000 mcg by mouth daily.   No facility-administered encounter medications on file as of 12/21/2020.   BP Readings from Last 3 Encounters:  11/09/20 122/64  09/24/20 (!) 158/80  08/13/20 128/76    Lab Results  Component Value Date   HGBA1C 5.9 10/17/2011      No OVs, Consults, or hospital visits since last care coordination call / Pharmacist visit. Recent medication changes indicated:  Hold: isosorbide mononitrate (IMDUR) 30 MG 24 hr tablet.   Last adherence delivery date: 12/07/2020  Patient is due for next adherence delivery on: 12/31/2020  Multiple attempts made to reach patient. Unsuccessful outreach. Will refill based off of last adherence fill.   This delivery to include: Adherence Packaging  30 Days   Packs, 30 DS: Diltiazem 180mg  24 hr capsule 1 capsule by mouth daily breakfast  Atorvastatin 80mg  Tablet 1 tablet by mouth daily breakfast Lansoprazole 30mg  Take 1 tablet at breakfast and 1 tablet evening meal Vitamin B-12 1051mcg Take 1,000 by mouth daily breakfast Vitamin D3 72mcg  take 1 tablet daily breakfast Sertraline 100mg   take 1 1/2 tablets daily breakfast, evening meal  Lisinopril 5mg   take 1 tablet daily Eliquis 5mg  take 1 tablet 2 times daily breakfast and evening meal  Vials: Albuterol HFA 180 inhaler 2 puffs daily as needed  Flovent HFA  110 mcg/act  2 puffs in am and 2 puffs at bedtime Alprazolam 0.5mg  take 1 tablet daily for anxiety (PRN) Fluticasone  propionate 110/mcg 2 puffs in the morning and bedtime  Medications not sent: Isosorbide Mononitrate 30mg  24 hr tablet Take 1 tablet by mouth daily breakfast  (ON HOLD - 12/08/20)  Delivery scheduled for 12/31/2020. Unable to speak with patient to confirm date.   Annual wellness visit in last year? No Last one in 2018 Most Recent BP reading: 122/64 on 11/09/2020  [name], CPP notified  [name], [place] Assistant [phone]  I have  reviewed the care management and care coordination activities outlined in this encounter and I am certifying that I agree with the content of this note. Reviewed chart, will removed isosorbide from packs this month. Add Eliquis in packs. Follow up call in 1-2 weeks for adherence and BP logs.  [name], PharmD Clinical Pharmacist [facility] at [facility] [phone]

## 2020-12-23 NOTE — Telephone Encounter (Signed)
Refill request for Alprazolam 0.5 mg tablets  LOV - 11/09/20 Next OV - not scheduled Last refill - 09/02/20 #30/2

## 2021-01-04 ENCOUNTER — Telehealth: Payer: Self-pay

## 2021-01-04 NOTE — Progress Notes (Addendum)
Chronic Care Management Pharmacy Assistant   Name: [name]  MRN: [id number] DOB: 1936/01/27  Reason for Encounter: CCM (Hypertension Disease State)    Recent office visits:  12/08/2020 - [name], MD - Telephone - Patient called in due to low blood pressure (100/42). Hold: isosorbide mononitrate (IMDUR) 30 MG 24 hr tablet.  Recent consult visits:  None since last CCM contact  Hospital visits:  None since last CCM contact  Medications: Outpatient Encounter Medications as of 01/04/2021  Medication Sig   albuterol (VENTOLIN HFA) 108 (90 Base) MCG/ACT inhaler Inhale 2 puffs by mouth every 6 hours as needed for wheezing/shortness of breath   ALPRAZolam (XANAX) 0.5 MG tablet TAKE ONE TABLET BY MOUTH daily AS NEEDED FOR ANXIETY   atorvastatin (LIPITOR) 80 MG tablet TAKE 1 TABLET BY MOUTH  DAILY (Patient not taking: Reported on 12/01/2020)   Cholecalciferol (VITAMIN D) 2000 units CAPS Take 2,000 Units by mouth daily.   diltiazem (CARDIZEM CD) 180 MG 24 hr capsule Take 1 capsule (180 mg total) by mouth daily. (Patient not taking: Reported on 12/01/2020)   ELIQUIS 5 MG TABS tablet TAKE ONE TABLET BY MOUTH TWICE DAILY   fluticasone (FLONASE) 50 MCG/ACT nasal spray Place 2 sprays into both nostrils daily.   fluticasone (FLOVENT HFA) 110 MCG/ACT inhaler Inhale 2 puffs into the lungs in the morning and at bedtime. Rinse after use   isosorbide mononitrate (IMDUR) 30 MG 24 hr tablet Held as of 12/08/20   lansoprazole (PREVACID) 30 MG capsule Take 1 capsule (30 mg total) by mouth 2 (two) times daily.   lisinopril (ZESTRIL) 5 MG tablet Take 1 tablet (5 mg total) by mouth daily. (Patient not taking: Reported on 12/01/2020)   metoprolol tartrate (LOPRESSOR) 25 MG tablet Held as of 11/09/20 (Patient not taking: Reported on 12/01/2020)   sertraline (ZOLOFT) 100 MG tablet Take one and one-half tablets by mouth once daily   vitamin B-12 (CYANOCOBALAMIN) 1000 MCG tablet Take 1,000 mcg by mouth  daily.   No facility-administered encounter medications on file as of 01/04/2021.   Recent Office Vitals: BP Readings from Last 3 Encounters:  11/09/20 122/64  09/24/20 (!) 158/80  08/13/20 128/76   Pulse Readings from Last 3 Encounters:  11/09/20 62  09/24/20 62  08/13/20 64    Wt Readings from Last 3 Encounters:  11/09/20 213 lb (96.6 kg)  09/24/20 218 lb (98.9 kg)  08/13/20 216 lb (98 kg)     Kidney Function Lab Results  Component Value Date/Time   CREATININE 0.87 11/09/2020 01:00 PM   CREATININE 0.74 07/30/2020 04:42 AM   CREATININE 1.00 02/06/2014 10:47 PM   CREATININE 0.99 02/04/2014 04:05 PM   CREATININE 0.82 03/20/2012 10:56 AM   CREATININE 0.92 02/06/2012 03:11 PM   GFR 61.86 11/09/2020 01:00 PM   GFRNONAA >60 07/30/2020 04:42 AM   GFRNONAA 57 (L) 02/06/2014 10:47 PM   GFRNONAA 71 03/20/2012 10:56 AM   GFRAA >60 11/21/2019 04:41 AM   GFRAA >60 02/06/2014 10:47 PM   GFRAA 82 03/20/2012 10:56 AM    BMP Latest Ref Rng & Units 11/09/2020 07/30/2020 07/29/2020  Glucose 70 - 99 mg/dL 92 96 106(H)  BUN 6 - 23 mg/dL 17 10 13   Creatinine 0.40 - 1.20 mg/dL 0.87 0.74 0.85  Sodium 135 - 145 mEq/L 138 140 139  Potassium 3.5 - 5.1 mEq/L 4.7 3.5 3.5  Chloride 96 - 112 mEq/L 102 109 107  CO2 19 - 32 mEq/L 29 23 22  Calcium 8.4 - 10.5 mg/dL 9.2 8.2(L) 8.9   Attempted contact with patient 3 times on 10/24, 10/25 and 10/26. Unsuccessful outreach. Will attempt contact next month.   Current antihypertensive regimen:  Isosorbide Mononitrate 30 mg - 1 tablet daily on hold per [name] on 12/08/2020   Adherence Review: Is the patient currently on ACE/ARB medication? Yes Does the patient have >5 day gap between last estimated fill dates? No  Star Rating Drugs:  Medication:  Last Fill: Day Supply Lisinopril 5mg   12/25/2020 30 Atorvastatin 80mg  12/25/2020 30  Verified fill dates with [facility].  Care Gaps: Annual wellness visit in last year? No Most Recent BP reading:  122/64 on 11/09/2020  No appointments scheduled within the next 30 days.  [name], CPP notified  [name], [place] Clinical Pharmacy Assistant [phone]  I have reviewed the care management and care coordination activities outlined in this encounter and I am certifying that I agree with the content of this note. No further action required.  [name], PharmD Clinical Pharmacist [place] [facility] at [facility] [phone]

## 2021-01-14 ENCOUNTER — Telehealth: Payer: Self-pay

## 2021-01-14 NOTE — Progress Notes (Addendum)
Chronic Care Management Pharmacy Assistant   Name: [name]  MRN: [id number] DOB: 09/06/1935  Reason for Encounter: CCM (Hypertension Disease State)   Recent office visits:  None since last CCM contact  Recent consult visits:  None since last CCM contact  Hospital visits:  None since last CCM contact  Medications: Outpatient Encounter Medications as of 01/14/2021  Medication Sig   albuterol (VENTOLIN HFA) 108 (90 Base) MCG/ACT inhaler Inhale 2 puffs by mouth every 6 hours as needed for wheezing/shortness of breath   ALPRAZolam (XANAX) 0.5 MG tablet TAKE ONE TABLET BY MOUTH daily AS NEEDED FOR ANXIETY   atorvastatin (LIPITOR) 80 MG tablet TAKE 1 TABLET BY MOUTH  DAILY (Patient not taking: Reported on 12/01/2020)   Cholecalciferol (VITAMIN D) 2000 units CAPS Take 2,000 Units by mouth daily.   diltiazem (CARDIZEM CD) 180 MG 24 hr capsule Take 1 capsule (180 mg total) by mouth daily. (Patient not taking: Reported on 12/01/2020)   ELIQUIS 5 MG TABS tablet TAKE ONE TABLET BY MOUTH TWICE DAILY   fluticasone (FLONASE) 50 MCG/ACT nasal spray Place 2 sprays into both nostrils daily.   fluticasone (FLOVENT HFA) 110 MCG/ACT inhaler Inhale 2 puffs into the lungs in the morning and at bedtime. Rinse after use   isosorbide mononitrate (IMDUR) 30 MG 24 hr tablet Held as of 12/08/20   lansoprazole (PREVACID) 30 MG capsule Take 1 capsule (30 mg total) by mouth 2 (two) times daily.   lisinopril (ZESTRIL) 5 MG tablet Take 1 tablet (5 mg total) by mouth daily. (Patient not taking: Reported on 12/01/2020)   metoprolol tartrate (LOPRESSOR) 25 MG tablet Held as of 11/09/20 (Patient not taking: Reported on 12/01/2020)   sertraline (ZOLOFT) 100 MG tablet Take one and one-half tablets by mouth once daily   vitamin B-12 (CYANOCOBALAMIN) 1000 MCG tablet Take 1,000 mcg by mouth daily.   No facility-administered encounter medications on file as of 01/14/2021.   Recent Office Vitals: BP Readings from Last  3 Encounters:  11/09/20 122/64  09/24/20 (!) 158/80  08/13/20 128/76   Pulse Readings from Last 3 Encounters:  11/09/20 62  09/24/20 62  08/13/20 64    Wt Readings from Last 3 Encounters:  11/09/20 213 lb (96.6 kg)  09/24/20 218 lb (98.9 kg)  08/13/20 216 lb (98 kg)    Kidney Function Lab Results  Component Value Date/Time   CREATININE 0.87 11/09/2020 01:00 PM   CREATININE 0.74 07/30/2020 04:42 AM   CREATININE 1.00 02/06/2014 10:47 PM   CREATININE 0.99 02/04/2014 04:05 PM   CREATININE 0.82 03/20/2012 10:56 AM   CREATININE 0.92 02/06/2012 03:11 PM   GFR 61.86 11/09/2020 01:00 PM   GFRNONAA >60 07/30/2020 04:42 AM   GFRNONAA 57 (L) 02/06/2014 10:47 PM   GFRNONAA 71 03/20/2012 10:56 AM   GFRAA >60 11/21/2019 04:41 AM   GFRAA >60 02/06/2014 10:47 PM   GFRAA 82 03/20/2012 10:56 AM   BMP Latest Ref Rng & Units 11/09/2020 07/30/2020 07/29/2020  Glucose 70 - 99 mg/dL 92 96 106(H)  BUN 6 - 23 mg/dL 17 10 13   Creatinine 0.40 - 1.20 mg/dL 0.87 0.74 0.85  Sodium 135 - 145 mEq/L 138 140 139  Potassium 3.5 - 5.1 mEq/L 4.7 3.5 3.5  Chloride 96 - 112 mEq/L 102 109 107  CO2 19 - 32 mEq/L 29 23 22   Calcium 8.4 - 10.5 mg/dL 9.2 8.2(L) 8.9   Contacted patient on 01/14/2021 to discuss hypertension disease state  Current antihypertensive regimen:  Isosorbide Mononitrate 30 mg - 1 tablet daily on hold per Dr. Damita Payne on 12/08/2020  Patient verbally confirms she is not taking the above medications as directed. She has been holding on the above medication   How often are you checking your Blood Pressure? daily  she checks her blood pressure in the morning before taking her medication.  Current home BP readings:  Patient did not know the dates, but below are the readings she gave me: 149/54 139/71 151/64 124/57 136/66 140/65 124/57  Wrist or arm cuff: Arm Caffeine intake: Drinks tea (2 cups a day), but usually orange juice (1 gallon a week) and water.  Salt intake: She does not use  salt.  OTC medications including pseudoephedrine or NSAIDs? Tylenol 1-2 week   Any readings above 180/120? No  What recent interventions/DTPs have been made by any provider to improve Blood Pressure control since last CPP Visit: Hold Isosorbide Mononitrate 30 mg per Dr. Damita Payne  Any recent hospitalizations or ED visits since last visit with CPP? No  What diet changes have been made to improve Blood Pressure Control?  Does not add salt to food. Tries to drink 2-3 cups of water a day.   What exercise is being done to improve your Blood Pressure Control?  Patient walks around the house or will go outside and work in the yard for an hour. She raked the gravel in the yard the other day.   Spoke with patient; she states she has not been feeling well. She is tired all the time. She can not do anything without having to lay down. She can not even do dishes anymore. Patient will go to bed around 7:30pm and wake up around 6:00am. Her stomach is also been hurting really bad when she has a bowel movement. She has pain below her belly button after she is finished with her bowel movement. She will go 2-3 days without a bowel movement, but will urinate 2 - 3 times a day. She is not taking anything for the pain. This has been happening for a few months, but is getting worse. She stated she knows she needs to see Dr. Damita Payne, but does not want to go in the office and answer the questions the nurses will ask her. She states she doesn't want to be asked any questions. Patient also states she is not going to get any more medication filled or take any more medication. She has to pay at the pharmacy and she can not afford it. She said she used to get it all for free, but now she does not. Patient states a man came to her house on Monday (she did not know who he was) and told her that someone went in the computer and changed her information so now she has to pay for medication. She stated she asked him to sign her up for  [employer] and he would not. Patient stated he had her fill out paperwork and she signed some papers, but she doesn't know what for. She stated he left a sheet with her of everything she needs to pay. Patient stated the eye doctor won't see her anymore because she has a past due balance, but the heart doctor will continue seeing her even though she owes them money. Patient stated her lights are about to be shut off because her Granddaughter has said she can't/won't pay for the water bill or light bill anymore. Granddaughter lives with her, but won't pay the two bills she  is supposed to pay. Patient said she was talking to April about assistance in August or September, but didn't hear anything back.   Adherence Review: Is the patient currently on ACE/ARB medication? Yes Does the patient have >5 day gap between last estimated fill dates? No  Star Rating Drugs:  Medication:  Last Fill: Day Supply Lisinopril 5mg               12/25/2020      30 Atorvastatin 80mg        12/25/2020      30         Verified fill dates with [facility].  Care Gaps: Annual wellness visit in last year? No Most Recent BP reading: 122/64 on 11/09/2020   No appointments scheduled within the next 30 days.  [name], CPP notified  [name], [place] Clinical Pharmacy Assistant [phone]  Patient's medications range from $1-4 per 30 day supply. The max is $10 for Eliquis and Flonase. There are no other options for her medication cost other than to switch back to mail order. Patient had a very difficult time with mail order in the past. I will see if any of her med costs can be lowered.  Patient has been referred to SW but no longer seen. Will see if SW can assist with current situation.  [name], PharmD Clinical Pharmacist [facility] at [facility] [phone]

## 2021-01-15 NOTE — Telephone Encounter (Signed)
Pt returning call. Pt would like a call back at [phone].

## 2021-01-15 NOTE — Telephone Encounter (Signed)
Janett Billow- please see about getting patient an OV.  Marcie Bal- please follow up with patient to see what assistance you can provide.  I thank all involved.

## 2021-01-15 NOTE — Telephone Encounter (Signed)
Called patient but she was not home; LMTCB.

## 2021-01-15 NOTE — Telephone Encounter (Signed)
Thank you Marcie Bal, do I need patient permission to report to APS?   Also, Checked with pharmacy and unfortunately medication prices cannot be lowered. Will discuss this with patient.

## 2021-01-18 NOTE — Telephone Encounter (Signed)
Sched pt. For 11/14

## 2021-01-18 NOTE — Telephone Encounter (Signed)
Attempted to report to APS, was redirected to [facility] phone # [phone] due to patient address. Will have to call back later today.

## 2021-01-18 NOTE — Telephone Encounter (Signed)
LMTCB; if patient calls back please try to make her an appt. [name] would like to do f/u appt with her.

## 2021-01-19 ENCOUNTER — Telehealth: Payer: Self-pay

## 2021-01-19 NOTE — Progress Notes (Addendum)
Chronic Care Management Pharmacy Assistant   Name: [name]  MRN: [id number] DOB: 1935/06/06  Reason for Encounter: CCM (Medication Adherence and Delivery Coordination)   Recent office visits:  None since last CCM contact  Recent consult visits:  None since last CCM contact  Hospital visits:  None since last CCM contact  Medications: Outpatient Encounter Medications as of 01/19/2021  Medication Sig   albuterol (VENTOLIN HFA) 108 (90 Base) MCG/ACT inhaler Inhale 2 puffs by mouth every 6 hours as needed for wheezing/shortness of breath   ALPRAZolam (XANAX) 0.5 MG tablet TAKE ONE TABLET BY MOUTH daily AS NEEDED FOR ANXIETY   atorvastatin (LIPITOR) 80 MG tablet TAKE 1 TABLET BY MOUTH  DAILY (Patient not taking: Reported on 12/01/2020)   Cholecalciferol (VITAMIN D) 2000 units CAPS Take 2,000 Units by mouth daily.   diltiazem (CARDIZEM CD) 180 MG 24 hr capsule Take 1 capsule (180 mg total) by mouth daily. (Patient not taking: Reported on 12/01/2020)   ELIQUIS 5 MG TABS tablet TAKE ONE TABLET BY MOUTH TWICE DAILY   fluticasone (FLONASE) 50 MCG/ACT nasal spray Place 2 sprays into both nostrils daily.   fluticasone (FLOVENT HFA) 110 MCG/ACT inhaler Inhale 2 puffs into the lungs in the morning and at bedtime. Rinse after use   isosorbide mononitrate (IMDUR) 30 MG 24 hr tablet Held as of 12/08/20   lansoprazole (PREVACID) 30 MG capsule Take 1 capsule (30 mg total) by mouth 2 (two) times daily.   lisinopril (ZESTRIL) 5 MG tablet Take 1 tablet (5 mg total) by mouth daily. (Patient not taking: Reported on 12/01/2020)   metoprolol tartrate (LOPRESSOR) 25 MG tablet Held as of 11/09/20 (Patient not taking: Reported on 12/01/2020)   sertraline (ZOLOFT) 100 MG tablet Take one and one-half tablets by mouth once daily   vitamin B-12 (CYANOCOBALAMIN) 1000 MCG tablet Take 1,000 mcg by mouth daily.   No facility-administered encounter medications on file as of 01/19/2021.   BP Readings from Last 3  Encounters:  11/09/20 122/64  09/24/20 (!) 158/80  08/13/20 128/76    Lab Results  Component Value Date   HGBA1C 5.9 10/17/2011     No OVs, Consults, or hospital visits since last care coordination call / Pharmacist visit. No medication changes indicated  Last adherence delivery date:12/31/2020      Patient is due for next adherence delivery on: 01/29/2021  Spoke with patient on 01/19/2021 and reviewed medications. Patient denied the need for any medications at this time.  Patient refused all medications including: Adherence Packaging  30 Days  Packs: Eliquis 5mg  take 1 tablet 2 times daily breakfast and evening meal Diltiazem 180mg  24 hr capsule 1 capsule by mouth daily breakfast  Atorvastatin 80mg  Tablet 1 tablet by mouth daily breakfast Lansoprazole 30mg  Take 1 tablet at breakfast and 1 tablet evening meal Vitamin B-12 1031mcg Take 1,000 by mouth daily breakfast Vitamin D3 69mcg  take 1 tablet daily breakfast Sertraline 100mg   take 1 1/2 tablets daily breakfast, evening meal  Lisinopril 5mg   take 1 tablet daily   VIAL medications: Alprazolam 0.5mg  take 1 tablet daily for anxiety (PRN) Fluticasone propionate 110/mcg 2 puffs in the morning and bedtime Albuterol HFA 180 inhaler 2 puffs daily as needed  Flovent HFA  110 mcg/act  2 puffs in am and 2 puffs at bedtime   Medications not due: Isosorbide Mononitrate 30mg  24 hr tablet Take 1 tablet by mouth daily breakfast  (ON HOLD - 12/08/20)  Any concerns about your medications?  Yes - Patient states she cannot afford her medications anymore. She is thinking about cancelling her appointment with [name] on Monday. She doesn't want to pay for it. Doesn't want to see [name] as she feels like if she doesn't come back she won't owe any money. Patient is going to stop taking all medication and stop seeing all doctors.  When the Reita Cliche wants to take her, He will take her. She is tired of the bills pilling up. She refused delivery of  her medications.   Annual wellness visit in last year? No last one in 2018 Most Recent BP reading: 122/64 on 11/09/2020  [name], CPP notified  [name], [place] Assistant [phone]  I have reviewed the care management and care coordination activities outlined in this encounter and I am certifying that I agree with the content of this note. See telephone note 01/19/21 and earlier this week. Working with PCP and SW for assistance. Patient medications have been affordable in the past, cost has not changed.  [name], PharmD Clinical Pharmacist [facility] at [facility] [phone]

## 2021-01-19 NOTE — Telephone Encounter (Signed)
APS report to [facility] completed due to concerns about patient finances.

## 2021-01-20 DIAGNOSIS — H353122 Nonexudative age-related macular degeneration, left eye, intermediate dry stage: Secondary | ICD-10-CM | POA: Diagnosis not present

## 2021-01-20 DIAGNOSIS — R6889 Other general symptoms and signs: Secondary | ICD-10-CM | POA: Diagnosis not present

## 2021-01-20 DIAGNOSIS — H353211 Exudative age-related macular degeneration, right eye, with active choroidal neovascularization: Secondary | ICD-10-CM | POA: Diagnosis not present

## 2021-01-20 DIAGNOSIS — H43391 Other vitreous opacities, right eye: Secondary | ICD-10-CM | POA: Diagnosis not present

## 2021-01-20 DIAGNOSIS — H3540 Unspecified peripheral retinal degeneration: Secondary | ICD-10-CM | POA: Diagnosis not present

## 2021-01-21 ENCOUNTER — Telehealth: Payer: Self-pay | Admitting: *Deleted

## 2021-01-21 NOTE — Chronic Care Management (AMB) (Signed)
  Care Management   Note  01/21/2021 Name: [name] MRN: [id number] DOB: [birth year]  [name] is a 85 y.o. year old female who is a primary care patient of [name], MD and is actively engaged with the care management team. I reached out to [name] by phone today to assist with scheduling an initial visit with the Licensed Clinical Social Worker  Follow up plan: Telephone appointment with care management team member scheduled for:01/22/21  [place] Management  Direct Dial: [phone]

## 2021-01-22 ENCOUNTER — Ambulatory Visit (INDEPENDENT_AMBULATORY_CARE_PROVIDER_SITE_OTHER): Payer: Medicare HMO | Admitting: *Deleted

## 2021-01-22 DIAGNOSIS — Z659 Problem related to unspecified psychosocial circumstances: Secondary | ICD-10-CM

## 2021-01-22 DIAGNOSIS — I259 Chronic ischemic heart disease, unspecified: Secondary | ICD-10-CM

## 2021-01-22 DIAGNOSIS — I4891 Unspecified atrial fibrillation: Secondary | ICD-10-CM

## 2021-01-22 NOTE — Chronic Care Management (AMB) (Signed)
Chronic Care Management    Clinical Social Work Note  01/22/2021 Name: [name] MRN: [id number] DOB: [birth year]  [name] is a 85 y.o. year old female who is a primary care patient of [name], MD. The CCM team was consulted to assist the patient with chronic disease management and/or care coordination needs related to: Financial Difficulties related to concerns related to mis-management of  pt's monies .   Engaged with patient by telephone for initial visit in response to provider referral for social work chronic care management and care coordination services.   Consent to Services:  The patient was given information about Chronic Care Management services, agreed to services, and gave verbal consent prior to initiation of services.  Please see initial visit note for detailed documentation.   Patient agreed to services and consent obtained.   Assessment: Review of patient past medical history, allergies, medications, and health status, including review of relevant consultants reports was performed today as part of a comprehensive evaluation and provision of chronic care management and care coordination services.     SDOH (Social Determinants of Health) assessments and interventions performed:  SDOH Interventions    Flowsheet Row Most Recent Value  SDOH Interventions   Financial Strain Interventions Other (Comment)  Stress Interventions Provide Counseling        Advanced Directives Status: Not addressed in this encounter.  CCM Care Plan  Allergies  Allergen Reactions   Amoxicillin Swelling    Face hands   Penicillins Swelling and Other (See Comments)    Has patient had a PCN reaction causing immediate rash, facial/tongue/throat swelling, SOB or lightheadedness with hypotension: Yes Has patient had a PCN reaction causing severe rash involving mucus membranes or skin necrosis: No Has patient had a PCN reaction that required hospitalization No phalosporin  use.   Valium [Diazepam] Other (See Comments)    Pt had shakes   Imdur [Isosorbide Nitrate]     Headache at 60mg  dose    Outpatient Encounter Medications as of 01/22/2021  Medication Sig   albuterol (VENTOLIN HFA) 108 (90 Base) MCG/ACT inhaler Inhale 2 puffs by mouth every 6 hours as needed for wheezing/shortness of breath   ALPRAZolam (XANAX) 0.5 MG tablet TAKE ONE TABLET BY MOUTH daily AS NEEDED FOR ANXIETY   atorvastatin (LIPITOR) 80 MG tablet TAKE 1 TABLET BY MOUTH  DAILY (Patient not taking: Reported on 12/01/2020)   Cholecalciferol (VITAMIN D) 2000 units CAPS Take 2,000 Units by mouth daily.   diltiazem (CARDIZEM CD) 180 MG 24 hr capsule Take 1 capsule (180 mg total) by mouth daily. (Patient not taking: Reported on 12/01/2020)   ELIQUIS 5 MG TABS tablet TAKE ONE TABLET BY MOUTH TWICE DAILY   fluticasone (FLONASE) 50 MCG/ACT nasal spray Place 2 sprays into both nostrils daily.   fluticasone (FLOVENT HFA) 110 MCG/ACT inhaler Inhale 2 puffs into the lungs in the morning and at bedtime. Rinse after use   isosorbide mononitrate (IMDUR) 30 MG 24 hr tablet Held as of 12/08/20   lansoprazole (PREVACID) 30 MG capsule Take 1 capsule (30 mg total) by mouth 2 (two) times daily.   lisinopril (ZESTRIL) 5 MG tablet Take 1 tablet (5 mg total) by mouth daily. (Patient not taking: Reported on 12/01/2020)   metoprolol tartrate (LOPRESSOR) 25 MG tablet Held as of 11/09/20 (Patient not taking: Reported on 12/01/2020)   sertraline (ZOLOFT) 100 MG tablet Take one and one-half tablets by mouth once daily   vitamin B-12 (CYANOCOBALAMIN) 1000 MCG tablet  Take 1,000 mcg by mouth daily.   No facility-administered encounter medications on file as of 01/22/2021.    Patient Active Problem List   Diagnosis Date Noted   Other social stressor 09/27/2020   Rectal bleeding    Cecal polyp    Lipoma of colon    Polyp of ascending colon    Polyp of sigmoid colon    Diverticulosis    GI bleed 07/29/2020   Vision  changes 04/26/2020   Foot callus 12/30/2019   Urinary frequency 04/14/2019   Gout 08/21/2017   Vision loss 07/30/2017   Headache 07/30/2017   Syncope 12/08/2016   Atrial fibrillation ([place]) 12/05/2016   Advance care planning 09/09/2016   Fatigue 01/20/2016   Arthritis 04/13/2015   Rhinitis 02/25/2015   Osteoarthritis of both knees 11/13/2014   Primary osteoarthritis of both knees 11/13/2014   Cough 11/13/2013   UTI (urinary tract infection) 11/21/2012   Flank pain 11/21/2012   GERD (gastroesophageal reflux disease)    Back pain 05/04/2011   URI (upper respiratory infection) 10/27/2010   Hand pain 10/27/2010   Osteoporosis 08/23/2010   UNSPECIFIED VITAMIN D DEFICIENCY 09/23/2008   DEPRESSION/ANXIETY 09/23/2008   Obesity (BMI 30.0-34.9) 06/21/2006   CORONARY ARTERY DISEASE, S/P PTCA 06/21/2006   Extrinsic asthma 06/21/2006   Essential hypertension 06/13/1999   Hyperlipidemia 03/15/1999    Conditions to be addressed/monitored: CAD, Anxiety, and Depression; Financial constraints related to outstanding/late power bill  Care Plan : LCSW Plan of Care  Updates made by Deirdre Peer, LCSW since 01/22/2021 12:00 AM     Problem: Emotional Distress due to financial hardships and concerns   Priority: High  Note:   Current barriers:    Financial constraints related to bills not getting paid- power bill at $700 per pt. Clinical Goals: Patient will work with APS to address needs related to financial concerns Clinical Interventions:  Assessment of needs, barriers , agencies contacted, as well as how impacting  Solution-Focused Strategies * Active listening / Reflection utilized  [place] strategies reviewed Collaborated with Sharyn Lull, [facility]  Review various resources, discussed options and provided patient information about  Department of Social Services ( APS ) Referral to care guide (financial support)  1:1 collaboration with primary care provider regarding  development and update of comprehensive plan of care as evidenced by provider attestation and co-signature Inter-disciplinary care team collaboration (see longitudinal plan of care) Patient Goals/Self-Care Activities: Over the next 30 days Collaborate with the community resource care guide work with the DSS/APS  Go to bank to ensure safety of bank accounts Call 911 if your money, credit cards or other personal valuables are being taken without your consent. Consider guardianship to be appointed        Follow Up Plan: SW will follow up with patient by phone over the next week and [place] will reach out to patient for assistance with financial resources.      Eduard Clos MSW, LCSW Licensed Clinical Social Worker [place] [phone]

## 2021-01-22 NOTE — Patient Instructions (Signed)
Visit Information  (Copy and paste patient goals from clinical care plan here)  The patient verbalized understanding of instructions, educational materials, and care plan provided today and declined offer to receive copy of patient instructions, educational materials, and care plan.   Telephone follow up appointment with care management team member scheduled for: next week  Eduard Clos MSW, [place] Licensed Clinical Social Worker [place] [phone]

## 2021-01-25 ENCOUNTER — Telehealth: Payer: Medicare HMO

## 2021-01-25 ENCOUNTER — Ambulatory Visit: Payer: Medicare HMO | Admitting: Family Medicine

## 2021-01-25 ENCOUNTER — Other Ambulatory Visit: Payer: Self-pay | Admitting: Family Medicine

## 2021-01-29 ENCOUNTER — Other Ambulatory Visit: Payer: Self-pay

## 2021-01-29 ENCOUNTER — Encounter: Payer: Self-pay | Admitting: Family Medicine

## 2021-01-29 ENCOUNTER — Ambulatory Visit (INDEPENDENT_AMBULATORY_CARE_PROVIDER_SITE_OTHER): Payer: Medicare HMO | Admitting: Family Medicine

## 2021-01-29 VITALS — BP 132/78 | HR 78 | Temp 97.2°F | Ht 65.0 in | Wt 208.0 lb

## 2021-01-29 DIAGNOSIS — I10 Essential (primary) hypertension: Secondary | ICD-10-CM

## 2021-01-29 DIAGNOSIS — R059 Cough, unspecified: Secondary | ICD-10-CM

## 2021-01-29 DIAGNOSIS — R195 Other fecal abnormalities: Secondary | ICD-10-CM

## 2021-01-29 DIAGNOSIS — Z23 Encounter for immunization: Secondary | ICD-10-CM | POA: Diagnosis not present

## 2021-01-29 DIAGNOSIS — R6889 Other general symptoms and signs: Secondary | ICD-10-CM | POA: Diagnosis not present

## 2021-01-29 MED ORDER — LISINOPRIL 5 MG PO TABS: 5.0000 mg | ORAL_TABLET | Freq: Every day | ORAL | 1 refills | Status: DC

## 2021-01-29 MED ORDER — LISINOPRIL 5 MG PO TABS: 5.0000 mg | ORAL_TABLET | Freq: Every day | ORAL | Status: DC

## 2021-01-29 MED ORDER — ALPRAZOLAM 0.5 MG PO TABS: ORAL_TABLET | ORAL | 2 refills | Status: DC
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
## 2021-01-29 NOTE — Patient Instructions (Addendum)
Use the albuterol if needed for the cough.  Keep taking flovent.   If your BP is above 160/90, then take an extra tab of lisinopril.  Max 2 doses of lisinopril in a day.  Update me as needed.   Try to take more fiber and let me know if the stool troubles continue.  You can try taking metamucil if needed.

## 2021-01-31 DIAGNOSIS — R195 Other fecal abnormalities: Secondary | ICD-10-CM | POA: Insufficient documentation

## 2021-01-31 NOTE — Assessment & Plan Note (Signed)
I asked her to make sure she is still using Flovent routinely and then use albuterol as needed.  She can update me if the cough does not get better or continues.  Routine inhaler cautions given to patient.

## 2021-01-31 NOTE — Assessment & Plan Note (Signed)
Discussed with patient about adding in fiber and updating me if her symptoms do not improve.

## 2021-01-31 NOTE — Assessment & Plan Note (Signed)
Average home BPs: she had had variable BPs, occ higher on home check that would improve on recheck after taking her BP meds.   Off metoprolol and isosorbide.  BP at goal.   She has cardiology f/u pending for 02/25/21.   If BP is above 160/90, then take an extra tab of lisinopril.  Max 2 doses of lisinopril in a day.  Update me as needed.   She agrees with plan.

## 2021-02-10 ENCOUNTER — Emergency Department: Payer: Medicare HMO

## 2021-02-10 ENCOUNTER — Inpatient Hospital Stay
Admission: EM | Admit: 2021-02-10 | Discharge: 2021-02-12 | DRG: 379 | Disposition: A | Discharge status: Home / Self Care | Payer: Medicare HMO | Attending: Internal Medicine | Admitting: Internal Medicine

## 2021-02-10 ENCOUNTER — Other Ambulatory Visit: Payer: Self-pay

## 2021-02-10 DIAGNOSIS — Z9842 Cataract extraction status, left eye: Secondary | ICD-10-CM

## 2021-02-10 DIAGNOSIS — Z20822 Contact with and (suspected) exposure to covid-19: Secondary | ICD-10-CM | POA: Diagnosis present

## 2021-02-10 DIAGNOSIS — K625 Hemorrhage of anus and rectum: Secondary | ICD-10-CM | POA: Diagnosis not present

## 2021-02-10 DIAGNOSIS — E669 Obesity, unspecified: Secondary | ICD-10-CM | POA: Diagnosis not present

## 2021-02-10 DIAGNOSIS — K8020 Calculus of gallbladder without cholecystitis without obstruction: Secondary | ICD-10-CM | POA: Diagnosis present

## 2021-02-10 DIAGNOSIS — K219 Gastro-esophageal reflux disease without esophagitis: Secondary | ICD-10-CM | POA: Diagnosis present

## 2021-02-10 DIAGNOSIS — Z86010 Personal history of colonic polyps: Secondary | ICD-10-CM | POA: Diagnosis not present

## 2021-02-10 DIAGNOSIS — Z96651 Presence of right artificial knee joint: Secondary | ICD-10-CM | POA: Diagnosis present

## 2021-02-10 DIAGNOSIS — Z8619 Personal history of other infectious and parasitic diseases: Secondary | ICD-10-CM | POA: Diagnosis not present

## 2021-02-10 DIAGNOSIS — F32A Depression, unspecified: Secondary | ICD-10-CM | POA: Diagnosis present

## 2021-02-10 DIAGNOSIS — I259 Chronic ischemic heart disease, unspecified: Secondary | ICD-10-CM

## 2021-02-10 DIAGNOSIS — J449 Chronic obstructive pulmonary disease, unspecified: Secondary | ICD-10-CM | POA: Diagnosis not present

## 2021-02-10 DIAGNOSIS — I2510 Atherosclerotic heart disease of native coronary artery without angina pectoris: Secondary | ICD-10-CM | POA: Diagnosis present

## 2021-02-10 DIAGNOSIS — Z87442 Personal history of urinary calculi: Secondary | ICD-10-CM | POA: Diagnosis not present

## 2021-02-10 DIAGNOSIS — I4891 Unspecified atrial fibrillation: Secondary | ICD-10-CM

## 2021-02-10 DIAGNOSIS — M170 Bilateral primary osteoarthritis of knee: Secondary | ICD-10-CM | POA: Diagnosis present

## 2021-02-10 DIAGNOSIS — Z888 Allergy status to other drugs, medicaments and biological substances status: Secondary | ICD-10-CM

## 2021-02-10 DIAGNOSIS — Z7901 Long term (current) use of anticoagulants: Secondary | ICD-10-CM

## 2021-02-10 DIAGNOSIS — E785 Hyperlipidemia, unspecified: Secondary | ICD-10-CM | POA: Diagnosis present

## 2021-02-10 DIAGNOSIS — I10 Essential (primary) hypertension: Secondary | ICD-10-CM | POA: Diagnosis not present

## 2021-02-10 DIAGNOSIS — Z8701 Personal history of pneumonia (recurrent): Secondary | ICD-10-CM | POA: Diagnosis not present

## 2021-02-10 DIAGNOSIS — Z9841 Cataract extraction status, right eye: Secondary | ICD-10-CM

## 2021-02-10 DIAGNOSIS — K5732 Diverticulitis of large intestine without perforation or abscess without bleeding: Secondary | ICD-10-CM | POA: Diagnosis not present

## 2021-02-10 DIAGNOSIS — Z961 Presence of intraocular lens: Secondary | ICD-10-CM | POA: Diagnosis present

## 2021-02-10 DIAGNOSIS — K5733 Diverticulitis of large intestine without perforation or abscess with bleeding: Principal | ICD-10-CM | POA: Diagnosis present

## 2021-02-10 DIAGNOSIS — R600 Localized edema: Secondary | ICD-10-CM | POA: Diagnosis present

## 2021-02-10 DIAGNOSIS — K5792 Diverticulitis of intestine, part unspecified, without perforation or abscess without bleeding: Secondary | ICD-10-CM | POA: Diagnosis not present

## 2021-02-10 DIAGNOSIS — F419 Anxiety disorder, unspecified: Secondary | ICD-10-CM | POA: Diagnosis not present

## 2021-02-10 DIAGNOSIS — Z880 Allergy status to penicillin: Secondary | ICD-10-CM

## 2021-02-10 DIAGNOSIS — R109 Unspecified abdominal pain: Secondary | ICD-10-CM | POA: Diagnosis not present

## 2021-02-10 DIAGNOSIS — M109 Gout, unspecified: Secondary | ICD-10-CM | POA: Diagnosis present

## 2021-02-10 DIAGNOSIS — Z8249 Family history of ischemic heart disease and other diseases of the circulatory system: Secondary | ICD-10-CM | POA: Diagnosis not present

## 2021-02-10 DIAGNOSIS — Z79899 Other long term (current) drug therapy: Secondary | ICD-10-CM

## 2021-02-10 DIAGNOSIS — Z86718 Personal history of other venous thrombosis and embolism: Secondary | ICD-10-CM | POA: Diagnosis not present

## 2021-02-10 DIAGNOSIS — Z955 Presence of coronary angioplasty implant and graft: Secondary | ICD-10-CM

## 2021-02-10 DIAGNOSIS — Z7951 Long term (current) use of inhaled steroids: Secondary | ICD-10-CM

## 2021-02-10 LAB — TYPE AND SCREEN
ABO/RH(D): A POS
Antibody Screen: NEGATIVE

## 2021-02-10 LAB — COMPREHENSIVE METABOLIC PANEL
ALT: 18 U/L (ref 0–44)
AST: 23 U/L (ref 15–41)
Albumin: 4 g/dL (ref 3.5–5.0)
Alkaline Phosphatase: 86 U/L (ref 38–126)
Anion gap: 3 — ABNORMAL LOW (ref 5–15)
BUN: 14 mg/dL (ref 8–23)
CO2: 28 mmol/L (ref 22–32)
Calcium: 9 mg/dL (ref 8.9–10.3)
Chloride: 103 mmol/L (ref 98–111)
Creatinine, Ser: 0.78 mg/dL (ref 0.44–1.00)
GFR, Estimated: >60 mL/min (ref >60)
Glucose, Bld: 112 mg/dL — ABNORMAL HIGH (ref 70–99)
Potassium: 4.1 mmol/L (ref 3.5–5.1)
Sodium: 134 mmol/L — ABNORMAL LOW (ref 135–145)
Total Bilirubin: 0.8 mg/dL (ref 0.3–1.2)
Total Protein: 7.7 g/dL (ref 6.5–8.1)

## 2021-02-10 LAB — CBC
HCT: 42.6 % (ref 36.0–46.0)
Hemoglobin: 13.5 g/dL (ref 12.0–15.0)
MCH: 28.1 pg (ref 26.0–34.0)
MCHC: 31.7 g/dL (ref 30.0–36.0)
MCV: 88.8 fL (ref 80.0–100.0)
Platelets: 222 10*3/uL (ref 150–400)
RBC: 4.8 MIL/uL (ref 3.87–5.11)
RDW: 14.3 % (ref 11.5–15.5)
WBC: 7 10*3/uL (ref 4.0–10.5)
nRBC: 0 % (ref 0.0–0.2)

## 2021-02-10 LAB — RESP PANEL BY RT-PCR (FLU A&B, COVID) ARPGX2
Influenza A by PCR: NEGATIVE
Influenza B by PCR: NEGATIVE
SARS Coronavirus 2 by RT PCR: NEGATIVE

## 2021-02-10 MED ORDER — PANTOPRAZOLE SODIUM 40 MG PO TBEC
40.0000 mg | DELAYED_RELEASE_TABLET | Freq: Every day | ORAL | Status: DC
  Administered 2021-02-10 – 2021-02-12 (×3): 40 mg via ORAL
  Filled 2021-02-10 (×3): qty 1

## 2021-02-10 MED ORDER — ENOXAPARIN SODIUM 40 MG/0.4ML IJ SOSY
40.0000 mg | PREFILLED_SYRINGE | INTRAMUSCULAR | Status: DC
  Administered 2021-02-10 – 2021-02-11 (×2): 40 mg via SUBCUTANEOUS
  Filled 2021-02-10 (×2): qty 0.4

## 2021-02-10 MED ORDER — LISINOPRIL 5 MG PO TABS
5.0000 mg | ORAL_TABLET | Freq: Every day | ORAL | Status: DC
  Administered 2021-02-10 – 2021-02-12 (×3): 5 mg via ORAL
  Filled 2021-02-10 (×3): qty 1

## 2021-02-10 MED ORDER — IOHEXOL 300 MG/ML  SOLN
100.0000 mL | Freq: Once | INTRAMUSCULAR | Status: AC | PRN
  Administered 2021-02-10: 100 mL via INTRAVENOUS

## 2021-02-10 MED ORDER — CIPROFLOXACIN IN D5W 400 MG/200ML IV SOLN
400.0000 mg | Freq: Once | INTRAVENOUS | Status: AC
  Administered 2021-02-10: 400 mg via INTRAVENOUS
  Filled 2021-02-10: qty 200

## 2021-02-10 MED ORDER — ALBUTEROL SULFATE (2.5 MG/3ML) 0.083% IN NEBU
2.5000 mg | INHALATION_SOLUTION | Freq: Four times a day (QID) | RESPIRATORY_TRACT | Status: DC
  Administered 2021-02-10 (×2): 2.5 mg via RESPIRATORY_TRACT
  Filled 2021-02-10 (×2): qty 3

## 2021-02-10 MED ORDER — ALBUTEROL SULFATE (2.5 MG/3ML) 0.083% IN NEBU: 2.5000 mg | INHALATION_SOLUTION | Freq: Four times a day (QID) | RESPIRATORY_TRACT | Status: DC | PRN

## 2021-02-10 MED ORDER — SENNA 8.6 MG PO TABS
1.0000 | ORAL_TABLET | Freq: Two times a day (BID) | ORAL | Status: DC
  Administered 2021-02-10 (×2): 8.6 mg via ORAL
  Filled 2021-02-10 (×5): qty 1

## 2021-02-10 MED ORDER — DOCUSATE SODIUM 100 MG PO CAPS
100.0000 mg | ORAL_CAPSULE | Freq: Two times a day (BID) | ORAL | Status: DC
  Administered 2021-02-10 (×2): 100 mg via ORAL
  Filled 2021-02-10 (×5): qty 1

## 2021-02-10 MED ORDER — METRONIDAZOLE 500 MG/100ML IV SOLN
500.0000 mg | Freq: Once | INTRAVENOUS | Status: AC
  Administered 2021-02-10: 500 mg via INTRAVENOUS
  Filled 2021-02-10: qty 100

## 2021-02-10 MED ORDER — ACETAMINOPHEN 325 MG PO TABS
650.0000 mg | ORAL_TABLET | Freq: Four times a day (QID) | ORAL | Status: DC | PRN
  Administered 2021-02-11 (×2): 650 mg via ORAL
  Filled 2021-02-10 (×2): qty 2

## 2021-02-10 MED ORDER — METRONIDAZOLE 500 MG/100ML IV SOLN
500.0000 mg | Freq: Two times a day (BID) | INTRAVENOUS | Status: DC
  Administered 2021-02-10 – 2021-02-12 (×4): 500 mg via INTRAVENOUS
  Filled 2021-02-10 (×5): qty 100

## 2021-02-10 MED ORDER — POLYETHYLENE GLYCOL 3350 17 G PO PACK: 17.0000 g | PACK | Freq: Every day | ORAL | Status: DC | PRN

## 2021-02-10 MED ORDER — OXYCODONE HCL 5 MG PO TABS: 5.0000 mg | ORAL_TABLET | ORAL | Status: DC | PRN

## 2021-02-10 MED ORDER — CIPROFLOXACIN IN D5W 400 MG/200ML IV SOLN
400.0000 mg | Freq: Two times a day (BID) | INTRAVENOUS | Status: DC
  Administered 2021-02-10 – 2021-02-12 (×4): 400 mg via INTRAVENOUS
  Filled 2021-02-10 (×5): qty 200

## 2021-02-10 MED ORDER — ONDANSETRON HCL 4 MG/2ML IJ SOLN: 4.0000 mg | Freq: Four times a day (QID) | INTRAMUSCULAR | Status: DC | PRN

## 2021-02-10 MED ORDER — BUDESONIDE 0.25 MG/2ML IN SUSP
2.0000 mL | Freq: Two times a day (BID) | RESPIRATORY_TRACT | Status: DC
  Administered 2021-02-10 – 2021-02-12 (×4): 0.25 mg via RESPIRATORY_TRACT
  Filled 2021-02-10 (×4): qty 2

## 2021-02-10 MED ORDER — ONDANSETRON HCL 4 MG PO TABS: 4.0000 mg | ORAL_TABLET | Freq: Four times a day (QID) | ORAL | Status: DC | PRN

## 2021-02-10 MED ORDER — VITAMIN B-12 1000 MCG PO TABS
1000.0000 ug | ORAL_TABLET | Freq: Every day | ORAL | Status: DC
  Administered 2021-02-11 – 2021-02-12 (×2): 1000 ug via ORAL
  Filled 2021-02-10 (×2): qty 1

## 2021-02-10 MED ORDER — DILTIAZEM HCL ER COATED BEADS 180 MG PO CP24
180.0000 mg | ORAL_CAPSULE | Freq: Every day | ORAL | Status: DC
  Administered 2021-02-10 – 2021-02-12 (×3): 180 mg via ORAL
  Filled 2021-02-10 (×3): qty 1

## 2021-02-10 MED ORDER — MORPHINE SULFATE (PF) 2 MG/ML IV SOLN: 2.0000 mg | INTRAVENOUS | Status: DC | PRN

## 2021-02-10 MED ORDER — ACETAMINOPHEN 650 MG RE SUPP: 650.0000 mg | Freq: Four times a day (QID) | RECTAL | Status: DC | PRN

## 2021-02-10 MED ORDER — HYDRALAZINE HCL 20 MG/ML IJ SOLN: 10.0000 mg | Freq: Four times a day (QID) | INTRAMUSCULAR | Status: DC | PRN

## 2021-02-10 NOTE — ED Triage Notes (Signed)
Pt c/o lower abd pain , last night started passing blood clots, pt is unsure if it is coming from her bowels or vaginally.

## 2021-02-10 NOTE — ED Notes (Signed)
Levada Dy RN aware of assigned bed
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
## 2021-02-10 NOTE — ED Notes (Addendum)
Per pt, pt states that this morning when going to the bathroom, she noticed blot clots in the commode that were about the size of quarter and bright red blood when she wiped. Pt is currently taking Eliquis. Pt states she has a hx of hemorrhoids.
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
## 2021-02-11 DIAGNOSIS — Z961 Presence of intraocular lens: Secondary | ICD-10-CM | POA: Diagnosis present

## 2021-02-11 DIAGNOSIS — F419 Anxiety disorder, unspecified: Secondary | ICD-10-CM | POA: Diagnosis present

## 2021-02-11 DIAGNOSIS — Z20822 Contact with and (suspected) exposure to covid-19: Secondary | ICD-10-CM | POA: Diagnosis present

## 2021-02-11 DIAGNOSIS — K8020 Calculus of gallbladder without cholecystitis without obstruction: Secondary | ICD-10-CM | POA: Diagnosis present

## 2021-02-11 DIAGNOSIS — M170 Bilateral primary osteoarthritis of knee: Secondary | ICD-10-CM | POA: Diagnosis present

## 2021-02-11 DIAGNOSIS — Z86010 Personal history of colonic polyps: Secondary | ICD-10-CM | POA: Diagnosis not present

## 2021-02-11 DIAGNOSIS — E785 Hyperlipidemia, unspecified: Secondary | ICD-10-CM | POA: Diagnosis present

## 2021-02-11 DIAGNOSIS — Z955 Presence of coronary angioplasty implant and graft: Secondary | ICD-10-CM | POA: Diagnosis not present

## 2021-02-11 DIAGNOSIS — K5732 Diverticulitis of large intestine without perforation or abscess without bleeding: Secondary | ICD-10-CM | POA: Diagnosis not present

## 2021-02-11 DIAGNOSIS — Z9842 Cataract extraction status, left eye: Secondary | ICD-10-CM | POA: Diagnosis not present

## 2021-02-11 DIAGNOSIS — Z87442 Personal history of urinary calculi: Secondary | ICD-10-CM | POA: Diagnosis not present

## 2021-02-11 DIAGNOSIS — Z8249 Family history of ischemic heart disease and other diseases of the circulatory system: Secondary | ICD-10-CM | POA: Diagnosis not present

## 2021-02-11 DIAGNOSIS — Z96651 Presence of right artificial knee joint: Secondary | ICD-10-CM | POA: Diagnosis present

## 2021-02-11 DIAGNOSIS — K5733 Diverticulitis of large intestine without perforation or abscess with bleeding: Secondary | ICD-10-CM | POA: Diagnosis present

## 2021-02-11 DIAGNOSIS — I2510 Atherosclerotic heart disease of native coronary artery without angina pectoris: Secondary | ICD-10-CM | POA: Diagnosis present

## 2021-02-11 DIAGNOSIS — F32A Depression, unspecified: Secondary | ICD-10-CM | POA: Diagnosis present

## 2021-02-11 DIAGNOSIS — Z86718 Personal history of other venous thrombosis and embolism: Secondary | ICD-10-CM | POA: Diagnosis not present

## 2021-02-11 DIAGNOSIS — E669 Obesity, unspecified: Secondary | ICD-10-CM | POA: Diagnosis present

## 2021-02-11 DIAGNOSIS — Z9841 Cataract extraction status, right eye: Secondary | ICD-10-CM | POA: Diagnosis not present

## 2021-02-11 DIAGNOSIS — I4891 Unspecified atrial fibrillation: Secondary | ICD-10-CM | POA: Diagnosis present

## 2021-02-11 DIAGNOSIS — Z8701 Personal history of pneumonia (recurrent): Secondary | ICD-10-CM | POA: Diagnosis not present

## 2021-02-11 DIAGNOSIS — J449 Chronic obstructive pulmonary disease, unspecified: Secondary | ICD-10-CM | POA: Diagnosis present

## 2021-02-11 DIAGNOSIS — K219 Gastro-esophageal reflux disease without esophagitis: Secondary | ICD-10-CM | POA: Diagnosis present

## 2021-02-11 DIAGNOSIS — I10 Essential (primary) hypertension: Secondary | ICD-10-CM | POA: Diagnosis present

## 2021-02-11 DIAGNOSIS — Z8619 Personal history of other infectious and parasitic diseases: Secondary | ICD-10-CM | POA: Diagnosis not present

## 2021-02-11 DIAGNOSIS — K625 Hemorrhage of anus and rectum: Secondary | ICD-10-CM | POA: Diagnosis present

## 2021-02-11 LAB — BASIC METABOLIC PANEL
Anion gap: 7 (ref 5–15)
BUN: 11 mg/dL (ref 8–23)
CO2: 26 mmol/L (ref 22–32)
Calcium: 8.6 mg/dL — ABNORMAL LOW (ref 8.9–10.3)
Chloride: 103 mmol/L (ref 98–111)
Creatinine, Ser: 0.78 mg/dL (ref 0.44–1.00)
GFR, Estimated: >60 mL/min (ref >60)
Glucose, Bld: 98 mg/dL (ref 70–99)
Potassium: 3.7 mmol/L (ref 3.5–5.1)
Sodium: 136 mmol/L (ref 135–145)

## 2021-02-11 LAB — CBC
HCT: 38 % (ref 36.0–46.0)
Hemoglobin: 12.5 g/dL (ref 12.0–15.0)
MCH: 28.8 pg (ref 26.0–34.0)
MCHC: 32.9 g/dL (ref 30.0–36.0)
MCV: 87.6 fL (ref 80.0–100.0)
Platelets: 191 10*3/uL (ref 150–400)
RBC: 4.34 MIL/uL (ref 3.87–5.11)
RDW: 14.3 % (ref 11.5–15.5)
WBC: 6.2 10*3/uL (ref 4.0–10.5)
nRBC: 0 % (ref 0.0–0.2)

## 2021-02-11 MED ORDER — ATORVASTATIN CALCIUM 20 MG PO TABS
80.0000 mg | ORAL_TABLET | Freq: Every evening | ORAL | Status: DC
  Administered 2021-02-11: 80 mg via ORAL
  Filled 2021-02-11: qty 4

## 2021-02-11 MED ORDER — SODIUM CHLORIDE 0.9 % IV SOLN: INTRAVENOUS | Status: DC | PRN

## 2021-02-11 MED ORDER — SERTRALINE HCL 50 MG PO TABS
150.0000 mg | ORAL_TABLET | Freq: Every day | ORAL | Status: DC
  Administered 2021-02-11 – 2021-02-12 (×2): 150 mg via ORAL
  Filled 2021-02-11 (×2): qty 3

## 2021-02-11 MED ORDER — ADULT MULTIVITAMIN W/MINERALS CH
1.0000 | ORAL_TABLET | Freq: Every day | ORAL | Status: DC
  Administered 2021-02-12: 1 via ORAL
  Filled 2021-02-11: qty 1

## 2021-02-11 MED ORDER — ENSURE ENLIVE PO LIQD
237.0000 mL | Freq: Two times a day (BID) | ORAL | Status: DC
  Administered 2021-02-12: 237 mL via ORAL

## 2021-02-11 NOTE — Progress Notes (Signed)
Initial Nutrition Assessment  DOCUMENTATION CODES:   Obesity unspecified  INTERVENTION:   -Ensure Enlive po BID, each supplement provides 350 kcal and 20 grams of protein  -MVI with minerals daily  NUTRITION DIAGNOSIS:   Inadequate oral intake related to altered GI function as evidenced by per patient/family report.  GOAL:   Patient will meet greater than or equal to 90% of their needs  MONITOR:   PO intake, Supplement acceptance, Labs, Weight trends, Skin, I & O's  REASON FOR ASSESSMENT:   Malnutrition Screening Tool    ASSESSMENT:   [name] is a 85 y.o. female with PMH significant for obesity, HTN, HLD, CAD, and DVT on Eliquis, history of hemorrhoidal bleeding, osteoarthritis, gout.  Pt admitted with BRBPR.   Reviewed I/O's: -1.1 L x 24 hours  UOP:1 .4 L x 24 hours   Spoke with pt at bedside, who reports feeling unwell today. She reports a decreased appetite over the past 6 months, which she attributes to stomach pain and early satiety. Per pt, she consumes 2-3 meals per day and tends to get full quickly and consumes about 50% less of what she eats at baseline. Per pt, she consumed 100% of breakfast, pt only consumes about 30% of her lunch meal tray.   Pt shares her UBW is around 220# and estimates she has lost 15-20# over the past 6 months. Reviewed wt hx; pt has experienced a 6.3% wt loss over the past 6 months, which is not significant for time frame.   Discussed importance of good meal and supplement intake to promote healing. Pt amenable to supplements.   Per chart review, anticipate discharge home tomorrow.   Labs reviewed.   NUTRITION - FOCUSED PHYSICAL EXAM:  Flowsheet Row Most Recent Value  Orbital Region Mild depletion  Upper Arm Region No depletion  Thoracic and Lumbar Region No depletion  Buccal Region No depletion  Temple Region Mild depletion  Clavicle Bone Region No depletion  Clavicle and Acromion Bone Region No depletion  Scapular  Bone Region No depletion  Dorsal Hand No depletion  Patellar Region No depletion  Anterior Thigh Region No depletion  Posterior Calf Region No depletion  Edema (RD Assessment) None  Hair Reviewed  Eyes Reviewed  Mouth Reviewed  Skin Reviewed  Nails Reviewed       Diet Order:   Diet Order             DIET SOFT Room service appropriate? Yes; Fluid consistency: Thin  Diet effective now                   EDUCATION NEEDS:   Education needs have been addressed  Skin:  Skin Assessment: Reviewed RN Assessment  Last BM:  02/11/21  Height:   Ht Readings from Last 1 Encounters:  02/10/21 5\' 5"  (1.651 m)    Weight:   Wt Readings from Last 1 Encounters:  02/10/21 91.9 kg    Ideal Body Weight:  56.8 kg  BMI:  Body mass index is 33.71 kg/m.  Estimated Nutritional Needs:   Kcal:  1800-2000  Protein:  90-105 grams  Fluid:  > 1.8 L    Loistine Chance, RD, LDN, [place] Registered Dietitian II Certified Diabetes Care and Education Specialist Please refer to [facility] for RD and/or RD on-call/weekend/after hours pager

## 2021-02-11 NOTE — Progress Notes (Signed)
PROGRESS NOTE    [name]  WVP:[id number] DOB: [birth year] DOA: 02/10/2021 PCP: [name], MD   Chief complaint.  Rectal bleeding. Brief Narrative:  [name] is a 85 y.o. female with PMH significant for obesity, HTN, HLD, CAD, and DVT on Eliquis, history of hemorrhoidal bleeding, osteoarthritis, gout. Patient presented to the ED this morning with complaint of crampy lower abdominal pain since last night.  She also started passing blood clots since this morning and hence presented to the ED   Assessment & Plan:   Principal Problem:   BRBPR (bright red blood per rectum)  Acute sigmoid diverticulitis. Rectal bleeding secondary to diverticulosis. Patient conditions are relatively stable, has been seen by GI, recommended holding Eliquis for another 24 hours, may restart tomorrow. Continue antibiotics with Cipro and Flagyl.  Essential hypertension. Continue home medicines  COPD. Stable.    DVT prophylaxis: SCDs Code Status: full Family Communication:  Disposition Plan:    Status is: Observation         I/O last 3 completed shifts: In: 310.6 [IV Piggyback:310.6] Out: 1400 [Urine:1400] No intake/output data recorded.     Consultants:  None  Procedures: None  Antimicrobials: Flagyl and Cipro. Subjective: Patient doing better today, denies any abdominal pain or nausea vomiting.  No additional rectal bleeding today, actually she has not had a bowel movement since yesterday. No fever or chills. No short of breath or cough.  Objective: Vitals:   02/11/21 0805 02/11/21 1000 02/11/21 1048 02/11/21 1113  BP: (!) 143/64  131/78 (!) 148/79  Pulse: (!) 57 62 97 (!) 57  Resp: 16  18 20   Temp: 98.1 F (36.7 C)  97.6 F (36.4 C) 97.8 F (36.6 C)  TempSrc:      SpO2: 92%  100% 94%  Weight:      Height:        Intake/Output Summary (Last 24 hours) at 02/11/2021 1251 Last data filed at 02/11/2021 1012 Gross per 24 hour  Intake 310.64 ml   Output 1400 ml  Net -1089.36 ml   Filed Weights   02/10/21 2009  Weight: 91.9 kg    Examination:  General exam: Appears calm and comfortable  Respiratory system: Clear to auscultation. Respiratory effort normal. Cardiovascular system: S1 & S2 heard, RRR. No JVD, murmurs, rubs, gallops or clicks. No pedal edema. Gastrointestinal system: Abdomen is nondistended, soft and nontender. No organomegaly or masses felt. Normal bowel sounds heard. Central nervous system: Alert and oriented. No focal neurological deficits. Extremities: Symmetric 5 x 5 power. Skin: No rashes, lesions or ulcers Psychiatry: Judgement and insight appear normal. Mood & affect appropriate.     Data Reviewed: I have personally reviewed following labs and imaging studies  CBC: Recent Labs  Lab 02/10/21 0740 02/11/21 0422  WBC 7.0 6.2  HGB 13.5 12.5  HCT 42.6 38.0  MCV 88.8 87.6  PLT 222 546   Basic Metabolic Panel: Recent Labs  Lab 02/10/21 0740 02/11/21 0422  NA 134* 136  K 4.1 3.7  CL 103 103  CO2 28 26  GLUCOSE 112* 98  BUN 14 11  CREATININE 0.78 0.78  CALCIUM 9.0 8.6*   GFR: Estimated Creatinine Clearance: 59.7 mL/min (by C-G formula based on SCr of 0.78 mg/dL). Liver Function Tests: Recent Labs  Lab 02/10/21 0740  AST 23  ALT 18  ALKPHOS 86  BILITOT 0.8  PROT 7.7  ALBUMIN 4.0   No results for input(s): LIPASE, AMYLASE in the last 168 hours. No  results for input(s): AMMONIA in the last 168 hours. Coagulation Profile: No results for input(s): INR, PROTIME in the last 168 hours. Cardiac Enzymes: No results for input(s): CKTOTAL, CKMB, CKMBINDEX, TROPONINI in the last 168 hours. BNP (last 3 results) No results for input(s): PROBNP in the last 8760 hours. HbA1C: No results for input(s): HGBA1C in the last 72 hours. CBG: No results for input(s): GLUCAP in the last 168 hours. Lipid Profile: No results for input(s): CHOL, HDL, LDLCALC, TRIG, CHOLHDL, LDLDIRECT in the last 72  hours. Thyroid Function Tests: No results for input(s): TSH, T4TOTAL, FREET4, T3FREE, THYROIDAB in the last 72 hours. Anemia Panel: No results for input(s): VITAMINB12, FOLATE, FERRITIN, TIBC, IRON, RETICCTPCT in the last 72 hours. Sepsis Labs: No results for input(s): PROCALCITON, LATICACIDVEN in the last 168 hours.  Recent Results (from the past 240 hour(s))  Resp Panel by RT-PCR (Flu A&B, Covid) Nasopharyngeal Swab     Status: None   Collection Time: 02/10/21 10:32 AM   Specimen: Nasopharyngeal Swab; Nasopharyngeal(NP) swabs in vial transport medium  Result Value Ref Range Status   SARS Coronavirus 2 by RT PCR NEGATIVE NEGATIVE Final    Comment: (NOTE) SARS-CoV-2 target nucleic acids are NOT DETECTED.  The SARS-CoV-2 RNA is generally detectable in upper respiratory specimens during the acute phase of infection. The lowest concentration of SARS-CoV-2 viral copies this assay can detect is 138 copies/mL. A negative result does not preclude SARS-Cov-2 infection and should not be used as the sole basis for treatment or other patient management decisions. A negative result may occur with  improper specimen collection/handling, submission of specimen other than nasopharyngeal swab, presence of viral mutation(s) within the areas targeted by this assay, and inadequate number of viral copies(<138 copies/mL). A negative result must be combined with clinical observations, patient history, and epidemiological information. The expected result is Negative.  Fact Sheet for Patients:  EntrepreneurPulse.com.au  Fact Sheet for Healthcare Providers:  IncredibleEmployment.be  This test is no t yet approved or cleared by the [place] FDA and  has been authorized for detection and/or diagnosis of SARS-CoV-2 by FDA under an Emergency Use Authorization (EUA). This EUA will remain  in effect (meaning this test can be used) for the duration of the COVID-19  declaration under Section 564(b)(1) of the Act, 21 U.S.C.section 360bbb-3(b)(1), unless the authorization is terminated  or revoked sooner.       Influenza A by PCR NEGATIVE NEGATIVE Final   Influenza B by PCR NEGATIVE NEGATIVE Final    Comment: (NOTE) The Xpert Xpress SARS-CoV-2/FLU/RSV plus assay is intended as an aid in the diagnosis of influenza from Nasopharyngeal swab specimens and should not be used as a sole basis for treatment. Nasal washings and aspirates are unacceptable for Xpert Xpress SARS-CoV-2/FLU/RSV testing.  Fact Sheet for Patients: EntrepreneurPulse.com.au  Fact Sheet for Healthcare Providers: IncredibleEmployment.be  This test is not yet approved or cleared by the [place] FDA and has been authorized for detection and/or diagnosis of SARS-CoV-2 by FDA under an Emergency Use Authorization (EUA). This EUA will remain in effect (meaning this test can be used) for the duration of the COVID-19 declaration under Section 564(b)(1) of the Act, 21 U.S.C. section 360bbb-3(b)(1), unless the authorization is terminated or revoked.  Performed at [facility], [address], [place], [place] [address]          Radiology Studies: CT ABDOMEN PELVIS W CONTRAST  Result Date: 02/10/2021 CLINICAL DATA:  Lower abdominal pain. Bleeding. Diverticulitis suspected. EXAM: CT  ABDOMEN AND PELVIS WITH CONTRAST TECHNIQUE: Multidetector CT imaging of the abdomen and pelvis was performed using the standard protocol following bolus administration of intravenous contrast. CONTRAST:  181mL OMNIPAQUE IOHEXOL 300 MG/ML  SOLN COMPARISON:  07/29/2020 FINDINGS: Lower chest: Lung bases are clear.  There is cardiomegaly. Hepatobiliary: Multiple gallstones dependent in the gallbladder. No CT evidence of cholecystitis or obstruction. No focal liver parenchymal finding. Pancreas: Normal Spleen: Normal Adrenals/Urinary Tract: Adrenal glands are  normal. Kidneys are normal. No cyst, mass, stone or hydronephrosis. Bladder is normal. Stomach/Bowel: Stomach and small intestine are normal. There is diverticulosis of the left colon. Mild stranding of the fat in the proximal sigmoid region is consistent with mild diverticulitis. No advanced finding. Vascular/Lymphatic: Aortic atherosclerosis. No aneurysm. IVC is normal. No adenopathy. Reproductive: No pelvic mass. Other: No free fluid or air. Musculoskeletal: Chronic degenerative changes affect the lumbar spine. IMPRESSION: Diverticulosis of the left colon. Suspicion of low level diverticulitis in the proximal sigmoid region. No advanced finding. Chololithiasis without CT evidence of cholecystitis or obstruction. Aortic Atherosclerosis (ICD10-I70.0). Electronically Signed   By: Nelson Chimes M.D.   On: 02/10/2021 10:22        Scheduled Meds:  atorvastatin  80 mg Oral QPM   budesonide  2 mL Inhalation BID   diltiazem  180 mg Oral Daily   docusate sodium  100 mg Oral BID   enoxaparin (LOVENOX) injection  40 mg Subcutaneous Q24H   lisinopril  5 mg Oral Daily   pantoprazole  40 mg Oral Daily   senna  1 tablet Oral BID   sertraline  150 mg Oral Daily   vitamin B-12  1,000 mcg Oral Daily   Continuous Infusions:  ciprofloxacin 400 mg (02/11/21 1012)   metronidazole 500 mg (02/11/21 1108)     LOS: 0 days    Time spent: 22 minutes    Sharen Hones, MD [facility]   To contact the attending provider between 7A-7P or the covering provider during after hours 7P-7A, please log into the web site [email or web address] and access using universal [place] password for that web site. If you do not have the password, please call the hospital operator.  02/11/2021, 12:51 PM

## 2021-02-11 NOTE — TOC Progression Note (Signed)
Transition of Care [facility]) - Progression Note    Patient Details  Name: [name] MRN: [id number] Date of Birth: Mar 08, 1936  Transition of Care [facility]) CM/SW [place], RN Phone Number: 02/11/2021, 1:53 PM  Clinical Narrative:   Patient is from home where she is independent, anticipate DC tomorrow, NO obvious TOC needs         Expected Discharge Plan and Services                                                 Social Determinants of Health (SDOH) Interventions    Readmission Risk Interventions No flowsheet data found.
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
## 2021-02-11 NOTE — Plan of Care (Signed)
°  Problem: Coping: °Goal: Level of anxiety will decrease °Outcome: Progressing °  °

## 2021-02-12 DIAGNOSIS — K625 Hemorrhage of anus and rectum: Secondary | ICD-10-CM | POA: Diagnosis not present

## 2021-02-12 DIAGNOSIS — K5732 Diverticulitis of large intestine without perforation or abscess without bleeding: Secondary | ICD-10-CM | POA: Diagnosis not present

## 2021-02-12 LAB — CBC
HCT: 37.8 % (ref 36.0–46.0)
Hemoglobin: 12.2 g/dL (ref 12.0–15.0)
MCH: 28.2 pg (ref 26.0–34.0)
MCHC: 32.3 g/dL (ref 30.0–36.0)
MCV: 87.3 fL (ref 80.0–100.0)
Platelets: 191 10*3/uL (ref 150–400)
RBC: 4.33 MIL/uL (ref 3.87–5.11)
RDW: 14.5 % (ref 11.5–15.5)
WBC: 7 10*3/uL (ref 4.0–10.5)
nRBC: 0 % (ref 0.0–0.2)

## 2021-02-12 LAB — BASIC METABOLIC PANEL
Anion gap: 6 (ref 5–15)
BUN: 15 mg/dL (ref 8–23)
CO2: 24 mmol/L (ref 22–32)
Calcium: 8.8 mg/dL — ABNORMAL LOW (ref 8.9–10.3)
Chloride: 104 mmol/L (ref 98–111)
Creatinine, Ser: 0.91 mg/dL (ref 0.44–1.00)
GFR, Estimated: >60 mL/min (ref >60)
Glucose, Bld: 113 mg/dL — ABNORMAL HIGH (ref 70–99)
Potassium: 4 mmol/L (ref 3.5–5.1)
Sodium: 134 mmol/L — ABNORMAL LOW (ref 135–145)

## 2021-02-12 MED ORDER — CIPROFLOXACIN HCL 500 MG PO TABS: 500.0000 mg | ORAL_TABLET | Freq: Two times a day (BID) | ORAL | 0 refills | Status: AC

## 2021-02-12 MED ORDER — METRONIDAZOLE 500 MG PO TABS: 500.0000 mg | ORAL_TABLET | Freq: Three times a day (TID) | ORAL | 0 refills | Status: AC

## 2021-02-12 NOTE — Progress Notes (Signed)
Patient was given verbal and written discharge instruction, she states she will keep all appointments, and comply with medication regimen. Waiting on ride

## 2021-02-12 NOTE — Discharge Summary (Signed)
Physician Discharge Summary  Patient ID: [name] MRN: [id number] DOB/AGE: Aug 24, 1935 85 y.o.  Admit date: 02/10/2021 Discharge date: 02/12/2021  Admission Diagnoses:  Discharge Diagnoses:  Principal Problem:   BRBPR (bright red blood per rectum) Active Problems:   Sigmoid diverticulitis   Discharged Condition: good  Hospital Course:  [name] is a 85 y.o. female with PMH significant for obesity, HTN, HLD, CAD, and DVT on Eliquis, history of hemorrhoidal bleeding, osteoarthritis, gout. Patient presented to the ED this morning with complaint of crampy lower abdominal pain since last night.  She also started passing blood clots since this morning and hence presented to the ED.  Acute sigmoid diverticulitis. Rectal bleeding secondary to diverticulitis. Patient CT scan of abdomen/pelvis showed evidence of sigmoid diverticulitis.  Patient has been seen by GI, started on Cipro and Flagyl. Condition had improved today, no longer has any GI bleed since admission.  At this point, patient is medically stable to be discharged.  We will restart Eliquis for her DVT.  History of DVT PE Resume Eliquis.  Essential hypertension. Continue home medicines  COPD. Stable.    Consults: GI  Significant Diagnostic Studies:   Treatments: Antibiotics with Cipro and Flagyl.  Discharge Exam: Blood pressure (!) 159/62, pulse (!) 58, temperature 97.6 F (36.4 C), resp. rate 16, height 5\' 5"  (1.651 m), weight 91.9 kg, SpO2 96 %. General appearance: alert and cooperative Resp: clear to auscultation bilaterally Cardio: regular rate and rhythm, S1, S2 normal, no murmur, click, rub or gallop GI: soft, non-tender; bowel sounds normal; no masses,  no organomegaly Extremities: extremities normal, atraumatic, no cyanosis or edema  Disposition: Discharge disposition: 01-Home or Self Care       Discharge Instructions     Diet general   Complete by: As directed    Soft diet for 3  days then resume regular heart healthy diet   Increase activity slowly   Complete by: As directed       Allergies as of 02/12/2021       Reactions   Amoxicillin Swelling   Face hands   Penicillins Swelling, Other (See Comments)   Has patient had a PCN reaction causing immediate rash, facial/tongue/throat swelling, SOB or lightheadedness with hypotension: Yes Has patient had a PCN reaction causing severe rash involving mucus membranes or skin necrosis: No Has patient had a PCN reaction that required hospitalization No phalosporin use.   Valium [diazepam] Other (See Comments)   Pt had shakes   Imdur [isosorbide Nitrate]    Headache at 60mg  dose        Medication List     TAKE these medications    albuterol 108 (90 Base) MCG/ACT inhaler Commonly known as: VENTOLIN HFA Inhale 2 puffs by mouth every 6 hours as needed for wheezing/shortness of breath   ALPRAZolam 0.5 MG tablet Commonly known as: XANAX TAKE ONE TABLET BY MOUTH daily AS NEEDED FOR ANXIETY What changed:  how much to take how to take this when to take this reasons to take this   atorvastatin 80 MG tablet Commonly known as: LIPITOR TAKE 1 TABLET BY MOUTH  DAILY   ciprofloxacin 500 MG tablet Commonly known as: Cipro Take 1 tablet (500 mg total) by mouth 2 (two) times daily for 5 days.   diltiazem 180 MG 24 hr capsule Commonly known as: CARDIZEM CD Take 1 capsule (180 mg total) by mouth daily.   Eliquis 5 MG Tabs tablet Generic drug: apixaban TAKE ONE TABLET BY MOUTH TWICE  DAILY What changed: how much to take   fluticasone 110 MCG/ACT inhaler Commonly known as: Flovent HFA Inhale 2 puffs into the lungs in the morning and at bedtime. Rinse after use   fluticasone 50 MCG/ACT nasal spray Commonly known as: FLONASE USE TWO SPRAYS in each nostril ONCE DAILY What changed: See the new instructions.   lansoprazole 30 MG capsule Commonly known as: PREVACID Take 1 capsule (30 mg total) by mouth 2 (two)  times daily.   lisinopril 5 MG tablet Commonly known as: ZESTRIL Take 1 tablet (5 mg total) by mouth daily. If BP is above 160/90, then take an extra tab of lisinopril.  Max 2 doses of lisinopril in a day.   metroNIDAZOLE 500 MG tablet Commonly known as: Flagyl Take 1 tablet (500 mg total) by mouth 3 (three) times daily for 5 days.   sertraline 100 MG tablet Commonly known as: ZOLOFT Take one and one-half tablets by mouth once daily What changed:  how much to take how to take this when to take this   vitamin B-12 1000 MCG tablet Commonly known as: CYANOCOBALAMIN Take 1,000 mcg by mouth daily.   Vitamin D 50 MCG (2000 UT) Caps Take 2,000 Units by mouth daily.        Follow-up Information     Virgel Manifold, MD Follow up in 1 month(s).   Specialty: Gastroenterology Contact information: [place] [address] [phone]         [name], MD Follow up in 1 week(s).   Specialty: Family Medicine Contact information: [place] [address] [phone]                 Signed: Sharen Hones 02/12/2021, 10:06 AM

## 2021-02-12 NOTE — Plan of Care (Signed)

## 2021-02-15 ENCOUNTER — Telehealth: Payer: Self-pay

## 2021-02-15 ENCOUNTER — Telehealth: Payer: Self-pay | Admitting: Family Medicine

## 2021-02-15 NOTE — Telephone Encounter (Signed)
Spoke with patient and she has not developed any sx. She did have a covid test done at the hospital when she was there and that was negative. I advised patient as long she she does not develop any sx she would be fine to come to her appt on 02/18/21. Advised patient to call back if she does develop any sx and patient verbalized understanding.

## 2021-02-15 NOTE — Telephone Encounter (Signed)
LMTCB

## 2021-02-15 NOTE — Telephone Encounter (Signed)
Pt called stating that she has been around her grand daughter who was tested positive for covid. Pt states that she doesn't,t have away to get tested for covid. Pt is asking should she keep her appt on 02/18/21. Please advise.

## 2021-02-15 NOTE — Telephone Encounter (Signed)
Transition Care Management Follow-up Telephone Call Date of discharge and from where: 12/2 from [facility]  How have you been since you were released from the hospital? Patient states she is feeling better Any questions or concerns? Yes  Items Reviewed: Did the pt receive and understand the discharge instructions provided? Yes  Medications obtained and verified? Yes  Other? No  Any new allergies since your discharge? No  Dietary orders reviewed? Yes Do you have support at home? Yes   Home Care and Equipment/Supplies: Were home health services ordered? no If so, what is the name of the agency? N/A  Has the agency set up a time to come to the patient's home? no Were any new equipment or medical supplies ordered?  No What is the name of the medical supply agency? N/A Were you able to get the supplies/equipment? not applicable Do you have any questions related to the use of the equipment or supplies? No  Functional Questionnaire: (I = Independent and D = Dependent) ADLs: I  Bathing/Dressing- I  Meal Prep- I  Eating- I  Maintaining continence- I  Transferring/Ambulation- I with use of walker  Managing Meds- I  Follow up appointments reviewed:  PCP Hospital f/u appt confirmed? Yes  Scheduled to see [name] on 02/18/21 @ 11:30am. [facility] f/u appt confirmed? No  , patient states she is waiting for GI office to call back Are transportation arrangements needed? No , transportation arrangements were made If their condition worsens, is the pt aware to call PCP or go to the Emergency Dept.? Yes Was the patient provided with contact information for the PCP's office or ED? Yes Was to pt encouraged to call back with questions or concerns? Yes

## 2021-02-16 ENCOUNTER — Ambulatory Visit: Payer: Medicare HMO | Admitting: Family Medicine

## 2021-02-16 ENCOUNTER — Other Ambulatory Visit: Payer: Self-pay

## 2021-02-16 ENCOUNTER — Telehealth: Payer: Self-pay

## 2021-02-16 ENCOUNTER — Emergency Department
Admission: EM | Admit: 2021-02-16 | Discharge: 2021-02-16 | Disposition: A | Discharge status: Home / Self Care | Payer: Medicare HMO | Attending: Emergency Medicine | Admitting: Emergency Medicine

## 2021-02-16 ENCOUNTER — Ambulatory Visit: Payer: Medicare HMO | Admitting: Gastroenterology

## 2021-02-16 DIAGNOSIS — R9431 Abnormal electrocardiogram [ECG] [EKG]: Secondary | ICD-10-CM | POA: Diagnosis not present

## 2021-02-16 DIAGNOSIS — Z7901 Long term (current) use of anticoagulants: Secondary | ICD-10-CM | POA: Diagnosis not present

## 2021-02-16 DIAGNOSIS — Z79899 Other long term (current) drug therapy: Secondary | ICD-10-CM | POA: Insufficient documentation

## 2021-02-16 DIAGNOSIS — I10 Essential (primary) hypertension: Secondary | ICD-10-CM | POA: Diagnosis not present

## 2021-02-16 DIAGNOSIS — J45909 Unspecified asthma, uncomplicated: Secondary | ICD-10-CM | POA: Insufficient documentation

## 2021-02-16 DIAGNOSIS — U071 COVID-19: Secondary | ICD-10-CM | POA: Diagnosis not present

## 2021-02-16 DIAGNOSIS — R42 Dizziness and giddiness: Secondary | ICD-10-CM

## 2021-02-16 DIAGNOSIS — R5381 Other malaise: Secondary | ICD-10-CM | POA: Diagnosis not present

## 2021-02-16 DIAGNOSIS — Z96653 Presence of artificial knee joint, bilateral: Secondary | ICD-10-CM | POA: Diagnosis not present

## 2021-02-16 DIAGNOSIS — Z7951 Long term (current) use of inhaled steroids: Secondary | ICD-10-CM | POA: Insufficient documentation

## 2021-02-16 DIAGNOSIS — I2510 Atherosclerotic heart disease of native coronary artery without angina pectoris: Secondary | ICD-10-CM | POA: Diagnosis not present

## 2021-02-16 LAB — RESP PANEL BY RT-PCR (FLU A&B, COVID) ARPGX2
Influenza A by PCR: NEGATIVE
Influenza B by PCR: NEGATIVE
SARS Coronavirus 2 by RT PCR: POSITIVE — AB

## 2021-02-16 LAB — URINALYSIS, COMPLETE (UACMP) WITH MICROSCOPIC
Bacteria, UA: NONE SEEN
Bilirubin Urine: NEGATIVE
Glucose, UA: NEGATIVE mg/dL
Hgb urine dipstick: NEGATIVE
Ketones, ur: 20 mg/dL — AB
Nitrite: NEGATIVE
Protein, ur: NEGATIVE mg/dL
Specific Gravity, Urine: 1.024 (ref 1.005–1.030)
pH: 5 (ref 5.0–8.0)

## 2021-02-16 LAB — BASIC METABOLIC PANEL
Anion gap: 10 (ref 5–15)
BUN: 16 mg/dL (ref 8–23)
CO2: 23 mmol/L (ref 22–32)
Calcium: 8.8 mg/dL — ABNORMAL LOW (ref 8.9–10.3)
Chloride: 101 mmol/L (ref 98–111)
Creatinine, Ser: 0.92 mg/dL (ref 0.44–1.00)
GFR, Estimated: >60 mL/min (ref >60)
Glucose, Bld: 88 mg/dL (ref 70–99)
Potassium: 4.3 mmol/L (ref 3.5–5.1)
Sodium: 134 mmol/L — ABNORMAL LOW (ref 135–145)

## 2021-02-16 LAB — CBC
HCT: 42.6 % (ref 36.0–46.0)
Hemoglobin: 13.8 g/dL (ref 12.0–15.0)
MCH: 29 pg (ref 26.0–34.0)
MCHC: 32.4 g/dL (ref 30.0–36.0)
MCV: 89.5 fL (ref 80.0–100.0)
Platelets: 187 10*3/uL (ref 150–400)
RBC: 4.76 MIL/uL (ref 3.87–5.11)
RDW: 14.6 % (ref 11.5–15.5)
WBC: 8.4 10*3/uL (ref 4.0–10.5)
nRBC: 0 % (ref 0.0–0.2)

## 2021-02-16 MED ORDER — NIRMATRELVIR/RITONAVIR (PAXLOVID)TABLET: 3.0000 | ORAL_TABLET | Freq: Two times a day (BID) | ORAL | 0 refills | Status: DC

## 2021-02-16 NOTE — Telephone Encounter (Signed)
Noted. Thanks.

## 2021-02-16 NOTE — ED Notes (Signed)
Date and time results received: 02/16/21 6:23 PM  (use smartphrase ".now" to insert current time)  Test: COVID Critical Value: +  Name of Provider Notified: Dr. Kerman Passey  Orders Received? Or Actions Taken?: see chart

## 2021-02-16 NOTE — ED Triage Notes (Signed)
Pt comes via EMS from home with c/o dizziness for few months. Pt states she got up to cook and got dizzy.  EMs reports pt just dc from hospital on Friday for same.  BP-150/73 HR-60 O2-98% RA  Pt states BP has been elevated.

## 2021-02-16 NOTE — Telephone Encounter (Signed)
[facility] - Client TELEPHONE ADVICE RECORD AccessNurse Patient Name: [facility] BRE EZE Gender: Female DOB: 31-Dec-1935 Age: 85 Y 1 M 10 D Return Phone Number: [phone] (Primary), [phone] (Secondary) Address: [place] [address] Client [facility] - Client Client Site [place] - Day Provider Renford Dills - MD Contact Type Call Who Is Calling Patient / Member / Family / Caregiver Call Type Triage / Clinical Relationship To Patient Self Return Phone Number [phone] (Primary) Chief Complaint Heart palpitations or irregular heartbeat Reason for Call Symptomatic / Request for [place] states she has a rapid heart rate and her blood pressure was 140/70 hr 90 then 150/69 hr 75. Translation No Nurse Assessment Nurse: Raphael Gibney, RN, Vanita Ingles Date/Time (Eastern Time): 02/16/2021 11:12:20 AM Confirm and document reason for call. If symptomatic, describe symptoms. ---Caller states her BP 142/71 pulse 90 and and 150/69 earlier. has appt on Thursday. had abd pain earlier but not now. Was discharged from hospital on Friday for GI issues. Does the patient have any new or worsening symptoms? ---Yes Will a triage be completed? ---Yes Related visit to physician within the last 2 weeks? ---No Does the PT have any chronic conditions? (i.e. diabetes, asthma, this includes High risk factors for pregnancy, etc.) ---Yes List chronic conditions. ---HTN; Is this a behavioral health or substance abuse call? ---No Guidelines Guideline Title Affirmed Question Affirmed Notes Nurse Date/Time (Eastern Time) Heart Rate and Heartbeat Questions Age > 60 years (Exception: brief heartbeat symptoms that went away and now feels well) Raphael Gibney, Therapist, sports, Vanita Ingles 02/16/2021 11:28:04 AM Disp. Time Eilene Ghazi Time) Disposition Final User PLEASE NOTE: All timestamps contained within  this report are represented as [place] Standard Time. CONFIDENTIALTY NOTICE: This fax transmission is intended only for the addressee. It contains information that is legally privileged, confidential or otherwise protected from use or disclosure. If you are not the intended recipient, you are strictly prohibited from reviewing, disclosing, copying using or disseminating any of this information or taking any action in reliance on or regarding this information. If you have received this fax in error, please notify us immediately by telephone so that we can arrange for its return to [place]. Phone: [phone], Toll-Free: [phone], Fax: [phone] Page: 2 of 2 Call Id: [id number] 02/16/2021 11:38:16 AM See HCP within 4 Hours (or PCP triage) Yes Raphael Gibney, RN, Doreatha Lew Disagree/Comply Disagree Caller Understands Yes PreDisposition Call Doctor Care Advice Given Per Guideline SEE HCP (OR PCP TRIAGE) WITHIN 4 HOURS: * IF OFFICE WILL BE OPEN: You need to be seen within the next 3 or 4 hours. Call your doctor (or NP/PA) now or as soon as the office opens. Comments User: Dannielle Burn, RN Date/Time Eilene Ghazi Time): 02/16/2021 11:39:45 AM called back line and gave report that pt had rapid heart rate earlier but not now. BP 142/71 and pulse 90. No SOB or chest pain. Triage outcome see physician within 4 hrs. warm transferred pt to the office Referrals REFERRED TO PCP OFFIC
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
## 2021-02-16 NOTE — ED Notes (Signed)
E-signature pad unavailable - Pt verbalized understanding of D/C information - no additional concerns at this time.  

## 2021-02-16 NOTE — Telephone Encounter (Signed)
See note below access nurse with Dr Josefine Class comments.

## 2021-02-16 NOTE — Telephone Encounter (Signed)
Vera RN with Access nurse called and pt had fast heart beat earlier this morning; no fast heart beat now. No SOB or CP. BP now is 142/71 P 90. Access disposition was to be seen in 4 hours. Pt scheduled in office appt with Dr Damita Dunnings 02/16/21 at 3 PM at [facility] . UC & ED precautions given and pt voiced understanding. No covid symptoms at this time. Pt wanted to know if could do rectal bleed appt scheduled for 02/18/21 with Dr Damita Dunnings today. I advised pt primary reason for being seen today is fast heart beat and cannot say there will be time to ck hospital FU for rectal bleeding. Pt will ask at appt today but did not cancel appt 02/18/21 appt. Sending note to Dr Joanne Gavel CMA and will teams Janett Billow also.when get access note will attach to this note.

## 2021-02-16 NOTE — Telephone Encounter (Signed)
I think it makes sense to keep both appointments as scheduled and we can check her today, esp if she isn't having elevated heart rate now.  Thanks.

## 2021-02-17 ENCOUNTER — Telehealth: Payer: Self-pay

## 2021-02-17 MED ORDER — MOLNUPIRAVIR EUA 200MG CAPSULE: 4.0000 | ORAL_CAPSULE | Freq: Two times a day (BID) | ORAL | 0 refills | Status: DC

## 2021-02-17 MED ORDER — MOLNUPIRAVIR EUA 200MG CAPSULE: 4.0000 | ORAL_CAPSULE | Freq: Two times a day (BID) | ORAL | 0 refills | Status: AC

## 2021-02-17 NOTE — Progress Notes (Signed)
I have cancelled the Paxlovid RX with [facility]. [facility] will fill Molnupiravir EUA 200 mg and deliver to the patient.   [name], CPP notified  [name], [place] Clinical Pharmacy Assistant [phone]

## 2021-02-17 NOTE — Telephone Encounter (Signed)
Patient tested positive for COVID at ED yesterday and was prescribed Paxlovid. [facility] attempted to reach ER physician as they are not comfortable filling since patient is on Eliquis 5 mg BID. Category D drug interaction due to increased levels of Eliquis (Inhibitors of CYP3A4 (Strong) and P-glycoprotein may increase the serum concentration of Apixaban), recommend change therapy or reduce Eliquis dose by 50%. Would you like patient to take Paxlovid and reduce Eliquis to 2.5 mg BID for duration OR switch patient to molnupiravir (no drug interaction). If so, a new rx will be needed.  [name], PharmD Clinical Pharmacist Practitioner [facility] [phone]

## 2021-02-17 NOTE — Progress Notes (Addendum)
    Chronic Care Management Pharmacy Assistant   Name: [name]  MRN: [id number] DOB: 05-27-1935  Reason for Encounter: CCM (Paxlovid 300 mg)   [facility] does not have Paxlovid in stock. [facility] has it in stock (a few). They also can deliver to her address. [facility] - [address] [place], [place] [address] - [phone] Fax: [phone].  Called patient to inform her that the delivery of Paxlovid would be coming from [place] today. Tried calling patient on both phone numbers; one was no answer, but no voicemail and the other was out of service. I will try sending a message to the mobile number.   [name], CPP notified  [name], [place] Clinical Pharmacy Assistant [phone]  Time Spent: 21 Minutes

## 2021-02-17 NOTE — Telephone Encounter (Signed)
Sending message to [name] and [name]. [name] is out of the office today.

## 2021-02-17 NOTE — Telephone Encounter (Signed)
I thank all involved. Agree.

## 2021-02-17 NOTE — Progress Notes (Signed)
Returned patient call; informed her [facility] will deliver her Paxlovid today. Informed patient to answer her phone if [facility] calls. Patient understood.   [name], CPP notified  [name], [place] Clinical Pharmacy Assistant [phone]  Time Spent:  5 Minutes

## 2021-02-17 NOTE — Telephone Encounter (Signed)
Given significant drug interactions to paxlovid (alprazolam, atorvastatin, diltiazem, eliquis, fluticasone x2) will change Rx to molnupiravir. New Rx sent to pharmacy.  I initially sent to [facility] - can we call and cancel this? [facility].   Lab Results  Component Value Date   ALT 18 02/10/2021   AST 23 02/10/2021   ALKPHOS 86 02/10/2021   BILITOT 0.8 02/10/2021    Lab Results  Component Value Date   CREATININE 0.92 02/16/2021   BUN 16 02/16/2021   NA 134 (L) 02/16/2021   K 4.3 02/16/2021   CL 101 02/16/2021   CO2 23 02/16/2021  Noted recent treatment for diverticulitis with cipro/flagyl.

## 2021-02-18 ENCOUNTER — Other Ambulatory Visit: Payer: Self-pay

## 2021-02-18 ENCOUNTER — Inpatient Hospital Stay: Payer: Medicare HMO | Admitting: Family Medicine

## 2021-02-18 ENCOUNTER — Emergency Department
Admission: EM | Admit: 2021-02-18 | Discharge: 2021-02-18 | Disposition: A | Discharge status: Home / Self Care | Payer: Medicare HMO | Attending: Emergency Medicine | Admitting: Emergency Medicine

## 2021-02-18 ENCOUNTER — Telehealth: Payer: Self-pay

## 2021-02-18 DIAGNOSIS — I2510 Atherosclerotic heart disease of native coronary artery without angina pectoris: Secondary | ICD-10-CM | POA: Insufficient documentation

## 2021-02-18 DIAGNOSIS — K921 Melena: Secondary | ICD-10-CM | POA: Insufficient documentation

## 2021-02-18 DIAGNOSIS — Z7951 Long term (current) use of inhaled steroids: Secondary | ICD-10-CM | POA: Diagnosis not present

## 2021-02-18 DIAGNOSIS — U071 COVID-19: Secondary | ICD-10-CM | POA: Diagnosis not present

## 2021-02-18 DIAGNOSIS — J45909 Unspecified asthma, uncomplicated: Secondary | ICD-10-CM | POA: Diagnosis not present

## 2021-02-18 DIAGNOSIS — R58 Hemorrhage, not elsewhere classified: Secondary | ICD-10-CM | POA: Diagnosis not present

## 2021-02-18 DIAGNOSIS — R1084 Generalized abdominal pain: Secondary | ICD-10-CM | POA: Insufficient documentation

## 2021-02-18 DIAGNOSIS — Z7901 Long term (current) use of anticoagulants: Secondary | ICD-10-CM | POA: Diagnosis not present

## 2021-02-18 DIAGNOSIS — Z79899 Other long term (current) drug therapy: Secondary | ICD-10-CM | POA: Diagnosis not present

## 2021-02-18 DIAGNOSIS — I119 Hypertensive heart disease without heart failure: Secondary | ICD-10-CM | POA: Diagnosis not present

## 2021-02-18 DIAGNOSIS — Z96653 Presence of artificial knee joint, bilateral: Secondary | ICD-10-CM | POA: Insufficient documentation

## 2021-02-18 DIAGNOSIS — K922 Gastrointestinal hemorrhage, unspecified: Secondary | ICD-10-CM | POA: Diagnosis not present

## 2021-02-18 LAB — COMPREHENSIVE METABOLIC PANEL
ALT: 18 U/L (ref 0–44)
AST: 24 U/L (ref 15–41)
Albumin: 3.7 g/dL (ref 3.5–5.0)
Alkaline Phosphatase: 78 U/L (ref 38–126)
Anion gap: 12 (ref 5–15)
BUN: 15 mg/dL (ref 8–23)
CO2: 21 mmol/L — ABNORMAL LOW (ref 22–32)
Calcium: 8.8 mg/dL — ABNORMAL LOW (ref 8.9–10.3)
Chloride: 99 mmol/L (ref 98–111)
Creatinine, Ser: 0.94 mg/dL (ref 0.44–1.00)
GFR, Estimated: >60 mL/min (ref >60)
Glucose, Bld: 84 mg/dL (ref 70–99)
Potassium: 3.8 mmol/L (ref 3.5–5.1)
Sodium: 132 mmol/L — ABNORMAL LOW (ref 135–145)
Total Bilirubin: 1.2 mg/dL (ref 0.3–1.2)
Total Protein: 7.5 g/dL (ref 6.5–8.1)

## 2021-02-18 LAB — CBC
HCT: 42.6 % (ref 36.0–46.0)
Hemoglobin: 13.7 g/dL (ref 12.0–15.0)
MCH: 28.5 pg (ref 26.0–34.0)
MCHC: 32.2 g/dL (ref 30.0–36.0)
MCV: 88.8 fL (ref 80.0–100.0)
Platelets: 192 10*3/uL (ref 150–400)
RBC: 4.8 MIL/uL (ref 3.87–5.11)
RDW: 14.9 % (ref 11.5–15.5)
WBC: 6.6 10*3/uL (ref 4.0–10.5)
nRBC: 0 % (ref 0.0–0.2)

## 2021-02-18 LAB — SAMPLE TO BLOOD BANK

## 2021-02-18 NOTE — ED Notes (Signed)
Pt given applesauce, orange juice and crackers.

## 2021-02-18 NOTE — Progress Notes (Addendum)
Chronic Care Management Pharmacy Assistant   Name: [name]  MRN: [id number] DOB: 12-08-1935  Reason for Encounter: CCM (Medication Adherence and Delivery Coordination)   Recent office visits:  01/29/2021 - Jacqueline Stain, MD - Patient presented for Influenza vaccine. Immunizations given: Fluad Quad (High dose 65+). Advised: If BP is above 160/90, then take an extra tab of lisinopril.  Max 2 doses of lisinopril in a day. Stopped due to patient not taking: isosorbide mononitrate (IMDUR) 30 MG 24 hr tablet and metoprolol tartrate (LOPRESSOR) 25 MG tablet.  Recent consult visits:  None since last CCM contact  Hospital visits:  Admitted to the hospital on 02/16/2021 due to sore throat and dizziness.Marland Kitchen Discharge date was 02/17/2021. Discharged from [facility].    Findings: Patient tested positive for Covid-19.  New?Medications Started at [facility] Discharge:?? -started nirmatrelvir/ritonavir EUA (PAXLOVID) 20 x 150 MG & 10 x 100MG  TABS - Medication has since been changed by [name]. Start: Molnupiravir 200 mg capsules.   Medication Changes at Hospital Discharge: No changes noted  Medications Discontinued at Hospital Discharge: None noted  Medications that remain the same after Hospital Discharge:??  -All other medications will remain the same  Medication Reconciliation was completed by comparing discharge summary, patient's EMR and Pharmacy list, and upon discussion with patient.  Admitted to the hospital on 02/10/2021 due to complaint of crampy lower abdominal pain since last night along with passing blood clots. Discharge date was 02/12/2021. Discharged from [facility].    Findings: sigmoid diverticulitis  New?Medications Started at [facility] Discharge:?? -started ciprofloxacin (CIPRO) 500 MG tablet -started metroNIDAZOLE (FLAGYL) 500 MG tablet  Medication Changes at Hospital Discharge: No changed noted  Medications Discontinued at  Hospital Discharge: None noted  Medications that remain the same after Hospital Discharge:??  -All other medications will remain the same.    Medication Reconciliation was completed by comparing discharge summary, patient's EMR and Pharmacy list, and upon discussion with patient.  Medications: Outpatient Encounter Medications as of 02/18/2021  Medication Sig   albuterol (VENTOLIN HFA) 108 (90 Base) MCG/ACT inhaler Inhale 2 puffs by mouth every 6 hours as needed for wheezing/shortness of breath   ALPRAZolam (XANAX) 0.5 MG tablet TAKE ONE TABLET BY MOUTH daily AS NEEDED FOR ANXIETY (Patient taking differently: Take 0.5 mg by mouth daily as needed for anxiety. TAKE ONE TABLET BY MOUTH daily AS NEEDED FOR ANXIETY)   atorvastatin (LIPITOR) 80 MG tablet TAKE 1 TABLET BY MOUTH  DAILY (Patient taking differently: Take 80 mg by mouth daily.)   Cholecalciferol (VITAMIN D) 2000 units CAPS Take 2,000 Units by mouth daily.   diltiazem (CARDIZEM CD) 180 MG 24 hr capsule Take 1 capsule (180 mg total) by mouth daily.   ELIQUIS 5 MG TABS tablet TAKE ONE TABLET BY MOUTH TWICE DAILY (Patient taking differently: Take 5 mg by mouth 2 (two) times daily.)   fluticasone (FLONASE) 50 MCG/ACT nasal spray USE TWO SPRAYS in each nostril ONCE DAILY (Patient taking differently: Place 2 sprays into both nostrils daily.)   fluticasone (FLOVENT HFA) 110 MCG/ACT inhaler Inhale 2 puffs into the lungs in the morning and at bedtime. Rinse after use   lansoprazole (PREVACID) 30 MG capsule Take 1 capsule (30 mg total) by mouth 2 (two) times daily.   lisinopril (ZESTRIL) 5 MG tablet Take 1 tablet (5 mg total) by mouth daily. If BP is above 160/90, then take an extra tab of lisinopril.  Max 2 doses of lisinopril in a day.  molnupiravir EUA (LAGEVRIO) 200 mg CAPS capsule Take 4 capsules (800 mg total) by mouth 2 (two) times daily for 5 days.   sertraline (ZOLOFT) 100 MG tablet Take one and one-half tablets by mouth once daily (Patient  taking differently: Take 150 mg by mouth daily. Take one and one-half tablets by mouth once daily)   vitamin B-12 (CYANOCOBALAMIN) 1000 MCG tablet Take 1,000 mcg by mouth daily.   No facility-administered encounter medications on file as of 02/18/2021.   BP Readings from Last 3 Encounters:  02/16/21 128/77  02/12/21 136/71  01/29/21 132/78    Lab Results  Component Value Date   HGBA1C 5.9 10/17/2011    Recent OV, Consult or Hospital visit:  Recent medication changes indicated:  Start: Molnupiravir 200 mg capsules due to Covid-19 and Lisinopril 5mg   take 1 tablet daily - may take extra dose if BP over 160/90, PRN in vial  Last adherence delivery date: 01/29/2021  Patient is due for next adherence delivery on: 03/02/2021  Spoke with patient on 02/19/2021 reviewed medications and coordinated delivery.  This delivery to include: Adherence Packaging  30 Days  Packs: Patient denied all packaged medication at this time.    VIAL medications: Alprazolam 0.5mg  take 1 tablet daily for anxiety (PRN) Patient would like to take Alprazolam 0.5 mg twice daily - Patient would like to take 1 tablet at breakfast and 1 tablet at evening meal. This will need to be approved by [name]. This is the only medication patient would like delivered this month. Patient denied all other medications. Sent note to [name] regarding pt request.  Patient denied the following medications:  Eliquis 5mg  take 1 tablet 2 times daily breakfast and evening meal Diltiazem 180mg  24 hr capsule 1 capsule by mouth daily breakfast  Atorvastatin 80mg  Tablet 1 tablet by mouth daily breakfast Lisinopril 5mg   take 1 tablet daily - may take extra dose if BP over 160/90, PRN in vial Vitamin B-12 1060mcg Take 1,000 by mouth daily breakfast Vitamin D3 76mcg  take 1 tablet daily breakfast Sertraline 100mg   take 1 1/2 tablets daily breakfast, evening meal  Lansoprazole 30mg  Take 1 tablet at breakfast and 1 tablet evening  mea Fluticasone propionate 110/mcg 2 puffs in the morning and bedtime Albuterol HFA 180 inhaler 2 puffs daily as needed   Medications not due: Isosorbide Mononitrate 30mg  24 hr tablet Take 1 tablet by mouth daily breakfast  (ON HOLD - 12/08/20)  Any concerns about your medications? Yes, patient does not want to pay for medications she does not need. Patient denied all medications except for Alprazolam as she has plenty.  How often do you forget or accidentally miss a dose? It appears in talking with the patient she has not been taking her medications for some time now.   Do you use a pillbox? No  Is patient in packaging Yes  If yes  What is the date on your next pill pack? Patient could not tell me. She had some still remaining in the box that started on 02/19/2021, but had a lot out of the box. Patient declined counting them.   Any concerns or issues with your packaging? No  No refill request needed.  Confirmed delivery date of 02/22/2021, advised patient that pharmacy will contact them the morning of delivery.   Annual wellness visit in last year? No - 2018 Most Recent BP reading: 128/77 on 02/16/2021  [name], CPP notified  [name], [place] Assistant [phone]  I  have reviewed the care management and care coordination activities outlined in this encounter and I am certifying that I agree with the content of this note. We have contacted patient multiple times to advise against non-adherence. Her social situation has been reported to local services. We let her know we are here if she needs [place]. No further action required.  [name], PharmD Clinical Pharmacist [facility] [phone]

## 2021-02-18 NOTE — ED Triage Notes (Signed)
Pt arrives via ems from home, pt was seen on Tuesday and told that she could start taking her medication again, pt states her bp was messing up, pt states that she had a dark stool last pm and this am, pt is only c/o buttock discomfort when she moves a certain way
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
## 2021-02-18 NOTE — ED Notes (Signed)
[name], grandson called at [phone] and informed that patient is ready for discharge. Tyrone states he will come to get her.
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
## 2021-02-18 NOTE — Discharge Instructions (Addendum)
Please hold you eliquis until follow-up with your PCP

## 2021-02-18 NOTE — ED Notes (Signed)
Type and screen done in triage as wekk as a blue top, not ordered at this time
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
## 2021-02-18 NOTE — ED Notes (Signed)
Pt states that her stool has been dark and tarry that started today and came to the ED. Pt was recently diagnosed with diverticulitis.

## 2021-02-18 NOTE — ED Notes (Signed)
Patient with DC orders. Waiting on grandson to come pick her up. Patient states he gets off work at 1700 but occasionally has to work overtime. [place] provided to patient per request.

## 2021-02-18 NOTE — ED Notes (Signed)
[facility] called at this time due to pt not feeling safe at home. RN left voice message.

## 2021-02-18 NOTE — ED Notes (Signed)
Per Sheriff pt's safe discharge will be home with grandson, [name].
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
## 2021-02-18 NOTE — ED Notes (Signed)
Pt to ED via [facility] from home for dark stools. Pt is in NAD. Pt COVID pos. VSS.

## 2021-02-19 ENCOUNTER — Telehealth: Payer: Self-pay

## 2021-02-19 MED ORDER — ALPRAZOLAM 0.5 MG PO TABS
0.5000 mg | ORAL_TABLET | Freq: Two times a day (BID) | ORAL | 1 refills | Status: DC | PRN
Start: 2021-02-19 — End: 2021-04-25

## 2021-02-19 NOTE — Telephone Encounter (Signed)
lmtcb

## 2021-02-19 NOTE — Telephone Encounter (Signed)
Spoke with patient and advised that rx was sent in. Patient has also had another hospital visit yesterday and I moved her appt up to 02/26/21 from 03/12/21.

## 2021-02-19 NOTE — Addendum Note (Signed)
Addended by: [name] on: 02/19/2021 01:36 PM   Modules accepted: Orders

## 2021-02-19 NOTE — Telephone Encounter (Signed)
-----   Message from [name], [place] sent at 02/19/2021 11:17 AM EST ----- Regarding: Alprazolam Prescription Hi Keshav Winegar, I spoke with Darryl Nestle this morning for her medication delivery. Patient denied all medications except Alprazolam 0.5 mg. Patient stated she wants to start taking the medication two times a day (once at breakfast and once at evening meal) as two a day makes her anxiety better. Her prescription is for 0.5 mg - Take 1 daily as needed for anxiety. Would you mind seeing if [name] approves and will send in a new prescription to [facility]? Thank you so much for your help!

## 2021-02-19 NOTE — Telephone Encounter (Signed)
Should be okay to do, as needed.  I sent the rx.  Verify with patient about sedation caution with the med.  Thanks.

## 2021-02-22 NOTE — Telephone Encounter (Signed)
Pt called in requesting a call back regarding medication concern #[phone]

## 2021-02-22 NOTE — Telephone Encounter (Signed)
Spoke with patient and she wanted to report she is doing well. She has started taking the alprazolam and is sleeping more. She will keep f/u appt on 02/26/21.

## 2021-02-23 ENCOUNTER — Telehealth: Payer: Self-pay | Admitting: Family Medicine

## 2021-02-25 DIAGNOSIS — R6889 Other general symptoms and signs: Secondary | ICD-10-CM | POA: Diagnosis not present

## 2021-02-26 ENCOUNTER — Inpatient Hospital Stay: Payer: Medicare HMO | Admitting: Family Medicine

## 2021-03-02 ENCOUNTER — Telehealth: Payer: Self-pay

## 2021-03-02 NOTE — Progress Notes (Addendum)
Chronic Care Management Pharmacy Assistant   Name: [name]  MRN: [id number] DOB: 03/13/1936  Reason for Encounter: CCM (General Adherence)  Recent office visits:  02/22/2021 - Jacqueline Stain, MD - Telephone - Patient reported sleeping more and doing well with the increase in taking her Alprazolam 0.5 mg twice a day vs. one time a day.   Recent consult visits:  None since last CCM contact  Hospital visits:  02/18/2021 [facility] Emergency Department - Patient presented for Melena. No medication changes. Discharged same day.   Medications: Outpatient Encounter Medications as of 03/02/2021  Medication Sig   albuterol (VENTOLIN HFA) 108 (90 Base) MCG/ACT inhaler Inhale 2 puffs by mouth every 6 hours as needed for wheezing/shortness of breath   ALPRAZolam (XANAX) 0.5 MG tablet Take 1 tablet (0.5 mg total) by mouth 2 (two) times daily as needed for anxiety.   atorvastatin (LIPITOR) 80 MG tablet TAKE 1 TABLET BY MOUTH  DAILY (Patient taking differently: Take 80 mg by mouth daily.)   Cholecalciferol (VITAMIN D) 2000 units CAPS Take 2,000 Units by mouth daily.   diltiazem (CARDIZEM CD) 180 MG 24 hr capsule Take 1 capsule (180 mg total) by mouth daily.   ELIQUIS 5 MG TABS tablet TAKE ONE TABLET BY MOUTH TWICE DAILY (Patient taking differently: Take 5 mg by mouth 2 (two) times daily.)   fluticasone (FLONASE) 50 MCG/ACT nasal spray USE TWO SPRAYS in each nostril ONCE DAILY (Patient taking differently: Place 2 sprays into both nostrils daily.)   fluticasone (FLOVENT HFA) 110 MCG/ACT inhaler Inhale 2 puffs into the lungs in the morning and at bedtime. Rinse after use   lansoprazole (PREVACID) 30 MG capsule Take 1 capsule (30 mg total) by mouth 2 (two) times daily.   lisinopril (ZESTRIL) 5 MG tablet Take 1 tablet (5 mg total) by mouth daily. If BP is above 160/90, then take an extra tab of lisinopril.  Max 2 doses of lisinopril in a day.   sertraline (ZOLOFT) 100 MG tablet Take one  and one-half tablets by mouth once daily (Patient taking differently: Take 150 mg by mouth daily. Take one and one-half tablets by mouth once daily)   vitamin B-12 (CYANOCOBALAMIN) 1000 MCG tablet Take 1,000 mcg by mouth daily.   No facility-administered encounter medications on file as of 03/02/2021.   Contacted [name] on 03/02/2021 for general disease state and medication adherence call.   Patient is > 5 days past due for refill on the following medications per chart history:  [facility] Medications: Medication Name/mg Last Fill Days Supply Lisinopril 5mg                01/29/2021      30 Atorvastatin 80mg         01/25/2021 30          Spoke with patient. She states she is feeling better than she has in a long time. Patient stated she is taking her medication, but using the medication packs she has from before. Patient stated the packs are from 02/09/2021 - 02/14/2021. Patient informed me she will take all packs and medication to her appointment on 03/04/2021 with [name]. She stated she is taking them so she can figure out where to go from here. She states she is not having any more black stools and feels great from when she went to the ED for on 02/18/2021. Patient sounded very upbeat and happy; patient sounded the best I have heard her sound since  I have started speaking with her.  Care Gaps: Annual wellness visit in last year? No Most Recent BP reading: 134/75 on 02/18/2021  PCP appointment on 12/22 for hospital follow up.  [name], CPP notified  [name], [place] Clinical Pharmacy Assistant [phone]  I have reviewed the care management and care coordination activities outlined in this encounter and I am certifying that I agree with the content of this note. No further action required.  [name], PharmD Clinical Pharmacist [place] [facility] at [facility] [phone]

## 2021-03-03 DIAGNOSIS — H353122 Nonexudative age-related macular degeneration, left eye, intermediate dry stage: Secondary | ICD-10-CM | POA: Diagnosis not present

## 2021-03-03 DIAGNOSIS — H353211 Exudative age-related macular degeneration, right eye, with active choroidal neovascularization: Secondary | ICD-10-CM | POA: Diagnosis not present

## 2021-03-03 DIAGNOSIS — R6889 Other general symptoms and signs: Secondary | ICD-10-CM | POA: Diagnosis not present

## 2021-03-04 ENCOUNTER — Telehealth: Payer: Self-pay

## 2021-03-04 ENCOUNTER — Other Ambulatory Visit: Payer: Self-pay

## 2021-03-04 ENCOUNTER — Ambulatory Visit (INDEPENDENT_AMBULATORY_CARE_PROVIDER_SITE_OTHER): Payer: Medicare HMO | Admitting: Family Medicine

## 2021-03-04 ENCOUNTER — Encounter: Payer: Self-pay | Admitting: Family Medicine

## 2021-03-04 VITALS — BP 112/62 | HR 91 | Temp 98.3°F | Ht 66.0 in | Wt 198.0 lb

## 2021-03-04 DIAGNOSIS — R6889 Other general symptoms and signs: Secondary | ICD-10-CM | POA: Diagnosis not present

## 2021-03-04 DIAGNOSIS — K625 Hemorrhage of anus and rectum: Secondary | ICD-10-CM

## 2021-03-04 DIAGNOSIS — I10 Essential (primary) hypertension: Secondary | ICD-10-CM

## 2021-03-04 LAB — CBC WITH DIFFERENTIAL/PLATELET
Basophils Absolute: 0.1 10*3/uL (ref 0.0–0.1)
Basophils Relative: 0.9 % (ref 0.0–3.0)
Eosinophils Absolute: 0.1 10*3/uL (ref 0.0–0.7)
Eosinophils Relative: 1.2 % (ref 0.0–5.0)
HCT: 35.7 % — ABNORMAL LOW (ref 36.0–46.0)
Hemoglobin: 11.6 g/dL — ABNORMAL LOW (ref 12.0–15.0)
Lymphocytes Relative: 25 % (ref 12.0–46.0)
Lymphs Abs: 1.7 10*3/uL (ref 0.7–4.0)
MCHC: 32.5 g/dL (ref 30.0–36.0)
MCV: 87.8 fl (ref 78.0–100.0)
Monocytes Absolute: 0.6 10*3/uL (ref 0.1–1.0)
Monocytes Relative: 9.1 % (ref 3.0–12.0)
Neutro Abs: 4.2 10*3/uL (ref 1.4–7.7)
Neutrophils Relative %: 63.8 % (ref 43.0–77.0)
Platelets: 177 10*3/uL (ref 150.0–400.0)
RBC: 4.07 Mil/uL (ref 3.87–5.11)
RDW: 15.5 % (ref 11.5–15.5)
WBC: 6.6 10*3/uL (ref 4.0–10.5)

## 2021-03-04 LAB — BASIC METABOLIC PANEL
BUN: 10 mg/dL (ref 6–23)
CO2: 28 mEq/L (ref 19–32)
Calcium: 8.8 mg/dL (ref 8.4–10.5)
Chloride: 104 mEq/L (ref 96–112)
Creatinine, Ser: 0.81 mg/dL (ref 0.40–1.20)
GFR: 67.25 mL/min (ref >60.00)
Glucose, Bld: 91 mg/dL (ref 70–99)
Potassium: 3.5 mEq/L (ref 3.5–5.1)
Sodium: 138 mEq/L (ref 135–145)

## 2021-03-04 NOTE — Patient Instructions (Addendum)
Go to the lab on the way out.   If you have mychart we'll likely use that to update you.    Take care.  Glad to see you. 

## 2021-03-04 NOTE — Progress Notes (Signed)
This visit occurred during the SARS-CoV-2 public health emergency.  Safety protocols were in place, including screening questions prior to the visit, additional usage of staff PPE, and extensive cleaning of exam room while observing appropriate contact time as indicated for disinfecting solutions.  Walking better with quad cane.  She slid off a chair recently, accidentally, no LOC.  L shoulder sore in the meantime but normal ROM and discomfort are getting better.  Fall cautions discussed with patient.  She had talked to adult protective services about her family situation.  She will update me as needed.  ER eval in the meantime, was passing blood per rectum.  In the meantime, no more BRBPR.  Prev colonoscopy with diverticulosis in the sigmoid colon.  Non-bleeding internal hemorrhoids. She had mult polyps removed 2022.  She had been using vaseline on hemorrhoids after BMs with relief of sx.    Recent blood pressure checks have been reasonable either controlled or only minimally elevated on home check.  We clarified all of her medications on her med list today.  Meds, vitals, and allergies reviewed.   ROS: Per HPI unless specifically indicated in ROS section   GEN: nad, alert and oriented HEENT: ncat, R subconjunctival hemorrhage noted.   NECK: supple w/o LA CV: rrr.  PULM: ctab, no inc wob ABD: soft, +bs EXT: no edema SKIN: no acute rash Walking with quad cane with symmetric gait.    35 minutes were devoted to patient care in this encounter (this includes time spent reviewing the patient's file/history, interviewing and examining the patient, counseling/reviewing plan with patient).

## 2021-03-04 NOTE — Telephone Encounter (Signed)
When patient was in the lab she told lab tech that she forgot to tell [name] that her hemorrhoids are flaring up again. She would like something for this if possible or advised what to do when we call her with her lab results.

## 2021-03-05 MED ORDER — HYDROCORTISONE ACETATE 25 MG RE SUPP: 25.0000 mg | Freq: Two times a day (BID) | RECTAL | 0 refills | Status: DC | PRN

## 2021-03-05 NOTE — Telephone Encounter (Signed)
She could use anusol suppositories prn.  Rx sent.  Thanks.

## 2021-03-05 NOTE — Telephone Encounter (Signed)
Patient aware rx was sent.

## 2021-03-05 NOTE — Addendum Note (Signed)
Addended by: [name] on: 03/05/2021 07:26 AM   Modules accepted: Orders

## 2021-03-10 NOTE — Assessment & Plan Note (Signed)
Blood pressure has been reasonable.  Would continue diltiazem lisinopril as is.

## 2021-03-10 NOTE — Assessment & Plan Note (Signed)
Likely from either hemorrhoids or diverticulosis.  She not passed any more blood at home.  Discussed options.  Benign abdominal exam.  Recheck labs today.  See notes on labs.  Okay to treat her hemorrhoids symptomatically.

## 2021-03-12 ENCOUNTER — Inpatient Hospital Stay: Payer: Medicare HMO | Admitting: Family Medicine

## 2021-03-16 ENCOUNTER — Telehealth: Payer: Self-pay

## 2021-03-16 NOTE — Telephone Encounter (Signed)
[place] Night - Client Nonclinical Telephone Record  [facility]urse Client [place] Night - Client Client Site [facility] - Night Provider Renford Dills - MD Contact Type Call Who Is Calling Patient / Member / Family / Caregiver Caller Name [place] Phone Number [phone] Patient Name [name] Patient DOB [birth year] Call Type Message Only Information Provided Reason for Call Request for General Office Information Initial Comment caller states that she is out of her blood pressure medication since Sunday morning. Additional Comment confirmed office hours Disp. Time Disposition Final User 03/15/2021 1:24:29 PM General Information Provided Yes Serita Grit Call Closed By: Serita Grit Transaction Date/Time: 03/15/2021 1:20:08 PM (ET

## 2021-03-17 ENCOUNTER — Telehealth: Payer: Self-pay

## 2021-03-17 DIAGNOSIS — L03115 Cellulitis of right lower limb: Secondary | ICD-10-CM | POA: Diagnosis not present

## 2021-03-17 MED ORDER — LISINOPRIL 5 MG PO TABS: 5.0000 mg | ORAL_TABLET | Freq: Every day | ORAL | 1 refills | Status: DC

## 2021-03-17 NOTE — Telephone Encounter (Signed)
Please check on patient tomorrow.  Thanks.

## 2021-03-17 NOTE — Telephone Encounter (Signed)
Have tried multiple contact #s under the contact section of this note with no answer and I  did leave v/m per DPR. Per access nurse note pt did agree to comply with disposition of going to UC or ED in 4 hour period. Sending note to Dr Damita Dunnings who is out of office, Dr Darnell Level who was in office today and Janett Billow CMA.

## 2021-03-17 NOTE — Telephone Encounter (Signed)
[place] Day - Client TELEPHONE ADVICE RECORD [facility] Patient Name: [facility] BRE EZE Gender: Female DOB: 1935-08-19 Age: 86 Y 2 M 9 D Return Phone Number: [phone] (Primary) Address: City/ State/ Zip: [place] [address] Client [place] Day - Client Client Site [place] - Day Provider Renford Dills - MD Contact Type Call Who Is Calling Patient / Member / Family / Caregiver Call Type Triage / Clinical Relationship To Patient Self Return Phone Number [phone] (Primary) Chief Complaint Foot Pain Reason for Call Symptomatic / Request for Health Information Initial Comment Caller states she has pain in her right foot ant its swollen and a knot(blue) color, and might have a cut on her pinky toe. Translation No Nurse Assessment Nurse: Files, RN, Dustin Date/Time (Eastern Time): 03/17/2021 4:04:24 PM Confirm and document reason for call. If symptomatic, describe symptoms. ---Patient says she doesn't remember injuring it, soaked it in water Saturday night, been bothering her all week, right across her three toes is purple and swollen Does the patient have any new or worsening symptoms? ---Yes Will a triage be completed? ---Yes Related visit to physician within the last 2 weeks? ---Yes Does the PT have any chronic conditions? (i.e. diabetes, asthma, this includes High risk factors for pregnancy, etc.) ---No Is this a behavioral health or substance abuse call? ---No Guidelines Guideline Title Affirmed Question Affirmed Notes Nurse Date/Time Eilene Ghazi Time) Leg Swelling and Edema SEVERE leg swelling (e.g., swelling extends above knee, entire leg is swollen, weeping fluid) Files, RN, Dustin 03/17/2021 4:06:30 PM Disp. Time Eilene Ghazi Time) Disposition Final User 03/17/2021 4:09:12 PM See HCP within 4 Hours (or PCP triage) Yes Files, RN, Rachel Bo PLEASE NOTE: All timestamps contained within this  report are represented as [place] Standard Time. CONFIDENTIALTY NOTICE: This fax transmission is intended only for the addressee. It contains information that is legally privileged, confidential or otherwise protected from use or disclosure. If you are not the intended recipient, you are strictly prohibited from reviewing, disclosing, copying using or disseminating any of this information or taking any action in reliance on or regarding this information. If you have received this fax in error, please notify us immediately by telephone so that we can arrange for its return to [place]. Phone: [phone], Toll-Free: [phone], Fax: [phone] Page: 2 of 2 Call Id: [id number] Keith Disagree/Comply Comply Caller Understands Yes PreDisposition InappropriateToAsk Care Advice Given Per Guideline SEE HCP (OR PCP TRIAGE) WITHIN 4 HOURS: CALL BACK IF: * You become worse CARE ADVICE given per Leg Swelling and Edema (Adult) guideline. * IF OFFICE WILL BE CLOSED AND NO PCP (PRIMARY CARE PROVIDER) SECOND-LEVEL TRIAGE: You need to be seen within the next 3 or 4 hours. A nearby Urgent Care Center [facility] [place] is often a good source of care. Another choice is to go to the ED. Go sooner if you become worse. Referrals GO TO FACILITY UNDECIDE

## 2021-03-17 NOTE — Addendum Note (Signed)
Addended by: [name] B on: 03/17/2021 10:12 AM   Modules accepted: Orders

## 2021-03-17 NOTE — Telephone Encounter (Signed)
Rx sent and patient is aware. BP has still been okay as of this am. Yesterday BP was 119/66 and pulse was 64.

## 2021-03-18 ENCOUNTER — Telehealth: Payer: Self-pay

## 2021-03-18 NOTE — Progress Notes (Addendum)
Chronic Care Management Pharmacy Assistant   Name: [name]  MRN: [id number] DOB: Jun 21, 1935  Reason for Encounter: CCM (Medication Adherence and Delivery Coordination)   Recent office visits:  03/17/2021 - Ozzie Hoyle, LPN - Telephone - Patient reported she went to [facility] on 03/16/2021 due to foot pain. Patient advised to soak in Epson Salts and take Tylenol PRN for pain. Encounter is not listed in chart as of day of Medication Adherence encounter. Information from [facility] encounter note.  03/16/2021 - Ozzie Hoyle, LPN - Telephone - Patient called in requested blood pressure medication. Refill of lisinopril (ZESTRIL) 5 MG tablets sent to [facility].  03/04/2021 - [name], MD - Patient presented for hospital follow up for bright red blood per rectum. Findings likely from Hemorrhoids or Diverticulosis. Labs: CBC and BMP. Start: hydrocortisone (ANUSOL-HC) 25 MG suppository for Hemorrhoid flare ups.   Recent consult visits:  None since last CCM contact  Hospital visits:  None since last CCM contact  Medications: Outpatient Encounter Medications as of 03/18/2021  Medication Sig   albuterol (VENTOLIN HFA) 108 (90 Base) MCG/ACT inhaler Inhale 2 puffs by mouth every 6 hours as needed for wheezing/shortness of breath   ALPRAZolam (XANAX) 0.5 MG tablet Take 1 tablet (0.5 mg total) by mouth 2 (two) times daily as needed for anxiety.   atorvastatin (LIPITOR) 80 MG tablet TAKE 1 TABLET BY MOUTH  DAILY (Patient taking differently: Take 80 mg by mouth daily.)   Cholecalciferol (VITAMIN D) 2000 units CAPS Take 2,000 Units by mouth daily.   diltiazem (CARDIZEM CD) 180 MG 24 hr capsule Take 1 capsule (180 mg total) by mouth daily.   ELIQUIS 5 MG TABS tablet TAKE ONE TABLET BY MOUTH TWICE DAILY (Patient taking differently: Take 5 mg by mouth 2 (two) times daily.)   fluticasone (FLONASE) 50 MCG/ACT nasal spray USE TWO SPRAYS in each nostril ONCE DAILY  (Patient taking differently: Place 2 sprays into both nostrils daily.)   fluticasone (FLOVENT HFA) 110 MCG/ACT inhaler Inhale 2 puffs into the lungs in the morning and at bedtime. Rinse after use   hydrocortisone (ANUSOL-HC) 25 MG suppository Place 1 suppository (25 mg total) rectally 2 (two) times daily as needed for hemorrhoids or anal itching.   lansoprazole (PREVACID) 30 MG capsule Take 1 capsule (30 mg total) by mouth 2 (two) times daily.   lisinopril (ZESTRIL) 5 MG tablet Take 1 tablet (5 mg total) by mouth daily. If BP is above 160/90, then take an extra tab of lisinopril.  Max 2 doses of lisinopril in a day.   sertraline (ZOLOFT) 100 MG tablet Take one and one-half tablets by mouth once daily (Patient taking differently: Take 150 mg by mouth daily. Take one and one-half tablets by mouth once daily)   vitamin B-12 (CYANOCOBALAMIN) 1000 MCG tablet Take 1,000 mcg by mouth daily.   No facility-administered encounter medications on file as of 03/18/2021.   BP Readings from Last 3 Encounters:  03/04/21 112/62  02/18/21 134/75  02/16/21 128/77    Lab Results  Component Value Date   HGBA1C 5.9 10/17/2011    Recent OV, Consult or Hospital visit:  No medication changes indicated  Last adherence delivery date: Patient has declined medications except Alprazolam (which is not due until 03/29/2021) for the past two months. Patient has her last medication fill with [facility] on 01/13/2021.   Patient is due for next adherence delivery on: 03/31/2021  Spoke with patient on  03/18/2021 reviewed medications and coordinated delivery.  This delivery to include: Adherence Packaging  30 Days  Packs: Eliquis 5mg  take 1 tablet 2 times daily breakfast and evening meal Diltiazem 180mg  24 hr capsule 1 capsule by mouth daily breakfast  Atorvastatin 80mg  Tablet 1 tablet by mouth daily breakfast Lisinopril 5mg   take 1 tablet daily - may take extra dose if BP over 160/90, PRN in vial Vitamin B-12  1081mcg Take 1,000 by mouth daily breakfast Vitamin D3 20mcg  take 1 tablet daily breakfast Sertraline 100mg   take 1 1/2 tablets daily breakfast, evening meal  Lansoprazole 30mg  Take 1 tablet at breakfast and 1 tablet evening meal  Patient declined the following medications this month: Fluticasone propionate 110/mcg 2 puffs in the morning and bedtime Albuterol HFA 180 inhaler 2 puffs daily as needed  Alprazolam 0.5 mg Take 1 tab twice daily as needed  Any concerns about your medications? No  How often do you forget or accidentally miss a dose?  Frequently  Do you use a pillbox? No  Is patient in packaging Yes  If yes  What is the date on your next pill pack? Patient does not have any packs remaining  No refill request needed.  Confirmed delivery date of 03/19/2021, advised patient that pharmacy will contact them the morning of delivery.  Recent blood pressure readings are as follows: 01/05 - 132/63 Pulse 56 01/04 - 128/61 Pulse 62 01/03 - 127/63 Pulse 62  Annual wellness visit in last year? No - 2018 Most Recent BP reading: 112/62 on 03/04/2021  [name], CPP notified  [name], [place] Assistant [phone]  I have reviewed the care management and care coordination activities outlined in this encounter and I am certifying that I agree with the content of this note. No further action required.  [name], PharmD Clinical Pharmacist [facility] at [facility] [phone]

## 2021-03-18 NOTE — Telephone Encounter (Signed)
Spoke to patient and she went to [facility] clinic urgent care in [place] yesterday evening, provider instructed pt to soak in Epson salt and take Tylenol PRN pain. She states he sent in ABT for her she is going to get today and start this evening. No further action needed from pt at this time

## 2021-03-19 NOTE — Telephone Encounter (Signed)
Noted. Thanks.  [facility])

## 2021-03-24 ENCOUNTER — Telehealth: Payer: Self-pay | Admitting: *Deleted

## 2021-03-24 ENCOUNTER — Telehealth: Payer: Medicare HMO

## 2021-03-24 NOTE — Telephone Encounter (Signed)
°  Care Management   Follow Up Note   03/24/2021 Name: [name] MRN: [id number] DOB: 08-16-1935   Referred by: [name], MD Reason for referral : No chief complaint on file.   A second unsuccessful telephone outreach was attempted today. The patient was referred to the case management team for assistance with care management and care coordination.   Follow Up Plan: The care management team will reach out to the patient again over the next 10 days.  Eduard Clos MSW, LCSW Licensed Clinical Social Worker [place]

## 2021-03-29 ENCOUNTER — Telehealth: Payer: Self-pay | Admitting: *Deleted

## 2021-03-29 NOTE — Chronic Care Management (AMB) (Signed)
°  Care Management   Note  03/29/2021 Name: [name] MRN: [id number] DOB: [birth year]  [name] is a 86 y.o. year old female who is a primary care patient of [name], MD and is actively engaged with the care management team. I reached out to [name] by phone today to assist with re-scheduling a follow up visit with the Licensed Clinical Social Worker  Follow up plan: Unsuccessful telephone outreach attempt made. A HIPAA compliant phone message was left for the patient providing contact information and requesting a return call.   Julian Hy, [place] [facility] Management  Direct Dial: [phone]

## 2021-03-30 NOTE — Chronic Care Management (AMB) (Signed)
°  Care Management   Note  03/30/2021 Name: [name] MRN: [id number] DOB: 1935-09-15  [name] is a 86 y.o. year old female who is a primary care patient of [name], MD and is actively engaged with the care management team. I reached out to [name] by phone today to assist with re-scheduling a follow up visit with the Licensed Clinical Social Worker  Follow up plan: Telephone appointment with care management team member scheduled for: 04/06/2021  Julian Hy, [place] [place] Management  Direct Dial: [phone]

## 2021-03-31 ENCOUNTER — Telehealth: Payer: Self-pay | Admitting: *Deleted

## 2021-03-31 DIAGNOSIS — I480 Paroxysmal atrial fibrillation: Secondary | ICD-10-CM | POA: Diagnosis not present

## 2021-03-31 DIAGNOSIS — I10 Essential (primary) hypertension: Secondary | ICD-10-CM | POA: Diagnosis not present

## 2021-03-31 DIAGNOSIS — R6889 Other general symptoms and signs: Secondary | ICD-10-CM | POA: Diagnosis not present

## 2021-03-31 DIAGNOSIS — I350 Nonrheumatic aortic (valve) stenosis: Secondary | ICD-10-CM | POA: Diagnosis not present

## 2021-03-31 NOTE — Chronic Care Management (AMB) (Signed)
°  Care Management   Note  03/31/2021 Name: TYRICA AFZAL MRN: [id number] DOB: 1935-07-06  MEEAH TOTINO is a 86 y.o. year old female who is a primary care patient of [name], MD and is actively engaged with the care management team. I reached out to [name] by phone today to assist with re-scheduling a follow up visit with the Licensed Clinical Social Worker  Follow up plan: Unsuccessful telephone outreach attempt made. A HIPAA compliant phone message was left for the patient providing contact information and requesting a return call.   Julian Hy, [place] Management  Direct Dial: [phone]

## 2021-04-05 ENCOUNTER — Ambulatory Visit (INDEPENDENT_AMBULATORY_CARE_PROVIDER_SITE_OTHER): Payer: Medicare HMO | Admitting: *Deleted

## 2021-04-05 ENCOUNTER — Telehealth: Payer: Self-pay

## 2021-04-05 DIAGNOSIS — Z659 Problem related to unspecified psychosocial circumstances: Secondary | ICD-10-CM

## 2021-04-05 DIAGNOSIS — I10 Essential (primary) hypertension: Secondary | ICD-10-CM

## 2021-04-05 DIAGNOSIS — I4891 Unspecified atrial fibrillation: Secondary | ICD-10-CM

## 2021-04-05 NOTE — Progress Notes (Addendum)
Chronic Care Management Pharmacy Assistant   Name: [name]  MRN: [id number] DOB: 16-May-1935  Reason for Encounter: CCM (Medication Adherence and Delivery Coordination)   Recent office visits:  None since last CCM contact  Recent consult visits:  None since last CCM contact  Hospital visits:  None since last CCM contact  Medications: Outpatient Encounter Medications as of 04/05/2021  Medication Sig   albuterol (VENTOLIN HFA) 108 (90 Base) MCG/ACT inhaler Inhale 2 puffs by mouth every 6 hours as needed for wheezing/shortness of breath   ALPRAZolam (XANAX) 0.5 MG tablet Take 1 tablet (0.5 mg total) by mouth 2 (two) times daily as needed for anxiety.   atorvastatin (LIPITOR) 80 MG tablet TAKE 1 TABLET BY MOUTH  DAILY (Patient taking differently: Take 80 mg by mouth daily.)   Cholecalciferol (VITAMIN D) 2000 units CAPS Take 2,000 Units by mouth daily.   diltiazem (CARDIZEM CD) 180 MG 24 hr capsule Take 1 capsule (180 mg total) by mouth daily.   ELIQUIS 5 MG TABS tablet TAKE ONE TABLET BY MOUTH TWICE DAILY (Patient taking differently: Take 5 mg by mouth 2 (two) times daily.)   fluticasone (FLONASE) 50 MCG/ACT nasal spray USE TWO SPRAYS in each nostril ONCE DAILY (Patient taking differently: Place 2 sprays into both nostrils daily.)   fluticasone (FLOVENT HFA) 110 MCG/ACT inhaler Inhale 2 puffs into the lungs in the morning and at bedtime. Rinse after use   hydrocortisone (ANUSOL-HC) 25 MG suppository Place 1 suppository (25 mg total) rectally 2 (two) times daily as needed for hemorrhoids or anal itching.   lansoprazole (PREVACID) 30 MG capsule Take 1 capsule (30 mg total) by mouth 2 (two) times daily.   lisinopril (ZESTRIL) 5 MG tablet Take 1 tablet (5 mg total) by mouth daily. If BP is above 160/90, then take an extra tab of lisinopril.  Max 2 doses of lisinopril in a day.   sertraline (ZOLOFT) 100 MG tablet Take one and one-half tablets by mouth once daily (Patient taking  differently: Take 150 mg by mouth daily. Take one and one-half tablets by mouth once daily)   vitamin B-12 (CYANOCOBALAMIN) 1000 MCG tablet Take 1,000 mcg by mouth daily.   No facility-administered encounter medications on file as of 04/05/2021.   BP Readings from Last 3 Encounters:  03/04/21 112/62  02/18/21 134/75  02/16/21 128/77    Lab Results  Component Value Date   HGBA1C 5.9 10/17/2011    No OVs, Consults, or hospital visits since last care coordination call / Pharmacist visit. No medication changes indicated  Last adherence delivery date: 03/19/2021      Patient is due for next adherence delivery on: 04/15/2021  Spoke with patient on 04/05/2021 reviewed medications and coordinated delivery.  This delivery to include: Adherence Packaging  30 Days  Packs: Atorvastatin 80mg  Tablet 1 tablet by mouth daily breakfast Eliquis 5mg  take 1 tablet 2 times daily breakfast and evening meal Diltiazem 180mg  24 hr capsule 1 capsule by mouth daily breakfast  Vitamin B-12 1020mcg Take 1,000 by mouth daily breakfast Vitamin D3 51mcg  take 1 tablet daily breakfast Sertraline 100mg   take 1 1/2 tablets daily breakfast, evening meal  Lansoprazole 30mg  Take 1 tablet at breakfast and 1 tablet evening meal   Vial Medications:  Lisinopril 5mg   take 1 tablet daily - may take extra dose if BP over 160/90, PRN in vial  Patient declined the following medications this month: Fluticasone propionate 110/mcg 2 puffs in the morning and bedtime  Albuterol HFA 180 inhaler 2 puffs daily as needed  Alprazolam 0.5 mg Take 1 tab twice daily as needed - Not due until 04/26/21 - Patient has 102 tablets - Patient should run out on May 26, 2021 (if she takes two a day).   Any concerns about your medications? No  How often do you forget or accidentally miss a dose? Never  Do you use a pillbox? No  Is patient in packaging Yes  If yes  What is the date on your next pill pack? 04/05/2021  Any concerns or  issues with your packaging? No  No refill request needed.  Confirmed delivery date of 04/15/2021, advised patient that pharmacy will contact them the morning of delivery.   Recent blood pressure readings are as follows: Taken at night before she goes to bed. Date  Blood Pressure Pulse 01/22  134/64   68 01/21  136/50   56 01/20  143/68   58 01/19  122/47   54 01/18  137/66   51 01/17  136/60   56 01/16  130/52   60 01/15  133/54   55 01/14  137/51   53  Annual wellness visit in last year? No Most Recent BP reading: 112/62 on 03/04/2021  [name], CPP notified  [name], [place] Assistant [phone]  I have reviewed the care management and care coordination activities outlined in this encounter and I am certifying that I agree with the content of this note. No further action required.  [name], PharmD Clinical Pharmacist [facility] at [facility] [phone]

## 2021-04-05 NOTE — Chronic Care Management (AMB) (Signed)
Chronic Care Management    Clinical Social Work Note  04/05/2021 Name: [name] MRN: [id number] DOB: [birth year]  [name] is a 86 y.o. year old female who is a primary care patient of Jacqueline Dunnings Elveria Rising, MD. The CCM team was consulted to assist the patient with chronic disease management and/or care coordination needs related to: [employer]  and Financial Difficulties related to limited income .   Engaged with patient by telephone for follow up visit in response to provider referral for social work chronic care management and care coordination services.   Consent to Services:  The patient was given information about Chronic Care Management services, agreed to services, and gave verbal consent prior to initiation of services.  Please see initial visit note for detailed documentation.   Patient agreed to services and consent obtained.   Assessment: Review of patient past medical history, allergies, medications, and health status, including review of relevant consultants reports was performed today as part of a comprehensive evaluation and provision of chronic care management and care coordination services.     SDOH (Social Determinants of Health) assessments and interventions performed:    Advanced Directives Status: Not addressed in this encounter.  CCM Care Plan  Allergies  Allergen Reactions   Amoxicillin Swelling    Face hands   Penicillins Swelling and Other (See Comments)    Has patient had a PCN reaction causing immediate rash, facial/tongue/throat swelling, SOB or lightheadedness with hypotension: Yes Has patient had a PCN reaction causing severe rash involving mucus membranes or skin necrosis: No Has patient had a PCN reaction that required hospitalization No phalosporin use.   Valium [Diazepam] Other (See Comments)    Pt had shakes   Imdur [Isosorbide Nitrate]     Headache at 60mg  dose    Outpatient Encounter Medications as of 04/05/2021  Medication  Sig   albuterol (VENTOLIN HFA) 108 (90 Base) MCG/ACT inhaler Inhale 2 puffs by mouth every 6 hours as needed for wheezing/shortness of breath   ALPRAZolam (XANAX) 0.5 MG tablet Take 1 tablet (0.5 mg total) by mouth 2 (two) times daily as needed for anxiety.   atorvastatin (LIPITOR) 80 MG tablet TAKE 1 TABLET BY MOUTH  DAILY (Patient taking differently: Take 80 mg by mouth daily.)   Cholecalciferol (VITAMIN D) 2000 units CAPS Take 2,000 Units by mouth daily.   diltiazem (CARDIZEM CD) 180 MG 24 hr capsule Take 1 capsule (180 mg total) by mouth daily.   ELIQUIS 5 MG TABS tablet TAKE ONE TABLET BY MOUTH TWICE DAILY (Patient taking differently: Take 5 mg by mouth 2 (two) times daily.)   fluticasone (FLONASE) 50 MCG/ACT nasal spray USE TWO SPRAYS in each nostril ONCE DAILY (Patient taking differently: Place 2 sprays into both nostrils daily.)   fluticasone (FLOVENT HFA) 110 MCG/ACT inhaler Inhale 2 puffs into the lungs in the morning and at bedtime. Rinse after use   hydrocortisone (ANUSOL-HC) 25 MG suppository Place 1 suppository (25 mg total) rectally 2 (two) times daily as needed for hemorrhoids or anal itching.   lansoprazole (PREVACID) 30 MG capsule Take 1 capsule (30 mg total) by mouth 2 (two) times daily.   lisinopril (ZESTRIL) 5 MG tablet Take 1 tablet (5 mg total) by mouth daily. If BP is above 160/90, then take an extra tab of lisinopril.  Max 2 doses of lisinopril in a day.   sertraline (ZOLOFT) 100 MG tablet Take one and one-half tablets by mouth once daily (Patient taking differently: Take 150  mg by mouth daily. Take one and one-half tablets by mouth once daily)   vitamin B-12 (CYANOCOBALAMIN) 1000 MCG tablet Take 1,000 mcg by mouth daily.   No facility-administered encounter medications on file as of 04/05/2021.    Patient Active Problem List   Diagnosis Date Noted   Sigmoid diverticulitis 02/11/2021   BRBPR (bright red blood per rectum) 02/10/2021   Change in stool 01/31/2021   Other  social stressor 09/27/2020   Cecal polyp    Diverticulosis    GI bleed 07/29/2020   Foot callus 12/30/2019   Gout 08/21/2017   Vision loss 07/30/2017   Headache 07/30/2017   Syncope 12/08/2016   Atrial fibrillation ([place]) 12/05/2016   Advance care planning 09/09/2016   Fatigue 01/20/2016   Arthritis 04/13/2015   Rhinitis 02/25/2015   Osteoarthritis of both knees 11/13/2014   Primary osteoarthritis of both knees 11/13/2014   Cough 11/13/2013   Flank pain 11/21/2012   GERD (gastroesophageal reflux disease)    Back pain 05/04/2011   Hand pain 10/27/2010   Osteoporosis 08/23/2010   UNSPECIFIED VITAMIN D DEFICIENCY 09/23/2008   DEPRESSION/ANXIETY 09/23/2008   Obesity (BMI 30.0-34.9) 06/21/2006   CORONARY ARTERY DISEASE, S/P PTCA 06/21/2006   Extrinsic asthma 06/21/2006   Essential hypertension 06/13/1999   Hyperlipidemia 03/15/1999    Conditions to be addressed/monitored: HTN; Financial constraints related to limited income, Lacks knowledge of community resource: concerns for financial exploitation, and    Care Plan : LCSW Plan of Care  Updates made by Jacqueline Peer, LCSW since 04/05/2021 12:00 AM     Problem: Emotional Distress due to financial hardships and concerns   Priority: High  Note:   Current barriers:    Financial constraints related to bills not getting paid- power bill at $700 per pt. Clinical Goals: Patient will work with APS to address needs related to financial concerns Clinical Interventions:  CSW spoke with pt who reports no recent concerns with her money being mis-used since December. She reports her grandson [name] is helping with the managing of and monitoring. She also reports the [employer] money being offered through her insurance benefits is also helpful.  "I am feeling better and breathing better.....haven't had any sweet tea in weeks".  CSW left a message for [name] and await his callback to collaborate further.   CSW will plan to touch base with pt  in the next 6 weeks.  Assessment of needs, barriers , agencies contacted, as well as how impacting  Solution-Focused Strategies * Active listening / Reflection utilized  [place] strategies reviewed Collaborated with Sharyn Lull, [facility]  Review various resources, discussed options and provided patient information about  Department of Social Services ( APS ) Referral to care guide (financial support)  1:1 collaboration with primary care provider regarding development and update of comprehensive plan of care as evidenced by provider attestation and co-signature Inter-disciplinary care team collaboration (see longitudinal plan of care) Patient Goals/Self-Care Activities: Over the next 30 days Collaborate with the community resource care guide work with the DSS/APS  Go to bank to ensure safety of bank accounts Call 911 if your money, credit cards or other personal valuables are being taken without your consent. Consider guardianship to be appointed        Follow Up Plan: Appointment scheduled for SW follow up with client by phone on:       Eduard Clos MSW, [place] Licensed Clinical Social Worker [place]   [phone]

## 2021-04-05 NOTE — Chronic Care Management (AMB) (Signed)
Per Licensed Clinical SW - successful outreach was made for follow up - closing encounter

## 2021-04-05 NOTE — Patient Instructions (Signed)
Visit Information  Thank you for taking time to visit with me today. Please don't hesitate to contact me if I can be of assistance to you before our next scheduled telephone appointment.  Following are the goals we discussed today:  (Copy and paste patient goals from clinical care plan here)  Our next appointment is by telephone on 05/14/21 at 10:30  Please call the care guide team at [phone] if you need to cancel or reschedule your appointment.   If you are experiencing a Mental Health or [place] or need someone to talk to, please go to [facility] [address], [place] [phone]) call 911   The patient verbalized understanding of instructions, educational materials, and care plan provided today and declined offer to receive copy of patient instructions, educational materials, and care plan.   Eduard Clos MSW, LCSW Licensed Clinical Social Worker [place]   [phone]

## 2021-04-06 ENCOUNTER — Telehealth: Payer: Self-pay | Admitting: *Deleted

## 2021-04-06 ENCOUNTER — Telehealth: Payer: Medicare HMO

## 2021-04-06 NOTE — Telephone Encounter (Signed)
Attempted to reach pt's grandson again but no answer- voicemail left.   Eduard Clos MSW, LCSW Licensed Clinical Social Worker [place]   [phone]

## 2021-04-13 DIAGNOSIS — I10 Essential (primary) hypertension: Secondary | ICD-10-CM

## 2021-04-13 DIAGNOSIS — I4891 Unspecified atrial fibrillation: Secondary | ICD-10-CM

## 2021-04-20 ENCOUNTER — Telehealth: Payer: Self-pay | Admitting: Family Medicine

## 2021-04-21 NOTE — Telephone Encounter (Signed)
error 

## 2021-04-22 DIAGNOSIS — H353122 Nonexudative age-related macular degeneration, left eye, intermediate dry stage: Secondary | ICD-10-CM | POA: Diagnosis not present

## 2021-04-22 DIAGNOSIS — H3540 Unspecified peripheral retinal degeneration: Secondary | ICD-10-CM | POA: Diagnosis not present

## 2021-04-22 DIAGNOSIS — H353211 Exudative age-related macular degeneration, right eye, with active choroidal neovascularization: Secondary | ICD-10-CM | POA: Diagnosis not present

## 2021-04-22 DIAGNOSIS — R6889 Other general symptoms and signs: Secondary | ICD-10-CM | POA: Diagnosis not present

## 2021-04-22 DIAGNOSIS — H43813 Vitreous degeneration, bilateral: Secondary | ICD-10-CM | POA: Diagnosis not present

## 2021-04-23 ENCOUNTER — Other Ambulatory Visit: Payer: Self-pay | Admitting: Family Medicine

## 2021-04-23 NOTE — Telephone Encounter (Signed)
Refill request for alprazolam 0.5 mg tablet  LOV - 03/04/21 Next OV - 05/06/21 Last refill - 02/19/21 #60/1

## 2021-04-25 NOTE — Telephone Encounter (Signed)
Sent. Thanks.   

## 2021-05-03 ENCOUNTER — Other Ambulatory Visit: Payer: Self-pay | Admitting: Family Medicine

## 2021-05-04 ENCOUNTER — Telehealth: Payer: Self-pay

## 2021-05-04 NOTE — Progress Notes (Signed)
Chronic Care Management Pharmacy Assistant   Name: [name]  MRN: [id number] DOB: 13-Dec-1935  Reason for Encounter: CCM (Medication Adherence and Delivery Coordination)   Recent office visits:  None since last CCM contact  Recent consult visits:  None since last CCM contact  Hospital visits:  None since last CCM contact  Medications: Outpatient Encounter Medications as of 05/04/2021  Medication Sig   albuterol (VENTOLIN HFA) 108 (90 Base) MCG/ACT inhaler Inhale 2 puffs by mouth every 6 hours as needed for wheezing/shortness of breath   ALPRAZolam (XANAX) 0.5 MG tablet TAKE ONE TABLET BY MOUTH twice daily AS NEEDED FOR ANXIETY   atorvastatin (LIPITOR) 80 MG tablet TAKE 1 TABLET BY MOUTH  DAILY (Patient taking differently: Take 80 mg by mouth daily.)   Cholecalciferol (VITAMIN D) 2000 units CAPS Take 2,000 Units by mouth daily.   diltiazem (CARDIZEM CD) 180 MG 24 hr capsule Take 1 capsule (180 mg total) by mouth daily.   ELIQUIS 5 MG TABS tablet TAKE ONE TABLET BY MOUTH TWICE DAILY (Patient taking differently: Take 5 mg by mouth 2 (two) times daily.)   fluticasone (FLONASE) 50 MCG/ACT nasal spray USE TWO SPRAYS in each nostril ONCE DAILY (Patient taking differently: Place 2 sprays into both nostrils daily.)   fluticasone (FLOVENT HFA) 110 MCG/ACT inhaler Inhale 2 puffs into the lungs in the morning and at bedtime. Rinse after use   hydrocortisone (ANUSOL-HC) 25 MG suppository Place 1 suppository (25 mg total) rectally 2 (two) times daily as needed for hemorrhoids or anal itching.   lansoprazole (PREVACID) 30 MG capsule Take 1 capsule (30 mg total) by mouth 2 (two) times daily.   lisinopril (ZESTRIL) 5 MG tablet Take 1 tablet (5 mg total) by mouth daily. If BP is above 160/90, then take an extra tab of lisinopril.  Max 2 doses of lisinopril in a day.   sertraline (ZOLOFT) 100 MG tablet TAKE 1 AND 1/2 TABLETS BY MOUTH ONCE DAILY   vitamin B-12 (CYANOCOBALAMIN) 1000 MCG tablet  Take 1,000 mcg by mouth daily.   No facility-administered encounter medications on file as of 05/04/2021.   BP Readings from Last 3 Encounters:  03/04/21 112/62  02/18/21 134/75  02/16/21 128/77    Lab Results  Component Value Date   HGBA1C 5.9 10/17/2011    No OVs, Consults, or hospital visits since last care coordination call / Pharmacist visit. No medication changes indicated  Last adherence delivery date: 04/15/2021  Patient is due for next adherence delivery on: 05/17/2021  Spoke with patient on 05/05/2021 and reviewed medications. Patient denied the need for any medications at this time.  This delivery to include: Adherence Packaging  30 Days  Packs: Eliquis 5mg  take 1 tablet 2 times daily breakfast and evening meal Diltiazem 180mg  24 hr capsule 1 capsule by mouth daily breakfast  Atorvastatin 80mg  Tablet 1 tablet by mouth daily breakfast Vitamin B-12 1029mcg Take 1,000 by mouth daily breakfast Lansoprazole 30mg  Take 1 tablet at breakfast and 1 tablet evening meal Sertraline 100mg   take 1 1/2 tablets daily breakfast, evening meal  Vitamin D3 16mcg  take 1 tablet daily breakfast   Vial Medications:  Lisinopril 5mg   take 1 tablet daily - may take extra dose if BP over 160/90, PRN in vial Fluticasone propionate 110/mcg 2 puffs in the morning and bedtime Albuterol HFA 180 inhaler 2 puffs daily as needed   Can not be filled until 05/24/2021: Alprazolam 0.5 mg Take 1 tab twice daily as needed -  Not due until 04/26/21 - Patient has 102 tablets - Patient should run out on May 26, 2021 (if she takes two a day).   Any concerns about your medications? No  How often do you forget or accidentally miss a dose? Rarely  Do you use a pillbox? No  Is patient in packaging Yes  If yes  What is the date on your next pill pack? Patient could not get up to check packs due to pain.   Any concerns or issues with your packaging? No  Refills requested from providers  include: Sertraline 100mg   take 1 1/2 tablets daily breakfast, evening meal   Confirmed delivery date of 05/17/2021, advised patient that pharmacy will contact them the morning of delivery.   Recent blood pressure readings are as follows: Patient could not get up to get a log due to pain.   It was apparent during our conversation that the patient was not feeling well. I asked her about it. Patient states she is in pain. She can tell she is having the same issue as before with her intestines being swollen. She states it hurts to walk or move; when she walks she can feel the fluid in her intestines. States the pain is usually a 5/10 or worse. Pain states the pain is in her buttocks where the poop comes out. She said before she could feel something hanging down where her poops come out if she put her little finger in it. She can no longer feel anything. Patient states she is now having blood when she wipes from going to the restroom. It has blood when she poops, but it is not there when she pees. Patient has been taking Tylenol which is only helping a little bit. Patient has an appointment with [name] tomorrow 05/06/2021 at 4:00 pm, but wishes she could be seen sooner as something needs to be done.   Annual wellness visit in last year? No Most Recent BP reading: 112/62 on 03/04/2021   [name], CPP notified  [name], [place] Clinical Pharmacy Assistant [phone]  Time Spent: 23 Minutes

## 2021-05-05 NOTE — Telephone Encounter (Signed)
Will see at OV.  Thanks.  

## 2021-05-05 NOTE — Telephone Encounter (Signed)
Forwarding to triage, pt is most likely ok to wait for PCP appt tomorrow but will have nurse reach out.  Per conversation with pharmacy assistant today:  It was apparent during our conversation that the patient was not feeling well. I asked her about it. Patient states she is in pain. She can tell she is having the same issue as before with her intestines being swollen. She states it hurts to walk or move; when she walks she can feel the fluid in her intestines. States the pain is usually a 5/10 or worse. Pain states the pain is in her buttocks where the poop comes out. She said before she could feel something hanging down where her poops come out if she put her little finger in it. She can no longer feel anything. Patient states she is now having blood when she wipes from going to the restroom. It has blood when she poops, but it is not there when she pees. Patient has been taking Tylenol which is only helping a little bit. Patient has an appointment with [name] tomorrow 05/06/2021 at 4:00 pm, but wishes she could be seen sooner as something needs to be done.

## 2021-05-05 NOTE — Telephone Encounter (Signed)
Agree with the assessment. She can wait and be seen in person tomorrow

## 2021-05-05 NOTE — Telephone Encounter (Addendum)
I spoke with pt and she said she has had on and off blood in BM since December 2022. Pt said has seen Dr Damita Dunnings x 1 about this but pt feels like it is worse now. Pt said there is no blood in commode water and pt said had normal BM earlier before lunch and pt said the stool was soft; no constipation and no diarrhea. Pt did see small amt of bright red blood when cleaning herself.  Pt does not have abd pain but has some rectal pain with BM on and off. Pt thought she had hemorrhoid last week but does not think she has any hemorrhoid now. Pt is not for sure if hemorrhoids now or not. Pt said she is in no distress but just wants to find out the cause and take care of it. Pt has already scheduled appt with Dr Damita Dunnings on 05/06/21 at 4 pm. UC & ED precautions given and pt voiced understanding. Sending note to DR Damita Dunnings who is out of office and Romilda Garret NP who is in office today and Janett Billow CMA.

## 2021-05-06 ENCOUNTER — Ambulatory Visit (INDEPENDENT_AMBULATORY_CARE_PROVIDER_SITE_OTHER): Payer: Medicare HMO | Admitting: Family Medicine

## 2021-05-06 ENCOUNTER — Encounter: Payer: Self-pay | Admitting: Family Medicine

## 2021-05-06 ENCOUNTER — Other Ambulatory Visit: Payer: Self-pay

## 2021-05-06 VITALS — BP 128/60 | HR 69 | Temp 97.2°F | Ht 66.0 in | Wt 198.0 lb

## 2021-05-06 DIAGNOSIS — K644 Residual hemorrhoidal skin tags: Secondary | ICD-10-CM

## 2021-05-06 DIAGNOSIS — R6889 Other general symptoms and signs: Secondary | ICD-10-CM | POA: Diagnosis not present

## 2021-05-06 DIAGNOSIS — R159 Full incontinence of feces: Secondary | ICD-10-CM

## 2021-05-06 MED ORDER — HYDROCORTISONE ACE-PRAMOXINE 2.5-1 % EX CREA: TOPICAL_CREAM | CUTANEOUS | 1 refills | Status: DC

## 2021-05-06 NOTE — Progress Notes (Signed)
This visit occurred during the SARS-CoV-2 public health emergency.  Safety protocols were in place, including screening questions prior to the visit, additional usage of staff PPE, and extensive cleaning of exam room while observing appropriate contact time as indicated for disinfecting solutions.  D/w pt about prev imaging, done back on 01/2021.    Diverticulosis of the left colon. Suspicion of low level diverticulitis in the proximal sigmoid region. No advanced finding. Chololithiasis without CT evidence of cholecystitis or obstruction. Aortic Atherosclerosis (ICD10-I70.0).  GI/abd sx d/w pt.  She notes abd discomfort with eating sweats, less sx with trigger avoidance.    She had occ fecal incontinence.  She had h/o BRBPR with BM occ but not black stools.  She is trying not to get irritated with wiping.  She started wearing a pad at night in the meantime. Burning rectal pain.  No burning with urination.  No fevers.  She has used OTC hemorrhoid cream recently.    Cough and other sx are better, she feels well except for the rectal complaint above.  SABA used prn to help with cough.    Meds, vitals, and allergies reviewed.   ROS: Per HPI unless specifically indicated in ROS section   Nad Ncat Neck supple, no LA Rrr Ctab Abd soft, not ttp Chaperoned exam without gross blood but she does have an irritated external hemorrhoid noted.

## 2021-05-06 NOTE — Patient Instructions (Addendum)
I would try using the cream twice a day if needed for pain or irritation.  I think it makes sense to see the GI clinic to see about options for treatment.  Take care.  Glad to see you.

## 2021-05-07 ENCOUNTER — Telehealth: Payer: Self-pay

## 2021-05-07 NOTE — Telephone Encounter (Signed)
CALLED PATIENT NO ANSWER LEFT VOICEMAIL FOR A CALL BACK ? ?

## 2021-05-10 ENCOUNTER — Telehealth: Payer: Self-pay

## 2021-05-10 DIAGNOSIS — K644 Residual hemorrhoidal skin tags: Secondary | ICD-10-CM | POA: Insufficient documentation

## 2021-05-10 MED ORDER — TRIAMCINOLONE ACETONIDE 0.1 % EX CREA: 1.0000 "application " | TOPICAL_CREAM | Freq: Two times a day (BID) | CUTANEOUS | 0 refills | Status: DC

## 2021-05-10 NOTE — Telephone Encounter (Signed)
LMTCB

## 2021-05-10 NOTE — Telephone Encounter (Signed)
I am not sure; that was all the PA denial gave me. Did not give covered options

## 2021-05-10 NOTE — Assessment & Plan Note (Signed)
Discussed options.  Reasonable to treat symptomatically with topical medication (she can use topical steroid, triamcinolone, since we could not get other medications covered).  I still think it makes sense to see the GI clinic given that she is having fecal incontinence.  I do not suspect that she has active diverticulitis and she is still okay for outpatient follow-up.  She agrees with plan.  Referral to GI placed.

## 2021-05-10 NOTE — Telephone Encounter (Addendum)
She could try plain triamcinolone twice a day if needed.  I sent the rx for that.  Please let patient know about the situation.  Thanks.

## 2021-05-10 NOTE — Addendum Note (Signed)
Addended by: [name] on: 05/10/2021 12:06 PM   Modules accepted: Orders

## 2021-05-10 NOTE — Telephone Encounter (Signed)
Returning phone call °

## 2021-05-10 NOTE — Telephone Encounter (Signed)
Thank you for trying to get this covered.  The guideline here is absurd.  Is there anything that is covered? Please let me know.  Thanks.

## 2021-05-10 NOTE — Telephone Encounter (Signed)
Called patient and spoke to her about the medication and changes made. Patient verbalized understanding. Advised to call back if cream does not help.

## 2021-05-10 NOTE — Telephone Encounter (Signed)
PA was done for Hydrocortisone Ace-Pramoxine 2.5-1% cream in covermymeds.com. KEY: [id number].   PA was denied. Denial reason: [employer] follows Medicare rules. The Medicare rule in the Prescription Drug Benefit Manual (Chapter 6, Section 10.1 &amp; 10.9) says for coverage under Medicare Part D, the drug must be approved by the U.S. Food and Drug Administration (FDA). The FDA has not approved the requested drug as safe and effective, and per Medicare rules is not covered

## 2021-05-14 ENCOUNTER — Ambulatory Visit (INDEPENDENT_AMBULATORY_CARE_PROVIDER_SITE_OTHER): Payer: Medicare HMO | Admitting: *Deleted

## 2021-05-14 DIAGNOSIS — Z659 Problem related to unspecified psychosocial circumstances: Secondary | ICD-10-CM

## 2021-05-14 DIAGNOSIS — I4891 Unspecified atrial fibrillation: Secondary | ICD-10-CM

## 2021-05-14 DIAGNOSIS — I259 Chronic ischemic heart disease, unspecified: Secondary | ICD-10-CM

## 2021-05-14 DIAGNOSIS — I10 Essential (primary) hypertension: Secondary | ICD-10-CM

## 2021-05-14 NOTE — Chronic Care Management (AMB) (Signed)
?Chronic Care Management  ? ? Clinical Social Work Note ? ?05/14/2021 ?Name: [name] MRN: [id number] DOB: January 22, 1936 ? ?KODIE PICK is a 86 y.o. year old female who is a primary care patient of [name], MD. The CCM team was consulted to assist the patient with chronic disease management and/or care coordination needs related to: [employer] , Food Insecurity, and Financial Difficulties related to limited income/misuse of monies by family .  ? ?Engaged with patient by telephone for initial visit in response to provider referral for social work chronic care management and care coordination services.  ? ?Consent to Services:  ?The patient was given information about Chronic Care Management services, agreed to services, and gave verbal consent prior to initiation of services.  Please see initial visit note for detailed documentation.  ? ?Patient agreed to services and consent obtained.  ? ?Assessment: Review of patient past medical history, allergies, medications, and health status, including review of relevant consultants reports was performed today as part of a comprehensive evaluation and provision of chronic care management and care coordination services.    ? ?SDOH (Social Determinants of Health) assessments and interventions performed:   ? ?Advanced Directives Status: Not addressed in this encounter. ? ?CCM Care Plan ? ?Allergies  ?Allergen Reactions  ? Amoxicillin Swelling  ?  Face hands  ? Penicillins Swelling and Other (See Comments)  ?  Has patient had a PCN reaction causing immediate rash, facial/tongue/throat swelling, SOB or lightheadedness with hypotension: Yes ?Has patient had a PCN reaction causing severe rash involving mucus membranes or skin necrosis: No ?Has patient had a PCN reaction that required hospitalization No ?phalosporin use.  ? Valium [Diazepam] Other (See Comments)  ?  Pt had shakes  ? Imdur [Isosorbide Nitrate]   ?  Headache at 60mg  dose  ? ? ?Outpatient Encounter  Medications as of 05/14/2021  ?Medication Sig  ? albuterol (VENTOLIN HFA) 108 (90 Base) MCG/ACT inhaler Inhale 2 puffs by mouth every 6 hours as needed for wheezing/shortness of breath  ? ALPRAZolam (XANAX) 0.5 MG tablet TAKE ONE TABLET BY MOUTH twice daily AS NEEDED FOR ANXIETY  ? atorvastatin (LIPITOR) 80 MG tablet TAKE 1 TABLET BY MOUTH  DAILY (Patient taking differently: Take 80 mg by mouth daily.)  ? Cholecalciferol (VITAMIN D) 2000 units CAPS Take 2,000 Units by mouth daily.  ? diltiazem (CARDIZEM CD) 180 MG 24 hr capsule Take 1 capsule (180 mg total) by mouth daily.  ? ELIQUIS 5 MG TABS tablet TAKE ONE TABLET BY MOUTH TWICE DAILY (Patient taking differently: Take 5 mg by mouth 2 (two) times daily.)  ? fluticasone (FLONASE) 50 MCG/ACT nasal spray USE TWO SPRAYS in each nostril ONCE DAILY (Patient taking differently: Place 2 sprays into both nostrils daily.)  ? fluticasone (FLOVENT HFA) 110 MCG/ACT inhaler Inhale 2 puffs into the lungs in the morning and at bedtime. Rinse after use  ? hydrocortisone (ANUSOL-HC) 25 MG suppository Place 1 suppository (25 mg total) rectally 2 (two) times daily as needed for hemorrhoids or anal itching.  ? lansoprazole (PREVACID) 30 MG capsule Take 1 capsule (30 mg total) by mouth 2 (two) times daily.  ? lisinopril (ZESTRIL) 5 MG tablet Take 1 tablet (5 mg total) by mouth daily. If BP is above 160/90, then take an extra tab of lisinopril.  Max 2 doses of lisinopril in a day.  ? sertraline (ZOLOFT) 100 MG tablet TAKE 1 AND 1/2 TABLETS BY MOUTH ONCE DAILY  ? triamcinolone cream (KENALOG)  0.1 % Apply 1 application topically 2 (two) times daily.  ? vitamin B-12 (CYANOCOBALAMIN) 1000 MCG tablet Take 1,000 mcg by mouth daily.  ? ?No facility-administered encounter medications on file as of 05/14/2021.  ? ? ?Patient Active Problem List  ? Diagnosis Date Noted  ? External hemorrhoid 05/10/2021  ? Sigmoid diverticulitis 02/11/2021  ? BRBPR (bright red blood per rectum) 02/10/2021  ? Change in  stool 01/31/2021  ? Other social stressor 09/27/2020  ? Cecal polyp   ? Diverticulosis   ? GI bleed 07/29/2020  ? Foot callus 12/30/2019  ? Gout 08/21/2017  ? Vision loss 07/30/2017  ? Headache 07/30/2017  ? Syncope 12/08/2016  ? Atrial fibrillation ([place]) 12/05/2016  ? Advance care planning 09/09/2016  ? Fatigue 01/20/2016  ? Arthritis 04/13/2015  ? Rhinitis 02/25/2015  ? Osteoarthritis of both knees 11/13/2014  ? Primary osteoarthritis of both knees 11/13/2014  ? Cough 11/13/2013  ? Flank pain 11/21/2012  ? GERD (gastroesophageal reflux disease)   ? Back pain 05/04/2011  ? Hand pain 10/27/2010  ? Osteoporosis 08/23/2010  ? UNSPECIFIED VITAMIN D DEFICIENCY 09/23/2008  ? DEPRESSION/ANXIETY 09/23/2008  ? Obesity (BMI 30.0-34.9) 06/21/2006  ? CORONARY ARTERY DISEASE, S/P PTCA 06/21/2006  ? Extrinsic asthma 06/21/2006  ? Essential hypertension 06/13/1999  ? Hyperlipidemia 03/15/1999  ? ? ?Conditions to be addressed/monitored: Atrial Fibrillation; Financial constraints related to limited income and family concerns for misuse of monies ? ?Care Plan : LCSW Plan of Care  ?Updates made by Deirdre Peer, LCSW since 05/14/2021 12:00 AM  ?  ? ?Problem: Emotional Distress due to financial hardships and concerns   ?Priority: High  ?Note:   ?Current barriers:   ? Financial constraints related to bills not getting paid- power bill at $700 per pt. ?Clinical Goals: Patient will work with APS to address needs related to financial concerns ?Clinical Interventions:  ? ?CSW spoke with pt who reports still struggling to get all the bills paid- "I am trying to get my light bill caught up and not let it happen again".  She admits to her family member "not paying the bill like she said she was".   ?CSW will plan to follow up again in 6 weeks for updates and further assessment/planning. ? ? ? ?CSW spoke with pt who reports no recent concerns with her money being mis-used since December. She reports [name] is helping with the  managing of and monitoring. She also reports the [employer] money being offered through her insurance benefits is also helpful.  ?"I am feeling better and breathing better.....haven't had any sweet tea in weeks".  ?CSW left a message for Tyrone and await his callback to collaborate further.  ? ?CSW will plan to touch base with pt in the next 6 weeks. ? ?Assessment of needs, barriers , agencies contacted, as well as how impacting  ?Solution-Focused Strategies ?Active listening / Reflection utilized  ?Problem Solving /Task Center strategies reviewed ?Collaborated with Sharyn Lull, [facility]  ?Review various resources, discussed options and provided patient information about  ?Department of Social Services ( APS ) ?Referral to care guide (financial support)  ?1:1 collaboration with primary care provider regarding development and update of comprehensive plan of care as evidenced by provider attestation and co-signature ?Inter-disciplinary care team collaboration (see longitudinal plan of care) ?Patient Goals/Self-Care Activities: Over the next 30 days ?Collaborate with the community resource care guide ?work with the DSS/APS  ?Go to bank to ensure safety of bank accounts ?Call 911 if your money,  credit cards or other personal valuables are being taken without your consent. ?Consider guardianship to be appointed ?  ?  ?  ? ?Follow Up Plan: Appointment scheduled for SW follow up with client by phone on: 06/25/21 ?     ? Eduard Clos MSW, LCSW ?Licensed Clinical Social Worker ?[place]   ?[phone]  ? ? ?

## 2021-05-14 NOTE — Patient Instructions (Signed)
Visit Information ? ?Thank you for taking time to visit with me today. Please don't hesitate to contact me if I can be of assistance to you before our next scheduled telephone appointment. ? ?Following are the goals we discussed today:  ?(Copy and paste patient goals from clinical care plan here) ? ?Our next appointment is by telephone on 06/25/21  ? ?Please call the care guide team at [phone] if you need to cancel or reschedule your appointment.  ? ?If you are experiencing a Mental Health or [place] or need someone to talk to, please go to [facility] [address] [place] [phone]) ?call 911  ? ?The patient verbalized understanding of instructions, educational materials, and care plan provided today and declined offer to receive copy of patient instructions, educational materials, and care plan.  ?Eduard Clos MSW, LCSW ?Licensed Clinical Social Worker ?[place]   ?[phone]  ?

## 2021-05-27 DIAGNOSIS — H353122 Nonexudative age-related macular degeneration, left eye, intermediate dry stage: Secondary | ICD-10-CM | POA: Diagnosis not present

## 2021-05-27 DIAGNOSIS — H353211 Exudative age-related macular degeneration, right eye, with active choroidal neovascularization: Secondary | ICD-10-CM | POA: Diagnosis not present

## 2021-05-27 DIAGNOSIS — R6889 Other general symptoms and signs: Secondary | ICD-10-CM | POA: Diagnosis not present

## 2021-06-02 ENCOUNTER — Other Ambulatory Visit: Payer: Self-pay | Admitting: Family Medicine

## 2021-06-03 DIAGNOSIS — R6889 Other general symptoms and signs: Secondary | ICD-10-CM | POA: Diagnosis not present

## 2021-06-08 ENCOUNTER — Telehealth: Payer: Self-pay

## 2021-06-08 NOTE — Progress Notes (Signed)
? ? ?Chronic Care Management ?Pharmacy Assistant  ? ?Name: [name]  MRN: [id number] DOB: [birth year] ? ?Reason for Encounter: CCM (Medication Adherence & Delivery Coordination) ?  ?Recent office visits:  ?05/10/2021 Change due to PA denial: Triamcinolone Acetonide 0.1% ?05/06/2021 Jacqueline Stain, MD  Hospital F/U - Incontinence of feces. Start: Pramoxine-HC 2.5-1%. Referral to Gastroenterology.  ? ?Recent consult visits:  ?None since last CCM contact ? ?Hospital visits:  ?None since last CCM contact ? ?Medications: ?Outpatient Encounter Medications as of 06/08/2021  ?Medication Sig  ? albuterol (VENTOLIN HFA) 108 (90 Base) MCG/ACT inhaler Inhale 2 puffs by mouth every 6 hours as needed for wheezing/shortness of breath  ? ALPRAZolam (XANAX) 0.5 MG tablet TAKE ONE TABLET BY MOUTH twice daily AS NEEDED FOR ANXIETY  ? atorvastatin (LIPITOR) 80 MG tablet TAKE 1 TABLET BY MOUTH  DAILY (Patient taking differently: Take 80 mg by mouth daily.)  ? Cholecalciferol (VITAMIN D) 2000 units CAPS Take 2,000 Units by mouth daily.  ? diltiazem (CARDIZEM CD) 180 MG 24 hr capsule Take 1 capsule (180 mg total) by mouth daily.  ? ELIQUIS 5 MG TABS tablet TAKE ONE TABLET BY MOUTH TWICE DAILY (Patient taking differently: Take 5 mg by mouth 2 (two) times daily.)  ? fluticasone (FLONASE) 50 MCG/ACT nasal spray USE TWO SPRAYS in each nostril ONCE DAILY (Patient taking differently: Place 2 sprays into both nostrils daily.)  ? fluticasone (FLOVENT HFA) 110 MCG/ACT inhaler Inhale 2 puffs into the lungs in the morning and at bedtime. Rinse after use  ? hydrocortisone (ANUSOL-HC) 25 MG suppository Place 1 suppository (25 mg total) rectally 2 (two) times daily as needed for hemorrhoids or anal itching.  ? lansoprazole (PREVACID) 30 MG capsule Take 1 capsule (30 mg total) by mouth 2 (two) times daily.  ? lisinopril (ZESTRIL) 5 MG tablet Take 1 tablet (5 mg total) by mouth daily. If BP is above 160/90, then take an extra tab of lisinopril.  Max 2  doses of lisinopril in a day.  ? sertraline (ZOLOFT) 100 MG tablet TAKE 1 AND 1/2 TABLETS BY MOUTH ONCE DAILY  ? triamcinolone cream (KENALOG) 0.1 % apply ONE application TOPICALLY twice daily  ? vitamin B-12 (CYANOCOBALAMIN) 1000 MCG tablet Take 1,000 mcg by mouth daily.  ? ?No facility-administered encounter medications on file as of 06/08/2021.  ? ?BP Readings from Last 3 Encounters:  ?05/06/21 128/60  ?03/04/21 112/62  ?02/18/21 134/75  ?  ?Lab Results  ?Component Value Date  ? HGBA1C 5.9 10/17/2011  ?  ?Recent OV, Consult or Hospital visit:  ?Recent medication changes indicated:  ? Triamcinolone Acetonide 0.1% ? ?Last adherence delivery date: 05/17/2021     ? ?Patient is due for next adherence delivery on: 06/15/2021 ? ?Multiple attempts made to reach patient. Unsuccessful outreach. Will refill based off of last adherence fill.  ? ?This delivery to include: Adherence Packaging  30 Days  ?Packs: ?Eliquis '5mg'$  take 1 tablet 2 times daily breakfast and evening meal ?Sertraline '100mg'$   take 1 1/2 tablets daily breakfast, evening meal  ?Diltiazem '180mg'$  24 hr capsule 1 capsule by mouth daily breakfast  ?Atorvastatin '80mg'$  Tablet 1 tablet by mouth daily breakfast ?Vitamin B-12 1067mg Take 1,000 by mouth daily breakfast ?Vitamin D3 285m  take 1 tablet daily breakfast ?Lansoprazole '30mg'$  Take 1 tablet at breakfast and 1 tablet evening meal ? ?Vial Medications:  ?Fluticasone propionate 110/mcg 2 puffs in the morning and bedtime ?Fluticasone nasal spray 50 mcg 2 sprays in each nostril daily ?  Lisinopril '5mg'$   take 1 tablet daily - may take extra dose if BP over 160/90, PRN in vial ?Albuterol HFA 180 inhaler 2 puffs daily as needed ?Triamcinolone Acetonide 0.1% apply twice daily ?  ?Can not be filled: ?Alprazolam 0.5 mg Take 1 tab twice daily as needed - Patient got filled by [facility] on 05/20/2021 for 30 ds. ? ?Is patient in packaging Yes ?  ? ?Refills requested from providers include: ?Triamcinolone Acetonide 0.1% apply twice  daily ? ?Delivery scheduled for 06/15/2021. Unable to speak with patient to confirm date.  04/04 ? ?Annual wellness visit in last year? No ?Most Recent BP reading: 128/60 on 05/06/2021 ?[name], CPP notified ? ?[name], RMA ?Clinical Pharmacy Assistant ?[phone] ? ? ?

## 2021-06-09 ENCOUNTER — Telehealth: Payer: Self-pay | Admitting: Family Medicine

## 2021-06-09 ENCOUNTER — Other Ambulatory Visit: Payer: Self-pay | Admitting: Family Medicine

## 2021-06-09 NOTE — Telephone Encounter (Signed)
Pt called stating that she is very upset . Pt stated that she made an appointment with the Gastroenterology on 06/03/21. Pt stated that when she went to the appointment they stated to her that she didn't have an appointment. Pt states that they made an appointment for her on 07/01/21 in [place] pt states she is not going to [facility]. Please advise. ?

## 2021-06-10 ENCOUNTER — Telehealth: Payer: Self-pay | Admitting: *Deleted

## 2021-06-10 NOTE — Telephone Encounter (Signed)
Please help patient with rescheduling appt to preferred location ?

## 2021-06-10 NOTE — Telephone Encounter (Signed)
Refill request for Alprazolam 0.5 mg tabs ? ?LOV - 05/06/21 ?Next OV - not scheduled ?Last refill - 04/25/21 #60/1 ? ?

## 2021-06-10 NOTE — Telephone Encounter (Signed)
Ginger, can you please take a look at this patients referral/appt and maybe tell what might have been the disconnect?

## 2021-06-10 NOTE — Chronic Care Management (AMB) (Signed)
?  Care Management  ? ?Note ? ?06/10/2021 ?Name: [name] MRN: [id number] DOB: December 03, 1935 ? ?[name] is a 86 y.o. year old female who is a primary care patient of [name], MD and is actively engaged with the care management team. I reached out to [name] by phone today to assist with re-scheduling a follow up visit with the Licensed Clinical Social Worker ? ?Follow up plan: ?Unsuccessful telephone outreach attempt made. A HIPAA compliant phone message was left for the patient providing contact information and requesting a return call.  ? ?Nicolai Labonte, CCMA ?Care Guide, Embedded Care Coordination ?[place]  Care Management  ?Direct Dial: [phone] ? ? ?

## 2021-06-11 DIAGNOSIS — I4891 Unspecified atrial fibrillation: Secondary | ICD-10-CM

## 2021-06-11 DIAGNOSIS — I259 Chronic ischemic heart disease, unspecified: Secondary | ICD-10-CM

## 2021-06-11 DIAGNOSIS — I10 Essential (primary) hypertension: Secondary | ICD-10-CM

## 2021-06-11 NOTE — Telephone Encounter (Signed)
[facility] is working trying to get sooner appt ?

## 2021-06-11 NOTE — Telephone Encounter (Signed)
Urgent referral for fecal incontinence ? Pt needs sooner appt than 07/01/21 if available due to symptoms. Thanks! ?

## 2021-06-13 NOTE — Telephone Encounter (Signed)
Duly noted.  Thanks. 

## 2021-06-14 ENCOUNTER — Telehealth: Payer: Self-pay

## 2021-06-14 NOTE — Telephone Encounter (Signed)
Patient left a voicemail and wants to schedule her appointment on 07/01/21 with Dr. Marius Ditch. States she has already went to the doctor and does not need to see [place]  ?

## 2021-06-15 NOTE — Telephone Encounter (Signed)
[facility] ([facility]) was unable to work patient in sooner than 07/01/21 so the patient cancelled her appt all together.  ? ?I cannot help this patient any further, [facility] has also exhausted their options as well. The patient cancelled the only appt that we were able to get her with their office stating that she wants to be seen sooner.  ? ?Only other option is to try getting her in with [facility] but they are not going to be able to work her in any sooner than 07/01/21 unless they have a cancellation and there is no one ahead of her on the cancellation list. The patient can also call [facility] back and try and get her 07/01/21 appt back.  ?

## 2021-06-15 NOTE — Chronic Care Management (AMB) (Signed)
?  Care Management  ? ?Note ? ?06/15/2021 ?Name: [name] MRN: [id number] DOB: 06-Jul-1935 ? ?[name] is a 86 y.o. year old female who is a primary care patient of [name], MD and is actively engaged with the care management team. I reached out to [name] by phone today to assist with re-scheduling a follow up visit with the Licensed Clinical Social Worker ? ?Follow up plan: ?Telephone appointment with care management team member scheduled for: 07/02/2021 ? ?Temima Kutsch, CCMA ?Care Guide, Embedded Care Coordination ?[place]  Care Management  ?Direct Dial: [phone] ? ? ?

## 2021-06-15 NOTE — Telephone Encounter (Signed)
Patient cancelled her appointment with Dr. Marius Ditch on the 20th because she needs to be seen sooner. ? ? ?

## 2021-06-16 NOTE — Telephone Encounter (Signed)
Please see below, check with patient.  See what her current symptoms are.  I think it makes sense to see GI whenever and wherever possible.  Thanks.  ?

## 2021-06-16 NOTE — Telephone Encounter (Signed)
Noted. Thanks.

## 2021-06-16 NOTE — Telephone Encounter (Signed)
Left message to return call to our office.  

## 2021-06-17 NOTE — Telephone Encounter (Signed)
Patient called sates she has not make appointment with GI yet but she will call today. She has not had any changes in symptoms from when she was evaluated in our office.  ?

## 2021-06-25 ENCOUNTER — Telehealth: Payer: Medicare HMO

## 2021-06-30 DIAGNOSIS — R6889 Other general symptoms and signs: Secondary | ICD-10-CM | POA: Diagnosis not present

## 2021-06-30 DIAGNOSIS — H353211 Exudative age-related macular degeneration, right eye, with active choroidal neovascularization: Secondary | ICD-10-CM | POA: Diagnosis not present

## 2021-06-30 DIAGNOSIS — H3540 Unspecified peripheral retinal degeneration: Secondary | ICD-10-CM | POA: Diagnosis not present

## 2021-06-30 DIAGNOSIS — H43813 Vitreous degeneration, bilateral: Secondary | ICD-10-CM | POA: Diagnosis not present

## 2021-06-30 DIAGNOSIS — H353122 Nonexudative age-related macular degeneration, left eye, intermediate dry stage: Secondary | ICD-10-CM | POA: Diagnosis not present

## 2021-07-01 ENCOUNTER — Ambulatory Visit: Payer: Medicare HMO | Admitting: Gastroenterology

## 2021-07-02 ENCOUNTER — Ambulatory Visit (INDEPENDENT_AMBULATORY_CARE_PROVIDER_SITE_OTHER): Payer: Medicare HMO | Admitting: *Deleted

## 2021-07-02 DIAGNOSIS — Z659 Problem related to unspecified psychosocial circumstances: Secondary | ICD-10-CM

## 2021-07-02 DIAGNOSIS — I259 Chronic ischemic heart disease, unspecified: Secondary | ICD-10-CM

## 2021-07-02 DIAGNOSIS — I4891 Unspecified atrial fibrillation: Secondary | ICD-10-CM

## 2021-07-02 DIAGNOSIS — I10 Essential (primary) hypertension: Secondary | ICD-10-CM

## 2021-07-02 NOTE — Patient Instructions (Signed)
Visit Information ? ?Thank you for taking time to visit with me today. Please don't hesitate to contact me if I can be of assistance to you before our next scheduled telephone appointment. ? ?Following are the goals we discussed today:  ?(Copy and paste patient goals from clinical care plan here) ? ?Our next appointment is by telephone on    08/06/21 ? ?Please call the care guide team at [phone] if you need to cancel or reschedule your appointment.  ? ?If you are experiencing a Mental Health or [place] or need someone to talk to, please call 911  ? ?The patient verbalized understanding of instructions, educational materials, and care plan provided today and declined offer to receive copy of patient instructions, educational materials, and care plan.  ? ?Eduard Clos MSW, LCSW ?Licensed Clinical Social Worker ?[place]   ?[phone]  ?

## 2021-07-02 NOTE — Chronic Care Management (AMB) (Signed)
?Chronic Care Management  ? ? Clinical Social Work Note ? ?07/02/2021 ?Name: [name] MRN: [id number] DOB: 04-Aug-1935 ? ?[name] is a 86 y.o. year old female who is a primary care patient of [name], MD. The CCM team was consulted to assist the patient with chronic disease management and/or care coordination needs related to: [employer]  and Financial Difficulties related to mis-use of account (previous) .  ? ?Engaged with patient by telephone for follow up visit in response to provider referral for social work chronic care management and care coordination services.  ? ?Consent to Services:  ?The patient was given information about Chronic Care Management services, agreed to services, and gave verbal consent prior to initiation of services.  Please see initial visit note for detailed documentation.  ? ?Patient agreed to services and consent obtained.  ? ?Assessment: Review of patient past medical history, allergies, medications, and health status, including review of relevant consultants reports was performed today as part of a comprehensive evaluation and provision of chronic care management and care coordination services.    ? ?SDOH (Social Determinants of Health) assessments and interventions performed:   ? ?Advanced Directives Status: Not addressed in this encounter. ? ?CCM Care Plan ? ?Allergies  ?Allergen Reactions  ? Amoxicillin Swelling  ?  Face hands  ? Penicillins Swelling and Other (See Comments)  ?  Has patient had a PCN reaction causing immediate rash, facial/tongue/throat swelling, SOB or lightheadedness with hypotension: Yes ?Has patient had a PCN reaction causing severe rash involving mucus membranes or skin necrosis: No ?Has patient had a PCN reaction that required hospitalization No ?phalosporin use.  ? Valium [Diazepam] Other (See Comments)  ?  Pt had shakes  ? Imdur [Isosorbide Nitrate]   ?  Headache at '60mg'$  dose  ? ? ?Outpatient Encounter Medications as of 07/02/2021   ?Medication Sig  ? albuterol (VENTOLIN HFA) 108 (90 Base) MCG/ACT inhaler Inhale 2 puffs by mouth every 6 hours as needed for wheezing/shortness of breath  ? ALPRAZolam (XANAX) 0.5 MG tablet TAKE ONE TABLET BY MOUTH twice daily AS NEEDED FOR ANXIETY  ? atorvastatin (LIPITOR) 80 MG tablet TAKE 1 TABLET BY MOUTH  DAILY (Patient taking differently: Take 80 mg by mouth daily.)  ? Cholecalciferol (VITAMIN D) 2000 units CAPS Take 2,000 Units by mouth daily.  ? diltiazem (CARDIZEM CD) 180 MG 24 hr capsule Take 1 capsule (180 mg total) by mouth daily.  ? ELIQUIS 5 MG TABS tablet TAKE ONE TABLET BY MOUTH TWICE DAILY (Patient taking differently: Take 5 mg by mouth 2 (two) times daily.)  ? fluticasone (FLONASE) 50 MCG/ACT nasal spray USE TWO SPRAYS in each nostril ONCE DAILY (Patient taking differently: Place 2 sprays into both nostrils daily.)  ? fluticasone (FLOVENT HFA) 110 MCG/ACT inhaler Inhale 2 puffs into the lungs in the morning and at bedtime. Rinse after use  ? hydrocortisone (ANUSOL-HC) 25 MG suppository Place 1 suppository (25 mg total) rectally 2 (two) times daily as needed for hemorrhoids or anal itching.  ? lansoprazole (PREVACID) 30 MG capsule Take 1 capsule (30 mg total) by mouth 2 (two) times daily.  ? lisinopril (ZESTRIL) 5 MG tablet Take 1 tablet (5 mg total) by mouth daily. If BP is above 160/90, then take an extra tab of lisinopril.  Max 2 doses of lisinopril in a day.  ? sertraline (ZOLOFT) 100 MG tablet TAKE 1 AND 1/2 TABLETS BY MOUTH ONCE DAILY  ? triamcinolone cream (KENALOG) 0.1 % apply  ONE application TOPICALLY twice daily  ? vitamin B-12 (CYANOCOBALAMIN) 1000 MCG tablet Take 1,000 mcg by mouth daily.  ? ?No facility-administered encounter medications on file as of 07/02/2021.  ? ? ?Patient Active Problem List  ? Diagnosis Date Noted  ? External hemorrhoid 05/10/2021  ? Sigmoid diverticulitis 02/11/2021  ? BRBPR (bright red blood per rectum) 02/10/2021  ? Change in stool 01/31/2021  ? Other social  stressor 09/27/2020  ? Cecal polyp   ? Diverticulosis   ? GI bleed 07/29/2020  ? Foot callus 12/30/2019  ? Gout 08/21/2017  ? Vision loss 07/30/2017  ? Headache 07/30/2017  ? Syncope 12/08/2016  ? Atrial fibrillation ([place]) 12/05/2016  ? Advance care planning 09/09/2016  ? Fatigue 01/20/2016  ? Arthritis 04/13/2015  ? Rhinitis 02/25/2015  ? Osteoarthritis of both knees 11/13/2014  ? Primary osteoarthritis of both knees 11/13/2014  ? Cough 11/13/2013  ? Flank pain 11/21/2012  ? GERD (gastroesophageal reflux disease)   ? Back pain 05/04/2011  ? Hand pain 10/27/2010  ? Osteoporosis 08/23/2010  ? UNSPECIFIED VITAMIN D DEFICIENCY 09/23/2008  ? DEPRESSION/ANXIETY 09/23/2008  ? Obesity (BMI 30.0-34.9) 06/21/2006  ? CORONARY ARTERY DISEASE, S/P PTCA 06/21/2006  ? Extrinsic asthma 06/21/2006  ? Essential hypertension 06/13/1999  ? Hyperlipidemia 03/15/1999  ? ? ?Conditions to be addressed/monitored: CAD and Depression; Financial constraints related to limited income and family mis-use ? ?Care Plan : LCSW Plan of Care  ?Updates made by Jacqueline Peer, LCSW since 07/02/2021 12:00 AM  ?  ? ?Problem: Emotional Distress due to financial hardships and concerns   ?Priority: High  ?Note:   ?Current barriers:   ? Financial constraints related to bills not getting paid- power bill at $700 per pt. ?Clinical Goals: Patient will work with APS to address needs related to financial concerns ?Clinical Interventions:  ? ?CSW spoke with pt who reports she is working to get all her bills paid up and onlyhas 2 payments left for her light bill to paid in full. She denies any current concerns or issues with her finances; stating her debit card nor bank account are being mis-used.  ?  ?CSW spoke with pt who reports no recent concerns with her money being mis-used since December. She reports her grandson [name] is helping with the managing of and monitoring. She also reports the [employer] money being offered through her insurance benefits is also  helpful.  ?"I am feeling better and breathing better.....haven't had any sweet tea in weeks".  ?CSW left a message for [name] and await his callback to collaborate further.  ? ?CSW will plan to touch base with pt in the next 6 weeks. ? ?Assessment of needs, barriers , agencies contacted, as well as how impacting  ?Solution-Focused Strategies ?Active listening / Reflection utilized  ?Problem Solving /Task Center strategies reviewed ?Collaborated with [name], [facility]  ?Review various resources, discussed options and provided patient information about  ?Department of Social Services ( APS ) ?Referral to care guide (financial support)  ?1:1 collaboration with primary care provider regarding development and update of comprehensive plan of care as evidenced by provider attestation and co-signature ?Inter-disciplinary care team collaboration (see longitudinal plan of care) ?Patient Goals/Self-Care Activities: Over the next 30 days ?Collaborate with the community resource care guide ?work with the DSS/APS  ?Go to bank to ensure safety of bank accounts ?Call 911 if your money, credit cards or other personal valuables are being taken without your consent. ?Consider guardianship to be appointed ?  ?  ?  ? ?  Follow Up Plan: Appointment scheduled for SW follow up with client by phone on: 08/06/21 ?     ?[name] MSW, LCSW ?Licensed Clinical Social Worker ?[place]   ?[phone]  ? ? ?

## 2021-07-05 ENCOUNTER — Telehealth: Payer: Self-pay | Admitting: Family Medicine

## 2021-07-05 ENCOUNTER — Other Ambulatory Visit: Payer: Self-pay | Admitting: Family Medicine

## 2021-07-05 NOTE — Telephone Encounter (Signed)
Pt states she has referral for GI. Pt appointment scheduled 8/25.  ? ?Pt stated she can not wait that long for appointment she's having stomach pain.  ? ?Please advise.  ?

## 2021-07-05 NOTE — Telephone Encounter (Signed)
Hello Im not apart of the GI office to be able to assist with that. ? ?FYI ?

## 2021-07-06 ENCOUNTER — Telehealth: Payer: Self-pay

## 2021-07-06 NOTE — Progress Notes (Signed)
? ? ?Chronic Care Management ?Pharmacy Assistant  ? ?Name: [name]  MRN: [id number] DOB: [birth year] ? ?Reason for Encounter: CCM (Medication Adherence and Delivery Coordination) ?  ?Recent office visits:  ?None since last CCM contact ? ?Recent consult visits:  ?None since last CCM contact ? ?Hospital visits:  ?None since last CCM contact ? ?Medications: ?Outpatient Encounter Medications as of 07/06/2021  ?Medication Sig  ? albuterol (VENTOLIN HFA) 108 (90 Base) MCG/ACT inhaler Inhale 2 puffs by mouth every 6 hours as needed for wheezing/shortness of breath  ? ALPRAZolam (XANAX) 0.5 MG tablet TAKE ONE TABLET BY MOUTH twice daily AS NEEDED FOR ANXIETY  ? atorvastatin (LIPITOR) 80 MG tablet TAKE ONE TABLET BY MOUTH EVERY EVENING  ? Cholecalciferol (VITAMIN D) 2000 units CAPS Take 2,000 Units by mouth daily.  ? diltiazem (CARDIZEM CD) 180 MG 24 hr capsule Take 1 capsule (180 mg total) by mouth daily.  ? ELIQUIS 5 MG TABS tablet TAKE ONE TABLET BY MOUTH TWICE DAILY  ? fluticasone (FLONASE) 50 MCG/ACT nasal spray USE TWO SPRAYS in each nostril ONCE DAILY (Patient taking differently: Place 2 sprays into both nostrils daily.)  ? fluticasone (FLOVENT HFA) 110 MCG/ACT inhaler Inhale 2 puffs into the lungs in the morning and at bedtime. Rinse after use  ? hydrocortisone (ANUSOL-HC) 25 MG suppository Place 1 suppository (25 mg total) rectally 2 (two) times daily as needed for hemorrhoids or anal itching.  ? lansoprazole (PREVACID) 30 MG capsule Take 1 capsule (30 mg total) by mouth 2 (two) times daily.  ? lisinopril (ZESTRIL) 5 MG tablet Take 1 tablet (5 mg total) by mouth daily. If BP is above 160/90, then take an extra tab of lisinopril.  Max 2 doses of lisinopril in a day.  ? sertraline (ZOLOFT) 100 MG tablet TAKE 1 AND 1/2 TABLETS BY MOUTH ONCE DAILY  ? triamcinolone cream (KENALOG) 0.1 % apply one application TOPICALLY twice daily  ? vitamin B-12 (CYANOCOBALAMIN) 1000 MCG tablet Take 1,000 mcg by mouth daily.  ? ?No  facility-administered encounter medications on file as of 07/06/2021.  ? ?BP Readings from Last 3 Encounters:  ?05/06/21 128/60  ?03/04/21 112/62  ?02/18/21 134/75  ?  ?Lab Results  ?Component Value Date  ? HGBA1C 5.9 10/17/2011  ?  ?No OVs, Consults, or hospital visits since last care coordination call / Pharmacist visit. ?No medication changes indicated ? ?Last adherence delivery date: 06/15/2021     ? ?Patient is due for next adherence delivery on: 07/15/2021 ? ?Multiple attempts made to reach patient. Unsuccessful outreach. Will refill based off of last adherence fill.  ? ?This delivery to include: Adherence Packaging  30 Days  ?Packs: ?Vitamin B-12 1010mg Take 1,000 by mouth daily breakfast ?Vitamin D3 232m  take 1 tablet daily breakfast ?Sertraline '100mg'$   take 1 1/2 tablets daily breakfast, evening meal  ?Lansoprazole '30mg'$  Take 1 tablet at breakfast and 1 tablet evening meal ?Diltiazem '180mg'$  24 hr capsule 1 capsule by mouth daily breakfast  ?Eliquis '5mg'$  take 1 tablet 2 times daily breakfast and evening meal ?Atorvastatin '80mg'$  Tablet 1 tablet by mouth daily breakfast ?  ?Vial Medications:  ?Fluticasone nasal spray 50 mcg 2 sprays in each nostril daily ?Albuterol HFA 180 inhaler 2 puffs daily as needed ?Fluticasone propionate 110/mcg 2 puffs in the morning and bedtime ?Lisinopril '5mg'$   take 1 tablet daily - may take extra dose if BP over 160/90, PRN in vial ?Triamcinolone Acetonide 0.1% apply twice daily ?  ?Refills requested from providers  include: ?Eliquis '5mg'$  take 1 tablet 2 times daily breakfast and evening meal ?Atorvastatin '80mg'$  Tablet 1 tablet by mouth daily breakfast ? ?Delivery scheduled for 07/15/2021. Unable to speak with patient to confirm date.  05/04 ? ?Recent blood pressure readings are as follows: Unable to speak with patient.  ? ?Annual wellness visit in last year? No ?Most Recent BP reading: 128/60 on 05/06/2021 ? ?[name], CPP notified ? ?[name], RMA ?Clinical Pharmacy  Assistant ?[phone] ? ?

## 2021-07-22 ENCOUNTER — Telehealth: Payer: Self-pay | Admitting: Family Medicine

## 2021-07-22 NOTE — Telephone Encounter (Signed)
Pt called in and wants to talk to Dr Carole Civil nurse, she wouldn't give me any details. She asked for a call back at [phone] ?

## 2021-07-22 NOTE — Telephone Encounter (Signed)
Tried to call patient but no answer and VM not set up ?

## 2021-07-27 ENCOUNTER — Telehealth: Payer: Self-pay | Admitting: Family Medicine

## 2021-07-27 NOTE — Telephone Encounter (Signed)
Tried calling patient to schedule Medicare Annual Wellness Visit (AWV) either virtually or phone ? ? ?No answer  ? ? ?Last AWV 09/08/16 ?; please schedule at anytime with health coach ? ? ?

## 2021-07-29 DIAGNOSIS — R6889 Other general symptoms and signs: Secondary | ICD-10-CM | POA: Diagnosis not present

## 2021-08-02 ENCOUNTER — Ambulatory Visit (INDEPENDENT_AMBULATORY_CARE_PROVIDER_SITE_OTHER): Payer: Medicare HMO | Admitting: Family Medicine

## 2021-08-02 ENCOUNTER — Other Ambulatory Visit: Payer: Self-pay | Admitting: Family Medicine

## 2021-08-02 ENCOUNTER — Encounter: Payer: Self-pay | Admitting: Family Medicine

## 2021-08-02 VITALS — BP 140/80 | HR 68 | Temp 96.3°F | Ht 66.0 in | Wt 195.0 lb

## 2021-08-02 DIAGNOSIS — R0609 Other forms of dyspnea: Secondary | ICD-10-CM | POA: Diagnosis not present

## 2021-08-02 DIAGNOSIS — R300 Dysuria: Secondary | ICD-10-CM | POA: Diagnosis not present

## 2021-08-02 LAB — COMPREHENSIVE METABOLIC PANEL
ALT: 15 U/L (ref 0–35)
AST: 23 U/L (ref 0–37)
Albumin: 3.9 g/dL (ref 3.5–5.2)
Alkaline Phosphatase: 88 U/L (ref 39–117)
BUN: 24 mg/dL — ABNORMAL HIGH (ref 6–23)
CO2: 27 mEq/L (ref 19–32)
Calcium: 8.9 mg/dL (ref 8.4–10.5)
Chloride: 103 mEq/L (ref 96–112)
Creatinine, Ser: 0.82 mg/dL (ref 0.40–1.20)
GFR: 66.08 mL/min (ref >60.00)
Glucose, Bld: 78 mg/dL (ref 70–99)
Potassium: 4 mEq/L (ref 3.5–5.1)
Sodium: 138 mEq/L (ref 135–145)
Total Bilirubin: 0.4 mg/dL (ref 0.2–1.2)
Total Protein: 7 g/dL (ref 6.0–8.3)

## 2021-08-02 LAB — CBC WITH DIFFERENTIAL/PLATELET
Basophils Absolute: 0 10*3/uL (ref 0.0–0.1)
Basophils Relative: 0.5 % (ref 0.0–3.0)
Eosinophils Absolute: 0.1 10*3/uL (ref 0.0–0.7)
Eosinophils Relative: 1.4 % (ref 0.0–5.0)
HCT: 34.5 % — ABNORMAL LOW (ref 36.0–46.0)
Hemoglobin: 11.6 g/dL — ABNORMAL LOW (ref 12.0–15.0)
Lymphocytes Relative: 33.1 % (ref 12.0–46.0)
Lymphs Abs: 2.3 10*3/uL (ref 0.7–4.0)
MCHC: 33.6 g/dL (ref 30.0–36.0)
MCV: 85.8 fl (ref 78.0–100.0)
Monocytes Absolute: 0.5 10*3/uL (ref 0.1–1.0)
Monocytes Relative: 6.6 % (ref 3.0–12.0)
Neutro Abs: 4 10*3/uL (ref 1.4–7.7)
Neutrophils Relative %: 58.4 % (ref 43.0–77.0)
Platelets: 212 10*3/uL (ref 150.0–400.0)
RBC: 4.02 Mil/uL (ref 3.87–5.11)
RDW: 14.8 % (ref 11.5–15.5)
WBC: 6.9 10*3/uL (ref 4.0–10.5)

## 2021-08-02 LAB — TSH: TSH: 0.84 u[IU]/mL (ref 0.35–5.50)

## 2021-08-02 LAB — BRAIN NATRIURETIC PEPTIDE: Pro B Natriuretic peptide (BNP): 184 pg/mL — ABNORMAL HIGH (ref 0.0–100.0)

## 2021-08-02 NOTE — Patient Instructions (Signed)
Go to the lab on the way out.   If you have mychart we'll likely use that to update you.    Don't change your meds yet but we'll be in touch  Take care.  Glad to see you.

## 2021-08-02 NOTE — Progress Notes (Unsigned)
DOE, feeling weak in general.  Still with diarrhea and change in urine odor.  No dysuria.    She urinated at the clinic prior to being able to give a sample.    Diarrhea after eating "like it is going through me."  She is out of breathing when walking.  No CP.  Diarrhea going on since 02/2021.  No blood in stool except from hemorrhoid.  No black stools.  No bleeding o/w.      Recently with ST, had a fever last week.  Both resolved in the meantime.   No cough now- that is better.    Meds, vitals, and allergies reviewed.   ROS: Per HPI unless specifically indicated in ROS section

## 2021-08-02 NOTE — Telephone Encounter (Signed)
Patient was seen today by [name]; closing note.

## 2021-08-03 ENCOUNTER — Telehealth: Payer: Self-pay

## 2021-08-03 NOTE — Progress Notes (Signed)
Chronic Care Management Pharmacy Assistant   Name: [name]  MRN: [id number] DOB: 08-14-1935  Reason for Encounter: CCM (Medication Adherence and Delivery Coordination)  Recent office visits:  08/02/21 [name], MD Dyspnea on exertion Abnormal Labs: No provider notes. No med changes.   Recent consult visits:  None since last CCM contact  Hospital visits:  None since last CCM contact  Medications: Outpatient Encounter Medications as of 08/03/2021  Medication Sig   albuterol (VENTOLIN HFA) 108 (90 Base) MCG/ACT inhaler Inhale 2 puffs by mouth every 6 hours as needed for wheezing/shortness of breath   ALPRAZolam (XANAX) 0.5 MG tablet TAKE ONE TABLET BY MOUTH twice daily AS NEEDED FOR ANXIETY   atorvastatin (LIPITOR) 80 MG tablet TAKE ONE TABLET BY MOUTH EVERY EVENING   Cholecalciferol (VITAMIN D) 2000 units CAPS Take 2,000 Units by mouth daily.   diltiazem (CARDIZEM CD) 180 MG 24 hr capsule TAKE ONE CAPSULE BY MOUTH ONCE DAILY   ELIQUIS 5 MG TABS tablet TAKE ONE TABLET BY MOUTH TWICE DAILY   fluticasone (FLONASE) 50 MCG/ACT nasal spray USE TWO SPRAYS in each nostril ONCE DAILY   fluticasone (FLOVENT HFA) 110 MCG/ACT inhaler Inhale 2 puffs into the lungs in the morning and at bedtime. Rinse after use   hydrocortisone (ANUSOL-HC) 25 MG suppository Place 1 suppository (25 mg total) rectally 2 (two) times daily as needed for hemorrhoids or anal itching.   lansoprazole (PREVACID) 30 MG capsule Take 1 capsule (30 mg total) by mouth 2 (two) times daily.   lisinopril (ZESTRIL) 5 MG tablet Take 1 tablet (5 mg total) by mouth daily. If BP is above 160/90, then take an extra tab of lisinopril.  Max 2 doses of lisinopril in a day.   sertraline (ZOLOFT) 100 MG tablet TAKE 1 AND 1/2 TABLETS BY MOUTH ONCE DAILY   triamcinolone cream (KENALOG) 0.1 % apply one application TOPICALLY twice daily   vitamin B-12 (CYANOCOBALAMIN) 1000 MCG tablet Take 1,000 mcg by mouth daily.   No  facility-administered encounter medications on file as of 08/03/2021.   BP Readings from Last 3 Encounters:  08/02/21 140/80  05/06/21 128/60  03/04/21 112/62    Lab Results  Component Value Date   HGBA1C 5.9 10/17/2011    Recent OV, Consult or Hospital visit:  No medication changes indicated  Last adherence delivery date: 07/15/2021      Patient is due for next adherence delivery on: 08/13/2021  Multiple attempts made to reach patient. Unsuccessful outreach. Will refill based off of last adherence fill.   This delivery to include: Adherence Packaging  30 Days  Packs: Vitamin B-12 1036mg Take 1,000 by mouth daily breakfast Vitamin D3 239m  take 1 tablet daily breakfast Sertraline '100mg'$   take 1 1/2 tablets daily breakfast, evening meal  Lansoprazole '30mg'$  Take 1 tablet at breakfast and 1 tablet evening meal Diltiazem '180mg'$  24 hr capsule 1 capsule by mouth daily breakfast  Eliquis '5mg'$  take 1 tablet 2 times daily breakfast and evening meal Atorvastatin '80mg'$  Tablet 1 tablet by mouth daily breakfast   Vial Medications:  Fluticasone nasal spray 50 mcg 2 sprays in each nostril daily Albuterol HFA 180 inhaler 2 puffs daily as needed Fluticasone propionate 110/mcg 2 puffs in the morning and bedtime Lisinopril '5mg'$   take 1 tablet daily - may take extra dose if BP over 160/90, PRN in vial Triamcinolone Acetonide 0.1% apply twice daily  Not due until 08/20/2021 Alprazolam 0.5 mg 1 tablet twice daily PRN  Delivery  scheduled for 08/13/2021. Unable to speak with patient to confirm date.   Annual wellness visit in last year? No Most Recent BP reading: 140/80 on 08/02/2021   [name], CPP notified   [name], [place] Clinical Pharmacy Assistant [phone]

## 2021-08-04 ENCOUNTER — Other Ambulatory Visit: Payer: Self-pay | Admitting: Family Medicine

## 2021-08-04 DIAGNOSIS — R0609 Other forms of dyspnea: Secondary | ICD-10-CM | POA: Insufficient documentation

## 2021-08-04 DIAGNOSIS — R0602 Shortness of breath: Secondary | ICD-10-CM | POA: Insufficient documentation

## 2021-08-04 MED ORDER — FUROSEMIDE 20 MG PO TABS: 20.0000 mg | ORAL_TABLET | Freq: Every day | ORAL | 0 refills | Status: DC | PRN

## 2021-08-04 NOTE — Assessment & Plan Note (Signed)
She can collect a urine sample at home and have another person drop it off in the near future.  We will check urinalysis and urine culture at that point.

## 2021-08-04 NOTE — Assessment & Plan Note (Addendum)
Unclear source.  Lungs are clear.  No edema.  History of A-fib and murmur noted.  Reasonable to check routine labs and go from there.  Unclear if this is related to diarrhea but she does not appear dehydrated.  Not short of breath at time of exam, speaking in complete sentences and still okay for outpatient follow-up.

## 2021-08-06 ENCOUNTER — Telehealth: Payer: Medicare HMO

## 2021-08-06 ENCOUNTER — Telehealth: Payer: Self-pay

## 2021-08-06 MED ORDER — SULFAMETHOXAZOLE-TRIMETHOPRIM 800-160 MG PO TABS: 1.0000 | ORAL_TABLET | Freq: Two times a day (BID) | ORAL | 0 refills | Status: DC

## 2021-08-06 NOTE — Telephone Encounter (Signed)
Patient called states that she is having hard time getting ride to drop off sample. She will give [place] a call later today to let us know if she can get by today for that. She also wanted to let you know that she is not feeling any better and starting to have some pain and pressure with urination. Advised patient the importance of getting that sample so we can check. She will try to get sent in today.

## 2021-08-06 NOTE — Addendum Note (Signed)
Addended by: [name] on: 08/06/2021 02:14 PM   Modules accepted: Orders

## 2021-08-06 NOTE — Telephone Encounter (Signed)
If she can get the sample dropped off, then please proceed.  Given the situation, I think it makes sense to treat her preemptively.  I sent the rx for septra in the meantime.  If she has the urine collected in the meantime, even if not dropped off yet, she could start septra.  If worse over the weekend, then please have her call the triage line.  Thanks.

## 2021-08-06 NOTE — Telephone Encounter (Signed)
Looks like Joellen called patient about her lab results; will discuss with her once she returns call

## 2021-08-11 NOTE — Telephone Encounter (Signed)
Called patient but no answer and VM is not set up

## 2021-08-12 ENCOUNTER — Other Ambulatory Visit: Payer: Self-pay | Admitting: Family Medicine

## 2021-08-12 DIAGNOSIS — R6889 Other general symptoms and signs: Secondary | ICD-10-CM | POA: Diagnosis not present

## 2021-08-12 DIAGNOSIS — H43812 Vitreous degeneration, left eye: Secondary | ICD-10-CM | POA: Diagnosis not present

## 2021-08-12 DIAGNOSIS — H353211 Exudative age-related macular degeneration, right eye, with active choroidal neovascularization: Secondary | ICD-10-CM | POA: Diagnosis not present

## 2021-08-12 DIAGNOSIS — H353122 Nonexudative age-related macular degeneration, left eye, intermediate dry stage: Secondary | ICD-10-CM | POA: Diagnosis not present

## 2021-08-12 NOTE — Telephone Encounter (Signed)
Refill request for alprazolam 0.5 mg tablet  LOV - 08/02/21 Next OV - not scheduled Last refill - 06/13/21 #60/1

## 2021-08-15 NOTE — Telephone Encounter (Signed)
Sent. Thanks.   

## 2021-08-17 NOTE — Telephone Encounter (Signed)
Called patient but no answer and VM is not set up

## 2021-08-17 NOTE — Telephone Encounter (Signed)
Spoke with patient and she is doing much better since taking the abx.

## 2021-08-17 NOTE — Telephone Encounter (Signed)
Patient is returning a call and asked for a returned call on cell phone number [phone].

## 2021-08-31 ENCOUNTER — Other Ambulatory Visit: Payer: Self-pay | Admitting: Family Medicine

## 2021-09-01 ENCOUNTER — Telehealth: Payer: Self-pay

## 2021-09-01 NOTE — Progress Notes (Signed)
Chronic Care Management Pharmacy Assistant   Name: [name]  MRN: [id number] DOB: [birth year]  Reason for Encounter: CCM (Medication Adherence and Delivery Coordination)  Recent office visits:  None since last CCM contact  Recent consult visits:  None since last CCM contact  Hospital visits:  None in previous 6 months  Medications: Outpatient Encounter Medications as of 09/01/2021  Medication Sig   albuterol (VENTOLIN HFA) 108 (90 Base) MCG/ACT inhaler Inhale 2 puffs by mouth every 6 hours as needed for wheezing/shortness of breath   ALPRAZolam (XANAX) 0.5 MG tablet TAKE ONE TABLET BY MOUTH twice daily AS NEEDED FOR ANXIETY   atorvastatin (LIPITOR) 80 MG tablet TAKE ONE TABLET BY MOUTH EVERY EVENING   Cholecalciferol (VITAMIN D) 2000 units CAPS Take 2,000 Units by mouth daily.   diltiazem (CARDIZEM CD) 180 MG 24 hr capsule TAKE ONE CAPSULE BY MOUTH ONCE DAILY   ELIQUIS 5 MG TABS tablet TAKE ONE TABLET BY MOUTH TWICE DAILY   fluticasone (FLONASE) 50 MCG/ACT nasal spray USE TWO SPRAYS in each nostril ONCE DAILY   fluticasone (FLOVENT HFA) 110 MCG/ACT inhaler Inhale 2 puffs into the lungs in the morning and at bedtime. Rinse after use   furosemide (LASIX) 20 MG tablet TAKE ONE TABLET BY MOUTH daily AS NEEDED FOR fluid   hydrocortisone (ANUSOL-HC) 25 MG suppository Place 1 suppository (25 mg total) rectally 2 (two) times daily as needed for hemorrhoids or anal itching.   lansoprazole (PREVACID) 30 MG capsule Take 1 capsule (30 mg total) by mouth 2 (two) times daily.   lisinopril (ZESTRIL) 5 MG tablet TAKE ONE TABLET BY MOUTH ONCE DAILY may take extra tab daily if BP is over 160/90, max 2 doses daily   sertraline (ZOLOFT) 100 MG tablet TAKE 1 AND 1/2 TABLETS BY MOUTH ONCE DAILY   sulfamethoxazole-trimethoprim (BACTRIM DS) 800-160 MG tablet Take 1 tablet by mouth 2 (two) times daily.   triamcinolone cream (KENALOG) 0.1 % apply one application TOPICALLY twice daily   vitamin B-12  (CYANOCOBALAMIN) 1000 MCG tablet Take 1,000 mcg by mouth daily.   No facility-administered encounter medications on file as of 09/01/2021.   BP Readings from Last 3 Encounters:  08/02/21 140/80  05/06/21 128/60  03/04/21 112/62    Lab Results  Component Value Date   HGBA1C 5.9 10/17/2011    No OVs, Consults, or hospital visits since last care coordination call / Pharmacist visit. No medication changes indicated  Last adherence delivery date: 08/13/2021      Patient is due for next adherence delivery on: 09/13/2021  Spoke with patient on 09/01/2021 reviewed medications and coordinated delivery.  This delivery to include: Adherence Packaging  30 Days  Packs: Vitamin B-12 1015mg Take 1,000 by mouth daily breakfast Vitamin D3 243m  take 1 tablet daily breakfast Sertraline '100mg'$   take 1 1/2 tablets daily breakfast, evening meal  Lansoprazole '30mg'$  Take 1 tablet at breakfast and 1 tablet evening meal Diltiazem '180mg'$  24 hr capsule 1 capsule by mouth daily breakfast  Eliquis '5mg'$  take 1 tablet 2 times daily breakfast and evening meal Atorvastatin '80mg'$  Tablet 1 tablet by mouth daily breakfast   Vial Medications:  Fluticasone nasal spray 50 mcg 2 sprays in each nostril daily Albuterol HFA 180 inhaler 2 puffs daily as needed Fluticasone propionate 110/mcg 2 puffs in the morning and bedtime Lisinopril '5mg'$   take 1 tablet daily - may take extra dose if BP over 160/90, PRN in vial Triamcinolone Acetonide 0.1% apply twice daily  Can not be filed until 09/17/2021 Alprazolam   Any concerns about your medications? No  How often do you forget or accidentally miss a dose? Rarely  Do you use a pillbox? Yes  Is patient in packaging Yes  If yes  What is the date on your next pill pack? 09/01/2021 at dinner  Any concerns or issues with your packaging? No  No refill request needed.  Confirmed delivery date of 09/13/2021, advised patient that pharmacy will contact them the morning of  delivery.  Recent blood pressure readings are as follows: Patients reports her blood pressure has been doing well. She takes it every day. Patient took it while I was on the phone with her with an automatic arm cuff. 148/63 Pulse 62 - patient was laying down. I asked patient to retake it sitting in her kitchen chair with her feet flat on the floor - reading was 140/73 Pulse 66.  I asked patient to take her blood pressure daily and keep a log. Advised her I would call back on 09/10/2021 for her log. Patient expressed understanding.   Annual wellness visit in last year? No Most Recent BP reading: 140/80 on 08/02/2021   [name], CPP notified   [name], [place] Clinical Pharmacy Assistant [phone]  PTM Review

## 2021-09-15 ENCOUNTER — Telehealth: Payer: Self-pay

## 2021-09-15 NOTE — Telephone Encounter (Signed)
Patient was notified to take her blood pressure for two weeks. These are the readings below. States that she has had dizzy spells and weakness, has increased her water intake.   6/22 - 148/73  6/23 - 166/83 6/24 - 137/75  6/25 - 137/65 6/26 -166/72 6/27-142/73 6/28 -173/69 6/29 -146/79 6/30 -151/78 6/31-146/69 7/2 -144/69 7/3 -146/70 7/5 -147/65

## 2021-09-15 NOTE — Telephone Encounter (Signed)
Those pressures are reasonable.  If she was able to treat the dizziness and weak spells with increased water intake, and if she feels better in the meantime then I would continue as is.  If she continues to have weak spells or dizziness and spite of taking an adequate fluid then let me know.  Thanks.

## 2021-09-16 NOTE — Telephone Encounter (Signed)
Tried to call patient but no answer and VM is not set up

## 2021-09-16 NOTE — Telephone Encounter (Signed)
Patient called back and I notified her about Dr. Carole Civil message. Patient is doing good and will continue as is as advised.

## 2021-09-20 DIAGNOSIS — H353122 Nonexudative age-related macular degeneration, left eye, intermediate dry stage: Secondary | ICD-10-CM | POA: Diagnosis not present

## 2021-09-20 DIAGNOSIS — R6889 Other general symptoms and signs: Secondary | ICD-10-CM | POA: Diagnosis not present

## 2021-09-20 DIAGNOSIS — H353211 Exudative age-related macular degeneration, right eye, with active choroidal neovascularization: Secondary | ICD-10-CM | POA: Diagnosis not present

## 2021-09-21 ENCOUNTER — Telehealth: Payer: Self-pay | Admitting: *Deleted

## 2021-09-21 NOTE — Telephone Encounter (Signed)
  Care Management   Follow Up Note   09/21/2021 Name: [name] MRN: [id number] DOB: 1935-08-27   Referred by: [name], MD Reason for referral : No chief complaint on file.   A second unsuccessful telephone outreach was attempted today. The patient was referred to the case management team for assistance with care management and care coordination.   Follow Up Plan: The care management team will reach out to the patient again over the next 10 days.   Eduard Clos MSW, LCSW Licensed Clinical Social Worker [place]   [phone]

## 2021-09-21 NOTE — Telephone Encounter (Signed)
Error

## 2021-09-24 NOTE — Telephone Encounter (Signed)
error 

## 2021-09-27 ENCOUNTER — Telehealth: Payer: Medicare HMO

## 2021-09-30 ENCOUNTER — Telehealth: Payer: Self-pay | Admitting: *Deleted

## 2021-09-30 NOTE — Telephone Encounter (Signed)
  Care Management   Follow Up Note   09/30/2021 Name: [name] MRN: [id number] DOB: 1936/01/22   Referred by: [name], MD Reason for referral : No chief complaint on file.   Third unsuccessful telephone outreach was attempted today. The patient was referred to the case management team for assistance with care management and care coordination. The patient's primary care provider has been notified of our unsuccessful attempts to make or maintain contact with the patient. The care management team is pleased to engage with this patient at any time in the future should he/she be interested in assistance from the care management team.   Follow Up Plan: No further follow up required:    Eduard Clos MSW, LCSW Licensed Clinical Social Worker [place]   [phone]

## 2021-10-01 ENCOUNTER — Telehealth: Payer: Self-pay

## 2021-10-01 NOTE — Progress Notes (Unsigned)
Chronic Care Management Pharmacy Assistant   Name: [name]  MRN: [id number] DOB: September 16, 1935  Reason for Encounter: CCM (Medication Adherence and Delivery Coordination)  Recent office visits:  None since last CCM contact  Recent consult visits:  None since last CCM contact  Hospital visits:  None in previous 6 months  Medications: Outpatient Encounter Medications as of 10/01/2021  Medication Sig   albuterol (VENTOLIN HFA) 108 (90 Base) MCG/ACT inhaler Inhale 2 puffs by mouth every 6 hours as needed for wheezing/shortness of breath   ALPRAZolam (XANAX) 0.5 MG tablet TAKE ONE TABLET BY MOUTH twice daily AS NEEDED FOR ANXIETY   atorvastatin (LIPITOR) 80 MG tablet TAKE ONE TABLET BY MOUTH EVERY EVENING   Cholecalciferol (VITAMIN D) 2000 units CAPS Take 2,000 Units by mouth daily.   diltiazem (CARDIZEM CD) 180 MG 24 hr capsule TAKE ONE CAPSULE BY MOUTH ONCE DAILY   ELIQUIS 5 MG TABS tablet TAKE ONE TABLET BY MOUTH TWICE DAILY   fluticasone (FLONASE) 50 MCG/ACT nasal spray USE TWO SPRAYS in each nostril ONCE DAILY   fluticasone (FLOVENT HFA) 110 MCG/ACT inhaler Inhale 2 puffs into the lungs in the morning and at bedtime. Rinse after use   furosemide (LASIX) 20 MG tablet TAKE ONE TABLET BY MOUTH daily AS NEEDED FOR fluid   hydrocortisone (ANUSOL-HC) 25 MG suppository Place 1 suppository (25 mg total) rectally 2 (two) times daily as needed for hemorrhoids or anal itching.   lansoprazole (PREVACID) 30 MG capsule Take 1 capsule (30 mg total) by mouth 2 (two) times daily.   lisinopril (ZESTRIL) 5 MG tablet TAKE ONE TABLET BY MOUTH ONCE DAILY may take extra tab daily if BP is over 160/90, max 2 doses daily   sertraline (ZOLOFT) 100 MG tablet TAKE 1 AND 1/2 TABLETS BY MOUTH ONCE DAILY   sulfamethoxazole-trimethoprim (BACTRIM DS) 800-160 MG tablet Take 1 tablet by mouth 2 (two) times daily.   triamcinolone cream (KENALOG) 0.1 % apply one application TOPICALLY twice daily   vitamin B-12  (CYANOCOBALAMIN) 1000 MCG tablet Take 1,000 mcg by mouth daily.   No facility-administered encounter medications on file as of 10/01/2021.   BP Readings from Last 3 Encounters:  08/02/21 140/80  05/06/21 128/60  03/04/21 112/62    Lab Results  Component Value Date   HGBA1C 5.9 10/17/2011    No OVs, Consults, or hospital visits since last care coordination call / Pharmacist visit. No medication changes indicated  Last adherence delivery date: 09/13/2021  Patient is due for next adherence delivery on: 10/13/2021  {Med Review:25223}  This delivery to include: Adherence Packaging  30 Days  Packs: Vitamin B-12 1022mg Take 1,000 by mouth daily breakfast Vitamin D3 236m  take 1 tablet daily breakfast Sertraline '100mg'$   take 1 1/2 tablets daily breakfast, evening meal  Lansoprazole '30mg'$  Take 1 tablet at breakfast and 1 tablet evening meal Diltiazem '180mg'$  24 hr capsule 1 capsule by mouth daily breakfast  Eliquis '5mg'$  take 1 tablet 2 times daily breakfast and evening meal Atorvastatin '80mg'$  Tablet 1 tablet by mouth daily breakfast   Vial Medications:  Fluticasone nasal spray 50 mcg 2 sprays in each nostril daily Albuterol HFA 180 inhaler 2 puffs daily as needed Fluticasone propionate 110/mcg 2 puffs in the morning and bedtime Lisinopril '5mg'$   take 1 tablet daily - may take extra dose if BP over 160/90, PRN in vial Triamcinolone Acetonide 0.1% apply twice daily   Can not be filed until 10/15/2021 Alprazolam   Any concerns  about your medications? No   How often do you forget or accidentally miss a dose? Rarely   Do you use a pillbox? Yes  Is patient in packaging {yes/no:20286}  If yes  What is the date on your next pill pack?  Any concerns or issues with your packaging?  No refill request needed.  {Delivery QJJH:41740} 08/02  Recent blood pressure readings are as follows:***  Annual wellness visit in last year? No Most Recent BP reading: 140/80 on 08/02/2021   [name], CPP notified   [name], [place] Assistant [phone]  Cycle dispensing form sent to {Medreview:27653} for review.

## 2021-10-07 ENCOUNTER — Other Ambulatory Visit: Payer: Self-pay | Admitting: Family Medicine

## 2021-10-07 NOTE — Telephone Encounter (Signed)
Refill request for alprazolam 0.5 mg tablet  LOV - 08/02/21 Next OV - not scheduled Last refill - 08/15/21 #60/1

## 2021-10-21 ENCOUNTER — Ambulatory Visit: Payer: Medicare HMO | Admitting: Gastroenterology

## 2021-10-21 ENCOUNTER — Encounter: Payer: Self-pay | Admitting: Gastroenterology

## 2021-10-21 VITALS — BP 181/76 | HR 61 | Temp 98.2°F | Ht 66.0 in | Wt 195.5 lb

## 2021-10-21 DIAGNOSIS — K640 First degree hemorrhoids: Secondary | ICD-10-CM

## 2021-10-21 DIAGNOSIS — R151 Fecal smearing: Secondary | ICD-10-CM | POA: Diagnosis not present

## 2021-10-21 DIAGNOSIS — R6889 Other general symptoms and signs: Secondary | ICD-10-CM | POA: Diagnosis not present
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
## 2021-10-25 DIAGNOSIS — H353122 Nonexudative age-related macular degeneration, left eye, intermediate dry stage: Secondary | ICD-10-CM | POA: Diagnosis not present

## 2021-10-25 DIAGNOSIS — H43813 Vitreous degeneration, bilateral: Secondary | ICD-10-CM | POA: Diagnosis not present

## 2021-10-25 DIAGNOSIS — R6889 Other general symptoms and signs: Secondary | ICD-10-CM | POA: Diagnosis not present

## 2021-10-25 DIAGNOSIS — H353211 Exudative age-related macular degeneration, right eye, with active choroidal neovascularization: Secondary | ICD-10-CM | POA: Diagnosis not present

## 2021-10-25 DIAGNOSIS — H3540 Unspecified peripheral retinal degeneration: Secondary | ICD-10-CM | POA: Diagnosis not present

## 2021-10-29 ENCOUNTER — Other Ambulatory Visit: Payer: Self-pay | Admitting: Family Medicine

## 2021-10-29 ENCOUNTER — Telehealth: Payer: Self-pay

## 2021-10-29 ENCOUNTER — Other Ambulatory Visit: Payer: Self-pay

## 2021-10-29 NOTE — Progress Notes (Signed)
Chronic Care Management Pharmacy Assistant   Name: [name]  MRN: [id number] DOB: 09-04-1935   Reason for Encounter: CCM (Medication Adherence and Delivery Coordination)  Recent office visits:  None since last CCM contact  Recent consult visits:  10/21/21 Sherri Sear, MD (Gastro) Grade I hemorrhoids  Stop (patient): BACTRIM DS 800-160 MG tablet FU PRN  Hospital visits:  None since last CCM contact  Medications: Outpatient Encounter Medications as of 10/29/2021  Medication Sig   albuterol (VENTOLIN HFA) 108 (90 Base) MCG/ACT inhaler Inhale 2 puffs by mouth every 6 hours as needed for wheezing/shortness of breath   ALPRAZolam (XANAX) 0.5 MG tablet TAKE ONE TABLET BY MOUTH twice daily AS NEEDED FOR ANXIETY   atorvastatin (LIPITOR) 80 MG tablet TAKE ONE TABLET BY MOUTH EVERY EVENING   Cholecalciferol (VITAMIN D) 2000 units CAPS Take 2,000 Units by mouth daily.   diltiazem (CARDIZEM CD) 180 MG 24 hr capsule TAKE ONE CAPSULE BY MOUTH ONCE DAILY   ELIQUIS 5 MG TABS tablet TAKE ONE TABLET BY MOUTH TWICE DAILY   fluticasone (FLONASE) 50 MCG/ACT nasal spray USE TWO SPRAYS in each nostril ONCE DAILY   fluticasone (FLOVENT HFA) 110 MCG/ACT inhaler Inhale 2 puffs into the lungs in the morning and at bedtime. Rinse after use   furosemide (LASIX) 20 MG tablet TAKE ONE TABLET BY MOUTH daily AS NEEDED FOR fluid   hydrocortisone (ANUSOL-HC) 25 MG suppository Place 1 suppository (25 mg total) rectally 2 (two) times daily as needed for hemorrhoids or anal itching.   lansoprazole (PREVACID) 30 MG capsule TAKE ONE CAPSULE BY MOUTH TWICE DAILY   lisinopril (ZESTRIL) 5 MG tablet TAKE ONE TABLET BY MOUTH ONCE DAILY may take extra tab daily if BP is over 160/90, max 2 doses daily   sertraline (ZOLOFT) 100 MG tablet TAKE 1 AND 1/2 TABLETS BY MOUTH ONCE DAILY   triamcinolone cream (KENALOG) 0.1 % apply one application TOPICALLY twice daily   vitamin B-12 (CYANOCOBALAMIN) 1000 MCG tablet Take 1,000  mcg by mouth daily.   No facility-administered encounter medications on file as of 10/29/2021.   BP Readings from Last 3 Encounters:  10/21/21 (!) 181/76  08/02/21 140/80  05/06/21 128/60    Lab Results  Component Value Date   HGBA1C 5.9 10/17/2011    Recent OV, Consult or Hospital visit:  Recent medication changes indicated:   Stop (patient): BACTRIM DS 800-160 MG tablet  Last adherence delivery date: 10/13/21      Patient is due for next adherence delivery on: 11/12/21  Spoke with patient on 11/01/21 reviewed medications and coordinated delivery.  This delivery to include: Adherence Packaging  30 Days  Packs: Sertraline '100mg'$   take 1 1/2 tablets daily breakfast, evening meal  Lansoprazole '30mg'$  Take 1 tablet at breakfast and 1 tablet evening meal Diltiazem '180mg'$  24 hr capsule 1 capsule by mouth daily breakfast  Eliquis '5mg'$  take 1 tablet 2 times daily breakfast and evening meal Atorvastatin '80mg'$  Tablet 1 tablet by mouth daily breakfast   Vial Medications:  Fluticasone propionate 110/mcg 2 puffs in the morning and bedtime - still using that one  Patient would like the following removed from packs: Patient has full bottles at home Vitamin B-12 101mg Take 1,000 by mouth daily breakfast Vitamin D3 229m  take 1 tablet daily breakfast  Patient declined the following medications stating she no longer takes the medications:  Fluticasone nasal spray 50 mcg 2 sprays in each nostril daily - Pt no longer wants to  take Albuterol HFA 180 inhaler 2 puffs daily as needed - not taking as it was making her throat feel like it is closing - hospital told her to throw it away back in December Lisinopril '5mg'$   take 1 tablet daily - may take extra dose if BP over 160/90, PRN in vial - Pt no longer wants to take Triamcinolone Acetonide 0.1% apply twice daily - Pt no longer wants to take Alprazolam 0.5 mg - 1 tab BID - Pt has over 150 remaining - patient is taking 1 morning and 1 night   Patient  stated she has a lot of medications remaining and would like [name] to come to her house and review her medications with her. She would also like for [name] to tell her what to do with the remaining medications she no longer takes. Patient did tell me she was still living at home with [name].   Patient informed me her oldest daughter passed away last Thursday 11-17-21. Patient did not inform me of any details and I did not feel it appropriate to ask. Patient was at the funeral home at the time of the call. Patient asked for me to inform Jacqueline Clos, LCSW of her new phone number as patient would like to speak to her. I sent [name] a message.   Patient has a new cell phone. The number is [phone].  Any concerns about your medications? No  How often do you forget or accidentally miss a dose? Often  Do you use a pillbox? Yes  Is patient in packaging Yes  If yes  What is the date on your next pill pack? Patient was not at home to let me know   Any concerns or issues with your packaging? Patient stated she has a lot of medications remaining and would like [name] to come to her house and review her medications with her. She would also like for [name] to tell her what to do with the remaining medications she no longer takes. Patient did tell me she was still living at home with [name].   Refills requested from providers include: Completed Lansoprazole '30mg'$  Take 1 tablet at breakfast and 1 tablet evening meal  Confirmed delivery date of 11/12/2021, advised patient that pharmacy will contact them the morning of delivery. 11/12/21  Recent blood pressure readings are as follows: Patient did not have readings with her  Annual wellness visit in last year? No Most Recent BP reading: 140/80 on 08/02/2021   [name], CPP notified   [name], [place] Clinical Pharmacy Assistant [phone]  Cycle dispensing form sent to [facility], CPP for review.

## 2021-10-29 NOTE — Patient Outreach (Signed)
[place] [facility]) Care Management  10/29/2021  YUNIQUE DEARCOS October 22, 1935 [id number]   Telephone Screen    Outreach call to patient to introduce [facility] services and assess care needs as part of benefit of PCP office and insurance plan. Both numbers listed for patient not in service.      Plan: RN CM will make outreach attempt to patient within 4 business days. RN CM will send unsuccessful outreach letter to patient.  Enzo Montgomery, RN,BSN,CCM [place] Management Telephonic Care Management Coordinator Direct Phone: [phone] Toll Free: [phone] Fax: [phone]

## 2021-11-01 ENCOUNTER — Telehealth: Payer: Self-pay | Admitting: *Deleted

## 2021-11-01 ENCOUNTER — Other Ambulatory Visit: Payer: Self-pay

## 2021-11-01 NOTE — Patient Outreach (Signed)
  [facility]) Care Management  [facility] Social Work  11/01/2021  [name] 1935-04-08 [id number]  CSW was alerted by [name], PCP office, of pt wanting me to call her. CSW is no longer actively following;however, did attempt to reach her. CSW left voicemail message and will await callback from pt to further determine needs/support.   Eduard Clos MSW, LCSW Licensed Clinical Social Worker      [phone]   Objective:   Encounter Medications:  Outpatient Encounter Medications as of 11/01/2021  Medication Sig   albuterol (VENTOLIN HFA) 108 (90 Base) MCG/ACT inhaler Inhale 2 puffs by mouth every 6 hours as needed for wheezing/shortness of breath   ALPRAZolam (XANAX) 0.5 MG tablet TAKE ONE TABLET BY MOUTH twice daily AS NEEDED FOR ANXIETY   atorvastatin (LIPITOR) 80 MG tablet TAKE ONE TABLET BY MOUTH EVERY EVENING   Cholecalciferol (VITAMIN D) 2000 units CAPS Take 2,000 Units by mouth daily.   diltiazem (CARDIZEM CD) 180 MG 24 hr capsule TAKE ONE CAPSULE BY MOUTH ONCE DAILY   ELIQUIS 5 MG TABS tablet TAKE ONE TABLET BY MOUTH TWICE DAILY   fluticasone (FLONASE) 50 MCG/ACT nasal spray USE TWO SPRAYS in each nostril ONCE DAILY   fluticasone (FLOVENT HFA) 110 MCG/ACT inhaler Inhale 2 puffs into the lungs in the morning and at bedtime. Rinse after use   furosemide (LASIX) 20 MG tablet TAKE ONE TABLET BY MOUTH daily AS NEEDED FOR fluid   hydrocortisone (ANUSOL-HC) 25 MG suppository Place 1 suppository (25 mg total) rectally 2 (two) times daily as needed for hemorrhoids or anal itching.   lansoprazole (PREVACID) 30 MG capsule TAKE ONE CAPSULE BY MOUTH TWICE DAILY   lisinopril (ZESTRIL) 5 MG tablet TAKE ONE TABLET BY MOUTH ONCE DAILY may take extra tab daily if BP is over 160/90, max 2 doses daily   sertraline (ZOLOFT) 100 MG tablet TAKE 1 AND 1/2 TABLETS BY MOUTH ONCE DAILY   triamcinolone cream (KENALOG) 0.1 % apply one application TOPICALLY twice daily   vitamin B-12  (CYANOCOBALAMIN) 1000 MCG tablet Take 1,000 mcg by mouth daily.   No facility-administered encounter medications on file as of 11/01/2021.    Functional Status:     02/10/2021    8:00 PM  In your present state of health, do you have any difficulty performing the following activities:  Hearing? 0  Vision? 0  Difficulty concentrating or making decisions? 0  Walking or climbing stairs? 0  Dressing or bathing? 0  Doing errands, shopping? 0    Fall/Depression Screening:     09/24/2020    8:10 AM 09/08/2016   11:40 AM 03/20/2012   10:34 AM  PHQ 2/9 Scores  PHQ - 2 Score 0 4 0  PHQ- 9 Score 0 8     Assessment:  Care Plan There are no care plans that you recently modified to display for this patient.    Goals Addressed   None     Plan:  Follow-up:  will await pt outreach call

## 2021-11-01 NOTE — Patient Outreach (Signed)
[facility]) Care Management  11/01/2021  [name] [birth year] [id number]   Telephone Screen       Unsuccessful outreach call to patient to introduce [facility] services and assess care needs as part of benefit of PCP office and insurance plan.       Plan: RN CM will make outreach attempt to patient within 4 business days.  Enzo Montgomery, RN,BSN,CCM [place] Management Telephonic Care Management Coordinator Direct Phone: [phone] Toll Free: [phone] Fax: [phone]

## 2021-11-03 ENCOUNTER — Ambulatory Visit (INDEPENDENT_AMBULATORY_CARE_PROVIDER_SITE_OTHER): Payer: Medicare HMO | Admitting: Family Medicine

## 2021-11-03 ENCOUNTER — Ambulatory Visit
Admission: RE | Admit: 2021-11-03 | Discharge: 2021-11-03 | Disposition: A | Discharge status: Home / Self Care | Payer: Medicare HMO | Source: Ambulatory Visit | Attending: Family Medicine | Admitting: Family Medicine

## 2021-11-03 VITALS — BP 124/60 | HR 60 | Temp 97.2°F | Wt 191.4 lb

## 2021-11-03 DIAGNOSIS — M109 Gout, unspecified: Secondary | ICD-10-CM | POA: Diagnosis not present

## 2021-11-03 DIAGNOSIS — R269 Unspecified abnormalities of gait and mobility: Secondary | ICD-10-CM | POA: Insufficient documentation

## 2021-11-03 DIAGNOSIS — R42 Dizziness and giddiness: Secondary | ICD-10-CM | POA: Diagnosis not present

## 2021-11-03 DIAGNOSIS — G319 Degenerative disease of nervous system, unspecified: Secondary | ICD-10-CM | POA: Diagnosis not present

## 2021-11-03 DIAGNOSIS — R278 Other lack of coordination: Secondary | ICD-10-CM

## 2021-11-03 LAB — COMPREHENSIVE METABOLIC PANEL
ALT: 16 U/L (ref 0–35)
AST: 23 U/L (ref 0–37)
Albumin: 4.3 g/dL (ref 3.5–5.2)
Alkaline Phosphatase: 105 U/L (ref 39–117)
BUN: 22 mg/dL (ref 6–23)
CO2: 29 mEq/L (ref 19–32)
Calcium: 9 mg/dL (ref 8.4–10.5)
Chloride: 99 mEq/L (ref 96–112)
Creatinine, Ser: 0.9 mg/dL (ref 0.40–1.20)
GFR: 58.99 mL/min — ABNORMAL LOW (ref >60.00)
Glucose, Bld: 87 mg/dL (ref 70–99)
Potassium: 4.4 mEq/L (ref 3.5–5.1)
Sodium: 136 mEq/L (ref 135–145)
Total Bilirubin: 0.5 mg/dL (ref 0.2–1.2)
Total Protein: 7.7 g/dL (ref 6.0–8.3)

## 2021-11-03 LAB — CBC WITH DIFFERENTIAL/PLATELET
Basophils Absolute: 0 10*3/uL (ref 0.0–0.1)
Basophils Relative: 0.5 % (ref 0.0–3.0)
Eosinophils Absolute: 0.1 10*3/uL (ref 0.0–0.7)
Eosinophils Relative: 0.9 % (ref 0.0–5.0)
HCT: 39.5 % (ref 36.0–46.0)
Hemoglobin: 12.9 g/dL (ref 12.0–15.0)
Lymphocytes Relative: 22.9 % (ref 12.0–46.0)
Lymphs Abs: 1.9 10*3/uL (ref 0.7–4.0)
MCHC: 32.6 g/dL (ref 30.0–36.0)
MCV: 86.7 fl (ref 78.0–100.0)
Monocytes Absolute: 0.6 10*3/uL (ref 0.1–1.0)
Monocytes Relative: 7.1 % (ref 3.0–12.0)
Neutro Abs: 5.8 10*3/uL (ref 1.4–7.7)
Neutrophils Relative %: 68.6 % (ref 43.0–77.0)
Platelets: 194 10*3/uL (ref 150.0–400.0)
RBC: 4.55 Mil/uL (ref 3.87–5.11)
RDW: 15.2 % (ref 11.5–15.5)
WBC: 8.4 10*3/uL (ref 4.0–10.5)

## 2021-11-03 MED ORDER — COLCHICINE 0.6 MG PO TABS: 0.6000 mg | ORAL_TABLET | Freq: Two times a day (BID) | ORAL | 0 refills | Status: DC

## 2021-11-03 NOTE — Assessment & Plan Note (Signed)
Right great toe, and left second digit with erythema without warmth and tenderness to light palpation.  Will initiate colchicine.  If no improvement in 2 days we will start prednisone.

## 2021-11-03 NOTE — Assessment & Plan Note (Signed)
Suspect likely due to dizziness, with some coordination challenges on neurological exam will get MRI of brain to rule out stroke.  She has had 2 falls.

## 2021-11-03 NOTE — Progress Notes (Signed)
Subjective:     [name] is a 86 y.o. female presenting for Dizziness, Gait Problem (States that her balance is off), Fall (Sunday and Tuesday ), and Foot Pain (R)     Dizziness  Fall  Foot Pain    #Fall and dizziness - has been feeling dizzy and lightheaded - was taking her blanket off the bed and got them to the foot of the bed and lost her balance trying to pick up the blankets - foot was tangled in the cover - that fall she hit her nose, no LOC and has a small gash on her nose this was on Tuesday  Dizziness started 3 weeks  Fall on Sunday  - fell backwards - was reaching for a dog statue - was feeling balance off and missed the dog - fell backward on the porch - no headaches   Foot pain - taking tylenol for her foot pain - cannot put her foot down - has had right sided pain for a few days - does have a hx of gout  HTN - bp at home 150/71 - on diltiazem, furosemide as needed for swelling, lisinopril - drinking water - 6 bottles of water yesterday   Review of Systems  Neurological:  Positive for dizziness.     Social History   Tobacco Use  Smoking Status Never  Smokeless Tobacco Never        Objective:    BP Readings from Last 3 Encounters:  11/03/21 124/60  10/21/21 (!) 181/76  08/02/21 140/80   Wt Readings from Last 3 Encounters:  11/03/21 191 lb 6 oz (86.8 kg)  10/21/21 195 lb 8 oz (88.7 kg)  08/02/21 195 lb (88.5 kg)    BP 124/60   Pulse 60   Temp (!) 97.2 F (36.2 C) (Temporal)   Wt 191 lb 6 oz (86.8 kg)   SpO2 97%   BMI 30.89 kg/m    Physical Exam Constitutional:      General: She is not in acute distress.    Appearance: She is well-developed. She is not diaphoretic.  HENT:     Right Ear: External ear normal.     Left Ear: External ear normal.     Nose: Nose normal.  Eyes:     Conjunctiva/sclera: Conjunctivae normal.  Cardiovascular:     Rate and Rhythm: Normal rate and regular rhythm.     Heart sounds: Murmur  heard.  Pulmonary:     Effort: Pulmonary effort is normal. No respiratory distress.     Breath sounds: Normal breath sounds. No wheezing.  Musculoskeletal:     Cervical back: Neck supple.  Skin:    General: Skin is warm and dry.     Capillary Refill: Capillary refill takes less than 2 seconds.  Neurological:     Mental Status: She is alert. Mental status is at baseline.     Cranial Nerves: Facial asymmetry present. No cranial nerve deficit (new new deficit).     Sensory: Sensation is intact.     Motor: Motor function is intact. No weakness, atrophy or abnormal muscle tone.     Coordination: Coordination abnormal (slowed). Impaired rapid alternating movements.     Comments: Chronic left sided facial asymmetry   Psychiatric:        Mood and Affect: Mood normal.        Behavior: Behavior normal.           Assessment & Plan:   Problem List Items Addressed  This Visit       Other   Dizziness    Orthostatic vital signs are within normal limits.  At this time would recommend continuing diltiazem and lisinopril.  We will get MRI of the brain.  We will also get labs to evaluate for other possible causes of dizziness.  Slow transitions, consider further evaluation pending MRI.      Relevant Orders   CBC with Differential   Comprehensive metabolic panel   Gout - Primary    Right great toe, and left second digit with erythema without warmth and tenderness to light palpation.  Will initiate colchicine.  If no improvement in 2 days we will start prednisone.      Relevant Medications   colchicine 0.6 MG tablet   Gait abnormality    Suspect likely due to dizziness, with some coordination challenges on neurological exam will get MRI of brain to rule out stroke.  She has had 2 falls.      Relevant Orders   MR Brain Wo Contrast   Coordination impairment    Slowed coordination, difficulty following direction.  We will get MRI brain to evaluate for possible stroke.  She has noticed  gait and balance issues for 3 weeks.      Relevant Orders   MR Brain Wo Contrast     Return if symptoms worsen or fail to improve.  Lesleigh Noe, MD

## 2021-11-03 NOTE — Assessment & Plan Note (Signed)
Orthostatic vital signs are within normal limits.  At this time would recommend continuing diltiazem and lisinopril.  We will get MRI of the brain.  We will also get labs to evaluate for other possible causes of dizziness.  Slow transitions, consider further evaluation pending MRI.

## 2021-11-03 NOTE — Patient Instructions (Addendum)
Gout - take Colchcine  - stop atovastatin while taking - update if no improvement by Friday and I will send in steroids instead  Balance changes - MRI brain  - slow transitions when standing - Labs today

## 2021-11-03 NOTE — Assessment & Plan Note (Signed)
Slowed coordination, difficulty following direction.  We will get MRI brain to evaluate for possible stroke.  She has noticed gait and balance issues for 3 weeks.

## 2021-11-04 ENCOUNTER — Other Ambulatory Visit: Payer: Self-pay | Admitting: Family Medicine

## 2021-11-04 DIAGNOSIS — I4891 Unspecified atrial fibrillation: Secondary | ICD-10-CM

## 2021-11-04 DIAGNOSIS — R42 Dizziness and giddiness: Secondary | ICD-10-CM

## 2021-11-04 DIAGNOSIS — R296 Repeated falls: Secondary | ICD-10-CM

## 2021-11-04 DIAGNOSIS — R278 Other lack of coordination: Secondary | ICD-10-CM

## 2021-11-05 ENCOUNTER — Telehealth: Payer: Self-pay | Admitting: *Deleted

## 2021-11-05 NOTE — Patient Outreach (Signed)
  Care Coordination   11/05/2021 Name: [name] MRN: [id number] DOB: 1935-05-15   Care Coordination Outreach Attempts:  A second unsuccessful outreach was attempted today to offer the patient with information about available care coordination services as a benefit of their health plan.     Follow Up Plan:  Additional outreach attempts will be made to offer the patient care coordination information and services.   Encounter Outcome:  No Answer  Care Coordination Interventions Activated:  No   Care Coordination Interventions:  No, not indicated   Eduard Clos MSW, LCSW Licensed Clinical Social Worker      [phone]

## 2021-11-09 ENCOUNTER — Telehealth: Payer: Self-pay | Admitting: *Deleted

## 2021-11-09 ENCOUNTER — Telehealth: Payer: Self-pay | Admitting: Family Medicine

## 2021-11-09 NOTE — Patient Outreach (Signed)
  Care Coordination   11/09/2021 Name: [name] MRN: [id number] DOB: [birth year]   Care Coordination Outreach Attempts:  A second unsuccessful outreach was attempted today to offer the patient with information about available care coordination services as a benefit of their health plan.     Follow Up Plan:  Additional outreach attempts will be made to offer the patient care coordination information and services.   Encounter Outcome:  No Answer  Care Coordination Interventions Activated:  No   Care Coordination Interventions:  No, not indicated    Eduard Clos MSW, LCSW Licensed Clinical Social Worker      [phone]

## 2021-11-09 NOTE — Telephone Encounter (Signed)
OK for [facility] PT

## 2021-11-09 NOTE — Telephone Encounter (Signed)
Called [place] and verbal orders have been given.

## 2021-11-09 NOTE — Telephone Encounter (Signed)
Home Health verbal orders Caller Name: [facility] [place] Agency Name: [facility] number: [phone]  Comment: Received written order but missing the doctor signature. They will need a verbal order to start PT today. Please advise.   Please forward to [facility] pool or providers CMA

## 2021-11-10 ENCOUNTER — Ambulatory Visit (INDEPENDENT_AMBULATORY_CARE_PROVIDER_SITE_OTHER): Payer: Medicare HMO | Admitting: *Deleted

## 2021-11-10 DIAGNOSIS — Z0000 Encounter for general adult medical examination without abnormal findings: Secondary | ICD-10-CM

## 2021-11-10 DIAGNOSIS — Z1231 Encounter for screening mammogram for malignant neoplasm of breast: Secondary | ICD-10-CM | POA: Diagnosis not present

## 2021-11-10 DIAGNOSIS — Z780 Asymptomatic menopausal state: Secondary | ICD-10-CM | POA: Diagnosis not present

## 2021-11-10 NOTE — Patient Instructions (Signed)
[name] , Thank you for taking time to come for your Medicare Wellness Visit. I appreciate your ongoing commitment to your health goals. Please review the following plan we discussed and let me know if I can assist you in the future.   Screening recommendations/referrals: Colonoscopy: no longer needed Mammogram: no longer needed Bone Density: no longer needed Recommended yearly ophthalmology/optometry visit for glaucoma screening and checkup Recommended yearly dental visit for hygiene and checkup  Vaccinations: Influenza vaccine: up to date Pneumococcal vaccine: up to date Tdap vaccine: Education provided Shingles vaccine: Education provided    Advanced directives: Education provided  Conditions/risks identified:      Preventive Care 27 Years and Older, Female Preventive care refers to lifestyle choices and visits with your health care provider that can promote health and wellness. What does preventive care include? A yearly physical exam. This is also called an annual well check. Dental exams once or twice a year. Routine eye exams. Ask your health care provider how often you should have your eyes checked. Personal lifestyle choices, including: Daily care of your teeth and gums. Regular physical activity. Eating a healthy diet. Avoiding tobacco and drug use. Limiting alcohol use. Practicing safe sex. Taking low-dose aspirin every day. Taking vitamin and mineral supplements as recommended by your health care provider. What happens during an annual well check? The services and screenings done by your health care provider during your annual well check will depend on your age, overall health, lifestyle risk factors, and family history of disease. Counseling  Your health care provider may ask you questions about your: Alcohol use. Tobacco use. Drug use. Emotional well-being. Home and relationship well-being. Sexual activity. Eating habits. History of falls. Memory and  ability to understand (cognition). Work and work Statistician. Reproductive health. Screening  You may have the following tests or measurements: Height, weight, and BMI. Blood pressure. Lipid and cholesterol levels. These may be checked every 5 years, or more frequently if you are over 57 years old. Skin check. Lung cancer screening. You may have this screening every year starting at age 33 if you have a 30-pack-year history of smoking and currently smoke or have quit within the past 15 years. Fecal occult blood test (FOBT) of the stool. You may have this test every year starting at age 70. Flexible sigmoidoscopy or colonoscopy. You may have a sigmoidoscopy every 5 years or a colonoscopy every 10 years starting at age 79. Hepatitis C blood test. Hepatitis B blood test. Sexually transmitted disease (STD) testing. Diabetes screening. This is done by checking your blood sugar (glucose) after you have not eaten for a while (fasting). You may have this done every 1-3 years. Bone density scan. This is done to screen for osteoporosis. You may have this done starting at age 41. Mammogram. This may be done every 1-2 years. Talk to your health care provider about how often you should have regular mammograms. Talk with your health care provider about your test results, treatment options, and if necessary, the need for more tests. Vaccines  Your health care provider may recommend certain vaccines, such as: Influenza vaccine. This is recommended every year. Tetanus, diphtheria, and acellular pertussis (Tdap, Td) vaccine. You may need a Td booster every 10 years. Zoster vaccine. You may need this after age 2. Pneumococcal 13-valent conjugate (PCV13) vaccine. One dose is recommended after age 71. Pneumococcal polysaccharide (PPSV23) vaccine. One dose is recommended after age 69. Talk to your health care provider about which screenings and vaccines you  need and how often you need them. This information is  not intended to replace advice given to you by your health care provider. Make sure you discuss any questions you have with your health care provider. Document Released: 03/27/2015 Document Revised: 11/18/2015 Document Reviewed: 12/30/2014 Elsevier Interactive Patient Education  2017 [employer] Prevention in the Home Falls can cause injuries. They can happen to people of all ages. There are many things you can do to make your home safe and to help prevent falls. What can I do on the outside of my home? Regularly fix the edges of walkways and driveways and fix any cracks. Remove anything that might make you trip as you walk through a door, such as a raised step or threshold. Trim any bushes or trees on the path to your home. Use bright outdoor lighting. Clear any walking paths of anything that might make someone trip, such as rocks or tools. Regularly check to see if handrails are loose or broken. Make sure that both sides of any steps have handrails. Any raised decks and porches should have guardrails on the edges. Have any leaves, snow, or ice cleared regularly. Use sand or salt on walking paths during winter. Clean up any spills in your garage right away. This includes oil or grease spills. What can I do in the bathroom? Use night lights. Install grab bars by the toilet and in the tub and shower. Do not use towel bars as grab bars. Use non-skid mats or decals in the tub or shower. If you need to sit down in the shower, use a plastic, non-slip stool. Keep the floor dry. Clean up any water that spills on the floor as soon as it happens. Remove soap buildup in the tub or shower regularly. Attach bath mats securely with double-sided non-slip rug tape. Do not have throw rugs and other things on the floor that can make you trip. What can I do in the bedroom? Use night lights. Make sure that you have a light by your bed that is easy to reach. Do not use any sheets or blankets that  are too big for your bed. They should not hang down onto the floor. Have a firm chair that has side arms. You can use this for support while you get dressed. Do not have throw rugs and other things on the floor that can make you trip. What can I do in the kitchen? Clean up any spills right away. Avoid walking on wet floors. Keep items that you use a lot in easy-to-reach places. If you need to reach something above you, use a strong step stool that has a grab bar. Keep electrical cords out of the way. Do not use floor polish or wax that makes floors slippery. If you must use wax, use non-skid floor wax. Do not have throw rugs and other things on the floor that can make you trip. What can I do with my stairs? Do not leave any items on the stairs. Make sure that there are handrails on both sides of the stairs and use them. Fix handrails that are broken or loose. Make sure that handrails are as long as the stairways. Check any carpeting to make sure that it is firmly attached to the stairs. Fix any carpet that is loose or worn. Avoid having throw rugs at the top or bottom of the stairs. If you do have throw rugs, attach them to the floor with carpet tape. Make sure that  you have a light switch at the top of the stairs and the bottom of the stairs. If you do not have them, ask someone to add them for you. What else can I do to help prevent falls? Wear shoes that: Do not have high heels. Have rubber bottoms. Are comfortable and fit you well. Are closed at the toe. Do not wear sandals. If you use a stepladder: Make sure that it is fully opened. Do not climb a closed stepladder. Make sure that both sides of the stepladder are locked into place. Ask someone to hold it for you, if possible. Clearly mark and make sure that you can see: Any grab bars or handrails. First and last steps. Where the edge of each step is. Use tools that help you move around (mobility aids) if they are needed. These  include: Canes. Walkers. Scooters. Crutches. Turn on the lights when you go into a dark area. Replace any light bulbs as soon as they burn out. Set up your furniture so you have a clear path. Avoid moving your furniture around. If any of your floors are uneven, fix them. If there are any pets around you, be aware of where they are. Review your medicines with your doctor. Some medicines can make you feel dizzy. This can increase your chance of falling. Ask your doctor what other things that you can do to help prevent falls. This information is not intended to replace advice given to you by your health care provider. Make sure you discuss any questions you have with your health care provider. Document Released: 12/25/2008 Document Revised: 08/06/2015 Document Reviewed: 04/04/2014 Elsevier Interactive Patient Education  2017 [employer].
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
## 2021-11-11 ENCOUNTER — Ambulatory Visit (INDEPENDENT_AMBULATORY_CARE_PROVIDER_SITE_OTHER): Payer: Medicare HMO | Admitting: Family Medicine

## 2021-11-11 ENCOUNTER — Encounter: Payer: Self-pay | Admitting: Family Medicine

## 2021-11-11 VITALS — BP 120/82 | HR 86 | Temp 97.3°F | Ht 66.0 in | Wt 198.0 lb

## 2021-11-11 DIAGNOSIS — R9389 Abnormal findings on diagnostic imaging of other specified body structures: Secondary | ICD-10-CM

## 2021-11-11 DIAGNOSIS — M109 Gout, unspecified: Secondary | ICD-10-CM

## 2021-11-11 DIAGNOSIS — I10 Essential (primary) hypertension: Secondary | ICD-10-CM

## 2021-11-11 DIAGNOSIS — R32 Unspecified urinary incontinence: Secondary | ICD-10-CM

## 2021-11-11 DIAGNOSIS — R6889 Other general symptoms and signs: Secondary | ICD-10-CM | POA: Diagnosis not present

## 2021-11-11 MED ORDER — LISINOPRIL 5 MG PO TABS
ORAL_TABLET | ORAL | 1 refills | Status: DC
Start: 2021-11-11 — End: 2022-06-02

## 2021-11-11 NOTE — Progress Notes (Signed)
Using 4 point cane.  Her daughter recent died, condolences offered. Her daughter had most recently asked to be cremated and that is pending at this point.  D/w pt.    D/w pt about GI eval.  She had hemorrhoids, improved with anusol.  GI didn't rec hemorrhoid procedure.  Discussed about Kegel exercises for decreased anal sphincter tone.  She had episode of urinary and fecal incontinence.  She has PT set up at home. Discussed getting Kegel exercises set up via PT.    D/w pt about memory and recent MRI.  Mild chronic small vessel ischemic changes within the cerebral white matter and pons.  Mild generalized cerebral atrophy.  Occ lapses but not red flag events.  Oriented to year, month, day.  Can do basic math.  3/3 attention, 2/3 on recall---> 3/3 with prompt.   She is occ lightheaded on standing.    H/o L 2nd toe pain.  D/w pt about using colchicine for gout, statin cautions d/w pt.    Meds, vitals, and allergies reviewed.   ROS: Per HPI unless specifically indicated in ROS section   GEN: nad, alert and oriented HEENT: ncat NECK: supple w/o LA CV:Rrr with SEM PULM: ctab, no inc wob ABD: soft, +bs EXT: no edema SKIN: well perfused.    30 minutes were devoted to patient care in this encounter (this includes time spent reviewing the patient's file/history, interviewing and examining the patient, counseling/reviewing plan with patient).

## 2021-11-11 NOTE — Patient Instructions (Signed)
Try taking colchicine for foot pain but skip atorvastatin when you take it.    Skip lisinopril for now and see if you feel less lightheaded.    We'll check with PT in the meantime.   Take care.  Glad to see you.

## 2021-11-12 ENCOUNTER — Telehealth: Payer: Self-pay | Admitting: Family Medicine

## 2021-11-12 ENCOUNTER — Telehealth: Payer: Self-pay

## 2021-11-12 NOTE — Telephone Encounter (Signed)
Thanks

## 2021-11-12 NOTE — Telephone Encounter (Addendum)
Could not find note about handicap placard; pt saw Dr Einar Pheasant on 11/03/21 and pt saw Dr Damita Dunnings on 11/11/21. Put application for handicap placard in Dr Josefine Class in box. Sending note to Dr Damita Dunnings and Ottie Glazier in box. I saw Dr Damita Dunnings as I was going to put form in in box and Dr Damita Dunnings filled out form and I contacted Otila Kluver (DPR signed ) that form was at front desk for pick up. Otila Kluver voiced understanding and appreciative.

## 2021-11-12 NOTE — Telephone Encounter (Signed)
Patients daughter Otila Kluver called to follow up on Handicap placard form that she had mentioned to Dr Einar Pheasant or nurse(she couldn't remember which one), she said the nurse said we had a form and just had to get Dr Damita Dunnings to sign. She wants to try and pick it up today if possible. Please advise. Call back is [phone]

## 2021-11-12 NOTE — Progress Notes (Signed)
Patient also left me a voicemail. I will call patient in regards to her medications.   [name], CPP notified  [name], [place] Clinical Pharmacy Assistant [phone]

## 2021-11-12 NOTE — Telephone Encounter (Signed)
Patient is requesting call back from nurse about her medication. Call back is [phone]

## 2021-11-12 NOTE — Progress Notes (Signed)
    Chronic Care Management Pharmacy Assistant   Name: [name]  MRN: [id number] DOB: 1935-11-05  Reason for Encounter: CCM (Patient Call Medication Question)   Returned patient called; she stated she needs a refill of Colchicine 0.6 mg as she was told to take it by [name] yesterday. Patient would like it delivered today by [facility].  Patient also asked for [name] to complete applications for handicap placards. Patient would like 3 please. Patient will pick them up at next appointment.  I scheduled patient an in-office appointment with [name] on 11/16/2021 at 11:00 for medication reconciliation as patient stated she has a lot of medication that she does not know if she is supposed to be taking. I advised patient to bring in all medication to appointment including OTC and prescription. [facility] is holding her medication delivery per [name] until her medication reconciliation is completed to correct her medications.   [name], CPP notified  [name], [place] Clinical Pharmacy Assistant [phone]

## 2021-11-15 DIAGNOSIS — R9389 Abnormal findings on diagnostic imaging of other specified body structures: Secondary | ICD-10-CM | POA: Insufficient documentation

## 2021-11-15 DIAGNOSIS — R32 Unspecified urinary incontinence: Secondary | ICD-10-CM | POA: Insufficient documentation

## 2021-11-15 NOTE — Assessment & Plan Note (Signed)
No red flag memory events and we agreed to follow for now.  She can update me as needed.

## 2021-11-15 NOTE — Assessment & Plan Note (Signed)
Note sent to staff to check with PT about getting Kegel exercises performed via PT.

## 2021-11-15 NOTE — Assessment & Plan Note (Signed)
Discussed holding statin temporarily if she has to use colchicine.

## 2021-11-15 NOTE — Assessment & Plan Note (Signed)
Hold lisinopril in the meantime given lightheadedness on standing.

## 2021-11-16 ENCOUNTER — Ambulatory Visit (INDEPENDENT_AMBULATORY_CARE_PROVIDER_SITE_OTHER): Payer: Medicare HMO | Admitting: Pharmacist

## 2021-11-16 ENCOUNTER — Other Ambulatory Visit: Payer: Self-pay | Admitting: Family Medicine

## 2021-11-16 DIAGNOSIS — J4520 Mild intermittent asthma, uncomplicated: Secondary | ICD-10-CM

## 2021-11-16 DIAGNOSIS — E785 Hyperlipidemia, unspecified: Secondary | ICD-10-CM

## 2021-11-16 DIAGNOSIS — I10 Essential (primary) hypertension: Secondary | ICD-10-CM

## 2021-11-16 DIAGNOSIS — I259 Chronic ischemic heart disease, unspecified: Secondary | ICD-10-CM

## 2021-11-16 DIAGNOSIS — M109 Gout, unspecified: Secondary | ICD-10-CM

## 2021-11-16 DIAGNOSIS — I4891 Unspecified atrial fibrillation: Secondary | ICD-10-CM

## 2021-11-16 MED ORDER — FLUTICASONE PROPIONATE HFA 110 MCG/ACT IN AERO: 2.0000 | INHALATION_SPRAY | Freq: Two times a day (BID) | RESPIRATORY_TRACT | 3 refills | Status: DC

## 2021-11-16 NOTE — Progress Notes (Signed)
Chronic Care Management Pharmacy Note  11/16/2021 Name:  [name] MRN:  [id number] DOB:  1935-07-06  Summary: CCM F/U visit -Reviewed medications, pt brought her bottles into clinic - she has many outdated medication vials and extras of albuterol, flonase, triamcinolone cream. Removed expired medication vials from patient's supply and counseled on when to use PRN medications (alprazolam, furosemide, colchicine) -HLD/CAD: LDL was 112 (10/2020), above goal of < 70 given CAD; however per refill dates, pt was likely not compliant with atorvastatin for at least a month prior to that labwork -Gout: reviewed low-purine diet for prevention of gout flares -HTN: dizziness improved with holding lisinopril  Recommendations/Changes made from today's visit: -[facility]inated with [facility] to refill pill packs as usual, hold PRN as patient has excess supply -Recommend repeat lipid panel at next OV  Plan: -[name] will call patient monthly for medication adherence  -Pharmacist follow up televisit scheduled for 3 months    Subjective: [name] is an 86 y.o. year old female who is a primary patient of Damita Dunnings, Elveria Rising, MD.  The CCM team was consulted for assistance with disease management and care coordination needs.    Engaged with patient face to face for follow up visit in response to provider referral for pharmacy case management and/or care coordination services.   Consent to Services:  The patient was given information about Chronic Care Management services, agreed to services, and gave verbal consent prior to initiation of services.  Please see initial visit note for detailed documentation.   Patient Care Team: [name], MD as PCP - General (Family Medicine) Ubaldo Glassing Javier Docker, MD as Consulting Physician (Cardiology) Birder Robson, MD as Referring Physician (Ophthalmology) Charlton Haws, [facility] as Pharmacist (Pharmacist)  Recent office  visits: 11/11/21 Dr Damita Dunnings OV: f/u - discussed holding statin with colchicine. Rec Kegel with PT. Hold lisinopril for lightheadedness.  11/03/21 Dr Einar Pheasant OV: acute gout - start colchicine.  08/02/21 Dr Damita Dunnings OV: acute visit - SOB, dysuria, diarrhea. BNP mildly elevated. Try furosemide 20 mg daily.  Recent consult visits: 10/21/21 Dr Marius Ditch (GI): new patient - hemorrhoids improved with anusol. Ligation not recommended due to Eliquis. Advised Miralax daily and kegel exercises.  03/31/21 Dr Corky Sox (Cardiology): f/u - discontinue metoprolol and imdur (not taking). RTC 6 months.  Hospital visits: 02/10/21 - 02/12/21 admission [name] BRBPR - diverticulitis. Started cipro and flagyl. Restart Eliquis.   Objective:  Lab Results  Component Value Date   CREATININE 0.90 11/03/2021   BUN 22 11/03/2021   GFR 58.99 (L) 11/03/2021   GFRNONAA >60 02/18/2021   GFRAA >60 11/21/2019   NA 136 11/03/2021   K 4.4 11/03/2021   CALCIUM 9.0 11/03/2021   CO2 29 11/03/2021   GLUCOSE 87 11/03/2021    Lab Results  Component Value Date/Time   HGBA1C 5.9 10/17/2011 11:51 AM   GFR 58.99 (L) 11/03/2021 10:53 AM   GFR 66.08 08/02/2021 11:20 AM   MICROALBUR 0.3 06/22/2009 08:35 AM   MICROALBUR 1.2 09/18/2008 09:48 AM    Last diabetic Eye exam: No results found for: "HMDIABEYEEXA"  Last diabetic Foot exam: No results found for: "HMDIABFOOTEX"   Lab Results  Component Value Date   CHOL 180 11/09/2020   HDL 52.30 11/09/2020   LDLCALC 112 (H) 11/09/2020   LDLDIRECT 94.7 01/30/2014   TRIG 78.0 11/09/2020   CHOLHDL 3 11/09/2020       Latest Ref Rng & Units 11/03/2021   10:53 AM 08/02/2021  11:20 AM 02/18/2021   11:54 AM  Hepatic Function  Total Protein 6.0 - 8.3 g/dL 7.7  7.0  7.5   Albumin 3.5 - 5.2 g/dL 4.3  3.9  3.7   AST 0 - 37 U/L _0 ALT 0 - 35 U/L _1 Alk Phosphatase 39 - 117 U/L 105  88  78   Total Bilirubin 0.2 - 1.2 mg/dL 0.5  0.4  1.2     Lab Results  Component Value  Date/Time   TSH 0.84 08/02/2021 11:20 AM   TSH 1.62 11/09/2020 01:00 PM       Latest Ref Rng & Units 11/03/2021   10:53 AM 08/02/2021   11:20 AM 03/04/2021   12:49 PM  CBC  WBC 4.0 - 10.5 K/uL 8.4  6.9  6.6   Hemoglobin 12.0 - 15.0 g/dL 12.9  11.6  11.6   Hematocrit 36.0 - 46.0 % 39.5  34.5  35.7   Platelets 150.0 - 400.0 K/uL 194.0  212.0  177.0     Lab Results  Component Value Date/Time   VD25OH 45.28 09/06/2018 11:10 AM   VD25OH 42.92 09/01/2016 12:47 PM    Clinical ASCVD: Yes  The ASCVD Risk score (Arnett DK, et al., 2019) failed to calculate for the following reasons:   The 2019 ASCVD risk score is only valid for ages 46 to 63       11/11/2021    8:36 AM 11/10/2021   11:16 AM 09/24/2020    8:10 AM  Depression screen PHQ 2/9  Decreased Interest 0 0 0  Down, Depressed, Hopeless 0 0 0  PHQ - 2 Score 0 0 0  Altered sleeping 0  0  Tired, decreased energy 0  0  Change in appetite 0  0  Feeling bad or failure about yourself  0  0  Trouble concentrating 0  0  Moving slowly or fidgety/restless 0  0  Suicidal thoughts 0  0  PHQ-9 Score 0  0  Difficult doing work/chores Not difficult at all       CHA2DS2/VAS Stroke Risk Points  Current as of 2 hours ago     7 >= 2 Points: High Risk  1 - 1.99 Points: Medium Risk  0 Points: Low Risk    Last Change: N/A       Points Metrics  0 Has Congestive Heart Failure:  No    Current as of 2 hours ago  1 Has Vascular Disease:  Yes    Current as of 2 hours ago  1 Has Hypertension:  Yes    Current as of 2 hours ago  2 Age:  77    Current as of 2 hours ago  0 Has Diabetes:  No    Current as of 2 hours ago  2 Had Stroke:  No  Had TIA:  No  Had Thromboembolism:  Yes     Current as of 2 hours ago  1 Female:  Yes    Current as of 2 hours ago      Social History   Tobacco Use  Smoking Status Never  Smokeless Tobacco Never   BP Readings from Last 3 Encounters:  11/11/21 120/82  11/03/21 124/60  10/21/21 (!) 181/76    Pulse Readings from Last 3 Encounters:  11/11/21 86  11/03/21 60  10/21/21 61   Wt Readings from Last 3 Encounters:  11/11/21 198 lb (89.8 kg)  11/03/21 191 lb 6 oz (86.8 kg)  10/21/21 195 lb 8 oz (88.7 kg)   BMI Readings from Last 3 Encounters:  11/11/21 31.96 kg/m  11/03/21 30.89 kg/m  10/21/21 31.55 kg/m    Assessment/Interventions: Review of patient past medical history, allergies, medications, health status, including review of consultants reports, laboratory and other test data, was performed as part of comprehensive evaluation and provision of chronic care management services.   SDOH:  (Social Determinants of Health) assessments and interventions performed: No - done 8/23 SDOH Interventions    Flowsheet Row Clinical Support from 11/10/2021 in [place] at [place] Management from 01/22/2021 in [place] at [place] Management from 09/07/2020 in [place] at [place] Management from 05/28/2020 in [place] at [place] from 09/08/2016 in [place] at [place] Interventions Intervention Not Indicated -- -- -- --  Housing Interventions Intervention Not Indicated -- -- -- --  Transportation Interventions Intervention Not Indicated -- -- -- --  Depression Interventions/Treatment  -- -- -- -- --  [pt is being referred to PCP for further evaluation]  Financial Strain Interventions Intervention Not Indicated Other (Comment) Intervention Not Indicated Intervention Not Indicated  [Meds affordable] --  Physical Activity Interventions Intervention Not Indicated -- -- -- --  Stress Interventions Intervention Not Indicated Provide Counseling -- -- --  Social Connections Interventions Intervention Not Indicated -- -- -- --      SDOH Screenings   Food Insecurity: No Food Insecurity (11/10/2021)  Housing: Low Risk  (11/10/2021)   Transportation Needs: No Transportation Needs (11/10/2021)  Alcohol Screen: Low Risk  (11/10/2021)  Depression (PHQ2-9): Low Risk  (11/11/2021)  Financial Resource Strain: Low Risk  (11/10/2021)  Physical Activity: Inactive (11/10/2021)  Social Connections: Moderately Isolated (11/10/2021)  Stress: No Stress Concern Present (11/10/2021)  Tobacco Use: Low Risk  (11/11/2021)    CCM Care Plan  Allergies  Allergen Reactions   Amoxicillin Swelling    Face hands   Penicillins Swelling and Other (See Comments)    Has patient had a PCN reaction causing immediate rash, facial/tongue/throat swelling, SOB or lightheadedness with hypotension: Yes Has patient had a PCN reaction causing severe rash involving mucus membranes or skin necrosis: No Has patient had a PCN reaction that required hospitalization No phalosporin use.   Valium [Diazepam] Other (See Comments)    Tremor.  Tolerates alprazolam.   Imdur [Isosorbide Nitrate]     Headache at 53m dose    Medications Reviewed Today     [facility]ton [facility] on 11/24/21 at 0807  Med List Status: <None>   Medication Order Taking? Sig Documenting Provider Last Dose Status Informant  ALPRAZolam (XANAX) 0.5 MG tablet [id number] TAKE ONE TABLET BY MOUTH twice daily AS NEEDED FOR ANXIETY DTonia Ghent MD Taking Active   atorvastatin (LIPITOR) 80 MG tablet [id number] TAKE ONE TABLET BY MOUTH EVERY EVENING DTonia Ghent MD Taking Active   Cholecalciferol (VITAMIN D) 2000 units CAPS [id number] Take 2,000 Units by mouth daily. [provider] Taking Active Self  colchicine 0.6 MG tablet [id number] Take 1 tablet (0.6 mg total) by mouth daily as needed.  Patient not taking: Reported on 11/24/2021   DTonia Ghent MD Not Taking Active   diltiazem ([facility]) 180 MG 24 hr capsule [id number] TAKE ONE CAPSULE BY MOUTH ONCE DAILY DElsie Stain  S, MD Taking Active   ELIQUIS 5 MG TABS tablet [id number]  Yes TAKE ONE TABLET BY MOUTH TWICE DAILY [name], MD Taking Active   fluticasone [facility]) 50 MCG/ACT nasal spray [id number] Yes USE TWO SPRAYS in each nostril ONCE DAILY [name], MD Taking Active Self  fluticasone (FLOVENT HFA) 110 MCG/ACT inhaler [id number] Yes Inhale 2 puffs into the lungs in the morning and at bedtime. Rinse after use [name], MD Taking Active   furosemide (LASIX) 20 MG tablet [id number] Yes TAKE ONE TABLET BY MOUTH daily AS NEEDED FOR fluid [name], MD Taking Active   hydrocortisone (ANUSOL-HC) 25 MG suppository [id number] Yes Place 1 suppository (25 mg total) rectally 2 (two) times daily as needed for hemorrhoids or anal itching. [name], MD Taking Active   lansoprazole (PREVACID) 30 MG capsule [id number] Yes TAKE ONE CAPSULE BY MOUTH TWICE DAILY [name], MD Taking Active   lisinopril (ZESTRIL) 5 MG tablet [id number] No Stopped as of 11/11/21  Patient not taking: Reported on 11/24/2021   [name], MD Not Taking Active   neomycin-polymyxin b-dexamethasone (MAXITROL) 3.5-10000-0.1 OINT [id number] Yes Place 1 Application into the right eye 2 (two) times daily. [provider] Taking Active   sertraline (ZOLOFT) 100 MG tablet [id number] Yes TAKE 1 AND 1/2 TABLETS BY MOUTH ONCE DAILY [name], MD Taking Active   triamcinolone cream (KENALOG) 0.1 % [id number] Yes apply one application TOPICALLY twice daily [name], MD Taking Active   vitamin B-12 (CYANOCOBALAMIN) 1000 MCG tablet [id number] Yes Take 1,000 mcg by mouth daily. [provider] Taking Active Self            Patient Active Problem List   Diagnosis Date Noted   Abnormal MRI 11/15/2021   Urinary incontinence 11/15/2021   Gait abnormality 11/03/2021   Coordination impairment 11/03/2021   DOE (dyspnea on exertion) 08/04/2021   External hemorrhoid 05/10/2021   Sigmoid diverticulitis 02/11/2021   BRBPR (bright red blood per rectum)  02/10/2021   Change in stool 01/31/2021   Moderate aortic stenosis 11/20/2020   Other social stressor 09/27/2020   Cecal polyp    Diverticulosis    GI bleed 07/29/2020   Foot callus 12/30/2019   Gout 08/21/2017   Vision loss 07/30/2017   Headache 07/30/2017   Syncope 12/08/2016   Atrial fibrillation ([place]) 12/05/2016   Advance care planning 09/09/2016   Dizziness 01/20/2016   Fatigue 01/20/2016   Arthritis 04/13/2015   Rhinitis 02/25/2015   Osteoarthritis of both knees 11/13/2014   Primary osteoarthritis of both knees 11/13/2014   Cough 11/13/2013   Flank pain 11/21/2012   GERD (gastroesophageal reflux disease)    Back pain 05/04/2011   Hand pain 10/27/2010   Osteoporosis 08/23/2010   Dysuria 06/07/2010   UNSPECIFIED VITAMIN D DEFICIENCY 09/23/2008   DEPRESSION/ANXIETY 09/23/2008   Obesity (BMI 30.0-34.9) 06/21/2006   CORONARY ARTERY DISEASE, S/P PTCA 06/21/2006   Extrinsic asthma 06/21/2006   Essential hypertension 06/13/1999   Hyperlipidemia 03/15/1999    Immunization History  Administered Date(s) Administered   Fluad Quad(high Dose 65+) 03/18/2019, 12/26/2019, 01/29/2021   Influenza Split 12/23/2011   Influenza,inj,Quad PF,6+ Mos 01/30/2014, 04/09/2015, 04/08/2016, 12/05/2016   Pneumococcal Conjugate-13 11/13/2014   Pneumococcal Polysaccharide-23 10/05/2009   Td 09/23/2008   Unspecified SARS-COV-2 Vaccination 05/13/2019, 06/03/2019    Conditions to be addressed/monitored:  Hypertension, Hyperlipidemia, Atrial Fibrillation, Coronary Artery Disease, GERD, Anxiety, and Gout  Care Plan : [place]  Updates made by Charlton Haws, RPH since 11/24/2021 12:00 AM     Problem: Hypertension, Hyperlipidemia, Atrial Fibrillation, Coronary Artery Disease, GERD, Anxiety, and Gout   Priority: High     Long-Range Goal: Disease Management   Start Date: 09/07/2020  Expected End Date: 11/25/2022  This Visit's Progress: On track  Recent Progress: Not on  track  Priority: High  Note:   Current Barriers:  Many excess PRN medications on hand  Pharmacist Clinical Goal(s):  Patient will contact provider office for questions/concerns as evidenced notation of same in electronic health record through collaboration with PharmD and provider.   Interventions: 1:1 collaboration with [name], MD regarding development and update of comprehensive plan of care as evidenced by provider attestation and co-signature Inter-disciplinary care team collaboration (see longitudinal plan of care) Comprehensive medication review performed; medication list updated in electronic medical record  Hypertension (BP goal <140/90) -Controlled -Current home readings: n/a -Denies hypotensive/hypertensive symptoms -Current treatment: Lisinopril 5 mg (stopped 8/31 per PCP) Diltiazem CD 180 mg daily - Appropriate, Effective, Safe, Accessible Furosemide 20 mg daily PRN - Appropriate, Effective, Safe, Accessible -Medications previously tried: n/a  -Educated on BP goals and benefits of medications for prevention of heart attack, stroke and kidney damage; Proper BP monitoring technique; Symptoms of hypotension and importance of maintaining adequate hydration; -Counseled to monitor BP at home daily -Recommended to continue current medication  Hyperlipidemia / CAD (LDL goal < 70) -Not ideally controlled - LDL 112 (10/2020), previously 80. Based on refill dates, pt was probably not compliant with atorvastatin for the month preceding labwork; she is now compliant with atorvastatin in daily pill packs -Hx CAD -Current treatment: Atorvastatin 80 mg daily - Appropriate, Query Effective -Medications previously tried: n/a  -Educated on Cholesterol goals; Benefits of statin for ASCVD risk reduction; -Recommended to continue current medication  Atrial Fibrillation (Goal: prevent stroke and major bleeding) -Controlled -CHADSVASC: 7; hx GI bleed/BRBPR -Current  treatment: Diltiazem CD 180 mg daily - Appropriate, Effective, Safe, Accessible Eliquis 5 mg BID -Appropriate, Effective, Safe, Accessible -Medications previously tried: n/a -Counseled on importance of adherence to anticoagulant exactly as prescribed; bleeding risk associated with Eliquis and importance of self-monitoring for signs/symptoms of bleeding; avoidance of NSAIDs due to increased bleeding risk with anticoagulants; -Recommended to continue current medication  Asthma (Goal: control symptoms and prevent exacerbations) -Controlled - per pt report; she does not have albuterol because she had a reaction to it previously (sore throat, dizziness) -Pulmonary function testing: none in chart -Exacerbations requiring treatment in last 6 months: 0 -Current treatment  Flovent 110 mcg/act 2 puffs BID - Appropriate, Effective, Safe, Accessible Flonase nasal spray PRN -Appropriate, Effective, Safe, Accessible -Medications previously tried: albuterol  -Patient reports consistent use of maintenance inhaler -Frequency of rescue inhaler use: n/a -Counseled on Proper inhaler technique; Benefits of consistent maintenance inhaler use -Recommended to continue current medication  Depression/Anxiety (Goal: manage symptoms) -Controlled -PHQ9: 0 (11/11/21) -GAD7: not on file -Connected with PCP for mental health support -Current treatment: Alprazolam 0.5 mg BID PRN - Appropriate, Effective, Safe, Accessible Sertraline 100 mg - 1.5 tab daily -Appropriate, Effective, Safe, Accessible -Medications previously tried/failed: n/a -Educated on Benefits of medication for symptom control -Recommended to continue current medication  Gout (Goal: Prevent gout flares) -Controlled -Last Gout Flare: 10/2021; no recent uric acid on file -Current treatment  Colchicine 0.6 mg PRN -Appropriate, Effective, Safe, Accessible -Medications previously tried: n/a  -We discussed:  Counseled patient on low purine diet plan.  Counseled patient to  reduce consumption of high-fructose corn syrup, sweetened soft drinks, fruit juices, meat, and seafood. -Recommended to continue current medication  GERD (Goal: minimize symptoms of reflux ) -Controlled -Current treatment  Lansoprazole 30 mg BID - Appropriate, Effective, Safe, Accessible -Medications previously tried: none reported  -Hx of Bleeds/ulcers: Yes -Recommended to continue current medication  Medication management -Reviewed medication bottles/PRNs. Patient did not bring pill packs but reports she is compliant with those. She has a lot of extra albuterol, flonase, triamcinolone creams and old bottles of expired medications -Removed expired medications from patient's supply. Counseled on using pill packs for maintenance medications and reviewed which of her medications are PRN - lisinopril, alprazolam, colchicine, furosemide   Patient Goals/Self-Care Activities Patient will:  - take medications as prescribed as evidenced by patient report and record review focus on medication adherence by pill packs check blood pressure daily, document, and provide at future appointments       Medication Assistance: None required.  Patient affirms current coverage meets needs.  Compliance/Adherence/Medication fill history: Care Gaps: None  Star-Rating Drugs: Atorvastatin - PDC 100% Lisinopril - PDC 100%  Medication Access: Within the past 30 days, how often has patient missed a dose of medication? 0 Is a pillbox or other method used to improve adherence? Yes  Factors that may affect medication adherence? lack of understanding of disease management Are meds synced by current pharmacy? Yes  Are meds delivered by current pharmacy? Yes  Does patient experience delays in picking up medications due to transportation concerns? No   [facility] Reviewed: Is patient disadvantaged to use [facility]?: No  Current Rx insurance plan: [employer] Name and location  of Current pharmacy:  [facility] - [place], [place] - [address] [place] [address] Phone: [phone] Fax: [phone]  [facility] ([place], [place] [address] Phone: [phone] Fax: [phone]  [place], [place] - [place] [address] Phone: [phone] Fax: [phone]  [facility] services reviewed with patient today?: Yes  30-day Packs (Delivery 11/17/21) Sertraline 170m  take 1 1/2 tablets daily breakfast, evening meal  Lansoprazole 378mTake 1 tablet at breakfast and 1 tablet evening meal Diltiazem 18066m4 hr capsule 1 capsule by mouth daily breakfast  Eliquis 5mg38mke 1 tablet 2 times daily breakfast and evening meal Atorvastatin 80mg59mlet 1 tablet by mouth daily breakfast   Vial Medications:  Flovent 110/mcg 2 puffs in the morning and bedtime  Patient declined the following medications: on hold or excess supply Fluticasone nasal spray 50 mcg 2 sprays in each nostril daily Albuterol HFA 180 inhaler 2 puffs daily as needed - not taking as it was making her throat feel like it is closing - hospital told her to throw it away back in December Lisinopril 5mg  52me 1 tablet daily - may take extra dose if BP over 160/90, PRN in vial Triamcinolone Acetonide 0.1% apply twice daily - Alprazolam 0.5 mg - 1 tab BID - Pt has over 150 remaining Vitamin B-12 1000mcg 68m 1,000 by mouth daily breakfast Vitamin D3 25mcg  71m 1 tablet daily breakfast  Care Plan and Follow Up Patient Decision:  Patient agrees to Care Plan and Follow-up.  Plan: Telephone follow up appointment with care management team member scheduled for:  1 month  Harlow Carrizales [name], BCACP Clinical  Pharmacist [place] at [facility] [phone]

## 2021-11-17 DIAGNOSIS — Z7901 Long term (current) use of anticoagulants: Secondary | ICD-10-CM | POA: Diagnosis not present

## 2021-11-17 DIAGNOSIS — M79671 Pain in right foot: Secondary | ICD-10-CM | POA: Diagnosis not present

## 2021-11-17 DIAGNOSIS — L539 Erythematous condition, unspecified: Secondary | ICD-10-CM | POA: Diagnosis not present

## 2021-11-17 DIAGNOSIS — R269 Unspecified abnormalities of gait and mobility: Secondary | ICD-10-CM | POA: Diagnosis not present

## 2021-11-17 DIAGNOSIS — R296 Repeated falls: Secondary | ICD-10-CM | POA: Diagnosis not present

## 2021-11-17 DIAGNOSIS — I4891 Unspecified atrial fibrillation: Secondary | ICD-10-CM | POA: Diagnosis not present

## 2021-11-17 DIAGNOSIS — M109 Gout, unspecified: Secondary | ICD-10-CM | POA: Diagnosis not present

## 2021-11-17 DIAGNOSIS — I10 Essential (primary) hypertension: Secondary | ICD-10-CM | POA: Diagnosis not present

## 2021-11-23 ENCOUNTER — Telehealth: Payer: Self-pay | Admitting: Family Medicine

## 2021-11-23 DIAGNOSIS — M109 Gout, unspecified: Secondary | ICD-10-CM

## 2021-11-23 MED ORDER — COLCHICINE 0.6 MG PO TABS
0.6000 mg | ORAL_TABLET | Freq: Every day | ORAL | 1 refills | Status: DC | PRN
Start: 2021-11-23 — End: 2021-12-21

## 2021-11-23 NOTE — Addendum Note (Signed)
Addended by: [name] on: 11/23/2021 05:07 PM   Modules accepted: Orders

## 2021-11-23 NOTE — Telephone Encounter (Signed)
Please check with patient.  I sent the colchicine rx.  Hold atorvastatin while using colchicine.  Please check on the  pain medicine- she doesn't have an active rx on her file.  Thanks.

## 2021-11-23 NOTE — Telephone Encounter (Signed)
I see she has colchicine but I do not see anything else; is there something else she needs to be taking?

## 2021-11-23 NOTE — Telephone Encounter (Signed)
Pt called in stated she need her Gout and pain medication unable to give  me the name of medication . Stated PCP knows . Her left foot is hurting . Please Advise [phone]

## 2021-11-24 NOTE — Patient Instructions (Signed)
Visit Information  Phone number for Pharmacist: [phone]   Goals Addressed   None     Care Plan : [place]  Updates made by Charlton Haws, RPH since 11/24/2021 12:00 AM     Problem: Hypertension, Hyperlipidemia, Atrial Fibrillation, Coronary Artery Disease, GERD, Anxiety, and Gout   Priority: High     Long-Range Goal: Disease Management   Start Date: 09/07/2020  Expected End Date: 11/25/2022  This Visit's Progress: On track  Recent Progress: Not on track  Priority: High  Note:   Current Barriers:  Many excess PRN medications on hand  Pharmacist Clinical Goal(s):  Patient will contact provider office for questions/concerns as evidenced notation of same in electronic health record through collaboration with PharmD and provider.   Interventions: 1:1 collaboration with [name], MD regarding development and update of comprehensive plan of care as evidenced by provider attestation and co-signature Inter-disciplinary care team collaboration (see longitudinal plan of care) Comprehensive medication review performed; medication list updated in electronic medical record  Hypertension (BP goal <140/90) -Controlled -Current home readings: n/a -Denies hypotensive/hypertensive symptoms -Current treatment: Lisinopril 5 mg (stopped 8/31 per PCP) Diltiazem CD 180 mg daily - Appropriate, Effective, Safe, Accessible Furosemide 20 mg daily PRN - Appropriate, Effective, Safe, Accessible -Medications previously tried: n/a  -Educated on BP goals and benefits of medications for prevention of heart attack, stroke and kidney damage; Proper BP monitoring technique; Symptoms of hypotension and importance of maintaining adequate hydration; -Counseled to monitor BP at home daily -Recommended to continue current medication  Hyperlipidemia / CAD (LDL goal < 70) -Not ideally controlled - LDL 112 (10/2020), previously 80. Based on refill dates, pt was probably not  compliant with atorvastatin for the month preceding labwork; she is now compliant with atorvastatin in daily pill packs -Hx CAD -Current treatment: Atorvastatin 80 mg daily - Appropriate, Query Effective -Medications previously tried: n/a  -Educated on Cholesterol goals; Benefits of statin for ASCVD risk reduction; -Recommended to continue current medication  Atrial Fibrillation (Goal: prevent stroke and major bleeding) -Controlled -CHADSVASC: 7; hx GI bleed/BRBPR -Current treatment: Diltiazem CD 180 mg daily - Appropriate, Effective, Safe, Accessible Eliquis 5 mg BID -Appropriate, Effective, Safe, Accessible -Medications previously tried: n/a -Counseled on importance of adherence to anticoagulant exactly as prescribed; bleeding risk associated with Eliquis and importance of self-monitoring for signs/symptoms of bleeding; avoidance of NSAIDs due to increased bleeding risk with anticoagulants; -Recommended to continue current medication  Asthma (Goal: control symptoms and prevent exacerbations) -Controlled - per pt report; she does not have albuterol because she had a reaction to it previously (sore throat, dizziness) -Pulmonary function testing: none in chart -Exacerbations requiring treatment in last 6 months: 0 -Current treatment  Flovent 110 mcg/act 2 puffs BID - Appropriate, Effective, Safe, Accessible Flonase nasal spray PRN -Appropriate, Effective, Safe, Accessible -Medications previously tried: albuterol  -Patient reports consistent use of maintenance inhaler -Frequency of rescue inhaler use: n/a -Counseled on Proper inhaler technique; Benefits of consistent maintenance inhaler use -Recommended to continue current medication  Depression/Anxiety (Goal: manage symptoms) -Controlled -PHQ9: 0 (11/11/21) -GAD7: not on file -Connected with PCP for mental health support -Current treatment: Alprazolam 0.5 mg BID PRN - Appropriate, Effective, Safe, Accessible Sertraline 100 mg -  1.5 tab daily -Appropriate, Effective, Safe, Accessible -Medications previously tried/failed: n/a -Educated on Benefits of medication for symptom control -Recommended to continue current medication  Gout (Goal: Prevent gout flares) -Controlled -Last Gout Flare: 10/2021; no recent uric acid on file -Current treatment  Colchicine 0.6 mg PRN -Appropriate, Effective, Safe, Accessible -Medications previously tried: n/a  -We discussed:  Counseled patient on low purine diet plan. Counseled patient to reduce consumption of high-fructose corn syrup, sweetened soft drinks, fruit juices, meat, and seafood. -Recommended to continue current medication  GERD (Goal: minimize symptoms of reflux ) -Controlled -Current treatment  Lansoprazole 30 mg BID - Appropriate, Effective, Safe, Accessible -Medications previously tried: none reported  -Hx of Bleeds/ulcers: Yes -Recommended to continue current medication  Medication management -Reviewed medication bottles/PRNs. Patient did not bring pill packs but reports she is compliant with those. She has a lot of extra albuterol, flonase, triamcinolone creams and old bottles of expired medications -Removed expired medications from patient's supply. Counseled on using pill packs for maintenance medications and reviewed which of her medications are PRN - lisinopril, alprazolam, colchicine, furosemide   Patient Goals/Self-Care Activities Patient will:  - take medications as prescribed as evidenced by patient report and record review focus on medication adherence by pill packs check blood pressure daily, document, and provide at future appointments       Patient verbalizes understanding of instructions and care plan provided today and agrees to view in [place]. Active MyChart status and patient understanding of how to access instructions and care plan via MyChart confirmed with patient.    Telephone follow up appointment with pharmacy team member scheduled for:  3 months  [name], PharmD, [facility] Clinical Pharmacist [facility] [phone]

## 2021-11-26 ENCOUNTER — Telehealth: Payer: Self-pay

## 2021-11-26 NOTE — Telephone Encounter (Signed)
Spoke with patient and she does not take any pain medication. She was just needing the colchicine. Patient received this yesterday in the mail.

## 2021-11-26 NOTE — Patient Outreach (Signed)
  Care Coordination   Initial Visit Note   11/26/2021 Name: [name] MRN: [id number] DOB: 1935/09/02  [name] is a 86 y.o. year old female who sees [name], MD for primary care. Return call to patient after receiving voicemail message from her. I spoke with  [name] by phone today.   What matters to the patients health and wellness today?  Patient states she received unable to reach letter mailed to her by RN CM and is interested in services. Appt scheduled with assigned RN CM. Patient also inquiring about physical therapy services-states she thought MD set her up but can't remember. Encouraged patient to call MD office to follow up. Advised patient that [facility] does not provide those services.     Goals Addressed               This Visit's Progress     Needs therapy to get stronger (pt-stated)        Care Coordination Interventions: Advised patient to discuss therapy with MD Provided education to patient re: [facility] services          SDOH assessments and interventions completed:  Yes  SDOH Interventions Today    Flowsheet Row Most Recent Value  SDOH Interventions   Food Insecurity Interventions Intervention Not Indicated  Transportation Interventions Intervention Not Indicated        Care Coordination Interventions Activated:  Yes  Care Coordination Interventions:  Yes, provided   Follow up plan: Follow up call scheduled for 12/01/21-9am    Encounter Outcome:  Pt. Visit Completed   Enzo Montgomery, RN,BSN,CCM [place] Management Telephonic Care Management Coordinator Direct Phone: [phone] Toll Free: [phone] Fax: [phone]

## 2021-12-01 ENCOUNTER — Ambulatory Visit: Payer: Self-pay

## 2021-12-01 NOTE — Patient Outreach (Signed)
  Care Coordination   12/01/2021 Name: SHANEESE TAIT MRN: [id number] DOB: [birth year]   Care Coordination Outreach Attempts:  An unsuccessful telephone outreach was attempted for a scheduled appointment today.  Follow Up Plan:  Additional outreach attempts will be made to offer the patient care coordination information and services.   Encounter Outcome:  No Answer  Care Coordination Interventions Activated:  No   Care Coordination Interventions:  No, not indicated    Quinn Plowman RN,BSN,CCM [place] [phone] direct line

## 2021-12-02 ENCOUNTER — Telehealth: Payer: Medicare HMO

## 2021-12-02 DIAGNOSIS — H353211 Exudative age-related macular degeneration, right eye, with active choroidal neovascularization: Secondary | ICD-10-CM | POA: Diagnosis not present

## 2021-12-02 DIAGNOSIS — R6889 Other general symptoms and signs: Secondary | ICD-10-CM | POA: Diagnosis not present

## 2021-12-02 DIAGNOSIS — H353122 Nonexudative age-related macular degeneration, left eye, intermediate dry stage: Secondary | ICD-10-CM | POA: Diagnosis not present

## 2021-12-03 ENCOUNTER — Telehealth: Payer: Self-pay | Admitting: *Deleted

## 2021-12-03 ENCOUNTER — Telehealth: Payer: Self-pay

## 2021-12-03 NOTE — Progress Notes (Unsigned)
Chronic Care Management Pharmacy Assistant   Name: [name]  MRN: [id number] DOB: 24-Dec-1935  Reason for Encounter: CCM (Medication Adherence and Delivery Coordination)  Recent office visits:  None since last CCM contact  Recent consult visits:  None since last CCM contact  Hospital visits:  None since last CCM contact  Medications: Outpatient Encounter Medications as of 12/03/2021  Medication Sig   ALPRAZolam (XANAX) 0.5 MG tablet TAKE ONE TABLET BY MOUTH twice daily AS NEEDED FOR ANXIETY   atorvastatin (LIPITOR) 80 MG tablet TAKE ONE TABLET BY MOUTH EVERY EVENING   Cholecalciferol (VITAMIN D) 2000 units CAPS Take 2,000 Units by mouth daily.   colchicine 0.6 MG tablet Take 1 tablet (0.6 mg total) by mouth daily as needed. (Patient not taking: Reported on 11/24/2021)   diltiazem (CARDIZEM CD) 180 MG 24 hr capsule TAKE ONE CAPSULE BY MOUTH ONCE DAILY   ELIQUIS 5 MG TABS tablet TAKE ONE TABLET BY MOUTH TWICE DAILY   fluticasone (FLONASE) 50 MCG/ACT nasal spray USE TWO SPRAYS in each nostril ONCE DAILY   fluticasone (FLOVENT HFA) 110 MCG/ACT inhaler Inhale 2 puffs into the lungs in the morning and at bedtime. Rinse after use   furosemide (LASIX) 20 MG tablet TAKE ONE TABLET BY MOUTH daily AS NEEDED FOR fluid   hydrocortisone (ANUSOL-HC) 25 MG suppository Place 1 suppository (25 mg total) rectally 2 (two) times daily as needed for hemorrhoids or anal itching.   lansoprazole (PREVACID) 30 MG capsule TAKE ONE CAPSULE BY MOUTH TWICE DAILY   lisinopril (ZESTRIL) 5 MG tablet Stopped as of 11/11/21 (Patient not taking: Reported on 11/24/2021)   neomycin-polymyxin b-dexamethasone (MAXITROL) 3.5-10000-0.1 OINT Place 1 Application into the right eye 2 (two) times daily.   sertraline (ZOLOFT) 100 MG tablet TAKE 1 AND 1/2 TABLETS BY MOUTH ONCE DAILY   triamcinolone cream (KENALOG) 0.1 % apply one application TOPICALLY twice daily   vitamin B-12 (CYANOCOBALAMIN) 1000 MCG tablet Take 1,000  mcg by mouth daily.   No facility-administered encounter medications on file as of 12/03/2021.   BP Readings from Last 3 Encounters:  11/11/21 120/82  11/03/21 124/60  10/21/21 (!) 181/76    Lab Results  Component Value Date   HGBA1C 5.9 10/17/2011    No OVs, Consults, or hospital visits since last care coordination call / Pharmacist visit. No medication changes indicated  Last adherence delivery date:  11/12/2021      Patient is due for next adherence delivery on: 12/15/2021  {Med Review:25223}  This delivery to include: Adherence Packaging  30 Days  Packs: Sertraline '100mg'$   take 1 1/2 tablets daily breakfast, evening meal  Lansoprazole '30mg'$  Take 1 tablet at breakfast and 1 tablet evening meal Diltiazem '180mg'$  24 hr capsule 1 capsule by mouth daily breakfast  Atorvastatin '80mg'$  Tablet 1 tablet by mouth daily breakfast Eliquis '5mg'$  take 1 tablet 2 times daily breakfast and evening meal   Vial Medications:  Fluticasone propionate 110/mcg 2 puffs in the morning and bedtime - still using that one Colchicine 0.6 mg 1 tablet daily as needed  Patient declined the following medications this month:  Any concerns about your medications? {yes/no:20286}  How often do you forget or accidentally miss a dose? Rarely  Do you use a pillbox? Yes  Is patient in packaging {yes/no:20286}  If yes  What is the date on your next pill pack?  Any concerns or issues with your packaging?  No refill request needed.  {Delivery HMCN:47096} 12/15/21  Recent blood  pressure readings are as follows:***   Annual wellness visit in last year? No Most Recent BP reading: 140/80 on 08/02/2021   [name], CPP notified   [name], [place] Assistant [phone]  Cycle dispensing form sent to {Medreview:27653} for review.

## 2021-12-03 NOTE — Chronic Care Management (AMB) (Signed)
  Care Coordination  Outreach Note  12/03/2021 Name: JOLLEEN SEMAN MRN: [id number] DOB: 05-25-1935   Care Coordination Outreach Attempts: An unsuccessful telephone outreach was attempted today to offer the patient information about available care coordination services as a benefit of their health plan.   Follow Up Plan:  Additional outreach attempts will be made to offer the patient care coordination information and services.   Encounter Outcome:  No Answer  Julian Hy, [place] Direct Dial: [phone]

## 2021-12-11 DIAGNOSIS — I4891 Unspecified atrial fibrillation: Secondary | ICD-10-CM | POA: Diagnosis not present

## 2021-12-11 DIAGNOSIS — I259 Chronic ischemic heart disease, unspecified: Secondary | ICD-10-CM

## 2021-12-11 DIAGNOSIS — I10 Essential (primary) hypertension: Secondary | ICD-10-CM | POA: Diagnosis not present

## 2021-12-11 DIAGNOSIS — E785 Hyperlipidemia, unspecified: Secondary | ICD-10-CM

## 2021-12-11 DIAGNOSIS — J4520 Mild intermittent asthma, uncomplicated: Secondary | ICD-10-CM

## 2021-12-16 NOTE — Chronic Care Management (AMB) (Signed)
  Care Coordination  Outreach Note  12/16/2021 Name: [name] MRN: [id number] DOB: 27-Feb-1936   Care Coordination Outreach Attempts: A second unsuccessful outreach was attempted today to offer the patient with information about available care coordination services as a benefit of their health plan.     Rescheduling   Follow Up Plan:  Additional outreach attempts will be made to offer the patient care coordination information and services.   Encounter Outcome:  No Answer  Julian Hy, [place] Direct Dial: [phone]

## 2021-12-18 ENCOUNTER — Other Ambulatory Visit: Payer: Self-pay

## 2021-12-18 ENCOUNTER — Emergency Department: Payer: Medicare HMO

## 2021-12-18 ENCOUNTER — Emergency Department
Admission: EM | Admit: 2021-12-18 | Discharge: 2021-12-18 | Disposition: A | Discharge status: Home / Self Care | Payer: Medicare HMO | Attending: Emergency Medicine | Admitting: Emergency Medicine

## 2021-12-18 DIAGNOSIS — Z79899 Other long term (current) drug therapy: Secondary | ICD-10-CM | POA: Insufficient documentation

## 2021-12-18 DIAGNOSIS — R1032 Left lower quadrant pain: Secondary | ICD-10-CM | POA: Diagnosis not present

## 2021-12-18 DIAGNOSIS — I700 Atherosclerosis of aorta: Secondary | ICD-10-CM | POA: Diagnosis not present

## 2021-12-18 DIAGNOSIS — R109 Unspecified abdominal pain: Secondary | ICD-10-CM

## 2021-12-18 DIAGNOSIS — Z7901 Long term (current) use of anticoagulants: Secondary | ICD-10-CM | POA: Diagnosis not present

## 2021-12-18 DIAGNOSIS — I10 Essential (primary) hypertension: Secondary | ICD-10-CM | POA: Diagnosis not present

## 2021-12-18 DIAGNOSIS — K8020 Calculus of gallbladder without cholecystitis without obstruction: Secondary | ICD-10-CM | POA: Diagnosis not present

## 2021-12-18 LAB — TYPE AND SCREEN
ABO/RH(D): A POS
Antibody Screen: NEGATIVE

## 2021-12-18 LAB — CBC
HCT: 38.1 % (ref 36.0–46.0)
Hemoglobin: 11.8 g/dL — ABNORMAL LOW (ref 12.0–15.0)
MCH: 27.3 pg (ref 26.0–34.0)
MCHC: 31 g/dL (ref 30.0–36.0)
MCV: 88.2 fL (ref 80.0–100.0)
Platelets: 173 10*3/uL (ref 150–400)
RBC: 4.32 MIL/uL (ref 3.87–5.11)
RDW: 14.3 % (ref 11.5–15.5)
WBC: 5.5 10*3/uL (ref 4.0–10.5)
nRBC: 0 % (ref 0.0–0.2)

## 2021-12-18 LAB — TROPONIN I (HIGH SENSITIVITY)
Troponin I (High Sensitivity): 12 ng/L (ref <18)
Troponin I (High Sensitivity): 11 ng/L (ref <18)

## 2021-12-18 LAB — COMPREHENSIVE METABOLIC PANEL
ALT: 28 U/L (ref 0–44)
AST: 37 U/L (ref 15–41)
Albumin: 3.6 g/dL (ref 3.5–5.0)
Alkaline Phosphatase: 96 U/L (ref 38–126)
Anion gap: 3 — ABNORMAL LOW (ref 5–15)
BUN: 12 mg/dL (ref 8–23)
CO2: 32 mmol/L (ref 22–32)
Calcium: 8.8 mg/dL — ABNORMAL LOW (ref 8.9–10.3)
Chloride: 103 mmol/L (ref 98–111)
Creatinine, Ser: 0.62 mg/dL (ref 0.44–1.00)
GFR, Estimated: >60 mL/min (ref >60)
Glucose, Bld: 95 mg/dL (ref 70–99)
Potassium: 4.3 mmol/L (ref 3.5–5.1)
Sodium: 138 mmol/L (ref 135–145)
Total Bilirubin: 0.6 mg/dL (ref 0.3–1.2)
Total Protein: 7.2 g/dL (ref 6.5–8.1)

## 2021-12-18 MED ORDER — IOHEXOL 300 MG/ML  SOLN
100.0000 mL | Freq: Once | INTRAMUSCULAR | Status: AC | PRN
  Administered 2021-12-18: 100 mL via INTRAVENOUS

## 2021-12-18 NOTE — ED Notes (Signed)
Called ride for pt at this time- [name] coming to get pt.

## 2021-12-18 NOTE — ED Provider Notes (Addendum)
[facility] Provider Note    Event Date/Time   First MD Initiated Contact with Patient 12/18/21 1130     (approximate)   History   Abdominal Pain   HPI  [name] is a 86 y.o. female who comes in with concerns for hypertension as well as abdominal pain.  Patient reports that she woke up this morning her blood pressure was high so she took for 5 mg of lisinopril and that she was concerned given her blood pressure was still high.  She reports taking Eliquis, atorvastatin and lisinopril.  Patient also reports having some dark stool yesterday but she is not sure if it was just related to what she ate.  She reports that the stool seems to be more formed today but she continues to have some left lower quadrant abdominal discomfort.  She reports that her current bowel movements have been more brown in nature.  She denies any falls hitting her head chest pain shortness of breath or other concerns. She denies any headaches, CP, sob or any other concerns.   Physical Exam   Triage Vital Signs: ED Triage Vitals  Enc Vitals Group     BP 12/18/21 0947 (!) 164/82     Pulse Rate 12/18/21 0946 (!) 58     Resp 12/18/21 0947 18     Temp 12/18/21 0946 98 F (36.7 C)     Temp Source 12/18/21 0946 Oral     SpO2 12/18/21 0947 96 %     Weight --      Height --      Head Circumference --      Peak Flow --      Pain Score 12/18/21 0948 6     Pain Loc --      Pain Edu? --      Excl. in [place]? --     Most recent vital signs: Vitals:   12/18/21 0947 12/18/21 1135  BP: (!) 164/82 (!) 164/72  Pulse:  (!) 50  Resp: 18 16  Temp:    SpO2: 96% 98%     General: Awake, no distress.  CV:  Good peripheral perfusion.  Resp:  Normal effort.  Abd:  No distention. Tender left LQ Other:  Brown stool.  Hemoccult positive.  Equal strength in arms and legs.  Cranial nerves are intact.   ED Results / Procedures / Treatments   Labs (all labs ordered are listed, but only  abnormal results are displayed) Labs Reviewed  COMPREHENSIVE METABOLIC PANEL - Abnormal; Notable for the following components:      Result Value   Calcium 8.8 (*)    Anion gap 3 (*)    All other components within normal limits  CBC - Abnormal; Notable for the following components:   Hemoglobin 11.8 (*)    All other components within normal limits  POC OCCULT BLOOD, ED  TYPE AND SCREEN     EKG  My interpretation of EKG:  Sinus bradycardia rate of 50 without any ST elevation except for maybe slightly in the inferior leads with normal intervals and T wave inversion in V2  RADIOLOGY I have reviewed the CT personally and interpreted no evidence of any free air  PROCEDURES:  Critical Care performed: No  .1-3 Lead EKG Interpretation  Performed by: Vanessa [place], MD Authorized by: Vanessa [place], MD     Interpretation: abnormal     ECG rate:  40   ECG rate assessment: bradycardic  Rhythm: sinus bradycardia     Ectopy: none     Conduction: normal      MEDICATIONS ORDERED IN ED: Medications  iohexol (OMNIPAQUE) 300 MG/ML solution 100 mL (100 mLs Intravenous Contrast Given 12/18/21 1211)     IMPRESSION / MDM / ASSESSMENT AND PLAN / ED COURSE  I reviewed the triage vital signs and the nursing notes.   Patient's presentation is most consistent with acute presentation with potential threat to life or bodily function.   Patient comes in with hypertension and left lower quadrant pain.  There was some concern for some dark stools however today there was no evidence of any melena.  Her CBC was ordered evaluate for anemia which shows hemoglobin of 11.8.  I reviewed her prior colonoscopies and she has had diverticulosis in the past thought to be causing her her bleeding.  She is also has known hemorrhoids.  Her CMP is reassuring.  Type and screen is A positive.  IMPRESSION: 1. Cholelithiasis without evidence of acute cholecystitis.    CT imaging as above.  On repeat abdominal  exam patient's abdomen is soft and nontender she denies any current pain or bleeding.  I will suspicion for cholecystitis do not feel that ultrasound is necessary at this time given her pain earlier where it was in the lower abdomen.  Pt requesting dc home- reports wanting to try to eat.   Called pt grandson  [name]- no answer  Attempted to call granddaughter with no answer   I considered admission for patient given she came in with report of dark stools but given no melena on examination patient is requesting to be discharged home.  I attempted to call family member to further discuss with them but there have been no answers.  I reviewed her discharge summary from November 2022 where she was admitted for rectal bleeding thought to be secondary to diverticulum and resolved without any issues.  Her hemoglobin is slightly low but where she was at 4 months ago.  She also has known hemorrhoids that is positive at the positive Hemoccult is related to that.  We discussed continuing her blood thinner for now and returning if she develops return of the black tarry stools or bright red blood per rectum otherwise she can follow-up outpatient with her GI doctor or return to the ER if she develops weakness or any other concerns but at this time she would prefer to be able to be discharged home.  She continues to deny any headaches, dizziness or any other concerns.  On repeat evaluation patient still would like to go home.  We discussed admission but given she reports no symptoms at this time she would like to go home.  She reports that she lives with family members.  She states that she will follow-up outpatient.  If her troponin is negative patient will be discharged  No abdominal pain on re-assessment- denies urinary symptoms or confusions and declined urine test  The patient is on the cardiac monitor to evaluate for evidence of arrhythmia and/or significant heart rate changes.      FINAL CLINICAL  IMPRESSION(S) / ED DIAGNOSES   Final diagnoses:  Hypertension, unspecified type  Abdominal pain, unspecified abdominal location     Rx / DC Orders   ED Discharge Orders     None        Note:  This document was prepared using Dragon voice recognition software and may include unintentional dictation errors.   Vanessa [place], MD  12/18/21 1511    Vanessa [place], MD 12/18/21 1525

## 2021-12-18 NOTE — ED Notes (Signed)
Chaperoned EDP for rectal exam 

## 2021-12-18 NOTE — Discharge Instructions (Addendum)
Her CT imaging was negative and her hemoglobin was slightly low but kind of around her baseline at 11.8.  If she develops black tarry stool or bright red blood per rectum or increasing weakness or confusion please return to the emergency room for recheck of hemoglobin.  Otherwise she should follow-up with her primary care doctor next week or GI for recheck of hemoglobin.  She should also discuss her Blood pressure with primary care doctor.  We do not like to lower the blood pressure acutely for a one-time elevated blood pressure but this can be rechecked with her primary care doctor in 2 days.

## 2021-12-18 NOTE — ED Triage Notes (Signed)
Pt in via EMS from home with c/o HTN. EMS reports hx of the same. Pt woke up this am, her pressure was high so she took '5mg'$  of lisinopril and then a repeat dose at 8:30. 180/88 initial BP, recheck 169/81. 96% RA, HR 59. Pt takes eloqiuos and atorvastatin and lisinopril.

## 2021-12-18 NOTE — ED Notes (Signed)
Pt denies dizziness, CP, SHOB, N/V/D, denies abdominal pain at this time- states she had several black stools and abdominal pain yesterday but when she had a BM in ED room presently it was brown. Pt AOX4, Ambulatory.

## 2021-12-18 NOTE — ED Notes (Signed)
Pt assisted to toilet and given PO challenge

## 2021-12-18 NOTE — ED Triage Notes (Signed)
Pt to ED via [facility] from home. Pt reports she has been having diarrhea and dark tarry stools yesterday. Pt on Pt reports she took her BP at home and was 201/79. Pt on Eliquis.

## 2021-12-21 ENCOUNTER — Encounter: Payer: Self-pay | Admitting: Family Medicine

## 2021-12-21 ENCOUNTER — Ambulatory Visit (INDEPENDENT_AMBULATORY_CARE_PROVIDER_SITE_OTHER): Payer: Medicare HMO | Admitting: Family Medicine

## 2021-12-21 VITALS — BP 136/70 | HR 76 | Temp 97.3°F | Ht 66.0 in | Wt 186.0 lb

## 2021-12-21 DIAGNOSIS — I10 Essential (primary) hypertension: Secondary | ICD-10-CM

## 2021-12-21 DIAGNOSIS — K8020 Calculus of gallbladder without cholecystitis without obstruction: Secondary | ICD-10-CM | POA: Diagnosis not present

## 2021-12-21 DIAGNOSIS — Z23 Encounter for immunization: Secondary | ICD-10-CM

## 2021-12-21 DIAGNOSIS — D649 Anemia, unspecified: Secondary | ICD-10-CM

## 2021-12-21 LAB — CBC WITH DIFFERENTIAL/PLATELET
Basophils Absolute: 0 10*3/uL (ref 0.0–0.1)
Basophils Relative: 0.5 % (ref 0.0–3.0)
Eosinophils Absolute: 0.1 10*3/uL (ref 0.0–0.7)
Eosinophils Relative: 1.3 % (ref 0.0–5.0)
HCT: 38.5 % (ref 36.0–46.0)
Hemoglobin: 12.6 g/dL (ref 12.0–15.0)
Lymphocytes Relative: 33.3 % (ref 12.0–46.0)
Lymphs Abs: 1.7 10*3/uL (ref 0.7–4.0)
MCHC: 32.8 g/dL (ref 30.0–36.0)
MCV: 86.6 fl (ref 78.0–100.0)
Monocytes Absolute: 0.4 10*3/uL (ref 0.1–1.0)
Monocytes Relative: 7.6 % (ref 3.0–12.0)
Neutro Abs: 2.8 10*3/uL (ref 1.4–7.7)
Neutrophils Relative %: 57.3 % (ref 43.0–77.0)
Platelets: 180 10*3/uL (ref 150.0–400.0)
RBC: 4.45 Mil/uL (ref 3.87–5.11)
RDW: 15.4 % (ref 11.5–15.5)
WBC: 5 10*3/uL (ref 4.0–10.5)

## 2021-12-21 LAB — IRON: Iron: 74 ug/dL (ref 42–145)

## 2021-12-21 NOTE — Progress Notes (Signed)
ER f/u.  HTN, dark stools.  SBP usually 140 or higher.  She had been checking BP at home.  Off lisinopril in the meantime unless BP was elevated.  She had taken it twice in the last week.  She had abd pain prior to/at the time of ER eval.  CT with gall stones.  No abd pain now.  Still on eliquis.  No black stools.  Stools are brown in the meantime.  She has h/o abd pain with fatty meats ie sausage or bacon. She had cut back on fatty and high carb foods to explain her weight change.    Meds, vitals, and allergies reviewed.   ROS: Per HPI unless specifically indicated in ROS section   GEN: nad, alert and oriented HEENT: mucous membranes moist NECK: supple w/o LA CV: rrr. SEM noted.  PULM: ctab, no inc wob ABD: soft, +bs EXT: no edema SKIN: no acute rash  30 minutes were devoted to patient care in this encounter (this includes time spent reviewing the patient's file/history, interviewing and examining the patient, counseling/reviewing plan with patient).   See notes on follow-up labs.

## 2021-12-21 NOTE — Patient Instructions (Signed)
Go to the lab on the way out.   If you have mychart we'll likely use that to update you.    Take care.  Glad to see you. I'll check on the GI clinic follow up.   Avoid fatty foods in the meantime.   If your BP is above 140, then start taking lisinopril '5mg'$  a day.

## 2021-12-22 LAB — VITAMIN B12: Vitamin B-12: 722 pg/mL (ref 211–911)

## 2021-12-23 ENCOUNTER — Telehealth: Payer: Self-pay

## 2021-12-23 DIAGNOSIS — K8020 Calculus of gallbladder without cholecystitis without obstruction: Secondary | ICD-10-CM | POA: Insufficient documentation

## 2021-12-23 NOTE — Assessment & Plan Note (Signed)
Discussed options. If BP is above 140, then start taking lisinopril '5mg'$  a day.  She agrees.  She can update me as needed.

## 2021-12-23 NOTE — Assessment & Plan Note (Signed)
Anatomy discussed with patient.  She has h/o abd pain with fatty meats ie sausage or bacon. She had cut back on fatty and high carb foods to explain her weight change.  Discussed that symptoms after a fatty meal could be due to gallstones.  Discussed limiting fatty intake.  I am checking with staff about GI clinic follow-up.  At this point still okay for outpatient follow-up.  She agrees to plan.

## 2021-12-23 NOTE — Telephone Encounter (Signed)
     Patient  visit on 10/7  at [place] you been able to follow up with your primary care physician? yes  The patient was or was not able to obtain any needed medicine or equipment. yes  Are there diet recommendations that you are having difficulty following? na  Patient expresses understanding of discharge instructions and education provided has no other needs at this time.  yes    [place], Care Management  [phone] [address], [place], [place] [address] Phone: [phone] Email: [email or web address]

## 2021-12-24 NOTE — Chronic Care Management (AMB) (Deleted)
  Care Coordination Note  12/24/2021 Name: [name] MRN: [id number] DOB: [birth year]  [name] is a 86 y.o. year old female who is a primary care patient of [name], MD and is actively engaged with the care management team. I reached out to [name] by phone today to assist with re-scheduling a follow up visit with the RN Case Manager  Follow up plan: We have been unable to make contact with the patient for follow up.   Julian Hy, [place] Direct Dial: [phone]

## 2021-12-24 NOTE — Chronic Care Management (AMB) (Signed)
  Care Coordination Note  12/24/2021 Name: [name] MRN: [id number] DOB: 1936/02/16  [name] is a 86 y.o. year old female who is a primary care patient of [name], MD and is actively engaged with the care management team. I reached out to [name] by phone today to assist with re-scheduling a follow up visit with the RN Case Manager  Follow up plan: Telephone appointment with care management team member scheduled for: 12/27/2021  Julian Hy, [place] Direct Dial: [phone]

## 2021-12-27 ENCOUNTER — Ambulatory Visit: Payer: Self-pay

## 2021-12-27 NOTE — Patient Outreach (Signed)
  Care Coordination   Follow Up Visit Note   12/27/2021 Name: [name] MRN: [id number] DOB: 05-17-1935  [name] is a 86 y.o. year old female who sees [name], MD for primary care. I spoke with  [name] by phone today.  What matters to the patients health and wellness today?  Patient reports having ED visit on 12/18/21 due to elevated blood pressure and abdominal pain. She denies any medication adjustments from ED visit.  Patient reports having a follow up visit with her primary care provider on 12/21/21.  She states she has gallstones. Patient states she was advised by her doctor to take a lisinopril if her systolic blood pressure is above 140.  Patient reports recent blood pressure readings:  155/69, 143/60, 150/62, 167/54, 141/58 and 166/80.  Patient states she took a lisinopril daily for these blood pressure readings.     Goals Addressed             This Visit's Progress    Patient Stated: " Managing my health"       Care Coordination Interventions: Evaluation of current treatment plan related to high blood pressure / abdominal pain and patient's adherence to plan as established by provider Advised patient to eat a foods such as fresh fruit, vegetables, whole grains, lean meat/fish/poultry, and low fat dairy products.  Advised patient to avoid foods such as fried, deep fried or buttered foods, sausage, salami, bacon, high processed foods, high fat dairy foods.  Reviewed medications with patient and discussed importance of compliance Reviewed scheduled/upcoming provider appointments  Discussed plans with patient for ongoing care management follow up and provided patient with direct contact information for care management team Advised patient to monitor blood pressure daily and record. Advised to notify provider for ongoing elevated blood pressure readings.  Advised patient to contact her GI provider's office if she does not hear from them regarding scheduling  an appointment.           SDOH assessments and interventions completed:  No     Care Coordination Interventions Activated:  Yes  Care Coordination Interventions:  Yes, provided   Follow up plan: Follow up call scheduled for 02/01/22 at 10 am    Encounter Outcome:  Pt. Visit Completed   Quinn Plowman RN,BSN,CCM [place] [phone] direct line

## 2021-12-28 ENCOUNTER — Telehealth: Payer: Self-pay | Admitting: Family Medicine

## 2021-12-28 DIAGNOSIS — K921 Melena: Secondary | ICD-10-CM

## 2021-12-28 NOTE — Telephone Encounter (Signed)
Patient was seen in office on 10/10,she called in stating that she isn't any better,and that she is in a lot of pain. She would like to know should she come in to be seen again,or is there anything Dr Damita Dunnings suggest for her to do?

## 2021-12-28 NOTE — Telephone Encounter (Signed)
Please check with patient.  If she is having recurrent abd pain, then limit fatty foods.  And we likely need to have her see general surgery re: gallstones.    If unrelenting pain or overall worsening sx, then needs ER eval.    If she has a different pain, then let me know.   Let me know if she needs a referral to general surgery.    Thanks.

## 2021-12-29 NOTE — Telephone Encounter (Signed)
Late documentation. I spoke with patient yesterday afternoon. She stated it was same pain as before but much worse. Patient states she can hardly walk it hurts so bad. It started in her back and RUQ and moved to left side and has pain all over her abd. I advised patient to go to the ER to seek eval per Dr. Josefine Class recommendation. Patient stated she will see if she can get someone to take her to call ems.

## 2021-12-29 NOTE — Telephone Encounter (Signed)
Please get update on patient.  Thanks. 

## 2021-12-30 ENCOUNTER — Other Ambulatory Visit: Payer: Self-pay | Admitting: Family Medicine

## 2021-12-31 NOTE — Addendum Note (Signed)
Addended by: [name] on: 12/31/2021 05:01 PM   Modules accepted: Orders

## 2021-12-31 NOTE — Telephone Encounter (Signed)
If black stools, then needs UC vs ER eval over the weekend.  I put in the new GI referral.  Thanks.

## 2021-12-31 NOTE — Telephone Encounter (Signed)
Spoke with patient and she did not go to the hospital as advised or UC. Patient states abd isn't hurting as badly as it was when she called but still hurts. Patient states she has started to have black stools today and yesterday. GI won't see her til Feb. Wants to know if an urgent referral can be placed there or somewhere else to be able to be seen sooner?

## 2021-12-31 NOTE — Telephone Encounter (Signed)
Spoke with patient and gave Dr. Carole Civil recommendation to seek eval at [facility] or ER this weekend. Patient verbalized understanding. She is aware a new referral was done.

## 2022-01-01 ENCOUNTER — Emergency Department: Payer: Medicare HMO

## 2022-01-01 ENCOUNTER — Other Ambulatory Visit: Payer: Self-pay

## 2022-01-01 ENCOUNTER — Emergency Department
Admission: EM | Admit: 2022-01-01 | Discharge: 2022-01-01 | Disposition: A | Discharge status: Home / Self Care | Payer: Medicare HMO | Attending: Emergency Medicine | Admitting: Emergency Medicine

## 2022-01-01 DIAGNOSIS — I700 Atherosclerosis of aorta: Secondary | ICD-10-CM | POA: Diagnosis not present

## 2022-01-01 DIAGNOSIS — K921 Melena: Secondary | ICD-10-CM | POA: Diagnosis not present

## 2022-01-01 DIAGNOSIS — I2510 Atherosclerotic heart disease of native coronary artery without angina pectoris: Secondary | ICD-10-CM | POA: Diagnosis not present

## 2022-01-01 DIAGNOSIS — R1031 Right lower quadrant pain: Secondary | ICD-10-CM | POA: Insufficient documentation

## 2022-01-01 DIAGNOSIS — I10 Essential (primary) hypertension: Secondary | ICD-10-CM | POA: Insufficient documentation

## 2022-01-01 DIAGNOSIS — R109 Unspecified abdominal pain: Secondary | ICD-10-CM | POA: Diagnosis not present

## 2022-01-01 LAB — CBC WITH DIFFERENTIAL/PLATELET
Abs Immature Granulocytes: 0.03 10*3/uL (ref 0.00–0.07)
Basophils Absolute: 0 10*3/uL (ref 0.0–0.1)
Basophils Relative: 0 %
Eosinophils Absolute: 0.1 10*3/uL (ref 0.0–0.5)
Eosinophils Relative: 2 %
HCT: 39.6 % (ref 36.0–46.0)
Hemoglobin: 12.5 g/dL (ref 12.0–15.0)
Immature Granulocytes: 0 %
Lymphocytes Relative: 26 %
Lymphs Abs: 1.7 10*3/uL (ref 0.7–4.0)
MCH: 27.5 pg (ref 26.0–34.0)
MCHC: 31.6 g/dL (ref 30.0–36.0)
MCV: 87.2 fL (ref 80.0–100.0)
Monocytes Absolute: 0.5 10*3/uL (ref 0.1–1.0)
Monocytes Relative: 7 %
Neutro Abs: 4.4 10*3/uL (ref 1.7–7.7)
Neutrophils Relative %: 65 %
Platelets: 185 10*3/uL (ref 150–400)
RBC: 4.54 MIL/uL (ref 3.87–5.11)
RDW: 14 % (ref 11.5–15.5)
WBC: 6.7 10*3/uL (ref 4.0–10.5)
nRBC: 0 % (ref 0.0–0.2)

## 2022-01-01 LAB — URINALYSIS, ROUTINE W REFLEX MICROSCOPIC
Bilirubin Urine: NEGATIVE
Glucose, UA: NEGATIVE mg/dL
Hgb urine dipstick: NEGATIVE
Ketones, ur: NEGATIVE mg/dL
Leukocytes,Ua: NEGATIVE
Nitrite: NEGATIVE
Protein, ur: NEGATIVE mg/dL
Specific Gravity, Urine: 1.02 (ref 1.005–1.030)
pH: 6 (ref 5.0–8.0)

## 2022-01-01 LAB — COMPREHENSIVE METABOLIC PANEL
ALT: 20 U/L (ref 0–44)
AST: 26 U/L (ref 15–41)
Albumin: 3.7 g/dL (ref 3.5–5.0)
Alkaline Phosphatase: 91 U/L (ref 38–126)
Anion gap: 7 (ref 5–15)
BUN: 21 mg/dL (ref 8–23)
CO2: 26 mmol/L (ref 22–32)
Calcium: 8.9 mg/dL (ref 8.9–10.3)
Chloride: 104 mmol/L (ref 98–111)
Creatinine, Ser: 0.89 mg/dL (ref 0.44–1.00)
GFR, Estimated: >60 mL/min (ref >60)
Glucose, Bld: 106 mg/dL — ABNORMAL HIGH (ref 70–99)
Potassium: 4.1 mmol/L (ref 3.5–5.1)
Sodium: 137 mmol/L (ref 135–145)
Total Bilirubin: 0.8 mg/dL (ref 0.3–1.2)
Total Protein: 7.5 g/dL (ref 6.5–8.1)

## 2022-01-01 LAB — SAMPLE TO BLOOD BANK

## 2022-01-01 LAB — LIPASE, BLOOD: Lipase: 35 U/L (ref 11–51)

## 2022-01-01 MED ORDER — IOHEXOL 300 MG/ML  SOLN
100.0000 mL | Freq: Once | INTRAMUSCULAR | Status: AC | PRN
  Administered 2022-01-01: 100 mL via INTRAVENOUS

## 2022-01-01 NOTE — ED Triage Notes (Signed)
Pt to ED POV from home for cramping abdominal pain, back pain and blood in stool (only with wiping) (dark red). Pt in NAD. States this is ongoing since 1 year.

## 2022-01-01 NOTE — ED Notes (Addendum)
Patient was incontinent to a large amount of urine in the bed. Patient was assisted from the bed. Patient had a steady gait. Patient was changed into  a gown due to all clothes being soiled.Linens were changed. Patient is alert and oriented, States she was asleep when she voided. Dr. Starleen Blue aware.

## 2022-01-01 NOTE — ED Notes (Signed)
Patient dressed in paper scrubs. Patient's cane found in lobby bathroom. Ride is here for patient.

## 2022-01-01 NOTE — ED Provider Notes (Signed)
[facility] Provider Note    Event Date/Time   First MD Initiated Contact with Patient 01/01/22 1301     (approximate)   History   Abdominal Pain, Back Pain, and Rectal Bleeding   HPI  KILEEN LANGE is a 86 y.o. female past medical history of atrial fibrillation, hypertension, coronary disease who presents because of abdominal pain.  Patient points to the right lower abdomen when asked about the location of the pain.  Pain radiates around to the right back and in the groin region.  She notes that its been present for about 1 week but triage note says it has been present for a year.  She also notes she has had dark black tarry stool.  Has had history of melena in the past.  She is on Eliquis.  Denies fevers or chills or urinary symptoms.  Pain is constant.  Patient was seen in the ED on 10/7 for abdominal pain had a CT abdomen pelvis that did not have any acute findings.  Hemoglobin was stable and had no melena on exam was discharged.  I see telephone encounters with her PCP she has been referred to GI.     Past Medical History:  Diagnosis Date   Allergy 08/26/2002   [facility] hemoptysis actually allergic rhinitis   Back pain    pt states from knee pain   CAD (coronary artery disease)    1 stent   Cholelithiasis 07/1996   Depression    takes Zoloft daily   Exertional dyspnea 11/03/2011   GERD (gastroesophageal reflux disease)    takes Prevacid daily   Group B streptococcal infection 12/25/2011   History of gout    Hyperlipidemia 03/1999   takes Lipitor nightly   Hypertension 06/1999   Infection of total right knee replacement ([place]) 12/23/2011   Joint pain    Joint swelling    Kidney stones    Left leg DVT ([place]) 07/2010   NSVD (normal spontaneous vaginal delivery)    x 5   Obesity (BMI 30.0-34.9) 06/21/2006   Qualifier: Diagnosis of  By: Council Mechanic MD, Hilaria Ota    Osteoarthritis 07/1996   Osteoarthritis of left knee 08/02/2011   Osteoarthritis of  right knee 11/01/2011   Peripheral edema    takes Furosemide daily   Pneumonia    hx of--as a child   Primary osteoarthritis of left knee 09/23/2010   Per Dr. Mardelle Matte with [facility]    Status post right total knee replacement 12/25/2011   Urinary frequency     Patient Active Problem List   Diagnosis Date Noted   Gall stones 12/23/2021   Abnormal MRI 11/15/2021   Urinary incontinence 11/15/2021   Gait abnormality 11/03/2021   Coordination impairment 11/03/2021   DOE (dyspnea on exertion) 08/04/2021   External hemorrhoid 05/10/2021   Sigmoid diverticulitis 02/11/2021   BRBPR (bright red blood per rectum) 02/10/2021   Change in stool 01/31/2021   Moderate aortic stenosis 11/20/2020   Other social stressor 09/27/2020   Cecal polyp    Diverticulosis    GI bleed 07/29/2020   Foot callus 12/30/2019   Gout 08/21/2017   Vision loss 07/30/2017   Headache 07/30/2017   Syncope 12/08/2016   Atrial fibrillation ([place]) 12/05/2016   Advance care planning 09/09/2016   Dizziness 01/20/2016   Fatigue 01/20/2016   Arthritis 04/13/2015   Rhinitis 02/25/2015   Osteoarthritis of both knees 11/13/2014   Primary osteoarthritis of both knees 11/13/2014   Cough  11/13/2013   Flank pain 11/21/2012   GERD (gastroesophageal reflux disease)    Back pain 05/04/2011   Hand pain 10/27/2010   Osteoporosis 08/23/2010   Dysuria 06/07/2010   UNSPECIFIED VITAMIN D DEFICIENCY 09/23/2008   DEPRESSION/ANXIETY 09/23/2008   Obesity (BMI 30.0-34.9) 06/21/2006   CORONARY ARTERY DISEASE, S/P PTCA 06/21/2006   Extrinsic asthma 06/21/2006   Essential hypertension 06/13/1999   Hyperlipidemia 03/15/1999     Physical Exam  Triage Vital Signs: ED Triage Vitals  Enc Vitals Group     BP 01/01/22 1103 (!) 150/70     Pulse Rate 01/01/22 1103 64     Resp 01/01/22 1103 17     Temp 01/01/22 1103 98.5 F (36.9 C)     Temp Source 01/01/22 1103 Oral     SpO2 01/01/22 1103 94 %     Weight 01/01/22 1121 184  lb (83.5 kg)     Height 01/01/22 1121 '5\' 6"'$  (1.676 m)     Head Circumference --      Peak Flow --      Pain Score 01/01/22 1121 4     Pain Loc --      Pain Edu? --      Excl. in [place]? --     Most recent vital signs: Vitals:   01/01/22 1700 01/01/22 1802  BP: 139/60 (!) 157/68  Pulse: (!) 48 (!) 53  Resp:  16  Temp:    SpO2: 93% 97%     General: Awake, no distress.  CV:  Good peripheral perfusion.  Resp:  Normal effort.  Abd:  No distention.  Abdomen is soft there is mild tenderness in bilateral lower quadrants without guarding Neuro:             Awake, Alert, Oriented x 3  Other:  Brown stool on rectal exam   ED Results / Procedures / Treatments  Labs (all labs ordered are listed, but only abnormal results are displayed) Labs Reviewed  URINALYSIS, ROUTINE W REFLEX MICROSCOPIC - Abnormal; Notable for the following components:      Result Value   Color, Urine STRAW (*)    APPearance CLEAR (*)    All other components within normal limits  COMPREHENSIVE METABOLIC PANEL - Abnormal; Notable for the following components:   Glucose, Bld 106 (*)    All other components within normal limits  CBC WITH DIFFERENTIAL/PLATELET  LIPASE, BLOOD  SAMPLE TO BLOOD BANK     EKG     RADIOLOGY CT abdomen pelvis reviewed interpreted myself is negative for acute abnormality   PROCEDURES:  Critical Care performed: No  Procedures  MEDICATIONS ORDERED IN ED: Medications  iohexol (OMNIPAQUE) 300 MG/ML solution 100 mL (100 mLs Intravenous Contrast Given 01/01/22 1635)     IMPRESSION / MDM / ASSESSMENT AND PLAN / ED COURSE  I reviewed the triage vital signs and the nursing notes.                              Patient's presentation is most consistent with acute presentation with potential threat to life or bodily function.  Differential diagnosis includes, but is not limited to, appendicitis, diverticulitis, colonic malignancy, pyelonephritis, kidney stone  The patient is an  86 year old female presenting with abdominal pain and dark stool.  Somewhat unclear how long has been going on initially she tells me is about a week then tells me its been about a year and I see she  was seen in the ED 2 weeks ago for similar.  She points to the right lower quadrant when asked about the location of the pain it seems to radiate around to her back.  Tells me her stools have been dark intermittently black.  She is hemodynamically stable overall she looks well her abdomen is soft but she is tender in bilateral lower quadrants.  She has brown stool on rectal exam clearly no melena.  Labs are reassuring including no leukocytosis she has no anemia hemoglobin is actually increased from prior 2 weeks ago.  CMP and lipase are normal.  She is pending urinalysis.  Given pain seems to have been worsening over the last 2 weeks will obtain a repeat CT abdomen pelvis with contrast.  Patient would benefit from GI evaluation if this is negative as will likely need a colonoscopy.  Am not concerned about a GI bleed at this time given she has brown stool and a stable hemoglobin no elevated BUN.  Patient CT does not have any acute abnormality to explain patient's symptoms.  UA does not suggest infection.  Patient is appropriate for discharge.  She has been referred to GI by her primary doctor.  Encouraged her to keep this appointment.     FINAL CLINICAL IMPRESSION(S) / ED DIAGNOSES   Final diagnoses:  Right lower quadrant abdominal pain     Rx / DC Orders   ED Discharge Orders     None        Note:  This document was prepared using Dragon voice recognition software and may include unintentional dictation errors.   Rada Hay, MD 01/01/22 Lurena Nida

## 2022-01-01 NOTE — ED Provider Triage Note (Signed)
Emergency Medicine Provider Triage Evaluation Note  [name] , a 86 y.o. female  was evaluated in triage.  Pt complains of abdominal pain. Reports that her pain is suprapubic and radiates to both sides of her abdomen. Came on 10/7 and was found to have gallstones. Patient also reports that she has bright red blood in stool. Patient reports ongoing for 1 year. No dysuria.  Review of Systems  Positive: Abd pain, BRBPR Negative: Fever, dysuria  Physical Exam  BP (!) 150/70 (BP Location: Left Arm)   Pulse 64   Temp 98.5 F (36.9 C) (Oral)   Resp 17   SpO2 94%  Gen:   Awake, no distress   Resp:  Normal effort  MSK:   Moves extremities without difficulty  Other:    Medical Decision Making  Medically screening exam initiated at 11:16 AM.  Appropriate orders placed.  [name] was informed that the remainder of the evaluation will be completed by another provider, this initial triage assessment does not replace that evaluation, and the importance of remaining in the ED until their evaluation is complete.     Marquette Old, PA-C 01/01/22 1121

## 2022-01-01 NOTE — Discharge Instructions (Signed)
Your CAT scan did not show any obvious explanation for your pain.  Your blood work is reassuring.  Please follow-up with your primary doctor and with gastroenterology regarding your pain and dark stool.

## 2022-01-01 NOTE — ED Notes (Signed)
Danise Edge, patient niece, [phone]

## 2022-01-03 ENCOUNTER — Telehealth: Payer: Self-pay

## 2022-01-03 NOTE — Progress Notes (Signed)
Chronic Care Management Pharmacy Assistant   Name: [name]  MRN: [id number] DOB: 1936/01/12  Reason for Encounter: CCM (Medication Adherence and Delivery Coordination and Hospital Follow Up)  Recent office visits:    Recent consult visits:  01/01/22 [name], MD (Internal Med) Acute low back pain "Sent patient to the ER again for persistent darkened stools, and now blood in her urine (Eliquis). She is on blood thinners."  Hospital visits:   Reviewed hospital notes for details of recent visit. Has patient been contacted by Transitions of Care team? No Has patient seen PCP/specialist for hospital follow up (summarize OV if yes): No  Admitted to the ED on 01/01/2022. Discharge date was 01/01/2022.  Discharged from [facility].   Discharge diagnosis (Principal Problem): Right lower quadrant abdominal pain  Patient was discharged to Home  Brief summary of hospital course:  The patient is an 86 year old female presenting with abdominal pain and dark stool.  Somewhat unclear how long has been going on initially she tells me is about a week then tells me its been about a year and I see she was seen in the ED 2 weeks ago for similar.  She points to the right lower quadrant when asked about the location of the pain it seems to radiate around to her back.  Tells me her stools have been dark intermittently black.  She is hemodynamically stable overall she looks well her abdomen is soft but she is tender in bilateral lower quadrants.  She has brown stool on rectal exam clearly no melena.  Labs are reassuring including no leukocytosis she has no anemia hemoglobin is actually increased from prior 2 weeks ago.  CMP and lipase are normal.  She is pending urinalysis.  Given pain seems to have been worsening over the last 2 weeks will obtain a repeat CT abdomen pelvis with contrast.  Patient would benefit from GI evaluation if this is negative as will likely need a colonoscopy.   Am not concerned about a GI bleed at this time given she has brown stool and a stable hemoglobin no elevated BUN.   Patient CT does not have any acute abnormality to explain patient's symptoms.  UA does not suggest infection.  Patient is appropriate for discharge.  She has been referred to GI by her primary doctor.  Encouraged her to keep this appointment.   Medications that remain the same after Hospital Discharge:??  -All other medications will remain the same.    Next CCM appt: 02/15/2022  Other upcoming appts: No appointments scheduled within the next 30 days.  [name], PharmD notified and will determine if action is needed.   Reviewed hospital notes for details of recent visit. Has patient been contacted by Transitions of Care team? No Has patient seen PCP/specialist for hospital follow up (summarize OV if yes): Yes 12/21/21 [name], MD  Stop: colchicine 0.6 MG tablet  Stop: doxycycline (VIBRAMYCIN) 100 MG capsule  Stop: furosemide (LASIX) 20 MG tablet Start: Lisinopril 5 mg daily if BP above 140  Admitted to the ED on 12/18/2021. Discharge date was 12/18/2021.  Discharged from [facility].   Discharge diagnosis (Principal Problem): Hypertension Patient was discharged to Home  Brief summary of hospital course: Patient comes in with hypertension and left lower quadrant pain.  There was some concern for some dark stools however today there was no evidence of any melena.  Her CBC was ordered evaluate for anemia which shows hemoglobin of 11.8.  I reviewed her prior colonoscopies and she has had diverticulosis in the past thought to be causing her her bleeding.  She is also has known hemorrhoids.  Her CMP is reassuring.  Type and screen is A positive.   IMPRESSION: 1. Cholelithiasis without evidence of acute cholecystitis.    CT imaging as above.  On repeat abdominal exam patient's abdomen is soft and nontender she denies any current pain or bleeding.   I will suspicion for cholecystitis do not feel that ultrasound is necessary at this time given her pain earlier where it was in the lower abdomen.   Pt requesting dc home- reports wanting to try to eat.    Called pt grandson  [name]- no answer  Attempted to call granddaughter with no answer    I considered admission for patient given she came in with report of dark stools but given no melena on examination patient is requesting to be discharged home.  I attempted to call family member to further discuss with them but there have been no answers.  I reviewed her discharge summary from November 2022 where she was admitted for rectal bleeding thought to be secondary to diverticulum and resolved without any issues.  Her hemoglobin is slightly low but where she was at 4 months ago.  She also has known hemorrhoids that is positive at the positive Hemoccult is related to that.  We discussed continuing her blood thinner for now and returning if she develops return of the black tarry stools or bright red blood per rectum otherwise she can follow-up outpatient with her GI doctor or return to the ER if she develops weakness or any other concerns but at this time she would prefer to be able to be discharged home.  She continues to deny any headaches, dizziness or any other concerns.   On repeat evaluation patient still would like to go home.  We discussed admission but given she reports no symptoms at this time she would like to go home.  She reports that she lives with family members.  She states that she will follow-up outpatient.  If her troponin is negative patient will be discharged   No abdominal pain on re-assessment- denies urinary symptoms or confusions and declined urine test   The patient is on the cardiac monitor to evaluate for evidence of arrhythmia and/or significant heart rate changes.  [name], PharmD notified and will determine if action is needed.    Medications: Outpatient  Encounter Medications as of 01/03/2022  Medication Sig   ALPRAZolam (XANAX) 0.5 MG tablet TAKE ONE TABLET BY MOUTH twice daily AS NEEDED FOR ANXIETY   atorvastatin (LIPITOR) 80 MG tablet TAKE ONE TABLET BY MOUTH EVERY EVENING   Cholecalciferol (VITAMIN D) 2000 units CAPS Take 2,000 Units by mouth daily.   diltiazem (CARDIZEM CD) 180 MG 24 hr capsule TAKE ONE CAPSULE BY MOUTH ONCE DAILY   ELIQUIS 5 MG TABS tablet TAKE ONE TABLET BY MOUTH TWICE DAILY   fluticasone (FLONASE) 50 MCG/ACT nasal spray USE TWO SPRAYS in each nostril ONCE DAILY   fluticasone (FLOVENT HFA) 110 MCG/ACT inhaler Inhale 2 puffs into the lungs in the morning and at bedtime. Rinse after use   hydrocortisone (ANUSOL-HC) 25 MG suppository Place 1 suppository (25 mg total) rectally 2 (two) times daily as needed for hemorrhoids or anal itching.   lansoprazole (PREVACID) 30 MG capsule TAKE ONE CAPSULE BY MOUTH TWICE DAILY   lisinopril (ZESTRIL) 5 MG tablet Stopped as of 11/11/21 (Patient not  taking: Reported on 12/21/2021)   neomycin-polymyxin b-dexamethasone (MAXITROL) 3.5-10000-0.1 OINT Place 1 Application into the right eye 2 (two) times daily.   sertraline (ZOLOFT) 100 MG tablet TAKE 1 AND 1/2 TABLETS BY MOUTH ONCE DAILY   triamcinolone cream (KENALOG) 0.1 % apply one application TOPICALLY twice daily   vitamin B-12 (CYANOCOBALAMIN) 1000 MCG tablet Take 1,000 mcg by mouth daily.   No facility-administered encounter medications on file as of 01/03/2022.   BP Readings from Last 3 Encounters:  01/01/22 (!) 157/68  12/21/21 136/70  12/18/21 (!) 178/74    Lab Results  Component Value Date   HGBA1C 5.9 10/17/2011    Recent OV, Consult or Hospital visit:  Recent medication changes indicated:   Stop: colchicine 0.6 MG tablet   Stop: doxycycline (VIBRAMYCIN) 100 MG capsule   Stop: furosemide (LASIX) 20 MG tablet  Start: Lisinopril 5 mg daily if BP above 140  Last adherence delivery date: 12/15/2021      Patient is due for  next adherence delivery on: 01/13/2022  Spoke with patient on 01/04/2022 reviewed medications and coordinated delivery.  This delivery to include: Adherence Packaging  30 Days  Packs: Sertraline '100mg'$   take 1 1/2 tablets daily breakfast, evening meal  Lansoprazole '30mg'$  Take 1 tablet at breakfast and 1 tablet evening meal Diltiazem '180mg'$  24 hr capsule 1 capsule by mouth daily breakfast  Atorvastatin '80mg'$  Tablet 1 tablet by mouth daily breakfast Eliquis '5mg'$  take 1 tablet 2 times daily breakfast and evening meal   Vial Medications:  Fluticasone propionate 110/mcg 2 puffs in the morning and bedtime - still using that one  Patient declined the following medications this month: Colchicine 0.6 mg 1 tablet daily as needed - Patient stopped   Any concerns about your medications? No  How often do you forget or accidentally miss a dose? Rarely  Do you use a pillbox? No  Is patient in packaging Yes  If yes  What is the date on your next pill pack? 01/04/2022  Any concerns or issues with your packaging? No  No refill request needed.  Confirmed delivery date of 01/13/2022, advised patient that pharmacy will contact them the morning of delivery.   Recent blood pressure readings are as follows: Patient stated if her BP is above 140 she takes 1 tablet of Lisinopril '5mg'$ .  Date  Blood Pressure Pressure 10/24  124/61   60 10/23  144/62   54 10/22  148/60   58 10/21  134/62   58 10/20  132/74   57 10/19  137/58   53 10/18  154/87   54 10/17  139/58   61 10/16  166/80   65 10/15  141/58   61  Annual wellness visit in last year? No Most Recent BP reading: 157/68 on 01/01/2022   [name], CPP notified   [name], [place] Clinical Pharmacy Assistant [phone]   Cycle dispensing form sent to [facility], CPP for review.

## 2022-01-05 DIAGNOSIS — I480 Paroxysmal atrial fibrillation: Secondary | ICD-10-CM | POA: Diagnosis not present

## 2022-01-05 DIAGNOSIS — I350 Nonrheumatic aortic (valve) stenosis: Secondary | ICD-10-CM | POA: Diagnosis not present

## 2022-01-05 DIAGNOSIS — I10 Essential (primary) hypertension: Secondary | ICD-10-CM | POA: Diagnosis not present

## 2022-01-06 DIAGNOSIS — R6889 Other general symptoms and signs: Secondary | ICD-10-CM | POA: Diagnosis not present

## 2022-01-06 DIAGNOSIS — H353211 Exudative age-related macular degeneration, right eye, with active choroidal neovascularization: Secondary | ICD-10-CM | POA: Diagnosis not present

## 2022-01-06 DIAGNOSIS — H43813 Vitreous degeneration, bilateral: Secondary | ICD-10-CM | POA: Diagnosis not present

## 2022-01-06 DIAGNOSIS — H353122 Nonexudative age-related macular degeneration, left eye, intermediate dry stage: Secondary | ICD-10-CM | POA: Diagnosis not present

## 2022-01-06 DIAGNOSIS — H3540 Unspecified peripheral retinal degeneration: Secondary | ICD-10-CM | POA: Diagnosis not present

## 2022-01-10 DIAGNOSIS — R1084 Generalized abdominal pain: Secondary | ICD-10-CM | POA: Diagnosis not present

## 2022-01-10 DIAGNOSIS — K625 Hemorrhage of anus and rectum: Secondary | ICD-10-CM | POA: Diagnosis not present

## 2022-01-10 DIAGNOSIS — K5909 Other constipation: Secondary | ICD-10-CM | POA: Diagnosis not present

## 2022-01-10 DIAGNOSIS — K5790 Diverticulosis of intestine, part unspecified, without perforation or abscess without bleeding: Secondary | ICD-10-CM | POA: Diagnosis not present

## 2022-01-10 DIAGNOSIS — K649 Unspecified hemorrhoids: Secondary | ICD-10-CM | POA: Diagnosis not present

## 2022-01-12 DIAGNOSIS — I350 Nonrheumatic aortic (valve) stenosis: Secondary | ICD-10-CM | POA: Diagnosis not present

## 2022-01-25 ENCOUNTER — Telehealth: Payer: Self-pay | Admitting: Family Medicine

## 2022-01-25 DIAGNOSIS — J4520 Mild intermittent asthma, uncomplicated: Secondary | ICD-10-CM

## 2022-01-25 DIAGNOSIS — R6889 Other general symptoms and signs: Secondary | ICD-10-CM | POA: Diagnosis not present

## 2022-01-25 DIAGNOSIS — I350 Nonrheumatic aortic (valve) stenosis: Secondary | ICD-10-CM | POA: Diagnosis not present

## 2022-01-25 NOTE — Telephone Encounter (Signed)
Patient called and stated some of medication didn't get put in her package.

## 2022-01-26 MED ORDER — FLUTICASONE PROPIONATE HFA 110 MCG/ACT IN AERO: 2.0000 | INHALATION_SPRAY | Freq: Two times a day (BID) | RESPIRATORY_TRACT | 3 refills | Status: DC

## 2022-01-26 MED ORDER — ALPRAZOLAM 0.5 MG PO TABS: 0.5000 mg | ORAL_TABLET | Freq: Two times a day (BID) | ORAL | 1 refills | Status: DC | PRN

## 2022-01-26 MED ORDER — FLUTICASONE PROPIONATE 50 MCG/ACT NA SUSP: NASAL | 11 refills | Status: DC

## 2022-01-26 NOTE — Telephone Encounter (Signed)
Patient states flonase and flovent were left out of her delivery. Looks like patient was out of refills on these meds. [facility]. Patient also stated that she needs refill on alprazolam for next delivery in December.   LOV - 12/21/21 NOV - not scheduled RF - 10/08/21 #60/1

## 2022-01-26 NOTE — Addendum Note (Signed)
Addended by: [name] on: 01/26/2022 06:02 PM   Modules accepted: Orders

## 2022-01-26 NOTE — Telephone Encounter (Signed)
Sent. Thanks.   

## 2022-01-31 ENCOUNTER — Telehealth: Payer: Self-pay

## 2022-01-31 NOTE — Progress Notes (Cosign Needed Addendum)
Chronic Care Management Pharmacy Assistant   Name: [name]  MRN: [id number] DOB: July 06, 1935  Reason for Encounter: CCM (Medication Adherence and Delivery Coordination)  Recent office visits:  None since last CCM contact  Recent consult visits:  01/25/22 Serafina Royals, MD (Cardiology) Echocardiogram 01/04/22 Serafina Royals, MD (Cardiology) Moderate aortic stenosis No med changes  Hospital visits:  None since last CCM contact  Medications: Outpatient Encounter Medications as of 01/31/2022  Medication Sig   ALPRAZolam (XANAX) 0.5 MG tablet Take 1 tablet (0.5 mg total) by mouth 2 (two) times daily as needed. for anxiety   atorvastatin (LIPITOR) 80 MG tablet TAKE ONE TABLET BY MOUTH EVERY EVENING   Cholecalciferol (VITAMIN D) 2000 units CAPS Take 2,000 Units by mouth daily.   diltiazem (CARDIZEM CD) 180 MG 24 hr capsule TAKE ONE CAPSULE BY MOUTH ONCE DAILY   ELIQUIS 5 MG TABS tablet TAKE ONE TABLET BY MOUTH TWICE DAILY   fluticasone (FLONASE) 50 MCG/ACT nasal spray USE TWO SPRAYS in each nostril ONCE DAILY   fluticasone (FLOVENT HFA) 110 MCG/ACT inhaler Inhale 2 puffs into the lungs in the morning and at bedtime. Rinse after use   hydrocortisone (ANUSOL-HC) 25 MG suppository Place 1 suppository (25 mg total) rectally 2 (two) times daily as needed for hemorrhoids or anal itching.   lansoprazole (PREVACID) 30 MG capsule TAKE ONE CAPSULE BY MOUTH TWICE DAILY   lisinopril (ZESTRIL) 5 MG tablet Stopped as of 11/11/21 (Patient not taking: Reported on 12/21/2021)   neomycin-polymyxin b-dexamethasone (MAXITROL) 3.5-10000-0.1 OINT Place 1 Application into the right eye 2 (two) times daily.   sertraline (ZOLOFT) 100 MG tablet TAKE 1 AND 1/2 TABLETS BY MOUTH ONCE DAILY   triamcinolone cream (KENALOG) 0.1 % apply one application TOPICALLY twice daily   vitamin B-12 (CYANOCOBALAMIN) 1000 MCG tablet Take 1,000 mcg by mouth daily.   No facility-administered encounter medications on file  as of 01/31/2022.   BP Readings from Last 3 Encounters:  01/01/22 (!) 157/68  12/21/21 136/70  12/18/21 (!) 178/74    Lab Results  Component Value Date   HGBA1C 5.9 10/17/2011    Recent OV, Consult or Hospital visit:  No medication changes indicated  Last adherence delivery date: 01/13/2022  Patient is due for next adherence delivery on: 02/14/2022  Spoke with patient on 01/31/2022 reviewed medications and coordinated delivery.  This delivery to include: Adherence Packaging  30 Days  Packs: Sertraline '100mg'$   take 1 1/2 tablets daily breakfast, evening meal  Lansoprazole '30mg'$  Take 1 tablet at breakfast and 1 tablet evening meal Diltiazem '180mg'$  24 hr capsule 1 capsule by mouth daily breakfast  Atorvastatin '80mg'$  Tablet 1 tablet by mouth daily breakfast Eliquis '5mg'$  take 1 tablet 2 times daily breakfast and evening meal   Vial: Fluticasone propionate 110/mcg 2 puffs in the morning and bedtime    Patient declined the following medications this month: None declined  Any concerns about your medications? No   How often do you forget or accidentally miss a dose? Rarely   Do you use a pillbox? No  Is patient in packaging Yes  If yes  What is the date on your next pill pack? 01/31/2022  Any concerns or issues with your packaging? No  No refill request needed.  Confirmed delivery date of 02/14/2022, advised patient that pharmacy will contact them the morning of delivery.   Recent blood pressure readings are as follows: Patient did not have any readings  In speaking with the patient she stated  the police called her. They wanted to know how there was a notarized letter dated two months after her husband passed declaring 47 of his cars (he worked on cars) were to be given to her two daughters. Patient stated she did not know anything about it. They told her they would be investing and prosecuting as both her daughters have sold the cars (under lost titles). Both her daughters could  go to jail. Social Services is now involved. They came to her house and got all her records/papers for everything financial including bank statements.   Annual wellness visit in last year? No Most Recent BP reading: 157/68 on 01/01/2022   [name], CPP notified   [name], [place] Clinical Pharmacy Assistant [phone]  Cycle dispensing form sent to [facility], CPP for review.

## 2022-02-01 ENCOUNTER — Ambulatory Visit: Payer: Self-pay

## 2022-02-01 NOTE — Patient Outreach (Signed)
  Care Coordination   Follow Up Visit Note   02/01/2022 Name: [name] MRN: [id number] DOB: 1935-12-01  [name] is a 86 y.o. year old female who sees [name], MD for primary care. I spoke with  Sharlene Payne by phone today.  What matters to the patients health and wellness today?  Patient reports having visit with gastroenterologist.  She states she was advised to eat more high fiber foods.  She states she is eating more broccoli, more [place] vegetables and no red meat.  Patient states she feels much better. She denies having any abdominal pain.  Patient states her blood pressures are ranging from 100-140/50-60's.      Goals Addressed             This Visit's Progress    Patient Stated: " Managing my health"       Care Coordination Interventions: Evaluation of current treatment plan related to high blood pressure / abdominal pain and patient's adherence to plan as established by provider Advised patient to eat high fiber foods and fresh fruit, vegetables, whole grains, lean meat/fish/poultry, and low fat dairy products.  Advised patient to avoid foods such as fried, deep fried or buttered foods, sausage, salami, bacon, high processed foods, high fat dairy foods.  Reviewed medications with patient and discussed importance of compliance Reviewed scheduled/upcoming provider appointments  Advised patient to continue to monitor blood pressure daily and record. Advised to notify provider for ongoing elevated blood pressure readings.  Advised to drink plenty of water to remain hydrated.           SDOH assessments and interventions completed:  No     Care Coordination Interventions Activated:  Yes  Care Coordination Interventions:  Yes, provided   Follow up plan: Follow up call scheduled for 03/24/22    Encounter Outcome:  Pt. Visit Completed   Quinn Plowman RN,BSN,CCM [place] [phone] direct line

## 2022-02-09 ENCOUNTER — Telehealth: Payer: Self-pay

## 2022-02-09 NOTE — Progress Notes (Unsigned)
    Chronic Care Management Pharmacy Assistant   Name: [name]  MRN: [id number] DOB: June 10, 1935  Reason for Encounter: CCM (Appointment Reminder)  Medications: Outpatient Encounter Medications as of 02/09/2022  Medication Sig   ALPRAZolam (XANAX) 0.5 MG tablet Take 1 tablet (0.5 mg total) by mouth 2 (two) times daily as needed. for anxiety   atorvastatin (LIPITOR) 80 MG tablet TAKE ONE TABLET BY MOUTH EVERY EVENING   Cholecalciferol (VITAMIN D) 2000 units CAPS Take 2,000 Units by mouth daily.   diltiazem (CARDIZEM CD) 180 MG 24 hr capsule TAKE ONE CAPSULE BY MOUTH ONCE DAILY   ELIQUIS 5 MG TABS tablet TAKE ONE TABLET BY MOUTH TWICE DAILY   fluticasone (FLONASE) 50 MCG/ACT nasal spray USE TWO SPRAYS in each nostril ONCE DAILY   fluticasone (FLOVENT HFA) 110 MCG/ACT inhaler Inhale 2 puffs into the lungs in the morning and at bedtime. Rinse after use   hydrocortisone (ANUSOL-HC) 25 MG suppository Place 1 suppository (25 mg total) rectally 2 (two) times daily as needed for hemorrhoids or anal itching.   lansoprazole (PREVACID) 30 MG capsule TAKE ONE CAPSULE BY MOUTH TWICE DAILY   lisinopril (ZESTRIL) 5 MG tablet Stopped as of 11/11/21 (Patient not taking: Reported on 12/21/2021)   neomycin-polymyxin b-dexamethasone (MAXITROL) 3.5-10000-0.1 OINT Place 1 Application into the right eye 2 (two) times daily. (Patient not taking: Reported on 02/01/2022)   sertraline (ZOLOFT) 100 MG tablet TAKE 1 AND 1/2 TABLETS BY MOUTH ONCE DAILY   triamcinolone cream (KENALOG) 0.1 % apply one application TOPICALLY twice daily   vitamin B-12 (CYANOCOBALAMIN) 1000 MCG tablet Take 1,000 mcg by mouth daily.   No facility-administered encounter medications on file as of 02/09/2022.   [name] was contacted to remind of upcoming telephone visit with [name] on 02/15/2022 at 11:00. Patient was reminded to have any blood glucose and blood pressure readings available for review at appointment.    {Appointment confirmation:27274}  Are you having any problems with your medications? {yes/no:20286}   Do you have any concerns you like to discuss with the pharmacist? {yes/no:20286}  CCM referral has been placed prior to visit?  Yes   Star Rating Drugs: Medication:  Last Fill: Day Supply Atorvastatin 80 mg 02/09/22 30  Lisinopril 5 mg  10/06/21 30 - Per [facility] with [facility]  [name], CPP notified  [name], [place] Clinical Pharmacy Assistant [phone]

## 2022-02-10 DIAGNOSIS — R6889 Other general symptoms and signs: Secondary | ICD-10-CM | POA: Diagnosis not present

## 2022-02-10 DIAGNOSIS — H353211 Exudative age-related macular degeneration, right eye, with active choroidal neovascularization: Secondary | ICD-10-CM | POA: Diagnosis not present

## 2022-02-15 ENCOUNTER — Ambulatory Visit: Payer: Medicare HMO | Admitting: Pharmacist

## 2022-02-15 VITALS — BP 125/70

## 2022-02-15 DIAGNOSIS — I10 Essential (primary) hypertension: Secondary | ICD-10-CM

## 2022-02-15 DIAGNOSIS — J4520 Mild intermittent asthma, uncomplicated: Secondary | ICD-10-CM

## 2022-02-15 DIAGNOSIS — I259 Chronic ischemic heart disease, unspecified: Secondary | ICD-10-CM

## 2022-02-15 DIAGNOSIS — I4891 Unspecified atrial fibrillation: Secondary | ICD-10-CM

## 2022-02-15 DIAGNOSIS — E785 Hyperlipidemia, unspecified: Secondary | ICD-10-CM

## 2022-02-15 NOTE — Patient Instructions (Signed)
Visit Information  Phone number for Pharmacist: [phone]   Goals Addressed   None     Care Plan : [place]  Updates made by Charlton Haws, RPH since 02/15/2022 12:00 AM     Problem: Hypertension, Hyperlipidemia, Atrial Fibrillation, Coronary Artery Disease, GERD, Anxiety, and Gout   Priority: High     Long-Range Goal: Disease Management   Start Date: 09/07/2020  Expected End Date: 11/25/2022  This Visit's Progress: On track  Recent Progress: On track  Priority: High  Note:   Current Barriers:  None identified  Pharmacist Clinical Goal(s):  Patient will contact provider office for questions/concerns as evidenced notation of same in electronic health record through collaboration with PharmD and provider.   Interventions: 1:1 collaboration with [name], MD regarding development and update of comprehensive plan of care as evidenced by provider attestation and co-signature Inter-disciplinary care team collaboration (see longitudinal plan of care) Comprehensive medication review performed; medication list updated in electronic medical record  Hypertension (BP goal <140/90) -Controlled - per pt report; she is not using lisinopril very often, maybe 1-2 times per week -Current home readings: 113/62, 125/70 -Denies hypotensive/hypertensive symptoms -Current treatment: Lisinopril 5 mg daily PRN SBP > 140 - Appropriate, Effective, Safe, Accessible Diltiazem CD 180 mg daily - Appropriate, Effective, Safe, Accessible Furosemide 20 mg daily PRN - Appropriate, Effective, Safe, Accessible -Medications previously tried: n/a  -Educated on BP goals and benefits of medications for prevention of heart attack, stroke and kidney damage; Proper BP monitoring technique; Symptoms of hypotension and importance of maintaining adequate hydration; -Counseled to monitor BP at home daily -Recommended to continue current medication  Hyperlipidemia / CAD (LDL goal <  70) -Not ideally controlled - LDL 112 (10/2020), previously 80. Based on refill dates, pt was probably not compliant with atorvastatin for the month preceding labwork; she is now compliant with atorvastatin in daily pill packs -Hx CAD -Current treatment: Atorvastatin 80 mg daily - Appropriate, Query Effective -Medications previously tried: n/a  -Educated on Cholesterol goals; Benefits of statin for ASCVD risk reduction; -Recommended to continue current medication  Atrial Fibrillation (Goal: prevent stroke and major bleeding) -Controlled -CHADSVASC: 7; hx GI bleed/BRBPR -Current treatment: Diltiazem CD 180 mg daily - Appropriate, Effective, Safe, Accessible Eliquis 5 mg BID -Appropriate, Effective, Safe, Accessible -Medications previously tried: n/a -Counseled on importance of adherence to anticoagulant exactly as prescribed; bleeding risk associated with Eliquis and importance of self-monitoring for signs/symptoms of bleeding; avoidance of NSAIDs due to increased bleeding risk with anticoagulants; -Recommended to continue current medication  Asthma (Goal: control symptoms and prevent exacerbations) -Controlled - per pt report; she does not have albuterol because she had a reaction to it previously (sore throat, dizziness) -Pulmonary function testing: none in chart -Exacerbations requiring treatment in last 6 months: 0 -Current treatment  Flovent 110 mcg/act 2 puffs BID - Appropriate, Effective, Safe, Accessible Flonase nasal spray PRN -Appropriate, Effective, Safe, Accessible -Medications previously tried: albuterol  -Patient reports consistent use of maintenance inhaler -Frequency of rescue inhaler use: n/a -Counseled on Proper inhaler technique; Benefits of consistent maintenance inhaler use -Recommended to continue current medication  Depression/Anxiety (Goal: manage symptoms) -Controlled -PHQ9: 0 (11/11/21) -GAD7: not on file -Connected with PCP for mental health  support -Current treatment: Alprazolam 0.5 mg BID PRN - Appropriate, Effective, Safe, Accessible Sertraline 100 mg - 1.5 tab daily -Appropriate, Effective, Safe, Accessible -Medications previously tried/failed: n/a -Educated on Benefits of medication for symptom control -Recommended to continue current medication  Gout (  Goal: Prevent gout flares) -Controlled -Last Gout Flare: 10/2021; no recent uric acid on file -Current treatment  Colchicine 0.6 mg PRN -Appropriate, Effective, Safe, Accessible -Medications previously tried: n/a  -We discussed:  Counseled patient on low purine diet plan. Counseled patient to reduce consumption of high-fructose corn syrup, sweetened soft drinks, fruit juices, meat, and seafood. -Recommended to continue current medication  GERD (Goal: minimize symptoms of reflux ) -Controlled -Current treatment  Lansoprazole 30 mg BID - Appropriate, Effective, Safe, Accessible -Medications previously tried: none reported  -Hx of Bleeds/ulcers: Yes -Recommended to continue current medication  Patient Goals/Self-Care Activities Patient will:  - take medications as prescribed as evidenced by patient report and record review focus on medication adherence by pill packs check blood pressure daily, document, and provide at future appointments      Patient verbalizes understanding of instructions and care plan provided today and agrees to view in [place]. Active MyChart status and patient understanding of how to access instructions and care plan via MyChart confirmed with patient.    Telephone follow up appointment with pharmacy team member scheduled for: 3 months  [name], PharmD, [facility] Clinical Pharmacist [facility] [phone]

## 2022-02-15 NOTE — Progress Notes (Signed)
Chronic Care Management Pharmacy Note  02/15/2022 Name:  [name] MRN:  [id number] DOB:  09-Sep-1935  Summary: CCM F/U visit -HLD/CAD: LDL was 112 (10/2020), above goal of < 70 given CAD; however per refill dates, pt was likely not compliant with atorvastatin for at least a month prior to that [facility]; she is now compliant and due for repeat lipid panel -HTN: BP at home 113/62, 125/70; pt is using lisinopril PRN for SBP > 140 and denies issues with this -GI: pt reports vast improvement in abdominal pain issues since changing to high fiber diet  Recommendations/Changes made from today's visit: -No med changes; advised pt to contact PCP with any very high BP (>180/100)  Plan: -[name] will call patient monthly for medication adherence  -Pharmacist follow up televisit scheduled for 3 months -Annual visit due 10/2022    Subjective: [name] is an 86 y.o. year old female who is a primary patient of Damita Dunnings, Elveria Rising, MD.  The CCM team was consulted for assistance with disease management and care coordination needs.    Engaged with patient face to face for follow up visit in response to provider referral for pharmacy case management and/or care coordination services.   Consent to Services:  The patient was given information about Chronic Care Management services, agreed to services, and gave verbal consent prior to initiation of services.  Please see initial visit note for detailed documentation.   Patient Care Team: [name], MD as PCP - General (Family Medicine) Ubaldo Glassing Javier Docker, MD as Consulting Physician (Cardiology) Birder Robson, MD as Referring Physician (Ophthalmology) Charlton Haws, [facility] as Pharmacist (Pharmacist) Dannielle Karvonen, RN as [place] Management  Recent office visits: 12/21/21 Dr Damita Dunnings OV: ER f/u - rec f/u with GI. Take lisinopril if BP is > 140  11/11/21 Dr Damita Dunnings OV: f/u - discussed holding statin with  colchicine. Rec [place] with PT. Hold lisinopril for lightheadedness.  11/03/21 Dr Einar Pheasant OV: acute gout - start colchicine.  08/02/21 Dr Damita Dunnings OV: acute visit - SOB, dysuria, diarrhea. BNP mildly elevated. Try furosemide 20 mg daily.  Recent consult visits: 01/10/22 PA Croley (GI): initial consult, ER f/u for RLQ pain. Symptoms likely 2/2 low fiber diet, hemorrhoids and MSK pain. Start high fiber diet, start fiber supplement, try heating pad, try peppermint oil. Colonoscopy not indicated.  01/05/22 Dr Nehemiah Massed (Cardiology): Afib, HTN. No changes. Ordered ECHO.  01/01/22 [facility]: sent pt to ED for persistent dark stool and blood in urine.  10/21/21 Dr Marius Ditch (GI): new patient - hemorrhoids improved with anusol. Ligation not recommended due to Eliquis. Advised Miralax daily and kegel exercises.  03/31/21 Dr Corky Sox (Cardiology): f/u - discontinue metoprolol and imdur (not taking). RTC 6 months.  Hospital visits: 01/01/22 ED visit [facility]): RLQ pain. Similar to 2 weeks prior. CT w/o acute abnormality. Encouraged f/u with GI.  12/18/21 ED visit [facility]): Hypertension. BP 164/82. Report of dark stools, no melena on exam. Rec GI f/u.     Objective:  Lab Results  Component Value Date   CREATININE 0.89 01/01/2022   BUN 21 01/01/2022   GFR 58.99 (L) 11/03/2021   GFRNONAA >60 01/01/2022   GFRAA >60 11/21/2019   NA 137 01/01/2022   K 4.1 01/01/2022   CALCIUM 8.9 01/01/2022   CO2 26 01/01/2022   GLUCOSE 106 (H) 01/01/2022    Lab Results  Component Value Date/Time   HGBA1C 5.9 10/17/2011 11:51 AM   GFR 58.99 (  L) 11/03/2021 10:53 AM   GFR 66.08 08/02/2021 11:20 AM   MICROALBUR 0.3 06/22/2009 08:35 AM   MICROALBUR 1.2 09/18/2008 09:48 AM    Last diabetic Eye exam: No results found for: "HMDIABEYEEXA"  Last diabetic Foot exam: No results found for: "HMDIABFOOTEX"   Lab Results  Component Value Date   CHOL 180 11/09/2020   HDL 52.30 11/09/2020   LDLCALC 112 (H) 11/09/2020   LDLDIRECT  94.7 01/30/2014   TRIG 78.0 11/09/2020   CHOLHDL 3 11/09/2020       Latest Ref Rng & Units 01/01/2022   11:04 AM 12/18/2021    9:52 AM 11/03/2021   10:53 AM  Hepatic Function  Total Protein 6.5 - 8.1 g/dL 7.5  7.2  7.7   Albumin 3.5 - 5.0 g/dL 3.7  3.6  4.3   AST 15 - 41 U/L 26  37  23   ALT 0 - 44 U/L _0 Alk Phosphatase 38 - 126 U/L 91  96  105   Total Bilirubin 0.3 - 1.2 mg/dL 0.8  0.6  0.5     Lab Results  Component Value Date/Time   TSH 0.84 08/02/2021 11:20 AM   TSH 1.62 11/09/2020 01:00 PM       Latest Ref Rng & Units 01/01/2022   11:04 AM 12/21/2021    9:29 AM 12/18/2021    9:52 AM  CBC  WBC 4.0 - 10.5 K/uL 6.7  5.0  5.5   Hemoglobin 12.0 - 15.0 g/dL 12.5  12.6  11.8   Hematocrit 36.0 - 46.0 % 39.6  38.5  38.1   Platelets 150 - 400 K/uL 185  180.0  173     Lab Results  Component Value Date/Time   VD25OH 45.28 09/06/2018 11:10 AM   VD25OH 42.92 09/01/2016 12:47 PM    Clinical ASCVD: Yes  The ASCVD Risk score (Arnett DK, et al., 2019) failed to calculate for the following reasons:   The 2019 ASCVD risk score is only valid for ages 46 to 40       11/11/2021    8:36 AM 11/10/2021   11:16 AM 09/24/2020    8:10 AM  Depression screen PHQ 2/9  Decreased Interest 0 0 0  Down, Depressed, Hopeless 0 0 0  PHQ - 2 Score 0 0 0  Altered sleeping 0  0  Tired, decreased energy 0  0  Change in appetite 0  0  Feeling bad or failure about yourself  0  0  Trouble concentrating 0  0  Moving slowly or fidgety/restless 0  0  Suicidal thoughts 0  0  PHQ-9 Score 0  0  Difficult doing work/chores Not difficult at all       CHA2DS2/VAS Stroke Risk Points  Current as of 2 hours ago     7 >= 2 Points: High Risk  1 - 1.99 Points: Medium Risk  0 Points: Low Risk    Last Change: N/A       Points Metrics  0 Has Congestive Heart Failure:  No    Current as of 2 hours ago  1 Has Vascular Disease:  Yes    Current as of 2 hours ago  1 Has Hypertension:  Yes     Current as of 2 hours ago  2 Age:  89    Current as of 2 hours ago  0 Has Diabetes:  No    Current as of 2 hours ago  2 Had Stroke:  No  Had TIA:  No  Had Thromboembolism:  Yes     Current as of 2 hours ago  1 Female:  Yes    Current as of 2 hours ago      Social History   Tobacco Use  Smoking Status Never  Smokeless Tobacco Never   BP Readings from Last 3 Encounters:  01/01/22 (!) 157/68  12/21/21 136/70  12/18/21 (!) 178/74   Pulse Readings from Last 3 Encounters:  01/01/22 (!) 53  12/21/21 76  12/18/21 (!) 54   Wt Readings from Last 3 Encounters:  01/01/22 184 lb (83.5 kg)  12/21/21 186 lb (84.4 kg)  11/11/21 198 lb (89.8 kg)   BMI Readings from Last 3 Encounters:  01/01/22 29.70 kg/m  12/21/21 30.02 kg/m  11/11/21 31.96 kg/m    Assessment/Interventions: Review of patient past medical history, allergies, medications, health status, including review of consultants reports, laboratory and other test data, was performed as part of comprehensive evaluation and provision of chronic care management services.   SDOH:  (Social Determinants of Health) assessments and interventions performed: No - done 8/23 SDOH Interventions    Flowsheet Row Telephone from 11/26/2021 in [place] from 11/10/2021 in [place] at [place] Management from 01/22/2021 in [place] at [place] Management from 09/07/2020 in [place] at [place] Management from 05/28/2020 in [place] at [place] from 09/08/2016 in [place] at [place] Interventions Intervention Not Indicated Intervention Not Indicated -- -- -- --  Housing Interventions -- Intervention Not Indicated -- -- -- --  Transportation Interventions Intervention Not Indicated Intervention Not Indicated -- -- -- --   Depression Interventions/Treatment  -- -- -- -- -- --  [pt is being referred to PCP for further evaluation]  Financial Strain Interventions -- Intervention Not Indicated Other (Comment) Intervention Not Indicated Intervention Not Indicated  [Meds affordable] --  Physical Activity Interventions -- Intervention Not Indicated -- -- -- --  Stress Interventions -- Intervention Not Indicated Provide Counseling -- -- --  Social Connections Interventions -- Intervention Not Indicated -- -- -- --      SDOH Screenings   Food Insecurity: No Food Insecurity (11/26/2021)  Housing: Low Risk  (11/10/2021)  Transportation Needs: No Transportation Needs (11/26/2021)  Alcohol Screen: Low Risk  (11/10/2021)  Depression (PHQ2-9): Low Risk  (11/11/2021)  Financial Resource Strain: Low Risk  (11/10/2021)  Physical Activity: Inactive (11/10/2021)  Social Connections: Moderately Isolated (11/10/2021)  Stress: No Stress Concern Present (11/10/2021)  Tobacco Use: Low Risk  (01/01/2022)    CCM Care Plan  Allergies  Allergen Reactions   Amoxicillin Swelling    Face hands   Penicillins Swelling and Other (See Comments)    Has patient had a PCN reaction causing immediate rash, facial/tongue/throat swelling, SOB or lightheadedness with hypotension: Yes Has patient had a PCN reaction causing severe rash involving mucus membranes or skin necrosis: No Has patient had a PCN reaction that required hospitalization No phalosporin use.   Valium [Diazepam] Other (See Comments)    Tremor.  Tolerates alprazolam.   Imdur [Isosorbide Nitrate]     Headache at 53m dose    Medications Reviewed Today     Reviewed by FCharlton Haws RHima [place](Pharmacist) on 02/15/22 at 1117  Med List Status: <None>   Medication Order Taking? Sig  Documenting Provider Last Dose Status Informant  ALPRAZolam (XANAX) 0.5 MG tablet [id number] Yes Take 1 tablet (0.5 mg total) by mouth 2 (two) times daily as needed. for anxiety [name], MD  Taking Active   atorvastatin (LIPITOR) 80 MG tablet [id number] Yes TAKE ONE TABLET BY MOUTH EVERY EVENING [name], MD Taking Active   Cholecalciferol (VITAMIN D) 2000 units CAPS [id number] Yes Take 2,000 Units by mouth daily. [provider] Taking Active Self  diltiazem (CARDIZEM CD) 180 MG 24 hr capsule [id number] Yes TAKE ONE CAPSULE BY MOUTH ONCE DAILY [name], MD Taking Active   ELIQUIS 5 MG TABS tablet [id number] Yes TAKE ONE TABLET BY MOUTH TWICE DAILY [name], MD Taking Active   fluticasone [facility]) 50 MCG/ACT nasal spray [id number] Yes USE TWO SPRAYS in each nostril ONCE DAILY [name], MD Taking Active   fluticasone (FLOVENT HFA) 110 MCG/ACT inhaler [id number] Yes Inhale 2 puffs into the lungs in the morning and at bedtime. Rinse after use [name], MD Taking Active   hydrocortisone (ANUSOL-HC) 25 MG suppository [id number] Yes Place 1 suppository (25 mg total) rectally 2 (two) times daily as needed for hemorrhoids or anal itching. [name], MD Taking Active   lansoprazole (PREVACID) 30 MG capsule [id number] Yes TAKE ONE CAPSULE BY MOUTH TWICE DAILY [name], MD Taking Active   lisinopril (ZESTRIL) 5 MG tablet [id number] Yes Stopped as of 11/11/21 [name], MD Taking Active   neomycin-polymyxin b-dexamethasone (MAXITROL) 3.5-10000-0.1 OINT [id number] Yes Place 1 Application into the right eye 2 (two) times daily. [provider] Taking Active   sertraline (ZOLOFT) 100 MG tablet [id number] Yes TAKE 1 AND 1/2 TABLETS BY MOUTH ONCE DAILY [name], MD Taking Active   triamcinolone cream (KENALOG) 0.1 % [id number] Yes apply one application TOPICALLY twice daily [name], MD Taking Active   vitamin B-12 (CYANOCOBALAMIN) 1000 MCG tablet [id number] Yes Take 1,000 mcg by mouth daily. [provider] Taking Active Self            Patient Active Problem List   Diagnosis Date Noted   Gall stones  12/23/2021   Abnormal MRI 11/15/2021   Urinary incontinence 11/15/2021   Gait abnormality 11/03/2021   Coordination impairment 11/03/2021   DOE (dyspnea on exertion) 08/04/2021   External hemorrhoid 05/10/2021   Sigmoid diverticulitis 02/11/2021   BRBPR (bright red blood per rectum) 02/10/2021   Change in stool 01/31/2021   Moderate aortic stenosis 11/20/2020   Other social stressor 09/27/2020   Cecal polyp    Diverticulosis    GI bleed 07/29/2020   Foot callus 12/30/2019   Gout 08/21/2017   Vision loss 07/30/2017   Headache 07/30/2017   Syncope 12/08/2016   Atrial fibrillation ([place]) 12/05/2016   Advance care planning 09/09/2016   Dizziness 01/20/2016   Fatigue 01/20/2016   Arthritis 04/13/2015   Rhinitis 02/25/2015   Osteoarthritis of both knees 11/13/2014   Primary osteoarthritis of both knees 11/13/2014   Cough 11/13/2013   Flank pain 11/21/2012   GERD (gastroesophageal reflux disease)    Back pain 05/04/2011   Hand pain 10/27/2010   Osteoporosis 08/23/2010   Dysuria 06/07/2010   UNSPECIFIED VITAMIN D DEFICIENCY 09/23/2008   DEPRESSION/ANXIETY 09/23/2008   Obesity (BMI 30.0-34.9) 06/21/2006   CORONARY ARTERY DISEASE, S/P PTCA 06/21/2006   Extrinsic asthma 06/21/2006   Essential hypertension 06/13/1999   Hyperlipidemia 03/15/1999    Immunization History  Administered Date(s) Administered  Fluad Quad(high Dose 65+) 03/18/2019, 12/26/2019, 01/29/2021, 12/21/2021   Influenza Split 12/23/2011   Influenza,inj,Quad PF,6+ Mos 01/30/2014, 04/09/2015, 04/08/2016, 12/05/2016   PFIZER(Purple Top)SARS-COV-2 Vaccination 05/30/2019, 06/20/2019   Pneumococcal Conjugate-13 11/13/2014   Pneumococcal Polysaccharide-23 10/05/2009   Td 09/23/2008    Conditions to be addressed/monitored:  Hypertension, Hyperlipidemia, Atrial Fibrillation, Coronary Artery Disease, GERD, Anxiety, and Gout  Care Plan : [place]  Updates made by Charlton Haws, [place] since  02/15/2022 12:00 AM     Problem: Hypertension, Hyperlipidemia, Atrial Fibrillation, Coronary Artery Disease, GERD, Anxiety, and Gout   Priority: High     Long-Range Goal: Disease Management   Start Date: 09/07/2020  Expected End Date: 11/25/2022  This Visit's Progress: On track  Recent Progress: On track  Priority: High  Note:   Current Barriers:  None identified  Pharmacist Clinical Goal(s):  Patient will contact provider office for questions/concerns as evidenced notation of same in electronic health record through collaboration with PharmD and provider.   Interventions: 1:1 collaboration with [name], MD regarding development and update of comprehensive plan of care as evidenced by provider attestation and co-signature Inter-disciplinary care team collaboration (see longitudinal plan of care) Comprehensive medication review performed; medication list updated in electronic medical record  Hypertension (BP goal <140/90) -Controlled - per pt report; she is not using lisinopril very often, maybe 1-2 times per week -Current home readings: 113/62, 125/70 -Denies hypotensive/hypertensive symptoms -Current treatment: Lisinopril 5 mg daily PRN SBP > 140 - Appropriate, Effective, Safe, Accessible Diltiazem CD 180 mg daily - Appropriate, Effective, Safe, Accessible Furosemide 20 mg daily PRN - Appropriate, Effective, Safe, Accessible -Medications previously tried: n/a  -Educated on BP goals and benefits of medications for prevention of heart attack, stroke and kidney damage; Proper BP monitoring technique; Symptoms of hypotension and importance of maintaining adequate hydration; -Counseled to monitor BP at home daily -Recommended to continue current medication  Hyperlipidemia / CAD (LDL goal < 70) -Not ideally controlled - LDL 112 (10/2020), previously 80. Based on refill dates, pt was probably not compliant with atorvastatin for the month preceding labwork; she is now compliant  with atorvastatin in daily pill packs -Hx CAD -Current treatment: Atorvastatin 80 mg daily - Appropriate, Query Effective -Medications previously tried: n/a  -Educated on Cholesterol goals; Benefits of statin for ASCVD risk reduction; -Recommended to continue current medication  Atrial Fibrillation (Goal: prevent stroke and major bleeding) -Controlled -CHADSVASC: 7; hx GI bleed/BRBPR -Current treatment: Diltiazem CD 180 mg daily - Appropriate, Effective, Safe, Accessible Eliquis 5 mg BID -Appropriate, Effective, Safe, Accessible -Medications previously tried: n/a -Counseled on importance of adherence to anticoagulant exactly as prescribed; bleeding risk associated with Eliquis and importance of self-monitoring for signs/symptoms of bleeding; avoidance of NSAIDs due to increased bleeding risk with anticoagulants; -Recommended to continue current medication  Asthma (Goal: control symptoms and prevent exacerbations) -Controlled - per pt report; she does not have albuterol because she had a reaction to it previously (sore throat, dizziness) -Pulmonary function testing: none in chart -Exacerbations requiring treatment in last 6 months: 0 -Current treatment  Flovent 110 mcg/act 2 puffs BID - Appropriate, Effective, Safe, Accessible Flonase nasal spray PRN -Appropriate, Effective, Safe, Accessible -Medications previously tried: albuterol  -Patient reports consistent use of maintenance inhaler -Frequency of rescue inhaler use: n/a -Counseled on Proper inhaler technique; Benefits of consistent maintenance inhaler use -Recommended to continue current medication  Depression/Anxiety (Goal: manage symptoms) -Controlled -PHQ9: 0 (11/11/21) -GAD7: not on file -Connected with PCP for mental  health support -Current treatment: Alprazolam 0.5 mg BID PRN - Appropriate, Effective, Safe, Accessible Sertraline 100 mg - 1.5 tab daily -Appropriate, Effective, Safe, Accessible -Medications previously  tried/failed: n/a -Educated on Benefits of medication for symptom control -Recommended to continue current medication  Gout (Goal: Prevent gout flares) -Controlled -Last Gout Flare: 10/2021; no recent uric acid on file -Current treatment  Colchicine 0.6 mg PRN -Appropriate, Effective, Safe, Accessible -Medications previously tried: n/a  -We discussed:  Counseled patient on low purine diet plan. Counseled patient to reduce consumption of high-fructose corn syrup, sweetened soft drinks, fruit juices, meat, and seafood. -Recommended to continue current medication  GERD (Goal: minimize symptoms of reflux ) -Controlled -Current treatment  Lansoprazole 30 mg BID - Appropriate, Effective, Safe, Accessible -Medications previously tried: none reported  -Hx of Bleeds/ulcers: Yes -Recommended to continue current medication  Patient Goals/Self-Care Activities Patient will:  - take medications as prescribed as evidenced by patient report and record review focus on medication adherence by pill packs check blood pressure daily, document, and provide at future appointments       Medication Assistance: None required.  Patient affirms current coverage meets needs.  Compliance/Adherence/Medication fill history: Care Gaps: None  Star-Rating Drugs: Atorvastatin - PDC 100% Lisinopril - PDC 100%  Medication Access: Within the past 30 days, how often has patient missed a dose of medication? 0 Is a pillbox or other method used to improve adherence? Yes  Factors that may affect medication adherence? lack of understanding of disease management Are meds synced by current pharmacy? Yes  Are meds delivered by current pharmacy? Yes  Does patient experience delays in picking up medications due to transportation concerns? No   [facility] Reviewed: Is patient disadvantaged to use [facility]?: No  Current Rx insurance plan: [employer] Name and location of Current pharmacy:  [facility] [place], [place] - [address] [place] [address] Phone: [phone] Fax: [phone]  [place], [place] - [place] [address] Phone: [phone] Fax: [phone]  [facility] services reviewed with patient today?: Yes  30-day Packs (Delivery 02/14/22) Sertraline 166m  take 1 1/2 tablets daily breakfast Lansoprazole 361mTake 1 tablet at breakfast and 1 tablet evening meal Diltiazem 18051m4 hr capsule 1 capsule by mouth daily breakfast  Eliquis 5mg51mke 1 tablet 2 times daily breakfast and evening meal Atorvastatin 80mg65mlet 1 tablet by mouth daily breakfast   Vial Medications:  Flovent 110/mcg 2 puffs in the morning and bedtime  Care Plan and Follow Up Patient Decision:  Patient agrees to Care Plan and Follow-up.  Plan: Telephone follow up appointment with care management team member scheduled for:  3 month  LindsCharlene BrookermD, BCACP Clinical Pharmacist [phone]

## 2022-02-24 ENCOUNTER — Other Ambulatory Visit: Payer: Self-pay | Admitting: Family Medicine

## 2022-02-24 NOTE — Telephone Encounter (Signed)
Refill request for alprazolam 0.5 mg tablet   LOV - 12/21/21 Next OV - not scheduled Last refill - 01/26/22 #60/1

## 2022-03-02 ENCOUNTER — Telehealth: Payer: Self-pay

## 2022-03-02 NOTE — Progress Notes (Signed)
Chronic Care Management Pharmacy Assistant   Name: [name]  MRN: [id number] DOB: 20-May-1935  Reason for Encounter: CCM (Medication Adherence and Delivery Coordination)  Recent office visits:  None since last CCM contact  Recent consult visits:  None since last CCM contact  Hospital visits:  None since last CCM contact  Medications: Outpatient Encounter Medications as of 03/02/2022  Medication Sig   ALPRAZolam (XANAX) 0.5 MG tablet TAKE ONE TABLET BY MOUTH twice daily AS NEEDED FOR ANXIETY   atorvastatin (LIPITOR) 80 MG tablet TAKE ONE TABLET BY MOUTH EVERY EVENING   Cholecalciferol (VITAMIN D) 2000 units CAPS Take 2,000 Units by mouth daily.   diltiazem (CARDIZEM CD) 180 MG 24 hr capsule TAKE ONE CAPSULE BY MOUTH ONCE DAILY   ELIQUIS 5 MG TABS tablet TAKE ONE TABLET BY MOUTH TWICE DAILY   fluticasone (FLONASE) 50 MCG/ACT nasal spray USE TWO SPRAYS in each nostril ONCE DAILY   fluticasone (FLOVENT HFA) 110 MCG/ACT inhaler Inhale 2 puffs into the lungs in the morning and at bedtime. Rinse after use   hydrocortisone (ANUSOL-HC) 25 MG suppository Place 1 suppository (25 mg total) rectally 2 (two) times daily as needed for hemorrhoids or anal itching.   lansoprazole (PREVACID) 30 MG capsule TAKE ONE CAPSULE BY MOUTH TWICE DAILY   lisinopril (ZESTRIL) 5 MG tablet Stopped as of 11/11/21   neomycin-polymyxin b-dexamethasone (MAXITROL) 3.5-10000-0.1 OINT Place 1 Application into the right eye 2 (two) times daily.   sertraline (ZOLOFT) 100 MG tablet TAKE 1 AND 1/2 TABLETS BY MOUTH ONCE DAILY   triamcinolone cream (KENALOG) 0.1 % apply one application TOPICALLY twice daily   vitamin B-12 (CYANOCOBALAMIN) 1000 MCG tablet Take 1,000 mcg by mouth daily.   No facility-administered encounter medications on file as of 03/02/2022.   BP Readings from Last 3 Encounters:  02/15/22 125/70  01/01/22 (!) 157/68  12/21/21 136/70    Pulse Readings from Last 3 Encounters:  01/01/22 (!) 53   12/21/21 76  12/18/21 (!) 54    Lab Results  Component Value Date/Time   HGBA1C 5.9 10/17/2011 11:51 AM   Lab Results  Component Value Date   CREATININE 0.89 01/01/2022   BUN 21 01/01/2022   GFR 58.99 (L) 11/03/2021   GFRNONAA >60 01/01/2022   GFRAA >60 11/21/2019   NA 137 01/01/2022   K 4.1 01/01/2022   CALCIUM 8.9 01/01/2022   CO2 26 01/01/2022   Last adherence delivery date: 02/14/2022  Patient is due for next adherence delivery on: 03/15/2022  Spoke with patient on 03/02/2022 reviewed medications and coordinated delivery.  This delivery to include: Adherence Packaging  30 Days  Packs: Sertraline '100mg'$   take 1 1/2 tablets daily breakfast, evening meal  Lansoprazole '30mg'$  Take 1 tablet at breakfast and 1 tablet evening meal Diltiazem '180mg'$  24 hr capsule 1 capsule by mouth daily breakfast  Atorvastatin '80mg'$  Tablet 1 tablet by mouth daily breakfast Eliquis '5mg'$  take 1 tablet 2 times daily breakfast and evening meal   Vial: Fluticasone propionate 110/mcg 2 puffs in the morning and bedtime   Fluticasone (Flonase) 50 mcg nasal spray   Patient declined the following medications this month: None declined   Any concerns about your medications? No   How often do you forget or accidentally miss a dose? Rarely   Do you use a pillbox? No  Is patient in packaging Yes  If yes  What is the date on your next pill pack? 03/02/2022  Any concerns or issues with your  packaging? No  No refill request needed.  Confirmed delivery date of 03/15/2022, advised patient that pharmacy will contact them the morning of delivery.   Recent blood pressure readings are as follows:  Date Blood Pressure Pulse 12/12 190/56   56  198/86   56  133/57   67 12/03 145/70   69 I have asked patient to take their blood pressure daily and keep a log. Advised patient I would call back on 03/10/2022 for log. Patient verbalized understanding and agreed.   Annual wellness visit in last year? No Most  Recent BP reading: 125/70 on 02/15/2022  Cycle dispensing form sent to [facility], CPP for review.  Next CCM appointment: 05/17/2022  [name], CPP notified  [name], [place] Clinical Pharmacy Assistant [phone]

## 2022-03-21 DIAGNOSIS — H35031 Hypertensive retinopathy, right eye: Secondary | ICD-10-CM | POA: Diagnosis not present

## 2022-03-21 DIAGNOSIS — H43813 Vitreous degeneration, bilateral: Secondary | ICD-10-CM | POA: Diagnosis not present

## 2022-03-21 DIAGNOSIS — H353211 Exudative age-related macular degeneration, right eye, with active choroidal neovascularization: Secondary | ICD-10-CM | POA: Diagnosis not present

## 2022-03-21 DIAGNOSIS — H353122 Nonexudative age-related macular degeneration, left eye, intermediate dry stage: Secondary | ICD-10-CM | POA: Diagnosis not present

## 2022-03-24 ENCOUNTER — Ambulatory Visit: Payer: Self-pay

## 2022-03-24 NOTE — Patient Outreach (Signed)
  Care Coordination   03/24/2022 Name: [name] MRN: [id number] DOB: 30-Apr-1935   Care Coordination Outreach Attempts:  An unsuccessful telephone outreach was attempted for a scheduled appointment today.  Follow Up Plan:  Additional outreach attempts will be made to offer the patient care coordination information and services.   Encounter Outcome:  No Answer   Care Coordination Interventions:  No, not indicated    Quinn Plowman [facility] [place] [phone] direct line

## 2022-03-30 ENCOUNTER — Telehealth: Payer: Self-pay | Admitting: *Deleted

## 2022-03-30 NOTE — Progress Notes (Signed)
  Care Coordination Note  03/30/2022 Name: EMILINA SMARR MRN: [id number] DOB: Jan 28, 1936  HEDI BARKAN is a 87 y.o. year old female who is a primary care patient of [name], MD and is actively engaged with the care management team. I reached out to [name] by phone today to assist with re-scheduling a follow up visit with the RN Case Manager  Follow up plan: Telephone appointment with care management team member scheduled for: 03/31/2022  Julian Hy, [place] Direct Dial: [phone]

## 2022-03-31 ENCOUNTER — Ambulatory Visit: Payer: Self-pay

## 2022-03-31 NOTE — Patient Outreach (Signed)
  Care Coordination   Follow Up Visit Note   04/01/2022 Name: [name] MRN: [id number] DOB: [birth year]  [name] is a 87 y.o. year old female who sees [name], MD for primary care. I spoke with  [name] by phone today.  What matters to the patients health and wellness today? " Keeping blood pressure under control."    Goals Addressed             This Visit's Progress    Patient Stated: " Managing my health"       Care Coordination Interventions: Evaluation of current treatment plan related to high blood pressure / abdominal pain and patient's adherence to plan as established by provider:  Patient reports having a blood pressure of 168/80 and 164/80.  She states her blood pressure came down to 131/63 after taking blood pressure medication. Patient states her doctor advised her if her blood pressure did not come down she can take her lisinopril.  Patient states overall she feels she is doing well.  Patient denies any recent falls.  Advised patient to continue to eat high fiber foods and fresh fruit, vegetables, whole grains, lean meat/fish/poultry, and low fat dairy products.  Advised patient to avoid foods such as fried, deep fried or buttered foods, sausage, salami, bacon, high processed foods, high fat dairy foods.  Reviewed medications with patient and discussed importance of taking medications as prescribed by her doctor Reviewed scheduled/upcoming provider appointments  Advised patient to continue to monitor blood pressure daily and record. Advised to notify provider for ongoing elevated blood pressure readings.  Advised to drink plenty of water to remain hydrated.           SDOH assessments and interventions completed:  No     Care Coordination Interventions:  Yes, provided   Follow up plan: Follow up call scheduled for 05/13/22    Encounter Outcome:  Pt. Visit Completed   Quinn Plowman RN,BSN,CCM [place] [phone] direct  line

## 2022-04-01 DIAGNOSIS — I350 Nonrheumatic aortic (valve) stenosis: Secondary | ICD-10-CM | POA: Diagnosis not present

## 2022-04-01 DIAGNOSIS — E782 Mixed hyperlipidemia: Secondary | ICD-10-CM | POA: Diagnosis not present

## 2022-04-01 DIAGNOSIS — I10 Essential (primary) hypertension: Secondary | ICD-10-CM | POA: Diagnosis not present

## 2022-04-01 DIAGNOSIS — I2510 Atherosclerotic heart disease of native coronary artery without angina pectoris: Secondary | ICD-10-CM | POA: Insufficient documentation

## 2022-04-01 DIAGNOSIS — I480 Paroxysmal atrial fibrillation: Secondary | ICD-10-CM | POA: Diagnosis not present

## 2022-04-04 ENCOUNTER — Telehealth: Payer: Self-pay

## 2022-04-04 NOTE — Progress Notes (Signed)
Care Management & Coordination Services Pharmacy Team  Reason for Encounter: Medication coordination and delivery  Contacted patient on 04/04/2022 to discuss medications   Recent office visits:  None since last CCM contact  Recent consult visits:  None since last CCM contact  Hospital visits:  None since last CCM contact  Medications: Outpatient Encounter Medications as of 04/04/2022  Medication Sig   ALPRAZolam (XANAX) 0.5 MG tablet TAKE ONE TABLET BY MOUTH twice daily AS NEEDED FOR ANXIETY   atorvastatin (LIPITOR) 80 MG tablet TAKE ONE TABLET BY MOUTH EVERY EVENING   Cholecalciferol (VITAMIN D) 2000 units CAPS Take 2,000 Units by mouth daily.   diltiazem (CARDIZEM CD) 180 MG 24 hr capsule TAKE ONE CAPSULE BY MOUTH ONCE DAILY   ELIQUIS 5 MG TABS tablet TAKE ONE TABLET BY MOUTH TWICE DAILY   fluticasone (FLONASE) 50 MCG/ACT nasal spray USE TWO SPRAYS in each nostril ONCE DAILY   fluticasone (FLOVENT HFA) 110 MCG/ACT inhaler Inhale 2 puffs into the lungs in the morning and at bedtime. Rinse after use   hydrocortisone (ANUSOL-HC) 25 MG suppository Place 1 suppository (25 mg total) rectally 2 (two) times daily as needed for hemorrhoids or anal itching.   lansoprazole (PREVACID) 30 MG capsule TAKE ONE CAPSULE BY MOUTH TWICE DAILY   lisinopril (ZESTRIL) 5 MG tablet Stopped as of 11/11/21   neomycin-polymyxin b-dexamethasone (MAXITROL) 3.5-10000-0.1 OINT Place 1 Application into the right eye 2 (two) times daily.   sertraline (ZOLOFT) 100 MG tablet TAKE 1 AND 1/2 TABLETS BY MOUTH ONCE DAILY   triamcinolone cream (KENALOG) 0.1 % apply one application TOPICALLY twice daily   vitamin B-12 (CYANOCOBALAMIN) 1000 MCG tablet Take 1,000 mcg by mouth daily.   No facility-administered encounter medications on file as of 04/04/2022.   BP Readings from Last 3 Encounters:  02/15/22 125/70  01/01/22 (!) 157/68  12/21/21 136/70    Pulse Readings from Last 3 Encounters:  01/01/22 (!) 53  12/21/21  76  12/18/21 (!) 54    Lab Results  Component Value Date/Time   HGBA1C 5.9 10/17/2011 11:51 AM   Lab Results  Component Value Date   CREATININE 0.89 01/01/2022   BUN 21 01/01/2022   GFR 58.99 (L) 11/03/2021   GFRNONAA >60 01/01/2022   GFRAA >60 11/21/2019   NA 137 01/01/2022   K 4.1 01/01/2022   CALCIUM 8.9 01/01/2022   CO2 26 01/01/2022    Last adherence delivery date: 03/15/2022      Patient is due for next adherence delivery on: 04/14/2022  Spoke with patient on 04/04/2022 reviewed medications and coordinated delivery.  This delivery to include: Adherence Packaging  30 Days  Packs: Sertraline '100mg'$   take 1 1/2 tablets daily breakfast, evening meal  Lansoprazole '30mg'$  Take 1 tablet at breakfast and 1 tablet evening meal Diltiazem '180mg'$  24 hr capsule 1 capsule by mouth daily breakfast  Atorvastatin '80mg'$  Tablet 1 tablet by mouth daily breakfast Eliquis '5mg'$  take 1 tablet 2 times daily breakfast and evening meal   Vial: Fluticasone propionate 110/mcg 2 puffs in the morning and bedtime   Fluticasone (Flonase) 50 mcg nasal spray  Patient declined the following medications this month: None declined  Patient stated her insurance changed to [employer]. I advised her [facility] would contact her for the updated information.    Any concerns about your medications? No   How often do you forget or accidentally miss a dose? Rarely   Do you use a pillbox? No  No refill request needed.  Confirmed delivery date of 04/14/2022, advised patient that pharmacy will contact them the morning of delivery.   Recent blood pressure readings are as follows: Date Blood Pressure  Pulse 01/16 127/50    58 01/15 137/57    54 01/14 117/46    60 01/13 122/49    51  Cycle dispensing form sent to [name], PharmD for review.  [name], CMA

## 2022-04-13 ENCOUNTER — Telehealth: Payer: Self-pay | Admitting: Family Medicine

## 2022-04-13 ENCOUNTER — Telehealth: Payer: Self-pay

## 2022-04-13 NOTE — Telephone Encounter (Signed)
See following note.

## 2022-04-13 NOTE — Telephone Encounter (Signed)
I trust and appreciate the help if all my colleagues here in clinic.  I have been out of clinic unexpectedly with an illness.  Please triage patient about any emergent symptoms per Dr. Synthia Innocent note and please set up patient whenever possible.  I would prefer to be seen sooner rather than later, even if not by me.  Thanks.

## 2022-04-13 NOTE — Telephone Encounter (Signed)
Sending to triage nurse to check on reported blood in stool, tingling in arm.

## 2022-04-13 NOTE — Telephone Encounter (Addendum)
Spoke to patient by telephone and was advised that she has been having off and on numbness and tingling in her left arm. Patient stated this started last Thursday after her appointment with the cardiologist. Patient stated that it comes and goes. Patient denies chest pain or SOB. Patient stated that she only wants to see [name] because he understands her and helps her. Patient denies any other symptoms at this time.Patient scheduled for an appointment tomorrow 04/14/22 at 2:00 pm with [name]. Patient was given ER precautions and she verbalized understanding.

## 2022-04-13 NOTE — Telephone Encounter (Signed)
Left message on voicemail for patient to call the office back. 

## 2022-04-13 NOTE — Telephone Encounter (Signed)
Noted. Thanks.

## 2022-04-13 NOTE — Telephone Encounter (Signed)
Patient was  seen her on 04/12/2022 by [facility] NP,and did a circulation screen test on her leg ,that shows severe circulation decrease to both of her legs. She has peripheral arterial disease. Company will send over full report of visit,but it will take a week or so.

## 2022-04-13 NOTE — Telephone Encounter (Addendum)
Sounds like she only wants to see PCP. Please schedule OV with PCP at his next available appointment.  Ensure no shortness of breath, chest pain or dizziness with noted blood.

## 2022-04-13 NOTE — Progress Notes (Signed)
Patient was speaking to [facility] pharmacy and asked them to have me call her. I spoke with patient immediately after receiving the message.  [employer] nurse came by yesterday to see her. Nurse stated to patient that she felt something in her feet. Nurse said she had a blockage of some sort and her foot was not getting enough blood. Patient stated she saw the heart doctor on 03/31/2022 and had an Echo. Cardiology told her she is still doing good.    Patient stated her left arm has been painful with a tingling and burning under skin from hand to shoulder. Pain will start with a cramp in hand. Went to heart center on 03/31/2022 - did not tell them as it started the next morning (04/01/2022). Happening off and on ever since. Pain was happening while on the phone with patient. She stated it was a pain level of 6/10, but will get to 10/10.  Patient also stated the whole month of December when she went to the bathroom her bowl movements were a different color; not brown like normal. Stated she has been having blood in her stool and when she wipes. Water in the toilet bowl has been pink/red when she urinates with no bowel movement.  It has still been happening for the month of January. Yesterday she had red in her stool; patient wears pads and will have a little amount of blood on her pads between bathroom trips. Patient reports no pain. Patient stated she only feels comfortable with [name] and does not want to see anyone else for this issue. Patient stated she is very scared with the nurse telling her about the blockage she found and the blood when she goes to the bathroom.   She has expressed to [name]) how scared she is. [name] is going to come to her house today to visit.  [name], PharmD notified  [name], [place] Clinical Pharmacy Assistant [phone]

## 2022-04-14 ENCOUNTER — Ambulatory Visit (INDEPENDENT_AMBULATORY_CARE_PROVIDER_SITE_OTHER): Payer: 59 | Admitting: Family Medicine

## 2022-04-14 ENCOUNTER — Encounter: Payer: Self-pay | Admitting: Family Medicine

## 2022-04-14 ENCOUNTER — Ambulatory Visit (INDEPENDENT_AMBULATORY_CARE_PROVIDER_SITE_OTHER)
Admission: RE | Admit: 2022-04-14 | Discharge: 2022-04-14 | Disposition: A | Discharge status: Home / Self Care | Payer: 59 | Source: Ambulatory Visit | Attending: Family Medicine | Admitting: Family Medicine

## 2022-04-14 VITALS — BP 124/70 | HR 56 | Temp 97.7°F | Ht 66.0 in | Wt 194.0 lb

## 2022-04-14 DIAGNOSIS — R202 Paresthesia of skin: Secondary | ICD-10-CM

## 2022-04-14 DIAGNOSIS — R0989 Other specified symptoms and signs involving the circulatory and respiratory systems: Secondary | ICD-10-CM

## 2022-04-14 NOTE — Progress Notes (Signed)
She had home eval for PAD, she brought in papers about that.  No leg cramping with walking. No CP.    L arm sx.  Started on the 18th after laying down.  Occ burning from the L thumb up the flexor side of the forearm, to the L shoulder.  Not on extensor side of L forearm.  No R arm sx.  Episodic sx.  No grip change.  No weakness.  No trauma.  No leg sx.  No neck pain.  No sx with neck rotation.  No rash.  No vesicles.    H/o Left Thumb Surgery; and Left carpal tunnel release and open reduction and internal fixation of left distal radius (Left, 02/04/14).   Meds, vitals, and allergies reviewed.   ROS: Per HPI unless specifically indicated in ROS section   Nad Ncat Neck supple, no LA Rrr SEM noted.  Ctab Abd soft, not ttp No BLE edema . Dec pulse in B feet.  Normal brisk cap refill and sensation.   She has upper L arm sx similar to prior with neck extension.   Normal s/s BUE o/w.    30 minutes were devoted to patient care in this encounter (this includes time spent reviewing the patient's file/history, interviewing and examining the patient, counseling/reviewing plan with patient).

## 2022-04-14 NOTE — Patient Instructions (Addendum)
Let me know if you don't get a call about setting up the blood vessel tests on your legs.  Take care.  Glad to see you. Go to the lab on the way out.   If you have mychart we'll likely use that to update you.

## 2022-04-17 DIAGNOSIS — R0989 Other specified symptoms and signs involving the circulatory and respiratory systems: Secondary | ICD-10-CM | POA: Insufficient documentation

## 2022-04-17 DIAGNOSIS — R202 Paresthesia of skin: Secondary | ICD-10-CM | POA: Insufficient documentation

## 2022-04-17 NOTE — Assessment & Plan Note (Signed)
Her surgical history is noted, with left thumb surgery and left carpal tunnel release.  She is also had ORIF left distal radius.  My concern is if she could also have symptoms coming from the neck.  Check plain films today.  See notes on imaging.

## 2022-04-17 NOTE — Assessment & Plan Note (Signed)
Arterial ultrasound ordered for lower extremities.  Rationale discussed with patient.  See after visit summary.  No skin breakdown and okay for outpatient follow-up.

## 2022-04-18 ENCOUNTER — Other Ambulatory Visit: Payer: Self-pay | Admitting: Family Medicine

## 2022-04-18 DIAGNOSIS — R202 Paresthesia of skin: Secondary | ICD-10-CM

## 2022-04-18 MED ORDER — PREDNISONE 10 MG PO TABS: ORAL_TABLET | ORAL | 0 refills | Status: DC

## 2022-04-25 ENCOUNTER — Telehealth: Payer: Self-pay | Admitting: Family Medicine

## 2022-04-25 NOTE — Telephone Encounter (Signed)
Okay---I will check her tomorrow at the visit

## 2022-04-25 NOTE — Telephone Encounter (Addendum)
I spoke with pt; pt said for few days since last saw Dr Damita Dunnings on 04/14/22 pt has had pain in lt lower arm to shoulder sharp pain on and off; pt said some days pain comes and goes from lt hand to lt shoulder.  Pt said she has three prednisone 10 mg taking 1 daily left. Pt has already taken prednisone today but pt said she really cannot tell prednisone has helped lt arm pain. Pt said she cannot lift anything with her lt hand because it makes the lt arm hurt so bad.pt then said any movement can cause the lt arm to hurt.NO CP or SOB. Pt said she has a knot the size of a quarter on the bend of lt arm. No redness or red streaks on lt arm seen. Swelling of lt arm comes and goes. No known injury. Pt has been taking eliquis 5 mg bid and pt said she has not missed a pill.Pt does not have ortho appt until 05/31/22. Pt said she cannot wait that long to get her arm taken care of. Dr Damita Dunnings has available appt on 04/28/22 but pt said she will see someone else at [facility] who has sooner appt. Pt does not want to go to UC or ED and pt cannot come to office today due to transportation issues. Pt scheduled appt with Dr Silvio Pate on 04/26/22 at 10:15 with UC & ED precautions given and pt voiced understanding. Sending note to Dr Damita Dunnings, Dr Silvio Pate and Silvio Pate pool.

## 2022-04-25 NOTE — Telephone Encounter (Signed)
Patient stated that she is taking the prednisone. She has a few pills left. She stated that her arm is still hurting.She can barely lift it. Last night she wasn't able to lift it at all.She said there 2 knots on it,and asked could it possibly be broken?

## 2022-04-25 NOTE — Telephone Encounter (Signed)
Please triage patient about this.  I suspect she has a muscle spasm where she feels the knots.  Please see what details you can get and let me know.    When is she seeing ortho?  Please let me know.    Thanks.

## 2022-04-26 ENCOUNTER — Encounter: Payer: Self-pay | Admitting: Family Medicine

## 2022-04-26 ENCOUNTER — Ambulatory Visit (INDEPENDENT_AMBULATORY_CARE_PROVIDER_SITE_OTHER): Payer: 59 | Admitting: Family Medicine

## 2022-04-26 ENCOUNTER — Ambulatory Visit: Payer: 59 | Admitting: Internal Medicine

## 2022-04-26 VITALS — BP 150/80 | HR 72 | Temp 97.5°F | Ht 66.0 in | Wt 194.5 lb

## 2022-04-26 DIAGNOSIS — M79602 Pain in left arm: Secondary | ICD-10-CM

## 2022-04-26 DIAGNOSIS — I10 Essential (primary) hypertension: Secondary | ICD-10-CM | POA: Diagnosis not present

## 2022-04-26 DIAGNOSIS — J4520 Mild intermittent asthma, uncomplicated: Secondary | ICD-10-CM | POA: Diagnosis not present

## 2022-04-26 DIAGNOSIS — M5412 Radiculopathy, cervical region: Secondary | ICD-10-CM | POA: Diagnosis not present

## 2022-04-26 MED ORDER — PREDNISONE 20 MG PO TABS: ORAL_TABLET | ORAL | 0 refills | Status: DC

## 2022-04-26 NOTE — Assessment & Plan Note (Signed)
Chronic, she notes today that she feels her Flovent inhaler is causing her to cough more and produce more mucus.  But it is unclear if she is trying to say that this is a different inhaler than her usual that is causing problems.  She does also states she has been on Flovent for a long time.  She states that when she does not use it she feels better.  She states overall her breathing is doing very well and she is no not short of breath.  On lung exam though there are scattered wheezes.  This would likely improve during the prednisone course.  She is a difficult historian to follow and given I do not know her asthma history, I will defer to her PCP for further recommendations on her asthma controller inhaler.  She states that she  has stopped Flovent.

## 2022-04-26 NOTE — Telephone Encounter (Signed)
Noted. Thanks.

## 2022-04-26 NOTE — Patient Instructions (Addendum)
Complete higher dose prednisone taper.. take only in AM.  Limit neck movement.  Keep appt as planned with [facility] as planned.  Call if not improving in 3 days as discussed.

## 2022-04-26 NOTE — Assessment & Plan Note (Signed)
Her blood pressure was elevated in the office today.  Per previous recommendations of PCP encouraged her to take her blood pressure medication lisinopril 5 mg p.o. daily in addition to her Cardizem 180 mg daily

## 2022-04-26 NOTE — Assessment & Plan Note (Addendum)
Acute.  Reviewed cervical spine x-rays.  Treat with prednisone 20 mg 6-day taper, 60/40/20 Patient will call if her symptoms are not significantly improved after the first 2 to 3 days of the high-dose taper. She does have orthopedic referral already in process although no appointment date has been set.

## 2022-04-26 NOTE — Assessment & Plan Note (Signed)
Acute, I agree with [name] that left arm pain is most likely secondary to cervical spine in origin, less likely secondary to shoulder pathology. Given she had minimal response to low-dose prednisone I have placed her on an increased dose of prednisone.  She does have a focal area of tenderness separate from the majority of her burning pain .she localizes this is this pain in her antecubital fossa.  This area seems more consistent with possible superficial phlebitis that has been intermittent in the past.  There is no mass, no sign of infection.  There is no distal arm swelling suggesting DVT.

## 2022-04-26 NOTE — Progress Notes (Signed)
Patient ID: [name], female    DOB: Aug 31, 1935, 87 y.o.   MRN: [id number]  This visit was conducted in person.  BP (!) 150/80   Pulse 72   Temp (!) 97.5 F (36.4 C) (Temporal)   Ht 5' 6"$  (1.676 m)   Wt 194 lb 8 oz (88.2 kg)   SpO2 94%   BMI 31.39 kg/m    Vitals:   04/26/22 1442 04/26/22 1539  BP: (!) 152/72 (!) 150/80  Pulse: 72   Temp: (!) 97.5 F (36.4 C)   TempSrc: Temporal   SpO2: 94%   Weight: 194 lb 8 oz (88.2 kg)   Height: 5' 6"$  (1.676 m)     CC:  Chief Complaint  Patient presents with   Arm Pain    Left-Seen [name] 04/14/22    Subjective:   HPI: [name] is a 87 y.o. female patient of Dr. Josefine Class with history of high cholesterol, atrial fibrillation, hypertension presenting on day 04/26/2022 for Arm Pain (Left-Seen [name] 04/14/22)  She saw [name] on April 18, 2022 with left arm burning, felt to be due to possible cervical radiculopathy. She was treated with  verylow dose prednisone taper...  2 a day for 5 days, then 1 a day for 5 days. Pain had started after 1/18.[name]  no fall.. pain with raising arm above head. DG cervical spine showed mild degenerative endplate changes with facet arthropathy resulting in bilateral neural aminal foraminal narrowing. She was referred to orthopedics.   Today she reports continued left arm pain in left shoulder radiates up to left neck and downto left Burning heat pain. She feels prednisone has helped minimally. She cannot lift her arm given the amount of pain she has associated. She has noted a knot in the bend of left arm about the size of a quarter.  No redness or associated heat.. noted after blood drawn in  12/2021.[name] got better than has come back some  Reviewed last office visit from [name] from November 11, 2021 and again from December 21, 2021. Blood pressure above goal in office today despite diltiazem 180 mg p.o. daily,  No longer on lisinopril 5 mg daily as stopped November 11, 2021 given  lightheadedness on standing.     S till also having intermittent pain in legs, alternating... has upcoming ABi test pending.    She  has been having some increased mucus.[name] looks.. no worsening in SOB or wheeze.  She feels flovent is making her cough more.. she thinks she may hold it.  Using albuterol prn   Relevant past medical, surgical, family and social history reviewed and updated as indicated. Interim medical history since our last visit reviewed. Allergies and medications reviewed and updated. Outpatient Medications Prior to Visit  Medication Sig Dispense Refill   ALPRAZolam (XANAX) 0.5 MG tablet TAKE ONE TABLET BY MOUTH twice daily AS NEEDED FOR ANXIETY 60 tablet 1   atorvastatin (LIPITOR) 80 MG tablet TAKE ONE TABLET BY MOUTH EVERY EVENING 180 tablet 1   Cholecalciferol (VITAMIN D) 2000 units CAPS Take 2,000 Units by mouth daily.     diltiazem (CARDIZEM CD) 180 MG 24 hr capsule TAKE ONE CAPSULE BY MOUTH ONCE DAILY 90 capsule 2   ELIQUIS 5 MG TABS tablet TAKE ONE TABLET BY MOUTH TWICE DAILY 60 tablet 5   fluticasone (FLONASE) 50 MCG/ACT nasal spray USE TWO SPRAYS in each nostril ONCE DAILY 16 g 11   fluticasone (FLOVENT HFA) 110 MCG/ACT  inhaler Inhale 2 puffs into the lungs in the morning and at bedtime. Rinse after use 3 each 3   hydrocortisone (ANUSOL-HC) 25 MG suppository Place 1 suppository (25 mg total) rectally 2 (two) times daily as needed for hemorrhoids or anal itching. 12 suppository 0   lansoprazole (PREVACID) 30 MG capsule TAKE ONE CAPSULE BY MOUTH TWICE DAILY 60 capsule 11   lisinopril (ZESTRIL) 5 MG tablet Stopped as of 11/11/21 180 tablet 1   neomycin-polymyxin b-dexamethasone (MAXITROL) 3.5-10000-0.1 OINT Place 1 Application into the right eye 2 (two) times daily.     sertraline (ZOLOFT) 100 MG tablet TAKE 1 AND 1/2 TABLETS BY MOUTH ONCE DAILY 45 tablet 11   triamcinolone cream (KENALOG) 0.1 % apply one application TOPICALLY twice daily 30 g 5   vitamin B-12  (CYANOCOBALAMIN) 1000 MCG tablet Take 1,000 mcg by mouth daily.     predniSONE (DELTASONE) 10 MG tablet Take 2 a day for 5 days, then 1 a day for 5 days, with food. Don't take with aleve/ibuprofen. 15 tablet 0   No facility-administered medications prior to visit.     Per HPI unless specifically indicated in ROS section below Review of Systems  Constitutional:  Negative for fatigue and fever.  HENT:  Negative for congestion.   Eyes:  Negative for pain.  Respiratory:  Positive for cough. Negative for shortness of breath.   Cardiovascular:  Negative for chest pain, palpitations and leg swelling.  Gastrointestinal:  Negative for abdominal pain.  Genitourinary:  Negative for dysuria and vaginal bleeding.  Musculoskeletal:  Positive for arthralgias and myalgias. Negative for back pain.  Neurological:  Positive for weakness. Negative for syncope, light-headedness and headaches.  Psychiatric/Behavioral:  Negative for dysphoric mood.    Objective:  BP (!) 150/80   Pulse 72   Temp (!) 97.5 F (36.4 C) (Temporal)   Ht 5' 6"$  (1.676 m)   Wt 194 lb 8 oz (88.2 kg)   SpO2 94%   BMI 31.39 kg/m   Wt Readings from Last 3 Encounters:  04/26/22 194 lb 8 oz (88.2 kg)  04/14/22 194 lb (88 kg)  01/01/22 184 lb (83.5 kg)      Physical Exam Pulmonary:     Effort: Pulmonary effort is normal.     Breath sounds: Wheezing present.     Comments:  scattered wheeze Musculoskeletal:     Left upper arm: Normal.     Right elbow: Normal.     Left elbow: Normal.     Right forearm: Normal.     Left forearm: Tenderness present.       Arms:     Comments: Small area of focal fatty tissue on left medial antecubital fossa, no nodules palpated similar to right side although tender per patient.  No prominent vessels       Results for orders placed or performed during the hospital encounter of 01/01/22  Urinalysis, Routine w reflex microscopic  Result Value Ref Range   Color, Urine STRAW (A) YELLOW    APPearance CLEAR (A) CLEAR   Specific Gravity, Urine 1.020 1.005 - 1.030   pH 6.0 5.0 - 8.0   Glucose, UA NEGATIVE NEGATIVE mg/dL   Hgb urine dipstick NEGATIVE NEGATIVE   Bilirubin Urine NEGATIVE NEGATIVE   Ketones, ur NEGATIVE NEGATIVE mg/dL   Protein, ur NEGATIVE NEGATIVE mg/dL   Nitrite NEGATIVE NEGATIVE   Leukocytes,Ua NEGATIVE NEGATIVE  CBC with Differential  Result Value Ref Range   WBC 6.7 4.0 - 10.5 K/uL  RBC 4.54 3.87 - 5.11 MIL/uL   Hemoglobin 12.5 12.0 - 15.0 g/dL   HCT 39.6 36.0 - 46.0 %   MCV 87.2 80.0 - 100.0 fL   MCH 27.5 26.0 - 34.0 pg   MCHC 31.6 30.0 - 36.0 g/dL   RDW 14.0 11.5 - 15.5 %   Platelets 185 150 - 400 K/uL   nRBC 0.0 0.0 - 0.2 %   Neutrophils Relative % 65 %   Neutro Abs 4.4 1.7 - 7.7 K/uL   Lymphocytes Relative 26 %   Lymphs Abs 1.7 0.7 - 4.0 K/uL   Monocytes Relative 7 %   Monocytes Absolute 0.5 0.1 - 1.0 K/uL   Eosinophils Relative 2 %   Eosinophils Absolute 0.1 0.0 - 0.5 K/uL   Basophils Relative 0 %   Basophils Absolute 0.0 0.0 - 0.1 K/uL   Immature Granulocytes 0 %   Abs Immature Granulocytes 0.03 0.00 - 0.07 K/uL  Comprehensive metabolic panel  Result Value Ref Range   Sodium 137 135 - 145 mmol/L   Potassium 4.1 3.5 - 5.1 mmol/L   Chloride 104 98 - 111 mmol/L   CO2 26 22 - 32 mmol/L   Glucose, Bld 106 (H) 70 - 99 mg/dL   BUN 21 8 - 23 mg/dL   Creatinine, Ser 0.89 0.44 - 1.00 mg/dL   Calcium 8.9 8.9 - 10.3 mg/dL   Total Protein 7.5 6.5 - 8.1 g/dL   Albumin 3.7 3.5 - 5.0 g/dL   AST 26 15 - 41 U/L   ALT 20 0 - 44 U/L   Alkaline Phosphatase 91 38 - 126 U/L   Total Bilirubin 0.8 0.3 - 1.2 mg/dL   GFR, Estimated >60 >60 mL/min   Anion gap 7 5 - 15  Lipase, blood  Result Value Ref Range   Lipase 35 11 - 51 U/L  Sample to Blood Bank  Result Value Ref Range   Blood Bank Specimen SAMPLE AVAILABLE FOR TESTING    Sample Expiration      01/04/2022,2359 Performed at [facility], [address] [place], [place] [address]      Assessment and Plan  Essential hypertension Assessment & Plan: Her blood pressure was elevated in the office today.  Per previous recommendations of PCP encouraged her to take her blood pressure medication lisinopril 5 mg p.o. daily in addition to her Cardizem 180 mg daily   Left arm pain Assessment & Plan: Acute, I agree with [name] that left arm pain is most likely secondary to cervical spine in origin, less likely secondary to shoulder pathology. Given she had minimal response to low-dose prednisone I have placed her on an increased dose of prednisone.  She does have a focal area of tenderness separate from the majority of her burning pain .she localizes this is this pain in her antecubital fossa.  This area seems more consistent with possible superficial phlebitis that has been intermittent in the past.  There is no mass, no sign of infection.  There is no distal arm swelling suggesting DVT.   Mild intermittent extrinsic asthma without complication Assessment & Plan: Chronic, she notes today that she feels her Flovent inhaler is causing her to cough more and produce more mucus.  But it is unclear if she is trying to say that this is a different inhaler than her usual that is causing problems.  She does also states she has been on Flovent for a long time.  She states that when she  does not use it she feels better.  She states overall her breathing is doing very well and she is no not short of breath.  On lung exam though there are scattered wheezes.  This would likely improve during the prednisone course.  She is a difficult historian to follow and given I do not know her asthma history, I will defer to her PCP for further recommendations on her asthma controller inhaler.  She states that she  has stopped Flovent.    Cervical radiculopathy Assessment & Plan: Acute.  Reviewed cervical spine x-rays.  Treat with prednisone 20 mg 6-day taper, 60/40/20 Patient will call if her  symptoms are not significantly improved after the first 2 to 3 days of the high-dose taper. She does have orthopedic referral already in process although no appointment date has been set.   Other orders -     predniSONE; 3 tabs by mouth daily x 3 days, then 2 tabs by mouth daily x 2 days then 1 tab by mouth daily x 2 days  Dispense: 15 tablet; Refill: 0    No follow-ups on file.   Eliezer Lofts, MD

## 2022-05-01 ENCOUNTER — Telehealth: Payer: Self-pay | Admitting: Family Medicine

## 2022-05-01 NOTE — Telephone Encounter (Signed)
Please get update on patient about her breathing and also her arm pain with most recent/higher dose prednisone taper.  My understanding is that she was going to hold Flovent.  Please let me know how she is doing.  Thanks.

## 2022-05-03 ENCOUNTER — Other Ambulatory Visit: Payer: Self-pay | Admitting: Family Medicine

## 2022-05-03 NOTE — Telephone Encounter (Signed)
LMTCB

## 2022-05-03 NOTE — Telephone Encounter (Signed)
Spoke with patient and she is feeling much better; breathing is good. Patient has not used the flovent in 3 weeks. She is on prednisone 2 tabs x 3 days taper currently. Arm pain is better; off and on it will tingle but is fine.

## 2022-05-03 NOTE — Telephone Encounter (Signed)
Noted.  If she has return of sx at the end of the taper (respiratory or arm or o/w), then let me know.  Thanks.

## 2022-05-05 DIAGNOSIS — H353211 Exudative age-related macular degeneration, right eye, with active choroidal neovascularization: Secondary | ICD-10-CM | POA: Diagnosis not present

## 2022-05-13 ENCOUNTER — Ambulatory Visit: Payer: Self-pay

## 2022-05-13 NOTE — Patient Outreach (Signed)
  Care Coordination   05/13/2022 Name: SONDRA BLUMER MRN: [id number] DOB: 11-28-1935   Care Coordination Outreach Attempts:  An unsuccessful telephone outreach was attempted for a scheduled appointment today.  HIPAA compliant message left with call back phone number.   Follow Up Plan:  Additional outreach attempts will be made to offer the patient care coordination information and services.   Encounter Outcome:  No Answer   Care Coordination Interventions:  No, not indicated    Quinn Plowman [facility] [place] [phone] direct line

## 2022-05-17 ENCOUNTER — Encounter: Payer: Medicare HMO | Admitting: Pharmacist

## 2022-05-27 DIAGNOSIS — R1084 Generalized abdominal pain: Secondary | ICD-10-CM | POA: Diagnosis not present

## 2022-05-27 DIAGNOSIS — I10 Essential (primary) hypertension: Secondary | ICD-10-CM | POA: Diagnosis not present

## 2022-05-28 ENCOUNTER — Other Ambulatory Visit: Payer: Self-pay

## 2022-05-28 ENCOUNTER — Encounter (HOSPITAL_COMMUNITY): Payer: Self-pay

## 2022-05-28 ENCOUNTER — Emergency Department: Payer: 59

## 2022-05-28 ENCOUNTER — Emergency Department
Admission: EM | Admit: 2022-05-28 | Discharge: 2022-05-28 | Disposition: A | Discharge status: Home / Self Care | Payer: 59 | Attending: Emergency Medicine | Admitting: Emergency Medicine

## 2022-05-28 ENCOUNTER — Emergency Department (HOSPITAL_COMMUNITY)
Admission: EM | Admit: 2022-05-28 | Discharge: 2022-05-28 | Disposition: A | Discharge status: Home / Self Care | Payer: 59 | Attending: Emergency Medicine | Admitting: Emergency Medicine

## 2022-05-28 DIAGNOSIS — I700 Atherosclerosis of aorta: Secondary | ICD-10-CM | POA: Diagnosis not present

## 2022-05-28 DIAGNOSIS — R102 Pelvic and perineal pain: Secondary | ICD-10-CM

## 2022-05-28 DIAGNOSIS — R1030 Lower abdominal pain, unspecified: Secondary | ICD-10-CM | POA: Insufficient documentation

## 2022-05-28 DIAGNOSIS — I10 Essential (primary) hypertension: Secondary | ICD-10-CM | POA: Diagnosis not present

## 2022-05-28 DIAGNOSIS — R109 Unspecified abdominal pain: Secondary | ICD-10-CM | POA: Diagnosis not present

## 2022-05-28 DIAGNOSIS — Z7901 Long term (current) use of anticoagulants: Secondary | ICD-10-CM | POA: Insufficient documentation

## 2022-05-28 DIAGNOSIS — R1084 Generalized abdominal pain: Secondary | ICD-10-CM | POA: Insufficient documentation

## 2022-05-28 LAB — CBC WITH DIFFERENTIAL/PLATELET
Abs Immature Granulocytes: 0.03 10*3/uL (ref 0.00–0.07)
Abs Immature Granulocytes: 0.03 10*3/uL (ref 0.00–0.07)
Basophils Absolute: 0 10*3/uL (ref 0.0–0.1)
Basophils Absolute: 0 10*3/uL (ref 0.0–0.1)
Basophils Relative: 0 %
Basophils Relative: 0 %
Eosinophils Absolute: 0.1 10*3/uL (ref 0.0–0.5)
Eosinophils Absolute: 0.1 10*3/uL (ref 0.0–0.5)
Eosinophils Relative: 1 %
Eosinophils Relative: 1 %
HCT: 38.8 % (ref 36.0–46.0)
HCT: 38.8 % (ref 36.0–46.0)
Hemoglobin: 12.4 g/dL (ref 12.0–15.0)
Hemoglobin: 12.5 g/dL (ref 12.0–15.0)
Immature Granulocytes: 0 %
Immature Granulocytes: 0 %
Lymphocytes Relative: 33 %
Lymphocytes Relative: 29 %
Lymphs Abs: 2.3 10*3/uL (ref 0.7–4.0)
Lymphs Abs: 2 10*3/uL (ref 0.7–4.0)
MCH: 29 pg (ref 26.0–34.0)
MCH: 28.4 pg (ref 26.0–34.0)
MCHC: 32 g/dL (ref 30.0–36.0)
MCHC: 32.2 g/dL (ref 30.0–36.0)
MCV: 90.9 fL (ref 80.0–100.0)
MCV: 88.2 fL (ref 80.0–100.0)
Monocytes Absolute: 0.5 10*3/uL (ref 0.1–1.0)
Monocytes Absolute: 0.6 10*3/uL (ref 0.1–1.0)
Monocytes Relative: 7 %
Monocytes Relative: 8 %
Neutro Abs: 4.2 10*3/uL (ref 1.7–7.7)
Neutro Abs: 4.1 10*3/uL (ref 1.7–7.7)
Neutrophils Relative %: 59 %
Neutrophils Relative %: 62 %
Platelets: 245 10*3/uL (ref 150–400)
Platelets: 249 10*3/uL (ref 150–400)
RBC: 4.27 MIL/uL (ref 3.87–5.11)
RBC: 4.4 MIL/uL (ref 3.87–5.11)
RDW: 14.6 % (ref 11.5–15.5)
RDW: 14.5 % (ref 11.5–15.5)
WBC: 7.2 10*3/uL (ref 4.0–10.5)
WBC: 6.8 10*3/uL (ref 4.0–10.5)
nRBC: 0 % (ref 0.0–0.2)
nRBC: 0 % (ref 0.0–0.2)

## 2022-05-28 LAB — COMPREHENSIVE METABOLIC PANEL
ALT: 16 U/L (ref 0–44)
ALT: 19 U/L (ref 0–44)
AST: 31 U/L (ref 15–41)
AST: 28 U/L (ref 15–41)
Albumin: 3.4 g/dL — ABNORMAL LOW (ref 3.5–5.0)
Albumin: 3.4 g/dL — ABNORMAL LOW (ref 3.5–5.0)
Alkaline Phosphatase: 92 U/L (ref 38–126)
Alkaline Phosphatase: 95 U/L (ref 38–126)
Anion gap: 7 (ref 5–15)
Anion gap: 8 (ref 5–15)
BUN: 21 mg/dL (ref 8–23)
BUN: 11 mg/dL (ref 8–23)
CO2: 30 mmol/L (ref 22–32)
CO2: 29 mmol/L (ref 22–32)
Calcium: 8.8 mg/dL — ABNORMAL LOW (ref 8.9–10.3)
Calcium: 8.8 mg/dL — ABNORMAL LOW (ref 8.9–10.3)
Chloride: 102 mmol/L (ref 98–111)
Chloride: 100 mmol/L (ref 98–111)
Creatinine, Ser: 0.91 mg/dL (ref 0.44–1.00)
Creatinine, Ser: 0.81 mg/dL (ref 0.44–1.00)
GFR, Estimated: >60 mL/min (ref >60)
GFR, Estimated: >60 mL/min (ref >60)
Glucose, Bld: 120 mg/dL — ABNORMAL HIGH (ref 70–99)
Glucose, Bld: 88 mg/dL (ref 70–99)
Potassium: 4.1 mmol/L (ref 3.5–5.1)
Potassium: 4.3 mmol/L (ref 3.5–5.1)
Sodium: 139 mmol/L (ref 135–145)
Sodium: 137 mmol/L (ref 135–145)
Total Bilirubin: 0.5 mg/dL (ref 0.3–1.2)
Total Bilirubin: 0.9 mg/dL (ref 0.3–1.2)
Total Protein: 7.1 g/dL (ref 6.5–8.1)
Total Protein: 6.9 g/dL (ref 6.5–8.1)

## 2022-05-28 LAB — URINALYSIS, ROUTINE W REFLEX MICROSCOPIC
Bilirubin Urine: NEGATIVE
Glucose, UA: NEGATIVE mg/dL
Hgb urine dipstick: NEGATIVE
Ketones, ur: NEGATIVE mg/dL
Leukocytes,Ua: NEGATIVE
Nitrite: NEGATIVE
Protein, ur: NEGATIVE mg/dL
Specific Gravity, Urine: 1.008 (ref 1.005–1.030)
pH: 7 (ref 5.0–8.0)

## 2022-05-28 LAB — URINALYSIS, W/ REFLEX TO CULTURE (INFECTION SUSPECTED)
Bacteria, UA: NONE SEEN
Bilirubin Urine: NEGATIVE
Glucose, UA: NEGATIVE mg/dL
Hgb urine dipstick: NEGATIVE
Ketones, ur: NEGATIVE mg/dL
Nitrite: NEGATIVE
Protein, ur: NEGATIVE mg/dL
Specific Gravity, Urine: 1.023 (ref 1.005–1.030)
pH: 5 (ref 5.0–8.0)

## 2022-05-28 LAB — LACTIC ACID, PLASMA: Lactic Acid, Venous: 1.4 mmol/L (ref 0.5–1.9)

## 2022-05-28 LAB — LIPASE, BLOOD: Lipase: 37 U/L (ref 11–51)

## 2022-05-28 LAB — TROPONIN I (HIGH SENSITIVITY): Troponin I (High Sensitivity): 10 ng/L (ref <18)

## 2022-05-28 MED ORDER — GABAPENTIN 100 MG PO CAPS: 100.0000 mg | ORAL_CAPSULE | Freq: Three times a day (TID) | ORAL | 0 refills | Status: DC

## 2022-05-28 MED ORDER — FOSFOMYCIN TROMETHAMINE 3 G PO PACK
3.0000 g | PACK | Freq: Once | ORAL | Status: AC
  Administered 2022-05-28: 3 g via ORAL
  Filled 2022-05-28: qty 3

## 2022-05-28 MED ORDER — IOHEXOL 300 MG/ML  SOLN
100.0000 mL | Freq: Once | INTRAMUSCULAR | Status: AC | PRN
  Administered 2022-05-28: 100 mL via INTRAVENOUS

## 2022-05-28 MED ORDER — ACETAMINOPHEN 325 MG PO TABS
650.0000 mg | ORAL_TABLET | Freq: Once | ORAL | Status: AC
  Administered 2022-05-28: 650 mg via ORAL
  Filled 2022-05-28: qty 2

## 2022-05-28 NOTE — Discharge Instructions (Addendum)
As discussed, your evaluation today has been largely reassuring.  But, it is important that you monitor your condition carefully, and do not hesitate to return to the ED if you develop new, or concerning changes in your condition.  Otherwise, please follow-up with your physician for appropriate ongoing care.  You are starting a new pain medication for your pain.  Please discuss this with your physician.

## 2022-05-28 NOTE — ED Notes (Signed)
AVS reviewed with pt prior to discharge. Pt verbalizes understanding. Belongings with pt upon depart. Pt taken to POV by wheelchair with family driving home.

## 2022-05-28 NOTE — ED Triage Notes (Signed)
Pt reports abd pain, left leg pain and back pain for the past 2 weeks. Pt seen at [facility] last night and states "they couldn't find anything wrong with me".

## 2022-05-28 NOTE — ED Triage Notes (Signed)
Patient C/O abdominal pain that began last week and has worsened. She also states that she is having difficulty urinating and when she has to go, its only a few drops.

## 2022-05-28 NOTE — ED Provider Notes (Signed)
[place] Provider Note   CSN: [id number] Arrival date & time: 05/28/22  1320     History  Chief Complaint  Patient presents with   Abdominal Pain   Leg Pain    [name] is a 87 y.o. female.  HPI Elderly female presents with her niece who assists with the history.  Patient notes that she has had abdominal pain for years going back to her prior procedure.  Over the past days she has had increasing abdominal pain diffusely with some difficulty with initiating urinary stream, but no true dysuria, no fevers, no chills, no nausea, no vomiting.  She states that she has seen her physician over the years, without clear diagnosis for her ongoing pain.  Yesterday she went to our affiliated facility, had evaluation, and today with ongoing difficulty, pain she presents for evaluation.    Home Medications Prior to Admission medications   Medication Sig Start Date End Date Taking? Authorizing Provider  gabapentin (NEURONTIN) 100 MG capsule Take 1 capsule (100 mg total) by mouth 3 (three) times daily for 7 days. 05/28/22 06/04/22 Yes Carmin Muskrat, MD  ALPRAZolam Duanne Moron) 0.5 MG tablet TAKE ONE TABLET BY MOUTH twice daily AS NEEDED FOR ANXIETY 02/25/22   [name], MD  atorvastatin (LIPITOR) 80 MG tablet TAKE ONE TABLET BY MOUTH EVERY EVENING 07/05/21   [name], MD  Cholecalciferol (VITAMIN D) 2000 units CAPS Take 2,000 Units by mouth daily.    [provider]  diltiazem (CARDIZEM CD) 180 MG 24 hr capsule TAKE ONE CAPSULE BY MOUTH ONCE DAILY 05/03/22   [name], MD  ELIQUIS 5 MG TABS tablet TAKE ONE TABLET BY MOUTH TWICE DAILY 12/30/21   [name], MD  fluticasone [facility]) 50 MCG/ACT nasal spray USE TWO SPRAYS in each nostril ONCE DAILY 01/26/22   [name], MD  fluticasone (FLOVENT HFA) 110 MCG/ACT inhaler Inhale 2 puffs into the lungs in the morning and at bedtime. Rinse after use 01/26/22   [name], MD  hydrocortisone (ANUSOL-HC) 25 MG suppository Place 1 suppository (25 mg total) rectally 2 (two) times daily as needed for hemorrhoids or anal itching. 03/05/21   [name], MD  lansoprazole (PREVACID) 30 MG capsule TAKE ONE CAPSULE BY MOUTH TWICE DAILY 10/29/21   [name], MD  lisinopril (ZESTRIL) 5 MG tablet Stopped as of 11/11/21 11/11/21   [name], MD  neomycin-polymyxin b-dexamethasone (MAXITROL) 3.5-10000-0.1 OINT Place 1 Application into the right eye 2 (two) times daily. 10/25/21   [provider]  predniSONE (DELTASONE) 20 MG tablet 3 tabs by mouth daily x 3 days, then 2 tabs by mouth daily x 2 days then 1 tab by mouth daily x 2 days 04/26/22   Jinny Sanders, MD  sertraline (ZOLOFT) 100 MG tablet TAKE 1 AND 1/2 TABLETS BY MOUTH ONCE DAILY 05/03/22   [name], MD  triamcinolone cream (KENALOG) 0.1 % apply one application TOPICALLY twice daily 07/05/21   [name], MD  vitamin B-12 (CYANOCOBALAMIN) 1000 MCG tablet Take 1,000 mcg by mouth daily.    [provider]      Allergies    Amoxicillin, Penicillins, Valium [diazepam], and Imdur [isosorbide nitrate]    Review of Systems   Review of Systems  All other systems reviewed and are negative.   Physical Exam Updated Vital Signs BP (!) 153/70   Pulse (!) 58  Temp 98 F (36.7 C) (Oral)   Resp 19   SpO2 94%  Physical Exam Vitals and nursing note reviewed.  Constitutional:      General: She is not in acute distress.    Appearance: She is well-developed.  HENT:     Head: Normocephalic and atraumatic.  Eyes:     Conjunctiva/sclera: Conjunctivae normal.  Cardiovascular:     Rate and Rhythm: Normal rate and regular rhythm.  Pulmonary:     Effort: Pulmonary effort is normal. No respiratory distress.     Breath sounds: Normal breath sounds. No stridor.  Abdominal:     General: There is no distension.     Tenderness: There is generalized abdominal tenderness. There  is no guarding or rebound.  Skin:    General: Skin is warm and dry.  Neurological:     Mental Status: She is alert and oriented to person, place, and time.     Cranial Nerves: No cranial nerve deficit.  Psychiatric:        Mood and Affect: Mood normal.     ED Results / Procedures / Treatments   Labs (all labs ordered are listed, but only abnormal results are displayed) Labs Reviewed  COMPREHENSIVE METABOLIC PANEL - Abnormal; Notable for the following components:      Result Value   Calcium 8.8 (*)    Albumin 3.4 (*)    All other components within normal limits  URINALYSIS, ROUTINE W REFLEX MICROSCOPIC - Abnormal; Notable for the following components:   Color, Urine STRAW (*)    All other components within normal limits  CBC WITH DIFFERENTIAL/PLATELET    EKG None  Radiology CT ABDOMEN PELVIS W CONTRAST  Result Date: 05/28/2022 CLINICAL DATA:  Abdominal pain, acute, nonlocalized EXAM: CT ABDOMEN AND PELVIS WITH CONTRAST TECHNIQUE: Multidetector CT imaging of the abdomen and pelvis was performed using the standard protocol following bolus administration of intravenous contrast. RADIATION DOSE REDUCTION: This exam was performed according to the departmental dose-optimization program which includes automated exposure control, adjustment of the mA and/or kV according to patient size and/or use of iterative reconstruction technique. CONTRAST:  122mL OMNIPAQUE IOHEXOL 300 MG/ML  SOLN COMPARISON:  01/01/2022 FINDINGS: Lower chest: No acute abnormality Hepatobiliary: Layering gallstones within the gallbladder. No suspicious focal hepatic abnormality or biliary ductal dilatation. Scattered calcifications. Pancreas: No focal abnormality or ductal dilatation. Spleen: Scattered calcifications.  Normal size. Adrenals/Urinary Tract: No adrenal abnormality. No focal renal abnormality. No stones or hydronephrosis. Urinary bladder is unremarkable. Stomach/Bowel: Sigmoid diverticulosis. No active  diverticulitis. Stomach and small bowel decompressed, unremarkable. Vascular/Lymphatic: Diffuse aortic atherosclerosis. No evidence of aneurysm or adenopathy. Reproductive: No suspicious abnormality Other: No free fluid or free air. Musculoskeletal: No acute bony abnormality. IMPRESSION: No acute findings in the abdomen or pelvis. Old granulomatous disease in the liver and spleen. Cholelithiasis. Sigmoid diverticulosis. Aortic atherosclerosis. Electronically Signed   By: Rolm Baptise M.D.   On: 05/28/2022 02:36    Procedures Procedures    Medications Ordered in ED Medications - No data to display  ED Course/ Medical Decision Making/ A&P                             Medical Decision Making Elderly female with multiple medical issues including A-fib, on anticoagulation, cecal polyps, diabetes, hypertension and abdominal pain for years presents with worsening pain and difficulty initiating urinary stream.  He is awake, alert, speaking clearly has a nonperitoneal abdomen, and I  reviewed yesterday's chart including CT results, labs. Given ongoing urinary changes, some suspicion for postvoiding retention versus obstruction, ultrasound ordered, labs urinalysis ordered for comparison to yesterday's evaluation as well.  Amount and/or Complexity of Data Reviewed Independent Historian: caregiver External Data Reviewed: notes. Labs: ordered. Decision-making details documented in ED Course. Radiology: ordered and independent interpretation performed. Decision-making details documented in ED Course.  Risk Prescription drug management. Decision regarding hospitalization.   6:41 PM Patient awake, alert, in no distress, hemodynamically unremarkable, no ongoing complaints.  I reviewed her labs, CT, compared them to yesterday's findings no notable abnormalities, urinalysis without evidence for infection.  No evidence for bacteremia, sepsis, obstruction.  Given her description of some difficulty with  initiating urinary stream, etiology such as cystocele is a consideration and the patient is appropriate follow-up as an outpatient with gynecology, possibly gynecology urology for additional next evaluation.        Final Clinical Impression(s) / ED Diagnoses Final diagnoses:  Lower abdominal pain    Rx / DC Orders ED Discharge Orders          Ordered    gabapentin (NEURONTIN) 100 MG capsule  3 times daily        05/28/22 1841              Carmin Muskrat, MD 05/28/22 1842

## 2022-05-28 NOTE — ED Provider Notes (Signed)
[facility] Provider Note    Event Date/Time   First MD Initiated Contact with Patient 05/28/22 [phone]     (approximate)   History   Abdominal Pain   HPI  [name] is a 87 y.o. female who presents for evaluation of lower abdominal pain in the suprapubic region.  She says that is been present for about a week but it seems to be worse tonight.  She has had no nausea, vomiting, diarrhea, nor constipation.  Her last bowel movement was earlier today and it was normal.  She said that it has been harder for her to urinate recently but it does not burn when she urinates.  She has had no fever, chest pain, nor shortness of breath.  Nothing in particular seems to make the pain better or worse.  She said that she has been lifting things and getting more exercise than usual and is not sure whether she could have pulled some muscles.     Physical Exam   Triage Vital Signs: ED Triage Vitals  Enc Vitals Group     BP 05/28/22 0050 (!) 144/68     Pulse Rate 05/28/22 0050 60     Resp 05/28/22 0050 18     Temp 05/28/22 0050 97.9 F (36.6 C)     Temp Source 05/28/22 0050 Oral     SpO2 05/28/22 0050 95 %     Weight 05/28/22 0051 90 kg (198 lb 6.6 oz)     Height --      Head Circumference --      Peak Flow --      Pain Score 05/28/22 0050 10     Pain Loc --      Pain Edu? --      Excl. in [place]? --     Most recent vital signs: Vitals:   05/28/22 0400 05/28/22 0401  BP: 126/77   Pulse: 65 61  Resp: 14 (!) 22  Temp:    SpO2: 91% 94%     General: Awake, no distress.  CV:  Good peripheral perfusion.  Normal heart sounds.  Regular rate and rhythm. Resp:  Normal effort. Speaking easily and comfortably, no accessory muscle usage nor intercostal retractions.  Lungs are clear to auscultation. Abd:  No distention.  No tenderness to palpation throughout the abdomen including the suprapubic region.  She has no guarding and no rebound.  She continues to talk to me  throughout the abdominal exam.   ED Results / Procedures / Treatments   Labs (all labs ordered are listed, but only abnormal results are displayed) Labs Reviewed  COMPREHENSIVE METABOLIC PANEL - Abnormal; Notable for the following components:      Result Value   Glucose, Bld 120 (*)    Calcium 8.8 (*)    Albumin 3.4 (*)    All other components within normal limits  URINALYSIS, W/ REFLEX TO CULTURE (INFECTION SUSPECTED) - Abnormal; Notable for the following components:   Color, Urine YELLOW (*)    APPearance CLEAR (*)    Leukocytes,Ua SMALL (*)    All other components within normal limits  LACTIC ACID, PLASMA  CBC WITH DIFFERENTIAL/PLATELET  LIPASE, BLOOD  TROPONIN I (HIGH SENSITIVITY)     EKG  ED ECG REPORT I, Hinda Kehr, the attending physician, personally viewed and interpreted this ECG.  Date: 05/28/2022 EKG Time: 1:32 AM Rate: 60 Rhythm: normal sinus rhythm QRS Axis: normal Intervals: normal ST/T Wave abnormalities: Non-specific ST segment /  T-wave changes, but no clear evidence of acute ischemia. Narrative Interpretation: no definitive evidence of acute ischemia; does not meet STEMI criteria.    RADIOLOGY I viewed and interpreted the patient's CT scan of the abdomen and pelvis.  She has no acute findings such as SBO or obvious infection.  Radiology report confirms no acute abnormalities in the abdomen and pelvis.    PROCEDURES:  Critical Care performed: No  Procedures   MEDICATIONS ORDERED IN ED: Medications  iohexol (OMNIPAQUE) 300 MG/ML solution 100 mL (100 mLs Intravenous Contrast Given 05/28/22 0222)  fosfomycin (MONUROL) packet 3 g (3 g Oral Given 05/28/22 0350)  acetaminophen (TYLENOL) tablet 650 mg (650 mg Oral Given 05/28/22 0350)     IMPRESSION / MDM / ASSESSMENT AND PLAN / ED COURSE  I reviewed the triage vital signs and the nursing notes.                              Differential diagnosis includes, but is not limited to, UTI,  pyelonephritis, ureteral stone, SBO/ileus, diverticulitis, appendicitis, mesenteric ischemia.  Patient's presentation is most consistent with acute presentation with potential threat to life or bodily function.  Labs/studies ordered: Lactic acid, lipase, CBC with differential, urinalysis, high-sensitivity troponin, comprehensive metabolic panel, EKG, CT abdomen/pelvis with IV contrast Interventions/Medications given: In and out urinary catheterization, acetaminophen 650 mg p.o., fosfomycin 3 g p.o. [facility] Course my include additional interventions or labs/studies not listed above.)  Vital signs are stable and within normal limits.  She has no respiratory or thoracic symptoms.  EKG is reassuring.  Initially she reported suprapubic pain, but she had no tenderness to palpation at all during physical exam.  Her laboratory assessment was quite reassuring as well with no acute abnormalities identified.  Even her urinalysis was almost normal with just small leukocytes.  I treated the very slight possibility that she has UTI with 1 dose of fosfomycin 3 g p.o., but that should be appropriate and adequate.  I ordered the new order, the "urinalysis with reflex to culture".  Lactic acid is within normal limits and the patient has no evidence of an acute or emergent condition.  Given her age and the report of abdominal pain, I ordered a CT of the abdomen and pelvis and it was also reassuring with no abnormalities, as documented above.  I provided reassurance and told her that in fact she might be right, she could have just pulled some muscles.  She had not tried anything at home.  I ordered acetaminophen 650 mg p.o. and encouraged her to follow-up with her regular doctor at the next available opportunity.  She said that she understood.  I gave my usual and customary return precautions.  The patient was on the cardiac monitor to evaluate for evidence of arrhythmia and/or significant heart rate changes.        FINAL CLINICAL IMPRESSION(S) / ED DIAGNOSES   Final diagnoses:  Suprapubic abdominal pain     Rx / DC Orders   ED Discharge Orders     None        Note:  This document was prepared using Dragon voice recognition software and may include unintentional dictation errors.   Hinda Kehr, MD 05/28/22 [phone]

## 2022-05-28 NOTE — Discharge Instructions (Signed)

## 2022-05-28 NOTE — ED Notes (Signed)
Patient transported to CT 

## 2022-05-30 ENCOUNTER — Telehealth: Payer: Self-pay

## 2022-05-30 NOTE — Transitions of Care (Post Inpatient/ED Visit) (Signed)
   05/30/2022  Name: [name] MRN: [id number] DOB: 11/26/1935 Patient was taken to [facility] by EMS seen and treated. Did not feel any better was seen same day at [facility] ED. She has not made made appointment with urology at this time. I have scheduled follow up with PCP on 06/02/22.  Today's TOC FU Call Status: Today's TOC FU Call Status:: Successful TOC FU Call Competed TOC FU Call Complete Date: 05/30/22  Transition Care Management Follow-up Telephone Call Date of Discharge: 05/28/22 Discharge Facility: [facility] (and [facility]) Type of Discharge: Emergency Department Reason for ED Visit: Other: (Abdominal pain) How have you been since you were released from the hospital?: Worse Any questions or concerns?: No  Items Reviewed: Did you receive and understand the discharge instructions provided?: Yes Medications obtained and verified?: Yes (Medications Reviewed) (Patient has not started Gabapentin yet.) Any new allergies since your discharge?: No Dietary orders reviewed?: NA Do you have support at home?: No People in Home: grandchild(ren) Name of Support/Comfort Primary Source: [facility] and Equipment/Supplies: [place] Ordered?: NA Any new equipment or medical supplies ordered?: NA  Functional Questionnaire: Do you need assistance with bathing/showering or dressing?: No Do you need assistance with meal preparation?: No Do you need assistance with eating?: No Do you have difficulty maintaining continence: No Do you need assistance with getting out of bed/getting out of a chair/moving?: No Do you have difficulty managing or taking your medications?: No  Follow up appointments reviewed: PCP Follow-up appointment confirmed?: Yes Date of PCP follow-up appointment?: 06/02/22 Follow-up Provider: [facility] Follow-up appointment confirmed?: No Follow-Up Specialty Provider:: has referral to urology Reason Specialist  Follow-Up Not Confirmed: Patient has Specialist Provider Number and will Call for Appointment Do you need transportation to your follow-up appointment?: No    SIGNATURE Francella Solian, [place]

## 2022-05-30 NOTE — Telephone Encounter (Signed)
Noted. Thanks.

## 2022-05-31 ENCOUNTER — Ambulatory Visit: Payer: 59 | Attending: Family Medicine

## 2022-05-31 ENCOUNTER — Ambulatory Visit: Payer: Self-pay | Admitting: *Deleted

## 2022-05-31 ENCOUNTER — Other Ambulatory Visit: Payer: Self-pay | Admitting: Family Medicine

## 2022-05-31 DIAGNOSIS — R0989 Other specified symptoms and signs involving the circulatory and respiratory systems: Secondary | ICD-10-CM | POA: Diagnosis not present

## 2022-05-31 NOTE — Telephone Encounter (Signed)
Refill request for alprazolam 0.5 mg tablet   LOV - 04/26/22 Next OV - 06/02/22 Last refill - 02/25/22 #60/1

## 2022-05-31 NOTE — Chronic Care Management (AMB) (Signed)
   05/31/2022  [name] 09-20-1935 [id number]   Enrollment status changed to previously enrolled.  Jacqlyn Larsen [facility], BSN RN Case Manager [phone]

## 2022-06-02 ENCOUNTER — Ambulatory Visit (INDEPENDENT_AMBULATORY_CARE_PROVIDER_SITE_OTHER): Payer: 59 | Admitting: Family Medicine

## 2022-06-02 ENCOUNTER — Encounter: Payer: Self-pay | Admitting: Family Medicine

## 2022-06-02 VITALS — BP 130/60 | HR 63 | Temp 97.5°F | Ht 66.0 in | Wt 195.0 lb

## 2022-06-02 DIAGNOSIS — M545 Low back pain, unspecified: Secondary | ICD-10-CM

## 2022-06-02 DIAGNOSIS — R0989 Other specified symptoms and signs involving the circulatory and respiratory systems: Secondary | ICD-10-CM | POA: Diagnosis not present

## 2022-06-02 LAB — VAS US LOWER EXT ART SEG MULTI (SEGMENTALS & LE RAYNAUDS)
Left ABI: 0.76
Right ABI: 0.6

## 2022-06-02 MED ORDER — GABAPENTIN 100 MG PO CAPS: 100.0000 mg | ORAL_CAPSULE | Freq: Three times a day (TID) | ORAL | 3 refills | Status: DC

## 2022-06-02 MED ORDER — LISINOPRIL 5 MG PO TABS: ORAL_TABLET | ORAL | 1 refills | Status: DC

## 2022-06-02 NOTE — Progress Notes (Signed)
ER f/u.  Had lower abd pain that improved some with gabapentin.  R>L lower back pain, radiates around to the suprapubic area.  Lower back sore with back flexion.  Prev CT with multilevel degenerative changes spine. Pain is better sitting.  No burning with urination.    Prev CT d/w pt.  IMPRESSION: No acute findings in the abdomen or pelvis. Old granulomatous disease in the liver and spleen. Cholelithiasis. Sigmoid diverticulosis. Aortic atherosclerosis.  Discussed inhaler use.  She felt better off flovent.  More cough with use.  Some occ sputum in the meantime.    Has only used lisinopril if SBP >140, 5mg  per day. Not used frequently.    ============================  H/o dec pulse in B feet.   Arterial study d/w pt.   Summary: Right: Resting right ankle-brachial index indicates moderate right lower extremity arterial disease. The right toe-brachial index is abnormal.  Left: Resting left ankle-brachial index indicates moderate left lower extremity arterial disease. The left toe-brachial index is abnormal.  D/w pt that I need to get input from [employer]. She has some leg cramping with walking but not always.  She some occasional but not consistent cramping at rest.   ===================  Meds, vitals, and allergies reviewed.   ROS: Per HPI unless specifically indicated in ROS section   [place]ad [place] supple no LA Rrr, SEM noted.  Ctab Abd soft, not ttp No BLE edema.  Normal sensation in the B legs. Able to bear weight.    30 minutes were devoted to patient care in this encounter (this includes time spent reviewing the patient's file/history, interviewing and examining the patient, counseling/reviewing plan with patient).

## 2022-06-02 NOTE — Patient Instructions (Signed)
Let me update cardiology about your legs.  Keep taking gabapentin in the meantime.  I think the pain is possibly coming from your back but I want to talk to the heart docs first.  Take care.  Glad to see you.

## 2022-06-03 NOTE — Assessment & Plan Note (Addendum)
She has lower back pain that is radiating around to the abdomen.  My concern is that her abdominal discomfort may be coming from her lower back.  Her symptoms are worse when she is standing and I question if she has lumbar stenosis.  Pathophysiology of lumbar stenosis discussed with patient.  She has known multilevel degenerative changes in the spine, from previous imaging.  Would continue gabapentin in the meantime.  I think it makes sense to address her decreased dorsalis pedis pulse in the meantime before intervening on her back.  Discussed.

## 2022-06-03 NOTE — Assessment & Plan Note (Signed)
Arterial study d/w pt.   Summary: Right: Resting right ankle-brachial index indicates moderate right lower extremity arterial disease. The right toe-brachial index is abnormal.  Left: Resting left ankle-brachial index indicates moderate left lower extremity arterial disease. The left toe-brachial index is abnormal.  D/w pt that I need to get input from [employer]. She has some leg cramping with walking but not always.  She some occasional but not consistent cramping at rest.

## 2022-06-06 ENCOUNTER — Telehealth: Payer: Self-pay | Admitting: Family Medicine

## 2022-06-06 NOTE — Telephone Encounter (Signed)
Patient would like a phone call regarding her b/p. She took it and it was running 172/57,and 178/59. She requested to take to Dr Carole Civil nurse. After taking her b/p pill she said it finally went down,but it took a while and she doesn't understand why.

## 2022-06-06 NOTE — Telephone Encounter (Signed)
LMTCB

## 2022-06-07 NOTE — Telephone Encounter (Signed)
Patient called back and stated her BP was up again this am and she went ahead and took her BP medicine but has not taken her BP again since.

## 2022-06-14 ENCOUNTER — Telehealth: Payer: Self-pay | Admitting: Family Medicine

## 2022-06-14 NOTE — Telephone Encounter (Signed)
Patient called in and would like to know if Dr Damita Dunnings ever spoke with her cardiologist since her last visit.She called in stating that she is getting worse.Her back and legs are really hurting her.

## 2022-06-15 NOTE — Telephone Encounter (Signed)
We sent the information to [facility] card.  Please call cardiology and ask them to call patient about eval.  Thanks.

## 2022-06-17 NOTE — Telephone Encounter (Signed)
Called and spoke to Amy at [facility] and was advised that all the patient needs to do is call their office and schedule an appointment with them. Amy stated that the patient saw them in January and she is already an established patient there and would not need a referral.  Left a message for patient to call the office back. Patient can call [phone] and schedule an appointment.

## 2022-06-20 NOTE — Telephone Encounter (Signed)
Patient called in returning a call she received. Relayed message below and gave patient number to call and schedule.

## 2022-06-29 ENCOUNTER — Other Ambulatory Visit: Payer: Self-pay | Admitting: Family Medicine

## 2022-07-04 DIAGNOSIS — H353122 Nonexudative age-related macular degeneration, left eye, intermediate dry stage: Secondary | ICD-10-CM | POA: Diagnosis not present

## 2022-07-04 DIAGNOSIS — H35031 Hypertensive retinopathy, right eye: Secondary | ICD-10-CM | POA: Diagnosis not present

## 2022-07-04 DIAGNOSIS — H353211 Exudative age-related macular degeneration, right eye, with active choroidal neovascularization: Secondary | ICD-10-CM | POA: Diagnosis not present

## 2022-07-04 DIAGNOSIS — H43813 Vitreous degeneration, bilateral: Secondary | ICD-10-CM | POA: Diagnosis not present

## 2022-07-07 ENCOUNTER — Other Ambulatory Visit: Payer: Self-pay | Admitting: Nurse Practitioner

## 2022-07-07 DIAGNOSIS — R600 Localized edema: Secondary | ICD-10-CM

## 2022-07-07 DIAGNOSIS — I739 Peripheral vascular disease, unspecified: Secondary | ICD-10-CM | POA: Diagnosis not present

## 2022-07-07 DIAGNOSIS — I2510 Atherosclerotic heart disease of native coronary artery without angina pectoris: Secondary | ICD-10-CM | POA: Diagnosis not present

## 2022-07-07 DIAGNOSIS — I83813 Varicose veins of bilateral lower extremities with pain: Secondary | ICD-10-CM

## 2022-07-07 DIAGNOSIS — I10 Essential (primary) hypertension: Secondary | ICD-10-CM | POA: Diagnosis not present

## 2022-07-07 DIAGNOSIS — I350 Nonrheumatic aortic (valve) stenosis: Secondary | ICD-10-CM | POA: Diagnosis not present

## 2022-07-07 DIAGNOSIS — E782 Mixed hyperlipidemia: Secondary | ICD-10-CM | POA: Diagnosis not present

## 2022-07-07 DIAGNOSIS — M79604 Pain in right leg: Secondary | ICD-10-CM

## 2022-07-07 DIAGNOSIS — I480 Paroxysmal atrial fibrillation: Secondary | ICD-10-CM | POA: Diagnosis not present

## 2022-07-07 DIAGNOSIS — M79605 Pain in left leg: Secondary | ICD-10-CM | POA: Diagnosis not present

## 2022-07-08 ENCOUNTER — Ambulatory Visit
Admission: RE | Admit: 2022-07-08 | Discharge: 2022-07-08 | Disposition: A | Discharge status: Home / Self Care | Payer: 59 | Source: Ambulatory Visit | Attending: Nurse Practitioner | Admitting: Nurse Practitioner

## 2022-07-08 DIAGNOSIS — R600 Localized edema: Secondary | ICD-10-CM | POA: Diagnosis not present

## 2022-07-08 DIAGNOSIS — I83813 Varicose veins of bilateral lower extremities with pain: Secondary | ICD-10-CM | POA: Insufficient documentation

## 2022-07-08 DIAGNOSIS — M79604 Pain in right leg: Secondary | ICD-10-CM | POA: Diagnosis not present

## 2022-07-08 DIAGNOSIS — M79605 Pain in left leg: Secondary | ICD-10-CM | POA: Insufficient documentation

## 2022-07-14 DIAGNOSIS — M545 Low back pain, unspecified: Secondary | ICD-10-CM | POA: Diagnosis not present

## 2022-07-14 DIAGNOSIS — G8929 Other chronic pain: Secondary | ICD-10-CM | POA: Diagnosis not present

## 2022-07-15 ENCOUNTER — Encounter: Payer: Self-pay | Admitting: Family Medicine

## 2022-07-15 ENCOUNTER — Ambulatory Visit (INDEPENDENT_AMBULATORY_CARE_PROVIDER_SITE_OTHER): Payer: 59 | Admitting: Family Medicine

## 2022-07-15 VITALS — BP 118/62 | HR 77 | Temp 97.8°F | Ht 66.0 in | Wt 184.0 lb

## 2022-07-15 DIAGNOSIS — R109 Unspecified abdominal pain: Secondary | ICD-10-CM

## 2022-07-15 DIAGNOSIS — R1030 Lower abdominal pain, unspecified: Secondary | ICD-10-CM

## 2022-07-15 MED ORDER — METRONIDAZOLE 500 MG PO TABS: 500.0000 mg | ORAL_TABLET | Freq: Three times a day (TID) | ORAL | 0 refills | Status: DC

## 2022-07-15 MED ORDER — CIPROFLOXACIN HCL 500 MG PO TABS: 500.0000 mg | ORAL_TABLET | Freq: Two times a day (BID) | ORAL | 0 refills | Status: DC

## 2022-07-15 NOTE — Patient Instructions (Signed)
Possible/likely diverticulitis.   Start cipro and flagyl. Start those today.  Don't take prednisone yet.   If more belly pain, then go to the ER.   Drink clear liquids for now, until the bell pain is better.   I don't think you have polymyalgia rheumatica.   Update me Monday.   Take care.  Glad to see you.

## 2022-07-15 NOTE — Progress Notes (Unsigned)
Started with back pain years ago.  Worse last week, lower midline pain.  No radiation to the legs.  Pain increased with walking.  Using cane at baseline. More pain standing up straight.  Less pain laying down.    Black stools 4 days ago, mult episodes during the day and night.  No black stools since then.  BMs returned to normal in the meantime.  Still on eliquis at baseline.  Now with central abd pain.  Her abdominal pain is clearly worse with eating.    When to UC last night.  Has been taking gabapentin at baseline. Went to UC yesterday.  She brought in papers today asking about possible polymyalgia rheumatica but she doesn't have arm or leg pain now.  She isn't on prednisone.   Her arm pain is with activity (ie washing dishes), not constant.   U/s done 07/08/22.  IMPRESSION: No evidence of deep venous thrombosis in either lower extremity.  Meds, vitals, and allergies reviewed.   ROS: Per HPI unless specifically indicated in ROS section   Lower abd ttp w/o rebound, left right and central.   Lower back ttp.   No edema.

## 2022-07-16 LAB — CBC WITH DIFFERENTIAL/PLATELET
Absolute Monocytes: 483 cells/uL (ref 200–950)
Basophils Absolute: 21 cells/uL (ref 0–200)
Basophils Relative: 0.3 %
Eosinophils Absolute: 21 cells/uL (ref 15–500)
Eosinophils Relative: 0.3 %
HCT: 39.7 % (ref 35.0–45.0)
Hemoglobin: 13.1 g/dL (ref 11.7–15.5)
Lymphs Abs: 1884 cells/uL (ref 850–3900)
MCH: 28.6 pg (ref 27.0–33.0)
MCHC: 33 g/dL (ref 32.0–36.0)
MCV: 86.7 fL (ref 80.0–100.0)
MPV: 11.1 fL (ref 7.5–12.5)
Monocytes Relative: 7 %
Neutro Abs: 4492 cells/uL (ref 1500–7800)
Neutrophils Relative %: 65.1 %
Platelets: 200 10*3/uL (ref 140–400)
RBC: 4.58 10*6/uL (ref 3.80–5.10)
RDW: 14.3 % (ref 11.0–15.0)
Total Lymphocyte: 27.3 %
WBC: 6.9 10*3/uL (ref 3.8–10.8)

## 2022-07-16 LAB — COMPREHENSIVE METABOLIC PANEL
AG Ratio: 1.4 (calc) (ref 1.0–2.5)
ALT: 16 U/L (ref 6–29)
AST: 26 U/L (ref 10–35)
Albumin: 4.2 g/dL (ref 3.6–5.1)
Alkaline phosphatase (APISO): 90 U/L (ref 37–153)
BUN: 22 mg/dL (ref 7–25)
CO2: 21 mmol/L (ref 20–32)
Calcium: 9.2 mg/dL (ref 8.6–10.4)
Chloride: 101 mmol/L (ref 98–110)
Creat: 0.92 mg/dL (ref 0.60–0.95)
Globulin: 2.9 g/dL (calc) (ref 1.9–3.7)
Glucose, Bld: 75 mg/dL (ref 65–99)
Potassium: 4.3 mmol/L (ref 3.5–5.3)
Sodium: 139 mmol/L (ref 135–146)
Total Bilirubin: 0.5 mg/dL (ref 0.2–1.2)
Total Protein: 7.1 g/dL (ref 6.1–8.1)

## 2022-07-16 LAB — LIPASE: Lipase: 38 U/L (ref 7–60)

## 2022-07-17 NOTE — Assessment & Plan Note (Signed)
She has longstanding back pain but it appears the more acute issue is her abdominal pain and recent black stools, especially in the setting of Eliquis use.  My concern is for sigmoid diverticulitis.  Discussed with patient.  At this point I think it is okay to defer imaging, given her recent CT. Differential diagnosis discussed with patient. Start cipro and flagyl.  Advised to start those today.  She has a prescription for prednisone but I would not start that yet. If more belly pain, then go to the ER.  Drink clear liquids for now, until the bell pain is better.  It does not appear that she has an exam consistent with polymyalgia rheumatica. I asked her to update me Monday.  She may still end up needing extra imaging for her back if her back pain persist, ie to eval for spinal stenosis.

## 2022-07-18 ENCOUNTER — Emergency Department
Admission: EM | Admit: 2022-07-18 | Discharge: 2022-07-18 | Disposition: A | Discharge status: Home / Self Care | Payer: 59 | Attending: Emergency Medicine | Admitting: Emergency Medicine

## 2022-07-18 ENCOUNTER — Other Ambulatory Visit: Payer: Self-pay

## 2022-07-18 ENCOUNTER — Emergency Department: Payer: 59

## 2022-07-18 ENCOUNTER — Encounter: Payer: Self-pay | Admitting: Radiology

## 2022-07-18 DIAGNOSIS — Z7901 Long term (current) use of anticoagulants: Secondary | ICD-10-CM | POA: Insufficient documentation

## 2022-07-18 DIAGNOSIS — I700 Atherosclerosis of aorta: Secondary | ICD-10-CM | POA: Diagnosis not present

## 2022-07-18 DIAGNOSIS — R112 Nausea with vomiting, unspecified: Secondary | ICD-10-CM | POA: Diagnosis not present

## 2022-07-18 DIAGNOSIS — J45909 Unspecified asthma, uncomplicated: Secondary | ICD-10-CM | POA: Insufficient documentation

## 2022-07-18 DIAGNOSIS — I2510 Atherosclerotic heart disease of native coronary artery without angina pectoris: Secondary | ICD-10-CM | POA: Insufficient documentation

## 2022-07-18 DIAGNOSIS — R109 Unspecified abdominal pain: Secondary | ICD-10-CM | POA: Diagnosis not present

## 2022-07-18 DIAGNOSIS — I4891 Unspecified atrial fibrillation: Secondary | ICD-10-CM | POA: Diagnosis not present

## 2022-07-18 DIAGNOSIS — R1030 Lower abdominal pain, unspecified: Secondary | ICD-10-CM | POA: Insufficient documentation

## 2022-07-18 DIAGNOSIS — N179 Acute kidney failure, unspecified: Secondary | ICD-10-CM | POA: Diagnosis not present

## 2022-07-18 DIAGNOSIS — I10 Essential (primary) hypertension: Secondary | ICD-10-CM | POA: Diagnosis not present

## 2022-07-18 LAB — URINALYSIS, ROUTINE W REFLEX MICROSCOPIC
Bacteria, UA: NONE SEEN
Bilirubin Urine: NEGATIVE
Glucose, UA: NEGATIVE mg/dL
Hgb urine dipstick: NEGATIVE
Ketones, ur: 80 mg/dL — AB
Nitrite: NEGATIVE
Protein, ur: 30 mg/dL — AB
Specific Gravity, Urine: 1.019 (ref 1.005–1.030)
pH: 5 (ref 5.0–8.0)

## 2022-07-18 LAB — COMPREHENSIVE METABOLIC PANEL
ALT: 36 U/L (ref 0–44)
AST: 59 U/L — ABNORMAL HIGH (ref 15–41)
Albumin: 4.1 g/dL (ref 3.5–5.0)
Alkaline Phosphatase: 91 U/L (ref 38–126)
Anion gap: 16 — ABNORMAL HIGH (ref 5–15)
BUN: 23 mg/dL (ref 8–23)
CO2: 16 mmol/L — ABNORMAL LOW (ref 22–32)
Calcium: 9.4 mg/dL (ref 8.9–10.3)
Chloride: 101 mmol/L (ref 98–111)
Creatinine, Ser: 1.21 mg/dL — ABNORMAL HIGH (ref 0.44–1.00)
GFR, Estimated: 44 mL/min — ABNORMAL LOW (ref >60)
Glucose, Bld: 61 mg/dL — ABNORMAL LOW (ref 70–99)
Potassium: 4.9 mmol/L (ref 3.5–5.1)
Sodium: 133 mmol/L — ABNORMAL LOW (ref 135–145)
Total Bilirubin: 1.7 mg/dL — ABNORMAL HIGH (ref 0.3–1.2)
Total Protein: 7.5 g/dL (ref 6.5–8.1)

## 2022-07-18 LAB — CBC
HCT: 43.9 % (ref 36.0–46.0)
Hemoglobin: 13.7 g/dL (ref 12.0–15.0)
MCH: 28.2 pg (ref 26.0–34.0)
MCHC: 31.2 g/dL (ref 30.0–36.0)
MCV: 90.3 fL (ref 80.0–100.0)
Platelets: 228 10*3/uL (ref 150–400)
RBC: 4.86 MIL/uL (ref 3.87–5.11)
RDW: 14.6 % (ref 11.5–15.5)
WBC: 6.9 10*3/uL (ref 4.0–10.5)
nRBC: 0 % (ref 0.0–0.2)

## 2022-07-18 LAB — CBG MONITORING, ED
Glucose-Capillary: 63 mg/dL — ABNORMAL LOW (ref 70–99)
Glucose-Capillary: 66 mg/dL — ABNORMAL LOW (ref 70–99)
Glucose-Capillary: 85 mg/dL (ref 70–99)

## 2022-07-18 LAB — LIPASE, BLOOD: Lipase: 38 U/L (ref 11–51)

## 2022-07-18 MED ORDER — ONDANSETRON 4 MG PO TBDP: 4.0000 mg | ORAL_TABLET | Freq: Three times a day (TID) | ORAL | 0 refills | Status: DC | PRN

## 2022-07-18 MED ORDER — ONDANSETRON HCL 4 MG/2ML IJ SOLN
4.0000 mg | Freq: Once | INTRAMUSCULAR | Status: AC
  Administered 2022-07-18: 4 mg via INTRAVENOUS
  Filled 2022-07-18: qty 2

## 2022-07-18 MED ORDER — MORPHINE SULFATE (PF) 4 MG/ML IV SOLN
4.0000 mg | Freq: Once | INTRAVENOUS | Status: AC
  Administered 2022-07-18: 4 mg via INTRAVENOUS
  Filled 2022-07-18: qty 1

## 2022-07-18 MED ORDER — LACTATED RINGERS IV BOLUS
1000.0000 mL | Freq: Once | INTRAVENOUS | Status: AC
  Administered 2022-07-18: 1000 mL via INTRAVENOUS

## 2022-07-18 MED ORDER — IOHEXOL 300 MG/ML  SOLN
75.0000 mL | Freq: Once | INTRAMUSCULAR | Status: AC | PRN
  Administered 2022-07-18: 75 mL via INTRAVENOUS

## 2022-07-18 MED ORDER — DEXTROSE 50 % IV SOLN
1.0000 | Freq: Once | INTRAVENOUS | Status: AC
  Administered 2022-07-18: 50 mL via INTRAVENOUS
  Filled 2022-07-18: qty 50

## 2022-07-18 NOTE — ED Triage Notes (Addendum)
Pt comes with c/o belly pain and some vomiting. Pt states she was put on meds for her belly and now is vomiting. Pt states pain is severe.   Pt also states dark stools and does take Eliquis. Pt states this has been going on for a long time.

## 2022-07-18 NOTE — ED Notes (Signed)
Pt to CT

## 2022-07-18 NOTE — ED Provider Notes (Signed)
[facility] Provider Note    Event Date/Time   First MD Initiated Contact with Patient 07/18/22 1200     (approximate)   History   Chief Complaint Emesis   HPI  [name] is a 87 y.o. female with past medical history of hypertension, hyperlipidemia, CAD, atrial fibrillation on Eliquis, and asthma who presents to the ED complaining of abdominal pain.  Patient reports that she has had 2 days of persistent pain in both sides of her lower abdomen, right greater than left.  She describes the pain as a dull ache that does not seem to be exacerbated or alleviated by nothing in particular.  It has been associated with some nausea and vomiting and she reports she has been unable to keep down either liquids or solids.  She reports some constipation with this but has not had any diarrhea and denies any dysuria, fever, or flank pain.  She reports that her stool has been dark at times but has not noticed any frank blood in her stool.     Physical Exam   Triage Vital Signs: ED Triage Vitals [07/18/22 0904]  Enc Vitals Group     BP 106/64     Pulse Rate 84     Resp 18     Temp 98 F (36.7 C)     Temp src      SpO2 100 %     Weight      Height      Head Circumference      Peak Flow      Pain Score 10     Pain Loc      Pain Edu?      Excl. in [place]?     Most recent vital signs: Vitals:   07/18/22 0904 07/18/22 1334  BP: 106/64 (!) 134/59  Pulse: 84 85  Resp: 18 20  Temp: 98 F (36.7 C) (!) 97.5 F (36.4 C)  SpO2: 100% 98%    Constitutional: Alert and oriented. Eyes: Conjunctivae are normal. Head: Atraumatic. Nose: No congestion/rhinnorhea. Mouth/Throat: Mucous membranes are moist.  Cardiovascular: Normal rate, regular rhythm. Grossly normal heart sounds.  2+ radial pulses bilaterally. Respiratory: Normal respiratory effort.  No retractions. Lungs CTAB. Gastrointestinal: Soft and tender to palpation the bilateral lower quadrants, right greater  than left. No distention. Musculoskeletal: No lower extremity tenderness nor edema.  Neurologic:  Normal speech and language. No gross focal neurologic deficits are appreciated.    ED Results / Procedures / Treatments   Labs (all labs ordered are listed, but only abnormal results are displayed) Labs Reviewed  URINALYSIS, ROUTINE W REFLEX MICROSCOPIC - Abnormal; Notable for the following components:      Result Value   Color, Urine YELLOW (*)    APPearance CLEAR (*)    Ketones, ur 80 (*)    Protein, ur 30 (*)    Leukocytes,Ua TRACE (*)    All other components within normal limits  COMPREHENSIVE METABOLIC PANEL - Abnormal; Notable for the following components:   Sodium 133 (*)    CO2 16 (*)    Glucose, Bld 61 (*)    Creatinine, Ser 1.21 (*)    AST 59 (*)    Total Bilirubin 1.7 (*)    GFR, Estimated 44 (*)    Anion gap 16 (*)    All other components within normal limits  CBG MONITORING, ED - Abnormal; Notable for the following components:   Glucose-Capillary 63 (*)  All other components within normal limits  CBG MONITORING, ED - Abnormal; Notable for the following components:   Glucose-Capillary 66 (*)    All other components within normal limits  CBC  LIPASE, BLOOD  CBG MONITORING, ED  TYPE AND SCREEN   RADIOLOGY CT abdomen/pelvis reviewed and interpreted by me with no inflammatory changes, focal fluid collections, or dilated bowel loops.  PROCEDURES:  Critical Care performed: No  Procedures   MEDICATIONS ORDERED IN ED: Medications  dextrose 50 % solution 50 mL (50 mLs Intravenous Given 07/18/22 1322)  ondansetron (ZOFRAN) injection 4 mg (4 mg Intravenous Given 07/18/22 1325)  lactated ringers bolus 1,000 mL (0 mLs Intravenous Stopped 07/18/22 1451)  morphine (PF) 4 MG/ML injection 4 mg (4 mg Intravenous Given 07/18/22 1326)  iohexol (OMNIPAQUE) 300 MG/ML solution 75 mL (75 mLs Intravenous Contrast Given 07/18/22 1355)     IMPRESSION / MDM / ASSESSMENT AND PLAN / ED  COURSE  I reviewed the triage vital signs and the nursing notes.                              87 y.o. female with past medical history of hypertension, hyperlipidemia, CAD, atrial fibrillation on Eliquis, and asthma who presents to the ED complaining of 2 days of lower abdominal pain with nausea and vomiting.  Patient's presentation is most consistent with acute presentation with potential threat to life or bodily function.  Differential diagnosis includes, but is not limited to, bowel obstruction, appendicitis, diverticulitis, UTI, gastroenteritis, dehydration, electrolyte abnormality, AKI, cholecystitis, biliary colic, hepatitis, pancreatitis, GI bleed.  Patient nontoxic-appearing and in no acute distress, vital signs are unremarkable.  She does have tenderness to palpation of the bilateral lower quadrants of her abdomen, right greater than left.  Labs show mild AKI and associated acidosis with mild hypoglycemia, likely due to her decreased oral intake.  There is mild elevation in her bilirubin but remainder of LFTs are unremarkable, no significant anemia or leukocytosis noted.  Urinalysis shows ketones but no signs of UTI, CT imaging remarkable for cholelithiasis but no evidence of cholecystitis.  I doubt gallstones as the source of her symptoms given no upper abdominal pain.  She reports feeling much better following IV fluids, morphine, and Zofran.  She is now eager to have something to eat, tolerating water and crackers without difficulty.  Blood glucose improved and stable on recheck.  She was offered admission but prefers to be discharged home and follow-up with her PCP, which seems reasonable.  She was counseled to follow-up with PCP for repeat check of kidney function and to return to the ED for new or worsening symptoms.  Patient agrees with plan.      FINAL CLINICAL IMPRESSION(S) / ED DIAGNOSES   Final diagnoses:  Nausea and vomiting, unspecified vomiting type  AKI (acute kidney  injury) (HCC)  Lower abdominal pain     Rx / DC Orders   ED Discharge Orders          Ordered    ondansetron (ZOFRAN-ODT) 4 MG disintegrating tablet  Every 8 hours PRN,   Status:  Discontinued        07/18/22 1456    ondansetron (ZOFRAN-ODT) 4 MG disintegrating tablet  Every 8 hours PRN        07/18/22 1456             Note:  This document was prepared using Dragon voice recognition software and may include  unintentional dictation errors.   Chesley Noon, MD 07/18/22 [phone]

## 2022-07-18 NOTE — ED Notes (Signed)
Pt given phone to call granddaughter to come and get her.

## 2022-07-18 NOTE — ED Notes (Signed)
Pt ambulated to in room toilet with the use of hospital provided walker.

## 2022-07-20 ENCOUNTER — Emergency Department: Payer: 59

## 2022-07-20 ENCOUNTER — Other Ambulatory Visit: Payer: Self-pay

## 2022-07-20 ENCOUNTER — Observation Stay
Admission: EM | Admit: 2022-07-20 | Discharge: 2022-07-22 | Disposition: A | Discharge status: Home / Self Care | Payer: 59 | Attending: Family Medicine | Admitting: Family Medicine

## 2022-07-20 ENCOUNTER — Encounter: Payer: Self-pay | Admitting: Family Medicine

## 2022-07-20 DIAGNOSIS — I4891 Unspecified atrial fibrillation: Secondary | ICD-10-CM | POA: Insufficient documentation

## 2022-07-20 DIAGNOSIS — I482 Chronic atrial fibrillation, unspecified: Secondary | ICD-10-CM

## 2022-07-20 DIAGNOSIS — Z96653 Presence of artificial knee joint, bilateral: Secondary | ICD-10-CM | POA: Diagnosis not present

## 2022-07-20 DIAGNOSIS — K922 Gastrointestinal hemorrhage, unspecified: Principal | ICD-10-CM | POA: Insufficient documentation

## 2022-07-20 DIAGNOSIS — K921 Melena: Secondary | ICD-10-CM

## 2022-07-20 DIAGNOSIS — Z955 Presence of coronary angioplasty implant and graft: Secondary | ICD-10-CM | POA: Diagnosis not present

## 2022-07-20 DIAGNOSIS — Z7901 Long term (current) use of anticoagulants: Secondary | ICD-10-CM | POA: Insufficient documentation

## 2022-07-20 DIAGNOSIS — I10 Essential (primary) hypertension: Secondary | ICD-10-CM | POA: Diagnosis present

## 2022-07-20 DIAGNOSIS — I2510 Atherosclerotic heart disease of native coronary artery without angina pectoris: Secondary | ICD-10-CM | POA: Diagnosis not present

## 2022-07-20 DIAGNOSIS — K2971 Gastritis, unspecified, with bleeding: Secondary | ICD-10-CM | POA: Diagnosis not present

## 2022-07-20 DIAGNOSIS — Z86718 Personal history of other venous thrombosis and embolism: Secondary | ICD-10-CM | POA: Insufficient documentation

## 2022-07-20 DIAGNOSIS — I259 Chronic ischemic heart disease, unspecified: Secondary | ICD-10-CM | POA: Diagnosis present

## 2022-07-20 DIAGNOSIS — K222 Esophageal obstruction: Secondary | ICD-10-CM | POA: Insufficient documentation

## 2022-07-20 DIAGNOSIS — R1030 Lower abdominal pain, unspecified: Secondary | ICD-10-CM | POA: Diagnosis not present

## 2022-07-20 DIAGNOSIS — R9431 Abnormal electrocardiogram [ECG] [EKG]: Secondary | ICD-10-CM | POA: Diagnosis not present

## 2022-07-20 DIAGNOSIS — R0902 Hypoxemia: Secondary | ICD-10-CM | POA: Diagnosis not present

## 2022-07-20 DIAGNOSIS — R1084 Generalized abdominal pain: Secondary | ICD-10-CM | POA: Diagnosis not present

## 2022-07-20 LAB — CBC WITH DIFFERENTIAL/PLATELET
Abs Immature Granulocytes: 0.01 10*3/uL (ref 0.00–0.07)
Basophils Absolute: 0 10*3/uL (ref 0.0–0.1)
Basophils Relative: 0 %
Eosinophils Absolute: 0 10*3/uL (ref 0.0–0.5)
Eosinophils Relative: 0 %
HCT: 38.8 % (ref 36.0–46.0)
Hemoglobin: 12.5 g/dL (ref 12.0–15.0)
Immature Granulocytes: 0 %
Lymphocytes Relative: 22 %
Lymphs Abs: 1.3 10*3/uL (ref 0.7–4.0)
MCH: 28.5 pg (ref 26.0–34.0)
MCHC: 32.2 g/dL (ref 30.0–36.0)
MCV: 88.4 fL (ref 80.0–100.0)
Monocytes Absolute: 0.6 10*3/uL (ref 0.1–1.0)
Monocytes Relative: 9 %
Neutro Abs: 4.2 10*3/uL (ref 1.7–7.7)
Neutrophils Relative %: 69 %
Platelets: 159 10*3/uL (ref 150–400)
RBC: 4.39 MIL/uL (ref 3.87–5.11)
RDW: 14.9 % (ref 11.5–15.5)
WBC: 6.1 10*3/uL (ref 4.0–10.5)
nRBC: 0 % (ref 0.0–0.2)

## 2022-07-20 LAB — TYPE AND SCREEN
ABO/RH(D): A POS
ABO/RH(D): A POS
Antibody Screen: NEGATIVE
Antibody Screen: NEGATIVE

## 2022-07-20 LAB — URINALYSIS, ROUTINE W REFLEX MICROSCOPIC
Bacteria, UA: NONE SEEN
Bilirubin Urine: NEGATIVE
Glucose, UA: NEGATIVE mg/dL
Hgb urine dipstick: NEGATIVE
Ketones, ur: 20 mg/dL — AB
Nitrite: NEGATIVE
Protein, ur: NEGATIVE mg/dL
Specific Gravity, Urine: 1.044 — ABNORMAL HIGH (ref 1.005–1.030)
pH: 6 (ref 5.0–8.0)

## 2022-07-20 LAB — COMPREHENSIVE METABOLIC PANEL
ALT: 36 U/L (ref 0–44)
AST: 52 U/L — ABNORMAL HIGH (ref 15–41)
Albumin: 3.6 g/dL (ref 3.5–5.0)
Alkaline Phosphatase: 76 U/L (ref 38–126)
Anion gap: 4 — ABNORMAL LOW (ref 5–15)
BUN: 19 mg/dL (ref 8–23)
CO2: 28 mmol/L (ref 22–32)
Calcium: 9.1 mg/dL (ref 8.9–10.3)
Chloride: 102 mmol/L (ref 98–111)
Creatinine, Ser: 1.06 mg/dL — ABNORMAL HIGH (ref 0.44–1.00)
GFR, Estimated: 52 mL/min — ABNORMAL LOW (ref >60)
Glucose, Bld: 99 mg/dL (ref 70–99)
Potassium: 3.7 mmol/L (ref 3.5–5.1)
Sodium: 134 mmol/L — ABNORMAL LOW (ref 135–145)
Total Bilirubin: 1 mg/dL (ref 0.3–1.2)
Total Protein: 6.7 g/dL (ref 6.5–8.1)

## 2022-07-20 LAB — HEMOGLOBIN AND HEMATOCRIT, BLOOD
HCT: 35.8 % — ABNORMAL LOW (ref 36.0–46.0)
HCT: 35.1 % — ABNORMAL LOW (ref 36.0–46.0)
HCT: 34.5 % — ABNORMAL LOW (ref 36.0–46.0)
Hemoglobin: 11.6 g/dL — ABNORMAL LOW (ref 12.0–15.0)
Hemoglobin: 11.4 g/dL — ABNORMAL LOW (ref 12.0–15.0)
Hemoglobin: 11.2 g/dL — ABNORMAL LOW (ref 12.0–15.0)

## 2022-07-20 LAB — PROTIME-INR
INR: 1.2 (ref 0.8–1.2)
Prothrombin Time: 15.7 seconds — ABNORMAL HIGH (ref 11.4–15.2)

## 2022-07-20 LAB — APTT: aPTT: 38 seconds — ABNORMAL HIGH (ref 24–36)

## 2022-07-20 MED ORDER — PANTOPRAZOLE INFUSION (NEW) - SIMPLE MED
8.0000 mg/h | INTRAVENOUS | Status: DC
  Administered 2022-07-20: 8 mg/h via INTRAVENOUS
  Filled 2022-07-20: qty 100

## 2022-07-20 MED ORDER — ONDANSETRON HCL 4 MG PO TABS: 4.0000 mg | ORAL_TABLET | Freq: Four times a day (QID) | ORAL | Status: DC | PRN

## 2022-07-20 MED ORDER — LACTATED RINGERS IV BOLUS
1000.0000 mL | Freq: Once | INTRAVENOUS | Status: AC
  Administered 2022-07-20: 1000 mL via INTRAVENOUS

## 2022-07-20 MED ORDER — SODIUM CHLORIDE 0.9 % IV SOLN: INTRAVENOUS | Status: DC

## 2022-07-20 MED ORDER — HYDROMORPHONE HCL 1 MG/ML IJ SOLN
0.5000 mg | INTRAMUSCULAR | Status: DC | PRN
  Administered 2022-07-21: 0.5 mg via INTRAVENOUS
  Filled 2022-07-20: qty 1

## 2022-07-20 MED ORDER — ONDANSETRON HCL 4 MG/2ML IJ SOLN
4.0000 mg | Freq: Once | INTRAMUSCULAR | Status: AC
  Administered 2022-07-20: 4 mg via INTRAVENOUS
  Filled 2022-07-20: qty 2

## 2022-07-20 MED ORDER — IOHEXOL 300 MG/ML  SOLN
100.0000 mL | Freq: Once | INTRAMUSCULAR | Status: AC | PRN
  Administered 2022-07-20: 100 mL via INTRAVENOUS

## 2022-07-20 MED ORDER — MELATONIN 5 MG PO TABS
5.0000 mg | ORAL_TABLET | Freq: Every evening | ORAL | Status: DC | PRN
  Administered 2022-07-20 – 2022-07-21 (×2): 5 mg via ORAL
  Filled 2022-07-20 (×2): qty 1

## 2022-07-20 MED ORDER — MORPHINE SULFATE (PF) 2 MG/ML IV SOLN: 2.0000 mg | INTRAVENOUS | Status: DC | PRN

## 2022-07-20 MED ORDER — PANTOPRAZOLE SODIUM 40 MG IV SOLR: 40.0000 mg | Freq: Two times a day (BID) | INTRAVENOUS | Status: DC

## 2022-07-20 MED ORDER — MORPHINE SULFATE (PF) 2 MG/ML IV SOLN
2.0000 mg | Freq: Once | INTRAVENOUS | Status: AC
  Administered 2022-07-20: 2 mg via INTRAVENOUS
  Filled 2022-07-20: qty 1

## 2022-07-20 MED ORDER — HYDROMORPHONE HCL 1 MG/ML IJ SOLN
1.0000 mg | INTRAMUSCULAR | Status: DC | PRN
  Administered 2022-07-20: 1 mg via INTRAVENOUS
  Filled 2022-07-20: qty 1

## 2022-07-20 MED ORDER — PANTOPRAZOLE 80MG IVPB - SIMPLE MED
80.0000 mg | Freq: Once | INTRAVENOUS | Status: AC
  Administered 2022-07-20: 80 mg via INTRAVENOUS
  Filled 2022-07-20: qty 100

## 2022-07-20 MED ORDER — ONDANSETRON HCL 4 MG/2ML IJ SOLN: 4.0000 mg | Freq: Four times a day (QID) | INTRAMUSCULAR | Status: DC | PRN

## 2022-07-20 NOTE — Assessment & Plan Note (Signed)
Rate controlled on presentation  Hold eliquis in setting of GI bleeding

## 2022-07-20 NOTE — ED Provider Notes (Signed)
[facility] Provider Note    Event Date/Time   First MD Initiated Contact with Patient 07/20/22 0327     (approximate)   History   Melena   HPI  [name] is a 87 y.o. female who presents to the ED for evaluation of Melena   I reviewed cardiology clinic visit from 4/25.  History of CAD, PAD, HTN and HLD anticoagulated on Eliquis. She was seen here couple days ago for nausea and vomiting.  Reassuring workup including CT imaging and discharged home with antiemetics.  She returns to the ED for evaluation of lower abdominal pain and melena.  Reports multiple melanotic stools without any hematochezia, hematemesis or other bleeding diatheses.  No urinary symptoms.  No syncope or fevers.    Physical Exam   Triage Vital Signs: ED Triage Vitals  Enc Vitals Group     BP      Pulse      Resp      Temp      Temp src      SpO2      Weight      Height      Head Circumference      Peak Flow      Pain Score      Pain Loc      Pain Edu?      Excl. in GC?     Most recent vital signs: Vitals:   07/20/22 0500 07/20/22 0630  BP: (!) 101/58 (!) 99/53  Pulse: 79 73  Resp: 19 18  Temp:    SpO2:  100%    General: Awake, no distress.  CV:  Good peripheral perfusion.  Resp:  Normal effort.  Abd:  No distention.  Mild diffuse lower abdominal tenderness without peritoneal features. MSK:  No deformity noted.  Neuro:  No focal deficits appreciated. Other:     ED Results / Procedures / Treatments   Labs (all labs ordered are listed, but only abnormal results are displayed) Labs Reviewed  COMPREHENSIVE METABOLIC PANEL - Abnormal; Notable for the following components:      Result Value   Sodium 134 (*)    Creatinine, Ser 1.06 (*)    AST 52 (*)    GFR, Estimated 52 (*)    Anion gap 4 (*)    All other components within normal limits  CBC WITH DIFFERENTIAL/PLATELET  URINALYSIS, ROUTINE W REFLEX MICROSCOPIC    EKG   RADIOLOGY CT  abdomen/pelvis interpreted me without evidence of acute intra-abdominal pathology.  Official radiology report(s): CT ABDOMEN PELVIS W CONTRAST  Result Date: 07/20/2022 CLINICAL DATA:  Lower abdominal pain and melena EXAM: CT ABDOMEN AND PELVIS WITH CONTRAST TECHNIQUE: Multidetector CT imaging of the abdomen and pelvis was performed using the standard protocol following bolus administration of intravenous contrast. RADIATION DOSE REDUCTION: This exam was performed according to the departmental dose-optimization program which includes automated exposure control, adjustment of the mA and/or kV according to patient size and/or use of iterative reconstruction technique. CONTRAST:  OMNIPAQUE IOHEXOL 300 MG/ML  SOLN COMPARISON:  07/18/2022 FINDINGS: Lower chest: Extensive atheromatous calcification of the coronaries. Heavy calcification at the aortic valve. Hepatobiliary: No focal liver abnormality.Cholelithiasis and gallbladder sludge. No indication of acute cholecystitis or ductal obstruction. Pancreas:  No acute finding. Mild generalized atrophy for age. Spleen: Granulomatous type calcifications. No acute finding. Adrenals/Urinary Tract: Negative adrenals. No hydronephrosis or stone. Unremarkable bladder. Stomach/Bowel: No obstruction. No visible bowel inflammation. Numerous left colonic diverticula. Vascular/Lymphatic:  No acute vascular abnormality. Extensive atheromatous calcification of the aorta, iliacs, and proximal visceral branches. No mass or adenopathy. Reproductive:No pathologic findings. Other: No ascites or pneumoperitoneum. Musculoskeletal: No acute abnormalities. Generalized spinal degeneration and subjective osteopenia. IMPRESSION: 1. No acute finding.  No clear source of GI bleeding. 2. Atherosclerosis, cholelithiasis, colonic diverticulosis, and heavy aortic valve calcification. Electronically Signed   By: Tiburcio Pea M.D.   On: 07/20/2022 05:00    PROCEDURES and INTERVENTIONS:  .1-3  Lead EKG Interpretation  Performed by: Delton Prairie, MD Authorized by: Delton Prairie, MD     Interpretation: normal     ECG rate:  74   ECG rate assessment: normal     Rhythm: sinus rhythm     Ectopy: none     Conduction: normal     Medications  pantoprazole (PROTONIX) 80 mg /NS 100 mL IVPB (has no administration in time range)  pantoprozole (PROTONIX) 80 mg /NS 100 mL infusion (has no administration in time range)  pantoprazole (PROTONIX) injection 40 mg (has no administration in time range)  iohexol (OMNIPAQUE) 300 MG/ML solution 100 mL (100 mLs Intravenous Contrast Given 07/20/22 0443)     IMPRESSION / MDM / ASSESSMENT AND PLAN / ED COURSE  I reviewed the triage vital signs and the nursing notes.  Differential diagnosis includes, but is not limited to, lower GI bleed, upper GI bleed, diverticulitis, SBO, medication side effect  {Patient presents with symptoms of an acute illness or injury that is potentially life-threatening.  87 year old vasculopath anticoagulated on Eliquis presents to the ED with melena and a 1 point hemoglobin drop, require medical admission.  She looks systemically well has some lower abdominal tenderness but no peritoneal features.  Hemoglobin is dropped 1.5 yesterday.  Medications for transfusions we will start her on a PPI.  No known varices to indicate octreotide.  CT is reassuring and urine without infectious features.  While she is initially asking to go home, after multiple discussions she is willing to stay for GI evaluation considering her small hemoglobin drop and melanotic stool.  Will consult medicine.  Clinical Course as of 07/20/22 0659  Wed Jul 20, 2022  0534 Reassessed.  Patient comfortably asleep and awakens easily to vocal stimulation.  Patient reports feeling better despite no intervention.  We discussed reassuring workup so far and the need for urine sample.  She is asking if she can go home, but asked her at least wait for a urine sample. [DS]   [phone] Reassessed and discussed my concerns with the patient and urged her to stay and she is willing to stay due to her upper GI bleeding. [DS]    Clinical Course User Index [DS] Delton Prairie, MD     FINAL CLINICAL IMPRESSION(S) / ED DIAGNOSES   Final diagnoses:  Upper GI bleed  Melena  Anticoagulated     Rx / DC Orders   ED Discharge Orders     None        Note:  This document was prepared using Dragon voice recognition software and may include unintentional dictation errors.   Delton Prairie, MD 07/20/22 0700

## 2022-07-20 NOTE — ED Notes (Signed)
The pt was placed on 2 liters of O2 via nasal cannula due to her pulse ox reading 87 to 89 percent on room air.
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
## 2022-07-20 NOTE — ED Triage Notes (Signed)
Pt BIB EMS from home c/o black, tarry stools and 10/10 abd pain, pt seen here Monday for same.
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
## 2022-07-20 NOTE — Assessment & Plan Note (Signed)
Patient with acute on chronic melanotic stools x 3 years per patient 3-4 large melanotic to black stools over the past 3 to 4 days Will hold anticoagulation Hemoglobin fairly stable at 12.7 today IV PPI Trend hgb  Transfuse for hgb <7 GI consultation

## 2022-07-20 NOTE — Assessment & Plan Note (Signed)
LLN BP on presentation w/ GI bleeding  Titrate home regimen

## 2022-07-20 NOTE — Plan of Care (Signed)
  Problem: Education: Goal: Knowledge of General Education information will improve Description: Including pain rating scale, medication(s)/side effects and non-pharmacologic comfort measures Outcome: Progressing   Problem: Pain Managment: Goal: General experience of comfort will improve Outcome: Progressing   Problem: Safety: Goal: Ability to remain free from injury will improve Outcome: Progressing   Problem: Skin Integrity: Goal: Risk for impaired skin integrity will decrease Outcome: Progressing   Problem: Education: Goal: Knowledge of General Education information will improve Description: Including pain rating scale, medication(s)/side effects and non-pharmacologic comfort measures Outcome: Progressing   Problem: Pain Managment: Goal: General experience of comfort will improve Outcome: Progressing   Problem: Safety: Goal: Ability to remain free from injury will improve Outcome: Progressing   Problem: Skin Integrity: Goal: Risk for impaired skin integrity will decrease Outcome: Progressing   

## 2022-07-20 NOTE — Assessment & Plan Note (Signed)
Patient with acute on chronic melanotic stools x 3 years per patient 3-4 large melanotic to black stools over the past 3 to 4 days Will hold anticoagulation Hemoglobin fairly stable at 12.7 today IV PPI GI consultation

## 2022-07-20 NOTE — ED Notes (Signed)
The pt is an 70 yof with lying supine in the bed with her head slightly elevated. The pt is warm, pink, and dry. The pt is alert and oriented x 4. The pt advised she is having some abdominal pain and nausea. The pt's bed was cleaned and the pt was placed in a gown.

## 2022-07-20 NOTE — ED Notes (Addendum)
The pt denies any pain at this time. The pt advised the medicine she received earlier for pain made her feel out of the world. The pt is warm, pink, and dry. The pt is alert and oriented x 4.

## 2022-07-20 NOTE — Assessment & Plan Note (Signed)
No active chest pain  Cont home regimen  Hold AC/antiplatelet medication in setting of GI bleeding

## 2022-07-21 ENCOUNTER — Inpatient Hospital Stay: Payer: 59 | Admitting: Certified Registered Nurse Anesthetist

## 2022-07-21 ENCOUNTER — Encounter: Payer: Self-pay | Admitting: Internal Medicine

## 2022-07-21 ENCOUNTER — Encounter
Admission: EM | Disposition: A | Discharge status: Home / Self Care | Payer: Self-pay | Source: Home / Self Care | Attending: Emergency Medicine

## 2022-07-21 DIAGNOSIS — K222 Esophageal obstruction: Secondary | ICD-10-CM | POA: Diagnosis not present

## 2022-07-21 DIAGNOSIS — I4891 Unspecified atrial fibrillation: Secondary | ICD-10-CM | POA: Diagnosis not present

## 2022-07-21 DIAGNOSIS — K2970 Gastritis, unspecified, without bleeding: Secondary | ICD-10-CM | POA: Diagnosis not present

## 2022-07-21 DIAGNOSIS — K921 Melena: Secondary | ICD-10-CM | POA: Diagnosis not present

## 2022-07-21 DIAGNOSIS — K922 Gastrointestinal hemorrhage, unspecified: Secondary | ICD-10-CM | POA: Diagnosis not present

## 2022-07-21 HISTORY — PX: ESOPHAGOGASTRODUODENOSCOPY (EGD) WITH PROPOFOL: SHX5813

## 2022-07-21 LAB — COMPREHENSIVE METABOLIC PANEL
ALT: 28 U/L (ref 0–44)
AST: 38 U/L (ref 15–41)
Albumin: 3 g/dL — ABNORMAL LOW (ref 3.5–5.0)
Alkaline Phosphatase: 62 U/L (ref 38–126)
Anion gap: 5 (ref 5–15)
BUN: 13 mg/dL (ref 8–23)
CO2: 25 mmol/L (ref 22–32)
Calcium: 8.3 mg/dL — ABNORMAL LOW (ref 8.9–10.3)
Chloride: 107 mmol/L (ref 98–111)
Creatinine, Ser: 0.85 mg/dL (ref 0.44–1.00)
GFR, Estimated: >60 mL/min (ref >60)
Glucose, Bld: 105 mg/dL — ABNORMAL HIGH (ref 70–99)
Potassium: 3.4 mmol/L — ABNORMAL LOW (ref 3.5–5.1)
Sodium: 137 mmol/L (ref 135–145)
Total Bilirubin: 0.8 mg/dL (ref 0.3–1.2)
Total Protein: 5.5 g/dL — ABNORMAL LOW (ref 6.5–8.1)

## 2022-07-21 LAB — HEMOGLOBIN AND HEMATOCRIT, BLOOD
HCT: 34.8 % — ABNORMAL LOW (ref 36.0–46.0)
HCT: 36.4 % (ref 36.0–46.0)
HCT: 36.9 % (ref 36.0–46.0)
HCT: 34.1 % — ABNORMAL LOW (ref 36.0–46.0)
Hemoglobin: 11.1 g/dL — ABNORMAL LOW (ref 12.0–15.0)
Hemoglobin: 11.7 g/dL — ABNORMAL LOW (ref 12.0–15.0)
Hemoglobin: 11.8 g/dL — ABNORMAL LOW (ref 12.0–15.0)
Hemoglobin: 11.2 g/dL — ABNORMAL LOW (ref 12.0–15.0)

## 2022-07-21 SURGERY — ESOPHAGOGASTRODUODENOSCOPY (EGD) WITH PROPOFOL
Anesthesia: General

## 2022-07-21 MED ORDER — SODIUM CHLORIDE 0.9 % IV SOLN: INTRAVENOUS | Status: DC

## 2022-07-21 MED ORDER — POTASSIUM CHLORIDE 10 MEQ/100ML IV SOLN
10.0000 meq | INTRAVENOUS | Status: AC
  Administered 2022-07-21 (×2): 10 meq via INTRAVENOUS
  Filled 2022-07-21 (×2): qty 100

## 2022-07-21 MED ORDER — PROPOFOL 500 MG/50ML IV EMUL
INTRAVENOUS | Status: DC | PRN
  Administered 2022-07-21: 125 ug/kg/min via INTRAVENOUS

## 2022-07-21 MED ORDER — PROPOFOL 10 MG/ML IV BOLUS
INTRAVENOUS | Status: DC | PRN
  Administered 2022-07-21: 40 mg via INTRAVENOUS

## 2022-07-21 MED ORDER — LIDOCAINE HCL (CARDIAC) PF 100 MG/5ML IV SOSY
PREFILLED_SYRINGE | INTRAVENOUS | Status: DC | PRN
  Administered 2022-07-21: 50 mg via INTRAVENOUS

## 2022-07-21 NOTE — Op Note (Signed)
[facility] Gastroenterology Patient Name: [name] Procedure Date: 07/21/2022 11:17 AM MRN: [id number] Account #: [id number] Date of Birth: [birth year] Admit Type: Inpatient Age: 87 Room: [facility] ENDO ROOM 3 Gender: Female Note Status: Finalized Instrument Name: Upper Endoscope [id number] Procedure:             Upper GI endoscopy Indications:           Melena Providers:             Boykin Nearing. Norma Fredrickson MD, MD Referring MD:          Dwana Curd. Para March, MD (Referring MD) Medicines:             Propofol per Anesthesia Complications:         No immediate complications. Estimated blood loss: None. Procedure:             Pre-Anesthesia Assessment:                        - The risks and benefits of the procedure and the                         sedation options and risks were discussed with the                         patient. All questions were answered and informed                         consent was obtained.                        - Patient identification and proposed procedure were                         verified prior to the procedure by the nurse. The                         procedure was verified in the procedure room.                        - ASA Grade Assessment: III - A patient with severe                         systemic disease.                        - After reviewing the risks and benefits, the patient                         was deemed in satisfactory condition to undergo the                         procedure.                        After obtaining informed consent, the endoscope was                         passed under direct vision. Throughout the procedure,  the patient's blood pressure, pulse, and oxygen                         saturations were monitored continuously. The Endoscope                         was introduced through the mouth, and advanced to the                         third part of duodenum. The upper GI endoscopy was                          accomplished without difficulty. The patient tolerated                         the procedure well. Findings:      One benign-appearing, intrinsic mild stenosis was found at the       gastroesophageal junction. This stenosis measured 1.7 cm (inner       diameter) x less than one cm (in length). The stenosis was traversed.      The exam of the esophagus was otherwise normal.      Patchy minimal inflammation characterized by erythema was found in the       gastric antrum.      The cardia and gastric fundus were normal on retroflexion.      The examined duodenum was normal.      The exam was otherwise without abnormality. Impression:            - Benign-appearing esophageal stenosis.                        - Gastritis.                        - Normal examined duodenum.                        - The examination was otherwise normal.                        - No specimens collected. Recommendation:        - Return patient to hospital ward for possible                         discharge same day.                        - Advance diet as tolerated.                        - No significant bleeding. Will sign off. Procedure Code(s):     --- Professional ---                        [phone], Esophagogastroduodenoscopy, flexible,                         transoral; diagnostic, including collection of                         specimen(s) by brushing or washing, when performed                         (  separate procedure) Diagnosis Code(s):     --- Professional ---                        K92.1, Melena (includes Hematochezia)                        K29.70, Gastritis, unspecified, without bleeding                        K22.2, Esophageal obstruction CPT copyright 2022 American Medical Association. All rights reserved. The codes documented in this report are preliminary and upon coder review may  be revised to meet current compliance requirements. Stanton Kidney MD, MD 07/21/2022  11:40:29 AM This report has been signed electronically. Number of Addenda: 0 Note Initiated On: 07/21/2022 11:17 AM Estimated Blood Loss:  Estimated blood loss: none.      [facility]
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
## 2022-07-21 NOTE — Plan of Care (Signed)
  Problem: Education: Goal: Knowledge of General Education information will improve Description: Including pain rating scale, medication(s)/side effects and non-pharmacologic comfort measures Outcome: Progressing   Problem: Pain Managment: Goal: General experience of comfort will improve Outcome: Progressing   Problem: Safety: Goal: Ability to remain free from injury will improve Outcome: Progressing   

## 2022-07-21 NOTE — Care Management CC44 (Signed)
Condition Code 44 Documentation Completed  Patient Details  Name: [name] MRN: [id number] Date of Birth: March 25, 1935   Condition Code 44 given:  Yes Patient signature on Condition Code 44 notice:  Yes Documentation of 2 MD's agreement:  Yes Code 44 added to claim:  Yes    Darleene Cleaver, LCSW 07/21/2022, 5:08 PM

## 2022-07-21 NOTE — Transfer of Care (Signed)
Immediate Anesthesia Transfer of Care Note  Patient: [name]  Procedure(s) Performed: ESOPHAGOGASTRODUODENOSCOPY (EGD) WITH PROPOFOL  Patient Location: Endoscopy Unit  Anesthesia Type:General  Level of Consciousness: awake, alert , and oriented  Airway & Oxygen Therapy: Patient Spontanous Breathing  Post-op Assessment: Report given to RN and Post -op Vital signs reviewed and stable  Post vital signs: Reviewed and stable  Last Vitals:  Vitals Value Taken Time  BP 127/74 07/21/22 1142  Temp 36.8 C 07/21/22 1142  Pulse 76 07/21/22 1145  Resp 19 07/21/22 1145  SpO2 97 % 07/21/22 1145  Vitals shown include unvalidated device data.  Last Pain:  Vitals:   07/21/22 1142  TempSrc: Temporal  PainSc:          Complications: No notable events documented.

## 2022-07-21 NOTE — Interval H&P Note (Signed)
History and Physical Interval Note:  07/21/2022 11:28 AM  [name]  has presented today for surgery, with the diagnosis of Melena/UGIB.  The various methods of treatment have been discussed with the patient and family. After consideration of risks, benefits and other options for treatment, the patient has consented to  Procedure(s): ESOPHAGOGASTRODUODENOSCOPY (EGD) WITH PROPOFOL (N/A) as a surgical intervention.  The patient's history has been reviewed, patient examined, no change in status, stable for surgery.  I have reviewed the patient's chart and labs.  Questions were answered to the patient's satisfaction.     [place], [place]

## 2022-07-21 NOTE — Progress Notes (Signed)
PROGRESS NOTE    [name]  OZH:[id number] DOB: Jan 03, 1936 DOA: 07/20/2022  PCP: Joaquim Nam, MD   Brief Narrative: This 87 years old female with PMH significant for coronary artery disease, s/p stenting, atrial fibrillation on Eliquis, GERD, hypertension presented in the ED with GI bleeding.  Patient reports longstanding history of intermittent melanotic and black stools for multiple years.  She did have colonoscopy few years back and was found to have polyps.  There was some concern for possible colon surgery which is relatively unclear.  Patient has worsening episodes over last 2 to 3 days.  She reports generalized abdominal pain associated with black stools.  She is hemodynamically stable.  CT abdomen and pelvis no evidence of acute abnormalities.  No clear source of GI bleeding.  H&H 12.7.  Patient is admitted for further evaluation.  GI is consulted.  Assessment & Plan:   Principal Problem:   GIB (gastrointestinal bleeding) Active Problems:   Essential hypertension   CORONARY ARTERY DISEASE, S/P PTCA   Atrial fibrillation (HCC)  Acute on chronic GI bleeding: Patient reports having intermittent episodes of black stools for many years. She does have colonoscopy few years back,  found to have colonic polyps. She reports worsening over the last 3 to 4 days. Will hold Eliquis in the setting of GI bleed. CTA abdomen showed no evidence of GI bleeding. Continue IV pantoprazole 40 mg every 12 hours. Hemoglobin remains stable @ 12.7. Transfuse for hemoglobin below 7.0 GI is consulted.  Scheduled for EGD today.  Atrial fibrillation: Heart rate is well-controlled. Hold Eliquis in the setting of GI bleeding.  Coronary artery disease s/p PTCA Patient denies any chest pain.   Hold anticoagulation/antiplatelet medication in the setting of GI bleeding. Continue Crestor.  Essential hypertension: Blood pressure is on the soft side. Hold blood pressure medications.  DVT  prophylaxis: SCDs Code Status: Full code Family Communication: No family at bedside Disposition Plan:  Status is: Inpatient Remains inpatient appropriate because: Admitted for GI bleeding.  Hemodynamically stable, H&H stable.  GI is consulted,  patient is scheduled to have EGD today.   Consultants:  Gastroenterology  Procedures: Scheduled EGD Antimicrobials: None  Subjective: Patient was seen and examined at bedside.  Overnight events noted.   She reports doing better.She still reports having black-colored stools.   She is going to have EGD today.  Objective: Vitals:   07/20/22 1340 07/20/22 1614 07/20/22 2324 07/21/22 0807  BP: (!) 121/55 124/61 (!) 108/57 125/63  Pulse: 75 89 72 75  Resp: 18 17 16 19   Temp: 98.1 F (36.7 C) 97.9 F (36.6 C) 98.5 F (36.9 C) 99.1 F (37.3 C)  TempSrc: Oral   Oral  SpO2: 97% 96% 94% 95%  Weight:      Height:        Intake/Output Summary (Last 24 hours) at 07/21/2022 1041 Last data filed at 07/20/2022 1714 Gross per 24 hour  Intake 603.93 ml  Output --  Net 603.93 ml   Filed Weights   07/20/22 0342  Weight: 83.5 kg    Examination:  General exam: Appears calm and comfortable, Deconditioned, not in any distress. Respiratory system: Clear to auscultation. Respiratory effort normal.  RR 15 Cardiovascular system: S1 & S2 heard, RRR. No JVD, murmurs, rubs, gallops or clicks.  Gastrointestinal system: Abdomen is soft, mildly tender, non distended, BS+ Central nervous system: Alert and oriented x 3. No focal neurological deficits. Extremities: No edema, no cyanosis, no clubbing. Skin: No rashes, lesions  or ulcers Psychiatry: Judgement and insight appear normal. Mood & affect appropriate.     Data Reviewed: I have personally reviewed following labs and imaging studies  CBC: Recent Labs  Lab 07/15/22 1501 07/18/22 0906 07/20/22 0359 07/20/22 1005 07/20/22 1545 07/20/22 2302 07/21/22 0435  WBC 6.9 6.9 6.1  --   --   --   --    NEUTROABS 4,492  --  4.2  --   --   --   --   HGB 13.1 13.7 12.5 11.6* 11.4* 11.2* 11.1*  HCT 39.7 43.9 38.8 35.8* 35.1* 34.5* 34.8*  MCV 86.7 90.3 88.4  --   --   --   --   PLT 200 228 159  --   --   --   --    Basic Metabolic Panel: Recent Labs  Lab 07/15/22 1501 07/18/22 0907 07/20/22 0359 07/21/22 0435  NA 139 133* 134* 137  K 4.3 4.9 3.7 3.4*  CL 101 101 102 107  CO2 21 16* 28 25  GLUCOSE 75 61* 99 105*  BUN 22 23 19 13   CREATININE 0.92 1.21* 1.06* 0.85  CALCIUM 9.2 9.4 9.1 8.3*   GFR: Estimated Creatinine Clearance: 53.7 mL/min (by C-G formula based on SCr of 0.85 mg/dL). Liver Function Tests: Recent Labs  Lab 07/15/22 1501 07/18/22 0907 07/20/22 0359 07/21/22 0435  AST 26 59* 52* 38  ALT 16 36 36 28  ALKPHOS  --  91 76 62  BILITOT 0.5 1.7* 1.0 0.8  PROT 7.1 7.5 6.7 5.5*  ALBUMIN  --  4.1 3.6 3.0*   Recent Labs  Lab 07/15/22 1501 07/18/22 0907  LIPASE 38 38   No results for input(s): "AMMONIA" in the last 168 hours. Coagulation Profile: Recent Labs  Lab 07/20/22 0722  INR 1.2   Cardiac Enzymes: No results for input(s): "CKTOTAL", "CKMB", "CKMBINDEX", "TROPONINI" in the last 168 hours. BNP (last 3 results) Recent Labs    08/02/21 1120  PROBNP 184.0*   HbA1C: No results for input(s): "HGBA1C" in the last 72 hours. CBG: Recent Labs  Lab 07/18/22 1218 07/18/22 1322 07/18/22 1436  GLUCAP 63* 66* 85   Lipid Profile: No results for input(s): "CHOL", "HDL", "LDLCALC", "TRIG", "CHOLHDL", "LDLDIRECT" in the last 72 hours. Thyroid Function Tests: No results for input(s): "TSH", "T4TOTAL", "FREET4", "T3FREE", "THYROIDAB" in the last 72 hours. Anemia Panel: No results for input(s): "VITAMINB12", "FOLATE", "FERRITIN", "TIBC", "IRON", "RETICCTPCT" in the last 72 hours. Sepsis Labs: No results for input(s): "PROCALCITON", "LATICACIDVEN" in the last 168 hours.  No results found for this or any previous visit (from the past 240 hour(s)).    Radiology Studies: CT ABDOMEN PELVIS W CONTRAST  Result Date: 07/20/2022 CLINICAL DATA:  Lower abdominal pain and melena EXAM: CT ABDOMEN AND PELVIS WITH CONTRAST TECHNIQUE: Multidetector CT imaging of the abdomen and pelvis was performed using the standard protocol following bolus administration of intravenous contrast. RADIATION DOSE REDUCTION: This exam was performed according to the departmental dose-optimization program which includes automated exposure control, adjustment of the mA and/or kV according to patient size and/or use of iterative reconstruction technique. CONTRAST:  OMNIPAQUE IOHEXOL 300 MG/ML  SOLN COMPARISON:  07/18/2022 FINDINGS: Lower chest: Extensive atheromatous calcification of the coronaries. Heavy calcification at the aortic valve. Hepatobiliary: No focal liver abnormality.Cholelithiasis and gallbladder sludge. No indication of acute cholecystitis or ductal obstruction. Pancreas:  No acute finding. Mild generalized atrophy for age. Spleen: Granulomatous type calcifications. No acute finding. Adrenals/Urinary Tract: Negative adrenals. No  hydronephrosis or stone. Unremarkable bladder. Stomach/Bowel: No obstruction. No visible bowel inflammation. Numerous left colonic diverticula. Vascular/Lymphatic: No acute vascular abnormality. Extensive atheromatous calcification of the aorta, iliacs, and proximal visceral branches. No mass or adenopathy. Reproductive:No pathologic findings. Other: No ascites or pneumoperitoneum. Musculoskeletal: No acute abnormalities. Generalized spinal degeneration and subjective osteopenia. IMPRESSION: 1. No acute finding.  No clear source of GI bleeding. 2. Atherosclerosis, cholelithiasis, colonic diverticulosis, and heavy aortic valve calcification. Electronically Signed   By: Tiburcio Pea M.D.   On: 07/20/2022 05:00    Scheduled Meds:  [START ON 07/23/2022] pantoprazole  40 mg Intravenous Q12H   Continuous Infusions:  sodium chloride 75 mL/hr at  07/21/22 0616   sodium chloride     pantoprazole 8 mg/hr (07/20/22 0807)     LOS: 1 day    Time spent: 50 mins.    Willeen Niece, MD [facility]   If 7PM-7AM, please contact night-coverage

## 2022-07-21 NOTE — Care Management Obs Status (Signed)
MEDICARE OBSERVATION STATUS NOTIFICATION   Patient Details  Name: [name] MRN: [id number] Date of Birth: [birth year]   Medicare Observation Status Notification Given:  Yes    Darleene Cleaver, LCSW 07/21/2022, 5:08 PM

## 2022-07-22 DIAGNOSIS — K922 Gastrointestinal hemorrhage, unspecified: Secondary | ICD-10-CM | POA: Diagnosis not present

## 2022-07-22 LAB — HEMOGLOBIN AND HEMATOCRIT, BLOOD
HCT: 32.8 % — ABNORMAL LOW (ref 36.0–46.0)
HCT: 36.3 % (ref 36.0–46.0)
Hemoglobin: 10.8 g/dL — ABNORMAL LOW (ref 12.0–15.0)
Hemoglobin: 11.9 g/dL — ABNORMAL LOW (ref 12.0–15.0)

## 2022-07-22 NOTE — Progress Notes (Signed)
Mobility Specialist - Progress Note   07/22/22 1018  Mobility  Activity Ambulated with assistance in hallway  Level of Assistance Standby assist, set-up cues, supervision of patient - no hands on  Assistive Device Front wheel walker  Distance Ambulated (ft) 320 ft  Activity Response Tolerated well  $Mobility charge 1 Mobility  Mobility Specialist Start Time (ACUTE ONLY) 0959  Mobility Specialist Stop Time (ACUTE ONLY) 1017  Mobility Specialist Time Calculation (min) (ACUTE ONLY) 18 min   Pt supine upon entry, utilizing RA. Pt agreeable to OOB amb in the hallway this date. Pt completed bed mob indep, STS to RW and amb with supervision. Pt amb two laps around the NS, requiring min Vcs for sequencing around the corners of the hallway-- tends to speed up around the corners causing some dizziness. Pt returned to the room, left seated in the recliner with needs within reach.   Zetta Bills Mobility Specialist 07/22/22 10:21 AM

## 2022-07-22 NOTE — Progress Notes (Signed)
DISCHARGE NOTE:   Pt discharged with personal belongings bag and IV removed. Pt wheeled down to medical mall entrance and pt's family provided transportation.

## 2022-07-22 NOTE — Discharge Instructions (Signed)
Advised to follow-up with primary care physician in 1 week. Advised to continue current medications as prescribed. Patient underwent EGD which shows gastritis otherwise unremarkable.

## 2022-07-22 NOTE — Plan of Care (Signed)

## 2022-07-22 NOTE — Discharge Summary (Signed)
Physician Discharge Summary  [name] WUJ:[id number] DOB: [birth year] DOA: 07/20/2022  PCP: Joaquim Nam, MD  Admit date: 07/20/2022  Discharge date: 07/22/2022  Admitted From: Home  Disposition:  Home  Recommendations for Outpatient Follow-up:  Follow up with PCP in 1-2 weeks. Please obtain BMP/CBC in one week. Advised to continue current medications as prescribed. Patient underwent EGD which shows gastritis otherwise unremarkable.  Home Health: None Equipment/Devices:None  Discharge Condition: Stable CODE STATUS:Full code Diet recommendation: Heart Healthy  Brief [facility] Course: This 87 years old female with PMH significant for coronary artery disease, s/p stenting, atrial fibrillation on Eliquis, GERD, hypertension presented in the ED with GI bleeding.  Patient reports longstanding history of intermittent melanotic and black stools for multiple years.  She did have colonoscopy few years back and was found to have polyps.  There was some concern for possible colon surgery which is relatively unclear.  Patient has worsening episodes over last 2 to 3 days.  She reports generalized abdominal pain associated with black stools. She was hemodynamically stable.  CT abdomen and pelvis no evidence of acute abnormalities.  No clear source of GI bleeding.  H&H remains stable around 12.7.  Patient is admitted for further evaluation.  GI was consulted.  Patient underwent EGD found to have gastritis otherwise unremarkable.  GI recommended no further intervention.  Patient felt better,  H&H remains stable next day.  Patient is being discharged home.  Discharge Diagnoses:  Principal Problem:   GIB (gastrointestinal bleeding) Active Problems:   Essential hypertension   CORONARY ARTERY DISEASE, S/P PTCA   Atrial fibrillation (HCC)  Acute on chronic GI bleeding: Patient reports having intermittent episodes of black stools for many years. She does have colonoscopy few years back,   found to have colonic polyps. She reports worsening bleeding over the last 3 to 4 days. Will hold Eliquis in the setting of GI bleed. CTA abdomen showed no evidence of GI bleeding. Continue IV pantoprazole 40 mg every 12 hours. Hemoglobin remains stable @ 12.7. Transfuse for hemoglobin below 7.0 GI is consulted. EGD found to have gastritis otherwise unremarkable. GI signed off.  Patient is being discharged home.   Atrial fibrillation: Heart rate is well-controlled. Eliquis resumed at discharge.   Coronary artery disease s/p PTCA Patient denies any chest pain.   Continue Crestor.   Essential hypertension: Blood pressure improved.  Blood pressure medications resumed on discharge.  Discharge Instructions  Discharge Instructions     Call MD for:  difficulty breathing, headache or visual disturbances   Complete by: As directed    Call MD for:  persistant dizziness or light-headedness   Complete by: As directed    Call MD for:  persistant nausea and vomiting   Complete by: As directed    Diet - low sodium heart healthy   Complete by: As directed    Diet Carb Modified   Complete by: As directed    Discharge instructions   Complete by: As directed    Advised to follow-up with primary care physician in 1 week. Advised to continue current medications as prescribed. Patient underwent EGD which shows gastritis otherwise unremarkable.   Increase activity slowly   Complete by: As directed       Allergies as of 07/22/2022       Reactions   Amoxicillin Swelling   Face hands   Penicillins Swelling, Other (See Comments)   Has patient had a PCN reaction causing immediate rash, facial/tongue/throat swelling, SOB or lightheadedness  with hypotension: Yes Has patient had a PCN reaction causing severe rash involving mucus membranes or skin necrosis: No Has patient had a PCN reaction that required hospitalization No phalosporin use.   Valium [diazepam] Other (See Comments)   Tremor.   Tolerates alprazolam.   Imdur [isosorbide Nitrate]    Headache at 60mg  dose        Medication List     STOP taking these medications    ciprofloxacin 500 MG tablet Commonly known as: Cipro   metroNIDAZOLE 500 MG tablet Commonly known as: FLAGYL       TAKE these medications    ALPRAZolam 0.5 MG tablet Commonly known as: XANAX TAKE ONE TABLET BY MOUTH TWICE DAILY FOR ANXIETY   atorvastatin 80 MG tablet Commonly known as: LIPITOR TAKE ONE TABLET BY MOUTH EVERY EVENING   cyanocobalamin 1000 MCG tablet Commonly known as: VITAMIN B12 Take 1,000 mcg by mouth daily.   diltiazem 180 MG 24 hr capsule Commonly known as: CARDIZEM CD TAKE ONE CAPSULE BY MOUTH ONCE DAILY   Eliquis 5 MG Tabs tablet Generic drug: apixaban TAKE ONE TABLET BY MOUTH TWICE DAILY   fluticasone 50 MCG/ACT nasal spray Commonly known as: FLONASE USE TWO SPRAYS in each nostril ONCE DAILY   gabapentin 100 MG capsule Commonly known as: Neurontin Take 1 capsule (100 mg total) by mouth 3 (three) times daily.   lansoprazole 30 MG capsule Commonly known as: PREVACID TAKE ONE CAPSULE BY MOUTH TWICE DAILY   lisinopril 5 MG tablet Commonly known as: ZESTRIL 1 tab a day if BP is above 140/90.   ondansetron 4 MG disintegrating tablet Commonly known as: ZOFRAN-ODT Take 1 tablet (4 mg total) by mouth every 8 (eight) hours as needed for nausea or vomiting.   predniSONE 20 MG tablet Commonly known as: DELTASONE Take 20 mg by mouth daily with breakfast.   sertraline 100 MG tablet Commonly known as: ZOLOFT TAKE 1 AND 1/2 TABLETS BY MOUTH ONCE DAILY   Vitamin D 50 MCG (2000 UT) Caps Take 2,000 Units by mouth daily.        Follow-up Information     Joaquim Nam, MD Follow up in 1 week(s).   Specialty: Family Medicine Contact information: [address] [place] [address] [phone]                Allergies  Allergen Reactions   Amoxicillin Swelling    Face hands    Penicillins Swelling and Other (See Comments)    Has patient had a PCN reaction causing immediate rash, facial/tongue/throat swelling, SOB or lightheadedness with hypotension: Yes Has patient had a PCN reaction causing severe rash involving mucus membranes or skin necrosis: No Has patient had a PCN reaction that required hospitalization No phalosporin use.   Valium [Diazepam] Other (See Comments)    Tremor.  Tolerates alprazolam.   Imdur [Isosorbide Nitrate]     Headache at 60mg  dose    Consultations: Gastroenterology   Procedures/Studies: CT ABDOMEN PELVIS W CONTRAST  Result Date: 07/20/2022 CLINICAL DATA:  Lower abdominal pain and melena EXAM: CT ABDOMEN AND PELVIS WITH CONTRAST TECHNIQUE: Multidetector CT imaging of the abdomen and pelvis was performed using the standard protocol following bolus administration of intravenous contrast. RADIATION DOSE REDUCTION: This exam was performed according to the departmental dose-optimization program which includes automated exposure control, adjustment of the mA and/or kV according to patient size and/or use of iterative reconstruction technique. CONTRAST:  OMNIPAQUE IOHEXOL 300 MG/ML  SOLN COMPARISON:  07/18/2022 FINDINGS: Lower chest: Extensive atheromatous calcification of the coronaries. Heavy calcification at the aortic valve. Hepatobiliary: No focal liver abnormality.Cholelithiasis and gallbladder sludge. No indication of acute cholecystitis or ductal obstruction. Pancreas:  No acute finding. Mild generalized atrophy for age. Spleen: Granulomatous type calcifications. No acute finding. Adrenals/Urinary Tract: Negative adrenals. No hydronephrosis or stone. Unremarkable bladder. Stomach/Bowel: No obstruction. No visible bowel inflammation. Numerous left colonic diverticula. Vascular/Lymphatic: No acute vascular abnormality. Extensive atheromatous calcification of the aorta, iliacs, and proximal visceral branches. No mass or adenopathy.  Reproductive:No pathologic findings. Other: No ascites or pneumoperitoneum. Musculoskeletal: No acute abnormalities. Generalized spinal degeneration and subjective osteopenia. IMPRESSION: 1. No acute finding.  No clear source of GI bleeding. 2. Atherosclerosis, cholelithiasis, colonic diverticulosis, and heavy aortic valve calcification. Electronically Signed   By: Tiburcio Pea M.D.   On: 07/20/2022 05:00   CT ABDOMEN PELVIS W CONTRAST  Result Date: 07/18/2022 CLINICAL DATA:  Acute generalized abdominal pain. EXAM: CT ABDOMEN AND PELVIS WITH CONTRAST TECHNIQUE: Multidetector CT imaging of the abdomen and pelvis was performed using the standard protocol following bolus administration of intravenous contrast. RADIATION DOSE REDUCTION: This exam was performed according to the departmental dose-optimization program which includes automated exposure control, adjustment of the mA and/or kV according to patient size and/or use of iterative reconstruction technique. CONTRAST:  75mL OMNIPAQUE IOHEXOL 300 MG/ML  SOLN COMPARISON:  May 28, 2022. FINDINGS: Lower chest: No acute abnormality. Hepatobiliary: Mild cholelithiasis. No biliary dilatation is noted. Liver is unremarkable. Pancreas: Unremarkable. No pancreatic ductal dilatation or surrounding inflammatory changes. Spleen: Multiple small calcified splenic granulomas. Adrenals/Urinary Tract: Adrenal glands are unremarkable. Kidneys are normal, without renal calculi, focal lesion, or hydronephrosis. Bladder is unremarkable. Stomach/Bowel: The stomach is unremarkable. Status post appendectomy. There is no evidence of bowel obstruction or inflammation. Vascular/Lymphatic: Aortic atherosclerosis. No enlarged abdominal or pelvic lymph nodes. Reproductive: Uterus and bilateral adnexa are unremarkable. Other: No abdominal wall hernia or abnormality. No abdominopelvic ascites. Musculoskeletal: No acute or significant osseous findings. IMPRESSION: Cholelithiasis. No acute  abnormality seen in the abdomen or pelvis. Aortic Atherosclerosis (ICD10-I70.0). Electronically Signed   By: Lupita Raider M.D.   On: 07/18/2022 14:27   US Venous Img Lower Bilateral (DVT)  Result Date: 07/08/2022 CLINICAL DATA:  Pain and swelling EXAM: BILATERAL LOWER EXTREMITY VENOUS DOPPLER ULTRASOUND TECHNIQUE: Gray-scale sonography with graded compression, as well as color Doppler and duplex ultrasound were performed to evaluate the lower extremity deep venous systems from the level of the common femoral vein and including the common femoral, femoral, profunda femoral, popliteal and calf veins including the posterior tibial, peroneal and gastrocnemius veins when visible. The superficial great saphenous vein was also interrogated. Spectral Doppler was utilized to evaluate flow at rest and with distal augmentation maneuvers in the common femoral, femoral and popliteal veins. COMPARISON:  None Available. FINDINGS: RIGHT LOWER EXTREMITY Common Femoral Vein: No evidence of thrombus. Normal compressibility, respiratory phasicity and response to augmentation. Saphenofemoral Junction: No evidence of thrombus. Normal compressibility and flow on color Doppler imaging. Profunda Femoral Vein: No evidence of thrombus. Normal compressibility and flow on color Doppler imaging. Femoral Vein: No evidence of thrombus. Normal compressibility, respiratory phasicity and response to augmentation. Popliteal Vein: No evidence of thrombus. Normal compressibility, respiratory phasicity and response to augmentation. Calf Veins: No evidence of thrombus. Normal compressibility and flow on color Doppler imaging. Superficial Great Saphenous Vein: No evidence of thrombus. Normal compressibility. Venous Reflux:  None. Other Findings:  None. LEFT LOWER EXTREMITY Common Femoral Vein: No  evidence of thrombus. Normal compressibility, respiratory phasicity and response to augmentation. Saphenofemoral Junction: No evidence of thrombus. Normal  compressibility and flow on color Doppler imaging. Profunda Femoral Vein: No evidence of thrombus. Normal compressibility and flow on color Doppler imaging. Femoral Vein: No evidence of thrombus. Normal compressibility, respiratory phasicity and response to augmentation. Popliteal Vein: No evidence of thrombus. Normal compressibility, respiratory phasicity and response to augmentation. Calf Veins: No evidence of thrombus. Normal compressibility and flow on color Doppler imaging. Superficial Great Saphenous Vein: No evidence of thrombus. Normal compressibility. Venous Reflux:  None. Other Findings:  None. IMPRESSION: No evidence of deep venous thrombosis in either lower extremity. Electronically Signed   By: Ernie Avena M.D.   On: 07/08/2022 15:59    EGD.   Subjective: Patient was seen and examined at bedside.  Overnight events noted.   Patient report doing much better and wants to be discharged. H&H remains stable.  She did not have further bleeding.  Discharge Exam: Vitals:   07/21/22 2356 07/22/22 0833  BP: (!) 142/76 (!) 191/79  Pulse: 63 70  Resp: 18 16  Temp: 98.3 F (36.8 C) 98.1 F (36.7 C)  SpO2: 100% 99%   Vitals:   07/21/22 1241 07/21/22 1616 07/21/22 2356 07/22/22 0833  BP: (!) 146/73 130/65 (!) 142/76 (!) 191/79  Pulse: 64 88 63 70  Resp: 17 17 18 16   Temp: 98.9 F (37.2 C) 98.1 F (36.7 C) 98.3 F (36.8 C) 98.1 F (36.7 C)  TempSrc: Oral     SpO2: 96% 95% 100% 99%  Weight:      Height:        General: Pt is alert, awake, not in acute distress Cardiovascular: RRR, S1/S2 +, no rubs, no gallops Respiratory: CTA bilaterally, no wheezing, no rhonchi Abdominal: Soft, NT, ND, bowel sounds + Extremities: no edema, no cyanosis    The results of significant diagnostics from this hospitalization (including imaging, microbiology, ancillary and laboratory) are listed below for reference.     Microbiology: No results found for this or any previous visit (from  the past 240 hour(s)).   Labs: BNP (last 3 results) No results for input(s): "BNP" in the last 8760 hours. Basic Metabolic Panel: Recent Labs  Lab 07/15/22 1501 07/18/22 0907 07/20/22 0359 07/21/22 0435  NA 139 133* 134* 137  K 4.3 4.9 3.7 3.4*  CL 101 101 102 107  CO2 21 16* 28 25  GLUCOSE 75 61* 99 105*  BUN 22 23 19 13   CREATININE 0.92 1.21* 1.06* 0.85  CALCIUM 9.2 9.4 9.1 8.3*   Liver Function Tests: Recent Labs  Lab 07/15/22 1501 07/18/22 0907 07/20/22 0359 07/21/22 0435  AST 26 59* 52* 38  ALT 16 36 36 28  ALKPHOS  --  91 76 62  BILITOT 0.5 1.7* 1.0 0.8  PROT 7.1 7.5 6.7 5.5*  ALBUMIN  --  4.1 3.6 3.0*   Recent Labs  Lab 07/15/22 1501 07/18/22 0907  LIPASE 38 38   No results for input(s): "AMMONIA" in the last 168 hours. CBC: Recent Labs  Lab 07/15/22 1501 07/18/22 0906 07/20/22 0359 07/20/22 1005 07/21/22 1048 07/21/22 1555 07/21/22 2203 07/22/22 0349 07/22/22 0937  WBC 6.9 6.9 6.1  --   --   --   --   --   --   NEUTROABS 4,492  --  4.2  --   --   --   --   --   --   HGB 13.1 13.7 12.5   < >  11.7* 11.8* 11.2* 10.8* 11.9*  HCT 39.7 43.9 38.8   < > 36.4 36.9 34.1* 32.8* 36.3  MCV 86.7 90.3 88.4  --   --   --   --   --   --   PLT 200 228 159  --   --   --   --   --   --    < > = values in this interval not displayed.   Cardiac Enzymes: No results for input(s): "CKTOTAL", "CKMB", "CKMBINDEX", "TROPONINI" in the last 168 hours. BNP: Invalid input(s): "POCBNP" CBG: Recent Labs  Lab 07/18/22 1218 07/18/22 1322 07/18/22 1436  GLUCAP 63* 66* 85   D-Dimer No results for input(s): "DDIMER" in the last 72 hours. Hgb A1c No results for input(s): "HGBA1C" in the last 72 hours. Lipid Profile No results for input(s): "CHOL", "HDL", "LDLCALC", "TRIG", "CHOLHDL", "LDLDIRECT" in the last 72 hours. Thyroid function studies No results for input(s): "TSH", "T4TOTAL", "T3FREE", "THYROIDAB" in the last 72 hours.  Invalid input(s): "FREET3" Anemia  work up No results for input(s): "VITAMINB12", "FOLATE", "FERRITIN", "TIBC", "IRON", "RETICCTPCT" in the last 72 hours. Urinalysis    Component Value Date/Time   COLORURINE YELLOW (A) 07/20/2022 0400   APPEARANCEUR CLEAR (A) 07/20/2022 0400   LABSPEC 1.044 (H) 07/20/2022 0400   PHURINE 6.0 07/20/2022 0400   GLUCOSEU NEGATIVE 07/20/2022 0400   HGBUR NEGATIVE 07/20/2022 0400   HGBUR negative 05/10/2010 1127   BILIRUBINUR NEGATIVE 07/20/2022 0400   BILIRUBINUR Neg 04/12/2019 1530   KETONESUR 20 (A) 07/20/2022 0400   PROTEINUR NEGATIVE 07/20/2022 0400   UROBILINOGEN 0.2 04/12/2019 1530   UROBILINOGEN 1.0 12/23/2011 1018   NITRITE NEGATIVE 07/20/2022 0400   LEUKOCYTESUR TRACE (A) 07/20/2022 0400   Sepsis Labs Recent Labs  Lab 07/15/22 1501 07/18/22 0906 07/20/22 0359  WBC 6.9 6.9 6.1   Microbiology No results found for this or any previous visit (from the past 240 hour(s)).   Time coordinating discharge: Over 30 minutes  SIGNED:   Willeen Niece, MD  [facility] 07/22/2022, 10:25 AM Pager   If 7PM-7AM, please contact night-coverage

## 2022-07-22 NOTE — Anesthesia Postprocedure Evaluation (Signed)
Anesthesia Post Note  Patient: [name]  Procedure(s) Performed: ESOPHAGOGASTRODUODENOSCOPY (EGD) WITH PROPOFOL  Patient location during evaluation: PACU Anesthesia Type: General Level of consciousness: awake and alert Pain management: pain level controlled Vital Signs Assessment: post-procedure vital signs reviewed and stable Respiratory status: spontaneous breathing, nonlabored ventilation and respiratory function stable Cardiovascular status: blood pressure returned to baseline and stable Postop Assessment: no apparent nausea or vomiting Anesthetic complications: no   No notable events documented.   Last Vitals:  Vitals:   07/21/22 1616 07/21/22 2356  BP: 130/65 (!) 142/76  Pulse: 88 63  Resp: 17 18  Temp: 36.7 C 36.8 C  SpO2: 95% 100%    Last Pain:  Vitals:   07/21/22 1241  TempSrc: Oral  PainSc:                  Foye Deer

## 2022-07-25 ENCOUNTER — Telehealth: Payer: Self-pay | Admitting: Family Medicine

## 2022-07-25 NOTE — Telephone Encounter (Signed)
Prescription Request  07/25/2022  LOV: 07/15/2022  What is the name of the medication or equipment? gabapentin (NEURONTIN) 100 MG capsule   Have you contacted your pharmacy to request a refill? No   Which pharmacy would you like this sent to?  [facility] - [place], [place] - [address] [place] [address] Phone: [phone] Fax: [phone]    Patient notified that their request is being sent to the clinical staff for review and that they should receive a response within 2 business days.   Please advise at [facility] [phone]

## 2022-07-25 NOTE — Telephone Encounter (Signed)
LOV - 07/15/22 NOV - 07/29/22 RF - 06/02/22 #90/3

## 2022-07-26 MED ORDER — GABAPENTIN 100 MG PO CAPS: 100.0000 mg | ORAL_CAPSULE | Freq: Three times a day (TID) | ORAL | 3 refills | Status: DC

## 2022-07-26 NOTE — Telephone Encounter (Signed)
Sent. Thanks.   

## 2022-07-26 NOTE — Addendum Note (Signed)
Addended by: Joaquim Nam on: 07/26/2022 07:52 AM   Modules accepted: Orders

## 2022-07-29 ENCOUNTER — Encounter: Payer: Self-pay | Admitting: Family Medicine

## 2022-07-29 ENCOUNTER — Ambulatory Visit (INDEPENDENT_AMBULATORY_CARE_PROVIDER_SITE_OTHER): Payer: 59 | Admitting: Family Medicine

## 2022-07-29 VITALS — BP 122/62 | HR 68 | Temp 96.9°F | Ht 66.0 in | Wt 183.0 lb

## 2022-07-29 DIAGNOSIS — K922 Gastrointestinal hemorrhage, unspecified: Secondary | ICD-10-CM | POA: Diagnosis not present

## 2022-07-29 DIAGNOSIS — M545 Low back pain, unspecified: Secondary | ICD-10-CM

## 2022-07-29 DIAGNOSIS — R109 Unspecified abdominal pain: Secondary | ICD-10-CM | POA: Diagnosis not present

## 2022-07-29 DIAGNOSIS — I10 Essential (primary) hypertension: Secondary | ICD-10-CM | POA: Diagnosis not present

## 2022-07-29 LAB — CBC WITH DIFFERENTIAL/PLATELET
Basophils Absolute: 0 10*3/uL (ref 0.0–0.1)
Basophils Relative: 0.3 % (ref 0.0–3.0)
Eosinophils Absolute: 0 10*3/uL (ref 0.0–0.7)
Eosinophils Relative: 0.5 % (ref 0.0–5.0)
HCT: 37.6 % (ref 36.0–46.0)
Hemoglobin: 12.4 g/dL (ref 12.0–15.0)
Lymphocytes Relative: 10.7 % — ABNORMAL LOW (ref 12.0–46.0)
Lymphs Abs: 0.9 10*3/uL (ref 0.7–4.0)
MCHC: 33.1 g/dL (ref 30.0–36.0)
MCV: 87.2 fl (ref 78.0–100.0)
Monocytes Absolute: 0.6 10*3/uL (ref 0.1–1.0)
Monocytes Relative: 7.7 % (ref 3.0–12.0)
Neutro Abs: 6.8 10*3/uL (ref 1.4–7.7)
Neutrophils Relative %: 80.8 % — ABNORMAL HIGH (ref 43.0–77.0)
Platelets: 174 10*3/uL (ref 150.0–400.0)
RBC: 4.31 Mil/uL (ref 3.87–5.11)
RDW: 15.5 % (ref 11.5–15.5)
WBC: 8.4 10*3/uL (ref 4.0–10.5)

## 2022-07-29 LAB — BASIC METABOLIC PANEL
BUN: 9 mg/dL (ref 6–23)
CO2: 31 mEq/L (ref 19–32)
Calcium: 8.9 mg/dL (ref 8.4–10.5)
Chloride: 101 mEq/L (ref 96–112)
Creatinine, Ser: 0.7 mg/dL (ref 0.40–1.20)
GFR: 79.34 mL/min (ref >60.00)
Glucose, Bld: 87 mg/dL (ref 70–99)
Potassium: 3.3 mEq/L — ABNORMAL LOW (ref 3.5–5.1)
Sodium: 138 mEq/L (ref 135–145)

## 2022-07-29 NOTE — Patient Instructions (Addendum)
Go to the lab on the way out.   If you have mychart we'll likely use that to update you.    Take care.  Glad to see you. Keep taking prevacid twice a day.   Let me know if you more black stools.    Let me know if you can't get gabapentin filled.

## 2022-07-29 NOTE — Progress Notes (Unsigned)
Recommendations for Outpatient Follow-up:  Follow up with PCP in 1-2 weeks. Please obtain BMP/CBC in one week. Advised to continue current medications as prescribed. Patient underwent EGD which shows gastritis otherwise unremarkable.   Home Health: None Equipment/Devices:None   Discharge Condition: Stable CODE STATUS:Full code Diet recommendation: Heart Healthy   Brief [facility] Course: This 87 years old female with PMH significant for coronary artery disease, s/p stenting, atrial fibrillation on Eliquis, GERD, hypertension presented in the ED with GI bleeding.  Patient reports longstanding history of intermittent melanotic and black stools for multiple years.  She did have colonoscopy few years back and was found to have polyps.  There was some concern for possible colon surgery which is relatively unclear.  Patient has worsening episodes over last 2 to 3 days.  She reports generalized abdominal pain associated with black stools. She was hemodynamically stable.  CT abdomen and pelvis no evidence of acute abnormalities.  No clear source of GI bleeding.  H&H remains stable around 12.7.  Patient is admitted for further evaluation.  GI was consulted.  Patient underwent EGD found to have gastritis otherwise unremarkable.  GI recommended no further intervention.  Patient felt better,  H&H remains stable next day.  Patient is being discharged home.   Discharge Diagnoses:  Principal Problem:   GIB (gastrointestinal bleeding) Active Problems:   Essential hypertension   CORONARY ARTERY DISEASE, S/P PTCA   Atrial fibrillation (HCC)   Acute on chronic GI bleeding: Patient reports having intermittent episodes of black stools for many years. She does have colonoscopy few years back,  found to have colonic polyps. She reports worsening bleeding over the last 3 to 4 days. Will hold Eliquis in the setting of GI bleed. CTA abdomen showed no evidence of GI bleeding. Continue IV pantoprazole 40 mg  every 12 hours. Hemoglobin remains stable @ 12.7. Transfuse for hemoglobin below 7.0 GI is consulted. EGD found to have gastritis otherwise unremarkable. GI signed off.  Patient is being discharged home.   Atrial fibrillation: Heart rate is well-controlled. Eliquis resumed at discharge.   Coronary artery disease s/p PTCA Patient denies any chest pain.   Continue Crestor.   Essential hypertension: Blood pressure improved.  Blood pressure medications resumed on discharge. ================================== Inpatient course discussed with patient.  See above.  Back pain is better with at rest.  She has pain walking but clearly less with using a cane.   She is off prednisone and antibiotics.  She hasn't had gabapentin filled yet.  She used some of her old rx with relief.   Still on prevacid.  Back on eliquis in the meantime.  No more black stools.  Brown stools now.  No bleeding known since hospitalization.  "I'm 100% better."  She clearly looks like she feels better.  She reported to me that she was hallucinating in the hospital in the setting of sleep deprivation.  It resolved in the meantime.    She is back on her baseline blood pressure medications and not lightheaded.  Not having chest pain.  Meds, vitals, and allergies reviewed.   ROS: Per HPI unless specifically indicated in ROS section   Nad [place] Neck supple, no LA ctab Occ ectopy noted.  SEM noted.  Abdomen soft.  Nontender. Trace BLE edema.  Skin well-perfused. Smiling.

## 2022-07-31 NOTE — Assessment & Plan Note (Signed)
She is not lightheaded and has restarted diltiazem and lisinopril in the meantime.  I would continue as is.

## 2022-07-31 NOTE — Assessment & Plan Note (Addendum)
See notes on labs.   Keep taking prevacid twice a day.   Let me know if any more black stools.   Rationale for care discussed with patient.  She agrees to plan.  Her mentation is normal today and she is alert and oriented.  She is pleasant conversation and she is not hallucinating.  This appears to be likely multifactorial and exacerbated by illness and hospitalization.

## 2022-07-31 NOTE — Assessment & Plan Note (Signed)
Not on prednisone currently.  Using gabapentin as needed.  It helped with pain.  Continue as is.  She can let me know if she can't get gabapentin filled.  Okay for outpatient follow-up.  She agrees to plan.

## 2022-08-02 ENCOUNTER — Telehealth: Payer: Self-pay

## 2022-08-02 NOTE — Patient Instructions (Signed)
Visit Information  Thank you for taking time to visit with me today. Please don't hesitate to contact me if I can be of assistance to you.   Following are the goals we discussed today:  Continue to monitor blood pressure and record.   Take medications as prescribed.  Notify provider for any new or ongoing gastrointestinal symptoms.   Our next appointment is by telephone on 09/02/22 at 11 am  Please call the care guide team at [phone] if you need to cancel or reschedule your appointment.   If you are experiencing a Mental Health or Behavioral Health Crisis or need someone to talk to, please call the Suicide and Crisis Lifeline: 988 call 1-800-273-TALK (toll free, 24 hour hotline)  The patient verbalized understanding of instructions, educational materials, and care plan provided today and agreed to receive a mailed copy of patient instructions, educational materials, and care plan.   [name] Ina RN,BSN,CCM [facility] Care Coordination [phone] direct line

## 2022-08-02 NOTE — Patient Outreach (Signed)
  Care Coordination   Follow Up Visit Note   08/02/2022 Name: [name] MRN: [id number] DOB: 1936/01/18  [name] is a 87 y.o. year old female who sees Joaquim Nam, MD for primary care. I spoke with  [name] by phone today.  What matters to the patients health and wellness today?  Patient states she is doing well.  Denies symptoms of Nausea/ vomiting/ abdominal pain or diarrhea.  She states this has resolved.  Patient states she has a new BP monitor. Reports BP today was 129/67.  She states she has not been recording blood pressure readings.  Patient reports she is eating well, mainly vegetables no meat without issues. Patient states she lives alone and has family and church members that check on her.  Patient reports having transportation assistance for provider appointments.    Goals Addressed             This Visit's Progress    Patient Stated: " Managing my health"       Interventions Today    Flowsheet Row Most Recent Value  Chronic Disease   Chronic disease during today's visit Hypertension (HTN)  [status post gastritis]  General Interventions   General Interventions Discussed/Reviewed General Interventions Reviewed, Doctor Visits  [evaluation of current treatment plan for HTN / gastritis and patients adherence to plan as established by provider. Assessed for sign/symptoms of gastritis. Assessed blood pressure readings.]  Doctor Visits Discussed/Reviewed Doctor Visits Reviewed  Annabell Sabal upcoming provider visits and confirmed patient has transportation.]  Education Interventions   Education Provided --  [Advised to notify provider for ongoing gastritis symptoms. Advised to continue to monitor blood pressures and write them down. Confirmed patient has help at home when needed.]  Nutrition Interventions   Nutrition Discussed/Reviewed Nutrition Reviewed  [Assessed appetite since having gastritits]  Pharmacy Interventions   Pharmacy Dicussed/Reviewed Pharmacy  Topics Reviewed  [medications dfiscussed and compliance encouraged.]               SDOH assessments and interventions completed:  No     Care Coordination Interventions:  Yes, provided   Follow up plan: Follow up call scheduled for 09/02/22    Encounter Outcome:  Pt. Visit Completed   [name] Ina RN,BSN,CCM [facility] Care Coordination [phone] direct line

## 2022-08-05 ENCOUNTER — Other Ambulatory Visit: Payer: Self-pay

## 2022-08-05 ENCOUNTER — Other Ambulatory Visit: Payer: Self-pay | Admitting: Family Medicine

## 2022-08-05 NOTE — Telephone Encounter (Signed)
Refill request for Alprazolam 0.5 mg tabs  LOV - 07/29/22 Next OV - not scheduled Last refill -  06/01/22 #60/1

## 2022-08-08 MED ORDER — ALPRAZOLAM 0.5 MG PO TABS: ORAL_TABLET | ORAL | 1 refills | Status: DC

## 2022-08-10 ENCOUNTER — Telehealth: Payer: Self-pay

## 2022-08-10 ENCOUNTER — Other Ambulatory Visit (INDEPENDENT_AMBULATORY_CARE_PROVIDER_SITE_OTHER): Payer: Self-pay | Admitting: Nurse Practitioner

## 2022-08-10 DIAGNOSIS — I739 Peripheral vascular disease, unspecified: Secondary | ICD-10-CM

## 2022-08-10 NOTE — Progress Notes (Signed)
Care Management & Coordination Services Pharmacy Team  Reason for Encounter: Appointment Reminder  Contacted patient to confirm telephone appointment with Al Corpus, PharmD on 08/16/2022 at 3:45.  Spoke with patient on 08/11/2022   Do you have any problems getting your medications? No  What is your top health concern you would like to discuss at your upcoming visit? Stated no health concerns.  Have you seen any other providers since your last visit with PCP? No  Star Rating Drugs:  Medication:  Last Fill: Day Supply Atorvastatin 80 mg 08/09/2022 30  Care Gaps: Annual wellness visit in last year? Yes 11/10/2021  If Diabetic: Last eye exam / retinopathy screening: Up to date Last diabetic foot exam: Up to date  Al Corpus, PharmD notified  Claudina Lick, [place] Clinical Pharmacy Assistant [phone]

## 2022-08-12 ENCOUNTER — Encounter (INDEPENDENT_AMBULATORY_CARE_PROVIDER_SITE_OTHER): Payer: 59 | Admitting: Vascular Surgery

## 2022-08-12 ENCOUNTER — Encounter (INDEPENDENT_AMBULATORY_CARE_PROVIDER_SITE_OTHER): Payer: Self-pay

## 2022-08-16 ENCOUNTER — Encounter (INDEPENDENT_AMBULATORY_CARE_PROVIDER_SITE_OTHER): Payer: 59 | Admitting: Vascular Surgery

## 2022-08-16 ENCOUNTER — Telehealth: Payer: Self-pay | Admitting: Pharmacist

## 2022-08-16 ENCOUNTER — Encounter: Payer: 59 | Admitting: Pharmacist

## 2022-08-16 NOTE — Progress Notes (Incomplete)
Care Management & Coordination Services Pharmacy Note  08/16/2022 Name:  [name] MRN:  [id number] DOB:  1935/07/06  Summary: F/U visit -HLD/CAD: LDL was 112 (10/2020), above goal of < 70 given CAD; however per refill dates, pt was likely not compliant with atorvastatin for at least a month prior to that [facility] she is now compliant and due for repeat lipid panel -HTN: BP at home 113/62, 125/70; pt is using lisinopril PRN for SBP > 140 and denies issues with this -GI: pt reports vast improvement in abdominal pain issues since changing to high fiber diet   Recommendations/Changes made from today's visit: -No med changes; advised pt to contact PCP with any very high BP (>180/100)  Follow up plan: -CC RN 09/02/22 -Pharmacist follow up televisit scheduled for *** -Vascular 08/23/22   Subjective: [name] is an 87 y.o. year old female who is a primary patient of Para March, Dwana Curd, MD.  The care coordination team was consulted for assistance with disease management and care coordination needs.    Engaged with patient by telephone for follow up visit.  Recent office visits: 07/29/22 Dr Para March OV: hospital f/u - K mildly low - will mail high K food handout.  06/02/22 Dr Para March OV: hospital f/u - abd pain improved w/ gabapentin. Resume lisinopril 5mg  PRN for BP > 140/90.  Recent consult visits: 07/07/22 NP Ann Maki (Cardiology): refer to vascular for PAD w/ claudication. No changes, recheck lipids at next visit.   Hospital visits: 07/20/22 - 07/22/22 Admission [facility]): GI bleeding - held Eliquis, IV protonix. EGD w/ gastritis. Eliquis resumed at discharge.   Objective:  Lab Results  Component Value Date   CREATININE 0.70 07/29/2022   BUN 9 07/29/2022   GFR 79.34 07/29/2022   GFRNONAA >60 07/21/2022   GFRAA >60 11/21/2019   NA 138 07/29/2022   K 3.3 (L) 07/29/2022   CALCIUM 8.9 07/29/2022   CO2 31 07/29/2022   GLUCOSE 87 07/29/2022    Lab Results  Component Value Date/Time    HGBA1C 5.9 10/17/2011 11:51 AM   GFR 79.34 07/29/2022 10:25 AM   GFR 58.99 (L) 11/03/2021 10:53 AM   MICROALBUR 0.3 06/22/2009 08:35 AM   MICROALBUR 1.2 09/18/2008 09:48 AM    Last diabetic Eye exam: No results found for: "HMDIABEYEEXA"  Last diabetic Foot exam: No results found for: "HMDIABFOOTEX"   Lab Results  Component Value Date   CHOL 180 11/09/2020   HDL 52.30 11/09/2020   LDLCALC 112 (H) 11/09/2020   LDLDIRECT 94.7 01/30/2014   TRIG 78.0 11/09/2020   CHOLHDL 3 11/09/2020       Latest Ref Rng & Units 07/21/2022    4:35 AM 07/20/2022    3:59 AM 07/18/2022    9:07 AM  Hepatic Function  Total Protein 6.5 - 8.1 g/dL 5.5  6.7  7.5   Albumin 3.5 - 5.0 g/dL 3.0  3.6  4.1   AST 15 - 41 U/L 38  52  59   ALT 0 - 44 U/L 28  36  36   Alk Phosphatase 38 - 126 U/L 62  76  91   Total Bilirubin 0.3 - 1.2 mg/dL 0.8  1.0  1.7     Lab Results  Component Value Date/Time   TSH 0.84 08/02/2021 11:20 AM   TSH 1.62 11/09/2020 01:00 PM       Latest Ref Rng & Units 07/29/2022   10:25 AM 07/22/2022    9:37 AM 07/22/2022  3:49 AM  CBC  WBC 4.0 - 10.5 K/uL 8.4     Hemoglobin 12.0 - 15.0 g/dL 40.9  81.1  91.4   Hematocrit 36.0 - 46.0 % 37.6  36.3  32.8   Platelets 150.0 - 400.0 K/uL 174.0       Lab Results  Component Value Date/Time   VD25OH 45.28 09/06/2018 11:10 AM   VD25OH 42.92 09/01/2016 12:47 PM   VITAMINB12 722 12/21/2021 09:29 AM   VITAMINB12 154 (L) 10/05/2006 09:37 AM    Clinical ASCVD: Yes  The ASCVD Risk score (Arnett DK, et al., 2019) failed to calculate for the following reasons:   The 2019 ASCVD risk score is only valid for ages 28 to 20    CHA2DS2/VAS Stroke Risk Points     7 >= 2 Points: High Risk           07/15/2022    2:12 PM 06/02/2022   11:15 AM 04/14/2022    2:20 PM  Depression screen PHQ 2/9  Decreased Interest 0 1 0  Down, Depressed, Hopeless 1 1 0  PHQ - 2 Score 1 2 0  Altered sleeping 0 0 0  Tired, decreased energy 1 0 0  Change in appetite 3 1  0  Feeling bad or failure about yourself  0 0 0  Trouble concentrating 0 0 0  Moving slowly or fidgety/restless 0 0 0  Suicidal thoughts 0 0 0  PHQ-9 Score 5 3 0  Difficult doing work/chores Somewhat difficult Not difficult at all Not difficult at all     Social History   Tobacco Use  Smoking Status Never  Smokeless Tobacco Never   BP Readings from Last 3 Encounters:  07/29/22 122/62  07/22/22 (!) 191/79  07/18/22 118/78   Pulse Readings from Last 3 Encounters:  07/29/22 68  07/22/22 70  07/18/22 (!) 114   Wt Readings from Last 3 Encounters:  07/29/22 183 lb (83 kg)  07/20/22 184 lb 1.4 oz (83.5 kg)  07/15/22 184 lb (83.5 kg)   BMI Readings from Last 3 Encounters:  07/29/22 29.54 kg/m  07/20/22 29.71 kg/m  07/15/22 29.70 kg/m    Allergies  Allergen Reactions   Amoxicillin Swelling    Face hands   Penicillins Swelling and Other (See Comments)    Has patient had a PCN reaction causing immediate rash, facial/tongue/throat swelling, SOB or lightheadedness with hypotension: Yes Has patient had a PCN reaction causing severe rash involving mucus membranes or skin necrosis: No Has patient had a PCN reaction that required hospitalization No phalosporin use.   Valium [Diazepam] Other (See Comments)    Tremor.  Tolerates alprazolam.   Imdur [Isosorbide Nitrate]     Headache at 60mg  dose    Medications Reviewed Today     Reviewed by Otho Ket, RN (Registered Nurse) on 08/02/22 at 1249  Med List Status: <None>   Medication Order Taking? Sig Documenting Provider Last Dose Status Informant  ALPRAZolam (XANAX) 0.5 MG tablet [id number] No TAKE ONE TABLET BY MOUTH TWICE DAILY FOR ANXIETY Joaquim Nam, MD Taking Active Self, Other, Pharmacy Records, Multiple Informants  atorvastatin (LIPITOR) 80 MG tablet [id number] No TAKE ONE TABLET BY MOUTH EVERY EVENING Joaquim Nam, MD Taking Active Self, Other, Pharmacy Records, Multiple Informants  Cholecalciferol (VITAMIN  D) 2000 units CAPS [id number] No Take 2,000 Units by mouth daily. [provider] Taking Active Self, Other, Pharmacy Records, Multiple Informants  diltiazem (CARDIZEM CD) 180 MG 24 hr capsule [id number] No  TAKE ONE CAPSULE BY MOUTH ONCE DAILY Joaquim Nam, MD Taking Active Self, Other, Pharmacy Records, Multiple Informants  ELIQUIS 5 MG TABS tablet [id number] No TAKE ONE TABLET BY MOUTH TWICE DAILY Joaquim Nam, MD Taking Active Self, Other, Pharmacy Records, Multiple Informants  fluticasone [facility]) 50 MCG/ACT nasal spray [id number] No USE TWO SPRAYS in each nostril ONCE DAILY Joaquim Nam, MD Taking Active Self, Other, Pharmacy Records, Multiple Informants  gabapentin (NEURONTIN) 100 MG capsule [id number] No Take 1 capsule (100 mg total) by mouth 3 (three) times daily.  Patient not taking: Reported on 07/29/2022   Joaquim Nam, MD Not Taking Active   lansoprazole (PREVACID) 30 MG capsule [id number] No TAKE ONE CAPSULE BY MOUTH TWICE DAILY Joaquim Nam, MD Taking Active Self, Other, Pharmacy Records, Multiple Informants  lisinopril (ZESTRIL) 5 MG tablet [id number] No 1 tab a day if BP is above 140/90. Joaquim Nam, MD Taking Active Self, Other, Pharmacy Records, Multiple Informants  ondansetron (ZOFRAN-ODT) 4 MG disintegrating tablet [id number] No Take 1 tablet (4 mg total) by mouth every 8 (eight) hours as needed for nausea or vomiting. Chesley Noon, MD Taking Active Self, Other, Pharmacy Records, Multiple Informants  sertraline (ZOLOFT) 100 MG tablet [id number] No TAKE 1 AND 1/2 TABLETS BY MOUTH ONCE DAILY Joaquim Nam, MD Taking Active Self, Other, Pharmacy Records, Multiple Informants  vitamin B-12 (CYANOCOBALAMIN) 1000 MCG tablet [id number] No Take 1,000 mcg by mouth daily. [provider] Taking Active Self, Other, Pharmacy Records, Multiple Informants            SDOH:  (Social Determinants of Health) assessments and interventions performed:  {yes/no:20286} SDOH Interventions    Flowsheet Row Office Visit from 07/15/2022 in [facility] Visit from 06/02/2022 in [facility] [place] Telephone from 11/26/2021 in [facility] Care Coordination Clinical Support from 11/10/2021 in [facility] Chronic Care Management from 01/22/2021 in [facility] Chronic Care Management from 09/07/2020 in [facility] [place]  SDOH Interventions        Food Insecurity Interventions -- -- Intervention Not Indicated Intervention Not Indicated -- --  Housing Interventions -- -- -- Intervention Not Indicated -- --  Transportation Interventions -- -- Intervention Not Indicated Intervention Not Indicated -- --  Depression Interventions/Treatment  Counseling Counseling -- -- -- --  Financial Strain Interventions -- -- -- Intervention Not Indicated Other (Comment) Intervention Not Indicated  Physical Activity Interventions -- -- -- Intervention Not Indicated -- --  Stress Interventions -- -- -- Intervention Not Indicated Provide Counseling --  Social Connections Interventions -- -- -- Intervention Not Indicated -- --       Medication Assistance: {MEDASSISTANCEINFO:25044}  Medication Access: Name and location of current pharmacy:  [facility] - [place], [place] - [address] [place] [address] Phone: [phone] Fax: [phone]  [facility] - [place], [place] - [address] [place] [address] Phone: [phone] Fax: [phone]  [facility] [address], [place] - [address] [place] [address] Phone: [phone] Fax: [phone]  Within the past 30 days, how often has patient missed a dose of medication? *** Is a pillbox or other method used to  improve adherence? {YES/NO:21197} Factors that may affect medication adherence? {CHL DESC; BARRIERS:21522} Are meds synced by  current pharmacy? {YES/NO:21197} Are meds delivered by current pharmacy? {YES/NO:21197} Does patient experience delays in picking up medications due to transportation concerns? {YES/NO:21197}  [facility] pill packs: 30 day supply. Last filled 08/09/22 Sertraline 100mg   take 1 1/2 tablets daily breakfast, evening meal  Lansoprazole 30mg  Take 1 tablet at breakfast and 1 tablet evening meal Diltiazem 180mg  24 hr capsule 1 capsule by mouth daily breakfast  Atorvastatin 80mg  Tablet 1 tablet by mouth daily breakfast Eliquis 5mg  take 1 tablet 2 times daily breakfast and evening meal  Compliance/Adherence/Medication fill history: Care Gaps: None  Star-Rating Drugs: Atorvastatin - PDC 100% Lisinopril - PRN use   Assessment/Plan  Hypertension (BP goal <140/90) -Controlled - per pt report; she is not using lisinopril very often, maybe 1-2 times per week -Current home readings: 113/62, 125/70 -Denies hypotensive/hypertensive symptoms -Current treatment: Lisinopril 5 mg daily PRN SBP > 140 - Appropriate, Effective, Safe, Accessible Diltiazem CD 180 mg daily - Appropriate, Effective, Safe, Accessible Furosemide 20 mg daily PRN - Appropriate, Effective, Safe, Accessible -Medications previously tried: n/a  -Educated on BP goals and benefits of medications for prevention of heart attack, stroke and kidney damage; Proper BP monitoring technique; Symptoms of hypotension and importance of maintaining adequate hydration; -Counseled to monitor BP at home daily -Recommended to continue current medication  Hyperlipidemia / CAD (LDL goal < 70) -Not ideally controlled - LDL 112 (10/2020), previously 80. Based on refill dates, pt was probably not compliant with atorvastatin for the month preceding labwork; she is now compliant with atorvastatin in daily pill packs -Hx CAD -Current  treatment: Atorvastatin 80 mg daily - Appropriate, Query Effective -Medications previously tried: n/a  -Educated on Cholesterol goals; Benefits of statin for ASCVD risk reduction; -Recommended to continue current medication  Atrial Fibrillation (Goal: prevent stroke and major bleeding) -Controlled -CHADSVASC: 7; hx GI bleed/BRBPR -Current treatment: Diltiazem CD 180 mg daily - Appropriate, Effective, Safe, Accessible Eliquis 5 mg BID -Appropriate, Effective, Safe, Accessible -Medications previously tried: n/a -Counseled on importance of adherence to anticoagulant exactly as prescribed; bleeding risk associated with Eliquis and importance of self-monitoring for signs/symptoms of bleeding; avoidance of NSAIDs due to increased bleeding risk with anticoagulants; -Recommended to continue current medication  Asthma (Goal: control symptoms and prevent exacerbations) -Controlled - per pt report; she does not have albuterol because she had a reaction to it previously (sore throat, dizziness) -Pulmonary function testing: none in chart -Exacerbations requiring treatment in last 6 months: 0 -Current treatment  Flovent 110 mcg/act 2 puffs BID - Appropriate, Effective, Safe, Accessible Flonase nasal spray PRN -Appropriate, Effective, Safe, Accessible -Medications previously tried: albuterol  -Patient reports consistent use of maintenance inhaler -Frequency of rescue inhaler use: n/a -Counseled on Proper inhaler technique; Benefits of consistent maintenance inhaler use -Recommended to continue current medication  Depression/Anxiety (Goal: manage symptoms) -Controlled -PHQ9: 0 (11/11/21) -GAD7: not on file -Connected with PCP for mental health support -Current treatment: Alprazolam 0.5 mg BID PRN - Appropriate, Effective, Safe, Accessible Sertraline 100 mg - 1.5 tab daily -Appropriate, Effective, Safe, Accessible -Medications previously tried/failed: n/a -Educated on Benefits of medication for  symptom control -Recommended to continue current medication  Gout (Goal: Prevent gout flares) -Controlled -Last Gout Flare: 10/2021; no recent uric acid on file -Current treatment  Colchicine 0.6 mg PRN -Appropriate, Effective, Safe, Accessible -Medications previously tried: n/a  -We discussed:  Counseled patient on low purine diet plan. Counseled patient to reduce consumption of high-fructose corn syrup, sweetened soft drinks, fruit juices, meat, and seafood. -Recommended to continue  current medication  GERD (Goal: minimize symptoms of reflux ) -Controlled -Hx recurrent GI bleed, most recent 07/2022 leading to hospitalization -Current treatment  Lansoprazole 30 mg BID - Appropriate, Effective, Safe, Accessible -Medications previously tried: none reported  -Recommended to continue current medication  ***

## 2022-08-16 NOTE — Telephone Encounter (Signed)
Care Management & Coordination Services Outreach Note  08/16/2022 Name: [name] MRN: [id number] DOB: Apr 05, 1935  Referred by: Joaquim Nam, MD  Patient had a phone appointment scheduled with clinical pharmacist today.  An unsuccessful telephone outreach was attempted today. The patient was referred to the pharmacist for assistance with medications, care management and care coordination.   Patient will NOT be penalized in any way for missing a Care Management & Coordination Services appointment. The no-show fee does not apply.  If possible, a message was left to return call to: [phone] or to [facility] [place].  Al Corpus, PharmD, BCACP Clinical Pharmacist [facility] [place] [facility] [phone]

## 2022-08-17 ENCOUNTER — Ambulatory Visit (INDEPENDENT_AMBULATORY_CARE_PROVIDER_SITE_OTHER): Payer: 59

## 2022-08-17 DIAGNOSIS — I739 Peripheral vascular disease, unspecified: Secondary | ICD-10-CM

## 2022-08-23 ENCOUNTER — Encounter (INDEPENDENT_AMBULATORY_CARE_PROVIDER_SITE_OTHER): Payer: 59 | Admitting: Vascular Surgery

## 2022-08-26 LAB — VAS US ABI WITH/WO TBI
Left ABI: 0.65
Right ABI: 0.61

## 2022-09-02 ENCOUNTER — Ambulatory Visit: Payer: Self-pay

## 2022-09-02 NOTE — Patient Outreach (Signed)
  Care Coordination   09/02/2022 Name: [name] MRN: [id number] DOB: February 05, 1936   Care Coordination Outreach Attempts:  An unsuccessful telephone outreach was attempted for a scheduled appointment today. Unable to leave voice message to patient due to voicemail box not being set up.  Follow Up Plan:  Additional outreach attempts will be made to offer the patient care coordination information and services.   Encounter Outcome:  No Answer   Care Coordination Interventions:  No, not indicated    [name] Ina [facility] Care Coordination [phone] direct line

## 2022-09-06 ENCOUNTER — Encounter (INDEPENDENT_AMBULATORY_CARE_PROVIDER_SITE_OTHER): Payer: Self-pay | Admitting: Vascular Surgery

## 2022-09-06 ENCOUNTER — Ambulatory Visit (INDEPENDENT_AMBULATORY_CARE_PROVIDER_SITE_OTHER): Payer: 59 | Admitting: Vascular Surgery

## 2022-09-06 VITALS — BP 119/69 | HR 58 | Resp 18 | Ht 66.0 in | Wt 183.0 lb

## 2022-09-06 DIAGNOSIS — M1990 Unspecified osteoarthritis, unspecified site: Secondary | ICD-10-CM

## 2022-09-06 DIAGNOSIS — I70222 Atherosclerosis of native arteries of extremities with rest pain, left leg: Secondary | ICD-10-CM

## 2022-09-06 DIAGNOSIS — I10 Essential (primary) hypertension: Secondary | ICD-10-CM

## 2022-09-06 DIAGNOSIS — E785 Hyperlipidemia, unspecified: Secondary | ICD-10-CM | POA: Diagnosis not present

## 2022-09-06 DIAGNOSIS — I70229 Atherosclerosis of native arteries of extremities with rest pain, unspecified extremity: Secondary | ICD-10-CM | POA: Insufficient documentation

## 2022-09-06 NOTE — Assessment & Plan Note (Signed)
ABIs were 0.61 on the right and 0.65 on the left with markedly reduced digital pressures and diminished waveforms.  Duplex shows occlusion of the SFAs bilaterally.   She describes symptoms that are concerning for rest pain on the left.  Her symptoms are more claudication on the right.  Given the markedly reduced blood flow and her significant symptoms, consideration for angiography with possible revascularization as appropriate.  Particularly on the left side, where rest pain is a critical and limb threatening situation.  I have discussed the pathophysiology and natural history of peripheral arterial disease in detail with the patient today.  I discussed the procedure and the risks and benefits of the procedure.  She voices her understanding and would like to proceed with lower extremity angiography with possible revascularization.  We will plan on left leg evaluation and intervention first with subsequent right leg evaluation and possible revascularization depending on the results on the left and the findings of angiography.

## 2022-09-06 NOTE — Assessment & Plan Note (Signed)
lipid control important in reducing the progression of atherosclerotic disease. Continue statin therapy  

## 2022-09-06 NOTE — H&P (View-Only) (Signed)
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    Hyaline Casts, UA PRESENT     Comment: Performed at [facility], [address] [place], [place] [address]  Type and screen [facility]     Status: None   Collection Time: 07/20/22  7:22 AM  Result Value Ref Range   ABO/RH(D) A POS    Antibody Screen NEG    Sample Expiration      07/23/2022,2359 Performed at [facility], [address] [place], [place] [address]   Protime-INR     Status: Abnormal   Collection Time: 07/20/22  7:22 AM  Result Value Ref Range   Prothrombin Time 15.7 (H) 11.4 - 15.2 seconds   INR 1.2 0.8 - 1.2    Comment: (NOTE) INR goal varies based  on device and disease states. Performed at [facility], [address] [place], [place] [address]   APTT     Status: Abnormal   Collection  Time: 07/20/22  7:22 AM  Result Value Ref Range   aPTT 38 (H) 24 - 36 seconds    Comment:        IF BASELINE aPTT IS ELEVATED, SUGGEST PATIENT RISK ASSESSMENT BE USED TO DETERMINE APPROPRIATE ANTICOAGULANT THERAPY. Performed at [facility], [address] [place], [place] [address]   Hemoglobin and hematocrit, blood     Status: Abnormal   Collection Time: 07/20/22 10:05 AM  Result Value Ref Range   Hemoglobin 11.6 (L) 12.0 - 15.0 g/dL   HCT 60.4 (L) 54.0 - 98.1 %    Comment: Performed at [facility], [address] [place], [place] [address]  Hemoglobin and hematocrit, blood     Status: Abnormal   Collection Time: 07/20/22  3:45 PM  Result Value Ref Range   Hemoglobin 11.4 (L) 12.0 - 15.0 g/dL   HCT 82.9 (L) 56.2 - 13.0 %    Comment: Performed at [facility], [address] [place], [place] [address]  Hemoglobin and hematocrit, blood     Status: Abnormal   Collection Time: 07/20/22 11:02 PM  Result Value Ref Range   Hemoglobin 11.2 (L) 12.0 - 15.0 g/dL   HCT 46.9 (L) 62.9 - 52.8 %    Comment: Performed at [facility], [address] [place], [place] [address]  Hemoglobin and hematocrit, blood     Status: Abnormal   Collection Time: 07/21/22  4:35 AM  Result Value Ref Range   Hemoglobin 11.1 (L) 12.0 - 15.0 g/dL   HCT 40.1 (L) 02.7 - 25.3 %    Comment: Performed at [facility], [address] [place], [place] [address]  Comprehensive metabolic panel     Status: Abnormal   Collection Time: 07/21/22  4:35 AM  Result Value Ref Range   Sodium 137 135 - 145 mmol/L   Potassium 3.4 (L) 3.5 - 5.1 mmol/L   Chloride 107 98 - 111 mmol/L   CO2 25 22 - 32 mmol/L   Glucose, Bld 105 (H) 70 - 99 mg/dL    Comment: Glucose reference range applies only to samples taken after fasting  for at least 8 hours.   BUN 13 8 - 23 mg/dL   Creatinine, Ser 3.47 0.44 - 1.00 mg/dL   Calcium 8.3 (L) 8.9 - 10.3 mg/dL   Total Protein 5.5 (L) 6.5 - 8.1 g/dL   Albumin 3.0 (L) 3.5 - 5.0 g/dL   AST 38 15 - 41 U/L   ALT 28 0 - 44 U/L   Alkaline Phosphatase 62 38 - 126 U/L   Total Bilirubin 0.8 0.3 - 1.2 mg/dL   GFR, Estimated >42 >59 mL/min    Comment: (NOTE) Calculated using the CKD-EPI Creatinine Equation (2021)    Anion gap 5 5 - 15    Comment: Performed at [facility], [address] [place], [place] [address]  Hemoglobin and hematocrit, blood     Status: Abnormal   Collection Time: 07/21/22 10:48 AM  Result Value Ref Range   Hemoglobin 11.7 (L) 12.0 - 15.0 g/dL   HCT 56.4 33.2 - 95.1 %    Comment: Performed at [facility], [address] [place], [place] [address]  Hemoglobin and hematocrit, blood     Status: Abnormal   Collection Time: 07/21/22  3:55 PM  Result Value Ref Range   Hemoglobin 11.8 (L) 12.0 - 15.0 g/dL   HCT 60.6 30.1 - 60.1 %    Comment: Performed  at [facility], [address] [place], [place] [address]  Hemoglobin and hematocrit, blood     Status: Abnormal   Collection Time: 07/21/22 10:03 PM  Result Value Ref Range   Hemoglobin 11.2 (L) 12.0 - 15.0 g/dL   HCT 32.2 (L) 02.5 - 42.7 %    Comment: Performed at [facility], [address] [place], [place] [address]  Hemoglobin and hematocrit, blood     Status: Abnormal   Collection Time: 07/22/22  3:49 AM  Result Value Ref Range   Hemoglobin 10.8 (L) 12.0 - 15.0 g/dL   HCT 62.8 (L) 31.5 - 17.6 %    Comment: Performed at [facility], [address] [place], [place] [address]  Hemoglobin and hematocrit, blood     Status: Abnormal   Collection Time: 07/22/22  9:37 AM  Result Value Ref Range   Hemoglobin 11.9 (L) 12.0 - 15.0 g/dL   HCT 71.0 62.6 - 94.8 %    Comment: Performed at [facility], [address], [place], [place] [address]   Basic metabolic panel     Status: Abnormal   Collection Time: 07/29/22 10:25 AM  Result Value Ref Range   Sodium 138 135 - 145 mEq/L   Potassium 3.3 (L) 3.5 - 5.1 mEq/L   Chloride 101 96 - 112 mEq/L   CO2 31 19 - 32 mEq/L   Glucose, Bld 87 70 - 99 mg/dL   BUN 9 6 - 23 mg/dL   Creatinine, Ser 0.35 0.40 - 1.20 mg/dL   GFR 00.93 >81.82 mL/min    Comment: Calculated using the CKD-EPI Creatinine Equation (2021)   Calcium 8.9 8.4 - 10.5 mg/dL  CBC with Differential/Platelet     Status: Abnormal   Collection Time: 07/29/22 10:25 AM  Result Value Ref Range   WBC 8.4 4.0 - 10.5 K/uL   RBC 4.31 3.87 - 5.11 Mil/uL   Hemoglobin 12.4 12.0 - 15.0 g/dL   HCT 99.3 71.6 - 96.7 %   MCV 87.2 78.0 - 100.0 fl   MCHC 33.1 30.0 - 36.0 g/dL   RDW 89.3 81.0 - 17.5 %   Platelets 174.0 150.0 - 400.0 K/uL   Neutrophils Relative % 80.8 (H) 43.0 - 77.0 %   Lymphocytes Relative 10.7 (L) 12.0 - 46.0 %   Monocytes Relative 7.7 3.0 - 12.0 %   Eosinophils Relative 0.5 0.0 - 5.0 %   Basophils Relative 0.3 0.0 - 3.0 %   Neutro Abs 6.8 1.4 - 7.7 K/uL   Lymphs Abs 0.9 0.7 - 4.0 K/uL   Monocytes Absolute 0.6 0.1 - 1.0 K/uL   Eosinophils Absolute 0.0 0.0 - 0.7 K/uL   Basophils Absolute 0.0 0.0 - 0.1 K/uL  VAS [place] ABI WITH/WO TBI     Status: None   Collection Time: 08/17/22 11:12 AM  Result Value Ref Range   Right ABI 0.61    Left ABI 0.65     Assessment/Plan:  Atherosclerosis of native arteries of extremity with rest pain (HCC) ABIs were 0.61 on the right and 0.65 on the left with markedly reduced digital pressures and diminished waveforms.  Duplex shows occlusion of the SFAs bilaterally.   She describes symptoms that are concerning for rest pain on the left.  Her symptoms are more claudication on the right.  Given the markedly reduced blood flow and her significant symptoms, consideration for angiography with possible revascularization as appropriate.  Particularly on the left side, where rest pain is a  critical and limb threatening situation.  I have discussed the pathophysiology and natural history of peripheral arterial disease in detail with the patient today.  I discussed the procedure and the risks and benefits of the procedure.  She voices her understanding and would like to proceed with lower extremity angiography with possible revascularization.  We will plan on left leg evaluation and intervention first with subsequent right leg evaluation and possible revascularization depending on the results on the left and the findings of angiography.  Essential hypertension blood pressure control important in reducing the progression of atherosclerotic disease. On appropriate oral medications.   Arthritis Likely a contributing factor to her lower extremity symptoms although it seems like PAD is the primary culprit.  Hyperlipidemia lipid control important in reducing the progression of atherosclerotic disease. Continue statin therapy      Festus Barren 09/06/2022, 3:18 PM   This note was created with Dragon medical transcription system.  Any errors from dictation are unintentional.

## 2022-09-06 NOTE — Assessment & Plan Note (Signed)
blood pressure control important in reducing the progression of atherosclerotic disease. On appropriate oral medications.  

## 2022-09-06 NOTE — H&P (View-Only) (Signed)
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 PRESENT    Hyaline Casts, UA PRESENT     Comment: Performed at [facility], [address] [place], [place] [address]  Type and screen [facility]     Status: None   Collection Time: 07/20/22  7:22 AM  Result Value Ref Range   ABO/RH(D) A POS    Antibody Screen NEG    Sample Expiration      07/23/2022,2359 Performed at [facility], [address] [place], [place] [address]   Protime-INR     Status: Abnormal   Collection Time: 07/20/22  7:22 AM  Result Value Ref Range   Prothrombin Time 15.7 (H) 11.4 - 15.2 seconds   INR 1.2 0.8 - 1.2    Comment: (NOTE) INR goal varies based  on device and disease states. Performed at [facility], [address] [place], [place] [address]   APTT     Status: Abnormal   Collection  Time: 07/20/22  7:22 AM  Result Value Ref Range   aPTT 38 (H) 24 - 36 seconds    Comment:        IF BASELINE aPTT IS ELEVATED, SUGGEST PATIENT RISK ASSESSMENT BE USED TO DETERMINE APPROPRIATE ANTICOAGULANT THERAPY. Performed at [facility], [address] [place], [place] [address]   Hemoglobin and hematocrit, blood     Status: Abnormal   Collection Time: 07/20/22 10:05 AM  Result Value Ref Range   Hemoglobin 11.6 (L) 12.0 - 15.0 g/dL   HCT 60.4 (L) 54.0 - 98.1 %    Comment: Performed at [facility], [address] [place], [place] [address]  Hemoglobin and hematocrit, blood     Status: Abnormal   Collection Time: 07/20/22  3:45 PM  Result Value Ref Range   Hemoglobin 11.4 (L) 12.0 - 15.0 g/dL   HCT 82.9 (L) 56.2 - 13.0 %    Comment: Performed at [facility], [address] [place], [place] [address]  Hemoglobin and hematocrit, blood     Status: Abnormal   Collection Time: 07/20/22 11:02 PM  Result Value Ref Range   Hemoglobin 11.2 (L) 12.0 - 15.0 g/dL   HCT 46.9 (L) 62.9 - 52.8 %    Comment: Performed at [facility], [address] [place], [place] [address]  Hemoglobin and hematocrit, blood     Status: Abnormal   Collection Time: 07/21/22  4:35 AM  Result Value Ref Range   Hemoglobin 11.1 (L) 12.0 - 15.0 g/dL   HCT 40.1 (L) 02.7 - 25.3 %    Comment: Performed at [facility], [address] [place], [place] [address]  Comprehensive metabolic panel     Status: Abnormal   Collection Time: 07/21/22  4:35 AM  Result Value Ref Range   Sodium 137 135 - 145 mmol/L   Potassium 3.4 (L) 3.5 - 5.1 mmol/L   Chloride 107 98 - 111 mmol/L   CO2 25 22 - 32 mmol/L   Glucose, Bld 105 (H) 70 - 99 mg/dL    Comment: Glucose reference range applies only to samples taken after fasting  for at least 8 hours.   BUN 13 8 - 23 mg/dL   Creatinine, Ser 3.47 0.44 - 1.00 mg/dL   Calcium 8.3 (L) 8.9 - 10.3 mg/dL   Total Protein 5.5 (L) 6.5 - 8.1 g/dL   Albumin 3.0 (L) 3.5 - 5.0 g/dL   AST 38 15 - 41 U/L   ALT 28 0 - 44 U/L   Alkaline Phosphatase 62 38 - 126 U/L   Total Bilirubin 0.8 0.3 - 1.2 mg/dL   GFR, Estimated >42 >59 mL/min    Comment: (NOTE) Calculated using the CKD-EPI Creatinine Equation (2021)    Anion gap 5 5 - 15    Comment: Performed at [facility], [address] [place], [place] [address]  Hemoglobin and hematocrit, blood     Status: Abnormal   Collection Time: 07/21/22 10:48 AM  Result Value Ref Range   Hemoglobin 11.7 (L) 12.0 - 15.0 g/dL   HCT 56.4 33.2 - 95.1 %    Comment: Performed at [facility], [address] [place], [place] [address]  Hemoglobin and hematocrit, blood     Status: Abnormal   Collection Time: 07/21/22  3:55 PM  Result Value Ref Range   Hemoglobin 11.8 (L) 12.0 - 15.0 g/dL   HCT 60.6 30.1 - 60.1 %    Comment: Performed  at [facility], [address] [place], [place] [address]  Hemoglobin and hematocrit, blood     Status: Abnormal   Collection Time: 07/21/22 10:03 PM  Result Value Ref Range   Hemoglobin 11.2 (L) 12.0 - 15.0 g/dL   HCT 32.2 (L) 02.5 - 42.7 %    Comment: Performed at [facility], [address] [place], [place] [address]  Hemoglobin and hematocrit, blood     Status: Abnormal   Collection Time: 07/22/22  3:49 AM  Result Value Ref Range   Hemoglobin 10.8 (L) 12.0 - 15.0 g/dL   HCT 62.8 (L) 31.5 - 17.6 %    Comment: Performed at [facility], [address] [place], [place] [address]  Hemoglobin and hematocrit, blood     Status: Abnormal   Collection Time: 07/22/22  9:37 AM  Result Value Ref Range   Hemoglobin 11.9 (L) 12.0 - 15.0 g/dL   HCT 71.0 62.6 - 94.8 %    Comment: Performed at [facility], [address], [place], [place] [address]   Basic metabolic panel     Status: Abnormal   Collection Time: 07/29/22 10:25 AM  Result Value Ref Range   Sodium 138 135 - 145 mEq/L   Potassium 3.3 (L) 3.5 - 5.1 mEq/L   Chloride 101 96 - 112 mEq/L   CO2 31 19 - 32 mEq/L   Glucose, Bld 87 70 - 99 mg/dL   BUN 9 6 - 23 mg/dL   Creatinine, Ser 0.35 0.40 - 1.20 mg/dL   GFR 00.93 >81.82 mL/min    Comment: Calculated using the CKD-EPI Creatinine Equation (2021)   Calcium 8.9 8.4 - 10.5 mg/dL  CBC with Differential/Platelet     Status: Abnormal   Collection Time: 07/29/22 10:25 AM  Result Value Ref Range   WBC 8.4 4.0 - 10.5 K/uL   RBC 4.31 3.87 - 5.11 Mil/uL   Hemoglobin 12.4 12.0 - 15.0 g/dL   HCT 99.3 71.6 - 96.7 %   MCV 87.2 78.0 - 100.0 fl   MCHC 33.1 30.0 - 36.0 g/dL   RDW 89.3 81.0 - 17.5 %   Platelets 174.0 150.0 - 400.0 K/uL   Neutrophils Relative % 80.8 (H) 43.0 - 77.0 %   Lymphocytes Relative 10.7 (L) 12.0 - 46.0 %   Monocytes Relative 7.7 3.0 - 12.0 %   Eosinophils Relative 0.5 0.0 - 5.0 %   Basophils Relative 0.3 0.0 - 3.0 %   Neutro Abs 6.8 1.4 - 7.7 K/uL   Lymphs Abs 0.9 0.7 - 4.0 K/uL   Monocytes Absolute 0.6 0.1 - 1.0 K/uL   Eosinophils Absolute 0.0 0.0 - 0.7 K/uL   Basophils Absolute 0.0 0.0 - 0.1 K/uL  VAS [place] ABI WITH/WO TBI     Status: None   Collection Time: 08/17/22 11:12 AM  Result Value Ref Range   Right ABI 0.61    Left ABI 0.65     Assessment/Plan:  Atherosclerosis of native arteries of extremity with rest pain (HCC) ABIs were 0.61 on the right and 0.65 on the left with markedly reduced digital pressures and diminished waveforms.  Duplex shows occlusion of the SFAs bilaterally.   She describes symptoms that are concerning for rest pain on the left.  Her symptoms are more claudication on the right.  Given the markedly reduced blood flow and her significant symptoms, consideration for angiography with possible revascularization as appropriate.  Particularly on the left side, where rest pain is a  critical and limb threatening situation.  I have discussed the pathophysiology and natural history of peripheral arterial disease in detail with the patient today.  I discussed the procedure and the risks and benefits of the procedure.  She voices her understanding and would like to proceed with lower extremity angiography with possible revascularization.  We will plan on left leg evaluation and intervention first with subsequent right leg evaluation and possible revascularization depending on the results on the left and the findings of angiography.  Essential hypertension blood pressure control important in reducing the progression of atherosclerotic disease. On appropriate oral medications.   Arthritis Likely a contributing factor to her lower extremity symptoms although it seems like PAD is the primary culprit.  Hyperlipidemia lipid control important in reducing the progression of atherosclerotic disease. Continue statin therapy      Festus Barren 09/06/2022, 3:18 PM   This note was created with Dragon medical transcription system.  Any errors from dictation are unintentional.

## 2022-09-06 NOTE — Assessment & Plan Note (Signed)
Likely a contributing factor to her lower extremity symptoms although it seems like PAD is the primary culprit.

## 2022-09-06 NOTE — Patient Instructions (Signed)
Angiogram, Care After After the procedure, it is common to have: Bruising and tenderness at the catheter insertion area. A collection of blood (hematoma) at the insertion area. This may feel like a small lump under the skin at the insertion site. Follow these instructions at home: Insertion site care  Follow instructions from your health care provider about how to take care of your insertion site. Make sure you: Wash your hands with soap and water for at least 20 seconds before and after you change your bandage (dressing). If soap and water are not available, use hand sanitizer. Change your dressing as told by your health care provider. Do not take baths, swim, or use a hot tub until your health care provider approves. You may shower 24-48 hours after the procedure, or as told by your health care provider. To clean the insertion site: Gently wash the area with plain soap and water. Pat the area dry with a clean towel. Do not rub the site. This may cause bleeding. Do not apply powder or lotion to the site. Keep the site clean and dry. Check your insertion site every day for signs of infection. Check for: Redness, swelling, or pain. Fluid or blood. Warmth. Pus or a bad smell. Activity If you were given a sedative during the procedure, it can affect you for several hours. Do not drive or operate machinery until your health care provider says that it is safe. Rest as told by your health care provider. This is usually for 1-2 days. If the catheter was inserted through your leg, avoid walking up or down the stairs for a few days. If the catheter was inserted through your wrist, avoid repetitive hand and wrist movement for a few days. Return to your normal activities as told by your health care provider, usually in about a week. Ask your health care provider what activities are safe for you. General instructions If your insertion site starts to bleed, lie flat and put pressure on the site. If  the bleeding does not stop, get help right away. This is a medical emergency. Take over-the-counter and prescription medicines only as told by your health care provider. Drink enough fluid to keep your urine pale yellow. This helps flush the contrast dye from your body. Keep all follow-up visits for continued treatment and for your safety. Contact a health care provider if: You have a fever or chills. You have any signs of infection at the insertion site. You have slight bleeding from the insertion area. Hold pressure on the area. Get help right away if: You have a problem in the insertion area, such as: Severe pain, rapid swelling, or bleeding that does not stop when you apply firm pressure to the area. Fluid, pus or a bad smell coming from your insertion site. The insertion site or limb becomes pale, cool, tingly, or numb. Pain in the limb of the insertion site. You have chest pain. You have trouble breathing. You have any symptoms of a stroke. "BE FAST" is an easy way to remember the main warning signs of a stroke: B - Balance. Signs are dizziness, sudden trouble walking, or loss of balance. E - Eyes. Signs are trouble seeing or a sudden change in vision. F - Face. Signs are sudden weakness or loss of feeling of the face, or the face or eyelid drooping on one side. A - Arms. Signs are weakness or loss of feeling in an arm. This happens suddenly and usually on one side of the  body. S - Speech. Signs are sudden trouble speaking, slurred speech, or trouble understanding what people say. T - Time. Time to call emergency services. Write down what time symptoms started. You have other signs of a stroke, such as: A sudden, severe headache with no known cause. Nausea or vomiting. Seizure. You have severe pain in your hand or leg. These symptoms may be an emergency. Get help right away. Call 911. Do not wait to see if the symptoms will go away. Do not drive yourself to the hospital. This  information is not intended to replace advice given to you by your health care provider. Make sure you discuss any questions you have with your health care provider. Document Revised: 09/22/2021 Document Reviewed: 09/22/2021 Elsevier Patient Education  2024 [employer].

## 2022-09-06 NOTE — Progress Notes (Signed)
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 PRESENT    Hyaline Casts, UA PRESENT     Comment: Performed at [facility], [address] [place], [place] [address]  Type and screen [facility]     Status: None   Collection Time: 07/20/22  7:22 AM  Result Value Ref Range   ABO/RH(D) A POS    Antibody Screen NEG    Sample Expiration      07/23/2022,2359 Performed at [facility], [address] [place], [place] [address]   Protime-INR     Status: Abnormal   Collection Time: 07/20/22  7:22 AM  Result Value Ref Range   Prothrombin Time 15.7 (H) 11.4 - 15.2 seconds   INR 1.2 0.8 - 1.2    Comment: (NOTE) INR goal varies based  on device and disease states. Performed at [facility], [address] [place], [place] [address]   APTT     Status: Abnormal   Collection  Time: 07/20/22  7:22 AM  Result Value Ref Range   aPTT 38 (H) 24 - 36 seconds    Comment:        IF BASELINE aPTT IS ELEVATED, SUGGEST PATIENT RISK ASSESSMENT BE USED TO DETERMINE APPROPRIATE ANTICOAGULANT THERAPY. Performed at [facility], [address] [place], [place] [address]   Hemoglobin and hematocrit, blood     Status: Abnormal   Collection Time: 07/20/22 10:05 AM  Result Value Ref Range   Hemoglobin 11.6 (L) 12.0 - 15.0 g/dL   HCT 60.4 (L) 54.0 - 98.1 %    Comment: Performed at [facility], [address] [place], [place] [address]  Hemoglobin and hematocrit, blood     Status: Abnormal   Collection Time: 07/20/22  3:45 PM  Result Value Ref Range   Hemoglobin 11.4 (L) 12.0 - 15.0 g/dL   HCT 82.9 (L) 56.2 - 13.0 %    Comment: Performed at [facility], [address] [place], [place] [address]  Hemoglobin and hematocrit, blood     Status: Abnormal   Collection Time: 07/20/22 11:02 PM  Result Value Ref Range   Hemoglobin 11.2 (L) 12.0 - 15.0 g/dL   HCT 46.9 (L) 62.9 - 52.8 %    Comment: Performed at [facility], [address] [place], [place] [address]  Hemoglobin and hematocrit, blood     Status: Abnormal   Collection Time: 07/21/22  4:35 AM  Result Value Ref Range   Hemoglobin 11.1 (L) 12.0 - 15.0 g/dL   HCT 40.1 (L) 02.7 - 25.3 %    Comment: Performed at [facility], [address] [place], [place] [address]  Comprehensive metabolic panel     Status: Abnormal   Collection Time: 07/21/22  4:35 AM  Result Value Ref Range   Sodium 137 135 - 145 mmol/L   Potassium 3.4 (L) 3.5 - 5.1 mmol/L   Chloride 107 98 - 111 mmol/L   CO2 25 22 - 32 mmol/L   Glucose, Bld 105 (H) 70 - 99 mg/dL    Comment: Glucose reference range applies only to samples taken after fasting  for at least 8 hours.   BUN 13 8 - 23 mg/dL   Creatinine, Ser 3.47 0.44 - 1.00 mg/dL   Calcium 8.3 (L) 8.9 - 10.3 mg/dL   Total Protein 5.5 (L) 6.5 - 8.1 g/dL   Albumin 3.0 (L) 3.5 - 5.0 g/dL   AST 38 15 - 41 U/L   ALT 28 0 - 44 U/L   Alkaline Phosphatase 62 38 - 126 U/L   Total Bilirubin 0.8 0.3 - 1.2 mg/dL   GFR, Estimated >42 >59 mL/min    Comment: (NOTE) Calculated using the CKD-EPI Creatinine Equation (2021)    Anion gap 5 5 - 15    Comment: Performed at [facility], [address] [place], [place] [address]  Hemoglobin and hematocrit, blood     Status: Abnormal   Collection Time: 07/21/22 10:48 AM  Result Value Ref Range   Hemoglobin 11.7 (L) 12.0 - 15.0 g/dL   HCT 56.4 33.2 - 95.1 %    Comment: Performed at [facility], [address] [place], [place] [address]  Hemoglobin and hematocrit, blood     Status: Abnormal   Collection Time: 07/21/22  3:55 PM  Result Value Ref Range   Hemoglobin 11.8 (L) 12.0 - 15.0 g/dL   HCT 60.6 30.1 - 60.1 %    Comment: Performed  at [facility], [address] [place], [place] [address]  Hemoglobin and hematocrit, blood     Status: Abnormal   Collection Time: 07/21/22 10:03 PM  Result Value Ref Range   Hemoglobin 11.2 (L) 12.0 - 15.0 g/dL   HCT 32.2 (L) 02.5 - 42.7 %    Comment: Performed at [facility], [address] [place], [place] [address]  Hemoglobin and hematocrit, blood     Status: Abnormal   Collection Time: 07/22/22  3:49 AM  Result Value Ref Range   Hemoglobin 10.8 (L) 12.0 - 15.0 g/dL   HCT 62.8 (L) 31.5 - 17.6 %    Comment: Performed at [facility], [address] [place], [place] [address]  Hemoglobin and hematocrit, blood     Status: Abnormal   Collection Time: 07/22/22  9:37 AM  Result Value Ref Range   Hemoglobin 11.9 (L) 12.0 - 15.0 g/dL   HCT 71.0 62.6 - 94.8 %    Comment: Performed at [facility], [address], [place], [place] [address]   Basic metabolic panel     Status: Abnormal   Collection Time: 07/29/22 10:25 AM  Result Value Ref Range   Sodium 138 135 - 145 mEq/L   Potassium 3.3 (L) 3.5 - 5.1 mEq/L   Chloride 101 96 - 112 mEq/L   CO2 31 19 - 32 mEq/L   Glucose, Bld 87 70 - 99 mg/dL   BUN 9 6 - 23 mg/dL   Creatinine, Ser 0.35 0.40 - 1.20 mg/dL   GFR 00.93 >81.82 mL/min    Comment: Calculated using the CKD-EPI Creatinine Equation (2021)   Calcium 8.9 8.4 - 10.5 mg/dL  CBC with Differential/Platelet     Status: Abnormal   Collection Time: 07/29/22 10:25 AM  Result Value Ref Range   WBC 8.4 4.0 - 10.5 K/uL   RBC 4.31 3.87 - 5.11 Mil/uL   Hemoglobin 12.4 12.0 - 15.0 g/dL   HCT 99.3 71.6 - 96.7 %   MCV 87.2 78.0 - 100.0 fl   MCHC 33.1 30.0 - 36.0 g/dL   RDW 89.3 81.0 - 17.5 %   Platelets 174.0 150.0 - 400.0 K/uL   Neutrophils Relative % 80.8 (H) 43.0 - 77.0 %   Lymphocytes Relative 10.7 (L) 12.0 - 46.0 %   Monocytes Relative 7.7 3.0 - 12.0 %   Eosinophils Relative 0.5 0.0 - 5.0 %   Basophils Relative 0.3 0.0 - 3.0 %   Neutro Abs 6.8 1.4 - 7.7 K/uL   Lymphs Abs 0.9 0.7 - 4.0 K/uL   Monocytes Absolute 0.6 0.1 - 1.0 K/uL   Eosinophils Absolute 0.0 0.0 - 0.7 K/uL   Basophils Absolute 0.0 0.0 - 0.1 K/uL  VAS [place] ABI WITH/WO TBI     Status: None   Collection Time: 08/17/22 11:12 AM  Result Value Ref Range   Right ABI 0.61    Left ABI 0.65     Assessment/Plan:  Atherosclerosis of native arteries of extremity with rest pain (HCC) ABIs were 0.61 on the right and 0.65 on the left with markedly reduced digital pressures and diminished waveforms.  Duplex shows occlusion of the SFAs bilaterally.   She describes symptoms that are concerning for rest pain on the left.  Her symptoms are more claudication on the right.  Given the markedly reduced blood flow and her significant symptoms, consideration for angiography with possible revascularization as appropriate.  Particularly on the left side, where rest pain is a  critical and limb threatening situation.  I have discussed the pathophysiology and natural history of peripheral arterial disease in detail with the patient today.  I discussed the procedure and the risks and benefits of the procedure.  She voices her understanding and would like to proceed with lower extremity angiography with possible revascularization.  We will plan on left leg evaluation and intervention first with subsequent right leg evaluation and possible revascularization depending on the results on the left and the findings of angiography.  Essential hypertension blood pressure control important in reducing the progression of atherosclerotic disease. On appropriate oral medications.   Arthritis Likely a contributing factor to her lower extremity symptoms although it seems like PAD is the primary culprit.  Hyperlipidemia lipid control important in reducing the progression of atherosclerotic disease. Continue statin therapy      Festus Barren 09/06/2022, 3:18 PM   This note was created with Dragon medical transcription system.  Any errors from dictation are unintentional.

## 2022-09-08 ENCOUNTER — Telehealth: Payer: Self-pay | Admitting: *Deleted

## 2022-09-08 ENCOUNTER — Telehealth: Payer: Self-pay | Admitting: Family Medicine

## 2022-09-08 NOTE — Progress Notes (Signed)
  Care Coordination Note  09/08/2022 Name: [name] MRN: [id number] DOB: 03-23-1935  [name] is a 87 y.o. year old female who is a primary care patient of Joaquim Nam, MD and is actively engaged with the care management team. I reached out to [name] by phone today to assist with re-scheduling a follow up visit with the RN Case Manager  Follow up plan: Unsuccessful telephone outreach attempt made. A HIPAA compliant phone message was left for the patient providing contact information and requesting a return call.   Burman Nieves, CCMA Care Coordination Care Guide Direct Dial: [phone]

## 2022-09-08 NOTE — Telephone Encounter (Signed)
error 

## 2022-09-12 ENCOUNTER — Encounter: Payer: Self-pay | Admitting: Family Medicine

## 2022-09-12 ENCOUNTER — Ambulatory Visit (INDEPENDENT_AMBULATORY_CARE_PROVIDER_SITE_OTHER): Payer: 59 | Admitting: Family Medicine

## 2022-09-12 VITALS — BP 130/68 | HR 71 | Temp 97.9°F | Ht 66.0 in | Wt 182.0 lb

## 2022-09-12 DIAGNOSIS — M109 Gout, unspecified: Secondary | ICD-10-CM | POA: Diagnosis not present

## 2022-09-12 LAB — URIC ACID: Uric Acid, Serum: 3.3 mg/dL (ref 2.4–7.0)

## 2022-09-12 MED ORDER — COLCHICINE 0.6 MG PO TABS: 0.3000 mg | ORAL_TABLET | Freq: Every day | ORAL | 0 refills | Status: DC | PRN

## 2022-09-12 NOTE — Patient Instructions (Signed)
Take 1/2 tab of colchicine if needed for foot pain.  Let me know if that isn't helping.  Go to the lab on the way out.   If you have mychart we'll likely use that to update you.    Take care.  Glad to see you.

## 2022-09-12 NOTE — Progress Notes (Unsigned)
Walking with cane. Foot pain and swelling x 1-2 weeks. Patient thinks she may have gout. States she was on gout medication in the past.  She has L>R distal MT pain.  Took colchicine in the past but not recently.  Pain can start at the 1st MT joint.    D/w pt about vascular eval.  No bleeding.  She is trying to get scheduled in the meantime.    Her grandson is now in foster care, discussed with patient.  She is still in contact with her grandson.  Meds, vitals, and allergies reviewed.   ROS: Per HPI unless specifically indicated in ROS section   [place] supple, no LA She sounds to be RRR Ctab L 1st MT ttp but not red, no overlying skin changes.

## 2022-09-13 ENCOUNTER — Telehealth (INDEPENDENT_AMBULATORY_CARE_PROVIDER_SITE_OTHER): Payer: Self-pay

## 2022-09-13 NOTE — Telephone Encounter (Signed)
I attempted to contact the patient to schedule a BLE angio with Dr. Wyn Quaker. A message was left for a return call.

## 2022-09-14 NOTE — Assessment & Plan Note (Signed)
Presumed. Take 1/2 tab of colchicine if needed for foot pain.  Let me know if that isn't helping.  See notes on labs.

## 2022-09-20 ENCOUNTER — Telehealth (INDEPENDENT_AMBULATORY_CARE_PROVIDER_SITE_OTHER): Payer: Self-pay

## 2022-09-20 NOTE — Telephone Encounter (Signed)
Spoke with the patient and she is scheduled with Dr. Wyn Quaker on 09/26/22 (6:45 am arrival) and 10/03/22 (6:45 am arrival) to the [facility] for BLE angio left first. Pre-procedure instructions were discussed and will be sent to Mychart and mailed.

## 2022-09-26 ENCOUNTER — Ambulatory Visit
Admission: RE | Admit: 2022-09-26 | Discharge: 2022-09-26 | Disposition: A | Discharge status: Home / Self Care | Payer: 59 | Source: Home / Self Care | Attending: Vascular Surgery | Admitting: Vascular Surgery

## 2022-09-26 ENCOUNTER — Encounter
Admission: RE | Disposition: A | Discharge status: Home / Self Care | Payer: Self-pay | Source: Home / Self Care | Attending: Vascular Surgery

## 2022-09-26 ENCOUNTER — Other Ambulatory Visit: Payer: Self-pay | Admitting: Family Medicine

## 2022-09-26 DIAGNOSIS — E785 Hyperlipidemia, unspecified: Secondary | ICD-10-CM | POA: Insufficient documentation

## 2022-09-26 DIAGNOSIS — M1990 Unspecified osteoarthritis, unspecified site: Secondary | ICD-10-CM | POA: Insufficient documentation

## 2022-09-26 DIAGNOSIS — I743 Embolism and thrombosis of arteries of the lower extremities: Secondary | ICD-10-CM

## 2022-09-26 DIAGNOSIS — I70222 Atherosclerosis of native arteries of extremities with rest pain, left leg: Secondary | ICD-10-CM | POA: Insufficient documentation

## 2022-09-26 DIAGNOSIS — I70229 Atherosclerosis of native arteries of extremities with rest pain, unspecified extremity: Secondary | ICD-10-CM

## 2022-09-26 DIAGNOSIS — I701 Atherosclerosis of renal artery: Secondary | ICD-10-CM | POA: Diagnosis not present

## 2022-09-26 DIAGNOSIS — I10 Essential (primary) hypertension: Secondary | ICD-10-CM | POA: Insufficient documentation

## 2022-09-26 HISTORY — PX: LOWER EXTREMITY ANGIOGRAPHY: CATH118251

## 2022-09-26 LAB — CREATININE, SERUM
Creatinine, Ser: 0.73 mg/dL (ref 0.44–1.00)
GFR, Estimated: >60 mL/min (ref >60)

## 2022-09-26 LAB — BUN: BUN: 13 mg/dL (ref 8–23)

## 2022-09-26 SURGERY — LOWER EXTREMITY ANGIOGRAPHY
Anesthesia: Moderate Sedation | Site: Leg Lower | Laterality: Left

## 2022-09-26 MED ORDER — SODIUM CHLORIDE 0.9 % IV SOLN: 250.0000 mL | INTRAVENOUS | Status: DC | PRN

## 2022-09-26 MED ORDER — SODIUM CHLORIDE 0.9% FLUSH: 3.0000 mL | Freq: Two times a day (BID) | INTRAVENOUS | Status: DC

## 2022-09-26 MED ORDER — CEFAZOLIN SODIUM-DEXTROSE 2-4 GM/100ML-% IV SOLN
2.0000 g | INTRAVENOUS | Status: AC
  Administered 2022-09-26: 2 g via INTRAVENOUS

## 2022-09-26 MED ORDER — FAMOTIDINE 20 MG PO TABS: 40.0000 mg | ORAL_TABLET | Freq: Once | ORAL | Status: DC | PRN

## 2022-09-26 MED ORDER — HYDROMORPHONE HCL 1 MG/ML IJ SOLN: 1.0000 mg | Freq: Once | INTRAMUSCULAR | Status: DC | PRN

## 2022-09-26 MED ORDER — LABETALOL HCL 5 MG/ML IV SOLN
INTRAVENOUS | Status: DC | PRN
  Administered 2022-09-26: 10 mg via INTRAVENOUS

## 2022-09-26 MED ORDER — HEPARIN SODIUM (PORCINE) 1000 UNIT/ML IJ SOLN
INTRAMUSCULAR | Status: AC
  Filled 2022-09-26: qty 10

## 2022-09-26 MED ORDER — DIPHENHYDRAMINE HCL 50 MG/ML IJ SOLN: 50.0000 mg | Freq: Once | INTRAMUSCULAR | Status: DC | PRN

## 2022-09-26 MED ORDER — CEFAZOLIN SODIUM-DEXTROSE 2-4 GM/100ML-% IV SOLN
INTRAVENOUS | Status: AC
  Filled 2022-09-26: qty 100

## 2022-09-26 MED ORDER — FENTANYL CITRATE (PF) 100 MCG/2ML IJ SOLN
INTRAMUSCULAR | Status: AC
  Filled 2022-09-26: qty 2

## 2022-09-26 MED ORDER — HEPARIN SODIUM (PORCINE) 1000 UNIT/ML IJ SOLN
INTRAMUSCULAR | Status: DC | PRN
  Administered 2022-09-26: 5000 [IU] via INTRAVENOUS

## 2022-09-26 MED ORDER — FENTANYL CITRATE (PF) 100 MCG/2ML IJ SOLN
INTRAMUSCULAR | Status: DC | PRN
  Administered 2022-09-26: 12.5 ug via INTRAVENOUS
  Administered 2022-09-26: 50 ug via INTRAVENOUS

## 2022-09-26 MED ORDER — SODIUM CHLORIDE 0.9 % IV SOLN: INTRAVENOUS | Status: DC

## 2022-09-26 MED ORDER — ONDANSETRON HCL 4 MG/2ML IJ SOLN: 4.0000 mg | Freq: Four times a day (QID) | INTRAMUSCULAR | Status: DC | PRN

## 2022-09-26 MED ORDER — LABETALOL HCL 5 MG/ML IV SOLN: 10.0000 mg | INTRAVENOUS | Status: DC | PRN

## 2022-09-26 MED ORDER — ASPIRIN 81 MG PO TBEC: 81.0000 mg | DELAYED_RELEASE_TABLET | Freq: Every day | ORAL | Status: DC

## 2022-09-26 MED ORDER — SODIUM CHLORIDE 0.9% FLUSH: 3.0000 mL | INTRAVENOUS | Status: DC | PRN

## 2022-09-26 MED ORDER — MIDAZOLAM HCL 2 MG/2ML IJ SOLN
INTRAMUSCULAR | Status: DC | PRN
  Administered 2022-09-26: .5 mg via INTRAVENOUS
  Administered 2022-09-26: 1 mg via INTRAVENOUS

## 2022-09-26 MED ORDER — LABETALOL HCL 5 MG/ML IV SOLN
INTRAVENOUS | Status: AC
  Filled 2022-09-26: qty 4

## 2022-09-26 MED ORDER — APIXABAN 5 MG PO TABS
5.0000 mg | ORAL_TABLET | ORAL | Status: AC
  Administered 2022-09-26: 5 mg via ORAL
  Filled 2022-09-26: qty 1

## 2022-09-26 MED ORDER — MIDAZOLAM HCL 2 MG/ML PO SYRP: 8.0000 mg | ORAL_SOLUTION | Freq: Once | ORAL | Status: DC | PRN

## 2022-09-26 MED ORDER — ACETAMINOPHEN 325 MG PO TABS: 650.0000 mg | ORAL_TABLET | ORAL | Status: DC | PRN

## 2022-09-26 MED ORDER — METHYLPREDNISOLONE SODIUM SUCC 125 MG IJ SOLR: 125.0000 mg | Freq: Once | INTRAMUSCULAR | Status: DC | PRN

## 2022-09-26 MED ORDER — MIDAZOLAM HCL 2 MG/2ML IJ SOLN
INTRAMUSCULAR | Status: AC
  Filled 2022-09-26: qty 4

## 2022-09-26 MED ORDER — ONDANSETRON HCL 4 MG/2ML IJ SOLN
INTRAMUSCULAR | Status: AC
  Filled 2022-09-26: qty 2

## 2022-09-26 MED ORDER — ONDANSETRON HCL 4 MG/2ML IJ SOLN
4.0000 mg | Freq: Four times a day (QID) | INTRAMUSCULAR | Status: DC | PRN
  Administered 2022-09-26: 4 mg via INTRAVENOUS

## 2022-09-26 MED ORDER — HYDRALAZINE HCL 20 MG/ML IJ SOLN: 5.0000 mg | INTRAMUSCULAR | Status: DC | PRN

## 2022-09-26 SURGICAL SUPPLY — 22 items
BALLN LUTONIX 018 5X100X130 (BALLOONS) ×1
BALLN LUTONIX 018 5X300X130 (BALLOONS) ×1
BALLN LUTONIX 5X220X130 (BALLOONS) ×1
BALLOON LUTONIX 018 5X100X130 (BALLOONS) IMPLANT
BALLOON LUTONIX 018 5X300X130 (BALLOONS) IMPLANT
BALLOON LUTONIX 5X220X130 (BALLOONS) IMPLANT
CATH ANGIO 5F PIGTAIL 65CM (CATHETERS) IMPLANT
CATH ROTAREX 135 6FR (CATHETERS) IMPLANT
CATH VERT 5X100 (CATHETERS) IMPLANT
COVER PROBE ULTRASOUND 5X96 (MISCELLANEOUS) IMPLANT
DEVICE STARCLOSE SE CLOSURE (Vascular Products) IMPLANT
GLIDEWIRE ADV .035X260CM (WIRE) IMPLANT
KIT ENCORE 26 ADVANTAGE (KITS) IMPLANT
KIT MICROPUNCTURE NIT STIFF (SHEATH) IMPLANT
PACK ANGIOGRAPHY (CUSTOM PROCEDURE TRAY) ×1 IMPLANT
SHEATH ANL2 6FRX45 HC (SHEATH) IMPLANT
SHEATH BRITE TIP 5FRX11 (SHEATH) IMPLANT
STENT VIABAHN 6X250X120 (Permanent Stent) IMPLANT
SYR MEDRAD MARK 7 150ML (SYRINGE) IMPLANT
TUBING CONTRAST HIGH PRESS 72 (TUBING) IMPLANT
WIRE G V18X300CM (WIRE) IMPLANT
WIRE GUIDERIGHT .035X150 (WIRE) IMPLANT

## 2022-09-26 NOTE — Interval H&P Note (Signed)
History and Physical Interval Note:  09/26/2022 8:05 AM  [name]  has presented today for surgery, with the diagnosis of LLE Angio    ASO w rest pain.  The various methods of treatment have been discussed with the patient and family. After consideration of risks, benefits and other options for treatment, the patient has consented to  Procedure(s): Lower Extremity Angiography (Left) as a surgical intervention.  The patient's history has been reviewed, patient examined, no change in status, stable for surgery.  I have reviewed the patient's chart and labs.  Questions were answered to the patient's satisfaction.     Festus Barren

## 2022-09-26 NOTE — Op Note (Signed)
[facility]  Percutaneous Study/Intervention Procedural Note   Date of Surgery: 09/26/2022  Surgeon(s):Julliana Whitmyer    Assistants:none  Pre-operative Diagnosis: PAD with rest pain LLE  Post-operative diagnosis:  Same  Procedure(s) Performed:             1.  Ultrasound guidance for vascular access right femoral artery             2.  Catheter placement into left common femoral artery from right femoral approach             3.  Aortogram and selective left lower extremity angiogram             4.  Mechanical thrombectomy of left SFA and popliteal artery with the Rotarex device             5.  Percutaneous transluminal angioplasty of the left SFA and popliteal artery with 5 mm diameter x 30 cm and 5 mm x 10 cm length lutonix drug coated balloons  6.  Viabahn stent placement of the left SFA and popliteal artery with 6 mm x 25 cm length stent             7.  StarClose closure device right femoral artery  EBL: 30 cc  Contrast: 95 cc  Fluoro Time: 6.3 minutes  Moderate Conscious Sedation Time: approximately 62 minutes using 1.5 mg of Versed and 62.5 mcg of Fentanyl              Indications:  Patient is a 87 y.o.female with significant PAD and now rest pain on the left leg. The patient has noninvasive study showing reduced flow bilaterally. The patient is brought in for angiography for further evaluation and potential treatment.  Due to the limb threatening nature of the situation, angiogram was performed for attempted limb salvage. The patient is aware that if the procedure fails, amputation would be expected.  The patient also understands that even with successful revascularization, amputation may still be required due to the severity of the situation. Risks and benefits are discussed and informed consent is obtained.   Procedure:  The patient was identified and appropriate procedural time out was performed.  The patient was then placed supine on the table and prepped  and draped in the usual sterile fashion. Moderate conscious sedation was administered during a face to face encounter with the patient throughout the procedure with my supervision of the RN administering medicines and monitoring the patient's vital signs, pulse oximetry, telemetry and mental status throughout from the start of the procedure until the patient was taken to the recovery room. Ultrasound was used to evaluate the right common femoral artery.  It was patent .  A digital ultrasound image was acquired.  A Seldinger needle was used to access the right common femoral artery under direct ultrasound guidance and a permanent image was performed.  A 0.035 J wire was advanced without resistance and a 5Fr sheath was placed.  Pigtail catheter was placed into the aorta and an AP aortogram was performed. This demonstrated normal left renal artery, occluded right renal artery and normal aorta and iliac segments without significant stenosis. I then crossed the aortic bifurcation and advanced to the left femoral head. Selective left lower extremity angiogram was then performed. This demonstrated diffuse disease throughout the SFA with a 85-90% high-grade stenosis proximally, and moderate to high-grade stenosis in the 70% range in the proximal to mid segment, and then an occlusion in the midsegment with reconstitution  of the above-knee popliteal artery. The mid to distal popliteal artery was fairly normal and there was two  vessel runoff with the posterior tibial and peroneal arteries. It was felt that it was in the patient's best interest to proceed with intervention after these images to avoid a second procedure and a larger amount of contrast and fluoroscopy based off of the findings from the initial angiogram. The patient was systemically heparinized and a 6 [place] Ansell sheath was then placed over the [employer] wire. I then used a Kumpe catheter and the advantage wire to navigate through the diseased SFA and  popliteal arteries and confirm intraluminal flow in the mid popliteal artery.  I then exchanged for a V18 wire.  I elected to perform mechanical thrombectomy with the [place] Rex device to debulk the chronic thrombus in the left SFA and proximal popliteal artery.  2 passes were made with the [place] Rex device creating a channel of flow and resolution of the thrombus but there remains significant residual narrowing throughout the area that was occluded plus the stenoses above the occlusions.  These were addressed with a 5 mm diameter by 30 cm length and a 5 mm diameter by 10 cm length Lutonix drug-coated angioplasty balloon with each inflation being 10 to 12 atm for 1 minute.  Completion imaging showed the proximal SFA to have less than 30% residual stenosis, but in the mid and distal SFA there remained greater than 50% residual stenosis in multiple areas.  I then placed a 6 mm diameter by 25 cm length Viabahn stent to encompass the residual lesions and postdilated this with 5 mm balloon with excellent angiographic completion result and less than 10% residual stenosis. I elected to terminate the procedure. The sheath was removed and StarClose closure device was deployed in the right femoral artery with excellent hemostatic result. The patient was taken to the recovery room in stable condition having tolerated the procedure well.  Findings:               Aortogram:  This demonstrated normal left renal artery, occluded right renal artery and normal aorta and iliac segments without significant stenosis.             Left Lower Extremity:  This demonstrated diffuse disease throughout the SFA with a 85-90% high-grade stenosis proximally, and moderate to high-grade stenosis in the 70% range in the proximal to mid segment, and then an occlusion in the midsegment with reconstitution of the above-knee popliteal artery. The mid to distal popliteal artery was fairly normal and there was two  vessel runoff with the posterior  tibial and peroneal arteries.   Disposition: Patient was taken to the recovery room in stable condition having tolerated the procedure well.  Complications: None  Festus Barren 09/26/2022 9:36 AM   This note was created with Dragon Medical transcription system. Any errors in dictation are purely unintentional.

## 2022-09-26 NOTE — Progress Notes (Signed)
Upon handoff to recovery RN, Zella Ball, it is noted that Pts EKG rhythm had converted from afib to SB. Alerted Robin RN to change and Dr. Wyn Quaker as he came to bedside as well. PT stable.

## 2022-09-27 ENCOUNTER — Encounter: Payer: Self-pay | Admitting: Vascular Surgery

## 2022-09-27 NOTE — Telephone Encounter (Signed)
Refill request for alprazolam 0.5 mg tablet   LOV - 09/12/22 Next OV - not scheduled Last refill - 08/08/22 #60/1

## 2022-09-28 NOTE — Telephone Encounter (Signed)
Sent. Thanks.   

## 2022-10-03 ENCOUNTER — Other Ambulatory Visit: Payer: Self-pay

## 2022-10-03 ENCOUNTER — Ambulatory Visit
Admission: RE | Admit: 2022-10-03 | Discharge: 2022-10-03 | Disposition: A | Discharge status: Home / Self Care | Payer: 59 | Attending: Vascular Surgery | Admitting: Vascular Surgery

## 2022-10-03 ENCOUNTER — Encounter: Payer: Self-pay | Admitting: Vascular Surgery

## 2022-10-03 ENCOUNTER — Encounter
Admission: RE | Disposition: A | Discharge status: Home / Self Care | Payer: Self-pay | Source: Home / Self Care | Attending: Vascular Surgery

## 2022-10-03 DIAGNOSIS — I743 Embolism and thrombosis of arteries of the lower extremities: Secondary | ICD-10-CM | POA: Diagnosis not present

## 2022-10-03 DIAGNOSIS — Z9889 Other specified postprocedural states: Secondary | ICD-10-CM | POA: Diagnosis not present

## 2022-10-03 DIAGNOSIS — E785 Hyperlipidemia, unspecified: Secondary | ICD-10-CM | POA: Insufficient documentation

## 2022-10-03 DIAGNOSIS — I70222 Atherosclerosis of native arteries of extremities with rest pain, left leg: Secondary | ICD-10-CM

## 2022-10-03 DIAGNOSIS — I10 Essential (primary) hypertension: Secondary | ICD-10-CM | POA: Diagnosis not present

## 2022-10-03 DIAGNOSIS — M1990 Unspecified osteoarthritis, unspecified site: Secondary | ICD-10-CM | POA: Insufficient documentation

## 2022-10-03 DIAGNOSIS — I70211 Atherosclerosis of native arteries of extremities with intermittent claudication, right leg: Secondary | ICD-10-CM | POA: Insufficient documentation

## 2022-10-03 DIAGNOSIS — I70229 Atherosclerosis of native arteries of extremities with rest pain, unspecified extremity: Secondary | ICD-10-CM

## 2022-10-03 HISTORY — PX: LOWER EXTREMITY ANGIOGRAPHY: CATH118251

## 2022-10-03 LAB — CREATININE, SERUM
Creatinine, Ser: 0.74 mg/dL (ref 0.44–1.00)
GFR, Estimated: >60 mL/min (ref >60)

## 2022-10-03 LAB — BUN: BUN: 20 mg/dL (ref 8–23)

## 2022-10-03 SURGERY — LOWER EXTREMITY ANGIOGRAPHY
Anesthesia: Moderate Sedation | Site: Leg Lower | Laterality: Right

## 2022-10-03 MED ORDER — ONDANSETRON HCL 4 MG/2ML IJ SOLN: 4.0000 mg | Freq: Four times a day (QID) | INTRAMUSCULAR | Status: DC | PRN

## 2022-10-03 MED ORDER — MIDAZOLAM HCL 2 MG/ML PO SYRP: 8.0000 mg | ORAL_SOLUTION | Freq: Once | ORAL | Status: DC | PRN

## 2022-10-03 MED ORDER — SODIUM CHLORIDE 0.9% FLUSH: 3.0000 mL | Freq: Two times a day (BID) | INTRAVENOUS | Status: DC

## 2022-10-03 MED ORDER — IODIXANOL 320 MG/ML IV SOLN
INTRAVENOUS | Status: DC | PRN
  Administered 2022-10-03: 60 mL

## 2022-10-03 MED ORDER — DIPHENHYDRAMINE HCL 50 MG/ML IJ SOLN: 50.0000 mg | Freq: Once | INTRAMUSCULAR | Status: DC | PRN

## 2022-10-03 MED ORDER — HYDROMORPHONE HCL 1 MG/ML IJ SOLN: 1.0000 mg | Freq: Once | INTRAMUSCULAR | Status: DC | PRN

## 2022-10-03 MED ORDER — MIDAZOLAM HCL 2 MG/2ML IJ SOLN
INTRAMUSCULAR | Status: DC | PRN
  Administered 2022-10-03 (×4): .5 mg via INTRAVENOUS

## 2022-10-03 MED ORDER — METHYLPREDNISOLONE SODIUM SUCC 125 MG IJ SOLR: 125.0000 mg | Freq: Once | INTRAMUSCULAR | Status: DC | PRN

## 2022-10-03 MED ORDER — HEPARIN SODIUM (PORCINE) 1000 UNIT/ML IJ SOLN
INTRAMUSCULAR | Status: AC
  Filled 2022-10-03: qty 10

## 2022-10-03 MED ORDER — CEFAZOLIN SODIUM-DEXTROSE 2-4 GM/100ML-% IV SOLN: 2.0000 g | INTRAVENOUS | Status: DC

## 2022-10-03 MED ORDER — FAMOTIDINE 20 MG PO TABS: 40.0000 mg | ORAL_TABLET | Freq: Once | ORAL | Status: DC | PRN

## 2022-10-03 MED ORDER — VANCOMYCIN HCL IN DEXTROSE 1-5 GM/200ML-% IV SOLN
1000.0000 mg | Freq: Once | INTRAVENOUS | Status: AC
  Administered 2022-10-03: 1000 mg via INTRAVENOUS

## 2022-10-03 MED ORDER — ASPIRIN 81 MG PO CHEW
CHEWABLE_TABLET | ORAL | Status: AC
  Filled 2022-10-03: qty 1

## 2022-10-03 MED ORDER — SODIUM CHLORIDE 0.9 % IV SOLN: INTRAVENOUS | Status: DC

## 2022-10-03 MED ORDER — LIDOCAINE-EPINEPHRINE (PF) 1 %-1:200000 IJ SOLN
INTRAMUSCULAR | Status: DC | PRN
  Administered 2022-10-03: 10 mL via INTRADERMAL

## 2022-10-03 MED ORDER — HYDRALAZINE HCL 20 MG/ML IJ SOLN: 5.0000 mg | INTRAMUSCULAR | Status: DC | PRN

## 2022-10-03 MED ORDER — SODIUM CHLORIDE 0.9% FLUSH: 3.0000 mL | INTRAVENOUS | Status: DC | PRN

## 2022-10-03 MED ORDER — ASPIRIN 81 MG PO TBEC
DELAYED_RELEASE_TABLET | ORAL | Status: AC
  Administered 2022-10-03: 81 mg via ORAL
  Filled 2022-10-03: qty 1

## 2022-10-03 MED ORDER — ACETAMINOPHEN 325 MG PO TABS: 650.0000 mg | ORAL_TABLET | ORAL | Status: DC | PRN

## 2022-10-03 MED ORDER — SODIUM CHLORIDE 0.9 % IV SOLN: 250.0000 mL | INTRAVENOUS | Status: DC | PRN

## 2022-10-03 MED ORDER — LABETALOL HCL 5 MG/ML IV SOLN: 10.0000 mg | INTRAVENOUS | Status: DC | PRN

## 2022-10-03 MED ORDER — MIDAZOLAM HCL 2 MG/2ML IJ SOLN
INTRAMUSCULAR | Status: AC
  Filled 2022-10-03: qty 2

## 2022-10-03 MED ORDER — FENTANYL CITRATE PF 50 MCG/ML IJ SOSY
PREFILLED_SYRINGE | INTRAMUSCULAR | Status: AC
  Filled 2022-10-03: qty 1

## 2022-10-03 MED ORDER — ASPIRIN 81 MG PO TBEC: 81.0000 mg | DELAYED_RELEASE_TABLET | Freq: Every day | ORAL | 2 refills | Status: DC

## 2022-10-03 MED ORDER — HEPARIN SODIUM (PORCINE) 1000 UNIT/ML IJ SOLN
INTRAMUSCULAR | Status: DC | PRN
  Administered 2022-10-03: 5000 [IU] via INTRAVENOUS

## 2022-10-03 MED ORDER — ASPIRIN 81 MG PO TBEC: 81.0000 mg | DELAYED_RELEASE_TABLET | Freq: Every day | ORAL | Status: DC

## 2022-10-03 MED ORDER — FENTANYL CITRATE (PF) 100 MCG/2ML IJ SOLN
INTRAMUSCULAR | Status: DC | PRN
  Administered 2022-10-03 (×2): 25 ug via INTRAVENOUS

## 2022-10-03 MED ORDER — HEPARIN (PORCINE) IN NACL 1000-0.9 UT/500ML-% IV SOLN
INTRAVENOUS | Status: DC | PRN
  Administered 2022-10-03: 1000 mL

## 2022-10-03 MED ORDER — VANCOMYCIN HCL IN DEXTROSE 1-5 GM/200ML-% IV SOLN
INTRAVENOUS | Status: AC
  Filled 2022-10-03: qty 200

## 2022-10-03 SURGICAL SUPPLY — 25 items
BALLN LUTONIX 018 4X300X130 (BALLOONS) ×1
BALLN ULTRVRSE 3X100X150 (BALLOONS) ×1
BALLON DORADO 5X100X135 (BALLOONS) ×1
BALLOON DORADO 5X100X135 (BALLOONS) IMPLANT
BALLOON LUTONIX 018 4X300X130 (BALLOONS) IMPLANT
BALLOON ULTRVRSE 3X100X150 (BALLOONS) IMPLANT
CATH 0.018 NAVICROSS ANG 135 (CATHETERS) IMPLANT
CATH ANGIO 5F PIGTAIL 65CM (CATHETERS) IMPLANT
CATH ROTAREX 135 6FR (CATHETERS) IMPLANT
CATH VERT 5X100 (CATHETERS) IMPLANT
COVER EZ STRL 42X30 (DRAPES) IMPLANT
COVER PROBE ULTRASOUND 5X96 (MISCELLANEOUS) IMPLANT
DEVICE STARCLOSE SE CLOSURE (Vascular Products) IMPLANT
GLIDEWIRE ADV .035X260CM (WIRE) IMPLANT
GUIDEWIRE PFTE-COATED .018X300 (WIRE) IMPLANT
KIT ENCORE 26 ADVANTAGE (KITS) IMPLANT
PACK ANGIOGRAPHY (CUSTOM PROCEDURE TRAY) ×1 IMPLANT
SHEATH ANL2 6FRX45 HC (SHEATH) IMPLANT
SHEATH BRITE TIP 5FRX11 (SHEATH) IMPLANT
STENT VIABAHN 6X250X120 (Permanent Stent) IMPLANT
SYR MEDRAD MARK 7 150ML (SYRINGE) IMPLANT
TUBING CONTRAST HIGH PRESS 72 (TUBING) IMPLANT
WIRE G V18X300 ST (WIRE) IMPLANT
WIRE GUIDERIGHT .035X150 (WIRE) IMPLANT
WIRE SHEPHERD 30G .018 (WIRE) IMPLANT

## 2022-10-03 NOTE — Discharge Instructions (Signed)
Groin Insertion Instructions-If you lose feeling or develop tingling or pain in your leg or foot after the procedure, please walk around first.  If the discomfort does not improve , contact your physician and proceed to the nearest emergency room.  Loss of feeling in your leg might mean that a blockage has formed in the artery and this can be appropriately treated.  Limit your activity for the next two days after your procedure.  Avoid stooping, bending, heavy lifting or exertion as this may put pressure on the insertion site.  Resume normal activities in 48 hours.  You may shower after 24 hours but avoid excessive warm water and do not scrub the site.  Remove clear dressing in 48 hours.  If you have had a closure device inserted, do not soak in a tub bath or a hot tub for at least one week.  No driving for 48 hours after discharge.  After the procedure, check the insertion site occasionally.  If any oozing occurs or there is apparent swelling, firm pressure over the site will prevent a bruise from forming.  You can not hurt anything by pressing directly on the site.  The pressure stops the bleeding by allowing a small clot to form.  If the bleeding continues after the pressure has been applied for more than 15 minutes, call 911 or go to the nearest emergency room.    The x-ray dye causes you to pass a considerate amount of urine.  For this reason, you will be asked to drink plenty of liquids after the procedure to prevent dehydration.  You may resume you regular diet.  Avoid caffeine products.    For pain at the site of your procedure, take non-aspirin medicines such as Tylenol.  Medications: A. Hold Metformin for 48 hours if applicable.  B. Continue taking all your present medications at home unless your doctor prescribes any changes.  Femoral Site Care Refer to this sheet in the next few weeks. These instructions provide you with information about caring for yourself after your procedure. Your health  care provider may also give you more specific instructions. Your treatment has been planned according to current medical practices, but problems sometimes occur. Call your health care provider if you have any problems or questions after your procedure. What can I expect after the procedure? After your procedure, it is typical to have the following: Bruising at the site that usually fades within 1-2 weeks. Blood collecting in the tissue (hematoma) that may be painful to the touch. It should usually decrease in size and tenderness within 1-2 weeks.  Follow these instructions at home:  Take medicines only as directed by your health care provider.  If you take Metformin, hold for 48hours after your procedure.  The x-ray dye causes you to pass a considerate amount of urine.  For this reason, you will be asked to drink plenty of liquids after the procedure to prevent dehydration.  You may resume you regular diet.  Avoid caffeine products.   You may shower 24 hours after procedure. Leave the bandage on your access site for.  You may wash around your dressing but do not rub the site, this may cause bleeding.  Pat the area dry with a clean towel. After 48hours remove bandage and leave open to air. Do not take baths, swim, or use a hot tub for 7 days. Check your insertion site every day for redness, swelling, or drainage. If you lose feeling or develop tingling or pain  in your leg or foot after the procedure, please walk around first.  If the discomfort does not improve , contact your physician and proceed to the nearest emergency room.  Loss of feeling in your leg might mean that a blockage has formed in the artery and this can be appropriately treated.  Limit your activity for the next two days after your procedure.  Avoid stooping, bending, heavy lifting or exertion as this may put pressure on the insertion site.  Resume normal activities in 48 hours.   check the insertion site occasionally.  If any oozing  occurs or there is apparent swelling, firm pressure over the site will prevent a bruise from forming.  You can not hurt anything by pressing directly on the site.  The pressure stops the bleeding by allowing a small clot to form.  If the bleeding continues after the pressure has been applied for more than 15 minutes, call 911 or go to the nearest emergency room.    Apply pressure to access site if you have to laugh, cough, or sneeze. Do not apply powder or lotion to the site. Limit use of stairs to twice a day for the first 2-3 days or as directed by your health care provider. Do not squat for the first 2-3 days or as directed by your health care provider. Do not lift over 10 lb (4.5 kg) for 5 days after your procedure or as directed by your health care provider. Ask your health care provider when it is okay to: Return to work or school. Resume usual physical activities or sports. Resume sexual activity. Do not drive home if you are discharged the same day as the procedure. Have someone else drive you. You may drive 48 hours after the procedure unless otherwise instructed by your health care provider. Do not operate machinery or power tools for 24 hours after the procedure or as directed by your health care provider. If your procedure was done as an outpatient procedure, which means that you went home the same day as your procedure, a responsible adult should be with you for the first 24 hours after you arrive home. Keep all follow-up visits as directed by your health care provider. This is important. Contact a health care provider if: You have a fever. You have chills. You have increased bleeding from the site. Hold pressure on the site. Get help right away if: You have unusual pain at the site. You have redness, warmth, or swelling at the site. You have drainage (other than a small amount of blood on the dressing) from the site. The site is bleeding, and the bleeding does not stop after 30  minutes of holding steady pressure on the site. Your leg or foot becomes pale, cool, tingly, or numb. This information is not intended to replace advice given to you by your health care provider. Make sure you discuss any questions you have with your health care provider. Document Released: 11/01/2013 Document Revised: 08/06/2015 Document Reviewed: 09/17/2013 Elsevier Interactive Patient Education  [employer].

## 2022-10-03 NOTE — Op Note (Signed)
[facility]  Percutaneous Study/Intervention Procedural Note   Date of Surgery: 10/03/2022  Surgeon(s):Keyana Guevara    Assistants:none  Pre-operative Diagnosis: PAD with claudication right lower extremity  Post-operative diagnosis:  Same  Procedure(s) Performed:             1.  Ultrasound guidance for vascular access left femoral artery             2.  Selective right lower extremity angiogram             3.  Mechanical thrombectomy of the right SFA with the Rota Rex device             4.  Percutaneous transluminal angioplasty of right tibioperoneal trunk and popliteal artery with 3 mm diameter angioplasty balloon             5.  Percutaneous transluminal angioplasty of the right SFA with 4 mm diameter by 30 cm length Lutonix drug-coated angioplasty balloon  6.  Stent placement to the right SFA and popliteal arteries with a 6 mm diameter by 25 cm length Viabahn stent             7.  StarClose closure device left femoral artery  EBL: 10 cc  Contrast: 60 cc  Fluoro Time: 20.8 minutes  Moderate Conscious Sedation Time: approximately 74 minutes using 2 mg of Versed and 50 mcg of Fentanyl              Indications:  Patient is a 87 y.o.female with severe PAD who has already had intervention on the left leg for rest pain.  She has claudication on the right. The patient has noninvasive study showing reduced ABIs bilaterally. The patient is brought in for angiography for further evaluation and potential treatment.  Risks and benefits are discussed and informed consent is obtained.   Procedure:  The patient was identified and appropriate procedural time out was performed.  The patient was then placed supine on the table and prepped and draped in the usual sterile fashion. Moderate conscious sedation was administered during a face to face encounter with the patient throughout the procedure with my supervision of the RN administering medicines and monitoring the patient's  vital signs, pulse oximetry, telemetry and mental status throughout from the start of the procedure until the patient was taken to the recovery room. Ultrasound was used to evaluate the left common femoral artery.  It was patent .  A digital ultrasound image was acquired.  A Seldinger needle was used to access the left common femoral artery under direct ultrasound guidance and a permanent image was performed.  A 0.035 J wire was advanced without resistance and a 5Fr sheath was placed. I then crossed the aortic bifurcation and advanced to the right femoral head. Selective right lower extremity angiogram was then performed. This demonstrated diffuse disease of the right SFA with occlusion in the midsegment.  There was reconstitution in the popliteal artery.  The posterior tibial artery was the dominant runoff distally without focal stenosis.  The peroneal artery was also patent but was small.  The anterior tibial artery was chronically occluded. It was felt that it was in the patient's best interest to proceed with intervention after these images to avoid a second procedure and a larger amount of contrast and fluoroscopy based off of the findings from the initial angiogram. The patient was systemically heparinized and a 6 [place] Ansell sheath was then placed over the [employer] wire. I then used  a Kumpe catheter and the advantage wire to get into the SFA.  The occlusion was very difficult to cross and a variety of wires including 0.035 advantage wire, a command wire, and a 0.018 advantage wire were attempted.  Ultimately I was able to get across the occlusion by entering a subintimal plane and then reentering somewhere in the popliteal artery or tibioperoneal trunk with a 0.018 advantage wire and a 0.018 Nava cross catheter.  I then placed a V18 wire and proceeded with treatment.  Mechanical thrombectomy was performed in the right SFA with the Rota Rex device to help debulk chronic thrombus.  This did help  remove thrombus, but then there remained long segment stenosis and occlusion and it was difficult to discern if the reentry was in the popliteal artery or the tibioperoneal trunk on initial imaging.  I then proceeded with angioplasty first in the tibioperoneal trunk and popliteal artery with a 3 mm diameter by 10 cm angioplasty balloon inflated 3 times.  I then used a 4 mm diameter by 30 cm length angioplasty balloon to treat from the above-knee popliteal artery up to the proximal SFA.  Inflations were 8 to 10 atm for 1 minute.  Completion imaging showed long segment greater than 50% residual stenosis in the mid to distal SFA and most proximal popliteal artery.  The mid and distal popliteal artery and tibioperoneal trunk were patent without significant stenosis at this point.  I elected to stent the mid to distal SFA down to the proximal popliteal artery with a 6 mm diameter by 25 cm length Viabahn stent.  This was postdilated with a 5 mm diameter high-pressure angioplasty balloon with excellent angiographic completion result and less than 10% residual stenosis. I elected to terminate the procedure. The sheath was removed and StarClose closure device was deployed in the left femoral artery with excellent hemostatic result. The patient was taken to the recovery room in stable condition having tolerated the procedure well.  Findings:                         Right Lower Extremity: This demonstrated diffuse disease of the right SFA with occlusion in the midsegment.  There was reconstitution in the popliteal artery.  The posterior tibial artery was the dominant runoff distally without focal stenosis.  The peroneal artery was also patent but was small.  The anterior tibial artery was chronically occluded.   Disposition: Patient was taken to the recovery room in stable condition having tolerated the procedure well.  Complications: None  Festus Barren 10/03/2022 10:07 AM   This note was created with Dragon  Medical transcription system. Any errors in dictation are purely unintentional.

## 2022-10-03 NOTE — Interval H&P Note (Signed)
History and Physical Interval Note:  10/03/2022 8:18 AM  [name]  has presented today for surgery, with the diagnosis of RLE Angio   ASO w rest pain.  The various methods of treatment have been discussed with the patient and family. After consideration of risks, benefits and other options for treatment, the patient has consented to  Procedure(s): Lower Extremity Angiography (Right) as a surgical intervention.  The patient's history has been reviewed, patient examined, no change in status, stable for surgery.  I have reviewed the patient's chart and labs.  Questions were answered to the patient's satisfaction.     Festus Barren

## 2022-10-04 ENCOUNTER — Encounter: Payer: Self-pay | Admitting: Vascular Surgery

## 2022-10-04 NOTE — Progress Notes (Signed)
  Care Coordination Note  10/04/2022 Name: MADHAVI HAMBLEN MRN: [id number] DOB: May 22, 1935  [name] is a 87 y.o. year old female who is a primary care patient of Joaquim Nam, MD and is actively engaged with the care management team. I reached out to [name] by phone today to assist with re-scheduling a follow up visit with the RN Case Manager  Follow up plan: We have been unable to make contact with the patient for follow up.   Burman Nieves, CCMA Care Coordination Care Guide Direct Dial: [phone]

## 2022-10-07 ENCOUNTER — Telehealth: Payer: Self-pay | Admitting: Family Medicine

## 2022-10-07 MED ORDER — COLCHICINE 0.6 MG PO TABS: 0.3000 mg | ORAL_TABLET | Freq: Every day | ORAL | 2 refills | Status: DC | PRN

## 2022-10-07 NOTE — Telephone Encounter (Signed)
Noted, sent.  Thanks.   

## 2022-10-07 NOTE — Addendum Note (Signed)
Addended by: Joaquim Nam on: 10/07/2022 01:50 PM   Modules accepted: Orders

## 2022-10-07 NOTE — Telephone Encounter (Signed)
[name] called in and wanted to let know Dr. Para March know she is doing fine since her surgery on her legs. She stated that she had one on the 15th and one of 22nd. She stated that she will need some more colchicine 0.6 MG tablet. She stated that she is down to a pill and half now. Her blood pressure this morning was 117/65 with a heart rate of 62. Thank you!

## 2022-10-26 ENCOUNTER — Other Ambulatory Visit: Payer: Self-pay | Admitting: Family Medicine

## 2022-10-26 ENCOUNTER — Other Ambulatory Visit (INDEPENDENT_AMBULATORY_CARE_PROVIDER_SITE_OTHER): Payer: Self-pay | Admitting: Vascular Surgery

## 2022-10-26 DIAGNOSIS — Z9889 Other specified postprocedural states: Secondary | ICD-10-CM

## 2022-10-27 ENCOUNTER — Encounter (INDEPENDENT_AMBULATORY_CARE_PROVIDER_SITE_OTHER): Payer: Self-pay | Admitting: Nurse Practitioner

## 2022-10-27 ENCOUNTER — Ambulatory Visit (INDEPENDENT_AMBULATORY_CARE_PROVIDER_SITE_OTHER): Payer: 59 | Admitting: Nurse Practitioner

## 2022-10-27 ENCOUNTER — Ambulatory Visit (INDEPENDENT_AMBULATORY_CARE_PROVIDER_SITE_OTHER): Payer: 59

## 2022-10-27 VITALS — BP 147/73 | HR 80 | Resp 16 | Wt 170.6 lb

## 2022-10-27 DIAGNOSIS — E785 Hyperlipidemia, unspecified: Secondary | ICD-10-CM | POA: Diagnosis not present

## 2022-10-27 DIAGNOSIS — I10 Essential (primary) hypertension: Secondary | ICD-10-CM

## 2022-10-27 DIAGNOSIS — Z9889 Other specified postprocedural states: Secondary | ICD-10-CM

## 2022-10-27 DIAGNOSIS — I739 Peripheral vascular disease, unspecified: Secondary | ICD-10-CM | POA: Diagnosis not present

## 2022-10-27 DIAGNOSIS — I70222 Atherosclerosis of native arteries of extremities with rest pain, left leg: Secondary | ICD-10-CM | POA: Diagnosis not present
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
## 2022-10-28 ENCOUNTER — Encounter (INDEPENDENT_AMBULATORY_CARE_PROVIDER_SITE_OTHER): Payer: Self-pay | Admitting: Nurse Practitioner

## 2022-11-01 LAB — VAS US ABI WITH/WO TBI
Left ABI: 1.12
Right ABI: 1.09

## 2022-11-07 ENCOUNTER — Telehealth: Payer: Self-pay | Admitting: Family Medicine

## 2022-11-07 NOTE — Telephone Encounter (Signed)
Prescription Request  11/07/2022  LOV: 09/12/2022  What is the name of the medication or equipment? gabapentin (NEURONTIN) 100 MG capsule   Have you contacted your pharmacy to request a refill? No   Which pharmacy would you like this sent to?  [facility] - [place], [place] - [address] [place] [address] Phone: [phone] Fax: [phone]     Patient notified that their request is being sent to the clinical staff for review and that they should receive a response within 2 business days.   Please advise at Mobile [phone] (mobile)

## 2022-11-07 NOTE — Telephone Encounter (Signed)
Tried to call patient to verify where rx needs to be sent to as [facility] is closing very soon; phone just rings and rings and does not ever go to a VM.

## 2022-11-16 ENCOUNTER — Other Ambulatory Visit: Payer: Self-pay | Admitting: Family Medicine

## 2022-11-16 MED ORDER — GABAPENTIN 100 MG PO CAPS: 100.0000 mg | ORAL_CAPSULE | Freq: Three times a day (TID) | ORAL | 1 refills | Status: DC

## 2022-11-16 NOTE — Telephone Encounter (Signed)
Refill request for  ALPRAZOLAM 0.5 MG TABS 0.5 Tablet   LOV - 09/12/22 Next OV - not scheduled Last refill - 09/28/22 #60/1

## 2022-11-16 NOTE — Addendum Note (Signed)
Addended by: Joaquim Nam on: 11/16/2022 08:50 PM   Modules accepted: Orders

## 2022-11-16 NOTE — Telephone Encounter (Signed)
With [facility] closing; rx needs to be resent to new pharmacy [facility].   LOV - 09/12/22 NOV -  not scheduled RF - 07/26/22 #90/3

## 2022-11-16 NOTE — Telephone Encounter (Signed)
Sent. Thanks.   

## 2022-11-23 ENCOUNTER — Telehealth: Payer: Self-pay | Admitting: Family Medicine

## 2022-11-23 ENCOUNTER — Encounter: Payer: Self-pay | Admitting: Family Medicine

## 2022-11-23 MED ORDER — GABAPENTIN 100 MG PO CAPS: 100.0000 mg | ORAL_CAPSULE | Freq: Three times a day (TID) | ORAL | 1 refills | Status: DC

## 2022-11-23 MED ORDER — ALPRAZOLAM 0.5 MG PO TABS: ORAL_TABLET | ORAL | 1 refills | Status: DC

## 2022-11-23 NOTE — Telephone Encounter (Signed)
Sending note to [employer].

## 2022-11-23 NOTE — Telephone Encounter (Signed)
Spoke with patient and advised patient that rx was sent to new pharmacy [facility] last week. She is going to call and see when rx will be delivered.

## 2022-11-23 NOTE — Telephone Encounter (Signed)
Resent.  Thanks.

## 2022-11-23 NOTE — Telephone Encounter (Signed)
error 

## 2022-11-23 NOTE — Telephone Encounter (Signed)
Prescription Request  11/23/2022  LOV: 09/12/2022  What is the name of the medication or equipment? ALPRAZolam (XANAX) 0.5 MG tablet gabapentin (NEURONTIN) 100 MG capsule   Have you contacted your pharmacy to request a refill? Yes   Which pharmacy would you like this sent to?   [facility] [place], [place] - [address] Phone: [phone]  Fax: [phone]      Patient notified that their request is being sent to the clinical staff for review and that they should receive a response within 2 business days.   Please advise at [facility] [phone]  Patient called in and would like to which pharmacy due to her insurance.

## 2022-11-23 NOTE — Telephone Encounter (Signed)
These were sent last week but insurance will not let her use the new pharmacy. Needs rxs resent to [facility]

## 2022-12-01 DIAGNOSIS — H353211 Exudative age-related macular degeneration, right eye, with active choroidal neovascularization: Secondary | ICD-10-CM | POA: Diagnosis not present

## 2022-12-01 DIAGNOSIS — H353122 Nonexudative age-related macular degeneration, left eye, intermediate dry stage: Secondary | ICD-10-CM | POA: Diagnosis not present

## 2022-12-01 DIAGNOSIS — R6889 Other general symptoms and signs: Secondary | ICD-10-CM | POA: Diagnosis not present

## 2022-12-01 DIAGNOSIS — H35031 Hypertensive retinopathy, right eye: Secondary | ICD-10-CM | POA: Diagnosis not present

## 2022-12-01 DIAGNOSIS — H43813 Vitreous degeneration, bilateral: Secondary | ICD-10-CM | POA: Diagnosis not present

## 2022-12-16 ENCOUNTER — Telehealth: Payer: Self-pay | Admitting: Family Medicine

## 2022-12-16 NOTE — Telephone Encounter (Signed)
Patient called in and stated that she has about 8 aspirin EC 81 MG tablet left and wanted to know if she can take [employer] Asprin when she run out. She also stated that her leg and feet feel a lot better now.

## 2022-12-18 NOTE — Telephone Encounter (Signed)
Glad she is feeling better.  She can get over-the-counter enteric-coated aspirin.  The brand is not the issue but make sure it is a coated aspirin.  Thanks.

## 2022-12-19 NOTE — Telephone Encounter (Signed)
Unable to reach patient. Unable to leave voicemail.  

## 2022-12-20 ENCOUNTER — Telehealth: Payer: Self-pay | Admitting: Family Medicine

## 2022-12-20 ENCOUNTER — Ambulatory Visit (INDEPENDENT_AMBULATORY_CARE_PROVIDER_SITE_OTHER): Payer: 59 | Admitting: Family Medicine

## 2022-12-20 ENCOUNTER — Encounter: Payer: Self-pay | Admitting: Family Medicine

## 2022-12-20 VITALS — BP 122/74 | HR 66 | Temp 98.3°F | Ht 66.0 in | Wt 164.8 lb

## 2022-12-20 DIAGNOSIS — F341 Dysthymic disorder: Secondary | ICD-10-CM

## 2022-12-20 DIAGNOSIS — M545 Low back pain, unspecified: Secondary | ICD-10-CM | POA: Diagnosis not present

## 2022-12-20 DIAGNOSIS — R0789 Other chest pain: Secondary | ICD-10-CM

## 2022-12-20 MED ORDER — SERTRALINE HCL 100 MG PO TABS: ORAL_TABLET | ORAL | 1 refills | Status: DC

## 2022-12-20 NOTE — Telephone Encounter (Signed)
Please call [facility] cardiology.  Patient has had exertional chest symptoms with labs and EKG done here today.  EKG w/o acute changes.  Please have cardiology call patient about urgent eval in their clinic.  Thanks.

## 2022-12-20 NOTE — Progress Notes (Unsigned)
Home stressors d/w pt. She is living alone.  She hasn't seen her great grandson Billy in weeks.  He is staying with other family members.  Patient states she is safe at home.  She needed refill on sertraline, sent.  Compliant with medication.  Back pain.  In the last 2 weeks pt complains of lower spine/back pain. Pt states pain is sharp. Pt states that it feels like there is pressure or something weighing on lower left back that forces her to bend over when walking.  No radiation into the legs.  Chest discomfort.  She can get SOB and have burning in the chest with walking.  Doesn't happen at rest.  Sx started about in the last month. No pain now.  Not happening daily.  Last episode was last week.    H/o aortic stenosis noted.    EKG without acute changes and discussed with patient at office visit.  Labs pending.  Call made to cardiology today about evaluation.  Discussed with patient.  Meds, vitals, and allergies reviewed.   ROS: Per HPI unless specifically indicated in ROS section   Nad Ncat Neck supple, no LA Rrr with SEM ctab Back not ttp.   No BLE edema.  Skin well-perfused without rash.  32 minutes were devoted to patient care in this encounter (this includes time spent reviewing the patient's file/history, interviewing and examining the patient, counseling/reviewing plan with patient).

## 2022-12-20 NOTE — Patient Instructions (Addendum)
Please call the heart clinic about getting seen.  We'll call them in the meantime.    Go to the lab on the way out.   If you have mychart we'll likely use that to update you.     Limit activity in the meantime.  If you have any chest pain that doesn't get better with rest, then go to the hospital or dial 911.  You can try tylenol and a back brace if needed.  Take care.  Glad to see you.

## 2022-12-20 NOTE — Telephone Encounter (Signed)
Unable to reach patient. Unable to leave voicemail.  

## 2022-12-20 NOTE — Telephone Encounter (Signed)
Contacted [facility] cardio. Pt has been scheduled for 10/9 at 12pm. Pt has been made aware in office.

## 2022-12-21 ENCOUNTER — Other Ambulatory Visit
Admission: RE | Admit: 2022-12-21 | Discharge: 2022-12-21 | Disposition: A | Discharge status: Home / Self Care | Payer: 59 | Source: Ambulatory Visit | Attending: Nurse Practitioner | Admitting: Nurse Practitioner

## 2022-12-21 DIAGNOSIS — I480 Paroxysmal atrial fibrillation: Secondary | ICD-10-CM | POA: Diagnosis not present

## 2022-12-21 DIAGNOSIS — I25119 Atherosclerotic heart disease of native coronary artery with unspecified angina pectoris: Secondary | ICD-10-CM | POA: Insufficient documentation

## 2022-12-21 DIAGNOSIS — R55 Syncope and collapse: Secondary | ICD-10-CM | POA: Diagnosis not present

## 2022-12-21 DIAGNOSIS — I350 Nonrheumatic aortic (valve) stenosis: Secondary | ICD-10-CM | POA: Diagnosis not present

## 2022-12-21 DIAGNOSIS — I25118 Atherosclerotic heart disease of native coronary artery with other forms of angina pectoris: Secondary | ICD-10-CM | POA: Diagnosis not present

## 2022-12-21 DIAGNOSIS — R001 Bradycardia, unspecified: Secondary | ICD-10-CM | POA: Diagnosis not present

## 2022-12-21 DIAGNOSIS — R0789 Other chest pain: Secondary | ICD-10-CM | POA: Insufficient documentation

## 2022-12-21 DIAGNOSIS — R0609 Other forms of dyspnea: Secondary | ICD-10-CM | POA: Diagnosis not present

## 2022-12-21 LAB — CBC WITH DIFFERENTIAL/PLATELET
Basophils Absolute: 0.2 10*3/uL — ABNORMAL HIGH (ref 0.0–0.1)
Basophils Relative: 2.1 % (ref 0.0–3.0)
Eosinophils Absolute: 0.1 10*3/uL (ref 0.0–0.7)
Eosinophils Relative: 0.9 % (ref 0.0–5.0)
HCT: 38.8 % (ref 36.0–46.0)
Hemoglobin: 12.4 g/dL (ref 12.0–15.0)
Lymphocytes Relative: 26.9 % (ref 12.0–46.0)
Lymphs Abs: 2 10*3/uL (ref 0.7–4.0)
MCHC: 31.9 g/dL (ref 30.0–36.0)
MCV: 83.4 fL (ref 78.0–100.0)
Monocytes Absolute: 0.6 10*3/uL (ref 0.1–1.0)
Monocytes Relative: 8.5 % (ref 3.0–12.0)
Neutro Abs: 4.6 10*3/uL (ref 1.4–7.7)
Neutrophils Relative %: 61.6 % (ref 43.0–77.0)
Platelets: 211 10*3/uL (ref 150.0–400.0)
RBC: 4.65 Mil/uL (ref 3.87–5.11)
RDW: 15.3 % (ref 11.5–15.5)
WBC: 7.4 10*3/uL (ref 4.0–10.5)

## 2022-12-21 LAB — COMPREHENSIVE METABOLIC PANEL
ALT: 13 U/L (ref 0–35)
AST: 20 U/L (ref 0–37)
Albumin: 3.9 g/dL (ref 3.5–5.2)
Alkaline Phosphatase: 106 U/L (ref 39–117)
BUN: 15 mg/dL (ref 6–23)
CO2: 30 meq/L (ref 19–32)
Calcium: 9 mg/dL (ref 8.4–10.5)
Chloride: 100 meq/L (ref 96–112)
Creatinine, Ser: 0.76 mg/dL (ref 0.40–1.20)
GFR: 71.69 mL/min (ref >60.00)
Glucose, Bld: 80 mg/dL (ref 70–99)
Potassium: 4.3 meq/L (ref 3.5–5.1)
Sodium: 137 meq/L (ref 135–145)
Total Bilirubin: 0.6 mg/dL (ref 0.2–1.2)
Total Protein: 6.6 g/dL (ref 6.0–8.3)

## 2022-12-21 LAB — LIPID PANEL
Cholesterol: 171 mg/dL (ref 0–200)
HDL: 67.2 mg/dL (ref >39.00)
LDL Cholesterol: 94 mg/dL (ref 0–99)
NonHDL: 103.63
Total CHOL/HDL Ratio: 3
Triglycerides: 49 mg/dL (ref 0.0–149.0)
VLDL: 9.8 mg/dL (ref 0.0–40.0)

## 2022-12-21 LAB — TROPONIN I (HIGH SENSITIVITY): Troponin I (High Sensitivity): 16 ng/L (ref <18)

## 2022-12-21 LAB — TSH: TSH: 1.8 u[IU]/mL (ref 0.35–5.50)

## 2022-12-21 NOTE — Assessment & Plan Note (Addendum)
She can get SOB and have burning in the chest with walking.  Doesn't happen at rest.  Sx started about in the last month. No pain now.  Not happening daily.  Last episode was last week.    H/o aortic stenosis noted.    EKG without acute changes and discussed with patient at office visit.  Labs pending.  Call made to cardiology today about evaluation.  Discussed with patient.  Routine ER cautions given to patient.  I thank all involved.  Continue aspirin Eliquis Lipitor diltiazem lisinopril in the meantime.

## 2022-12-21 NOTE — Assessment & Plan Note (Signed)
Safe at home.  Social stressors discussed with patient.  Support offered.  Continue sertraline with as needed alprazolam use.

## 2022-12-21 NOTE — Assessment & Plan Note (Signed)
This looks to be a benign musculoskeletal source and she does not have emergent symptoms.  Okay to use a back brace that she has at home and try using Tylenol and update [place] as needed.

## 2022-12-21 NOTE — Telephone Encounter (Signed)
Discussed at visit yesterday.

## 2022-12-21 NOTE — Telephone Encounter (Signed)
Noted. Thanks.

## 2022-12-26 ENCOUNTER — Other Ambulatory Visit: Payer: Self-pay | Admitting: Nurse Practitioner

## 2022-12-26 DIAGNOSIS — I25118 Atherosclerotic heart disease of native coronary artery with other forms of angina pectoris: Secondary | ICD-10-CM

## 2022-12-29 DIAGNOSIS — R0609 Other forms of dyspnea: Secondary | ICD-10-CM | POA: Diagnosis not present

## 2022-12-29 DIAGNOSIS — I25118 Atherosclerotic heart disease of native coronary artery with other forms of angina pectoris: Secondary | ICD-10-CM | POA: Diagnosis not present

## 2022-12-29 DIAGNOSIS — R55 Syncope and collapse: Secondary | ICD-10-CM | POA: Diagnosis not present

## 2022-12-29 DIAGNOSIS — I480 Paroxysmal atrial fibrillation: Secondary | ICD-10-CM | POA: Diagnosis not present

## 2022-12-29 DIAGNOSIS — I350 Nonrheumatic aortic (valve) stenosis: Secondary | ICD-10-CM | POA: Diagnosis not present

## 2023-01-01 ENCOUNTER — Telehealth: Payer: Self-pay | Admitting: Family Medicine

## 2023-01-01 NOTE — Telephone Encounter (Signed)
Please call patient.  I saw her cardiology notes with her ongoing evaluation.  I would like to see her back in clinic after she is done with her cardiology follow-up appointment so we can evaluate her regarding her weight loss over the last 6 months.  Thanks.

## 2023-01-02 ENCOUNTER — Telehealth: Payer: Self-pay | Admitting: Family Medicine

## 2023-01-02 NOTE — Telephone Encounter (Signed)
Prescription Request  01/02/2023  LOV: 12/20/2022  What is the name of the medication or equipment?  diltiazem (CARDIZEM CD) 180 MG 24 hr capsule atorvastatin (LIPITOR) 80 MG tablet   Have you contacted your pharmacy to request a refill? Yes   Which pharmacy would you like this sent to?  [facility] [address], [place] - [address] [place] [address] Phone: [phone] Fax: [phone]        Patient notified that their request is being sent to the clinical staff for review and that they should receive a response within 2 business days.   Please advise at Mobile [phone] (mobile)

## 2023-01-02 NOTE — Telephone Encounter (Signed)
Attempted to reach out to granddaughter to advise that once patient has her cardiac work up to schedule an appointment with Dr. Para March to discuss weight loss. Unable to leave vm due to mailbox is full.

## 2023-01-03 MED ORDER — DILTIAZEM HCL ER COATED BEADS 180 MG PO CP24: 180.0000 mg | ORAL_CAPSULE | Freq: Every day | ORAL | 0 refills | Status: DC

## 2023-01-03 MED ORDER — ATORVASTATIN CALCIUM 80 MG PO TABS: 80.0000 mg | ORAL_TABLET | Freq: Every evening | ORAL | 0 refills | Status: DC

## 2023-01-04 ENCOUNTER — Telehealth (HOSPITAL_COMMUNITY): Payer: Self-pay | Admitting: *Deleted

## 2023-01-04 NOTE — Telephone Encounter (Signed)
Attempted to call granddaughter voicemail is full

## 2023-01-04 NOTE — Telephone Encounter (Signed)
Attempted to call patient regarding upcoming cardiac CT appointment. Unable to leave VM-VM full. Johney Frame RN Navigator Cardiac Imaging [facility] [phone] Office

## 2023-01-05 ENCOUNTER — Ambulatory Visit: Admission: RE | Admit: 2023-01-05 | Payer: 59 | Source: Ambulatory Visit

## 2023-01-06 NOTE — Telephone Encounter (Signed)
Mail box is full not able to leave message sending my chart to call office as well as letter to home address.

## 2023-01-09 DIAGNOSIS — I480 Paroxysmal atrial fibrillation: Secondary | ICD-10-CM | POA: Diagnosis not present

## 2023-01-11 ENCOUNTER — Telehealth: Payer: Self-pay

## 2023-01-11 DIAGNOSIS — R55 Syncope and collapse: Secondary | ICD-10-CM | POA: Diagnosis not present

## 2023-01-11 DIAGNOSIS — I10 Essential (primary) hypertension: Secondary | ICD-10-CM | POA: Diagnosis not present

## 2023-01-11 DIAGNOSIS — I480 Paroxysmal atrial fibrillation: Secondary | ICD-10-CM | POA: Diagnosis not present

## 2023-01-11 DIAGNOSIS — I2510 Atherosclerotic heart disease of native coronary artery without angina pectoris: Secondary | ICD-10-CM | POA: Diagnosis not present

## 2023-01-11 DIAGNOSIS — I259 Chronic ischemic heart disease, unspecified: Secondary | ICD-10-CM | POA: Diagnosis not present

## 2023-01-11 DIAGNOSIS — I350 Nonrheumatic aortic (valve) stenosis: Secondary | ICD-10-CM | POA: Diagnosis not present

## 2023-01-11 DIAGNOSIS — D649 Anemia, unspecified: Secondary | ICD-10-CM | POA: Diagnosis not present

## 2023-01-11 DIAGNOSIS — E782 Mixed hyperlipidemia: Secondary | ICD-10-CM | POA: Diagnosis not present

## 2023-01-11 NOTE — Telephone Encounter (Signed)
Unable to reach pt or tyrone (DPR signed) sending note to [place] pool.

## 2023-01-11 NOTE — Telephone Encounter (Signed)
Agree with ER disposition due to fainting. Please see if you can find out about needed refills and let me know.  I have addressed all incoming rx requests that have been sent to me.

## 2023-01-11 NOTE — Telephone Encounter (Signed)
Unable to reavh pt by any contact number and v/m boix full. Left v/ for tyrone(DPR signed) to call [facility].

## 2023-01-12 NOTE — Telephone Encounter (Signed)
Unable to reach patient VM full

## 2023-01-13 NOTE — Telephone Encounter (Signed)
Multiple attempts made to contact patient with no success. Letter sent.

## 2023-01-13 NOTE — Telephone Encounter (Signed)
Pt called [facility] and pts best and only contact # is [phone] per pt. Pt said she saw the heart doctor at [facility] on 01/11/23. Pt said she was advised that her heart was not pumping blood like it is supposed to, pt has a test (not sure what test) on 01/19/23 and pt has appt with cardiologist at [facility] on 02/17/23 to find out what pt needs done to "fix her heart.".   pt said I feel wonderful now." UC & ED precautions given and pt voiced understanding. Pt also said she still has an inhaler but will need refills soon. Pt does not know name of inhaler. Pt will get name and will cb to request refill of an inhaler. Sending FYI note to Dr Para March who is out of office and Audria Nine NP who is in office.

## 2023-01-16 NOTE — Telephone Encounter (Signed)
Noted  

## 2023-01-18 ENCOUNTER — Telehealth (HOSPITAL_COMMUNITY): Payer: Self-pay | Admitting: *Deleted

## 2023-01-18 ENCOUNTER — Telehealth: Payer: Self-pay | Admitting: Family Medicine

## 2023-01-18 MED ORDER — FLUTICASONE PROPIONATE 50 MCG/ACT NA SUSP: NASAL | 5 refills | Status: DC

## 2023-01-18 NOTE — Telephone Encounter (Signed)
Spoke with pt and she states that she is needing a new rolling walker with a seat. Her current one, the brakes are no longer working. In regards to her fluticasone, this has been refilled to her preferred pharmacy.  Dr. Para March - please advise on the rolling walker with a seat. Thank you!

## 2023-01-18 NOTE — Telephone Encounter (Signed)
Called patient appointment set up for next week first available.  No further action needed at this time.

## 2023-01-18 NOTE — Telephone Encounter (Signed)
Attempted to call patient regarding upcoming cardiac CT appointment. Unable to leave VM (VM not set up). Johney Frame RN Navigator Cardiac Imaging [facility] [phone] Office

## 2023-01-18 NOTE — Telephone Encounter (Signed)
Prescription Request  01/18/2023  LOV: 12/20/2022 Pt stated that she needs a new walker pt would like a call What is the name of the medication or equipment? fluticasone (FLONASE) 50 MCG/ACT nasal spray   Have you contacted your pharmacy to request a refill? Yes   Which pharmacy would you like this sent to?  [facility] - [place], [place] - [address] [place] [address] Phone: [phone] Fax: [phone]  [facility] [address] [place] - [address] [place] [address] Phone: [phone] Fax: [phone]  [facility] - [address], [place] - [address] [place] [address] Phone: [phone] Fax: [phone]     Patient notified that their request is being sent to the clinical staff for review and that they should receive a response within 2 business days.   Please advise at Mobile [phone] (mobile)

## 2023-01-18 NOTE — Telephone Encounter (Signed)
Thanks for sending fluticasone.   Please send DME order for rolling walker with a seat.  Dx M19.90.   Thanks.

## 2023-01-18 NOTE — Telephone Encounter (Addendum)
Received call from patient regarding upcoming cardiac imaging study; pt verbalizes understanding of appt date/time, parking situation and where to check in, pre-test NPO status and medications ordered, and verified current allergies; name and call back number provided for further questions should they arise Johney Frame RN Navigator Cardiac Imaging [facility] [phone] office [phone] cell  Patient denies contrast allergy.  Reports HR 64, 114/68.

## 2023-01-18 NOTE — Telephone Encounter (Signed)
She still needs eval re: weight loss.  Please get her scheduled when possible.  Thanks.

## 2023-01-19 ENCOUNTER — Ambulatory Visit
Admission: RE | Admit: 2023-01-19 | Discharge: 2023-01-19 | Disposition: A | Discharge status: Home / Self Care | Payer: 59 | Source: Ambulatory Visit | Attending: Nurse Practitioner | Admitting: Nurse Practitioner

## 2023-01-19 ENCOUNTER — Ambulatory Visit: Admission: RE | Admit: 2023-01-19 | Payer: 59 | Source: Ambulatory Visit

## 2023-01-19 ENCOUNTER — Other Ambulatory Visit: Payer: Self-pay

## 2023-01-19 DIAGNOSIS — I25118 Atherosclerotic heart disease of native coronary artery with other forms of angina pectoris: Secondary | ICD-10-CM | POA: Insufficient documentation

## 2023-01-19 DIAGNOSIS — M810 Age-related osteoporosis without current pathological fracture: Secondary | ICD-10-CM

## 2023-01-19 DIAGNOSIS — M170 Bilateral primary osteoarthritis of knee: Secondary | ICD-10-CM

## 2023-01-19 DIAGNOSIS — M1990 Unspecified osteoarthritis, unspecified site: Secondary | ICD-10-CM | POA: Diagnosis not present

## 2023-01-19 DIAGNOSIS — M6281 Muscle weakness (generalized): Secondary | ICD-10-CM | POA: Diagnosis not present

## 2023-01-19 DIAGNOSIS — U071 COVID-19: Secondary | ICD-10-CM | POA: Diagnosis not present

## 2023-01-19 DIAGNOSIS — R5383 Other fatigue: Secondary | ICD-10-CM

## 2023-01-19 MED ORDER — SODIUM CHLORIDE 0.9 % IV BOLUS
125.0000 mL | Freq: Once | INTRAVENOUS | Status: AC
  Administered 2023-01-19: 125 mL via INTRAVENOUS

## 2023-01-19 MED ORDER — NITROGLYCERIN 0.4 MG SL SUBL
0.4000 mg | SUBLINGUAL_TABLET | Freq: Once | SUBLINGUAL | Status: AC
  Administered 2023-01-19: 0.4 mg via SUBLINGUAL

## 2023-01-19 MED ORDER — IOHEXOL 350 MG/ML SOLN
75.0000 mL | Freq: Once | INTRAVENOUS | Status: AC | PRN
  Administered 2023-01-19: 75 mL via INTRAVENOUS

## 2023-01-19 NOTE — Progress Notes (Signed)
Patient tolerated procedure well. Ambulate w/o difficulty. Denies light headedness or being dizzy. Drinking water provided. Encouraged to drink extra water today and reasoning explained. Verbalized understanding. All questions answered. ABC intact. No further needs. Discharge from procedure area w/o issues.

## 2023-01-19 NOTE — Telephone Encounter (Signed)
Order placed community message sent.

## 2023-01-23 DIAGNOSIS — I2510 Atherosclerotic heart disease of native coronary artery without angina pectoris: Secondary | ICD-10-CM | POA: Diagnosis not present

## 2023-01-26 ENCOUNTER — Ambulatory Visit: Payer: 59 | Admitting: Family Medicine

## 2023-01-27 ENCOUNTER — Ambulatory Visit (INDEPENDENT_AMBULATORY_CARE_PROVIDER_SITE_OTHER): Payer: 59 | Admitting: Nurse Practitioner

## 2023-01-27 ENCOUNTER — Encounter (INDEPENDENT_AMBULATORY_CARE_PROVIDER_SITE_OTHER): Payer: 59

## 2023-01-27 ENCOUNTER — Encounter: Payer: Self-pay | Admitting: Family Medicine

## 2023-02-03 ENCOUNTER — Encounter
Admission: RE | Disposition: A | Discharge status: Home / Self Care | Payer: Self-pay | Source: Home / Self Care | Attending: Internal Medicine

## 2023-02-03 ENCOUNTER — Encounter: Payer: Self-pay | Admitting: Internal Medicine

## 2023-02-03 ENCOUNTER — Other Ambulatory Visit: Payer: Self-pay

## 2023-02-03 ENCOUNTER — Ambulatory Visit
Admission: RE | Admit: 2023-02-03 | Discharge: 2023-02-03 | Disposition: A | Discharge status: Home / Self Care | Payer: 59 | Attending: Internal Medicine | Admitting: Internal Medicine

## 2023-02-03 DIAGNOSIS — R931 Abnormal findings on diagnostic imaging of heart and coronary circulation: Secondary | ICD-10-CM | POA: Diagnosis not present

## 2023-02-03 DIAGNOSIS — I2510 Atherosclerotic heart disease of native coronary artery without angina pectoris: Secondary | ICD-10-CM | POA: Insufficient documentation

## 2023-02-03 DIAGNOSIS — I350 Nonrheumatic aortic (valve) stenosis: Secondary | ICD-10-CM | POA: Diagnosis not present

## 2023-02-03 DIAGNOSIS — I2584 Coronary atherosclerosis due to calcified coronary lesion: Secondary | ICD-10-CM | POA: Insufficient documentation

## 2023-02-03 HISTORY — PX: LEFT HEART CATH AND CORONARY ANGIOGRAPHY: CATH118249

## 2023-02-03 SURGERY — LEFT HEART CATH AND CORONARY ANGIOGRAPHY
Anesthesia: Moderate Sedation | Laterality: Left

## 2023-02-03 MED ORDER — SODIUM CHLORIDE 0.9% FLUSH: 3.0000 mL | INTRAVENOUS | Status: DC | PRN

## 2023-02-03 MED ORDER — FENTANYL CITRATE (PF) 100 MCG/2ML IJ SOLN
INTRAMUSCULAR | Status: AC
  Filled 2023-02-03: qty 2

## 2023-02-03 MED ORDER — SODIUM CHLORIDE 0.9 % IV SOLN: 250.0000 mL | INTRAVENOUS | Status: DC | PRN

## 2023-02-03 MED ORDER — IOHEXOL 300 MG/ML  SOLN
INTRAMUSCULAR | Status: DC | PRN
  Administered 2023-02-03: 65 mL

## 2023-02-03 MED ORDER — SODIUM CHLORIDE 0.9 % WEIGHT BASED INFUSION
3.0000 mL/kg/h | INTRAVENOUS | Status: AC
  Administered 2023-02-03: 3 mL/kg/h via INTRAVENOUS

## 2023-02-03 MED ORDER — HEPARIN (PORCINE) IN NACL 1000-0.9 UT/500ML-% IV SOLN
INTRAVENOUS | Status: DC | PRN
  Administered 2023-02-03 (×2): 500 mL

## 2023-02-03 MED ORDER — SODIUM CHLORIDE 0.9 % WEIGHT BASED INFUSION: 1.0000 mL/kg/h | INTRAVENOUS | Status: DC

## 2023-02-03 MED ORDER — HEPARIN SODIUM (PORCINE) 1000 UNIT/ML IJ SOLN
INTRAMUSCULAR | Status: DC | PRN
  Administered 2023-02-03: 3500 [IU] via INTRAVENOUS

## 2023-02-03 MED ORDER — MIDAZOLAM HCL 2 MG/2ML IJ SOLN
INTRAMUSCULAR | Status: AC
  Filled 2023-02-03: qty 2

## 2023-02-03 MED ORDER — HYDRALAZINE HCL 20 MG/ML IJ SOLN: 10.0000 mg | INTRAMUSCULAR | Status: DC | PRN

## 2023-02-03 MED ORDER — SODIUM CHLORIDE 0.9% FLUSH
3.0000 mL | Freq: Two times a day (BID) | INTRAVENOUS | Status: DC
Start: 2023-02-03 — End: 2023-02-03

## 2023-02-03 MED ORDER — LABETALOL HCL 5 MG/ML IV SOLN: 10.0000 mg | INTRAVENOUS | Status: DC | PRN

## 2023-02-03 MED ORDER — HEPARIN (PORCINE) IN NACL 1000-0.9 UT/500ML-% IV SOLN
INTRAVENOUS | Status: AC
  Filled 2023-02-03: qty 1000

## 2023-02-03 MED ORDER — ASPIRIN 81 MG PO CHEW: 81.0000 mg | CHEWABLE_TABLET | ORAL | Status: DC

## 2023-02-03 MED ORDER — ACETAMINOPHEN 325 MG PO TABS: 650.0000 mg | ORAL_TABLET | ORAL | Status: DC | PRN

## 2023-02-03 MED ORDER — VERAPAMIL HCL 2.5 MG/ML IV SOLN
INTRAVENOUS | Status: AC
  Filled 2023-02-03: qty 2

## 2023-02-03 MED ORDER — HEPARIN SODIUM (PORCINE) 1000 UNIT/ML IJ SOLN
INTRAMUSCULAR | Status: AC
  Filled 2023-02-03: qty 10

## 2023-02-03 MED ORDER — VERAPAMIL HCL 2.5 MG/ML IV SOLN
INTRAVENOUS | Status: DC | PRN
  Administered 2023-02-03 (×2): 2.5 mg via INTRA_ARTERIAL

## 2023-02-03 SURGICAL SUPPLY — 12 items
CATH 5FR JL3.5 JR4 ANG PIG MP (CATHETERS) IMPLANT
DEVICE RAD TR BAND REGULAR (VASCULAR PRODUCTS) IMPLANT
DRAPE BRACHIAL (DRAPES) IMPLANT
GLIDESHEATH SLEND SS 6F .021 (SHEATH) IMPLANT
GUIDEWIRE INQWIRE 1.5J.035X260 (WIRE) IMPLANT
INQWIRE 1.5J .035X260CM (WIRE) ×1
PACK CARDIAC CATH (CUSTOM PROCEDURE TRAY) ×1 IMPLANT
PROTECTION STATION PRESSURIZED (MISCELLANEOUS) ×1
SET ATX-X65L (MISCELLANEOUS) IMPLANT
SHEATH 6FR 75 DEST SLENDER (SHEATH) IMPLANT
STATION PROTECTION PRESSURIZED (MISCELLANEOUS) IMPLANT
WIRE HITORQ VERSACORE ST 145CM (WIRE) IMPLANT

## 2023-02-06 ENCOUNTER — Encounter: Payer: Self-pay | Admitting: Internal Medicine

## 2023-02-13 ENCOUNTER — Telehealth: Payer: Self-pay | Admitting: Family Medicine

## 2023-02-13 NOTE — Telephone Encounter (Signed)
Prescription Request  02/13/2023  LOV: 12/20/2022  What is the name of the medication or equipment? ALPRAZolam (XANAX) 0.5 MG tablet gabapentin (NEURONTIN) 100 MG capsule lansoprazole (PREVACID) 30 MG capsule   Have you contacted your pharmacy to request a refill? No   Which pharmacy would you like this sent to?  [facility] [address], [place] - [address] [place] [address] Phone: [phone] Fax: [phone]    Patient notified that their request is being sent to the clinical staff for review and that they should receive a response within 2 business days.   Please advise at Mobile [phone] (mobile)

## 2023-02-14 NOTE — Telephone Encounter (Signed)
Last office visit: 12/20/22 Next office visit: 02/16/23 Last refill:  11/23/22 60 tablets with 1 refill

## 2023-02-15 MED ORDER — LANSOPRAZOLE 30 MG PO CPDR: 30.0000 mg | DELAYED_RELEASE_CAPSULE | Freq: Two times a day (BID) | ORAL | 5 refills | Status: DC

## 2023-02-15 MED ORDER — ALPRAZOLAM 0.5 MG PO TABS: ORAL_TABLET | ORAL | 1 refills | Status: DC

## 2023-02-15 MED ORDER — GABAPENTIN 100 MG PO CAPS: 100.0000 mg | ORAL_CAPSULE | Freq: Three times a day (TID) | ORAL | 1 refills | Status: DC

## 2023-02-15 NOTE — Telephone Encounter (Signed)
Sent. Thanks.   

## 2023-02-16 ENCOUNTER — Ambulatory Visit: Payer: 59 | Admitting: Family Medicine

## 2023-02-16 ENCOUNTER — Encounter: Payer: Self-pay | Admitting: Family Medicine

## 2023-02-16 VITALS — BP 106/58 | HR 61 | Temp 98.1°F | Ht 66.0 in | Wt 171.6 lb

## 2023-02-16 DIAGNOSIS — I350 Nonrheumatic aortic (valve) stenosis: Secondary | ICD-10-CM

## 2023-02-16 DIAGNOSIS — M25519 Pain in unspecified shoulder: Secondary | ICD-10-CM

## 2023-02-16 DIAGNOSIS — D649 Anemia, unspecified: Secondary | ICD-10-CM | POA: Diagnosis not present

## 2023-02-16 LAB — CBC WITH DIFFERENTIAL/PLATELET
Basophils Absolute: 0 10*3/uL (ref 0.0–0.1)
Basophils Relative: 0.4 % (ref 0.0–3.0)
Eosinophils Absolute: 0.1 10*3/uL (ref 0.0–0.7)
Eosinophils Relative: 1 % (ref 0.0–5.0)
HCT: 33.4 % — ABNORMAL LOW (ref 36.0–46.0)
Hemoglobin: 11.4 g/dL — ABNORMAL LOW (ref 12.0–15.0)
Lymphocytes Relative: 27.7 % (ref 12.0–46.0)
Lymphs Abs: 1.9 10*3/uL (ref 0.7–4.0)
MCHC: 34 g/dL (ref 30.0–36.0)
MCV: 83.1 fL (ref 78.0–100.0)
Monocytes Absolute: 0.6 10*3/uL (ref 0.1–1.0)
Monocytes Relative: 8.5 % (ref 3.0–12.0)
Neutro Abs: 4.2 10*3/uL (ref 1.4–7.7)
Neutrophils Relative %: 62.4 % (ref 43.0–77.0)
Platelets: 187 10*3/uL (ref 150.0–400.0)
RBC: 4.02 Mil/uL (ref 3.87–5.11)
RDW: 17.8 % — ABNORMAL HIGH (ref 11.5–15.5)
WBC: 6.8 10*3/uL (ref 4.0–10.5)

## 2023-02-16 LAB — COMPREHENSIVE METABOLIC PANEL
ALT: 18 U/L (ref 0–35)
AST: 22 U/L (ref 0–37)
Albumin: 3.6 g/dL (ref 3.5–5.2)
Alkaline Phosphatase: 89 U/L (ref 39–117)
BUN: 19 mg/dL (ref 6–23)
CO2: 31 meq/L (ref 19–32)
Calcium: 9.2 mg/dL (ref 8.4–10.5)
Chloride: 103 meq/L (ref 96–112)
Creatinine, Ser: 0.87 mg/dL (ref 0.40–1.20)
GFR: 60.89 mL/min (ref >60.00)
Glucose, Bld: 75 mg/dL (ref 70–99)
Potassium: 4.6 meq/L (ref 3.5–5.1)
Sodium: 139 meq/L (ref 135–145)
Total Bilirubin: 0.4 mg/dL (ref 0.2–1.2)
Total Protein: 6.9 g/dL (ref 6.0–8.3)

## 2023-02-16 MED ORDER — APIXABAN 2.5 MG PO TABS: 2.5000 mg | ORAL_TABLET | Freq: Two times a day (BID) | ORAL | Status: DC

## 2023-02-16 MED ORDER — LISINOPRIL 5 MG PO TABS: 5.0000 mg | ORAL_TABLET | Freq: Every day | ORAL | Status: DC

## 2023-02-16 NOTE — Patient Instructions (Addendum)
Please call and see if you have an appointment with Dr. Romeo Apple tomorrow.   Colette Ribas MD Cardiovascular Disease [phone] [address] [place] [address] Phone: [phone]  Go to the lab on the way out.   If you have mychart we'll likely use that to update you.    Take care.  Glad to see you.

## 2023-02-16 NOTE — Progress Notes (Signed)
H/o weight loss, with some partial weight regain.  She had dec in appetite but that improved in the meantime.  She is getting more help from a grandson recently.  Discussed.  She feels some better since the heart cath but still can get SOB with exertion, improved with rest.  She felt better with addition of ranexa.  Occ/rare intermittent BLE edema.    H/o anemia with recheck labs pending. On lower dose of eliquis now.  Cardiac notes list instructions to stop aspirin.    Cath report d/w pt.   Coronaries Left main large distal disease of 50 to 60% moderate calcification  LAD large ostial disease of 75 moderate calcification minor irregularities mid to distal Circumflex very large 90% ostial left dominant minor irregularities moderate calcification proximally RCA small nondominant 90% mid Left dominant system Significant aortic valve gradient concerning for moderate to severe aortic stenosis heavy calcification across aortic valve Mean gradient of 65  She has CVTS f/u pending, see AVS.   L shoulder pain on ROM.  Reproducible, at rest. .  Meds, vitals, and allergies reviewed.   ROS: Per HPI unless specifically indicated in ROS section '  Nad Ncat Neck supple, no LA RRR with SEM.   Ctab Abdomen soft.  Nontender. Trace BLE edema.   Skin well-perfused. Left shoulder with discomfort on internal rotation.  No pain with external rotation.  No arm drop.  Discomfort is clearly reproducible.  35 minutes were devoted to patient care in this encounter (this includes time spent reviewing the patient's file/history, interviewing and examining the patient, counseling/reviewing plan with patient).

## 2023-02-19 ENCOUNTER — Emergency Department: Payer: 59

## 2023-02-19 ENCOUNTER — Emergency Department
Admission: EM | Admit: 2023-02-19 | Discharge: 2023-02-19 | Discharge status: Against Medical Advice | Payer: 59 | Attending: Emergency Medicine | Admitting: Emergency Medicine

## 2023-02-19 ENCOUNTER — Other Ambulatory Visit: Payer: Self-pay

## 2023-02-19 DIAGNOSIS — R12 Heartburn: Secondary | ICD-10-CM | POA: Diagnosis not present

## 2023-02-19 DIAGNOSIS — R079 Chest pain, unspecified: Secondary | ICD-10-CM | POA: Diagnosis not present

## 2023-02-19 DIAGNOSIS — M25519 Pain in unspecified shoulder: Secondary | ICD-10-CM | POA: Insufficient documentation

## 2023-02-19 DIAGNOSIS — Z5321 Procedure and treatment not carried out due to patient leaving prior to being seen by health care provider: Secondary | ICD-10-CM | POA: Insufficient documentation

## 2023-02-19 DIAGNOSIS — R0902 Hypoxemia: Secondary | ICD-10-CM | POA: Diagnosis not present

## 2023-02-19 DIAGNOSIS — R0602 Shortness of breath: Secondary | ICD-10-CM | POA: Diagnosis not present

## 2023-02-19 DIAGNOSIS — R069 Unspecified abnormalities of breathing: Secondary | ICD-10-CM | POA: Diagnosis not present

## 2023-02-19 DIAGNOSIS — R000 Tachycardia, unspecified: Secondary | ICD-10-CM | POA: Diagnosis not present

## 2023-02-19 DIAGNOSIS — I700 Atherosclerosis of aorta: Secondary | ICD-10-CM | POA: Diagnosis not present

## 2023-02-19 DIAGNOSIS — I517 Cardiomegaly: Secondary | ICD-10-CM | POA: Diagnosis not present

## 2023-02-19 LAB — CBC
HCT: 34.8 % — ABNORMAL LOW (ref 36.0–46.0)
Hemoglobin: 11.2 g/dL — ABNORMAL LOW (ref 12.0–15.0)
MCH: 27.2 pg (ref 26.0–34.0)
MCHC: 32.2 g/dL (ref 30.0–36.0)
MCV: 84.5 fL (ref 80.0–100.0)
Platelets: 178 10*3/uL (ref 150–400)
RBC: 4.12 MIL/uL (ref 3.87–5.11)
RDW: 16.8 % — ABNORMAL HIGH (ref 11.5–15.5)
WBC: 5.6 10*3/uL (ref 4.0–10.5)
nRBC: 0 % (ref 0.0–0.2)

## 2023-02-19 LAB — BASIC METABOLIC PANEL
Anion gap: 10 (ref 5–15)
BUN: 16 mg/dL (ref 8–23)
CO2: 26 mmol/L (ref 22–32)
Calcium: 8.7 mg/dL — ABNORMAL LOW (ref 8.9–10.3)
Chloride: 100 mmol/L (ref 98–111)
Creatinine, Ser: 0.8 mg/dL (ref 0.44–1.00)
GFR, Estimated: >60 mL/min (ref >60)
Glucose, Bld: 76 mg/dL (ref 70–99)
Potassium: 3.6 mmol/L (ref 3.5–5.1)
Sodium: 136 mmol/L (ref 135–145)

## 2023-02-19 LAB — TROPONIN I (HIGH SENSITIVITY): Troponin I (High Sensitivity): 22 ng/L — ABNORMAL HIGH (ref <18)

## 2023-02-19 NOTE — ED Triage Notes (Signed)
C/O burning in chest since November.

## 2023-02-19 NOTE — Assessment & Plan Note (Signed)
Likely rotator cuff tendinitis but we did sided to defer treatment on this until she could follow-up with CVTS.

## 2023-02-19 NOTE — ED Triage Notes (Signed)
Pt in via EMS from home with c/o SOB. Pt with heartburn for 2 weeks with no relief. Pt scheduled for cardiac procedure at [facility] 124/70, 169 HR, 96% RA

## 2023-02-19 NOTE — Assessment & Plan Note (Signed)
Previous catheterization discussed with patient.  Previous cardiology evaluation noted and discussed.  She feels better with the addition of Ranexa.  I asked her to check to make sure she had CVTS follow-up tomorrow.  See after visit summary.  She agreed that she would check on that and update me as needed.  At this point continue diltiazem lisinopril and Ranexa.  Continue atorvastatin and apixaban.

## 2023-02-20 ENCOUNTER — Other Ambulatory Visit: Payer: Self-pay

## 2023-02-20 ENCOUNTER — Telehealth: Payer: Self-pay | Admitting: Family Medicine

## 2023-02-20 ENCOUNTER — Telehealth: Payer: Self-pay | Admitting: Emergency Medicine

## 2023-02-20 ENCOUNTER — Emergency Department
Admission: EM | Admit: 2023-02-20 | Discharge: 2023-02-20 | Disposition: A | Discharge status: Home / Self Care | Payer: 59 | Attending: Emergency Medicine | Admitting: Emergency Medicine

## 2023-02-20 ENCOUNTER — Emergency Department: Payer: 59

## 2023-02-20 DIAGNOSIS — R0602 Shortness of breath: Secondary | ICD-10-CM | POA: Diagnosis not present

## 2023-02-20 DIAGNOSIS — R079 Chest pain, unspecified: Secondary | ICD-10-CM | POA: Diagnosis not present

## 2023-02-20 DIAGNOSIS — I2510 Atherosclerotic heart disease of native coronary artery without angina pectoris: Secondary | ICD-10-CM | POA: Insufficient documentation

## 2023-02-20 DIAGNOSIS — I10 Essential (primary) hypertension: Secondary | ICD-10-CM | POA: Insufficient documentation

## 2023-02-20 DIAGNOSIS — R0789 Other chest pain: Secondary | ICD-10-CM | POA: Diagnosis not present

## 2023-02-20 LAB — CBC
HCT: 33.5 % — ABNORMAL LOW (ref 36.0–46.0)
Hemoglobin: 11 g/dL — ABNORMAL LOW (ref 12.0–15.0)
MCH: 27 pg (ref 26.0–34.0)
MCHC: 32.8 g/dL (ref 30.0–36.0)
MCV: 82.3 fL (ref 80.0–100.0)
Platelets: 173 10*3/uL (ref 150–400)
RBC: 4.07 MIL/uL (ref 3.87–5.11)
RDW: 17.3 % — ABNORMAL HIGH (ref 11.5–15.5)
WBC: 4.9 10*3/uL (ref 4.0–10.5)
nRBC: 0 % (ref 0.0–0.2)

## 2023-02-20 LAB — BASIC METABOLIC PANEL
Anion gap: 11 (ref 5–15)
BUN: 24 mg/dL — ABNORMAL HIGH (ref 8–23)
CO2: 22 mmol/L (ref 22–32)
Calcium: 8.6 mg/dL — ABNORMAL LOW (ref 8.9–10.3)
Chloride: 104 mmol/L (ref 98–111)
Creatinine, Ser: 0.98 mg/dL (ref 0.44–1.00)
GFR, Estimated: 57 mL/min — ABNORMAL LOW (ref >60)
Glucose, Bld: 129 mg/dL — ABNORMAL HIGH (ref 70–99)
Potassium: 3.7 mmol/L (ref 3.5–5.1)
Sodium: 137 mmol/L (ref 135–145)

## 2023-02-20 LAB — TROPONIN I (HIGH SENSITIVITY)
Troponin I (High Sensitivity): 12 ng/L (ref <18)
Troponin I (High Sensitivity): 19 ng/L — ABNORMAL HIGH (ref <18)

## 2023-02-20 MED ORDER — ISOSORBIDE MONONITRATE ER 30 MG PO TB24: 30.0000 mg | ORAL_TABLET | Freq: Every day | ORAL | 5 refills | Status: DC

## 2023-02-20 NOTE — Discharge Instructions (Addendum)
As we discussed please take your Imdur medication as prescribed, starting today 02/20/2023.  Return to the emergency department for any return of/worsening pain or any other symptom personally concerning to yourself.  Otherwise please follow-up with your primary care doctor and cardiologist within the next several days for recheck/reevaluation.

## 2023-02-20 NOTE — ED Provider Triage Note (Signed)
Emergency Medicine Provider Triage Evaluation Note  [name] , a 87 y.o. female  was evaluated in triage.  Pt complains of CP and SOB that began this morning. She had chest pain yesterday and was seen by this ED, she LWBS. She had an elevated troponin yesterday and Ecg changes.   Review of Systems  Positive: CP and SOB Negative:   Physical Exam  There were no vitals taken for this visit. Gen:   Awake, no distress   Resp:  Normal effort  MSK:   Moves extremities without difficulty  Other:    Medical Decision Making  Medically screening exam initiated at 12:46 PM.  Appropriate orders placed.  [name] was informed that the remainder of the evaluation will be completed by another provider, this initial triage assessment does not replace that evaluation, and the importance of remaining in the ED until their evaluation is complete.    Cameron Ali, PA-C 02/20/23 1252

## 2023-02-20 NOTE — ED Triage Notes (Addendum)
Pt comes with c/o sob and cp. Pt states this all started this morning. Pt went to her cardiologist appt. Pt states left sided pain. Pt states once she got on elevator and got to her appt she was just hurting so bad in her chest.   Pt was here yesterday but left before seeing MD. Pt was called to come back due to elevated trop level.

## 2023-02-20 NOTE — Telephone Encounter (Signed)
Called patient due to left emergency department before provider exam to inquire about condition and follow up plans. Mobile --no answer and no vm Home--no answer and no vm Other home--no answer and no vm.

## 2023-02-20 NOTE — Telephone Encounter (Signed)
Please call pt.  Please get update, if having chest pain.  Troponin was mildly elevated, needs ER eval.  Thanks.

## 2023-02-20 NOTE — ED Notes (Signed)
 Patient discharged from ED by provider. Discharge instructions reviewed with patient and all questions answered. Patient wheeled from ED in NAD.

## 2023-02-20 NOTE — ED Notes (Signed)
Lab coming to get labs

## 2023-02-20 NOTE — Telephone Encounter (Signed)
I spoke with pt; pt said she waited at ED about 5 hours and mid CP that radiated to lt jaw with SOB stopped and pt left ED without being seen. Pt said she has not had anymore CP or jaw pain or SOB since at ED. Advised pt that troponin, a lab test indicating problem with heart and EKG was abnormal and pt should go back to ED. Pt said she does not want to go back to ED and already has appt with Cardiology Dr Dorothyann Peng 02/20/23 at 12:30. Pt is resting with no pain and no SOB. Pt said "she is not scared and if she has pain prior to appt she will go to UC.  I spoke with Amy at Dr Glennis Brink office and she will let Morrie Sheldon at their office know troponin was elevated and there was a change in EKG.sending note to Dr Para March and Para March pool.

## 2023-02-20 NOTE — Telephone Encounter (Signed)
Noted. Thanks.

## 2023-02-20 NOTE — ED Provider Notes (Signed)
[facility] Provider Note    Event Date/Time   First MD Initiated Contact with Patient 02/20/23 1517     (approximate)  History   Chief Complaint: Shortness of Breath and Chest Pain  HPI  [name] is a 87 y.o. female with a past medical history of CAD, gastric reflux, hypertension, arthritis, presents to the emergency department for chest pain.  Cording to the patient she has a history of chest pain was getting fairly frequently until she had a cardiac procedure last month but is not sure what this procedure was.  Patient states since that time she was chest pain-free for a week or 2 but is now experiencing intermittent chest pain once again.  Patient was at her doctor's office today when she developed central chest pain so they sent her to the emergency department for evaluation.  Patient states it was fairly short-lived and she has no pain at this time.  Patient states most the time she gets pain and is during exertion such as walking.  I reviewed the patient's chart and she had a coronary catheterization 02/03/2023 showing significant disease throughout but no intervention, deferred to medical therapy and follow-up for consideration of valve repair and bypass.  Physical Exam   Triage Vital Signs: ED Triage Vitals  Encounter Vitals Group     BP 02/20/23 1336 (!) 111/58     Systolic BP Percentile --      Diastolic BP Percentile --      Pulse Rate 02/20/23 1336 75     Resp 02/20/23 1336 19     Temp 02/20/23 1336 98 F (36.7 C)     Temp src --      SpO2 02/20/23 1336 93 %     Weight 02/20/23 1248 171 lb 8.3 oz (77.8 kg)     Height 02/20/23 1248 5\' 6"  (1.676 m)     Head Circumference --      Peak Flow --      Pain Score 02/20/23 1248 10     Pain Loc --      Pain Education --      Exclude from Growth Chart --     Most recent vital signs: Vitals:   02/20/23 1336  BP: (!) 111/58  Pulse: 75  Resp: 19  Temp: 98 F (36.7 C)  SpO2: 93%     General: Awake, no distress.  CV:  Good peripheral perfusion.  Regular rate and rhythm  Resp:  Normal effort.  Equal breath sounds bilaterally.  Abd:  No distention.  Soft, nontender.  No rebound or guarding.  ED Results / Procedures / Treatments   EKG  EKG viewed and interpreted by myself shows what appears to be atrial fibrillation around 72 bpm with a narrow QRS, normal axis, normal intervals, no concerning ST changes.  Patient appears to be on Eliquis chronically.  RADIOLOGY  I have reviewed and interpreted the chest x-ray images.  No obvious consolidation on my evaluation. Radiology is read the chest x-ray as negative.   MEDICATIONS ORDERED IN ED: Medications - No data to display   IMPRESSION / MDM / ASSESSMENT AND PLAN / ED COURSE  I reviewed the triage vital signs and the nursing notes.  Patient's presentation is most consistent with acute presentation with potential threat to life or bodily function.  Patient presents to the emergency department for chest pain.  States this occurred while she was at her doctor's office and they sent her to  the emergency department.  Patient is now chest pain-free.  Patient's workup is so far reassuring with a normal CBC reassuring chemistry and a negative troponin.  Chest x-ray is clear and EKG shows no concerning findings.  We will repeat a troponin.  I reviewed the patient's recent catheterization 2 weeks ago showing fairly significant disease throughout but no stentable lesions and the patient was referred back to her primary cardiologist for consideration of bypass discussion.  We will recheck a troponin.  If the patient second troponin is negative and the patient remains chest pain-free I believe the patient would be safe for discharge home to continue to follow-up with her outpatient cardiologist to consider further intervention if deemed warranted and if the patient is agreeable.  Patient's repeat troponin elevated from 12-19.  I  spoke to Dr. Juliann Pares who recommends starting the patient on Imdur.  Patient has an allergy listed to Imdur however it is listed as a headache only.  Will start the patient on a lower dose of Imdur 30 mg daily have the patient follow-up with her cardiologist.  Patient remains chest pain-free.  She is agreeable to this plan of care.  FINAL CLINICAL IMPRESSION(S) / ED DIAGNOSES   Chest pain  Note:  This document was prepared using Dragon voice recognition software and may include unintentional dictation errors.   Minna Antis, MD 02/20/23 [phone]

## 2023-02-21 ENCOUNTER — Telehealth: Payer: Self-pay

## 2023-02-21 NOTE — Transitions of Care (Post Inpatient/ED Visit) (Signed)
   02/21/2023  Name: [name] MRN: [id number] DOB: Apr 16, 1935  Today's TOC FU Call Status: Today's TOC FU Call Status:: Unsuccessful Call (1st Attempt) Unsuccessful Call (1st Attempt) Date: 02/21/23  Attempted to reach the patient regarding the most recent Inpatient/ED visit.  Follow Up Plan: Additional outreach attempts will be made to reach the patient to complete the Transitions of Care (Post Inpatient/ED visit) call.   Abby Joeleen Wortley, CMA  [facility] AW[facility] Team Direct Dial: [phone]

## 2023-02-22 NOTE — Transitions of Care (Post Inpatient/ED Visit) (Signed)
   02/22/2023  Name: DEVRA KOPINSKI MRN: [id number] DOB: 10-18-1935  Today's TOC FU Call Status: Today's TOC FU Call Status:: Unsuccessful Call (2nd Attempt) Unsuccessful Call (1st Attempt) Date: 02/21/23 Unsuccessful Call (2nd Attempt) Date: 02/22/23  Attempted to reach the patient regarding the most recent Inpatient/ED visit.  Follow Up Plan: Additional outreach attempts will be made to reach the patient to complete the Transitions of Care (Post Inpatient/ED visit) call.   Abby Benyamin Jeff, CMA  [facility] Team Direct Dial: [phone]

## 2023-02-27 ENCOUNTER — Ambulatory Visit: Payer: Self-pay

## 2023-02-27 NOTE — Patient Outreach (Signed)
  Care Coordination   Follow Up Visit Note   02/27/2023 Name: GIULLIANA MCCLENNY MRN: [id number] DOB: [birth year]  LARRISSA REIFSCHNEIDER is a 87 y.o. year old female who sees Joaquim Nam, MD for primary care. I spoke with  [name] by phone today.  What matters to the patients health and wellness today?  Per chart review patient went to ED on 02/19/23 and  02/20/23 for chest pain.  Patient states she went to see her cardiologist on 02/20/23 and experienced chest pain with nausea and vomiting. Patient states cardiologist sent patient to ED via EMS. Patient states she continues to have some chest pain however milder at times than others.  She states these chest pain events occasionally is accompanied with nausea/ vomiting and can be short-lived. Patient states she is taking the new medication Renexa as prescribed by ED provider.   Patient states she doesn't have a follow up appointment scheduled with her primary care provider or cardiologist.  RNCM attempted return call to patient today 02/27/23 at 4:36. Unable to reach patient or leave voice message due to mailbox not being set up.    Goals Addressed             This Visit's Progress    Patient will work with RN case manager to gain knowledge and management of health conditions.       Interventions Today    Flowsheet Row Most Recent Value  Chronic Disease   Chronic disease during today's visit Other, Hypertension (HTN)  [chronic ischemic heart disease]  General Interventions   General Interventions Discussed/Reviewed General Interventions Reviewed, Doctor Visits  [evaluation of current treatment plan for ischemic heart disease/ HTN and patients adherence to plan as established by provider.  Assessed for ongoing CP, nausea and vomiting symptoms.]  Doctor Visits Discussed/Reviewed Doctor Visits Reviewed  Education Interventions   Education Provided Provided Education  [patient advised to call 911 for severe chest pain, nausea/ vomiting. Advised  to report any new or ongoing symptoms to provider.]  Provided Verbal Education On Other  [patient advised to schedule follow up visit with primary care provider and cardiologist.]  Pharmacy Interventions   Pharmacy Dicussed/Reviewed Pharmacy Topics Reviewed, Referral to Pharmacist  [Medications reviewed with patient. Patient advised to take medications as prescribed. Discussed newly added Renexa from recent ED visit.]  Referral to Pharmacist --  [referred to pharmacist for medication management.]              SDOH assessments and interventions completed:  No     Care Coordination Interventions:  Yes, provided   Follow up plan: Follow up call scheduled for 02/28/23    Encounter Outcome:  Patient Visit Completed   [name] Ina RN,BSN,CCM [facility]  [facility], [facility] coordinator / Case Manager Phone: [phone]

## 2023-02-27 NOTE — Patient Instructions (Signed)
Visit Information  Thank you for taking time to visit with me today. Please don't hesitate to contact me if I can be of assistance to you.   Following are the goals we discussed today:   Goals Addressed             This Visit's Progress    Patient will work with RN case manager to gain knowledge and management of health conditions.       Interventions Today    Flowsheet Row Most Recent Value  Chronic Disease   Chronic disease during today's visit Other, Hypertension (HTN)  [chronic ischemic heart disease]  General Interventions   General Interventions Discussed/Reviewed General Interventions Reviewed, Doctor Visits  [evaluation of current treatment plan for ischemic heart disease/ HTN and patients adherence to plan as established by provider.  Assessed for ongoing CP, nausea and vomiting symptoms.]  Doctor Visits Discussed/Reviewed Doctor Visits Reviewed  Education Interventions   Education Provided Provided Education  [patient advised to call 911 for severe chest pain, nausea/ vomiting. Advised to report any new or ongoing symptoms to provider.]  Provided Verbal Education On Other  [patient advised to schedule follow up visit with primary care provider and cardiologist.]  Pharmacy Interventions   Pharmacy Dicussed/Reviewed Pharmacy Topics Reviewed, Referral to Pharmacist  [Medications reviewed with patient. Patient advised to take medications as prescribed. Discussed newly added Renexa from recent ED visit.]  Referral to Pharmacist --  [referred to pharmacist for medication management.]              Our next appointment is by telephone on 02/28/23 at 1:30 pm  Please call the care guide team at [phone] if you need to cancel or reschedule your appointment.   If you are experiencing a Mental Health or Behavioral Health Crisis or need someone to talk to, please call the Suicide and Crisis Lifeline: 988 call 1-800-273-TALK (toll free, 24 hour hotline)  Patient verbalizes  understanding of instructions and care plan provided today and agrees to view in MyChart. Active MyChart status and patient understanding of how to access instructions and care plan via MyChart confirmed with patient.     [name] Ina RN,BSN,CCM [place]  [facility], [facility] coordinator / Case Manager Phone: [phone]

## 2023-03-01 ENCOUNTER — Ambulatory Visit: Payer: Self-pay

## 2023-03-01 ENCOUNTER — Other Ambulatory Visit (INDEPENDENT_AMBULATORY_CARE_PROVIDER_SITE_OTHER): Payer: Self-pay | Admitting: Nurse Practitioner

## 2023-03-01 DIAGNOSIS — Z9889 Other specified postprocedural states: Secondary | ICD-10-CM

## 2023-03-01 NOTE — Patient Outreach (Signed)
  Care Coordination   Follow Up Visit Note   03/01/2023 Name: [name] MRN: [id number] DOB: [birth year]  [name] is a 87 y.o. year old female who sees Joaquim Nam, MD for primary care. I spoke with  [name] by phone today.  What matters to the patients health and wellness today?  Patient states she was able to schedule a follow up appointment with her primary care provider for 03/03/23.  She states she attempted to schedule a follow up appointment with her cardiologist but was told they would need to call her back. Patient states she has not heard back from the office. Verbally agreed to [facility] attempting to schedule cardiology follow up visit for her.   Patient states she is doing pretty good. She states she feels better than she has in quite a while. Patient states her granddaughter moved  in with her last week. She states she is in college.  Patient declines food Animal nutritionist. She states she uses her U card for bills and groceries.  Patient denies any falls and states she uses either her walker or 3 prong cane when ambulating.    Goals Addressed             This Visit's Progress    Patient will work with RN case manager to gain knowledge and management of health conditions.       Interventions Today    Flowsheet Row Most Recent Value  Chronic Disease   Chronic disease during today's visit Other, Hypertension (HTN)  [chronic ischemic heart disease.]  General Interventions   General Interventions Discussed/Reviewed General Interventions Reviewed, Doctor Visits  [evaluation of current treatment plan for chronic ischemic heart disease/ HTN and patients adherence to plan as established by provider. Assessed for chest pain and any new/ ongoing symptoms.]  Doctor Visits Discussed/Reviewed Doctor Visits Reviewed  [Offered to call patients cardiology office and schedule follow up appointment.  Confirmed patient scheduled follow up appointment with her primary care provider.  Advised to keep follow up appointments with providers as recommended.]  Education Interventions   Education Provided Provided Education  [reviewed signs/ symptoms of heart attack / stroke.  Advised to call 911 for severe CP/ symptoms and notify provider for any mild/ moderate symptoms.]  Nutrition Interventions   Nutrition Discussed/Reviewed Decreasing salt  Safety Interventions   Safety Discussed/Reviewed Safety Discussed  [Advised to use ambulatory devices as recommended by providers.]              SDOH assessments and interventions completed:  Yes  SDOH Interventions Today    Flowsheet Row Most Recent Value  SDOH Interventions   Food Insecurity Interventions Intervention Not Indicated  Housing Interventions Intervention Not Indicated  Transportation Interventions Intervention Not Indicated  Utilities Interventions Intervention Not Indicated        Care Coordination Interventions:  Yes, provided   Follow up plan: Follow up call scheduled for 03/29/23 at 2:00 pm    Encounter Outcome:  Patient Visit Completed   [name] Ina RN,BSN,CCM [facility]  [facility], [facility] coordinator / Case Manager Phone: [phone]

## 2023-03-01 NOTE — Patient Instructions (Signed)
Visit Information  Thank you for taking time to visit with me today. Please don't hesitate to contact me if I can be of assistance to you.   Following are the goals we discussed today:   Goals Addressed             This Visit's Progress    Patient will work with RN case manager to gain knowledge and management of health conditions.       Interventions Today    Flowsheet Row Most Recent Value  Chronic Disease   Chronic disease during today's visit Other, Hypertension (HTN)  [chronic ischemic heart disease.]  General Interventions   General Interventions Discussed/Reviewed General Interventions Reviewed, Doctor Visits  [evaluation of current treatment plan for chronic ischemic heart disease/ HTN and patients adherence to plan as established by provider. Assessed for chest pain and any new/ ongoing symptoms.]  Doctor Visits Discussed/Reviewed Doctor Visits Reviewed  [Offered to call patients cardiology office and schedule follow up appointment.  Confirmed patient scheduled follow up appointment with her primary care provider. Advised to keep follow up appointments with providers as recommended.]  Education Interventions   Education Provided Provided Education  [reviewed signs/ symptoms of heart attack / stroke.  Advised to call 911 for severe CP/ symptoms and notify provider for any mild/ moderate symptoms.]  Nutrition Interventions   Nutrition Discussed/Reviewed Decreasing salt  Safety Interventions   Safety Discussed/Reviewed Safety Discussed  [Advised to use ambulatory devices as recommended by providers.]              Our next appointment is by telephone on 03/29/23 at 2 pm  Please call the care guide team at [phone] if you need to cancel or reschedule your appointment.   If you are experiencing a Mental Health or Behavioral Health Crisis or need someone to talk to, please call the Suicide and Crisis Lifeline: 988 call 1-800-273-TALK (toll free, 24 hour  hotline)  Patient verbalizes understanding of instructions and care plan provided today and agrees to view in MyChart. Active MyChart status and patient understanding of how to access instructions and care plan via MyChart confirmed with patient.     [name] Ina RN,BSN,CCM [place]  [facility], [facility] coordinator / Case Manager Phone: [phone]

## 2023-03-03 ENCOUNTER — Telehealth: Payer: Self-pay | Admitting: *Deleted

## 2023-03-03 ENCOUNTER — Encounter: Payer: Self-pay | Admitting: Family Medicine

## 2023-03-03 ENCOUNTER — Ambulatory Visit (INDEPENDENT_AMBULATORY_CARE_PROVIDER_SITE_OTHER): Payer: 59 | Admitting: Family Medicine

## 2023-03-03 VITALS — BP 122/62 | HR 53 | Temp 98.2°F | Ht 66.0 in | Wt 165.8 lb

## 2023-03-03 DIAGNOSIS — Z5971 Insufficient health insurance coverage: Secondary | ICD-10-CM

## 2023-03-03 DIAGNOSIS — I350 Nonrheumatic aortic (valve) stenosis: Secondary | ICD-10-CM | POA: Diagnosis not present

## 2023-03-03 NOTE — Patient Instructions (Signed)
I put in the referral about seeing the heart surgery clinic.  I need input from them about your heart valve.   You should get a call from social work here at clinic.  Please talk to them about your insurance.   Keep taking isosorbide in the meantime.   Take care.  Glad to see you.

## 2023-03-03 NOTE — Progress Notes (Signed)
Complex Care Management Note Care Guide Note  03/03/2023 Name: [name] MRN: [id number] DOB: 07-16-1935   Complex Care Management Outreach Attempts: An unsuccessful telephone outreach was attempted today to offer the patient information about available complex care management services.  Follow Up Plan:  Additional outreach attempts will be made to offer the patient complex care management information and services.   Encounter Outcome:  No Answer  Burman Nieves, CCMA Care Coordination Care Guide Direct Dial: [phone]

## 2023-03-03 NOTE — Progress Notes (Unsigned)
ER eval d/w pt, with plan for addition of imdur. She took imdur in the meantime.  Not SOB- that is better.  Had burning in the chest yesterday.  Drinking water helps with the sx.  No NTG use.  Per patient, she thought cardiology was setting up cardiothoracic surgery eval but I don't see referral for that yet and she doesn't recall contact about scheduling.    She was able to do housework this AM w/o getting chest pain.    She fell recently, was sweeping at home.  Was barefoot.  She wasn't lightheaded but her feet slid out from under her.  She thought she scraped her R forearm on the broom on the way down.  No LOC.  She was able to get get up on her own.   She needs help with navigating her insurance coverage.  She has a signed form to send to [place] [employer] that appears to be about continued coverage- she is going to send that in.  SW referral placed.    Family stressors d/w pt.    Meds, vitals, and allergies reviewed.   ROS: Per HPI unless specifically indicated in ROS section   Nad [place]ck supple, no LA L>R trace BLE edema.   RRR with occ ectopy, not tachy.  SEM noted .  Ctab Abd soft, not ttp Skin well perfused.   2cm scrape on the R forearm, not bleeding.  Still with good tissue apposition.  Covered with nonstick bandage.  Routine care instructions discussed with patient.  I sent her home with extra bandage supplies.  35 minutes were devoted to patient care in this encounter (this includes time spent reviewing the patient's file/history, interviewing and examining the patient, counseling/reviewing plan with patient).

## 2023-03-05 DIAGNOSIS — I350 Nonrheumatic aortic (valve) stenosis: Secondary | ICD-10-CM | POA: Insufficient documentation

## 2023-03-05 DIAGNOSIS — Z5971 Insufficient health insurance coverage: Secondary | ICD-10-CM | POA: Insufficient documentation

## 2023-03-05 NOTE — Assessment & Plan Note (Signed)
She needs help with navigating her insurance coverage.  She has a signed form to send to [place] [employer] that appears to be about continued coverage- she is going to send that in.  SW referral placed.

## 2023-03-05 NOTE — Assessment & Plan Note (Signed)
Per patient, she thought cardiology was setting up cardiothoracic surgery eval but I don't see referral for that yet and she doesn't recall contact about scheduling.  Continue Imdur for now, referral placed.  Routine cautions given to patient.  She is able to do housework and not get chest pain.  Fall cautions discussed with patient.

## 2023-03-06 ENCOUNTER — Ambulatory Visit (INDEPENDENT_AMBULATORY_CARE_PROVIDER_SITE_OTHER): Payer: 59

## 2023-03-06 ENCOUNTER — Encounter (INDEPENDENT_AMBULATORY_CARE_PROVIDER_SITE_OTHER): Payer: Self-pay | Admitting: Nurse Practitioner

## 2023-03-06 ENCOUNTER — Ambulatory Visit (INDEPENDENT_AMBULATORY_CARE_PROVIDER_SITE_OTHER): Payer: 59 | Admitting: Nurse Practitioner

## 2023-03-06 VITALS — BP 122/53 | HR 59 | Resp 18 | Ht 66.0 in | Wt 163.6 lb

## 2023-03-06 DIAGNOSIS — I10 Essential (primary) hypertension: Secondary | ICD-10-CM

## 2023-03-06 DIAGNOSIS — Z9889 Other specified postprocedural states: Secondary | ICD-10-CM

## 2023-03-06 DIAGNOSIS — I739 Peripheral vascular disease, unspecified: Secondary | ICD-10-CM

## 2023-03-06 DIAGNOSIS — E785 Hyperlipidemia, unspecified: Secondary | ICD-10-CM

## 2023-03-06 NOTE — Progress Notes (Signed)
Complex Care Management Note Care Guide Note  03/06/2023 Name: ADAM SCHEPPS MRN: [id number] DOB: 1935/07/07   Complex Care Management Outreach Attempts: A second unsuccessful outreach was attempted today to offer the patient with information about available complex care management services.  Follow Up Plan:  Additional outreach attempts will be made to offer the patient complex care management information and services.   Encounter Outcome:  No Answer  Burman Nieves, CCMA Care Coordination Care Guide Direct Dial: [phone]

## 2023-03-07 LAB — VAS US ABI WITH/WO TBI
Left ABI: 1.01
Right ABI: 1.03

## 2023-03-07 NOTE — Progress Notes (Signed)
Complex Care Management Note Care Guide Note  03/07/2023 Name: [name] MRN: [id number] DOB: 15-Aug-1935   Complex Care Management Outreach Attempts: A third unsuccessful outreach was attempted today to offer the patient with information about available complex care management services.  Follow Up Plan:  No further outreach attempts will be made at this time. We have been unable to contact the patient to offer or enroll patient in complex care management services.  Encounter Outcome:  No Answer  Burman Nieves, CCMA Care Coordination Care Guide Direct Dial: [phone]
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
## 2023-03-11 ENCOUNTER — Emergency Department
Admission: EM | Admit: 2023-03-11 | Discharge: 2023-03-12 | Disposition: A | Discharge status: Home / Self Care | Payer: 59 | Attending: Emergency Medicine | Admitting: Emergency Medicine

## 2023-03-11 ENCOUNTER — Other Ambulatory Visit: Payer: Self-pay

## 2023-03-11 DIAGNOSIS — R935 Abnormal findings on diagnostic imaging of other abdominal regions, including retroperitoneum: Secondary | ICD-10-CM | POA: Diagnosis not present

## 2023-03-11 DIAGNOSIS — K449 Diaphragmatic hernia without obstruction or gangrene: Secondary | ICD-10-CM | POA: Diagnosis not present

## 2023-03-11 DIAGNOSIS — I959 Hypotension, unspecified: Secondary | ICD-10-CM | POA: Diagnosis not present

## 2023-03-11 DIAGNOSIS — R079 Chest pain, unspecified: Secondary | ICD-10-CM | POA: Insufficient documentation

## 2023-03-11 DIAGNOSIS — I2510 Atherosclerotic heart disease of native coronary artery without angina pectoris: Secondary | ICD-10-CM | POA: Insufficient documentation

## 2023-03-11 DIAGNOSIS — R1013 Epigastric pain: Secondary | ICD-10-CM | POA: Insufficient documentation

## 2023-03-11 DIAGNOSIS — R109 Unspecified abdominal pain: Secondary | ICD-10-CM | POA: Diagnosis present

## 2023-03-11 DIAGNOSIS — K8020 Calculus of gallbladder without cholecystitis without obstruction: Secondary | ICD-10-CM | POA: Diagnosis not present

## 2023-03-11 DIAGNOSIS — K5730 Diverticulosis of large intestine without perforation or abscess without bleeding: Secondary | ICD-10-CM | POA: Diagnosis not present

## 2023-03-11 DIAGNOSIS — I10 Essential (primary) hypertension: Secondary | ICD-10-CM | POA: Diagnosis not present

## 2023-03-11 DIAGNOSIS — R918 Other nonspecific abnormal finding of lung field: Secondary | ICD-10-CM | POA: Diagnosis not present

## 2023-03-11 DIAGNOSIS — Z79899 Other long term (current) drug therapy: Secondary | ICD-10-CM | POA: Diagnosis not present

## 2023-03-11 LAB — COMPREHENSIVE METABOLIC PANEL
ALT: 16 U/L (ref 0–44)
AST: 25 U/L (ref 15–41)
Albumin: 3.5 g/dL (ref 3.5–5.0)
Alkaline Phosphatase: 77 U/L (ref 38–126)
Anion gap: 12 (ref 5–15)
BUN: 21 mg/dL (ref 8–23)
CO2: 23 mmol/L (ref 22–32)
Calcium: 8.8 mg/dL — ABNORMAL LOW (ref 8.9–10.3)
Chloride: 101 mmol/L (ref 98–111)
Creatinine, Ser: 1.09 mg/dL — ABNORMAL HIGH (ref 0.44–1.00)
GFR, Estimated: 50 mL/min — ABNORMAL LOW (ref >60)
Glucose, Bld: 106 mg/dL — ABNORMAL HIGH (ref 70–99)
Potassium: 4 mmol/L (ref 3.5–5.1)
Sodium: 136 mmol/L (ref 135–145)
Total Bilirubin: 0.7 mg/dL (ref <1.2)
Total Protein: 6.8 g/dL (ref 6.5–8.1)

## 2023-03-11 LAB — CBC WITH DIFFERENTIAL/PLATELET
Abs Immature Granulocytes: 0.03 10*3/uL (ref 0.00–0.07)
Basophils Absolute: 0 10*3/uL (ref 0.0–0.1)
Basophils Relative: 0 %
Eosinophils Absolute: 0.1 10*3/uL (ref 0.0–0.5)
Eosinophils Relative: 2 %
HCT: 33.6 % — ABNORMAL LOW (ref 36.0–46.0)
Hemoglobin: 11 g/dL — ABNORMAL LOW (ref 12.0–15.0)
Immature Granulocytes: 0 %
Lymphocytes Relative: 28 %
Lymphs Abs: 2 10*3/uL (ref 0.7–4.0)
MCH: 28.6 pg (ref 26.0–34.0)
MCHC: 32.7 g/dL (ref 30.0–36.0)
MCV: 87.5 fL (ref 80.0–100.0)
Monocytes Absolute: 0.5 10*3/uL (ref 0.1–1.0)
Monocytes Relative: 8 %
Neutro Abs: 4.4 10*3/uL (ref 1.7–7.7)
Neutrophils Relative %: 62 %
Platelets: 178 10*3/uL (ref 150–400)
RBC: 3.84 MIL/uL — ABNORMAL LOW (ref 3.87–5.11)
RDW: 16.8 % — ABNORMAL HIGH (ref 11.5–15.5)
WBC: 7.1 10*3/uL (ref 4.0–10.5)
nRBC: 0 % (ref 0.0–0.2)

## 2023-03-11 LAB — TROPONIN I (HIGH SENSITIVITY): Troponin I (High Sensitivity): 15 ng/L (ref <18)

## 2023-03-11 LAB — LIPASE, BLOOD: Lipase: 28 U/L (ref 11–51)

## 2023-03-11 MED ORDER — SODIUM CHLORIDE 0.9 % IV BOLUS
1000.0000 mL | Freq: Once | INTRAVENOUS | Status: AC
  Administered 2023-03-11: 1000 mL via INTRAVENOUS

## 2023-03-11 NOTE — ED Triage Notes (Signed)
First Nurse Note:  Pt was reportedly trying to get into her phone and mistakenly called 911. So EMS and [facility] were sent to her house. Pt has no complaints, but paramedic on scene stated that bp was low so pt opted to come for evaluation. Pt alert and oriented and in no acute distress with EMS.   EMS VS: 96/44 HR 56 100% RA  Pt has hx of HTN and takes medication to lower it.

## 2023-03-11 NOTE — ED Provider Notes (Signed)
[facility] Provider Note    Event Date/Time   First MD Initiated Contact with Patient 03/11/23 2240     (approximate)   History   No chief complaint on file.   HPI  [name] is a 87 year old female with history of CAD, HTN presenting to the Emergency Department for evaluation of chest pain and abdominal pain.  Patient reportedly initially mistakenly called 911, but on EMS arrival patient was found to have low blood pressures so she was brought in for evaluation.  At the time of my evaluation, patient reports that she has been having intermittent chest pain and abdominal pain.  Do think the chest pain has been going on, unsure when her abdominal pain started.  Denies vomiting, diarrhea, dysuria, falls.     Physical Exam   Triage Vital Signs: ED Triage Vitals [03/11/23 2223]  Encounter Vitals Group     BP (!) 74/42     Systolic BP Percentile      Diastolic BP Percentile      Pulse Rate 62     Resp 16     Temp 98.5 F (36.9 C)     Temp src      SpO2 100 %     Weight 163 lb 9.3 oz (74.2 kg)     Height 5\' 6"  (1.676 m)     Head Circumference      Peak Flow      Pain Score 2     Pain Loc      Pain Education      Exclude from Growth Chart     Most recent vital signs: Vitals:   03/11/23 2223 03/11/23 2245  BP: (!) 74/42 (!) 84/40  Pulse: 62 (!) 59  Resp: 16 18  Temp: 98.5 F (36.9 C)   SpO2: 100% 99%     General: Awake, interactive  CV:  Regular rate, good peripheral perfusion.  Resp:  Unlabored respirations, lungs clear to auscultation Abd:  Nondistended, soft, mild generalized tenderness without rebound or guarding most notable along the epigastric area Neuro:  Symmetric facial movement, fluid speech, moving extremity spontaneously   ED Results / Procedures / Treatments   Labs (all labs ordered are listed, but only abnormal results are displayed) Labs Reviewed  CBC WITH DIFFERENTIAL/PLATELET - Abnormal; Notable for the  following components:      Result Value   RBC 3.84 (*)    Hemoglobin 11.0 (*)    HCT 33.6 (*)    RDW 16.8 (*)    All other components within normal limits  COMPREHENSIVE METABOLIC PANEL  LIPASE, BLOOD  URINALYSIS, ROUTINE W REFLEX MICROSCOPIC  TROPONIN I (HIGH SENSITIVITY)     EKG EKG independently reviewed interpreted by myself (ER attending) demonstrates:    RADIOLOGY Imaging independently reviewed and interpreted by myself demonstrates:  CT imaging pending  PROCEDURES:  Critical Care performed: No  Procedures   MEDICATIONS ORDERED IN ED: Medications  sodium chloride 0.9 % bolus 1,000 mL (1,000 mLs Intravenous New Bag/Given 03/11/23 2252)     IMPRESSION / MDM / ASSESSMENT AND PLAN / ED COURSE  I reviewed the triage vital signs and the nursing notes.  Differential diagnosis includes, but is not limited to, hypovolemia, infection, cardiogenic shock, medication adverse effect  Patient's presentation is most consistent with acute presentation with potential threat to life or bodily function.  87 year old female presenting with chest pain and abdominal pain hypotensive on presentation.  Awake and mentating appropriately on my  evaluation.  Broad initial differential.  Will trial fluid resuscitation, obtain labs, CT to further evaluate.  Signed out to oncoming provider at 2300 pending results of workup, additional studies as indicated, and disposition.     FINAL CLINICAL IMPRESSION(S) / ED DIAGNOSES   Final diagnoses:  Epigastric pain     Rx / DC Orders   ED Discharge Orders     None        Note:  This document was prepared using Dragon voice recognition software and may include unintentional dictation errors.   Trinna Post, MD 03/11/23 [phone]

## 2023-03-11 NOTE — ED Triage Notes (Signed)
Pt to ED via EMS from home, pt mistakenly called 911 and had no initial complaints, EMS found pts BP to be low and now pt is c.o abd pain. Denies n/v/d, or dysuria

## 2023-03-12 ENCOUNTER — Emergency Department: Payer: 59

## 2023-03-12 ENCOUNTER — Other Ambulatory Visit: Payer: 59

## 2023-03-12 DIAGNOSIS — I959 Hypotension, unspecified: Secondary | ICD-10-CM | POA: Diagnosis not present

## 2023-03-12 DIAGNOSIS — R918 Other nonspecific abnormal finding of lung field: Secondary | ICD-10-CM | POA: Diagnosis not present

## 2023-03-12 DIAGNOSIS — K449 Diaphragmatic hernia without obstruction or gangrene: Secondary | ICD-10-CM | POA: Diagnosis not present

## 2023-03-12 DIAGNOSIS — I2510 Atherosclerotic heart disease of native coronary artery without angina pectoris: Secondary | ICD-10-CM | POA: Diagnosis not present

## 2023-03-12 DIAGNOSIS — I10 Essential (primary) hypertension: Secondary | ICD-10-CM | POA: Diagnosis not present

## 2023-03-12 DIAGNOSIS — K8020 Calculus of gallbladder without cholecystitis without obstruction: Secondary | ICD-10-CM | POA: Diagnosis not present

## 2023-03-12 DIAGNOSIS — K5730 Diverticulosis of large intestine without perforation or abscess without bleeding: Secondary | ICD-10-CM | POA: Diagnosis not present

## 2023-03-12 DIAGNOSIS — R935 Abnormal findings on diagnostic imaging of other abdominal regions, including retroperitoneum: Secondary | ICD-10-CM | POA: Diagnosis not present

## 2023-03-12 LAB — URINALYSIS, ROUTINE W REFLEX MICROSCOPIC
Bacteria, UA: NONE SEEN
Bilirubin Urine: NEGATIVE
Glucose, UA: NEGATIVE mg/dL
Hgb urine dipstick: NEGATIVE
Ketones, ur: NEGATIVE mg/dL
Nitrite: NEGATIVE
Protein, ur: NEGATIVE mg/dL
Specific Gravity, Urine: 1.02 (ref 1.005–1.030)
pH: 5 (ref 5.0–8.0)

## 2023-03-12 LAB — TROPONIN I (HIGH SENSITIVITY): Troponin I (High Sensitivity): 13 ng/L (ref <18)

## 2023-03-12 MED ORDER — IOHEXOL 350 MG/ML SOLN
75.0000 mL | Freq: Once | INTRAVENOUS | Status: AC | PRN
  Administered 2023-03-12: 75 mL via INTRAVENOUS

## 2023-03-12 MED ORDER — LACTATED RINGERS IV BOLUS
1000.0000 mL | Freq: Once | INTRAVENOUS | Status: AC
  Administered 2023-03-12: 1000 mL via INTRAVENOUS

## 2023-03-12 NOTE — ED Notes (Signed)
Pt at CT

## 2023-03-12 NOTE — ED Notes (Signed)
Grandson called and message left that his grandmother is up for discharge. Will attempt to call again in a few minutes.

## 2023-03-12 NOTE — ED Notes (Signed)
Grandson called back and will come get pt.

## 2023-03-12 NOTE — ED Notes (Signed)
Grandson attempted to be reached again. No answer

## 2023-03-12 NOTE — ED Notes (Signed)
Grandson called again, no answer.

## 2023-03-12 NOTE — ED Provider Notes (Signed)
Patient received in signout from Dr. Rosalia Hammers pending remainder of blood work, urinalysis and cross-sectional imaging.   Briefly, patient reports that she accidentally called 911 and had no complaints but they checked her blood pressure and it was low so they brought her to the ED.  Here, she initially had no complaints but with further questioning she reports some epigastric discomfort.  She has a reassuring workup with normal CBC, metabolic panel, lipase, 2 negative troponins, clear urine.  CTA chest and venous abdomen without acute pathology.  No dysrhythmias on the monitor.  She is asking for some water but reports feeling fine.  She reports feeling somewhat weak for at least 3 to 4 months, working with her PCP and cardiologist, but without acute complaints.  Reports having cardiology clinic visit coming up this next week.  She is comfortable going home.  Her BP has improved with fluid resuscitation.  No emesis.  When I review her chart with multiple clinic visits and ED visits, her BP is typically around 100/50 and soft.  Tonight's values with seem fairly typical for her.  Considering her lack of symptoms, benign extensive workup and upcoming clinic visit while I did consider observation admission, I think a trial of outpatient management would be reasonable.  I discussed this with her and she is agreeable and would like to go home.  She will call a ride.  We discussed ED return precautions.  Marland Kitchen1-3 Lead EKG Interpretation  Performed by: Delton Prairie, MD Authorized by: Delton Prairie, MD     Interpretation: normal     ECG rate:  60   ECG rate assessment: normal     Rhythm: sinus rhythm     Ectopy: none     Conduction: normal   .Critical Care  Performed by: Delton Prairie, MD Authorized by: Delton Prairie, MD   Critical care provider statement:    Critical care time (minutes):  30   Critical care time was exclusive of:  Separately billable procedures and treating other patients   Critical care was  necessary to treat or prevent imminent or life-threatening deterioration of the following conditions:  Circulatory failure   Critical care was time spent personally by me on the following activities:  Development of treatment plan with patient or surrogate, discussions with consultants, evaluation of patient's response to treatment, examination of patient, ordering and review of laboratory studies, ordering and review of radiographic studies, ordering and performing treatments and interventions, pulse oximetry, re-evaluation of patient's condition and review of old charts     Delton Prairie, MD 03/12/23 [phone]

## 2023-03-13 ENCOUNTER — Telehealth: Payer: Self-pay | Admitting: Family Medicine

## 2023-03-13 ENCOUNTER — Ambulatory Visit: Payer: Self-pay | Admitting: Family Medicine

## 2023-03-13 NOTE — Telephone Encounter (Signed)
   Chief Complaint: shortness of breath Symptoms: chest pain, mild sob, burning feeling in her mouth Frequency: last seen in ED on Thursday Pertinent Negatives: Patient denies fever, congestion Disposition: [] ED /[] Urgent Care (no appt availability in office) / [x] Appointment(In office/virtual)/ []  [facility]/ [] Home Care/ [x] Refused Recommended Disposition /[] [facility]/ []  Follow-up with PCP Additional Notes: Patient reports she has been having ongoing chest pain and mild sob for the past month. This comes and goes. Patient was last seen on Thursday in the ED where she states her only diagnosis was low BP. This RN attempted to schedule appt in office today per protocol, no availability until tomorrow. This RN then suggested UC appt today. Patient refused and is requesting to be seen in office tomorrow. Appt scheduled 12/31. This RN advised patient that if symptoms continue/worsen before appt that patient should go to UC or the ED. Patient verbalized understanding.    Copied from CRM [id number]. Topic: Clinical - Red Word Triage >> Mar 13, 2023  1:24 PM Theodis Sato wrote: PT states she is having trouble breathing and that she thinks she going to have a heart attache Reason for Disposition  [1] MILD difficulty breathing (e.g., minimal/no SOB at rest, SOB with walking, pulse <100) AND [2] NEW-onset or WORSE than normal  Answer Assessment - Initial Assessment Questions 1. RESPIRATORY STATUS: "Describe your breathing?" (e.g., wheezing, shortness of breath, unable to speak, severe coughing)      Shortness of breath 2. ONSET: "When did this breathing problem begin?"      Thursday was seen in ED 3. PATTERN "Does the difficult breathing come and go, or has it been constant since it started?"      Comes and goes 4. SEVERITY: "How bad is your breathing?" (e.g., mild, moderate, severe)    - MILD: No SOB at rest, mild SOB with walking, speaks normally in sentences, can lie down, no  retractions, pulse < 100.    - MODERATE: SOB at rest, SOB with minimal exertion and prefers to sit, cannot lie down flat, speaks in phrases, mild retractions, audible wheezing, pulse 100-120.    - SEVERE: Very SOB at rest, speaks in single words, struggling to breathe, sitting hunched forward, retractions, pulse > 120      mild 5. RECURRENT SYMPTOM: "Have you had difficulty breathing before?" If Yes, ask: "When was the last time?" and "What happened that time?"      This has been going on and I see a cardiologist 6. CARDIAC HISTORY: "Do you have any history of heart disease?" (e.g., heart attack, angina, bypass surgery, angioplasty)      none 7. LUNG HISTORY: "Do you have any history of lung disease?"  (e.g., pulmonary embolus, asthma, emphysema)     none 8. CAUSE: "What do you think is causing the breathing problem?"      "I think I'm gonna have a heart attack" 9. OTHER SYMPTOMS: "Do you have any other symptoms? (e.g., dizziness, runny nose, cough, chest pain, fever)     Chest pain, burning feeling in mouth,  10. O2 SATURATION MONITOR:  "Do you use an oxygen saturation monitor (pulse oximeter) at home?" If Yes, ask: "What is your reading (oxygen level) today?" "What is your usual oxygen saturation reading?" (e.g., 95%)       unsure  Protocols used: Breathing Difficulty-A-AH

## 2023-03-13 NOTE — Telephone Encounter (Signed)
Agree with ER if escalating sx.

## 2023-03-13 NOTE — Telephone Encounter (Signed)
Looks like referral was just placed on 03/09/23, the referral dpt always sends related OV notes with referral

## 2023-03-13 NOTE — Telephone Encounter (Signed)
Copied from CRM [id number]. Topic: Medical Record Request - Other >> Mar 13, 2023  1:36 PM Theodis Sato wrote: Reason for CRM: PT is requesting Dr. Para March to send her medical records over the the surgeon he referred her to as soon as possible.

## 2023-03-14 ENCOUNTER — Ambulatory Visit (INDEPENDENT_AMBULATORY_CARE_PROVIDER_SITE_OTHER): Payer: 59 | Admitting: Nurse Practitioner

## 2023-03-14 VITALS — BP 108/64 | HR 71 | Temp 98.0°F | Ht 66.0 in | Wt 171.2 lb

## 2023-03-14 DIAGNOSIS — R079 Chest pain, unspecified: Secondary | ICD-10-CM | POA: Insufficient documentation

## 2023-03-14 DIAGNOSIS — R0609 Other forms of dyspnea: Secondary | ICD-10-CM

## 2023-03-14 MED ORDER — OMEPRAZOLE 20 MG PO CPDR: 20.0000 mg | DELAYED_RELEASE_CAPSULE | Freq: Every day | ORAL | 0 refills | Status: DC

## 2023-03-14 MED ORDER — PREDNISONE 20 MG PO TABS: ORAL_TABLET | ORAL | 0 refills | Status: AC

## 2023-03-14 NOTE — Patient Instructions (Signed)
Nice to see you today I will let Dr Para March know that I saw you today Follow up if you do not improve

## 2023-03-14 NOTE — Progress Notes (Signed)
 Acute Office Visit  Subjective:     Patient ID: [name], female    DOB: 09-17-1935, 87 y.o.   MRN: [id number]  Chief Complaint  Patient presents with   Chest Pain    Pt complains of sharp chest pain. States hard to breathe.      Patient is in today for Chest pain with a history of Afib, atherosclerosis, HTN, Asthma, GERD, anxiety  Of noted she was seen in the ED on 03/11/2023 for similar symptoms and was suppose to have a follow up with cardiology   States that the symptoms have been going on for a while. States since October. States that her PCP sent her to the cardiology. States that she called her cardiologist Monday but does not have an appointment   States the chest pain is coming and going that is described as a sharp pain. States that it happens with and without coughing. States that the chest pain getss worse with movement and she gets short of breath.  States that eating and drinking does not make the chest pain better or worse. Better with rest.   Patient is followed by Dr. Florencio at [facility] that she will have some dizziness with position changes. Patient did mention that she has been using an inhaler called fluticasone  that seemed to help with her shortness of breath.  Review of Systems  Constitutional:  Negative for chills and fever.  Respiratory:  Positive for cough and shortness of breath.   Cardiovascular:  Positive for chest pain.  Neurological:  Positive for dizziness. Negative for headaches.        Objective:    BP 108/64   Pulse 71   Temp 98 F (36.7 C) (Oral)   Ht 5' 6 (1.676 m)   Wt 171 lb 3.2 oz (77.7 kg)   SpO2 95%   BMI 27.63 kg/m    Physical Exam Vitals and nursing note reviewed.  Constitutional:      Appearance: Normal appearance.  HENT:     Right Ear: Tympanic membrane, ear canal and external ear normal.     Left Ear: Tympanic membrane, ear canal and external ear normal.     Mouth/Throat:     Mouth: Mucous  membranes are moist.     Pharynx: Oropharynx is clear.  Cardiovascular:     Rate and Rhythm: Normal rate and regular rhythm.     Heart sounds: Murmur heard.  Pulmonary:     Effort: Pulmonary effort is normal.     Breath sounds: Rhonchi present.  Musculoskeletal:     Right lower leg: No edema.     Left lower leg: No edema.  Lymphadenopathy:     Cervical: No cervical adenopathy.  Neurological:     Mental Status: She is alert.     No results found for any visits on 03/14/23.      Assessment & Plan:   Problem List Items Addressed This Visit       Other   DOE (dyspnea on exertion)   History of the same.  Patient did undergo a CT chest in the hospital that showed reactive disease versus bronchitis.  Given fluticasone  inhaler to help with shortness of breath will start patient on a prednisone  taper along with PPI for gastric protection.  Follow-up with primary care if no improvement      Relevant Medications   predniSONE  (DELTASONE ) 20 MG tablet   Chest pain - Primary   Reviewed recent hospitalization along with  blood work including troponins.  EKG in office within normal limits.  Patient is followed by cardiology recently put on long acting nitro.      Relevant Orders   EKG 12-Lead (Completed)    Meds ordered this encounter  Medications   predniSONE  (DELTASONE ) 20 MG tablet    Sig: Take 2 tablets (40 mg total) by mouth daily with breakfast for 3 days, THEN 1 tablet (20 mg total) daily with breakfast for 3 days. Avoid NSAIDs.    Dispense:  9 tablet    Refill:  0    Supervising Provider:   RANDEEN HARDY A [[id number]]   omeprazole  (PRILOSEC) 20 MG capsule    Sig: Take 1 capsule (20 mg total) by mouth daily.    Dispense:  10 capsule    Refill:  0    Supervising Provider:   RANDEEN HARDY A [[id number]]    Return if symptoms worsen or fail to improve.  Adina Crandall, NP

## 2023-03-14 NOTE — Assessment & Plan Note (Signed)
 History of the same.  Patient did undergo a CT chest in the hospital that showed reactive disease versus bronchitis.  Given fluticasone  inhaler to help with shortness of breath will start patient on a prednisone  taper along with PPI for gastric protection.  Follow-up with primary care if no improvement

## 2023-03-14 NOTE — Assessment & Plan Note (Signed)
Reviewed recent hospitalization along with blood work including troponins.  EKG in office within normal limits.  Patient is followed by cardiology recently put on long acting nitro.

## 2023-03-17 ENCOUNTER — Ambulatory Visit: Payer: Self-pay | Admitting: Family Medicine

## 2023-03-17 NOTE — Telephone Encounter (Signed)
 Chief Complaint: chest pain  Symptoms: chest pain that started around 5am this morning and lasted for about 1 hour- the worst pain I ever felt.  I thought I might be having a heart attack.  Also having pain in shoulders, back, rib cage area,  neck and arms, SOB, n/v Frequency: chest pain gone now but pain still in neck, back, arms, rib cage, shoulders, Pertinent Negatives: Patient denies SOB at present time Disposition: [] ED /[] Urgent Care (no appt availability in office) / [] Appointment(In office/virtual)/ []  [facility]/ [] Home Care/ [x] Refused Recommended Disposition /[] [facility]/ []  Follow-up with PCP Additional Notes: Nurse recommended pt goes to ED: pt refused stated she was going to wait for Dr. Cleatus to call her back - stated she depends on him and he takes care of her.  Pt stated he also was to send paperwork for her to heart doctor and they do not have it yet.  Education given to patient r/t s/s heart attack, the importance of seeking help and early intervention.  Education given to pt that nurse could not guarantee when Dr. Cleatus would be able to call back: pt verbalized understanding and still stated she would wait for PCP.  Reason for Disposition  [1] Chest pain (or angina) comes and goes AND [2] is happening more often (increasing in frequency) or getting worse (increasing in severity)  (Exception: Chest pains that last only a few seconds.)  Answer Assessment - Initial Assessment Questions 1. LOCATION: Where does it hurt?       Middle of chest and towards left side 2. RADIATION: Does the pain go anywhere else? (e.g., into neck, jaw, arms, back)     Pain in shoulder, behind neck on arms and down back and also in rib cage 3. ONSET: When did the chest pain begin? (Minutes, hours or days)      This morning started at 5am - chest pain lasts longer than ever has  4. PATTERN: Does the pain come and go, or has it been constant since it started?  Does it  get worse with exertion?      Come and goes, sometimes comes while walking 5. DURATION: How long does it last (e.g., seconds, minutes, hours)     Approx hour 6. SEVERITY: How bad is the pain?  (e.g., Scale 1-10; mild, moderate, or severe)    - MILD (1-3): doesn't interfere with normal activities     - MODERATE (4-7): interferes with normal activities or awakens from sleep    - SEVERE (8-10): excruciating pain, unable to do any normal activities       severe 7. CARDIAC RISK FACTORS: Do you have any history of heart problems or risk factors for heart disease? (e.g., angina, prior heart attack; diabetes, high blood pressure, high cholesterol, smoker, or strong family history of heart disease)     unknown 8. PULMONARY RISK FACTORS: Do you have any history of lung disease?  (e.g., blood clots in lung, asthma, emphysema, birth control pills)     Asthma a long time ago 9. CAUSE: What do you think is causing the chest pain?     unknown 10. OTHER SYMPTOMS: Do you have any other symptoms? (e.g., dizziness, nausea, vomiting, sweating, fever, difficulty breathing, cough)       SOB - then used inhaler, chest pain - worse I ever had , vomiting,  11. PREGNANCY: Is there any chance you are pregnant? When was your last menstrual period?  N/a  Protocols used: Chest Pain-A-AH

## 2023-03-17 NOTE — Telephone Encounter (Signed)
 I have called pt x 2 since speaking with Angie RN E2C2; no answer and no v/m set up. Send note to [facility] triage and Dr Para March and Para March pool. I will speak with AV CMA also.

## 2023-03-17 NOTE — Telephone Encounter (Signed)
 I spoke with pt; pt said  not having CP or SOB now but still hurting in neck, shoulders and back. Pt said earlier was worst pain she has ever had. I advised pt per Dr Cleatus he is very concerned about her and pt needs to go to ED now. Pt request paperwork CT chest and echo sent to that doctor in [place]; and then that doctor will see her. Pt does not know name of the office or doctor in [place]. Pt said Dr Cleatus referred her to them. ? Cardiothoracic surgeon.I advised pt would send note to referral dept about those reports but pt still needs to go to the ED. Pt said she was going to lay down now and think about it. Ed precautions given again and pt said she understood but would not commit to go to ED now but pt said she would think about it. I asked pt again to please go to ED and she said bye and ended call. Sending note to Dr Cleatus, Cleatus pool and referral pool. Also spoke with AV CMA.SABRA

## 2023-03-17 NOTE — Telephone Encounter (Signed)
 Called to [facility] and spoke with Triage Nurse Rena and made her aware of patient and refusal.

## 2023-03-17 NOTE — Telephone Encounter (Signed)
 Agree. Thanks

## 2023-03-20 ENCOUNTER — Other Ambulatory Visit (INDEPENDENT_AMBULATORY_CARE_PROVIDER_SITE_OTHER): Payer: 59 | Admitting: Pharmacist

## 2023-03-20 DIAGNOSIS — I259 Chronic ischemic heart disease, unspecified: Secondary | ICD-10-CM

## 2023-03-20 DIAGNOSIS — I2510 Atherosclerotic heart disease of native coronary artery without angina pectoris: Secondary | ICD-10-CM

## 2023-03-20 DIAGNOSIS — I10 Essential (primary) hypertension: Secondary | ICD-10-CM

## 2023-03-20 DIAGNOSIS — I4891 Unspecified atrial fibrillation: Secondary | ICD-10-CM

## 2023-03-20 DIAGNOSIS — F341 Dysthymic disorder: Secondary | ICD-10-CM

## 2023-03-20 DIAGNOSIS — I70222 Atherosclerosis of native arteries of extremities with rest pain, left leg: Secondary | ICD-10-CM

## 2023-03-20 DIAGNOSIS — E785 Hyperlipidemia, unspecified: Secondary | ICD-10-CM

## 2023-03-20 NOTE — Progress Notes (Addendum)
 Reason for call: Medication reconciliation and assistance with blister packaging of medication  Summary of Call: - 03/20/23: 3pm: No answer. Voicemail not set up. MyChart message sent. - 03/21/23: 11 am: No answer. Voicemail not set up.  - 03/31/23: Spoke to patient via phone. Successful outreach.    Reports she is filling her own pillbox currently but this is difficult and she sometimes drops her pills. She is concerned because she has great grandchildren crawling around.   Patient reports that she has used compliance packaging before. Upon discussion it appears that she had medication packets (as opposed to blister packaging). Called [facility] where some of her prescriptions have been sent though they report she has not filled meds there in >1 year.   Discussed with patient that [facility] offer pill packs similar to what she had before. She is interested in getting set up with [facility].     Manuelita FABIENE Kobs, PharmD Clinical Pharmacist [facility] [phone]

## 2023-03-27 DIAGNOSIS — H35031 Hypertensive retinopathy, right eye: Secondary | ICD-10-CM | POA: Diagnosis not present

## 2023-03-27 DIAGNOSIS — H43813 Vitreous degeneration, bilateral: Secondary | ICD-10-CM | POA: Diagnosis not present

## 2023-03-27 DIAGNOSIS — H353122 Nonexudative age-related macular degeneration, left eye, intermediate dry stage: Secondary | ICD-10-CM | POA: Diagnosis not present

## 2023-03-27 DIAGNOSIS — H353211 Exudative age-related macular degeneration, right eye, with active choroidal neovascularization: Secondary | ICD-10-CM | POA: Diagnosis not present

## 2023-03-28 ENCOUNTER — Telehealth: Payer: Self-pay | Admitting: Family Medicine

## 2023-03-28 NOTE — Telephone Encounter (Signed)
 CRM was wrongly sent to [facility]. This is a [employer] pt just [employer].

## 2023-03-29 ENCOUNTER — Ambulatory Visit: Payer: Self-pay

## 2023-03-29 NOTE — Patient Outreach (Signed)
 Care Coordination   Follow Up Visit Note   03/29/2023 Name: [name] MRN: [id number] DOB: Apr 13, 1935  [name] is a 88 y.o. year old female who sees Jacqueline Galea, MD for primary care. I spoke with  [name] by phone today.  What matters to the patients health and wellness today?  Patient reports she continues to have ongoing CP.  She states the chest pain is off and on feeling as if her chest is tight and feeling heart skipping beats.  Patient states she has worn a heart monitor in the past and had additional testing.  Patient states this has been going on for quite a while specifically since October 2024. She states provider is aware.   Patient reports calling primary provider office on 03/17/23 due to severe neck/ back/ shoulder pain.  She states she was advised by her provider to go to the ED. She states she elected not to go because she " can't keep making bills."  Patient reports being seen at ED on 03/10/24 due to low blood pressure. Per chart review referral was made by primary care provider to cardiothoracic surgeon on 03/02/24.  Patient states she has signed a medical release for her records and is waiting to hear back from cardiothoracic office to schedule an appointment. Patient reports sustaining a fall with in the past month or so without severe injury.    Goals Addressed             This Visit's Progress    Patient will work with RN case manager to gain knowledge and management of health conditions.       Interventions Today    Flowsheet Row Most Recent Value  Chronic Disease   Chronic disease during today's visit Hypertension (HTN), Other  [chronic ischemic heart disease / chest pain]  General Interventions   General Interventions Discussed/Reviewed General Interventions Reviewed, Doctor Visits  [evaluation of current treatment plan for listed health conditions and patients adherence to plan as established by provider. Assessed for ongoing CP/ back/ neck  shoulder pain symptoms and BP readings.]  Doctor Visits Discussed/Reviewed Doctor Visits Reviewed  [name] upcoming provider visits. Inquired if patient has been contacted by cardiothoracic surgeon office to schedule appointment.  Keep follow up appointments with providers as recommended. Notify RNCM if no return call from cardiology office.]  Education Interventions   Education Provided Provided Education  [Discussed need to be seen by ED versus urgent care for severe chest pain.  Advised to call 911 for severe CP/ SOB symptoms. Advised to call provider for any new or ongoing symptoms. Advised to have granddaughter on phone when calling insurance provider.]  Provided Verbal Education On Other  [Discussed with patient benefit insurance access. Confirmed patient has talked with benefit provider and inquired if additional assistance was needed. Advised to monitor BP and record reporting readings outside of established parameter to provider.]  Pharmacy Interventions   Pharmacy Dicussed/Reviewed Pharmacy Topics Reviewed  [medications reviewed and compliance discussed and advised.]  Safety Interventions   Safety Discussed/Reviewed Fall Risk, Safety Discussed  [Assessed for falls. Discussed fall prevention/ safety.  Sent patient education article on falls prevention by MyChart.]              SDOH assessments and interventions completed:  No     Care Coordination Interventions:  Yes, provided   Follow up plan: Follow up call scheduled for 04/21/23 at 11 am    Encounter Outcome:  Patient Visit Completed  Jacqueline Girt RN,BSN,CCM [place]  [facility] coordinator / Case Manager Phone: [phone]

## 2023-03-29 NOTE — Patient Instructions (Signed)
 Visit Information  Thank you for taking time to visit with me today. Please don't hesitate to contact me if I can be of assistance to you.   Following are the goals we discussed today:   Goals Addressed             This Visit's Progress    Patient will work with RN case manager to gain knowledge and management of health conditions.       Interventions Today    Flowsheet Row Most Recent Value  Chronic Disease   Chronic disease during today's visit Hypertension (HTN), Other  [chronic ischemic heart disease / chest pain]  General Interventions   General Interventions Discussed/Reviewed General Interventions Reviewed, Doctor Visits  [evaluation of current treatment plan for listed health conditions and patients adherence to plan as established by provider. Assessed for ongoing CP/ back/ neck shoulder pain symptoms and BP readings.]  Doctor Visits Discussed/Reviewed Doctor Visits Reviewed  Carin Charleston upcoming provider visits. Inquired if patient has been contacted by cardiothoracic surgeon office to schedule appointment.  Keep follow up appointments with providers as recommended. Notify RNCM if no return call from cardiology office.]  Education Interventions   Education Provided Provided Education  [Discussed need to be seen by ED versus urgent care for severe chest pain.  Advised to call 911 for severe CP/ SOB symptoms. Advised to call provider for any new or ongoing symptoms. Advised to have granddaughter on phone when calling insurance provider.]  Provided Verbal Education On Other  [Discussed with patient benefit insurance access. Confirmed patient has talked with benefit provider and inquired if additional assistance was needed. Advised to monitor BP and record reporting readings outside of established parameter to provider.]  Pharmacy Interventions   Pharmacy Dicussed/Reviewed Pharmacy Topics Reviewed  [medications reviewed and compliance discussed and advised.]  Safety Interventions    Safety Discussed/Reviewed Fall Risk, Safety Discussed  [Assessed for falls. Discussed fall prevention/ safety.  Sent patient education article on falls prevention by MyChart.]              Our next appointment is by telephone on 04/21/23 at 11 am  Please call the care guide team at [phone] if you need to cancel or reschedule your appointment.   If you are experiencing a Mental Health or Behavioral Health Crisis or need someone to talk to, please call the Suicide and Crisis Lifeline: 988 call 1-800-273-TALK (toll free, 24 hour hotline)  Patient verbalizes understanding of instructions and care plan provided today and agrees to view in MyChart. Active MyChart status and patient understanding of how to access instructions and care plan via MyChart confirmed with patient.     Markise Haymer RN,BSN,CCM [place]  [facility] coordinator / Case Manager Phone: [phone]  Fall Prevention in the Home, Adult Falls can cause injuries and can happen to people of all ages. There are many things you can do to make your home safer and to help prevent falls. What actions can I take to prevent falls? General information Use good lighting in all rooms. Make sure to: Replace any light bulbs that burn out. Turn on the lights in dark areas and use night-lights. Keep items that you use often in easy-to-reach places. Lower the shelves around your home if needed. Move furniture so that there are clear paths around it. Do not use throw rugs or other things on the floor that can make you trip. If any of your floors are uneven, fix them. Add color  or contrast paint or tape to clearly mark and help you see: Grab bars or handrails. First and last steps of staircases. Where the edge of each step is. If you use a ladder or stepladder: Make sure that it is fully opened. Do not climb a closed ladder. Make sure the sides of the ladder are locked in place. Have  someone hold the ladder while you use it. Know where your pets are as you move through your home. What can I do in the bathroom?     Keep the floor dry. Clean up any water  on the floor right away. Remove soap buildup in the bathtub or shower. Buildup makes bathtubs and showers slippery. Use non-skid mats or decals on the floor of the bathtub or shower. Attach bath mats securely with double-sided, non-slip rug tape. If you need to sit down in the shower, use a non-slip stool. Install grab bars by the toilet and in the bathtub and shower. Do not use towel bars as grab bars. What can I do in the bedroom? Make sure that you have a light by your bed that is easy to reach. Do not use any sheets or blankets on your bed that hang to the floor. Have a firm chair or bench with side arms that you can use for support when you get dressed. What can I do in the kitchen? Clean up any spills right away. If you need to reach something above you, use a step stool with a grab bar. Keep electrical cords out of the way. Do not use floor polish or wax that makes floors slippery. What can I do with my stairs? Do not leave anything on the stairs. Make sure that you have a light switch at the top and the bottom of the stairs. Make sure that there are handrails on both sides of the stairs. Fix handrails that are broken or loose. Install non-slip stair treads on all your stairs if they do not have carpet. Avoid having throw rugs at the top or bottom of the stairs. Choose a carpet that does not hide the edge of the steps on the stairs. Make sure that the carpet is firmly attached to the stairs. Fix carpet that is loose or worn. What can I do on the outside of my home? Use bright outdoor lighting. Fix the edges of walkways and driveways and fix any cracks. Clear paths of anything that can make you trip, such as tools or rocks. Add color or contrast paint or tape to clearly mark and help you see anything that might  make you trip as you walk through a door, such as a raised step or threshold. Trim any bushes or trees on paths to your home. Check to see if handrails are loose or broken and that both sides of all steps have handrails. Install guardrails along the edges of any raised decks and porches. Have leaves, snow, or ice cleared regularly. Use sand, salt, or ice melter on paths if you live where there is ice and snow during the winter. Clean up any spills in your garage right away. This includes grease or oil spills. What other actions can I take? Review your medicines with your doctor. Some medicines can cause dizziness or changes in blood pressure, which increase your risk of falling. Wear shoes that: Have a low heel. Do not wear high heels. Have rubber bottoms and are closed at the toe. Feel good on your feet and fit well. Use tools that  help you move around if needed. These include: Canes. Walkers. Scooters. Crutches. Ask your doctor what else you can do to help prevent falls. This may include seeing a physical therapist to learn to do exercises to move better and get stronger. Where to find more information Centers for Disease Control and Prevention, [employer]AD[employer]: TonerPromos.no [employer] on Aging: BaseRingTones.pl National Institute on Aging: BaseRingTones.pl Contact a doctor if: You are afraid of falling at home. You feel weak, drowsy, or dizzy at home. You fall at home. Get help right away if you: Lose consciousness or have trouble moving after a fall. Have a fall that causes a head injury. These symptoms may be an emergency. Get help right away. Call 911. Do not wait to see if the symptoms will go away. Do not drive yourself to the hospital. This information is not intended to replace advice given to you by your health care provider. Make sure you discuss any questions you have with your health care provider. Document Revised: 11/01/2021 Document Reviewed: 11/01/2021 Elsevier Patient Education   2024 [employer].

## 2023-03-31 NOTE — Addendum Note (Signed)
Addended by: Loree Fee on: 03/31/2023 02:58 PM   Modules accepted: Orders

## 2023-04-02 MED ORDER — APIXABAN 2.5 MG PO TABS
2.5000 mg | ORAL_TABLET | Freq: Two times a day (BID) | ORAL | 5 refills | Status: DC
  Filled 2023-04-02 – 2023-05-04 (×6): qty 60, 30d supply, fill #0
  Filled ????-??-??: fill #0

## 2023-04-02 MED ORDER — LISINOPRIL 5 MG PO TABS
5.0000 mg | ORAL_TABLET | Freq: Every day | ORAL | 5 refills | Status: DC
  Filled 2023-04-02 – 2023-04-11 (×4): qty 30, 30d supply, fill #0
  Filled 2023-04-26 – 2023-05-09 (×3): qty 30, 30d supply, fill #1
  Filled 2023-05-18 – 2023-06-01 (×3): qty 30, 30d supply, fill #2
  Filled ????-??-??: fill #2

## 2023-04-02 MED ORDER — SERTRALINE HCL 100 MG PO TABS
150.0000 mg | ORAL_TABLET | Freq: Every day | ORAL | 5 refills | Status: DC
  Filled 2023-04-02 – 2023-05-04 (×6): qty 45, 30d supply, fill #0
  Filled ????-??-??: fill #0

## 2023-04-02 MED ORDER — DILTIAZEM HCL ER COATED BEADS 180 MG PO CP24
180.0000 mg | ORAL_CAPSULE | Freq: Every day | ORAL | 5 refills | Status: DC
  Filled 2023-04-02 – 2023-05-09 (×7): qty 30, 30d supply, fill #0
  Filled 2023-05-18 – 2023-06-01 (×3): qty 30, 30d supply, fill #1
  Filled ????-??-??: fill #1

## 2023-04-02 MED ORDER — ATORVASTATIN CALCIUM 80 MG PO TABS
80.0000 mg | ORAL_TABLET | Freq: Every evening | ORAL | 11 refills | Status: DC
  Filled 2023-04-02 – 2023-04-11 (×4): qty 30, 30d supply, fill #0
  Filled 2023-04-26 – 2023-05-09 (×3): qty 30, 30d supply, fill #1
  Filled 2023-05-18 – 2023-06-01 (×3): qty 30, 30d supply, fill #2
  Filled ????-??-??: fill #2

## 2023-04-02 MED ORDER — GABAPENTIN 100 MG PO CAPS
100.0000 mg | ORAL_CAPSULE | Freq: Three times a day (TID) | ORAL | 5 refills | Status: DC
  Filled 2023-04-02 – 2023-05-09 (×5): qty 90, 30d supply, fill #0
  Filled 2023-05-18 – 2023-06-01 (×3): qty 90, 30d supply, fill #1
  Filled ????-??-??: fill #1
  Filled ????-??-??: fill #0

## 2023-04-02 MED ORDER — ISOSORBIDE MONONITRATE ER 30 MG PO TB24
30.0000 mg | ORAL_TABLET | Freq: Every day | ORAL | 5 refills | Status: DC
  Filled 2023-04-02 – 2023-05-09 (×7): qty 30, 30d supply, fill #0
  Filled 2023-05-18 – 2023-06-01 (×3): qty 30, 30d supply, fill #1
  Filled ????-??-??: fill #1

## 2023-04-03 ENCOUNTER — Other Ambulatory Visit: Payer: Self-pay

## 2023-04-03 ENCOUNTER — Encounter: Payer: Self-pay | Admitting: Pharmacist

## 2023-04-03 ENCOUNTER — Other Ambulatory Visit (HOSPITAL_COMMUNITY): Payer: Self-pay

## 2023-04-03 NOTE — Progress Notes (Signed)
Called patient to inform her that medication refills have all been sent to [facility] at [facility] for [employer] as discussed.   Called to inform her she will need to call the pharmacy to confirm address/payment information.  [facility]: Phone [phone]  Patient did not answer, voicemail not set up at this time.   Loree Fee, PharmD Clinical Pharmacist [facility] [phone]

## 2023-04-10 ENCOUNTER — Emergency Department
Admission: EM | Admit: 2023-04-10 | Discharge: 2023-04-11 | Disposition: A | Discharge status: Home / Self Care | Payer: 59 | Attending: Emergency Medicine | Admitting: Emergency Medicine

## 2023-04-10 ENCOUNTER — Other Ambulatory Visit: Payer: Self-pay

## 2023-04-10 ENCOUNTER — Emergency Department: Payer: 59

## 2023-04-10 DIAGNOSIS — N1330 Unspecified hydronephrosis: Secondary | ICD-10-CM | POA: Diagnosis not present

## 2023-04-10 DIAGNOSIS — I4891 Unspecified atrial fibrillation: Secondary | ICD-10-CM | POA: Diagnosis not present

## 2023-04-10 DIAGNOSIS — R079 Chest pain, unspecified: Secondary | ICD-10-CM | POA: Diagnosis not present

## 2023-04-10 DIAGNOSIS — R0789 Other chest pain: Secondary | ICD-10-CM | POA: Diagnosis not present

## 2023-04-10 DIAGNOSIS — R1011 Right upper quadrant pain: Secondary | ICD-10-CM | POA: Diagnosis not present

## 2023-04-10 DIAGNOSIS — I2510 Atherosclerotic heart disease of native coronary artery without angina pectoris: Secondary | ICD-10-CM | POA: Insufficient documentation

## 2023-04-10 DIAGNOSIS — K8020 Calculus of gallbladder without cholecystitis without obstruction: Secondary | ICD-10-CM | POA: Insufficient documentation

## 2023-04-10 DIAGNOSIS — Z86718 Personal history of other venous thrombosis and embolism: Secondary | ICD-10-CM | POA: Insufficient documentation

## 2023-04-10 DIAGNOSIS — R0602 Shortness of breath: Secondary | ICD-10-CM | POA: Insufficient documentation

## 2023-04-10 DIAGNOSIS — Z7901 Long term (current) use of anticoagulants: Secondary | ICD-10-CM | POA: Insufficient documentation

## 2023-04-10 DIAGNOSIS — K828 Other specified diseases of gallbladder: Secondary | ICD-10-CM | POA: Diagnosis not present

## 2023-04-10 DIAGNOSIS — I700 Atherosclerosis of aorta: Secondary | ICD-10-CM | POA: Insufficient documentation

## 2023-04-10 DIAGNOSIS — Z1152 Encounter for screening for COVID-19: Secondary | ICD-10-CM | POA: Insufficient documentation

## 2023-04-10 DIAGNOSIS — R109 Unspecified abdominal pain: Secondary | ICD-10-CM | POA: Diagnosis not present

## 2023-04-10 DIAGNOSIS — K5730 Diverticulosis of large intestine without perforation or abscess without bleeding: Secondary | ICD-10-CM | POA: Diagnosis not present

## 2023-04-10 DIAGNOSIS — R1010 Upper abdominal pain, unspecified: Secondary | ICD-10-CM | POA: Diagnosis present

## 2023-04-10 DIAGNOSIS — R531 Weakness: Secondary | ICD-10-CM | POA: Diagnosis not present

## 2023-04-10 DIAGNOSIS — R000 Tachycardia, unspecified: Secondary | ICD-10-CM | POA: Diagnosis not present

## 2023-04-10 DIAGNOSIS — R112 Nausea with vomiting, unspecified: Secondary | ICD-10-CM | POA: Diagnosis not present

## 2023-04-10 DIAGNOSIS — I10 Essential (primary) hypertension: Secondary | ICD-10-CM | POA: Insufficient documentation

## 2023-04-10 DIAGNOSIS — R110 Nausea: Secondary | ICD-10-CM

## 2023-04-10 MED ORDER — LIDOCAINE VISCOUS HCL 2 % MT SOLN
15.0000 mL | Freq: Once | OROMUCOSAL | Status: AC
  Administered 2023-04-11: 15 mL via ORAL
  Filled 2023-04-10: qty 15

## 2023-04-10 MED ORDER — ONDANSETRON HCL 4 MG/2ML IJ SOLN
4.0000 mg | Freq: Once | INTRAMUSCULAR | Status: AC
  Administered 2023-04-11: 4 mg via INTRAVENOUS
  Filled 2023-04-10: qty 2

## 2023-04-10 MED ORDER — ALUM & MAG HYDROXIDE-SIMETH 200-200-20 MG/5ML PO SUSP
30.0000 mL | Freq: Once | ORAL | Status: AC
  Administered 2023-04-11: 30 mL via ORAL
  Filled 2023-04-10: qty 30

## 2023-04-10 MED ORDER — ASPIRIN 81 MG PO CHEW
324.0000 mg | CHEWABLE_TABLET | Freq: Once | ORAL | Status: AC
  Administered 2023-04-11: 324 mg via ORAL
  Filled 2023-04-10: qty 4

## 2023-04-10 MED ORDER — PANTOPRAZOLE SODIUM 40 MG IV SOLR
40.0000 mg | Freq: Once | INTRAVENOUS | Status: AC
  Administered 2023-04-11: 40 mg via INTRAVENOUS
  Filled 2023-04-10: qty 10

## 2023-04-10 NOTE — ED Provider Notes (Signed)
[facility] Provider Note    Event Date/Time   First MD Initiated Contact with Patient 04/10/23 2342     (approximate)   History   Weakness, Shortness of Breath, and Chest Pain   HPI  [name] is a 88 y.o. female   Past medical history of CAD, hypertension, DVT on Eliquis, who presents to the emergency department with ongoing chest and abdominal pain for the last several months.  She states that this pain has been bothering her "since September" and she was seen for worsening recently in the emergency department on 03/11/2023 and got extensive workup including CT angiogram of the chest, CT abdomen/pelvis, labs including troponins which were unrevealing.  She had borderline low blood pressure that improved with fluids and on medical chart review by that provider, blood pressure chronically low.  Since then discomfort has been ongoing and has been having telephone encounters with her primary doctor referring her to cardiothoracic specialists, but patient has not yet done so.  Today she returns emergency department because of progressive worsening of her symptoms.  She does state of pain that wraps around her upper abdomen and into her chest, into her shoulders.  She denies shortness of breath.  She denies cough, congestion, sore throat.  She does have a sensation of "hotness" that comes up through her belly and chest.  She denies urinary symptoms.  Independent Historian contributed to assessment above: EMS gives collateral information, patient's chief complaint  External Medical Documents Reviewed: Emergency department visit from December 2024 and telephone encounters with her primary doctor throughout January as above      Physical Exam   Triage Vital Signs: ED Triage Vitals [04/10/23 2345]  Encounter Vitals Group     BP      Systolic BP Percentile      Diastolic BP Percentile      Pulse      Resp      Temp      Temp src      SpO2 97 %      Weight      Height      Head Circumference      Peak Flow      Pain Score      Pain Loc      Pain Education      Exclude from Growth Chart     Most recent vital signs: Vitals:   04/11/23 0130 04/11/23 0200  BP: 133/66 137/63  Pulse: (!) 58 (!) 56  Resp:  19  Temp:    SpO2: 96% 97%    General: Awake, no distress.  CV:  Good peripheral perfusion.  Resp:  Normal effort.  Abd:  No distention.  Other:  Chronically ill-appearing 88 year old, comfortably laying in the stretcher appropriate and conversant and pleasant.  She appears overall euvolemic with a soft benign abdominal exam to deep palpation in all quadrants without rigidity or guarding.  Her lung sounds are clear without obvious focality or wheezing.  Heart sounds regular without murmur.   ED Results / Procedures / Treatments   Labs (all labs ordered are listed, but only abnormal results are displayed) Labs Reviewed  COMPREHENSIVE METABOLIC PANEL - Abnormal; Notable for the following components:      Result Value   Glucose, Bld 108 (*)    Albumin 3.4 (*)    All other components within normal limits  RESP PANEL BY RT-PCR (RSV, FLU A&B, COVID)  RVPGX2  LIPASE, BLOOD  CBC  WITH DIFFERENTIAL/PLATELET  TROPONIN I (HIGH SENSITIVITY)  TROPONIN I (HIGH SENSITIVITY)     I ordered and reviewed the above labs they are notable for cell counts electrolytes and serial troponins unremarkable.  EKG  ED ECG REPORT I, Pilar Jarvis, the attending physician, personally viewed and interpreted this ECG.   Date: 04/10/2023  EKG Time: 0000  Rate: 74  Rhythm: AF  Axis: nl  Intervals:none  ST&T Change: no stemi    RADIOLOGY I independently reviewed and interpreted cxr and see no obvious focality or pneumothorax I also reviewed radiologist's formal read.   PROCEDURES:  Critical Care performed: No  Procedures   MEDICATIONS ORDERED IN ED: Medications  pantoprazole (PROTONIX) injection 40 mg (40 mg Intravenous Given  04/11/23 0009)  alum & mag hydroxide-simeth (MAALOX/MYLANTA) 200-200-20 MG/5ML suspension 30 mL (30 mLs Oral Given 04/11/23 0008)    And  lidocaine (XYLOCAINE) 2 % viscous mouth solution 15 mL (15 mLs Oral Given 04/11/23 0008)  aspirin chewable tablet 324 mg (324 mg Oral Given 04/11/23 0009)  ondansetron (ZOFRAN) injection 4 mg (4 mg Intravenous Given 04/11/23 0009)     IMPRESSION / MDM / ASSESSMENT AND PLAN / ED COURSE  I reviewed the triage vital signs and the nursing notes.                                Patient's presentation is most consistent with acute presentation with potential threat to life or bodily function.  Differential diagnosis includes, but is not limited to, ACS, PE, dissection, cholelithiasis/biliary colic/cholecystitis, pancreatitis, GERD/gastritis/ulcer, viral illness, bacterial pneumonia    The patient is on the cardiac monitor to evaluate for evidence of arrhythmia and/or significant heart rate changes.  MDM:    This is a patient with vague symptoms ongoing for many months including chest pain and abdominal pain with a very thorough workup last month for the same symptoms that was largely unrevealing.  She presents with ongoing symptoms today.  Considering cardiopulmonary emergencies like ACS especially in light of her cardiac risk factors and history of CAD, will obtain EKG and serial troponins.  Also considered PE however without any shortness of breath, she is on Eliquis, and had a CT angiogram just last month with identical symptoms, I think this is less likely.  Symptoms do not match with dissection given chronic nature, especially in the light of normal-appearing CT angiogram negative for dissection 1 month ago for identical symptoms.    Could represent biliary colic or cholecystitis given that her imaging from 1 month ago did show gallstones.  Her symptoms do match with upper abdominal pain/chest pain radiating to the shoulders.  Will check a right upper  quadrant ultrasound as well as biliary labs today for any surgical needs, otherwise can refer to general surgery as these gallstones may represent her symptoms.  Perhaps an element of GERD with this burning sensation going to the chest as well.  Will medicate with PPI, GI cocktail, can give referral to GI if all workup today is negative for emergency pathologies.  Will give a referral to cardiology as well given her chest pain and history of CAD, she has been working with her primary doctor to get connected with the cardiac specialist as well.  I will provide referral today.   Workup unremarkable.  Discharged with referrals as above.  Patient stable.       FINAL CLINICAL IMPRESSION(S) / ED DIAGNOSES   Final  diagnoses:  Nonspecific chest pain  Gallstones  Nausea     Rx / DC Orders   ED Discharge Orders          Ordered    Ambulatory referral to Cardiology       Comments: If you have not heard from the Cardiology office within the next 72 hours please call [phone].   04/11/23 0448    Ambulatory referral to Gastroenterology        04/11/23 0448    omeprazole (PRILOSEC) 20 MG capsule  Daily        04/11/23 0449             Note:  This document was prepared using Dragon voice recognition software and may include unintentional dictation errors.    Pilar Jarvis, MD 04/11/23 [phone]

## 2023-04-10 NOTE — ED Triage Notes (Signed)
Pt brought in by EMS from home. Complaints of feeling weak, SOB, and chest pain that worsened today. Pt A/Ox4, NAD, MAE. Answering questions appropriately and stating she "has been feeling unwell since Sept"

## 2023-04-11 ENCOUNTER — Other Ambulatory Visit: Payer: Self-pay

## 2023-04-11 ENCOUNTER — Emergency Department: Payer: 59

## 2023-04-11 ENCOUNTER — Other Ambulatory Visit (HOSPITAL_COMMUNITY): Payer: Self-pay

## 2023-04-11 DIAGNOSIS — R1011 Right upper quadrant pain: Secondary | ICD-10-CM | POA: Diagnosis not present

## 2023-04-11 DIAGNOSIS — R531 Weakness: Secondary | ICD-10-CM | POA: Diagnosis not present

## 2023-04-11 DIAGNOSIS — I700 Atherosclerosis of aorta: Secondary | ICD-10-CM | POA: Diagnosis not present

## 2023-04-11 DIAGNOSIS — N1330 Unspecified hydronephrosis: Secondary | ICD-10-CM | POA: Diagnosis not present

## 2023-04-11 DIAGNOSIS — K5730 Diverticulosis of large intestine without perforation or abscess without bleeding: Secondary | ICD-10-CM | POA: Diagnosis not present

## 2023-04-11 DIAGNOSIS — K8020 Calculus of gallbladder without cholecystitis without obstruction: Secondary | ICD-10-CM | POA: Diagnosis not present

## 2023-04-11 DIAGNOSIS — K828 Other specified diseases of gallbladder: Secondary | ICD-10-CM | POA: Diagnosis not present

## 2023-04-11 DIAGNOSIS — R079 Chest pain, unspecified: Secondary | ICD-10-CM | POA: Diagnosis not present

## 2023-04-11 DIAGNOSIS — R0602 Shortness of breath: Secondary | ICD-10-CM | POA: Diagnosis not present

## 2023-04-11 LAB — CBC WITH DIFFERENTIAL/PLATELET
Abs Immature Granulocytes: 0.03 10*3/uL (ref 0.00–0.07)
Basophils Absolute: 0 10*3/uL (ref 0.0–0.1)
Basophils Relative: 0 %
Eosinophils Absolute: 0.1 10*3/uL (ref 0.0–0.5)
Eosinophils Relative: 1 %
HCT: 37.3 % (ref 36.0–46.0)
Hemoglobin: 12.1 g/dL (ref 12.0–15.0)
Immature Granulocytes: 1 %
Lymphocytes Relative: 31 %
Lymphs Abs: 1.8 10*3/uL (ref 0.7–4.0)
MCH: 28.4 pg (ref 26.0–34.0)
MCHC: 32.4 g/dL (ref 30.0–36.0)
MCV: 87.6 fL (ref 80.0–100.0)
Monocytes Absolute: 0.6 10*3/uL (ref 0.1–1.0)
Monocytes Relative: 10 %
Neutro Abs: 3.2 10*3/uL (ref 1.7–7.7)
Neutrophils Relative %: 57 %
Platelets: 151 10*3/uL (ref 150–400)
RBC: 4.26 MIL/uL (ref 3.87–5.11)
RDW: 14.5 % (ref 11.5–15.5)
WBC: 5.6 10*3/uL (ref 4.0–10.5)
nRBC: 0 % (ref 0.0–0.2)

## 2023-04-11 LAB — COMPREHENSIVE METABOLIC PANEL
ALT: 16 U/L (ref 0–44)
AST: 26 U/L (ref 15–41)
Albumin: 3.4 g/dL — ABNORMAL LOW (ref 3.5–5.0)
Alkaline Phosphatase: 77 U/L (ref 38–126)
Anion gap: 8 (ref 5–15)
BUN: 19 mg/dL (ref 8–23)
CO2: 27 mmol/L (ref 22–32)
Calcium: 8.9 mg/dL (ref 8.9–10.3)
Chloride: 104 mmol/L (ref 98–111)
Creatinine, Ser: 0.74 mg/dL (ref 0.44–1.00)
GFR, Estimated: >60 mL/min (ref >60)
Glucose, Bld: 108 mg/dL — ABNORMAL HIGH (ref 70–99)
Potassium: 4 mmol/L (ref 3.5–5.1)
Sodium: 139 mmol/L (ref 135–145)
Total Bilirubin: 0.6 mg/dL (ref 0.0–1.2)
Total Protein: 6.8 g/dL (ref 6.5–8.1)

## 2023-04-11 LAB — RESP PANEL BY RT-PCR (RSV, FLU A&B, COVID)  RVPGX2
Influenza A by PCR: NEGATIVE
Influenza B by PCR: NEGATIVE
Resp Syncytial Virus by PCR: NEGATIVE
SARS Coronavirus 2 by RT PCR: NEGATIVE

## 2023-04-11 LAB — LIPASE, BLOOD: Lipase: 33 U/L (ref 11–51)

## 2023-04-11 LAB — TROPONIN I (HIGH SENSITIVITY)
Troponin I (High Sensitivity): 12 ng/L (ref <18)
Troponin I (High Sensitivity): 15 ng/L (ref <18)

## 2023-04-11 MED ORDER — OMEPRAZOLE 20 MG PO CPDR: 20.0000 mg | DELAYED_RELEASE_CAPSULE | Freq: Every day | ORAL | 0 refills | Status: DC

## 2023-04-11 NOTE — Discharge Instructions (Signed)
Fortunately your testing in the Emergency Department did not show any emergency conditions account for your symptoms.  You do have gallstones which may cause the irritation in your upper abdomen and chest.  Call Dr. Maia Plan who is a general surgeon who can discuss options including surgery.  I also made a referral to a cardiologist who can do further testing for your chest pain.  I also made a referral to a gastroenterologist who can help further diagnose upper abdominal pain that may be caused by other reasons other than gallstones.  Take omeprazole as prescribed.  Thank you for choosing [place] for your health care today!  Please see your primary doctor this week for a follow up appointment.   If you have any new, worsening, or unexpected symptoms call your doctor right away or come back to the emergency department for reevaluation.  It was my pleasure to care for you today.   Daneil Dan Modesto Charon, MD

## 2023-04-12 ENCOUNTER — Encounter: Payer: Self-pay | Admitting: Cardiology

## 2023-04-12 ENCOUNTER — Ambulatory Visit: Payer: 59 | Attending: Cardiology | Admitting: Cardiology

## 2023-04-12 VITALS — BP 112/58 | HR 62 | Ht 66.0 in | Wt 174.8 lb

## 2023-04-12 DIAGNOSIS — I2510 Atherosclerotic heart disease of native coronary artery without angina pectoris: Secondary | ICD-10-CM | POA: Diagnosis not present

## 2023-04-12 DIAGNOSIS — I10 Essential (primary) hypertension: Secondary | ICD-10-CM | POA: Diagnosis not present

## 2023-04-12 DIAGNOSIS — I480 Paroxysmal atrial fibrillation: Secondary | ICD-10-CM

## 2023-04-12 DIAGNOSIS — I350 Nonrheumatic aortic (valve) stenosis: Secondary | ICD-10-CM
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
## 2023-04-12 NOTE — Patient Instructions (Signed)
Medication Instructions:  Your Physician recommend you continue on your current medication as directed.    *If you need a refill on your cardiac medications before your next appointment, please call your pharmacy*   Lab Work: None ordered.  If you have labs (blood work) drawn today and your tests are completely normal, you will receive your results only by: MyChart Message (if you have MyChart) OR A paper copy in the mail If you have any lab test that is abnormal or we need to change your treatment, we will call you to review the results.   Testing/Procedures: None ordered.    Follow-Up: At [facility], you and your health needs are our priority.  As part of our continuing mission to provide you with exceptional heart care, we have created designated Provider Care Teams.  These Care Teams include your primary Cardiologist (physician) and Advanced Practice Providers (APPs -  Physician Assistants and Nurse Practitioners) who all work together to provide you with the care you need, when you need it.  We recommend signing up for the patient portal called "MyChart".  Sign up information is provided on this After Visit Summary.  MyChart is used to connect with patients for Virtual Visits (Telemedicine).  Patients are able to view lab/test results, encounter notes, upcoming appointments, etc.  Non-urgent messages can be sent to your provider as well.   To learn more about what you can do with MyChart, go to [email or web address].    Your next appointment:   Follow up as needed.   Provider:   You may see Dr. Azucena Cecil or one of the following Advanced Practice Providers on your designated Care Team:   Nicolasa Ducking, NP Eula Listen, PA-C Cadence Fransico Michael, PA-C Charlsie Quest, NP Carlos Levering, NP

## 2023-04-14 DIAGNOSIS — I259 Chronic ischemic heart disease, unspecified: Secondary | ICD-10-CM | POA: Diagnosis not present

## 2023-04-14 DIAGNOSIS — I25118 Atherosclerotic heart disease of native coronary artery with other forms of angina pectoris: Secondary | ICD-10-CM | POA: Diagnosis not present

## 2023-04-14 DIAGNOSIS — I480 Paroxysmal atrial fibrillation: Secondary | ICD-10-CM | POA: Diagnosis not present

## 2023-04-14 DIAGNOSIS — I2510 Atherosclerotic heart disease of native coronary artery without angina pectoris: Secondary | ICD-10-CM | POA: Diagnosis not present

## 2023-04-14 DIAGNOSIS — I10 Essential (primary) hypertension: Secondary | ICD-10-CM | POA: Diagnosis not present

## 2023-04-14 DIAGNOSIS — I350 Nonrheumatic aortic (valve) stenosis: Secondary | ICD-10-CM | POA: Diagnosis not present

## 2023-04-14 DIAGNOSIS — E782 Mixed hyperlipidemia: Secondary | ICD-10-CM | POA: Diagnosis not present

## 2023-04-16 ENCOUNTER — Other Ambulatory Visit: Payer: Self-pay | Admitting: Nurse Practitioner

## 2023-04-17 DIAGNOSIS — Z006 Encounter for examination for normal comparison and control in clinical research program: Secondary | ICD-10-CM | POA: Diagnosis not present

## 2023-04-17 DIAGNOSIS — R931 Abnormal findings on diagnostic imaging of heart and coronary circulation: Secondary | ICD-10-CM | POA: Diagnosis not present

## 2023-04-17 DIAGNOSIS — Z952 Presence of prosthetic heart valve: Secondary | ICD-10-CM | POA: Diagnosis not present

## 2023-04-17 DIAGNOSIS — R918 Other nonspecific abnormal finding of lung field: Secondary | ICD-10-CM | POA: Diagnosis not present

## 2023-04-17 DIAGNOSIS — D6489 Other specified anemias: Secondary | ICD-10-CM | POA: Diagnosis not present

## 2023-04-17 DIAGNOSIS — J45909 Unspecified asthma, uncomplicated: Secondary | ICD-10-CM | POA: Diagnosis not present

## 2023-04-17 DIAGNOSIS — I350 Nonrheumatic aortic (valve) stenosis: Secondary | ICD-10-CM | POA: Diagnosis not present

## 2023-04-17 DIAGNOSIS — Z888 Allergy status to other drugs, medicaments and biological substances status: Secondary | ICD-10-CM | POA: Diagnosis not present

## 2023-04-17 DIAGNOSIS — I25118 Atherosclerotic heart disease of native coronary artery with other forms of angina pectoris: Secondary | ICD-10-CM | POA: Diagnosis not present

## 2023-04-17 DIAGNOSIS — K219 Gastro-esophageal reflux disease without esophagitis: Secondary | ICD-10-CM | POA: Diagnosis not present

## 2023-04-17 DIAGNOSIS — I25110 Atherosclerotic heart disease of native coronary artery with unstable angina pectoris: Secondary | ICD-10-CM | POA: Diagnosis not present

## 2023-04-17 DIAGNOSIS — Z01810 Encounter for preprocedural cardiovascular examination: Secondary | ICD-10-CM | POA: Diagnosis not present

## 2023-04-17 DIAGNOSIS — I517 Cardiomegaly: Secondary | ICD-10-CM | POA: Diagnosis not present

## 2023-04-17 DIAGNOSIS — I10 Essential (primary) hypertension: Secondary | ICD-10-CM | POA: Diagnosis not present

## 2023-04-17 DIAGNOSIS — R197 Diarrhea, unspecified: Secondary | ICD-10-CM | POA: Diagnosis not present

## 2023-04-17 DIAGNOSIS — Z7901 Long term (current) use of anticoagulants: Secondary | ICD-10-CM | POA: Diagnosis not present

## 2023-04-17 DIAGNOSIS — R079 Chest pain, unspecified: Secondary | ICD-10-CM | POA: Diagnosis not present

## 2023-04-17 DIAGNOSIS — E785 Hyperlipidemia, unspecified: Secondary | ICD-10-CM | POA: Diagnosis not present

## 2023-04-17 DIAGNOSIS — R7989 Other specified abnormal findings of blood chemistry: Secondary | ICD-10-CM | POA: Diagnosis not present

## 2023-04-17 DIAGNOSIS — Z79899 Other long term (current) drug therapy: Secondary | ICD-10-CM | POA: Diagnosis not present

## 2023-04-17 DIAGNOSIS — E46 Unspecified protein-calorie malnutrition: Secondary | ICD-10-CM | POA: Diagnosis not present

## 2023-04-17 DIAGNOSIS — Z880 Allergy status to penicillin: Secondary | ICD-10-CM | POA: Diagnosis not present

## 2023-04-17 DIAGNOSIS — Z4682 Encounter for fitting and adjustment of non-vascular catheter: Secondary | ICD-10-CM | POA: Diagnosis not present

## 2023-04-17 DIAGNOSIS — J9811 Atelectasis: Secondary | ICD-10-CM | POA: Diagnosis not present

## 2023-04-17 DIAGNOSIS — I259 Chronic ischemic heart disease, unspecified: Secondary | ICD-10-CM | POA: Diagnosis not present

## 2023-04-17 DIAGNOSIS — I4819 Other persistent atrial fibrillation: Secondary | ICD-10-CM | POA: Diagnosis not present

## 2023-04-17 DIAGNOSIS — R001 Bradycardia, unspecified: Secondary | ICD-10-CM | POA: Diagnosis not present

## 2023-04-17 DIAGNOSIS — Z7902 Long term (current) use of antithrombotics/antiplatelets: Secondary | ICD-10-CM | POA: Diagnosis not present

## 2023-04-17 DIAGNOSIS — Z452 Encounter for adjustment and management of vascular access device: Secondary | ICD-10-CM | POA: Diagnosis not present

## 2023-04-17 DIAGNOSIS — I480 Paroxysmal atrial fibrillation: Secondary | ICD-10-CM | POA: Diagnosis not present

## 2023-04-18 DIAGNOSIS — I350 Nonrheumatic aortic (valve) stenosis: Secondary | ICD-10-CM | POA: Diagnosis not present

## 2023-04-18 DIAGNOSIS — I259 Chronic ischemic heart disease, unspecified: Secondary | ICD-10-CM | POA: Diagnosis not present

## 2023-04-18 MED ORDER — OMEPRAZOLE 20 MG PO CPDR: 20.0000 mg | DELAYED_RELEASE_CAPSULE | Freq: Every day | ORAL | 0 refills | Status: DC

## 2023-04-18 NOTE — Addendum Note (Signed)
Addended by: Leonor Liv on: 04/18/2023 08:51 AM   Modules accepted: Orders

## 2023-04-19 DIAGNOSIS — I259 Chronic ischemic heart disease, unspecified: Secondary | ICD-10-CM | POA: Diagnosis not present

## 2023-04-19 DIAGNOSIS — I350 Nonrheumatic aortic (valve) stenosis: Secondary | ICD-10-CM | POA: Diagnosis not present

## 2023-04-20 ENCOUNTER — Other Ambulatory Visit (HOSPITAL_COMMUNITY): Payer: Self-pay

## 2023-04-20 ENCOUNTER — Ambulatory Visit: Payer: Self-pay

## 2023-04-20 DIAGNOSIS — I259 Chronic ischemic heart disease, unspecified: Secondary | ICD-10-CM | POA: Diagnosis not present

## 2023-04-20 NOTE — Patient Outreach (Signed)
  Care Coordination   04/20/2023 Name: [name] MRN: [id number] DOB: October 03, 1935   Care Coordination Outreach Attempts:  An unsuccessful outreach was attempted for an appointment today. Unable to reach patient or leave voice message due to voicemail box not being set up yet.   Follow Up Plan:  Additional outreach attempts will be made to offer the patient complex care management information and services.   Encounter Outcome:  No Answer   Care Coordination Interventions:  No, not indicated    Arvin Seip RN, BSN, CCM [employer], Population Health Case Manager Phone: [phone]

## 2023-04-21 DIAGNOSIS — I259 Chronic ischemic heart disease, unspecified: Secondary | ICD-10-CM | POA: Diagnosis not present

## 2023-04-22 DIAGNOSIS — I259 Chronic ischemic heart disease, unspecified: Secondary | ICD-10-CM | POA: Diagnosis not present

## 2023-04-23 DIAGNOSIS — I259 Chronic ischemic heart disease, unspecified: Secondary | ICD-10-CM | POA: Diagnosis not present

## 2023-04-23 DIAGNOSIS — I350 Nonrheumatic aortic (valve) stenosis: Secondary | ICD-10-CM | POA: Diagnosis not present

## 2023-04-23 DIAGNOSIS — I517 Cardiomegaly: Secondary | ICD-10-CM | POA: Diagnosis not present

## 2023-04-23 DIAGNOSIS — J9811 Atelectasis: Secondary | ICD-10-CM | POA: Diagnosis not present

## 2023-04-24 DIAGNOSIS — R079 Chest pain, unspecified: Secondary | ICD-10-CM | POA: Diagnosis not present

## 2023-04-24 DIAGNOSIS — R931 Abnormal findings on diagnostic imaging of heart and coronary circulation: Secondary | ICD-10-CM | POA: Diagnosis not present

## 2023-04-24 DIAGNOSIS — Z006 Encounter for examination for normal comparison and control in clinical research program: Secondary | ICD-10-CM | POA: Diagnosis not present

## 2023-04-24 DIAGNOSIS — Z4682 Encounter for fitting and adjustment of non-vascular catheter: Secondary | ICD-10-CM | POA: Diagnosis not present

## 2023-04-24 DIAGNOSIS — Z952 Presence of prosthetic heart valve: Secondary | ICD-10-CM | POA: Diagnosis not present

## 2023-04-24 DIAGNOSIS — Z452 Encounter for adjustment and management of vascular access device: Secondary | ICD-10-CM | POA: Diagnosis not present

## 2023-04-24 DIAGNOSIS — R918 Other nonspecific abnormal finding of lung field: Secondary | ICD-10-CM | POA: Diagnosis not present

## 2023-04-24 DIAGNOSIS — I350 Nonrheumatic aortic (valve) stenosis: Secondary | ICD-10-CM | POA: Diagnosis not present

## 2023-04-25 DIAGNOSIS — R931 Abnormal findings on diagnostic imaging of heart and coronary circulation: Secondary | ICD-10-CM | POA: Diagnosis not present

## 2023-04-25 DIAGNOSIS — I259 Chronic ischemic heart disease, unspecified: Secondary | ICD-10-CM | POA: Diagnosis not present

## 2023-04-25 DIAGNOSIS — Z952 Presence of prosthetic heart valve: Secondary | ICD-10-CM | POA: Insufficient documentation

## 2023-04-25 DIAGNOSIS — I350 Nonrheumatic aortic (valve) stenosis: Secondary | ICD-10-CM | POA: Diagnosis not present

## 2023-04-25 DIAGNOSIS — Z452 Encounter for adjustment and management of vascular access device: Secondary | ICD-10-CM | POA: Diagnosis not present

## 2023-04-25 DIAGNOSIS — Z4682 Encounter for fitting and adjustment of non-vascular catheter: Secondary | ICD-10-CM | POA: Diagnosis not present

## 2023-04-26 ENCOUNTER — Other Ambulatory Visit: Payer: Self-pay

## 2023-04-26 DIAGNOSIS — Z4682 Encounter for fitting and adjustment of non-vascular catheter: Secondary | ICD-10-CM | POA: Diagnosis not present

## 2023-04-26 DIAGNOSIS — R918 Other nonspecific abnormal finding of lung field: Secondary | ICD-10-CM | POA: Diagnosis not present

## 2023-04-26 DIAGNOSIS — I350 Nonrheumatic aortic (valve) stenosis: Secondary | ICD-10-CM | POA: Diagnosis not present

## 2023-04-26 DIAGNOSIS — Z452 Encounter for adjustment and management of vascular access device: Secondary | ICD-10-CM | POA: Diagnosis not present

## 2023-04-26 DIAGNOSIS — I259 Chronic ischemic heart disease, unspecified: Secondary | ICD-10-CM | POA: Diagnosis not present

## 2023-04-27 ENCOUNTER — Telehealth: Payer: Self-pay

## 2023-04-27 ENCOUNTER — Other Ambulatory Visit: Payer: Self-pay

## 2023-04-27 NOTE — Transitions of Care (Post Inpatient/ED Visit) (Signed)
04/27/2023  Name: [name] MRN: [id number] DOB: 11/28/1935  Today's TOC FU Call Status: Today's TOC FU Call Status:: Successful TOC FU Call Completed TOC FU Call Complete Date: 04/27/23 Patient's Name and Date of Birth confirmed.  Transition Care Management Follow-up Telephone Call Date of Discharge: 04/26/23 Discharge Facility: Other ([facility]) Name of Other ([facility]) Discharge Facility: [facility] Type of Discharge: Inpatient Admission Primary Inpatient Discharge Diagnosis:: TAVR How have you been since you were released from the hospital?: Better Any questions or concerns?: No  Items Reviewed: Did you receive and understand the discharge instructions provided?: Yes Medications obtained,verified, and reconciled?: Yes (Medications Reviewed) Any new allergies since your discharge?: No Dietary orders reviewed?: Yes Type of Diet Ordered:: Low Sosium Heart healthy Do you have support at home?: Yes Name of Support/Comfort Primary Source: [name], granddaughter  Medications Reviewed Today: Medications Reviewed Today     Reviewed by Redge Gainer, RN (Case Manager) on 04/27/23 at 1035  Med List Status: <None>   Medication Order Taking? Sig Documenting Provider Last Dose Status Informant  ALPRAZolam (XANAX) 0.5 MG tablet [id number] No TAKE 1 TABLET BY MOUTH TWICE DAILY FOR ANXIETY *REFILL REQUEST* Joaquim Nam, MD Taking Active   apixaban (ELIQUIS) 2.5 MG TABS tablet [id number] No Take 1 tablet (2.5 mg total) by mouth 2 (two) times daily. Joaquim Nam, MD Taking Active   atorvastatin (LIPITOR) 80 MG tablet [id number] No Take 1 tablet (80 mg total) by mouth every evening. Joaquim Nam, MD Taking Active   Cholecalciferol (VITAMIN D) 2000 units CAPS [id number] No Take 2,000 Units by mouth every morning. [provider] Taking Active Self  colchicine 0.6 MG tablet [id number] No Take 0.5 tablets (0.3 mg total) by mouth daily as needed (for gout).  Joaquim Nam, MD Taking Active Self           Med Note [name] Feb 16, 2023 11:05 AM)    diltiazem (CARDIZEM CD) 180 MG 24 hr capsule [id number] No Take 1 capsule (180 mg total) by mouth daily. Joaquim Nam, MD Taking Active   Famotidine (ACID REDUCER PO) [id number] No Take 1 tablet by mouth 2 (two) times daily.  Patient not taking: Reported on 04/12/2023   [provider] Not Taking Active Self  fluticasone (FLONASE) 50 MCG/ACT nasal spray [id number] No USE TWO SPRAYS in each nostril ONCE DAILY  Patient not taking: Reported on 04/12/2023   Joaquim Nam, MD Not Taking Active Self  gabapentin (NEURONTIN) 100 MG capsule [id number] No Take 1 capsule (100 mg total) by mouth 3 (three) times daily. Joaquim Nam, MD Taking Active   isosorbide mononitrate (IMDUR) 30 MG 24 hr tablet [id number] No Take 1 tablet (30 mg total) by mouth daily. Joaquim Nam, MD Taking Active   lisinopril (ZESTRIL) 5 MG tablet [id number] No Take 1 tablet (5 mg total) by mouth daily. Joaquim Nam, MD Taking Active   omeprazole (PRILOSEC) 20 MG capsule [id number]  Take 1 capsule (20 mg total) by mouth daily. Joaquim Nam, MD  Active   sertraline (ZOLOFT) 100 MG tablet [id number] No TAKE 1 AND 1/2 TABLETS BY MOUTH ONCE DAILY Joaquim Nam, MD Taking Active   vitamin B-12 (CYANOCOBALAMIN) 1000 MCG tablet [id number] No Take 1,000 mcg by mouth at bedtime. [provider] Taking Active Self            Home Care and Equipment/Supplies: Were Home Health Services Ordered?: NA Any  new equipment or medical supplies ordered?: NA  Functional Questionnaire: Do you need assistance with bathing/showering or dressing?: No Do you need assistance with meal preparation?: No Do you need assistance with eating?: No Do you have difficulty maintaining continence: No Do you need assistance with getting out of bed/getting out of a chair/moving?: No Do you have difficulty managing or taking  your medications?: No  Follow up appointments reviewed: PCP Follow-up appointment confirmed?: NA Specialist Hospital Follow-up appointment confirmed?: Yes Date of Specialist follow-up appointment?: 05/12/23 Follow-Up Specialty Provider:: [facility] Do you need transportation to your follow-up appointment?: No Do you understand care options if your condition(s) worsen?: Yes-patient verbalized understanding  SDOH Interventions Today    Flowsheet Row Most Recent Value  SDOH Interventions   Food Insecurity Interventions Intervention Not Indicated  Housing Interventions Intervention Not Indicated  Transportation Interventions Intervention Not Indicated  Utilities Interventions Intervention Not Indicated      Interventions Today    Flowsheet Row Most Recent Value  Chronic Disease   Chronic disease during today's visit Other  [Heart Disease]  General Interventions   General Interventions Discussed/Reviewed General Interventions Discussed  Exercise Interventions   Exercise Discussed/Reviewed Physical Activity  Physical Activity Discussed/Reviewed Physical Activity Discussed  Education Interventions   Education Provided Provided Education  Provided Verbal Education On Insurance Plans, Exercise, Medication  [Reviewed transportation benefit through her insurance]  Nutrition Interventions   Nutrition Discussed/Reviewed Nutrition Discussed  Pharmacy Interventions   Pharmacy Dicussed/Reviewed Medications and their functions  Safety Interventions   Safety Discussed/Reviewed Fall Risk        The patient had a successful T[facility]R at [facility]. She declines follow up with her PCP and prefers to follow with [facility] providers. She is independent with ADL's and has been up and walking a cooking her food. She has no pain. No Home Health needed. Noted SDOH of transportation issues and she states she has rides to her appointment. She has been followed by CCM RN and RNCM to reach out and see  if she want to continue to follow.   The patient has been provided with contact information for the care management team and has been advised to call with any health-related questions or concerns. The patient verbalized understanding with current POC. The patient is directed to their insurance card regarding availability of benefits coverage.   Deidre Ala, BSN, RN [employer]  [facility] - [employer] [facility] RN Care Manager [phone]

## 2023-05-01 ENCOUNTER — Ambulatory Visit: Payer: Self-pay

## 2023-05-01 NOTE — Patient Outreach (Signed)
  Care Coordination   05/01/2023 Name: [name] MRN: [id number] DOB: 06-25-1935   Care Coordination Outreach Attempts:  An unsuccessful outreach was attempted for an appointment today. Unable to reach patient or leave voice message due to patients voice mail being full.   Follow Up Plan:  Additional outreach attempts will be made to offer the patient complex care management information and services.   Encounter Outcome:  No Answer   Care Coordination Interventions:  No, not indicated    [name] Ina RN, BSN, CCM [employer], Population Health Case Manager Phone: [phone]

## 2023-05-03 ENCOUNTER — Other Ambulatory Visit: Payer: Self-pay

## 2023-05-04 ENCOUNTER — Other Ambulatory Visit (HOSPITAL_COMMUNITY): Payer: Self-pay

## 2023-05-04 ENCOUNTER — Other Ambulatory Visit: Payer: Self-pay

## 2023-05-08 ENCOUNTER — Other Ambulatory Visit: Payer: Self-pay

## 2023-05-08 DIAGNOSIS — I480 Paroxysmal atrial fibrillation: Secondary | ICD-10-CM | POA: Diagnosis not present

## 2023-05-08 DIAGNOSIS — Z952 Presence of prosthetic heart valve: Secondary | ICD-10-CM | POA: Diagnosis not present

## 2023-05-08 DIAGNOSIS — I10 Essential (primary) hypertension: Secondary | ICD-10-CM | POA: Diagnosis not present

## 2023-05-08 DIAGNOSIS — D696 Thrombocytopenia, unspecified: Secondary | ICD-10-CM | POA: Diagnosis not present

## 2023-05-08 DIAGNOSIS — E782 Mixed hyperlipidemia: Secondary | ICD-10-CM | POA: Diagnosis not present

## 2023-05-08 DIAGNOSIS — I350 Nonrheumatic aortic (valve) stenosis: Secondary | ICD-10-CM | POA: Diagnosis not present

## 2023-05-08 DIAGNOSIS — Z792 Long term (current) use of antibiotics: Secondary | ICD-10-CM | POA: Diagnosis not present

## 2023-05-08 DIAGNOSIS — I2510 Atherosclerotic heart disease of native coronary artery without angina pectoris: Secondary | ICD-10-CM | POA: Diagnosis not present

## 2023-05-08 DIAGNOSIS — R001 Bradycardia, unspecified: Secondary | ICD-10-CM | POA: Diagnosis not present

## 2023-05-09 ENCOUNTER — Other Ambulatory Visit: Payer: Self-pay

## 2023-05-09 DIAGNOSIS — D696 Thrombocytopenia, unspecified: Secondary | ICD-10-CM | POA: Diagnosis not present

## 2023-05-11 ENCOUNTER — Other Ambulatory Visit (HOSPITAL_BASED_OUTPATIENT_CLINIC_OR_DEPARTMENT_OTHER): Payer: Self-pay

## 2023-05-11 ENCOUNTER — Telehealth: Payer: Self-pay | Admitting: *Deleted

## 2023-05-11 NOTE — Progress Notes (Signed)
 Complex Care Management Care Guide Note  05/11/2023 Name: [name] MRN: [id number] DOB: 09/27/1935  [name] is a 88 y.o. year old female who is a primary care patient of Joaquim Nam, MD and is actively engaged with the care management team. I reached out to [name] by phone today to assist with re-scheduling  with the RN Case Manager.  Follow up plan: Unsuccessful telephone outreach attempt made. A HIPAA compliant phone message was left for the patient providing contact information and requesting a return call.  Burman Nieves, CMA, Care Guide [facility]  [facility], [facility] Guide Direct Dial: [phone]  Fax: [phone] Website: [email or web address]

## 2023-05-15 ENCOUNTER — Other Ambulatory Visit: Payer: Self-pay

## 2023-05-17 NOTE — Progress Notes (Signed)
 Complex Care Management Care Guide Note  05/17/2023 Name: [name] MRN: [id number] DOB: [birth year]  [name] is a 88 y.o. year old female who is a primary care patient of Joaquim Nam, MD and is actively engaged with the care management team. I reached out to [name] by phone today to assist with re-scheduling  with the RN Case Manager.  Follow up plan: Unsuccessful telephone outreach attempt made. A HIPAA compliant phone message was left for the patient providing contact information and requesting a return call.NO additional outreach attempts will be made due to inability to maintain patient contact.   Burman Nieves, CMA, Care Guide [facility]  [facility], [facility] Guide Direct Dial: [phone]  Fax: [phone] Website: [name]

## 2023-05-18 ENCOUNTER — Other Ambulatory Visit: Payer: Self-pay

## 2023-05-19 ENCOUNTER — Telehealth: Payer: Self-pay

## 2023-05-19 NOTE — Patient Outreach (Signed)
 Care Coordination   Follow Up Visit Note   05/19/2023 Name: [name] MRN: [id number] DOB: 04-17-1935  [name] is a 88 y.o. year old female who sees Joaquim Nam, MD for primary care. Notification received from [facility] care guide of inability to re-establish contact with patient. Goals closed.      Goals Addressed             This Visit's Progress    COMPLETED: Patient will work with RN case manager to gain knowledge and management of health conditions.       Interventions Today    Flowsheet Row Most Recent Value  Chronic Disease   Chronic disease during today's visit Hypertension (HTN), Other  [chronic ischemic heart disease / chest pain]  General Interventions   General Interventions Discussed/Reviewed General Interventions Reviewed, Doctor Visits  [evaluation of current treatment plan for listed health conditions and patients adherence to plan as established by provider. Assessed for ongoing CP/ back/ neck shoulder pain symptoms and BP readings.]  Doctor Visits Discussed/Reviewed Doctor Visits Reviewed  Annabell Sabal upcoming provider visits. Inquired if patient has been contacted by cardiothoracic surgeon office to schedule appointment.  Keep follow up appointments with providers as recommended. Notify RNCM if no return call from cardiology office.]  Education Interventions   Education Provided Provided Education  [Discussed need to be seen by ED versus urgent care for severe chest pain.  Advised to call 911 for severe CP/ SOB symptoms. Advised to call provider for any new or ongoing symptoms. Advised to have granddaughter on phone when calling insurance provider.]  Provided Verbal Education On Other  [Discussed with patient benefit insurance access. Confirmed patient has talked with benefit provider and inquired if additional assistance was needed. Advised to monitor BP and record reporting readings outside of established parameter to provider.]  Pharmacy  Interventions   Pharmacy Dicussed/Reviewed Pharmacy Topics Reviewed  [medications reviewed and compliance discussed and advised.]  Safety Interventions   Safety Discussed/Reviewed Fall Risk, Safety Discussed  [Assessed for falls. Discussed fall prevention/ safety.  Sent patient education article on falls prevention by MyChart.]              SDOH assessments and interventions completed:  No     Care Coordination Interventions:  No, not indicated   Follow up plan: No further intervention required.   Encounter Outcome:  Patient closed.   [name] Ina RN, BSN, CCM [employer], Population Health Case Manager Phone: [phone]

## 2023-05-23 DIAGNOSIS — D649 Anemia, unspecified: Secondary | ICD-10-CM | POA: Diagnosis not present

## 2023-05-23 DIAGNOSIS — Z8719 Personal history of other diseases of the digestive system: Secondary | ICD-10-CM | POA: Diagnosis not present

## 2023-05-23 DIAGNOSIS — Z7901 Long term (current) use of anticoagulants: Secondary | ICD-10-CM | POA: Diagnosis not present

## 2023-05-23 DIAGNOSIS — Z952 Presence of prosthetic heart valve: Secondary | ICD-10-CM | POA: Diagnosis not present

## 2023-05-23 DIAGNOSIS — K2950 Unspecified chronic gastritis without bleeding: Secondary | ICD-10-CM | POA: Diagnosis not present

## 2023-05-30 DIAGNOSIS — R001 Bradycardia, unspecified: Secondary | ICD-10-CM | POA: Diagnosis not present

## 2023-05-30 DIAGNOSIS — Z48812 Encounter for surgical aftercare following surgery on the circulatory system: Secondary | ICD-10-CM | POA: Diagnosis not present

## 2023-05-30 DIAGNOSIS — Z7901 Long term (current) use of anticoagulants: Secondary | ICD-10-CM | POA: Diagnosis not present

## 2023-05-30 DIAGNOSIS — I350 Nonrheumatic aortic (valve) stenosis: Secondary | ICD-10-CM | POA: Diagnosis not present

## 2023-05-30 DIAGNOSIS — Z952 Presence of prosthetic heart valve: Secondary | ICD-10-CM | POA: Diagnosis not present

## 2023-05-30 DIAGNOSIS — Z7902 Long term (current) use of antithrombotics/antiplatelets: Secondary | ICD-10-CM | POA: Diagnosis not present

## 2023-05-31 ENCOUNTER — Other Ambulatory Visit: Payer: Self-pay

## 2023-05-31 ENCOUNTER — Emergency Department

## 2023-05-31 ENCOUNTER — Encounter: Payer: 59 | Admitting: Surgery

## 2023-05-31 DIAGNOSIS — I480 Paroxysmal atrial fibrillation: Secondary | ICD-10-CM | POA: Diagnosis present

## 2023-05-31 DIAGNOSIS — I6529 Occlusion and stenosis of unspecified carotid artery: Secondary | ICD-10-CM | POA: Diagnosis not present

## 2023-05-31 DIAGNOSIS — M898X8 Other specified disorders of bone, other site: Secondary | ICD-10-CM | POA: Diagnosis not present

## 2023-05-31 DIAGNOSIS — S0003XA Contusion of scalp, initial encounter: Secondary | ICD-10-CM | POA: Diagnosis present

## 2023-05-31 DIAGNOSIS — I2510 Atherosclerotic heart disease of native coronary artery without angina pectoris: Secondary | ICD-10-CM | POA: Diagnosis not present

## 2023-05-31 DIAGNOSIS — K219 Gastro-esophageal reflux disease without esophagitis: Secondary | ICD-10-CM | POA: Diagnosis not present

## 2023-05-31 DIAGNOSIS — I350 Nonrheumatic aortic (valve) stenosis: Secondary | ICD-10-CM | POA: Diagnosis not present

## 2023-05-31 DIAGNOSIS — Z961 Presence of intraocular lens: Secondary | ICD-10-CM | POA: Diagnosis present

## 2023-05-31 DIAGNOSIS — M109 Gout, unspecified: Secondary | ICD-10-CM | POA: Diagnosis present

## 2023-05-31 DIAGNOSIS — Z7901 Long term (current) use of anticoagulants: Secondary | ICD-10-CM

## 2023-05-31 DIAGNOSIS — I739 Peripheral vascular disease, unspecified: Secondary | ICD-10-CM | POA: Diagnosis not present

## 2023-05-31 DIAGNOSIS — W19XXXA Unspecified fall, initial encounter: Secondary | ICD-10-CM | POA: Diagnosis present

## 2023-05-31 DIAGNOSIS — Z9841 Cataract extraction status, right eye: Secondary | ICD-10-CM

## 2023-05-31 DIAGNOSIS — Z953 Presence of xenogenic heart valve: Secondary | ICD-10-CM | POA: Diagnosis not present

## 2023-05-31 DIAGNOSIS — N390 Urinary tract infection, site not specified: Secondary | ICD-10-CM | POA: Diagnosis not present

## 2023-05-31 DIAGNOSIS — Z952 Presence of prosthetic heart valve: Secondary | ICD-10-CM

## 2023-05-31 DIAGNOSIS — M25512 Pain in left shoulder: Secondary | ICD-10-CM | POA: Diagnosis present

## 2023-05-31 DIAGNOSIS — I6782 Cerebral ischemia: Secondary | ICD-10-CM | POA: Diagnosis not present

## 2023-05-31 DIAGNOSIS — M545 Low back pain, unspecified: Secondary | ICD-10-CM | POA: Diagnosis not present

## 2023-05-31 DIAGNOSIS — Z96651 Presence of right artificial knee joint: Secondary | ICD-10-CM | POA: Diagnosis not present

## 2023-05-31 DIAGNOSIS — Z8249 Family history of ischemic heart disease and other diseases of the circulatory system: Secondary | ICD-10-CM | POA: Diagnosis not present

## 2023-05-31 DIAGNOSIS — E782 Mixed hyperlipidemia: Secondary | ICD-10-CM | POA: Diagnosis not present

## 2023-05-31 DIAGNOSIS — Z86718 Personal history of other venous thrombosis and embolism: Secondary | ICD-10-CM | POA: Diagnosis not present

## 2023-05-31 DIAGNOSIS — Z1152 Encounter for screening for COVID-19: Secondary | ICD-10-CM | POA: Diagnosis not present

## 2023-05-31 DIAGNOSIS — I672 Cerebral atherosclerosis: Secondary | ICD-10-CM | POA: Diagnosis not present

## 2023-05-31 DIAGNOSIS — Z9842 Cataract extraction status, left eye: Secondary | ICD-10-CM | POA: Diagnosis not present

## 2023-05-31 DIAGNOSIS — Z7902 Long term (current) use of antithrombotics/antiplatelets: Secondary | ICD-10-CM | POA: Diagnosis not present

## 2023-05-31 DIAGNOSIS — Z880 Allergy status to penicillin: Secondary | ICD-10-CM

## 2023-05-31 DIAGNOSIS — E785 Hyperlipidemia, unspecified: Secondary | ICD-10-CM | POA: Diagnosis present

## 2023-05-31 DIAGNOSIS — Z79899 Other long term (current) drug therapy: Secondary | ICD-10-CM | POA: Diagnosis not present

## 2023-05-31 DIAGNOSIS — I10 Essential (primary) hypertension: Secondary | ICD-10-CM | POA: Diagnosis not present

## 2023-05-31 DIAGNOSIS — F419 Anxiety disorder, unspecified: Secondary | ICD-10-CM | POA: Diagnosis present

## 2023-05-31 DIAGNOSIS — F32A Depression, unspecified: Secondary | ICD-10-CM | POA: Diagnosis present

## 2023-05-31 DIAGNOSIS — M25519 Pain in unspecified shoulder: Secondary | ICD-10-CM | POA: Diagnosis not present

## 2023-05-31 DIAGNOSIS — M549 Dorsalgia, unspecified: Secondary | ICD-10-CM | POA: Diagnosis not present

## 2023-05-31 DIAGNOSIS — M778 Other enthesopathies, not elsewhere classified: Secondary | ICD-10-CM | POA: Diagnosis not present

## 2023-05-31 DIAGNOSIS — I951 Orthostatic hypotension: Secondary | ICD-10-CM | POA: Diagnosis not present

## 2023-05-31 DIAGNOSIS — D696 Thrombocytopenia, unspecified: Secondary | ICD-10-CM | POA: Diagnosis not present

## 2023-05-31 DIAGNOSIS — Z888 Allergy status to other drugs, medicaments and biological substances status: Secondary | ICD-10-CM

## 2023-05-31 DIAGNOSIS — R55 Syncope and collapse: Secondary | ICD-10-CM | POA: Diagnosis not present

## 2023-05-31 DIAGNOSIS — Z955 Presence of coronary angioplasty implant and graft: Secondary | ICD-10-CM

## 2023-05-31 DIAGNOSIS — Y929 Unspecified place or not applicable: Secondary | ICD-10-CM

## 2023-05-31 LAB — CBC
HCT: 35.3 % — ABNORMAL LOW (ref 36.0–46.0)
Hemoglobin: 11.3 g/dL — ABNORMAL LOW (ref 12.0–15.0)
MCH: 29.6 pg (ref 26.0–34.0)
MCHC: 32 g/dL (ref 30.0–36.0)
MCV: 92.4 fL (ref 80.0–100.0)
Platelets: 113 10*3/uL — ABNORMAL LOW (ref 150–400)
RBC: 3.82 MIL/uL — ABNORMAL LOW (ref 3.87–5.11)
RDW: 15.1 % (ref 11.5–15.5)
WBC: 5.7 10*3/uL (ref 4.0–10.5)
nRBC: 0 % (ref 0.0–0.2)

## 2023-05-31 LAB — BASIC METABOLIC PANEL
Anion gap: 8 (ref 5–15)
BUN: 20 mg/dL (ref 8–23)
CO2: 27 mmol/L (ref 22–32)
Calcium: 8.6 mg/dL — ABNORMAL LOW (ref 8.9–10.3)
Chloride: 104 mmol/L (ref 98–111)
Creatinine, Ser: 0.8 mg/dL (ref 0.44–1.00)
GFR, Estimated: >60 mL/min (ref >60)
Glucose, Bld: 90 mg/dL (ref 70–99)
Potassium: 4.3 mmol/L (ref 3.5–5.1)
Sodium: 139 mmol/L (ref 135–145)

## 2023-05-31 LAB — TROPONIN I (HIGH SENSITIVITY)
Troponin I (High Sensitivity): 11 ng/L (ref <18)
Troponin I (High Sensitivity): 11 ng/L (ref <18)

## 2023-05-31 NOTE — ED Triage Notes (Signed)
 Pt BIB EMS with c/o syncope while she was on the toilet. Per pt, she was using the bathroom and woke up on the floor. Pt c/o back pain and L shoulder pain. Pt did hit her head and pt is on eliquis. Pt has hx of Aortic valve stenosis and had 2 cardiac stents placed recently. Pt a&ox4.

## 2023-05-31 NOTE — ED Triage Notes (Signed)
 First Nurse Note;  Pt via [facility] from home. Pt was using the bathroom, reports a near syncopal episode. Pt fell between the toilet and tub, hematoma to posterior aspect of her head. Pt takes Eliquis. Pt c/o lower back pain, L shoulder pain. Pt is A&OX4 and NAD NAD  159/72 BP

## 2023-06-01 ENCOUNTER — Observation Stay

## 2023-06-01 ENCOUNTER — Inpatient Hospital Stay
Admission: EM | Admit: 2023-06-01 | Discharge: 2023-06-02 | DRG: 312 | Disposition: A | Discharge status: Home / Self Care | Attending: Internal Medicine | Admitting: Internal Medicine

## 2023-06-01 ENCOUNTER — Other Ambulatory Visit: Payer: Self-pay

## 2023-06-01 DIAGNOSIS — Z7902 Long term (current) use of antithrombotics/antiplatelets: Secondary | ICD-10-CM | POA: Diagnosis not present

## 2023-06-01 DIAGNOSIS — I10 Essential (primary) hypertension: Secondary | ICD-10-CM | POA: Diagnosis present

## 2023-06-01 DIAGNOSIS — Z952 Presence of prosthetic heart valve: Secondary | ICD-10-CM

## 2023-06-01 DIAGNOSIS — N390 Urinary tract infection, site not specified: Secondary | ICD-10-CM

## 2023-06-01 DIAGNOSIS — E785 Hyperlipidemia, unspecified: Secondary | ICD-10-CM | POA: Diagnosis present

## 2023-06-01 DIAGNOSIS — Z961 Presence of intraocular lens: Secondary | ICD-10-CM | POA: Diagnosis present

## 2023-06-01 DIAGNOSIS — R55 Syncope and collapse: Secondary | ICD-10-CM | POA: Diagnosis present

## 2023-06-01 DIAGNOSIS — Z9842 Cataract extraction status, left eye: Secondary | ICD-10-CM | POA: Diagnosis not present

## 2023-06-01 DIAGNOSIS — F32A Depression, unspecified: Secondary | ICD-10-CM | POA: Diagnosis present

## 2023-06-01 DIAGNOSIS — F419 Anxiety disorder, unspecified: Secondary | ICD-10-CM | POA: Diagnosis present

## 2023-06-01 DIAGNOSIS — Z7901 Long term (current) use of anticoagulants: Secondary | ICD-10-CM | POA: Diagnosis not present

## 2023-06-01 DIAGNOSIS — M25519 Pain in unspecified shoulder: Secondary | ICD-10-CM | POA: Diagnosis present

## 2023-06-01 DIAGNOSIS — Z8249 Family history of ischemic heart disease and other diseases of the circulatory system: Secondary | ICD-10-CM | POA: Diagnosis not present

## 2023-06-01 DIAGNOSIS — W19XXXA Unspecified fall, initial encounter: Secondary | ICD-10-CM | POA: Diagnosis present

## 2023-06-01 DIAGNOSIS — Z1152 Encounter for screening for COVID-19: Secondary | ICD-10-CM | POA: Diagnosis not present

## 2023-06-01 DIAGNOSIS — D696 Thrombocytopenia, unspecified: Secondary | ICD-10-CM | POA: Diagnosis present

## 2023-06-01 DIAGNOSIS — K219 Gastro-esophageal reflux disease without esophagitis: Secondary | ICD-10-CM | POA: Diagnosis present

## 2023-06-01 DIAGNOSIS — Z79899 Other long term (current) drug therapy: Secondary | ICD-10-CM | POA: Diagnosis not present

## 2023-06-01 DIAGNOSIS — K922 Gastrointestinal hemorrhage, unspecified: Secondary | ICD-10-CM | POA: Diagnosis present

## 2023-06-01 DIAGNOSIS — I480 Paroxysmal atrial fibrillation: Secondary | ICD-10-CM

## 2023-06-01 DIAGNOSIS — I2510 Atherosclerotic heart disease of native coronary artery without angina pectoris: Secondary | ICD-10-CM | POA: Diagnosis present

## 2023-06-01 DIAGNOSIS — E782 Mixed hyperlipidemia: Secondary | ICD-10-CM

## 2023-06-01 DIAGNOSIS — Y929 Unspecified place or not applicable: Secondary | ICD-10-CM | POA: Diagnosis not present

## 2023-06-01 DIAGNOSIS — Z880 Allergy status to penicillin: Secondary | ICD-10-CM | POA: Diagnosis not present

## 2023-06-01 DIAGNOSIS — I739 Peripheral vascular disease, unspecified: Secondary | ICD-10-CM | POA: Diagnosis present

## 2023-06-01 DIAGNOSIS — I951 Orthostatic hypotension: Secondary | ICD-10-CM

## 2023-06-01 DIAGNOSIS — Z96651 Presence of right artificial knee joint: Secondary | ICD-10-CM | POA: Diagnosis present

## 2023-06-01 DIAGNOSIS — Z953 Presence of xenogenic heart valve: Secondary | ICD-10-CM | POA: Diagnosis not present

## 2023-06-01 DIAGNOSIS — Z9841 Cataract extraction status, right eye: Secondary | ICD-10-CM | POA: Diagnosis not present

## 2023-06-01 DIAGNOSIS — S0003XA Contusion of scalp, initial encounter: Secondary | ICD-10-CM | POA: Diagnosis present

## 2023-06-01 DIAGNOSIS — Z86718 Personal history of other venous thrombosis and embolism: Secondary | ICD-10-CM | POA: Diagnosis not present

## 2023-06-01 DIAGNOSIS — Z955 Presence of coronary angioplasty implant and graft: Secondary | ICD-10-CM | POA: Diagnosis not present

## 2023-06-01 DIAGNOSIS — I4891 Unspecified atrial fibrillation: Secondary | ICD-10-CM | POA: Diagnosis present

## 2023-06-01 LAB — BASIC METABOLIC PANEL
Anion gap: 6 (ref 5–15)
BUN: 17 mg/dL (ref 8–23)
CO2: 24 mmol/L (ref 22–32)
Calcium: 8.1 mg/dL — ABNORMAL LOW (ref 8.9–10.3)
Chloride: 107 mmol/L (ref 98–111)
Creatinine, Ser: 0.7 mg/dL (ref 0.44–1.00)
GFR, Estimated: >60 mL/min (ref >60)
Glucose, Bld: 85 mg/dL (ref 70–99)
Potassium: 3.9 mmol/L (ref 3.5–5.1)
Sodium: 137 mmol/L (ref 135–145)

## 2023-06-01 LAB — RESP PANEL BY RT-PCR (RSV, FLU A&B, COVID)  RVPGX2
Influenza A by PCR: NEGATIVE
Influenza B by PCR: NEGATIVE
Resp Syncytial Virus by PCR: NEGATIVE
SARS Coronavirus 2 by RT PCR: NEGATIVE

## 2023-06-01 LAB — CBC
HCT: 32.1 % — ABNORMAL LOW (ref 36.0–46.0)
Hemoglobin: 10.7 g/dL — ABNORMAL LOW (ref 12.0–15.0)
MCH: 29.4 pg (ref 26.0–34.0)
MCHC: 33.3 g/dL (ref 30.0–36.0)
MCV: 88.2 fL (ref 80.0–100.0)
Platelets: 106 10*3/uL — ABNORMAL LOW (ref 150–400)
RBC: 3.64 MIL/uL — ABNORMAL LOW (ref 3.87–5.11)
RDW: 15.1 % (ref 11.5–15.5)
WBC: 4.7 10*3/uL (ref 4.0–10.5)
nRBC: 0 % (ref 0.0–0.2)

## 2023-06-01 LAB — URINALYSIS, ROUTINE W REFLEX MICROSCOPIC
Bacteria, UA: NONE SEEN
Bilirubin Urine: NEGATIVE
Glucose, UA: NEGATIVE mg/dL
Hgb urine dipstick: NEGATIVE
Ketones, ur: 5 mg/dL — AB
Nitrite: NEGATIVE
Protein, ur: NEGATIVE mg/dL
Specific Gravity, Urine: 1.027 (ref 1.005–1.030)
pH: 5 (ref 5.0–8.0)

## 2023-06-01 LAB — MAGNESIUM: Magnesium: 2 mg/dL (ref 1.7–2.4)

## 2023-06-01 MED ORDER — PANTOPRAZOLE SODIUM 40 MG PO TBEC
40.0000 mg | DELAYED_RELEASE_TABLET | Freq: Every day | ORAL | Status: DC
  Administered 2023-06-01 – 2023-06-02 (×2): 40 mg via ORAL
  Filled 2023-06-01 (×2): qty 1

## 2023-06-01 MED ORDER — CLOPIDOGREL BISULFATE 75 MG PO TABS
75.0000 mg | ORAL_TABLET | Freq: Every day | ORAL | Status: DC
  Administered 2023-06-02: 75 mg via ORAL
  Filled 2023-06-01: qty 1

## 2023-06-01 MED ORDER — SODIUM CHLORIDE 0.9 % IV BOLUS
1000.0000 mL | Freq: Once | INTRAVENOUS | Status: AC
  Administered 2023-06-01: 1000 mL via INTRAVENOUS

## 2023-06-01 MED ORDER — ACETAMINOPHEN 325 MG PO TABS
650.0000 mg | ORAL_TABLET | Freq: Four times a day (QID) | ORAL | Status: DC | PRN
  Administered 2023-06-01 (×2): 650 mg via ORAL
  Filled 2023-06-01 (×2): qty 2

## 2023-06-01 MED ORDER — LISINOPRIL 10 MG PO TABS
5.0000 mg | ORAL_TABLET | Freq: Every day | ORAL | Status: DC
  Administered 2023-06-01 – 2023-06-02 (×2): 5 mg via ORAL
  Filled 2023-06-01: qty 1

## 2023-06-01 MED ORDER — ACETAMINOPHEN 650 MG RE SUPP: 650.0000 mg | Freq: Four times a day (QID) | RECTAL | Status: DC | PRN

## 2023-06-01 MED ORDER — SODIUM CHLORIDE 0.9 % IV SOLN
100.0000 mg | Freq: Once | INTRAVENOUS | Status: AC
  Administered 2023-06-01: 100 mg via INTRAVENOUS
  Filled 2023-06-01: qty 100

## 2023-06-01 MED ORDER — ISOSORBIDE MONONITRATE ER 30 MG PO TB24
30.0000 mg | ORAL_TABLET | Freq: Every day | ORAL | Status: DC
  Administered 2023-06-01 – 2023-06-02 (×2): 30 mg via ORAL
  Filled 2023-06-01 (×2): qty 1

## 2023-06-01 MED ORDER — EZETIMIBE 10 MG PO TABS
10.0000 mg | ORAL_TABLET | Freq: Every day | ORAL | Status: DC
  Administered 2023-06-01 – 2023-06-02 (×2): 10 mg via ORAL
  Filled 2023-06-01 (×2): qty 1

## 2023-06-01 MED ORDER — ONDANSETRON HCL 4 MG PO TABS: 4.0000 mg | ORAL_TABLET | Freq: Four times a day (QID) | ORAL | Status: DC | PRN

## 2023-06-01 MED ORDER — SODIUM CHLORIDE 0.9% FLUSH
3.0000 mL | Freq: Two times a day (BID) | INTRAVENOUS | Status: DC
  Administered 2023-06-01 – 2023-06-02 (×3): 3 mL via INTRAVENOUS

## 2023-06-01 MED ORDER — ATORVASTATIN CALCIUM 20 MG PO TABS
80.0000 mg | ORAL_TABLET | Freq: Every evening | ORAL | Status: DC
  Administered 2023-06-01: 80 mg via ORAL
  Filled 2023-06-01: qty 4

## 2023-06-01 MED ORDER — SODIUM CHLORIDE 0.9 % IV SOLN: INTRAVENOUS | Status: AC

## 2023-06-01 MED ORDER — POLYETHYLENE GLYCOL 3350 17 G PO PACK: 17.0000 g | PACK | Freq: Every day | ORAL | Status: DC | PRN

## 2023-06-01 MED ORDER — ONDANSETRON HCL 4 MG/2ML IJ SOLN: 4.0000 mg | Freq: Four times a day (QID) | INTRAMUSCULAR | Status: DC | PRN

## 2023-06-01 MED ORDER — ACETAMINOPHEN 325 MG PO TABS
650.0000 mg | ORAL_TABLET | Freq: Once | ORAL | Status: AC
  Administered 2023-06-01: 650 mg via ORAL
  Filled 2023-06-01: qty 2

## 2023-06-01 MED ORDER — MELATONIN 5 MG PO TABS: 5.0000 mg | ORAL_TABLET | Freq: Every evening | ORAL | Status: DC | PRN
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
## 2023-06-01 NOTE — Progress Notes (Signed)
 Eeg done
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
## 2023-06-01 NOTE — Plan of Care (Signed)

## 2023-06-01 NOTE — Evaluation (Signed)
 Physical Therapy Evaluation Patient Details Name: [name] MRN: [id number] DOB: 08/24/1935 Today's Date: 06/01/2023  History of Present Illness  Pt is an 88 y.o. female brought to the ED via EMS from home after syncopal episode following urination resulting in a fall between her tub and toilet. All imaging negative. PMH of TAVR 04/24/23, back pain, depression, GERD, HLD, HTN, R TKA.   Clinical Impression  Patient alert, agreeable to PT, up in bathroom independently for bathing. Able to transfer, dress, and wipe down without assistance. She was able to then ambulate >3105ft with RW, no assistance needed, no complaints of dizziness or weakness. The patient demonstrated and reported return to baseline level of functioning, no further acute PT needs indicated. PT to sign off. Please reconsult PT if pt status changes or acute needs are identified.          If plan is discharge home, recommend the following: Assist for transportation;Help with stairs or ramp for entrance   Can travel by private vehicle        Equipment Recommendations None recommended by PT  Recommendations for Other Services       Functional Status Assessment Patient has not had a recent decline in their functional status     Precautions / Restrictions Precautions Precautions: Fall Restrictions Weight Bearing Restrictions Per Provider Order: No      Mobility  Bed Mobility               General bed mobility comments: NT    Transfers Overall transfer level: Modified independent                      Ambulation/Gait Ambulation/Gait assistance: Modified independent (Device/Increase time) Gait Distance (Feet): 350 Feet Assistive device: Rolling walker (2 wheels) Gait Pattern/deviations: WFL(Within Functional Limits)          Stairs            Wheelchair Mobility     Tilt Bed    Modified Rankin (Stroke Patients Only)       Balance Overall balance assessment: Mild  deficits observed, not formally tested   Sitting balance-Leahy Scale: Good       Standing balance-Leahy Scale: Good                               Pertinent Vitals/Pain Pain Assessment Pain Assessment: Faces Faces Pain Scale: No hurt Pain Location: LUE, pt reports it hurts a little if she moves it, but was able to bear weight through RW during session and reach for paper towel, etc with no issues during session    Home Living Family/patient expects to be discharged to:: Private residence Living Arrangements: Other relatives (great granddaughter and her 22 year old son) Available Help at Discharge: Family;Available 24 hours/day (great granddaughter works from home) Type of Home: House Home Access: Stairs to enter Entrance Stairs-Rails: Can reach Art gallery manager of Steps: 8   Home Layout: One level Home Equipment: Rollator (4 wheels);Shower seat      Prior Function Prior Level of Function : Independent/Modified Independent             Mobility Comments: report using a rollator for all mobility or pushing a grocery cart in the grocery store; niece or great granddaughter drive her where she needs; reports cardiopulmonary rehab was ordered by [facility] but she never started ADLs Comments: IND     Extremity/Trunk Assessment  Upper Extremity Assessment Upper Extremity Assessment: Defer to OT evaluation LUE Deficits / Details: soreness after fall    Lower Extremity Assessment Lower Extremity Assessment: Overall WFL for tasks assessed       Communication   Communication Communication: No apparent difficulties    Cognition Arousal: Alert Behavior During Therapy: WFL for tasks assessed/performed                             Following commands: Intact       Cueing Cueing Techniques: Verbal cues     General Comments General comments (skin integrity, edema, etc.): no c/o pain or dizziness during session    Exercises      Assessment/Plan    PT Assessment Patient does not need any further PT services  PT Problem List         PT Treatment Interventions      PT Goals (Current goals can be found in the Care Plan section)       Frequency       Co-evaluation               AM-PAC PT "6 Clicks" Mobility  Outcome Measure Help needed turning from your back to your side while in a flat bed without using bedrails?: None Help needed moving from lying on your back to sitting on the side of a flat bed without using bedrails?: None Help needed moving to and from a bed to a chair (including a wheelchair)?: None Help needed standing up from a chair using your arms (e.g., wheelchair or bedside chair)?: None Help needed to walk in hospital room?: None Help needed climbing 3-5 steps with a railing? : None 6 Click Score: 24    End of Session   Activity Tolerance: Patient tolerated treatment well Patient left: Other (comment) (seated EOB to eat lunch) Nurse Communication: Mobility status PT Visit Diagnosis: Other abnormalities of gait and mobility (R26.89)    Time: 0454-0981 PT Time Calculation (min) (ACUTE ONLY): 13 min   Charges:   PT Evaluation $PT Eval Low Complexity: 1 Low PT Treatments $Therapeutic Activity: 8-22 mins PT General Charges $$ ACUTE PT VISIT: 1 Visit       Olga Coaster PT, DPT 3:51 PM,06/01/23
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
## 2023-06-01 NOTE — Procedures (Signed)
 History: 88 year old female being evaluated for syncope  EEG duration: 24 minutes and 8 seconds  Sedation: None  Patient State: Awake and asleep  Technique: This EEG was acquired with electrodes placed according to the International 10-20 electrode system (including Fp1, Fp2, F3, F4, C3, C4, P3, P4, O1, O2, T3, T4, T5, T6, A1, A2, Fz, Cz, Pz). The following electrodes were missing or displaced: none.   Background: The background consists of intermixed alpha and beta activities. There is a well defined posterior dominant rhythm of 9 Hz that attenuates with eye opening. Sleep is recorded with normal appearing structures.   Photic stimulation: Physiologic driving is not performed  EEG Abnormalities: None  Clinical Interpretation: This normal EEG is recorded in the waking and sleep state. There was no seizure or seizure predisposition recorded on this study. Please note that lack of epileptiform activity on EEG does not preclude the possibility of epilepsy.   Ritta Slot, MD [facility]   If 7pm- 7am, please page neurology on call as listed in [facility].
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
## 2023-06-01 NOTE — Evaluation (Addendum)
 Occupational Therapy Evaluation Patient Details Name: [name] MRN: [id number] DOB: [birth year] Today's Date: 06/01/2023   History of Present Illness   Pt is an 88 y.o. female brought to the ED via EMS from home after syncopal episode following urination resulting in a fall between her tub and toilet. All imaging negative. PMH of TAVR 04/24/23, back pain, depression, GERD, HLD, HTN, R TKA     Clinical Impressions Pt was seen for OT evaluation this date. PTA, pt was living at home with her great granddaughter in a one level home with 8 STE-questionable if there's a ramp as one was mentioned on chart review a year or more ago. Pt reports ambulatory household and community distances with rollator. Denies falls outside of this one. IND with ADLs and great granddaughter is available to assist as she works from home. Niece can also drive her to appts, grocery store, etc.  Pt standing EOB with NT about to go to the bathroom on entry for urgent need. Pt began ambulating with no AD to the bathroom needing CGA for safety. Toilet transfer with SUP using grab bar and RW. IND for posterior hygiene seated after BM and for clothing management. Ambulated ~55 feet using RW with MOD I, no LOB reports baseline gait. Hand hygiene at sink with MOD I. Reports LUE discomfort with movement s/p fall however pt noted to utilize LUE on RW and during hand hygiene reaching for paper towel as well as AROM WFL. No dizziness reported during session. She appears at her baseline, wand will be discharged in house with no further therapy needs on DC. Pt interested in starting cardiopulmonary rehab as she was supposed to after her heart procedure.     If plan is discharge home, recommend the following:   Assistance with cooking/housework;Assist for transportation;Help with stairs or ramp for entrance     Functional Status Assessment   Patient has not had a recent decline in their functional status     Equipment  Recommendations   None recommended by OT     Recommendations for Other Services         Precautions/Restrictions         Mobility Bed Mobility               General bed mobility comments: NT    Transfers Overall transfer level: Modified independent Equipment used: Rolling walker (2 wheels)               General transfer comment: MOD I from toilet using grab bar and RW; MOD I for mobility      Balance Overall balance assessment: Needs assistance   Sitting balance-Leahy Scale: Good       Standing balance-Leahy Scale: Good Standing balance comment: standing EOB to go to bathroom with NT with no RW able to make it to toilet, but more stability with RW and uses rollator at home                           ADL either performed or assessed with clinical judgement   ADL                                       Functional mobility during ADLs: Supervision/safety;Rolling walker (2 wheels)       Vision         Perception  Praxis         Pertinent Vitals/Pain Pain Assessment Pain Assessment: Faces Faces Pain Scale: No hurt Pain Location: LUE, pt reports it hurts a little if she moves it, but was able to bear weight through RW during session and reach for paper towel, etc with no issues during session Pain Intervention(s): Monitored during session     Extremity/Trunk Assessment Upper Extremity Assessment Upper Extremity Assessment: Defer to OT evaluation   Lower Extremity Assessment Lower Extremity Assessment: Overall WFL for tasks assessed       Communication Communication Communication: No apparent difficulties   Cognition Arousal: Alert Behavior During Therapy: WFL for tasks assessed/performed Cognition: No apparent impairments                               Following commands: Intact       Cueing  General Comments      no c/o pain or dizziness during session   Exercises Other  Exercises Other Exercises: Edu on role of OT in acute setting and pt reports she would like to do cardiopulmonary rehab on DC   Shoulder Instructions      Home Living Family/patient expects to be discharged to:: Private residence Living Arrangements: Other relatives (great granddaughter and her 67 year old son) Available Help at Discharge: Family;Available 24 hours/day (great granddaughter works from home) Type of Home: House Home Access: Stairs to enter Secretary/administrator of Steps: 8 Entrance Stairs-Rails: Can reach both;Left;Right Home Layout: One level     Bathroom Shower/Tub: Producer, television/film/video: Standard (has a comfort height in her bathroom and standard in the other which is where she fell)     Home Equipment: Rollator (4 wheels);Shower seat          Prior Functioning/Environment Prior Level of Function : Independent/Modified Independent             Mobility Comments: report using a rollator for all mobility or pushing a grocery cart in the grocery store; niece or great granddaughter drive her where she needs; reports cardiopulmonary rehab was ordered by Kateri Mc but she never started ADLs Comments: IND    OT Problem List: Pain   OT Treatment/Interventions:        OT Goals(Current goals can be found in the care plan section)   ADL Goals Pt Will Perform Lower Body Bathing: with modified independence;sit to/from stand;sitting/lateral leans Pt Will Perform Lower Body Dressing: with modified independence;sitting/lateral leans;sit to/from stand Pt Will Transfer to Toilet: with modified independence;ambulating;regular height toilet   OT Frequency:       Co-evaluation              AM-PAC OT "6 Clicks" Daily Activity     Outcome Measure Help from another person eating meals?: None Help from another person taking care of personal grooming?: None Help from another person toileting, which includes using toliet, bedpan, or urinal?: None Help  from another person bathing (including washing, rinsing, drying)?: None Help from another person to put on and taking off regular upper body clothing?: None Help from another person to put on and taking off regular lower body clothing?: None 6 Click Score: 24   End of Session Equipment Utilized During Treatment: Rolling walker (2 wheels) Nurse Communication: Mobility status  Activity Tolerance: Patient tolerated treatment well Patient left: in bed;with call bell/phone within reach;with bed alarm set;with family/visitor present  OT Visit Diagnosis: History of falling (Z91.81)  Time: 5638-7564 OT Time Calculation (min): 18 min Charges:  OT General Charges $OT Visit: 1 Visit OT Evaluation $OT Eval Low Complexity: 1 Low  Ether Wolters, OTR/L 06/01/23, 4:21 PM  Enzley Kitchens [facility] [place] 06/01/2023, 4:21 PM

## 2023-06-02 DIAGNOSIS — Z953 Presence of xenogenic heart valve: Secondary | ICD-10-CM | POA: Diagnosis not present

## 2023-06-02 DIAGNOSIS — I10 Essential (primary) hypertension: Secondary | ICD-10-CM | POA: Diagnosis not present

## 2023-06-02 DIAGNOSIS — I480 Paroxysmal atrial fibrillation: Secondary | ICD-10-CM | POA: Diagnosis not present

## 2023-06-02 DIAGNOSIS — R55 Syncope and collapse: Secondary | ICD-10-CM | POA: Diagnosis not present

## 2023-06-02 LAB — CBC
HCT: 28.2 % — ABNORMAL LOW (ref 36.0–46.0)
Hemoglobin: 9.5 g/dL — ABNORMAL LOW (ref 12.0–15.0)
MCH: 29.8 pg (ref 26.0–34.0)
MCHC: 33.7 g/dL (ref 30.0–36.0)
MCV: 88.4 fL (ref 80.0–100.0)
Platelets: 105 10*3/uL — ABNORMAL LOW (ref 150–400)
RBC: 3.19 MIL/uL — ABNORMAL LOW (ref 3.87–5.11)
RDW: 14.9 % (ref 11.5–15.5)
WBC: 4.7 10*3/uL (ref 4.0–10.5)
nRBC: 0 % (ref 0.0–0.2)

## 2023-06-02 LAB — URINE CULTURE

## 2023-06-02 LAB — BASIC METABOLIC PANEL
Anion gap: 7 (ref 5–15)
BUN: 16 mg/dL (ref 8–23)
CO2: 26 mmol/L (ref 22–32)
Calcium: 8.5 mg/dL — ABNORMAL LOW (ref 8.9–10.3)
Chloride: 107 mmol/L (ref 98–111)
Creatinine, Ser: 0.65 mg/dL (ref 0.44–1.00)
GFR, Estimated: >60 mL/min (ref >60)
Glucose, Bld: 96 mg/dL (ref 70–99)
Potassium: 3.8 mmol/L (ref 3.5–5.1)
Sodium: 140 mmol/L (ref 135–145)

## 2023-06-02 LAB — GLUCOSE, CAPILLARY: Glucose-Capillary: 98 mg/dL (ref 70–99)

## 2023-06-02 MED ORDER — ENOXAPARIN SODIUM 40 MG/0.4ML IJ SOSY
40.0000 mg | PREFILLED_SYRINGE | INTRAMUSCULAR | Status: DC
  Administered 2023-06-02: 40 mg via SUBCUTANEOUS
  Filled 2023-06-02: qty 0.4

## 2023-06-02 NOTE — Progress Notes (Signed)
 Mobility Specialist - Progress Note   06/02/23 1140  Mobility  Activity Ambulated independently in hallway  Level of Assistance Modified independent, requires aide device or extra time  Assistive Device Front wheel walker  Distance Ambulated (ft) 480 ft  Activity Response Tolerated well  Mobility visit 1 Mobility  Mobility Specialist Start Time (ACUTE ONLY) 1134  Mobility Specialist Stop Time (ACUTE ONLY) 1141  Mobility Specialist Time Calculation (min) (ACUTE ONLY) 7 min   Pt supine upon entry, utilizing RA. Pt motivated and agreeable to OOB amb within the hallway this date. Pt completed bed mob indep, STS to RW and amb 3 laps around the NS ModI- steady pace. Pt returned to the room, left supine with alarm set and needs within reach.   Zetta Bills Mobility Specialist 06/02/23 11:44 AM

## 2023-06-02 NOTE — TOC CM/SW Note (Signed)
 Transition of Care [facility]) - Inpatient Brief Assessment   Patient Details  Name: [name] MRN: [id number] Date of Birth: 10-30-1935  Transition of [facility]) CM/SW Contact:    Chapman Fitch, RN Phone Number: 06/02/2023, 10:12 AM   Clinical Narrative:    Transition of Care (TOC) Screening Note   Patient Details  Name: [name] Date of Birth: 02-Dec-1935   Transition of Care [facility]) CM/SW Contact:    Chapman Fitch, RN Phone Number: 06/02/2023, 10:12 AM    Transition of Care Department [facility]) has reviewed patient and no TOC needs have been identified at this time.  If new patient transition needs arise, please place a TOC consult.   Transition of Care Asessment: Insurance and Status: Insurance coverage has been reviewed Patient has primary care physician: Yes     Prior/Current Home Services: No current home services Social Drivers of Health Review: SDOH reviewed no interventions necessary Readmission risk has been reviewed: Yes Transition of care needs: no transition of care needs at this time

## 2023-06-02 NOTE — Discharge Summary (Signed)
 Physician Discharge Summary   Patient: [name] MRN: [id number] DOB: 01/01/1936  Admit date:     06/01/2023  Discharge date: 06/02/23  Discharge Physician: Marcelino Duster   PCP: Joaquim Nam, MD   Recommendations at discharge:   PCP follow-up in 1 week. Cardiology follow-up as scheduled  Discharge Diagnoses: Principal Problem:   Syncope Active Problems:   Hyperlipidemia   Essential hypertension   Atrial fibrillation (HCC)   GI bleed   Shoulder pain  Resolved Problems:   * No resolved hospital problems. *  Hospital Course: [name] is a 88 y.o. female with medical history significant of  aortic stenosis s/p TAVR 04/2023, CAD, paroxysmal atrial fibrillation on Eliquis, hypertension, hyperlipidemia, GERD, PVD, OA, gout and depression/anxiety. She presents to [facility] ED after a syncopal episode during micturition at home.  She had no prodrome while urinating and woke up on the floor. (+) Head strike and patient does take Eliquis.  Patient denies chest pain, dyspnea, palpitations, abdominal pain, nausea, vomiting, diarrhea, dizziness, fevers or chills. Of note patient endorses having "passed out" multiple time in the past, she specifically discusses an episode ~2 years ago where her family was talking to her and she was staring off into space and not responding, she blacked out after.  During hospital stay patient's telemetry uneventful, troponin negative, CT head unremarkable, she is neurologically intact.  Patient got IV hydration.  She is seen by cardiology who recommended outpatient echocardiogram and Holter monitor.  Neurology evaluated the patient, performed EEG which is unremarkable.  Patient has prior history of aortic stenosis status post TAVR I advised her to follow-up with cardiology upon discharge as instructed and she understands and agrees with the discharge plan.        Consultants: Cardiology, Neurology. Procedures performed: none   Disposition: Home Diet recommendation:  Discharge Diet Orders (From admission, onward)     Start     Ordered   06/02/23 0000  Diet - low sodium heart healthy        06/02/23 1229           Cardiac diet DISCHARGE MEDICATION: Allergies as of 06/02/2023       Reactions   Amoxicillin Swelling   Face hands   Penicillins Swelling, Other (See Comments)   Has patient had a PCN reaction causing immediate rash, facial/tongue/throat swelling, SOB or lightheadedness with hypotension: Yes Has patient had a PCN reaction causing severe rash involving mucus membranes or skin necrosis: No Has patient had a PCN reaction that required hospitalization No phalosporin use.   Valium [diazepam] Other (See Comments)   Tremor.  Tolerates alprazolam.   Ibuprofen Nausea Only, Swelling   Imdur [isosorbide Nitrate]    Headache at 60mg  dose        Medication List     STOP taking these medications    ACID REDUCER PO   ALPRAZolam 0.5 MG tablet Commonly known as: XANAX   fluticasone 50 MCG/ACT nasal spray Commonly known as: FLONASE       TAKE these medications    apixaban 2.5 MG Tabs tablet Commonly known as: Eliquis Take 1 tablet (2.5 mg total) by mouth 2 (two) times daily.   atorvastatin 80 MG tablet Commonly known as: LIPITOR Take 1 tablet (80 mg total) by mouth every evening.   colchicine 0.6 MG tablet Take 0.5 tablets (0.3 mg total) by mouth daily as needed (for gout).   cyanocobalamin 1000 MCG tablet Commonly known as: VITAMIN B12 Take 1,000  mcg by mouth at bedtime.   diltiazem 180 MG 24 hr capsule Commonly known as: CARDIZEM CD Take 1 capsule (180 mg total) by mouth daily.   gabapentin 100 MG capsule Commonly known as: Neurontin Take 1 capsule (100 mg total) by mouth 3 (three) times daily.   isosorbide mononitrate 30 MG 24 hr tablet Commonly known as: IMDUR Take 1 tablet (30 mg total) by mouth daily.   lisinopril 5 MG tablet Commonly known as: ZESTRIL Take 1  tablet (5 mg total) by mouth daily.   omeprazole 20 MG capsule Commonly known as: PRILOSEC Take 1 capsule (20 mg total) by mouth daily.   sertraline 100 MG tablet Commonly known as: ZOLOFT TAKE 1 AND 1/2 TABLETS BY MOUTH ONCE DAILY   Vitamin D 50 MCG (2000 UT) Caps Take 2,000 Units by mouth every morning.        Follow-up Information     Alwyn Pea, MD Follow up.   Specialties: Cardiology, Internal Medicine Why: Echo scheduled for 06/13/23.  Follow up with Dr. Juliann Pares scheduled 06/19/23. Contact information: [address] [place] [address] [phone]                Discharge Exam: Ceasar Mons Weights   05/31/23 1919 06/01/23 0739 06/02/23 0413  Weight: 78.9 kg 79.3 kg 79.1 kg      06/02/2023   12:35 PM 06/02/2023    8:09 AM 06/02/2023    7:35 AM  Vitals with BMI  Systolic 131 148 578  Diastolic 64 70 54  Pulse 56 55 53   General - Elderly African-American female, no apparent distress HEENT - PERRLA, EOMI, atraumatic head, non tender sinuses. Lung - Clear, rales, rhonchi, wheezes. Heart - S1, S2 heard, no murmurs, rubs, no pedal edema Neuro - Alert, awake and oriented x 3, non focal exam. Skin - Warm and dry.  Condition at discharge: stable  The results of significant diagnostics from this hospitalization (including imaging, microbiology, ancillary and laboratory) are listed below for reference.   Imaging Studies: DG Chest 2 View Result Date: 05/31/2023 CLINICAL DATA:  Syncopal episode at home in the bathroom with subsequent fall between the toilet and tub or, hematoma posterior aspect of the head. Patient takes Eliquis. Low back pain and left shoulder pain noted. EXAM: CHEST - 2 VIEW; LEFT SHOULDER - 2+ VIEW COMPARISON:  No previous left shoulder series. Comparison is made with portable chest 04/11/2023. FINDINGS: Left shoulder, four views: There is osteopenia without evidence of fracture or dislocation. The [facility] and glenohumeral joints are  normally aligned. There is slight spurring at the [facility] joint, with narrowing and osteophytes of the glenohumeral joint and spurring of the greater tuberosity. A bone island incidentally noted in the left humeral head. Soft tissues are unremarkable. PA and lateral chest: The heart upper limit of normal in size. No vascular congestion is seen. The mediastinum is stable with aortic tortuosity and atherosclerosis. A TAVR has been inserted in the interval. The lungs are clear. No pleural effusion is seen. Osteopenia and degenerative change of the spine and shoulders. No thoracic compression fracture is evident. IMPRESSION: 1. No evidence of acute chest or shoulder disease. 2. Osteopenia and degenerative change. 3. Aortic atherosclerosis. 4. Interval TAVR insertion. Electronically Signed   By: Almira Bar M.D.   On: 05/31/2023 20:12   DG Shoulder Left Result Date: 05/31/2023 CLINICAL DATA:  Syncopal episode at home in the bathroom with subsequent fall between the toilet and tub or, hematoma posterior aspect  of the head. Patient takes Eliquis. Low back pain and left shoulder pain noted. EXAM: CHEST - 2 VIEW; LEFT SHOULDER - 2+ VIEW COMPARISON:  No previous left shoulder series. Comparison is made with portable chest 04/11/2023. FINDINGS: Left shoulder, four views: There is osteopenia without evidence of fracture or dislocation. The [facility] and glenohumeral joints are normally aligned. There is slight spurring at the [facility] joint, with narrowing and osteophytes of the glenohumeral joint and spurring of the greater tuberosity. A bone island incidentally noted in the left humeral head. Soft tissues are unremarkable. PA and lateral chest: The heart upper limit of normal in size. No vascular congestion is seen. The mediastinum is stable with aortic tortuosity and atherosclerosis. A TAVR has been inserted in the interval. The lungs are clear. No pleural effusion is seen. Osteopenia and degenerative change of the spine and shoulders.  No thoracic compression fracture is evident. IMPRESSION: 1. No evidence of acute chest or shoulder disease. 2. Osteopenia and degenerative change. 3. Aortic atherosclerosis. 4. Interval TAVR insertion. Electronically Signed   By: Almira Bar M.D.   On: 05/31/2023 20:12   CT Head Wo Contrast Result Date: 05/31/2023 CLINICAL DATA:  Larey Seat in the bathroom. Posterior scalp hematoma. On Eliquis. EXAM: CT HEAD WITHOUT CONTRAST CT CERVICAL SPINE WITHOUT CONTRAST TECHNIQUE: Multidetector CT imaging of the head and cervical spine was performed following the standard protocol without intravenous contrast. Multiplanar CT image reconstructions of the cervical spine were also generated. RADIATION DOSE REDUCTION: This exam was performed according to the departmental dose-optimization program which includes automated exposure control, adjustment of the mA and/or kV according to patient size and/or use of iterative reconstruction technique. COMPARISON:  Cervical spine radiographs 04/14/2022; MRI head 11/03/2021 and CT head 07/29/2020 FINDINGS: CT HEAD FINDINGS Brain: No intracranial hemorrhage, mass effect, or evidence of acute infarct. No hydrocephalus. No extra-axial fluid collection. Age related cerebral atrophy and chronic small vessel ischemic disease. Vascular: No hyperdense vessel. Intracranial arterial calcification. Skull: No fracture or focal lesion.  Left posterior scalp hematoma. Sinuses/Orbits: No acute finding. Other: None. CT CERVICAL SPINE FINDINGS Alignment: No evidence of traumatic malalignment. Skull base and vertebrae: No acute fracture. No primary bone lesion or focal pathologic process. Soft tissues and spinal canal: No prevertebral fluid or swelling. No visible canal hematoma. Disc levels: Mild multilevel spondylosis and facet arthropathy. No severe spinal canal narrowing. Upper chest: No acute abnormality. Other: Carotid calcification. IMPRESSION: 1. No acute intracranial abnormality. 2. No acute  fracture in the cervical spine. Electronically Signed   By: Minerva Fester M.D.   On: 05/31/2023 20:03   CT Cervical Spine Wo Contrast Result Date: 05/31/2023 CLINICAL DATA:  Larey Seat in the bathroom. Posterior scalp hematoma. On Eliquis. EXAM: CT HEAD WITHOUT CONTRAST CT CERVICAL SPINE WITHOUT CONTRAST TECHNIQUE: Multidetector CT imaging of the head and cervical spine was performed following the standard protocol without intravenous contrast. Multiplanar CT image reconstructions of the cervical spine were also generated. RADIATION DOSE REDUCTION: This exam was performed according to the departmental dose-optimization program which includes automated exposure control, adjustment of the mA and/or kV according to patient size and/or use of iterative reconstruction technique. COMPARISON:  Cervical spine radiographs 04/14/2022; MRI head 11/03/2021 and CT head 07/29/2020 FINDINGS: CT HEAD FINDINGS Brain: No intracranial hemorrhage, mass effect, or evidence of acute infarct. No hydrocephalus. No extra-axial fluid collection. Age related cerebral atrophy and chronic small vessel ischemic disease. Vascular: No hyperdense vessel. Intracranial arterial calcification. Skull: No fracture or focal lesion.  Left posterior scalp  hematoma. Sinuses/Orbits: No acute finding. Other: None. CT CERVICAL SPINE FINDINGS Alignment: No evidence of traumatic malalignment. Skull base and vertebrae: No acute fracture. No primary bone lesion or focal pathologic process. Soft tissues and spinal canal: No prevertebral fluid or swelling. No visible canal hematoma. Disc levels: Mild multilevel spondylosis and facet arthropathy. No severe spinal canal narrowing. Upper chest: No acute abnormality. Other: Carotid calcification. IMPRESSION: 1. No acute intracranial abnormality. 2. No acute fracture in the cervical spine. Electronically Signed   By: Minerva Fester M.D.   On: 05/31/2023 20:03    Microbiology: Results for orders placed or performed  during the hospital encounter of 06/01/23  Urine Culture     Status: Abnormal   Collection Time: 06/01/23  1:46 AM   Specimen: Urine, Clean Catch  Result Value Ref Range Status   Specimen Description   Final    URINE, CLEAN CATCH Performed at [facility], [address] [place], [place] [address]    Special Requests   Final    NONE Performed at [facility], [address] [place], [place] [address]    Culture MULTIPLE SPECIES PRESENT, SUGGEST RECOLLECTION (A)  Final   Report Status 06/02/2023 FINAL  Final  Resp panel by RT-PCR (RSV, Flu A&B, Covid) Anterior Nasal Swab     Status: None   Collection Time: 06/01/23  2:20 AM   Specimen: Anterior Nasal Swab  Result Value Ref Range Status   SARS Coronavirus 2 by RT PCR NEGATIVE NEGATIVE Final    Comment: (NOTE) SARS-CoV-2 target nucleic acids are NOT DETECTED.  The SARS-CoV-2 RNA is generally detectable in upper respiratory specimens during the acute phase of infection. The lowest concentration of SARS-CoV-2 viral copies this assay can detect is 138 copies/mL. A negative result does not preclude SARS-Cov-2 infection and should not be used as the sole basis for treatment or other patient management decisions. A negative result may occur with  improper specimen collection/handling, submission of specimen other than nasopharyngeal swab, presence of viral mutation(s) within the areas targeted by this assay, and inadequate number of viral copies(<138 copies/mL). A negative result must be combined with clinical observations, patient history, and epidemiological information. The expected result is Negative.  Fact Sheet for Patients:  BloggerCourse.com  Fact Sheet for Healthcare Providers:  SeriousBroker.it  This test is no t yet approved or cleared by the [place] FDA and  has been authorized for detection and/or diagnosis of SARS-CoV-2 by FDA under an  Emergency Use Authorization (EUA). This EUA will remain  in effect (meaning this test can be used) for the duration of the COVID-19 declaration under Section 564(b)(1) of the Act, 21 U.S.C.section 360bbb-3(b)(1), unless the authorization is terminated  or revoked sooner.       Influenza A by PCR NEGATIVE NEGATIVE Final   Influenza B by PCR NEGATIVE NEGATIVE Final    Comment: (NOTE) The Xpert Xpress SARS-CoV-2/FLU/RSV plus assay is intended as an aid in the diagnosis of influenza from Nasopharyngeal swab specimens and should not be used as a sole basis for treatment. Nasal washings and aspirates are unacceptable for Xpert Xpress SARS-CoV-2/FLU/RSV testing.  Fact Sheet for Patients: BloggerCourse.com  Fact Sheet for Healthcare Providers: SeriousBroker.it  This test is not yet approved or cleared by the [place] FDA and has been authorized for detection and/or diagnosis of SARS-CoV-2 by FDA under an Emergency Use Authorization (EUA). This EUA will remain in effect (meaning this test can be used) for the duration of the COVID-19  declaration under Section 564(b)(1) of the Act, 21 U.S.C. section 360bbb-3(b)(1), unless the authorization is terminated or revoked.     Resp Syncytial Virus by PCR NEGATIVE NEGATIVE Final    Comment: (NOTE) Fact Sheet for Patients: BloggerCourse.com  Fact Sheet for Healthcare Providers: SeriousBroker.it  This test is not yet approved or cleared by the [place] FDA and has been authorized for detection and/or diagnosis of SARS-CoV-2 by FDA under an Emergency Use Authorization (EUA). This EUA will remain in effect (meaning this test can be used) for the duration of the COVID-19 declaration under Section 564(b)(1) of the Act, 21 U.S.C. section 360bbb-3(b)(1), unless the authorization is terminated or revoked.  Performed at [facility], [address] [place], [place] [address]     Labs: CBC: Recent Labs  Lab 05/31/23 1921 06/01/23 0755 06/02/23 0413  WBC 5.7 4.7 4.7  HGB 11.3* 10.7* 9.5*  HCT 35.3* 32.1* 28.2*  MCV 92.4 88.2 88.4  PLT 113* 106* 105*   Basic Metabolic Panel: Recent Labs  Lab 05/31/23 1921 06/01/23 0755 06/02/23 0413  NA 139 137 140  K 4.3 3.9 3.8  CL 104 107 107  CO2 27 24 26   GLUCOSE 90 85 96  BUN 20 17 16   CREATININE 0.80 0.70 0.65  CALCIUM 8.6* 8.1* 8.5*  MG  --  2.0  --    Liver Function Tests: No results for input(s): "AST", "ALT", "ALKPHOS", "BILITOT", "PROT", "ALBUMIN" in the last 168 hours. CBG: Recent Labs  Lab 06/02/23 0517  GLUCAP 98    Discharge time spent: 34 minutes.  Signed: Marcelino Duster, MD [facility] 06/02/2023

## 2023-06-02 NOTE — Plan of Care (Signed)

## 2023-06-05 ENCOUNTER — Telehealth: Payer: Self-pay

## 2023-06-05 DIAGNOSIS — Z952 Presence of prosthetic heart valve: Secondary | ICD-10-CM | POA: Diagnosis not present

## 2023-06-05 DIAGNOSIS — Z7901 Long term (current) use of anticoagulants: Secondary | ICD-10-CM | POA: Diagnosis not present

## 2023-06-05 DIAGNOSIS — R001 Bradycardia, unspecified: Secondary | ICD-10-CM | POA: Diagnosis not present

## 2023-06-05 DIAGNOSIS — Z48812 Encounter for surgical aftercare following surgery on the circulatory system: Secondary | ICD-10-CM | POA: Diagnosis not present

## 2023-06-05 DIAGNOSIS — I480 Paroxysmal atrial fibrillation: Secondary | ICD-10-CM | POA: Diagnosis not present

## 2023-06-05 DIAGNOSIS — I2510 Atherosclerotic heart disease of native coronary artery without angina pectoris: Secondary | ICD-10-CM | POA: Diagnosis not present

## 2023-06-05 DIAGNOSIS — E782 Mixed hyperlipidemia: Secondary | ICD-10-CM | POA: Diagnosis not present

## 2023-06-05 DIAGNOSIS — R55 Syncope and collapse: Secondary | ICD-10-CM | POA: Diagnosis not present

## 2023-06-05 DIAGNOSIS — Z79899 Other long term (current) drug therapy: Secondary | ICD-10-CM | POA: Diagnosis not present

## 2023-06-05 NOTE — Transitions of Care (Post Inpatient/ED Visit) (Signed)
   06/05/2023  Name: [name] MRN: [id number] DOB: [birth year]  Today's TOC FU Call Status: Today's TOC FU Call Status:: Unsuccessful Call (1st Attempt) Unsuccessful Call (1st Attempt) Date: 06/05/23  Attempted to reach the patient regarding the most recent Inpatient/ED visit.  Follow Up Plan: Additional outreach attempts will be made to reach the patient to complete the Transitions of Care (Post Inpatient/ED visit) call.   Deidre Ala, BSN, RN [place]  VBCI - [employer] [facility] RN Care Manager [phone]

## 2023-06-06 ENCOUNTER — Telehealth: Payer: Self-pay

## 2023-06-06 NOTE — Transitions of Care (Post Inpatient/ED Visit) (Signed)
   06/06/2023  Name: [name] MRN: [id number] DOB: 1935/06/03  Today's TOC FU Call Status: Today's TOC FU Call Status:: Unsuccessful Call (2nd Attempt) Unsuccessful Call (1st Attempt) Date: 06/05/23 Unsuccessful Call (2nd Attempt) Date: 06/06/23  Attempted to reach the patient regarding the most recent Inpatient/ED visit.  Follow Up Plan: Additional outreach attempts will be made to reach the patient to complete the Transitions of Care (Post Inpatient/ED visit) call.   Deidre Ala, BSN, RN [place]  VBCI - [employer] [facility] RN Care Manager [phone]

## 2023-06-07 ENCOUNTER — Telehealth: Payer: Self-pay

## 2023-06-07 NOTE — Telephone Encounter (Signed)
 Copied from CRM [id number]. Topic: General - Other >> Jun 07, 2023  9:58 AM Rodman Pickle T wrote: Reason for CRM: patient is calling in regarding her medications she has been taken off of several medications after her heart procedure at [facility] hospital she would like to speak with the nurse regarding this

## 2023-06-07 NOTE — Transitions of Care (Post Inpatient/ED Visit) (Signed)
   06/07/2023  Name: [name] MRN: [id number] DOB: Jan 26, 1936  Today's TOC FU Call Status: Today's TOC FU Call Status:: Unsuccessful Call (3rd Attempt) Unsuccessful Call (1st Attempt) Date: 06/05/23 Unsuccessful Call (2nd Attempt) Date: 06/06/23 Unsuccessful Call (3rd Attempt) Date: 06/07/23  Attempted to reach the patient regarding the most recent Inpatient/ED visit.  Follow Up Plan: No further outreach attempts will be made at this time. We have been unable to contact the patient.  Deidre Ala, BSN, RN [place]  VBCI - [employer] [facility] RN Care Manager [phone]

## 2023-06-09 NOTE — Telephone Encounter (Signed)
 Voicemail not set up.

## 2023-06-09 NOTE — Patient Outreach (Signed)
 Care Coordination   Closure  Visit Note   06/09/2023 Name: [name] MRN: [id number] DOB: 12-11-1935  [name] is a 88 y.o. year old female who sees Joaquim Nam, MD for primary care.  Per chart review patient unable to be contacted for reschedule by Burman Nieves, care guide or by [facility] nurse.  Patient closed to care coordination follow up due to being unable to reach.     Goals Addressed             This Visit's Progress    COMPLETED: Patient Stated: " Managing my health"       Interventions Today    Flowsheet Row Most Recent Value  Chronic Disease   Chronic disease during today's visit Hypertension (HTN)  [status post gastritis]  General Interventions   General Interventions Discussed/Reviewed General Interventions Reviewed, Doctor Visits  [evaluation of current treatment plan for HTN / gastritis and patients adherence to plan as established by provider. Assessed for sign/symptoms of gastritis. Assessed blood pressure readings.]  Doctor Visits Discussed/Reviewed Doctor Visits Reviewed  Annabell Sabal upcoming provider visits and confirmed patient has transportation.]  Education Interventions   Education Provided --  [Advised to notify provider for ongoing gastritis symptoms. Advised to continue to monitor blood pressures and write them down. Confirmed patient has help at home when needed.]  Nutrition Interventions   Nutrition Discussed/Reviewed Nutrition Reviewed  [Assessed appetite since having gastritits]  Pharmacy Interventions   Pharmacy Dicussed/Reviewed Pharmacy Topics Reviewed  [medications dfiscussed and compliance encouraged.]               SDOH assessments and interventions completed:  No     Care Coordination Interventions:  No, not indicated   Follow up plan: No further intervention required.   Encounter Outcome:  Patient Visit Completed  [name] Ina RN, BSN, CCM [facility]  [facility], Population Health Case  Manager Phone: [phone]

## 2023-06-12 ENCOUNTER — Other Ambulatory Visit: Payer: Self-pay | Admitting: Family Medicine

## 2023-06-13 DIAGNOSIS — R55 Syncope and collapse: Secondary | ICD-10-CM | POA: Diagnosis not present

## 2023-06-13 NOTE — Telephone Encounter (Signed)
 Unable to leave vm.

## 2023-06-13 NOTE — Telephone Encounter (Signed)
 LOV 03/03/23  NOV not scheduled  Last refill 02/15/23 #60 w/ 1 refill

## 2023-06-14 NOTE — Telephone Encounter (Signed)
 Unable to leave voicemail. Mailbox not set up

## 2023-06-15 ENCOUNTER — Encounter: Payer: Self-pay | Admitting: Family Medicine

## 2023-06-15 NOTE — Telephone Encounter (Signed)
 Several attempts made to contact patient with no success letter sent

## 2023-06-19 DIAGNOSIS — K219 Gastro-esophageal reflux disease without esophagitis: Secondary | ICD-10-CM | POA: Diagnosis not present

## 2023-06-19 DIAGNOSIS — I259 Chronic ischemic heart disease, unspecified: Secondary | ICD-10-CM | POA: Diagnosis not present

## 2023-06-19 DIAGNOSIS — I2510 Atherosclerotic heart disease of native coronary artery without angina pectoris: Secondary | ICD-10-CM | POA: Diagnosis not present

## 2023-06-19 DIAGNOSIS — I480 Paroxysmal atrial fibrillation: Secondary | ICD-10-CM | POA: Diagnosis not present

## 2023-06-19 DIAGNOSIS — Z952 Presence of prosthetic heart valve: Secondary | ICD-10-CM | POA: Diagnosis not present

## 2023-06-19 DIAGNOSIS — I10 Essential (primary) hypertension: Secondary | ICD-10-CM | POA: Diagnosis not present

## 2023-06-19 DIAGNOSIS — E782 Mixed hyperlipidemia: Secondary | ICD-10-CM | POA: Diagnosis not present

## 2023-06-19 DIAGNOSIS — I350 Nonrheumatic aortic (valve) stenosis: Secondary | ICD-10-CM | POA: Diagnosis not present

## 2023-06-21 ENCOUNTER — Other Ambulatory Visit (HOSPITAL_COMMUNITY): Payer: Self-pay

## 2023-06-21 ENCOUNTER — Other Ambulatory Visit: Payer: Self-pay | Admitting: Family Medicine

## 2023-06-21 DIAGNOSIS — E785 Hyperlipidemia, unspecified: Secondary | ICD-10-CM

## 2023-06-21 DIAGNOSIS — I259 Chronic ischemic heart disease, unspecified: Secondary | ICD-10-CM

## 2023-06-21 DIAGNOSIS — I2510 Atherosclerotic heart disease of native coronary artery without angina pectoris: Secondary | ICD-10-CM

## 2023-06-21 DIAGNOSIS — I70222 Atherosclerosis of native arteries of extremities with rest pain, left leg: Secondary | ICD-10-CM

## 2023-06-21 DIAGNOSIS — I10 Essential (primary) hypertension: Secondary | ICD-10-CM

## 2023-06-21 NOTE — Telephone Encounter (Signed)
 Returned call to patient. Unable to leave a vm

## 2023-06-21 NOTE — Telephone Encounter (Signed)
 Patient called back and verified that she wanted medication to be transferred to the [facility] pharmacy in [place]. Patient given the phone number of [facility] [phone] to get medications transferred to the [facility]. Patient verbalized understanding and will be calling to get medications transferred.

## 2023-06-21 NOTE — Telephone Encounter (Signed)
 Copied from CRM [id number]. Topic: General - Other >> Jun 21, 2023  8:26 AM Elizebeth Brooking wrote: Reason for CRM: Patient called in wanting to speak with a nurse regarding the medications that she needs to take, would like for someone to give her a callback

## 2023-06-21 NOTE — Telephone Encounter (Signed)
 Attempted to reach out to this patient as he has refills left on these medications to see if he is trying to change the pharmacy.

## 2023-06-21 NOTE — Telephone Encounter (Signed)
 Copied from CRM [id number]. Topic: Clinical - Medication Refill >> Jun 21, 2023  8:17 AM Elizebeth Brooking wrote: Most Recent Primary Care Visit:  Provider: Eden Emms  Department: [facility]  Visit Type: ACUTE  Date: 03/14/2023  Medication: gabapentin (NEURONTIN) 100 MG capsule lisinopril (ZESTRIL) 5 MG tablet atorvastatin (LIPITOR) 80 MG tablet  Has the patient contacted their pharmacy? Yes (Agent: If no, request that the patient contact the pharmacy for the refill. If patient does not wish to contact the pharmacy document the reason why and proceed with request.) (Agent: If yes, when and what did the pharmacy advise?)  Is this the correct pharmacy for this prescription? Yes If no, delete pharmacy and type the correct one.  This is the patient's preferred pharmacy:   [facility] [address] [place] - [address] [place] [address] Phone: [phone] Fax: [phone]   Has the prescription been filled recently? No  Is the patient out of the medication? Yes  Has the patient been seen for an appointment in the last year OR does the patient have an upcoming appointment? Yes  Can we respond through MyChart? Yes  Agent: Please be advised that Rx refills may take up to 3 business days. We ask that you follow-up with your pharmacy.

## 2023-06-22 ENCOUNTER — Other Ambulatory Visit (HOSPITAL_COMMUNITY): Payer: Self-pay

## 2023-06-22 ENCOUNTER — Other Ambulatory Visit: Payer: Self-pay

## 2023-06-22 ENCOUNTER — Telehealth: Payer: Self-pay | Admitting: Family Medicine

## 2023-06-22 DIAGNOSIS — I259 Chronic ischemic heart disease, unspecified: Secondary | ICD-10-CM

## 2023-06-22 DIAGNOSIS — I4891 Unspecified atrial fibrillation: Secondary | ICD-10-CM

## 2023-06-22 DIAGNOSIS — I70222 Atherosclerosis of native arteries of extremities with rest pain, left leg: Secondary | ICD-10-CM

## 2023-06-22 DIAGNOSIS — F341 Dysthymic disorder: Secondary | ICD-10-CM

## 2023-06-22 DIAGNOSIS — I2510 Atherosclerotic heart disease of native coronary artery without angina pectoris: Secondary | ICD-10-CM

## 2023-06-22 DIAGNOSIS — E785 Hyperlipidemia, unspecified: Secondary | ICD-10-CM

## 2023-06-22 DIAGNOSIS — I10 Essential (primary) hypertension: Secondary | ICD-10-CM

## 2023-06-22 MED ORDER — LISINOPRIL 5 MG PO TABS: 5.0000 mg | ORAL_TABLET | Freq: Every day | ORAL | 5 refills | Status: DC

## 2023-06-22 MED ORDER — COLCHICINE 0.6 MG PO TABS: 0.3000 mg | ORAL_TABLET | Freq: Every day | ORAL | 2 refills | Status: DC | PRN

## 2023-06-22 MED ORDER — SERTRALINE HCL 100 MG PO TABS
150.0000 mg | ORAL_TABLET | Freq: Every day | ORAL | 5 refills | Status: DC
  Filled 2023-06-22: qty 45, 30d supply, fill #0

## 2023-06-22 MED ORDER — ISOSORBIDE MONONITRATE ER 30 MG PO TB24: 30.0000 mg | ORAL_TABLET | Freq: Every day | ORAL | 5 refills | Status: DC

## 2023-06-22 MED ORDER — ATORVASTATIN CALCIUM 80 MG PO TABS: 80.0000 mg | ORAL_TABLET | Freq: Every evening | ORAL | 11 refills | Status: DC

## 2023-06-22 MED ORDER — APIXABAN 2.5 MG PO TABS: 2.5000 mg | ORAL_TABLET | Freq: Two times a day (BID) | ORAL | 5 refills | Status: DC

## 2023-06-22 MED ORDER — GABAPENTIN 100 MG PO CAPS: 100.0000 mg | ORAL_CAPSULE | Freq: Three times a day (TID) | ORAL | 5 refills | Status: DC

## 2023-06-22 MED ORDER — COLCHICINE 0.6 MG PO TABS
0.3000 mg | ORAL_TABLET | Freq: Every day | ORAL | 2 refills | Status: DC | PRN
  Filled 2023-06-22: qty 30, 60d supply, fill #0

## 2023-06-22 MED ORDER — SERTRALINE HCL 100 MG PO TABS: 150.0000 mg | ORAL_TABLET | Freq: Every day | ORAL | 5 refills | Status: DC

## 2023-06-22 MED ORDER — DILTIAZEM HCL ER COATED BEADS 180 MG PO CP24: 180.0000 mg | ORAL_CAPSULE | Freq: Every day | ORAL | 5 refills | Status: DC

## 2023-06-22 NOTE — Telephone Encounter (Signed)
 Reached out to patient and she advised that [name] would not transfer her rx. Sent the rx to [facility] in [place] per patient request

## 2023-06-22 NOTE — Telephone Encounter (Signed)
 Copied from CRM [id number]. Topic: General - Other >> Jun 21, 2023  8:26 AM Elizebeth Brooking wrote: Reason for CRM: Patient called in wanting to speak with a nurse regarding the medications that she needs to take, would like for someone to give her a callback

## 2023-06-22 NOTE — Telephone Encounter (Signed)
Closing encounter. This is a duplicate encounter.

## 2023-06-22 NOTE — Addendum Note (Signed)
 Addended by: Joaquim Nam on: 06/22/2023 01:54 PM   Modules accepted: Orders

## 2023-06-22 NOTE — Telephone Encounter (Signed)
 I sent lisinopril gabapentin and atorvastatin to [facility].

## 2023-06-27 ENCOUNTER — Other Ambulatory Visit: Payer: Self-pay | Admitting: Family Medicine

## 2023-06-27 ENCOUNTER — Other Ambulatory Visit: Payer: Self-pay

## 2023-06-27 DIAGNOSIS — I2510 Atherosclerotic heart disease of native coronary artery without angina pectoris: Secondary | ICD-10-CM

## 2023-06-27 DIAGNOSIS — E785 Hyperlipidemia, unspecified: Secondary | ICD-10-CM

## 2023-06-27 DIAGNOSIS — I10 Essential (primary) hypertension: Secondary | ICD-10-CM

## 2023-06-27 DIAGNOSIS — I259 Chronic ischemic heart disease, unspecified: Secondary | ICD-10-CM

## 2023-06-27 DIAGNOSIS — I70222 Atherosclerosis of native arteries of extremities with rest pain, left leg: Secondary | ICD-10-CM

## 2023-06-28 ENCOUNTER — Other Ambulatory Visit: Payer: Self-pay

## 2023-06-30 ENCOUNTER — Other Ambulatory Visit: Payer: Self-pay

## 2023-07-03 DIAGNOSIS — H35031 Hypertensive retinopathy, right eye: Secondary | ICD-10-CM | POA: Diagnosis not present

## 2023-07-03 DIAGNOSIS — H353122 Nonexudative age-related macular degeneration, left eye, intermediate dry stage: Secondary | ICD-10-CM | POA: Diagnosis not present

## 2023-07-03 DIAGNOSIS — H43813 Vitreous degeneration, bilateral: Secondary | ICD-10-CM | POA: Diagnosis not present

## 2023-07-03 DIAGNOSIS — H353211 Exudative age-related macular degeneration, right eye, with active choroidal neovascularization: Secondary | ICD-10-CM | POA: Diagnosis not present

## 2023-07-20 ENCOUNTER — Ambulatory Visit: Payer: Self-pay

## 2023-07-20 NOTE — Telephone Encounter (Signed)
 Attempted to reach patient to advise that when she comes to her appointment she needs to bring all her meds or her med list to the visit. Unable to leave a voicemail.

## 2023-07-20 NOTE — Telephone Encounter (Signed)
 Agreed.  Please have her bring all her meds or a med list to the OV.  Thanks.

## 2023-07-20 NOTE — Telephone Encounter (Signed)
 Copied from CRM [id number]. Topic: Clinical - Medical Advice >> Jul 20, 2023 10:08 AM Shereese L wrote: Reason for CRM: patient pressure keeps increasing and she wants ti kno what she can do Patient would like a call back  Chief Complaint: elevated bp 159/80 Symptoms: high bp reading Frequency: since surgery Pertinent Negatives: Patient denies headache, cp, sob, dizziness Disposition: [] ED /[] Urgent Care (no appt availability in office) / [x] Appointment(In office/virtual)/ []  [facility]/ [] Home Care/ [] Refused Recommended Disposition /[] [facility]/ []  Follow-up with PCP Additional Notes: per protocol apt made; care advice given, denies questions; instructed to go to ER if becomes worse.   Reason for Disposition  Systolic BP  >= 160 OR Diastolic >= 100  Answer Assessment - Initial Assessment Questions 1. BLOOD PRESSURE: "What is the blood pressure?" "Did you take at least two measurements 5 minutes apart?"     159/80  2. ONSET: "When did you take your blood pressure?"     In the am before she took medications 3. HOW: "How did you take your blood pressure?" (e.g., automatic home BP monitor, visiting nurse)     Cuff at home 4. HISTORY: "Do you have a history of high blood pressure?"     yes 5. MEDICINES: "Are you taking any medicines for blood pressure?" "Have you missed any doses recently?"     no 6. OTHER SYMPTOMS: "Do you have any symptoms?" (e.g., blurred vision, chest pain, difficulty breathing, headache, weakness)     no 7. PREGNANCY: "Is there any chance you are pregnant?" "When was your last menstrual period?"     na  Protocols used: Blood Pressure - High-A-AH

## 2023-07-21 NOTE — Telephone Encounter (Signed)
 Attempted to reach patent unable to leave a message due to voicemail not being set up

## 2023-07-24 ENCOUNTER — Other Ambulatory Visit (HOSPITAL_COMMUNITY): Payer: Self-pay

## 2023-07-25 ENCOUNTER — Ambulatory Visit (INDEPENDENT_AMBULATORY_CARE_PROVIDER_SITE_OTHER): Admitting: Family Medicine

## 2023-07-25 ENCOUNTER — Encounter: Payer: Self-pay | Admitting: Family Medicine

## 2023-07-25 VITALS — BP 122/58 | HR 59 | Temp 98.5°F | Ht 66.0 in | Wt 172.0 lb

## 2023-07-25 DIAGNOSIS — I350 Nonrheumatic aortic (valve) stenosis: Secondary | ICD-10-CM

## 2023-07-25 DIAGNOSIS — I10 Essential (primary) hypertension: Secondary | ICD-10-CM | POA: Diagnosis not present

## 2023-07-25 DIAGNOSIS — R059 Cough, unspecified: Secondary | ICD-10-CM

## 2023-07-25 MED ORDER — FLUTICASONE PROPIONATE HFA 110 MCG/ACT IN AERO: 2.0000 | INHALATION_SPRAY | Freq: Two times a day (BID) | RESPIRATORY_TRACT | 12 refills | Status: DC

## 2023-07-25 MED ORDER — VITAMIN B-12 1000 MCG PO TABS: 1000.0000 ug | ORAL_TABLET | Freq: Every day | ORAL | Status: AC

## 2023-07-25 NOTE — Progress Notes (Unsigned)
 Previous cardiology notes reviewed with patient.    Conclusion Benign Holter with paroxysmal atrial fibrillation 9% burden Consider anticoagulation if there is no contraindication Recommend rate control Consider EP follow-up and evaluation Electronically signed by Barbie Boon, MD at 07/11/2023 8:42 AM EDT  ===================== transcatheter aortic valve replacement (04/24/2023).  ===================== CAD, s/p PCI, s/p TAVR, doing well, no angina, continue continue plavix , lisinopril , rosuvastatin, Zetia . Beta blocker contraindicated due to bradycardia PAF, continue eliquis  Hypertension, today's BP was 136/68, reasonably controlled, continue lisinoporl Hyperlipidemia, continue atorvastatin  and zetia  for lipid management GERD, continue omeprazole  for reflux type symptoms ===================== Med list updated.    She is having episodic cough, at night.  Some white to clear sputum.  Flovent  helped in the past, not on currently.  SABA historically didn't help as much.    Usually her BP has been controlled. She has felt better since valve replacement.  She isn't lightheaded.  No CP.  Still on anticoagulation for AF.    No syncope since event in 05/2023.    Handles on her rollator were little low so we adjusted the height at the office visit and she said that felt better.  Meds, vitals, and allergies reviewed.   ROS: Per HPI unless specifically indicated in ROS section   Nad Ncat Neck supple, no LA RRR Cta Abdomen soft.  Nontender.   No BLE edema.   Skin well-perfused.

## 2023-07-25 NOTE — Patient Instructions (Signed)
 Don't change your meds for now except for adding back flovent . Please update me as needed.  Take care.  Glad to see you.

## 2023-07-26 NOTE — Assessment & Plan Note (Signed)
 Continue with lisinopril  in the meantime.  Update me as needed.  Blood pressure is reasonable.

## 2023-07-26 NOTE — Assessment & Plan Note (Addendum)
 History of, status post transcatheter aortic valve replacement (04/24/2023).  She clearly feels better in the meantime.  Antibiotics/dental prophylaxis discussed with patient.

## 2023-07-26 NOTE — Assessment & Plan Note (Signed)
 Lungs are clear.  I think it makes sense to add back Flovent  and then update me as needed.  Routine cautions given to patient.  She agrees to plan.  Okay for outpatient follow-up.

## 2023-07-28 ENCOUNTER — Other Ambulatory Visit: Payer: Self-pay | Admitting: Family Medicine

## 2023-07-28 NOTE — Telephone Encounter (Signed)
 Copied from CRM [id number]. Topic: Clinical - Prescription Issue >> Jul 28, 2023 10:45 AM Artemio Larry wrote: Reason for CRM: Patient granddaughter Nella Bame called says the fluticasone  (FLOVENT  HFA) 110 MCG/ACT inhaler is not covered by insurance and would like Dr. Vallarie Gauze to prescribe the QVAR inhaler instead (if he recommends) Please call Nella Bame with an update at [phone]

## 2023-07-30 NOTE — Telephone Encounter (Signed)
 Sent.  Please let me know if she cannot get it filled or if it does not help.  Thanks.

## 2023-07-31 NOTE — Telephone Encounter (Signed)
 Patient returned call. She said that the went to pharmacy yesterday and they did not have the rx ready. I called the pharmacy and they advised that the prescription is ready for pick up today. Patient is aware.

## 2023-07-31 NOTE — Telephone Encounter (Signed)
 Attempted to reach out to patient unable to leave voicemail.

## 2023-08-01 ENCOUNTER — Encounter (INDEPENDENT_AMBULATORY_CARE_PROVIDER_SITE_OTHER): Payer: Self-pay

## 2023-08-21 ENCOUNTER — Telehealth

## 2023-08-21 ENCOUNTER — Ambulatory Visit: Payer: Self-pay

## 2023-08-21 ENCOUNTER — Other Ambulatory Visit: Payer: Self-pay | Admitting: Family Medicine

## 2023-08-21 DIAGNOSIS — J029 Acute pharyngitis, unspecified: Secondary | ICD-10-CM | POA: Diagnosis not present

## 2023-08-21 DIAGNOSIS — Z03818 Encounter for observation for suspected exposure to other biological agents ruled out: Secondary | ICD-10-CM | POA: Diagnosis not present

## 2023-08-21 DIAGNOSIS — J0100 Acute maxillary sinusitis, unspecified: Secondary | ICD-10-CM | POA: Diagnosis not present

## 2023-08-21 DIAGNOSIS — R051 Acute cough: Secondary | ICD-10-CM | POA: Diagnosis not present

## 2023-08-21 DIAGNOSIS — J020 Streptococcal pharyngitis: Secondary | ICD-10-CM | POA: Diagnosis not present

## 2023-08-21 DIAGNOSIS — R8290 Unspecified abnormal findings in urine: Secondary | ICD-10-CM | POA: Diagnosis not present

## 2023-08-21 NOTE — Telephone Encounter (Signed)
 FYI Only or Action Required?: FYI only for provider  Patient was last seen in primary care on 07/25/2023 by Donnie Galea, MD. Called Nurse Triage reporting Cough. Symptoms began several days ago. Interventions attempted: OTC medications: Tylenol . Symptoms are: gradually worsening.  Triage Disposition: See HCP Within 4 Hours (Or PCP Triage)  Patient/caregiver understands and will follow disposition?:        Copied from CRM [id number]. Topic: Clinical - Red Word Triage >> Aug 21, 2023  9:00 AM Crispin Dolphin wrote: Red Word that prompted transfer to Nurse Triage: Weakness, fever, cough, sore throat lasting longer than 3 days. Every bone in her body aching Reason for Disposition  [1] Fever > 101 F (38.3 C) AND [2] age > 60 years  Answer Assessment - Initial Assessment Questions 1. ONSET: "When did the cough begin?"      Wednesday  2. SEVERITY: "How bad is the cough today?"      Frequent cough  3. SPUTUM: "Describe the color of your sputum" (none, dry cough; clear, white, yellow, green)     Yellow green  4. HEMOPTYSIS: "Are you coughing up any blood?" If so ask: "How much?" (flecks, streaks, tablespoons, etc.)     no 5. DIFFICULTY BREATHING: "Are you having difficulty breathing?" If Yes, ask: "How bad is it?" (e.g., mild, moderate, severe)    - MILD: No SOB at rest, mild SOB with walking, speaks normally in sentences, can lie down, no retractions, pulse < 100.    - MODERATE: SOB at rest, SOB with minimal exertion and prefers to sit, cannot lie down flat, speaks in phrases, mild retractions, audible wheezing, pulse 100-120.    - SEVERE: Very SOB at rest, speaks in single words, struggling to breathe, sitting hunched forward, retractions, pulse > 120      No  6. FEVER: "Do you have a fever?" If Yes, ask: "What is your temperature, how was it measured, and when did it start?"     Yes   8. LUNG HISTORY: "Do you have any history of lung disease?"  (e.g., pulmonary embolus, asthma, emphysema)      *No Answer* 10. OTHER SYMPTOMS: "Do you have any other symptoms?" (e.g., runny nose, wheezing, chest pain)       Runny nose, fever, aching, weakness, sore thraot  Protocols used: Cough - Acute Productive-A-AH

## 2023-08-21 NOTE — Telephone Encounter (Signed)
 Sent. Thanks.

## 2023-08-21 NOTE — Telephone Encounter (Signed)
 LOV 07/25/23   NOV not scheduled   Last refill 06/14/23  #60 w/ 1 refill

## 2023-08-21 NOTE — Telephone Encounter (Signed)
 Attempted to reach out to patient unable to leave voicemail. Voicemail not set up.

## 2023-08-21 NOTE — Telephone Encounter (Signed)
 Can she get seen here in clinic today?  Or can she be seen at [facility] today?    Either way, would recommend eval today.  Thanks.

## 2023-08-22 NOTE — Telephone Encounter (Signed)
Attempted to reach patient unable to leave a vm.

## 2023-08-23 NOTE — Telephone Encounter (Signed)
Attempted to call patient-unable to leave vm.

## 2023-08-24 NOTE — Telephone Encounter (Signed)
 I tried contacting this patient several times with no success. But it does look like she went to the [facility] and was seen there. I just wanted you to know.

## 2023-08-24 NOTE — Telephone Encounter (Signed)
 Noted. Thanks.

## 2023-08-31 ENCOUNTER — Other Ambulatory Visit (INDEPENDENT_AMBULATORY_CARE_PROVIDER_SITE_OTHER): Payer: Self-pay | Admitting: Nurse Practitioner

## 2023-08-31 DIAGNOSIS — I739 Peripheral vascular disease, unspecified: Secondary | ICD-10-CM

## 2023-08-31 DIAGNOSIS — Z9889 Other specified postprocedural states: Secondary | ICD-10-CM

## 2023-09-04 ENCOUNTER — Ambulatory Visit (INDEPENDENT_AMBULATORY_CARE_PROVIDER_SITE_OTHER): Payer: 59 | Admitting: Nurse Practitioner

## 2023-09-04 ENCOUNTER — Encounter (INDEPENDENT_AMBULATORY_CARE_PROVIDER_SITE_OTHER): Payer: 59

## 2023-09-04 DIAGNOSIS — R350 Frequency of micturition: Secondary | ICD-10-CM | POA: Diagnosis not present

## 2023-09-04 DIAGNOSIS — Z03818 Encounter for observation for suspected exposure to other biological agents ruled out: Secondary | ICD-10-CM | POA: Diagnosis not present

## 2023-09-04 DIAGNOSIS — J40 Bronchitis, not specified as acute or chronic: Secondary | ICD-10-CM | POA: Diagnosis not present

## 2023-09-04 DIAGNOSIS — R300 Dysuria: Secondary | ICD-10-CM | POA: Diagnosis not present

## 2023-09-04 DIAGNOSIS — J029 Acute pharyngitis, unspecified: Secondary | ICD-10-CM | POA: Diagnosis not present

## 2023-09-04 DIAGNOSIS — R051 Acute cough: Secondary | ICD-10-CM | POA: Diagnosis not present

## 2023-09-18 ENCOUNTER — Other Ambulatory Visit: Payer: Self-pay | Admitting: Family Medicine

## 2023-09-19 NOTE — Telephone Encounter (Signed)
 It says PCP d/c med at OV on 02/16/23, please advise

## 2023-09-20 NOTE — Telephone Encounter (Signed)
 Please check with patient.  I thought she was off this medication.  Thanks.

## 2023-09-21 DIAGNOSIS — H353211 Exudative age-related macular degeneration, right eye, with active choroidal neovascularization: Secondary | ICD-10-CM | POA: Diagnosis not present

## 2023-09-21 DIAGNOSIS — H43813 Vitreous degeneration, bilateral: Secondary | ICD-10-CM | POA: Diagnosis not present

## 2023-09-21 DIAGNOSIS — H35031 Hypertensive retinopathy, right eye: Secondary | ICD-10-CM | POA: Diagnosis not present

## 2023-09-21 DIAGNOSIS — H353122 Nonexudative age-related macular degeneration, left eye, intermediate dry stage: Secondary | ICD-10-CM | POA: Diagnosis not present

## 2023-09-26 DIAGNOSIS — R600 Localized edema: Secondary | ICD-10-CM | POA: Diagnosis not present

## 2023-09-26 DIAGNOSIS — I259 Chronic ischemic heart disease, unspecified: Secondary | ICD-10-CM | POA: Diagnosis not present

## 2023-09-26 DIAGNOSIS — I2510 Atherosclerotic heart disease of native coronary artery without angina pectoris: Secondary | ICD-10-CM | POA: Diagnosis not present

## 2023-09-26 DIAGNOSIS — Z952 Presence of prosthetic heart valve: Secondary | ICD-10-CM | POA: Diagnosis not present

## 2023-09-26 DIAGNOSIS — E782 Mixed hyperlipidemia: Secondary | ICD-10-CM | POA: Diagnosis not present

## 2023-09-26 DIAGNOSIS — R001 Bradycardia, unspecified: Secondary | ICD-10-CM | POA: Diagnosis not present

## 2023-09-26 DIAGNOSIS — J45909 Unspecified asthma, uncomplicated: Secondary | ICD-10-CM | POA: Diagnosis not present

## 2023-09-26 DIAGNOSIS — I10 Essential (primary) hypertension: Secondary | ICD-10-CM | POA: Diagnosis not present

## 2023-09-26 DIAGNOSIS — R079 Chest pain, unspecified: Secondary | ICD-10-CM | POA: Diagnosis not present

## 2023-09-26 DIAGNOSIS — I350 Nonrheumatic aortic (valve) stenosis: Secondary | ICD-10-CM | POA: Diagnosis not present

## 2023-09-26 DIAGNOSIS — I480 Paroxysmal atrial fibrillation: Secondary | ICD-10-CM | POA: Diagnosis not present

## 2023-10-01 ENCOUNTER — Other Ambulatory Visit: Payer: Self-pay | Admitting: Family Medicine

## 2023-10-05 ENCOUNTER — Other Ambulatory Visit: Payer: Self-pay | Admitting: Family Medicine

## 2023-10-06 ENCOUNTER — Ambulatory Visit: Payer: Self-pay

## 2023-10-06 NOTE — Telephone Encounter (Signed)
 Spoke to granddaughter. Advised her about getting it OTC and we can address it it at her OV next week.

## 2023-10-06 NOTE — Telephone Encounter (Signed)
 FYI Only or Action Required?: Action required by provider: states needs acid reflux medication.  Patient was last seen in primary care on 07/25/2023 by Cleatus Arlyss RAMAN, MD.  Called Nurse Triage reporting Abdominal Pain.  Symptoms began several days ago.  Interventions attempted: Nothing.  Symptoms are: unchanged.  Triage Disposition: See Physician Within 24 Hours  Patient/caregiver understands and will follow disposition?: Yes            Copied from CRM #[id number]. Topic: Clinical - Red Word Triage >> Oct 06, 2023 12:30 PM Jayma L wrote: Red Word that prompted transfer to Nurse Triage: patients granddaughter called in [place] called and stated patient is without her medicine called lansocrazole 30mg s, said she's been out for a week and she's having stomach issues and acid reflex as well as she's unable to sleep and over the week its got worse. Reason for Disposition  [1] MILD pain (e.g., does not interfere with normal activities) AND [2] pain comes and goes (cramps) AND [3] present > 48 hours  (Exception: This same abdominal pain is a chronic symptom recurrent or ongoing AND present > 4 weeks.)  Answer Assessment - Initial Assessment Questions 1. LOCATION: Where does it hurt?      Lower abd 2. RADIATION: Does the pain shoot anywhere else? (e.g., chest, back)     no 3. ONSET: When did the pain begin? (e.g., minutes, hours or days ago)      Wednesday 4. SUDDEN: Gradual or sudden onset?     gradual 5. PATTERN Does the pain come and go, or is it constant?     Comes and goes 6. SEVERITY: How bad is the pain?  (e.g., Scale 1-10; mild, moderate, or severe)     States no pain, just discomfort 7. RECURRENT SYMPTOM: Have you ever had this type of stomach pain before? If Yes, ask: When was the last time? and What happened that time?      Yes, bloating and gas 8. CAUSE: What do you think is causing the stomach pain? (e.g., gallstones, recent abdominal surgery)      Acid reflux 9. RELIEVING/AGGRAVATING FACTORS: What makes it better or worse? (e.g., antacids, bending or twisting motion, bowel movement)     no 10. OTHER SYMPTOMS: Do you have any other symptoms? (e.g., back pain, diarrhea, fever, urination pain, vomiting)       no 11. PREGNANCY: Is there any chance you are pregnant? When was your last menstrual period?       na  Protocols used: Abdominal Pain - Female-A-AH

## 2023-10-08 NOTE — Telephone Encounter (Signed)
 Noted. Thanks.

## 2023-10-09 ENCOUNTER — Ambulatory Visit: Payer: Self-pay | Admitting: *Deleted

## 2023-10-09 ENCOUNTER — Ambulatory Visit: Admitting: Internal Medicine

## 2023-10-09 NOTE — Telephone Encounter (Signed)
 Please call patient to see if she can come in Thursday or Friday and see another provider. Thank you

## 2023-10-09 NOTE — Telephone Encounter (Signed)
 Copied from CRM [id number]. Topic: Clinical - Red Word Triage >> Oct 09, 2023  9:24 AM [name] wrote: Red Word that prompted transfer to Nurse Triage: Patient calling to reschedule acute visit for Abdominal pain. She needs and early morning appointment for Transportation. 2:15 pm is too late. Reason for Disposition  Requesting regular office appointment    Rescheduling appt due to transportation issues.  Answer Assessment - Initial Assessment Questions 1. REASON FOR CALL: What is the main reason for your call? or How can I best help you?     Pt is yelling at someone in the background.   I was yelling at my dog. I need reschedule my appt.   I have transportation issues.   I can't make my appt this afternoon at 2:15.   I need a morning appt. Patient Access Specialist unable to change the appt.   Transferred to me.  2. SYMPTOMS : Do you have any symptoms?      It's for my abd pain.    Pt triaged last Friday for this issue so not retriaged. 3. OTHER QUESTIONS: Do you have any other questions?     No  Protocols used: Information Only Call - No Triage-A-AH FYI Only or Action Required?: FYI only for provider.  Patient was last seen in primary care on 07/25/2023 by Cleatus Arlyss RAMAN, MD.  Called Nurse Triage reporting Advice Only (Needs to reschedule).  Symptoms began a week ago. See triage notes.   She spoke with a nurse and was triaged last week.   Due to transportation issues had to reschedule her appt.   Needs 3 days advance notice so wasn't able to make 2:15 appt 10/09/2023 with Dr. Cleatus.    Interventions attempted: Other: See triage notes from last week  Did not triage again..  Symptoms are: stable.Rescheduled her.    Triage Disposition: See PCP Within 2 Weeks  Patient/caregiver understands and will follow disposition?: Yes

## 2023-10-09 NOTE — Telephone Encounter (Signed)
 Lvmtcb

## 2023-10-19 ENCOUNTER — Encounter: Payer: Self-pay | Admitting: Family Medicine

## 2023-10-19 ENCOUNTER — Ambulatory Visit (INDEPENDENT_AMBULATORY_CARE_PROVIDER_SITE_OTHER): Admitting: Family Medicine

## 2023-10-19 VITALS — BP 164/72 | HR 55 | Temp 98.3°F | Ht 66.0 in | Wt 185.0 lb

## 2023-10-19 DIAGNOSIS — R1030 Lower abdominal pain, unspecified: Secondary | ICD-10-CM

## 2023-10-19 LAB — CBC WITH DIFFERENTIAL/PLATELET
Basophils Absolute: 0 K/uL (ref 0.0–0.1)
Basophils Relative: 0.5 % (ref 0.0–3.0)
Eosinophils Absolute: 0 K/uL (ref 0.0–0.7)
Eosinophils Relative: 1.1 % (ref 0.0–5.0)
HCT: 35.8 % — ABNORMAL LOW (ref 36.0–46.0)
Hemoglobin: 11.7 g/dL — ABNORMAL LOW (ref 12.0–15.0)
Lymphocytes Relative: 29.3 % (ref 12.0–46.0)
Lymphs Abs: 1.3 K/uL (ref 0.7–4.0)
MCHC: 32.7 g/dL (ref 30.0–36.0)
MCV: 88.2 fl (ref 78.0–100.0)
Monocytes Absolute: 0.3 K/uL (ref 0.1–1.0)
Monocytes Relative: 6.6 % (ref 3.0–12.0)
Neutro Abs: 2.8 K/uL (ref 1.4–7.7)
Neutrophils Relative %: 62.5 % (ref 43.0–77.0)
Platelets: 124 K/uL — ABNORMAL LOW (ref 150.0–400.0)
RBC: 4.06 Mil/uL (ref 3.87–5.11)
RDW: 14.9 % (ref 11.5–15.5)
WBC: 4.5 K/uL (ref 4.0–10.5)

## 2023-10-19 LAB — COMPREHENSIVE METABOLIC PANEL WITH GFR
ALT: 39 U/L — ABNORMAL HIGH (ref 0–35)
AST: 44 U/L — ABNORMAL HIGH (ref 0–37)
Albumin: 3.8 g/dL (ref 3.5–5.2)
Alkaline Phosphatase: 83 U/L (ref 39–117)
BUN: 15 mg/dL (ref 6–23)
CO2: 30 meq/L (ref 19–32)
Calcium: 8.9 mg/dL (ref 8.4–10.5)
Chloride: 101 meq/L (ref 96–112)
Creatinine, Ser: 0.86 mg/dL (ref 0.40–1.20)
GFR: 61.45 mL/min (ref >60.00)
Glucose, Bld: 88 mg/dL (ref 70–99)
Potassium: 4.9 meq/L (ref 3.5–5.1)
Sodium: 139 meq/L (ref 135–145)
Total Bilirubin: 0.4 mg/dL (ref 0.2–1.2)
Total Protein: 6.9 g/dL (ref 6.0–8.3)

## 2023-10-19 LAB — LIPASE: Lipase: 34 U/L (ref 11.0–59.0)

## 2023-10-19 NOTE — Patient Instructions (Signed)
 Go to the lab on the way out.   If you have mychart we'll likely use that to update you.    Take care.  Glad to see you. Let me see about options when I get your labs back.

## 2023-10-19 NOTE — Assessment & Plan Note (Signed)
 Benign exam w/o rebound.  D/w pt about ddx, would check labs today.  If unremarkable, consider imaging to potentially eval for vascular source.  We could refer to GI if needed.  Doesn't have diarrhea and not likely to be an infectious source.  At this point, still  okay for outpatient f/u.  She agrees with plan.

## 2023-10-19 NOTE — Progress Notes (Signed)
 Abd pain, lower. Longstanding, for years.  Has been leaking stool.  No fevers or nausea.  No black or bloody stools.  She has pain most days but not all of the time.  More pain after eating and it doesn't matter what she eats.  Similar sx with any food. Drinking water  doesn't hurt.   BP had usually been controlled at home.  She doesn't feel a mass.  She doesn't have frank diarrhea.   She is still putting up with L shoulder pain after a fall.  That is some better but not resolved.   Meds, vitals, and allergies reviewed.   ROS: Per HPI unless specifically indicated in ROS section   GEN: nad, alert and oriented HEENT: ncat NECK: supple w/o LA CV: rrr with soft SEM.   PULM: ctab, no inc wob ABD: soft, +bs, minimally ttp w/o rebound, lower L/R/midline EXT: no edema SKIN: no acute rash

## 2023-10-22 ENCOUNTER — Ambulatory Visit: Payer: Self-pay | Admitting: Family Medicine

## 2023-10-22 ENCOUNTER — Other Ambulatory Visit: Payer: Self-pay | Admitting: Family Medicine

## 2023-10-22 DIAGNOSIS — R1030 Lower abdominal pain, unspecified: Secondary | ICD-10-CM

## 2023-10-23 ENCOUNTER — Other Ambulatory Visit: Payer: Self-pay | Admitting: Family Medicine

## 2023-10-23 NOTE — Telephone Encounter (Signed)
 LOV: 10/19/23 NOV: nothing scheduled  Last Refill: ALPRAZolam  (XANAX ) 0.5 MG tablet 08/21/23 60 tablets 1 refill

## 2023-10-24 ENCOUNTER — Telehealth: Payer: Self-pay

## 2023-10-24 NOTE — Telephone Encounter (Signed)
 Copied from CRM [id number]. Topic: Clinical - Lab/Test Results >> Oct 24, 2023 11:01 AM Franky GRADE wrote: Reason for CRM: Patient was returning a call she received from [place], I advised patient of Dr.Duncan's message and attempted to transfer to imaging scheduling. Scheduling agent for imaging will call the patient to schedule as the call dropped.

## 2023-10-24 NOTE — Telephone Encounter (Signed)
 Closing encounter. Patient is aware of the results and knows about scheduling imaging appointment

## 2023-10-24 NOTE — Telephone Encounter (Signed)
 Sent. Thanks.

## 2023-10-25 ENCOUNTER — Ambulatory Visit: Payer: Self-pay

## 2023-10-25 ENCOUNTER — Other Ambulatory Visit: Payer: Self-pay | Admitting: Family Medicine

## 2023-10-25 MED ORDER — OMEPRAZOLE 20 MG PO CPDR: 20.0000 mg | DELAYED_RELEASE_CAPSULE | Freq: Every day | ORAL | 3 refills | Status: DC | PRN

## 2023-10-25 MED ORDER — OMEPRAZOLE 20 MG PO CPDR: 20.0000 mg | DELAYED_RELEASE_CAPSULE | Freq: Every day | ORAL | Status: DC | PRN

## 2023-10-25 MED ORDER — ONDANSETRON HCL 4 MG PO TABS: 4.0000 mg | ORAL_TABLET | Freq: Three times a day (TID) | ORAL | 0 refills | Status: AC | PRN

## 2023-10-25 MED ORDER — ONDANSETRON HCL 4 MG PO TABS: 4.0000 mg | ORAL_TABLET | Freq: Three times a day (TID) | ORAL | Status: DC | PRN

## 2023-10-25 NOTE — Addendum Note (Signed)
 Addended by: CLEATUS ARLYSS RAMAN on: 10/25/2023 04:51 PM   Modules accepted: Orders

## 2023-10-25 NOTE — Telephone Encounter (Signed)
 FYI Only or Action Required?: FYI only for provider.  Patient was last seen in primary care on 10/19/2023 by Cleatus Arlyss RAMAN, MD.  Called Nurse Triage reporting Vomiting.  Symptoms began yesterday.  Interventions attempted: Nothing.  Symptoms are: gradually worsening.  Triage Disposition: Go to ED Now (or PCP Triage)  Patient/caregiver understands and will follow disposition?: No, wishes to speak with PCP  Patient transferred to office to speak with Nurse.     Copied from CRM #[id number]. Topic: Clinical - Red Word Triage >> Oct 25, 2023  1:55 PM [name] wrote: Red Word that prompted transfer to Nurse Triage: Patient is calling because she is throwing up and having severe acid reflux. Reason for Disposition  [1] SEVERE vomiting (e.g., 6 or more times/day) AND [2] present > 8 hours (Exception: Patient sounds well, is drinking liquids, does not sound dehydrated, and vomiting has lasted less than 24 hours.)  Answer Assessment - Initial Assessment Questions 1. VOMITING SEVERITY: How many times have you vomited in the past 24 hours?      All night; states more than 7 times 2. ONSET: When did the vomiting begin?      Last night 3. FLUIDS: What fluids or food have you vomited up today? Have you been able to keep any fluids down?     Attempting to drink water  but is not able to swallow it and then vomites after eating and drinking 4. ABDOMEN PAIN: Are your having any abdomen pain? If Yes : How bad is it and what does it feel like? (e.g., crampy, dull, intermittent, constant)      Yes, every night and day on and off 5. DIARRHEA: Is there any diarrhea? If Yes, ask: How many times today?      yes 6. CONTACTS: Is there anyone else in the family with the same symptoms?      na 7. CAUSE: What do you think is causing your vomiting?     States could be GERD 8. HYDRATION STATUS: Any signs of dehydration? (e.g., dry mouth [not only dry lips], too weak to stand) When did  you last urinate?     yes 9. OTHER SYMPTOMS: Do you have any other symptoms? (e.g., fever, headache, vertigo, vomiting blood or coffee grounds, recent head injury)     denies 10. PREGNANCY: Is there any chance you are pregnant? When was your last menstrual period?       na  Protocols used: Vomiting-A-AH

## 2023-10-25 NOTE — Telephone Encounter (Addendum)
 Noted. Agree with recommendations made. She has declined ER eval, ER precautions have been reviewed with patient.   Would she want anti nausea medicine like zofran ? If so, may send in zofran  4mg  Q8 hours PRN nausea.  Will defer to PCP re acid reflux med change. I don't see any on her med list.

## 2023-10-25 NOTE — Telephone Encounter (Signed)
 I spoke with pt; pt said she has diarrhea on and off most everyday for the past 10 years; 10/24/23 pt started with vomiting. Pt vomited x 7 since last night. Pt has not vomited since early this morning. Pt is trying to drink water .pt has not seen blood in vomitus or diarrhea. In past 24 hrs pt has had diarrhea x 6.  Pt said she already has CT of abd scheduled 10/27/23.pt said she does not have fever. Pt said her mouth does feel dry but when she drinks water  the dry mouth goes away for a while; pt has dizziness on and off and lower abd pain. Pt said she usually has lower abd pain when has diarrhea and no worse than usual. Pt has no pain upon urination; urine appears clear and not dark and pt said she is voiding a good amt. Pt wants different med for acid reflux and pt will talk about that at her appt next wk with Dr Cleatus . Pt refuses to go to UC or ED because she is able to drink but pt said if she gets worse she will go to ED pt  has appt to see Dr Cleatus 10/31/23 at 9:30 to FU on CT of abd. UC & ED precautions reviewed again and pt voiced understanding and pt said she appreciates me talking with her.sending note to Dr Cleatus who is out of office and Dr KANDICE who is in office. Will teams Amy CMA also.

## 2023-10-25 NOTE — Telephone Encounter (Signed)
 Agree with ER rec.  I sent rx for zofran  and prilosec.  Thanks.

## 2023-10-26 NOTE — Telephone Encounter (Signed)
 Lvm asking pt to call back. Plz relay Dr Elfredia message.

## 2023-10-27 ENCOUNTER — Ambulatory Visit
Admission: RE | Admit: 2023-10-27 | Discharge: 2023-10-27 | Disposition: A | Discharge status: Home / Self Care | Source: Ambulatory Visit | Attending: Family Medicine | Admitting: Family Medicine

## 2023-10-27 DIAGNOSIS — K5730 Diverticulosis of large intestine without perforation or abscess without bleeding: Secondary | ICD-10-CM | POA: Diagnosis not present

## 2023-10-27 DIAGNOSIS — K449 Diaphragmatic hernia without obstruction or gangrene: Secondary | ICD-10-CM | POA: Diagnosis not present

## 2023-10-27 DIAGNOSIS — R1030 Lower abdominal pain, unspecified: Secondary | ICD-10-CM

## 2023-10-27 MED ORDER — IOPAMIDOL (ISOVUE-370) INJECTION 76%
100.0000 mL | Freq: Once | INTRAVENOUS | Status: AC | PRN
  Administered 2023-10-27: 100 mL via INTRAVENOUS

## 2023-10-27 NOTE — Telephone Encounter (Signed)
 Left voicemail for patient to return call to office.

## 2023-10-27 NOTE — Telephone Encounter (Signed)
 Unable to reach patient but she is scheduled to be seen on 8/19

## 2023-10-29 DIAGNOSIS — J45991 Cough variant asthma: Secondary | ICD-10-CM | POA: Diagnosis not present

## 2023-10-29 DIAGNOSIS — I70222 Atherosclerosis of native arteries of extremities with rest pain, left leg: Secondary | ICD-10-CM | POA: Diagnosis not present

## 2023-10-30 ENCOUNTER — Ambulatory Visit: Payer: Self-pay | Admitting: Family Medicine

## 2023-10-30 NOTE — Telephone Encounter (Signed)
 Noted, will see tomorrow at OV.  Thanks

## 2023-10-31 ENCOUNTER — Ambulatory Visit (INDEPENDENT_AMBULATORY_CARE_PROVIDER_SITE_OTHER): Admitting: Family Medicine

## 2023-10-31 ENCOUNTER — Encounter: Payer: Self-pay | Admitting: Family Medicine

## 2023-10-31 VITALS — BP 138/80 | HR 73 | Temp 98.6°F | Ht 66.0 in | Wt 179.0 lb

## 2023-10-31 DIAGNOSIS — I774 Celiac artery compression syndrome: Secondary | ICD-10-CM

## 2023-10-31 DIAGNOSIS — M25561 Pain in right knee: Secondary | ICD-10-CM

## 2023-10-31 DIAGNOSIS — R059 Cough, unspecified: Secondary | ICD-10-CM

## 2023-10-31 MED ORDER — PREDNISONE 10 MG PO TABS: 20.0000 mg | ORAL_TABLET | Freq: Every day | ORAL | Status: DC

## 2023-10-31 MED ORDER — OMEPRAZOLE 20 MG PO CPDR: 20.0000 mg | DELAYED_RELEASE_CAPSULE | Freq: Every day | ORAL | 3 refills | Status: DC | PRN

## 2023-10-31 MED ORDER — BENZONATATE 100 MG PO CAPS: 100.0000 mg | ORAL_CAPSULE | Freq: Three times a day (TID) | ORAL | Status: DC | PRN

## 2023-10-31 NOTE — Patient Instructions (Addendum)
 Try taking prilosec for heartburn.   Prednisone  may help the cough and knee pain.  Let me know if you can't get set up with vascular surgery.    [facility] at [facility] [address], [place], [place] [address] [phone]

## 2023-10-31 NOTE — Progress Notes (Unsigned)
 Mild chronic compression fracture of L5. Cholecystolithiasis. No changes of acute cholecystitis.    No aortic aneurysm or aortic dissection. Diffuse aortic and iliac calcified atherosclerosis without hemodynamically significant stenosis. No acute thrombus within the mesenteric vessels. 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 Severe narrowing of the IMA ostium from predominantly calcified plaque.   She was in the yard yesterday and the dog jumped up on her.  She fell.  She wasn't attacked by the dog, the dog was just excited.  No LOC.  She was able to get up on her own.  Was able to walk into the house.  Trouble sleeping last night from knee pain.  Medial R knee pain.  Pain is ongoing but better than yesterday.    GERD ongoing.  Not yet on PPI.  Abd pain with eating, more with big meals.  Some occ vomiting.  She does better with smaller meals, eating slowly.    She had to use inhaler recently, with mult covid exposures at church.  Still with some cough.  No fevers.  Taking prednisone  and tessalon  as needed.    Meds, vitals, and allergies reviewed.   ROS: Per HPI unless specifically indicated in ROS section   R medial knee sore but not bruised.

## 2023-11-01 DIAGNOSIS — I774 Celiac artery compression syndrome: Secondary | ICD-10-CM | POA: Insufficient documentation

## 2023-11-01 DIAGNOSIS — M25561 Pain in right knee: Secondary | ICD-10-CM | POA: Insufficient documentation

## 2023-11-01 NOTE — Assessment & Plan Note (Addendum)
 Lungs are clear.  Okay for outpatient follow-up.  Would continue Qvar at baseline.  Would finish prednisone  prescription.

## 2023-11-01 NOTE — Assessment & Plan Note (Addendum)
 Unclear how much this is causing her/affecting her GI symptoms.  Discussed options.  Continue atorvastatin  and Eliquis  in the meantime.  Anatomy discussed with patient.  Discussed vascular evaluation.  She preferred evaluation in [place].  Referral placed.  Routine cautions given to patient.  She could also have GERD component.  Discussed taking Prilosec in the meantime.

## 2023-11-01 NOTE — Assessment & Plan Note (Addendum)
 Improving, would defer imaging at this point.  She can update me as needed. Would finish prednisone  prescription.  That should help.

## 2023-11-06 ENCOUNTER — Ambulatory Visit: Payer: Self-pay

## 2023-11-06 NOTE — Telephone Encounter (Signed)
  Answer Assessment - Initial Assessment Questions 1. REASON FOR CALL or QUESTION: What is your reason for calling today?  Patient called asking for a medication for her GERD. States her PCP wrote down the name of a medication for her to try but she does not have the paper.   This RN told her that the name is Prilosec, but that a message will be sent to her PCP to confirm and will request one of his staff members to call her.  Protocols used: PCP Call - No Triage-A-AH Copied from CRM [id number]. Topic: Clinical - Red Word Triage >> Nov 06, 2023  2:11 PM Robinson H wrote: [facility] that prompted transfer to Nurse Triage: Possible acid reflux, food coming back up in throat, water  is coming back up as well, pain in chest below breast towards stomach and right side

## 2023-11-06 NOTE — Telephone Encounter (Addendum)
 Would try prilosec. Rx should be at [facility].  Please let her know.   Please find out when she is going to see vascular clinic and let me know.  Thanks.

## 2023-11-06 NOTE — Telephone Encounter (Signed)
 Attempted to call patient, no answer left voicemail to return phone call tomorrow.

## 2023-11-07 NOTE — Telephone Encounter (Signed)
 Left voicemail for patient to return call to office.

## 2023-11-09 NOTE — Telephone Encounter (Signed)
 Unable to reach patient. Left voicemail to return call to our office.   3rd attempt, will await callback from patient.

## 2023-11-10 NOTE — Telephone Encounter (Signed)
 Please send a letter and check with referrals about contacting her re: vascular appointment.  Thanks.

## 2023-11-14 ENCOUNTER — Encounter: Payer: Self-pay | Admitting: Family Medicine

## 2023-11-14 NOTE — Telephone Encounter (Signed)
 Letter mailed to patient.   Referral team- any update on vascular appt?

## 2023-11-16 NOTE — Telephone Encounter (Signed)
 Left voicemail for patient to return call to office.  If patient calls back please advise that she can reach out to [facility] and schedule an appointment at  [facility] [address], [address] [place] [address] [phone]  It appears that they have attempted to call her twice with no success

## 2023-11-16 NOTE — Telephone Encounter (Signed)
 Per Referral notes, as of 10/31/23 they were processing the referral request and should be contacting the patient to schedule an appt.

## 2023-11-16 NOTE — Telephone Encounter (Signed)
 Please update patient and let me know if she can't get the vascular clinic appointment.  Thanks.

## 2023-11-17 NOTE — Telephone Encounter (Signed)
 Left voicemail for patient to return call to office.

## 2023-11-20 NOTE — Telephone Encounter (Signed)
 Left voicemail for patient to return call to office.

## 2023-11-21 ENCOUNTER — Encounter: Payer: Self-pay | Admitting: Family Medicine

## 2023-11-21 NOTE — Telephone Encounter (Signed)
 Several attempts made to reach patient. Sent letter

## 2023-11-23 ENCOUNTER — Other Ambulatory Visit: Payer: Self-pay

## 2023-11-23 DIAGNOSIS — I70222 Atherosclerosis of native arteries of extremities with rest pain, left leg: Secondary | ICD-10-CM

## 2023-12-04 DIAGNOSIS — H353122 Nonexudative age-related macular degeneration, left eye, intermediate dry stage: Secondary | ICD-10-CM | POA: Diagnosis not present

## 2023-12-04 DIAGNOSIS — H04123 Dry eye syndrome of bilateral lacrimal glands: Secondary | ICD-10-CM | POA: Diagnosis not present

## 2023-12-04 DIAGNOSIS — H35031 Hypertensive retinopathy, right eye: Secondary | ICD-10-CM | POA: Diagnosis not present

## 2023-12-04 DIAGNOSIS — H43813 Vitreous degeneration, bilateral: Secondary | ICD-10-CM | POA: Diagnosis not present

## 2023-12-04 DIAGNOSIS — Z961 Presence of intraocular lens: Secondary | ICD-10-CM | POA: Diagnosis not present

## 2023-12-04 DIAGNOSIS — H353211 Exudative age-related macular degeneration, right eye, with active choroidal neovascularization: Secondary | ICD-10-CM | POA: Diagnosis not present
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
## 2023-12-22 ENCOUNTER — Ambulatory Visit (HOSPITAL_COMMUNITY)
Admission: RE | Admit: 2023-12-22 | Discharge: 2023-12-22 | Disposition: A | Discharge status: Home / Self Care | Source: Ambulatory Visit | Attending: Vascular Surgery | Admitting: Vascular Surgery

## 2023-12-22 ENCOUNTER — Ambulatory Visit (INDEPENDENT_AMBULATORY_CARE_PROVIDER_SITE_OTHER): Admitting: Vascular Surgery

## 2023-12-22 ENCOUNTER — Encounter: Payer: Self-pay | Admitting: Vascular Surgery

## 2023-12-22 VITALS — BP 149/77 | HR 52 | Temp 97.4°F | Resp 20 | Ht 66.0 in | Wt 181.5 lb

## 2023-12-22 DIAGNOSIS — I774 Celiac artery compression syndrome: Secondary | ICD-10-CM

## 2023-12-22 DIAGNOSIS — I70222 Atherosclerosis of native arteries of extremities with rest pain, left leg: Secondary | ICD-10-CM | POA: Diagnosis not present

## 2023-12-27 ENCOUNTER — Emergency Department
Admission: EM | Admit: 2023-12-27 | Discharge: 2023-12-27 | Disposition: A | Discharge status: Home / Self Care | Attending: Emergency Medicine | Admitting: Emergency Medicine

## 2023-12-27 ENCOUNTER — Other Ambulatory Visit: Payer: Self-pay

## 2023-12-27 ENCOUNTER — Ambulatory Visit: Payer: Self-pay

## 2023-12-27 DIAGNOSIS — I2510 Atherosclerotic heart disease of native coronary artery without angina pectoris: Secondary | ICD-10-CM | POA: Insufficient documentation

## 2023-12-27 DIAGNOSIS — R110 Nausea: Secondary | ICD-10-CM | POA: Diagnosis not present

## 2023-12-27 DIAGNOSIS — R197 Diarrhea, unspecified: Secondary | ICD-10-CM | POA: Diagnosis not present

## 2023-12-27 DIAGNOSIS — R109 Unspecified abdominal pain: Secondary | ICD-10-CM | POA: Insufficient documentation

## 2023-12-27 DIAGNOSIS — I10 Essential (primary) hypertension: Secondary | ICD-10-CM | POA: Insufficient documentation

## 2023-12-27 DIAGNOSIS — E860 Dehydration: Secondary | ICD-10-CM | POA: Diagnosis not present

## 2023-12-27 DIAGNOSIS — K219 Gastro-esophageal reflux disease without esophagitis: Secondary | ICD-10-CM | POA: Insufficient documentation

## 2023-12-27 LAB — CBC
HCT: 39.6 % (ref 36.0–46.0)
Hemoglobin: 12.9 g/dL (ref 12.0–15.0)
MCH: 28.9 pg (ref 26.0–34.0)
MCHC: 32.6 g/dL (ref 30.0–36.0)
MCV: 88.8 fL (ref 80.0–100.0)
Platelets: 132 K/uL — ABNORMAL LOW (ref 150–400)
RBC: 4.46 MIL/uL (ref 3.87–5.11)
RDW: 13.8 % (ref 11.5–15.5)
WBC: 6.2 K/uL (ref 4.0–10.5)
nRBC: 0 % (ref 0.0–0.2)

## 2023-12-27 LAB — URINALYSIS, ROUTINE W REFLEX MICROSCOPIC
Bilirubin Urine: NEGATIVE
Glucose, UA: NEGATIVE mg/dL
Hgb urine dipstick: NEGATIVE
Ketones, ur: NEGATIVE mg/dL
Leukocytes,Ua: NEGATIVE
Nitrite: NEGATIVE
Protein, ur: NEGATIVE mg/dL
Specific Gravity, Urine: 1.017 (ref 1.005–1.030)
pH: 5 (ref 5.0–8.0)

## 2023-12-27 LAB — LIPASE, BLOOD: Lipase: 29 U/L (ref 11–51)

## 2023-12-27 LAB — COMPREHENSIVE METABOLIC PANEL WITH GFR
ALT: 43 U/L (ref 0–44)
AST: 50 U/L — ABNORMAL HIGH (ref 15–41)
Albumin: 3.8 g/dL (ref 3.5–5.0)
Alkaline Phosphatase: 91 U/L (ref 38–126)
Anion gap: 10 (ref 5–15)
BUN: 19 mg/dL (ref 8–23)
CO2: 26 mmol/L (ref 22–32)
Calcium: 8.9 mg/dL (ref 8.9–10.3)
Chloride: 102 mmol/L (ref 98–111)
Creatinine, Ser: 0.94 mg/dL (ref 0.44–1.00)
GFR, Estimated: 59 mL/min — ABNORMAL LOW (ref >60)
Glucose, Bld: 95 mg/dL (ref 70–99)
Potassium: 4.2 mmol/L (ref 3.5–5.1)
Sodium: 138 mmol/L (ref 135–145)
Total Bilirubin: 0.7 mg/dL (ref 0.0–1.2)
Total Protein: 7.4 g/dL (ref 6.5–8.1)

## 2023-12-27 MED ORDER — ONDANSETRON HCL 4 MG/2ML IJ SOLN
4.0000 mg | Freq: Once | INTRAMUSCULAR | Status: AC
  Administered 2023-12-27: 4 mg via INTRAVENOUS
  Filled 2023-12-27: qty 2

## 2023-12-27 MED ORDER — SODIUM CHLORIDE 0.9 % IV BOLUS
1000.0000 mL | Freq: Once | INTRAVENOUS | Status: AC
  Administered 2023-12-27: 1000 mL via INTRAVENOUS

## 2023-12-27 NOTE — Telephone Encounter (Signed)
 Noted. Thanks.

## 2023-12-27 NOTE — Discharge Instructions (Addendum)
 Your workup today in the emergency department has shown reassuring results.  Please follow-up with your doctor for ongoing treatment.  Return to the emergency department for any symptom concerning to yourself.

## 2023-12-27 NOTE — ED Provider Notes (Signed)
 [facility] Provider Note    Event Date/Time   First MD Initiated Contact with Patient 12/27/23 1846     (approximate)  History   Chief Complaint: Abdominal Pain  HPI  AUNICA DAUPHINEE is a 88 y.o. female with a past medical history of CAD, gastric reflux, hypertension, presents to the emergency department for diarrhea and abdominal discomfort.  According to the patient for the last 3 to 4 years since she had a colonoscopy she states she has been experiencing abdominal discomfort and intermittent diarrhea.  Patient states she estimates she has 4 or 5 episodes of diarrhea daily.  She has been following up with her PCP and they have referred her to a GI specialist at [facility] clinic but she has not yet seen this person.  Patient states today the diarrhea was more significant and she was feeling weak as if she could be dehydrated she was also nauseated so she came to the emergency department for evaluation.  Patient denies any worsening abdominal pain states mild abdominal cramping but this is largely unchanged x 4 years per patient.  Denies any urinary symptoms.  No fever.  Physical Exam   Triage Vital Signs: ED Triage Vitals  Encounter Vitals Group     BP 12/27/23 1638 (!) 148/81     Girls Systolic BP Percentile --      Girls Diastolic BP Percentile --      Boys Systolic BP Percentile --      Boys Diastolic BP Percentile --      Pulse Rate 12/27/23 1638 84     Resp 12/27/23 1638 18     Temp 12/27/23 1638 98.2 F (36.8 C)     Temp Source 12/27/23 1638 Oral     SpO2 12/27/23 1638 97 %     Weight --      Height --      Head Circumference --      Peak Flow --      Pain Score 12/27/23 1639 (S) 9     Pain Loc --      Pain Education --      Exclude from Growth Chart --     Most recent vital signs: Vitals:   12/27/23 1638  BP: (!) 148/81  Pulse: 84  Resp: 18  Temp: 98.2 F (36.8 C)  SpO2: 97%    General: Awake, no distress.  CV:  Good peripheral  perfusion.  Regular rate and rhythm  Resp:  Normal effort.  Equal breath sounds bilaterally.  Abd:  No distention.  Soft, nontender.  No rebound or guarding.  Benign abdomen.  ED Results / Procedures / Treatments   MEDICATIONS ORDERED IN ED: Medications - No data to display   IMPRESSION / MDM / ASSESSMENT AND PLAN / ED COURSE  I reviewed the triage vital signs and the nursing notes.  Patient's presentation is most consistent with acute presentation with potential threat to life or bodily function.  Patient presents the emergency department for abdominal discomfort and diarrhea.  Patient states symptoms have been ongoing x 3 to 4 years but today she was feeling more dehydrated and nauseated so she came to the emergency department for evaluation.  Patient has had multiple workups in the past to see a CT scan of the abdomen 2 months ago that was negative.  Patient has had a colonoscopy has seen a GI doctor and follows up with her PCP.  Patient's exam here today is reassuring with a  benign abdomen.  Patient's lab work shows a reassuring CBC with a normal white blood cell count, reassuring chemistry with a anion gap of 10 normal lipase.  Given the patient's symptoms we will treat with Zofran  and IV fluids.  I do not believe repeat CT imaging given her normal labs would be of benefit to the patient.  If the patient is feeling better after fluids and nausea medication I believe she could be discharged home with outpatient follow-up.  I would like to check a urinalysis prior to discharge.  Patient is agreeable to this plan.  Patient's workup is reassuring normal CBC chemistry largely normal LFTs normal lipase normal urinalysis.  Patient is feeling better after fluids and nausea medication.  Will discharge patient home have her follow-up with her doctor.  FINAL CLINICAL IMPRESSION(S) / ED DIAGNOSES   Diarrhea Abdominal pain   Note:  This document was prepared using Dragon voice recognition software  and may include unintentional dictation errors.   Dorothyann Drivers, MD 12/27/23 2152

## 2023-12-27 NOTE — ED Triage Notes (Signed)
 Pt c/o abdominal pain ever since she had her colonoscopy and pt has been having diarrhea.

## 2023-12-27 NOTE — Telephone Encounter (Signed)
 FYI Only or Action Required?: FYI only for provider.  Patient was last seen in primary care on 10/31/2023 by Cleatus Arlyss RAMAN, MD.  Called Nurse Triage reporting Rectal Bleeding, Abdominal Pain, and Diarrhea.  Symptoms began several months ago.  Interventions attempted: Other: Vaseline; saw vascular surgeon on Friday told me everything was fine, nothing wrong with my stomach .  Symptoms are: severe constant abdominal pain, weakness/dizziness (moderate), diarrhea, dark spots in stool with bright red rectal bleeding (few spots on toilet tissue or underwear) intermittent/after diarrhea) gradually worsening.  Triage Disposition: Go to ED Now (Notify PCP)  Patient/caregiver understands and will follow disposition?: Yes        Copied from CRM [id number]. Topic: Clinical - Red Word Triage >> Dec 27, 2023  2:26 PM Dedra B wrote: Red Word that prompted transfer to Nurse Triage: Pt experiencing stomach pain, weakness, dizziness, diarrhea, and rectal bleeding. She had ultrasound last week but it was normal. Warm transfer to NT Reason for Disposition  [1] Constant abdominal pain AND [2] present > 2 hours  Answer Assessment - Initial Assessment Questions 1. APPEARANCE of BLOOD: What color is it? Is it passed separately, on the surface of the stool, or mixed in with the stool?      Black specks/dark spots in stool (light brown stool), water  in toilet turns dark/black. No blood in toilet or stool. She states she will experience bright red blood on toilet tissue and underwear.  2. AMOUNT: How much blood was passed?      Bleeding today, few drops of blood on toilet tissue.  3. FREQUENCY: How many times has blood been passed with the stools?      She states she experiences bright red blood on her toilet tissue if she has been having diarrhea all day.  4. ONSET: When was the blood first seen in the stools? (Days or weeks)      2 months.  5. DIARRHEA: Is there also some diarrhea? If  Yes, ask: How many diarrhea stools in the past 24 hours?      Yes. 5-6 episodes of diarrhea today and 3-4 episodes last night.  6. CONSTIPATION: Do you have constipation? If Yes, ask: How bad is it?     No.  7. RECURRENT SYMPTOMS: Have you had blood in your stools before? If Yes, ask: When was the last time? and What happened that time?      She states she has been having issues since she had her colonoscopy done (over 2 years ago).  8. BLOOD THINNERS: Do you take any blood thinners? (e.g., aspirin , clopidogrel  / Plavix , coumadin , heparin ). Notes: Other strong blood thinners include: Arixtra (fondaparinux), Eliquis  (apixaban ), Pradaxa (dabigatran), and Xarelto  (rivaroxaban ).     Eliquis .  9. OTHER SYMPTOMS: Do you have any other symptoms?  (e.g., abdomen pain, vomiting, dizziness, fever)    Weakness/dizziness (moderate, sometimes feels like she can pass out), all over 8-9/10 constant abdominal pain, hemorrhoid. Denies syncopal episodes.  10. PREGNANCY: Is there any chance you are pregnant? When was your last menstrual period?       N/A.  Protocols used: Rectal Bleeding-A-AH

## 2023-12-28 ENCOUNTER — Ambulatory Visit: Payer: Self-pay

## 2023-12-28 NOTE — Telephone Encounter (Signed)
 FYI Only or Action Required?: FYI only for provider.  Patient was last seen in primary care on 10/31/2023 by Cleatus Arlyss RAMAN, MD.  Called Nurse Triage reporting Dizziness.  Symptoms began yesterday.  Interventions attempted: Nothing.  Symptoms are: unchanged.  Triage Disposition: See Physician Within 24 Hours  Patient/caregiver understands and will follow disposition?:  Copied from CRM #[id number]. Topic: Clinical - Red Word Triage >> Dec 28, 2023  4:53 PM [name] wrote: Red Word that prompted transfer to Nurse Triage: went to er yesterday back home and is still dizzy and weak couldnt find anything wrong with her  , nausea, pain Reason for Disposition  [1] MODERATE dizziness (e.g., interferes with normal activities) AND [2] has NOT been evaluated by doctor (or NP/PA) for this  (Exception: Dizziness caused by heat exposure, sudden standing, or poor fluid intake.)  Answer Assessment - Initial Assessment Questions Scheduled appt 12/29/23. Advised ED/call 911 if symptoms worsen.  1. DESCRIPTION: Describe your dizziness.     Feel like something in body, pulling me down; feels weak; pt reports left leg pain from August. Feels little dizzy right now 2. LIGHTHEADED: Do you feel lightheaded? (e.g., somewhat faint, woozy, weak upon standing)     Denies feeling faint/pass out 3. VERTIGO: Do you feel like either you or the room is spinning or tilting? (i.e., vertigo)     denies 4. SEVERITY: How bad is it?  Do you feel like you are going to faint? Can you stand and walk?     Yes, able to stand and walk with walker 5. ONSET:  When did the dizziness begin?     4 weeks ago 6. AGGRAVATING FACTORS: Does anything make it worse? (e.g., standing, change in head position)     Getting up to stand up, dizziness; comes and goes 7. HEART RATE: Can you tell me your heart rate? How many beats in 15 seconds?  (Note: Not all patients can do this.)       unsure 8. CAUSE: What do you think  is causing the dizziness? (e.g., decreased fluids or food, diarrhea, emotional distress, heat exposure, new medicine, sudden standing, vomiting; unknown)     No; reports was taken off BP meds 9. RECURRENT SYMPTOM: Have you had dizziness before? If Yes, ask: When was the last time? What happened that time?     Yes; had medication for dizziness before, don't have medication 10. OTHER SYMPTOMS: Do you have any other symptoms? (e.g., fever, chest pain, vomiting, diarrhea, bleeding)       Denies blurred vision, headaches, chest pain, difficulty breathing, n/v/d.  Seen in ED yesterday, Reports still have lower abd pain, 6/10, onset 1 year, comes and goes; reports better than yesterday.  Protocols used: Dizziness - Lightheadedness-A-AH

## 2023-12-29 ENCOUNTER — Encounter: Payer: Self-pay | Admitting: Family Medicine

## 2023-12-29 ENCOUNTER — Ambulatory Visit: Admitting: Family Medicine

## 2023-12-29 VITALS — BP 140/80 | HR 68 | Temp 98.4°F | Ht 66.0 in | Wt 179.5 lb

## 2023-12-29 DIAGNOSIS — K529 Noninfective gastroenteritis and colitis, unspecified: Secondary | ICD-10-CM | POA: Diagnosis not present

## 2023-12-29 DIAGNOSIS — R109 Unspecified abdominal pain: Secondary | ICD-10-CM | POA: Diagnosis not present

## 2023-12-29 MED ORDER — DICYCLOMINE HCL 10 MG PO CAPS: 10.0000 mg | ORAL_CAPSULE | Freq: Three times a day (TID) | ORAL | 0 refills | Status: DC

## 2023-12-29 NOTE — Progress Notes (Signed)
 Patient ID: [name], female    DOB: [birth year], 88 y.o.   MRN: [id number]  This visit was conducted in person.  BP (!) 140/80   Pulse 68   Temp 98.4 F (36.9 C) (Temporal)   Ht 5' 6 (1.676 m)   Wt 179 lb 8 oz (81.4 kg)   SpO2 94%   BMI 28.97 kg/m    CC:  Chief Complaint  Patient presents with   Dizziness   Fatigue   Diarrhea    Seen in ER on 12/27/23    Abdominal Pain    Not able to eat alot    Subjective:   HPI: [name] is a 88 y.o. female patient of Dr Elfredia with history of afib, HTN, s/p tavr replacement, CAD presenting on 12/29/2023 for Dizziness, Fatigue, Diarrhea (Seen in ER on 12/27/23/), and Abdominal Pain (Not able to eat alot)  Recent ED visit for similar issues on December 27, 2023 At that visit she reported diarrhea and abdominal discomfort.  4-5 episodes of diarrhea daily.  At that office visit she reported that her diarrhea has been going on since her colonoscopy 3 years ago. Pending referral to GI at [facility]. Feeling weak and dehydrated.    Complete metabolic panel was within the normal range, CBC showed slightly low platelets, no anemia and normal white blood cell count Urinalysis unremarkable. She was given IV fluids and Zofran  4 mg..  After this she started feeling better. She has not yet had a hospital follow-up with her PCP   Reviewed  OV with PCP from  10/19/2023 for similar  Lipase, cbc and cmet neg. Of note she had a CT angio abdomen with and without contrast on October 27, 2023 MPRESSION: VASCULAR  1. No aortic aneurysm or aortic dissection. Diffuse aortic and iliac calcified atherosclerosis without hemodynamically significant stenosis. 2. No acute thrombus within the mesenteric vessels. 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 Severe narrowing of the IMA ostium from predominantly calcified plaque.  NON-VASCULAR  1. No additional,  acute findings within the abdomen or pelvis. 2. Cholecystolithiasis.  No changes of acute cholecystitis. 3. Small hiatal hernia.  Sigmoid diverticulosis.  Dx with celiac artery stenosis and referred to vascular MD..  Reviewed vascular note Dr. Pearline from 12/22/2023  He felt symptoms did not refect mesenteric ischemia.  Recommended GI referral... HAS NOT BEEN DONE.   Today she reports:  she continued to have generalized crampy abdominal pain, most in lower abdomen.  Now  in last week having dribbling of urine. No dysuria.  No fever.  Has daily diarrhea 4-5 times a day, loose.  Occ incontinence.  Eating makes it worse.. eats healthy bland food.  Drinking 2- glasses of water  each day, juice.  Wt Readings from Last 3 Encounters:  12/29/23 179 lb 8 oz (81.4 kg)  12/22/23 181 lb 8 oz (82.3 kg)  10/31/23 179 lb (81.2 kg)     Colonoscopy 2022: Dr. Darene polyps   Relevant past medical, surgical, family and social history reviewed and updated as indicated. Interim medical history since our last visit reviewed. Allergies and medications reviewed and updated. Outpatient Medications Prior to Visit  Medication Sig Dispense Refill   ALPRAZolam  (XANAX ) 0.5 MG tablet TAKE 1 TABLET BY MOUTH TWICE DAILY FOR ANXIETY 60 tablet 2   apixaban  (ELIQUIS ) 2.5 MG TABS tablet Take 1 tablet (2.5 mg total) by mouth 2 (two) times daily.  60 tablet 5   atorvastatin  (LIPITOR ) 80 MG tablet Take 1 tablet (80 mg total) by mouth every evening. 30 tablet 11   beclomethasone (QVAR REDIHALER) 80 MCG/ACT inhaler Inhale 1 puff into the lungs 2 (two) times daily. 1 each 5   benzonatate  (TESSALON  PERLES) 100 MG capsule Take 1 capsule (100 mg total) by mouth 3 (three) times daily as needed for cough.     Cholecalciferol  (VITAMIN D ) 2000 units CAPS Take 2,000 Units by mouth every morning.     cyanocobalamin  (VITAMIN B12) 1000 MCG tablet Take 1 tablet (1,000 mcg total) by mouth at bedtime.     gabapentin  (NEURONTIN ) 100  MG capsule Take 1 capsule (100 mg total) by mouth 3 (three) times daily. 90 capsule 5   lisinopril  (ZESTRIL ) 5 MG tablet Take 1 tablet (5 mg total) by mouth daily. 30 tablet 5   ondansetron  (ZOFRAN ) 4 MG tablet Take 1 tablet (4 mg total) by mouth every 8 (eight) hours as needed for nausea or vomiting. 20 tablet 0   predniSONE  (DELTASONE ) 10 MG tablet Take 2 tablets (20 mg total) by mouth daily with breakfast.     sertraline  (ZOLOFT ) 100 MG tablet TAKE 1 AND 1/2 TABLETS BY MOUTH ONCE DAILY 45 tablet 5   omeprazole  (PRILOSEC) 20 MG capsule Take 1 capsule (20 mg total) by mouth daily as needed (for heartburn). (Patient not taking: Reported on 12/29/2023) 30 capsule 3   No facility-administered medications prior to visit.     Per HPI unless specifically indicated in ROS section below Review of Systems  Constitutional:  Negative for fatigue and fever.  HENT:  Negative for ear pain.   Eyes:  Negative for pain.  Respiratory:  Negative for chest tightness and shortness of breath.   Cardiovascular:  Negative for chest pain, palpitations and leg swelling.  Gastrointestinal:  Negative for abdominal pain.  Genitourinary:  Negative for dysuria.   Objective:  BP (!) 140/80   Pulse 68   Temp 98.4 F (36.9 C) (Temporal)   Ht 5' 6 (1.676 m)   Wt 179 lb 8 oz (81.4 kg)   SpO2 94%   BMI 28.97 kg/m   Wt Readings from Last 3 Encounters:  12/29/23 179 lb 8 oz (81.4 kg)  12/22/23 181 lb 8 oz (82.3 kg)  10/31/23 179 lb (81.2 kg)      Physical Exam Constitutional:      General: She is not in acute distress.    Appearance: Normal appearance. She is well-developed. She is not ill-appearing or toxic-appearing.  HENT:     Head: Normocephalic.     Right Ear: Hearing, tympanic membrane, ear canal and external ear normal. Tympanic membrane is not erythematous, retracted or bulging.     Left Ear: Hearing, tympanic membrane, ear canal and external ear normal. Tympanic membrane is not erythematous, retracted  or bulging.     Nose: No mucosal edema or rhinorrhea.     Right Sinus: No maxillary sinus tenderness or frontal sinus tenderness.     Left Sinus: No maxillary sinus tenderness or frontal sinus tenderness.     Mouth/Throat:     Pharynx: Uvula midline.  Eyes:     General: Lids are normal. Lids are everted, no foreign bodies appreciated.     Conjunctiva/sclera: Conjunctivae normal.     Pupils: Pupils are equal, round, and reactive to light.  Neck:     Thyroid : No thyroid  mass or thyromegaly.     Vascular: No carotid bruit.  Trachea: Trachea normal.  Cardiovascular:     Rate and Rhythm: Normal rate and regular rhythm.     Pulses: Normal pulses.     Heart sounds: Normal heart sounds, S1 normal and S2 normal. No murmur heard.    No friction rub. No gallop.  Pulmonary:     Effort: Pulmonary effort is normal. No tachypnea or respiratory distress.     Breath sounds: Normal breath sounds. No decreased breath sounds, wheezing, rhonchi or rales.  Abdominal:     General: Bowel sounds are normal.     Palpations: Abdomen is soft.     Tenderness: There is generalized abdominal tenderness. There is no right CVA tenderness, left CVA tenderness, guarding or rebound.  Musculoskeletal:     Cervical back: Normal range of motion and neck supple.  Skin:    General: Skin is warm and dry.     Findings: No rash.  Neurological:     Mental Status: She is alert.  Psychiatric:        Mood and Affect: Mood is not anxious or depressed.        Speech: Speech normal.        Behavior: Behavior normal. Behavior is cooperative.        Thought Content: Thought content normal.        Judgment: Judgment normal.       Results for orders placed or performed during the hospital encounter of 12/27/23  Lipase, blood   Collection Time: 12/27/23  4:41 PM  Result Value Ref Range   Lipase 29 11 - 51 U/L  Comprehensive metabolic panel   Collection Time: 12/27/23  4:41 PM  Result Value Ref Range   Sodium 138 135 -  145 mmol/L   Potassium 4.2 3.5 - 5.1 mmol/L   Chloride 102 98 - 111 mmol/L   CO2 26 22 - 32 mmol/L   Glucose, Bld 95 70 - 99 mg/dL   BUN 19 8 - 23 mg/dL   Creatinine, Ser 9.05 0.44 - 1.00 mg/dL   Calcium  8.9 8.9 - 10.3 mg/dL   Total Protein 7.4 6.5 - 8.1 g/dL   Albumin 3.8 3.5 - 5.0 g/dL   AST 50 (H) 15 - 41 U/L   ALT 43 0 - 44 U/L   Alkaline Phosphatase 91 38 - 126 U/L   Total Bilirubin 0.7 0.0 - 1.2 mg/dL   GFR, Estimated 59 (L) >60 mL/min   Anion gap 10 5 - 15  CBC   Collection Time: 12/27/23  4:41 PM  Result Value Ref Range   WBC 6.2 4.0 - 10.5 K/uL   RBC 4.46 3.87 - 5.11 MIL/uL   Hemoglobin 12.9 12.0 - 15.0 g/dL   HCT 60.3 63.9 - 53.9 %   MCV 88.8 80.0 - 100.0 fL   MCH 28.9 26.0 - 34.0 pg   MCHC 32.6 30.0 - 36.0 g/dL   RDW 86.1 88.4 - 84.4 %   Platelets 132 (L) 150 - 400 K/uL   nRBC 0.0 0.0 - 0.2 %  Urinalysis, Routine w reflex microscopic -Urine, Clean Catch   Collection Time: 12/27/23  7:56 PM  Result Value Ref Range   Color, Urine YELLOW (A) YELLOW   APPearance CLEAR (A) CLEAR   Specific Gravity, Urine 1.017 1.005 - 1.030   pH 5.0 5.0 - 8.0   Glucose, UA NEGATIVE NEGATIVE mg/dL   Hgb urine dipstick NEGATIVE NEGATIVE   Bilirubin Urine NEGATIVE NEGATIVE   Ketones, ur NEGATIVE NEGATIVE mg/dL  Protein, ur NEGATIVE NEGATIVE mg/dL   Nitrite NEGATIVE NEGATIVE   Leukocytes,Ua NEGATIVE NEGATIVE    Assessment and Plan  Chronic diarrhea -     Ambulatory referral to Gastroenterology  Abdominal cramping  Other orders -     Dicyclomine HCl; Take 1 capsule (10 mg total) by mouth 3 (three) times daily before meals.  Dispense: 90 capsule; Refill: 0  Chronic symptoms ongoing for years.  After review of recent evaluation, PCP note, vascular note and recent ED visit. Symptoms are most consistent with irritable bowel syndrome diarrhea/cramping predominant. She states each time she eats she has gassy diarrhea and abdominal cramping.  Cramping is temporarily improved with  bowel movements. No sign of infection.  No red flags.  No indication for hospitalization Vascular does not feel that celiac artery stenosis is likely cause of her current symptoms. I recommended returning to see Dr. Valli Shanks for evaluation for this issue.  She has not seen GI yet.  I will place a referral given this is a issue that  GI has not evaluated yet. Will have her take a look at the FODMAP diet but can try Bentyl with meals to see if it helps with cramping. Encouraged patient to keep up with fluids and nutrition.  All calls regarding setting up appointments should go through her granddaughter.   No follow-ups on file.   Greig Ring, MD

## 2023-12-29 NOTE — Telephone Encounter (Signed)
 Noted.  Had OV today.  Thanks.

## 2023-12-29 NOTE — Patient Instructions (Signed)
 Low-FODMAP Eating Plan  FODMAP stands for fermentable oligosaccharides, disaccharides, monosaccharides, and polyols. These are sugars that are hard for some people to digest. A low-FODMAP eating plan may help some people who have irritable bowel syndrome (IBS) and certain other bowel (intestinal) diseases to manage their symptoms. This meal plan can be complicated to follow. Work with a diet and nutrition specialist (dietitian) to make a low-FODMAP eating plan that is right for you. A dietitian can help make sure that you get enough nutrition from this diet. What are tips for following this plan? Reading food labels Check labels for hidden FODMAPs such as: High-fructose syrup. Honey. Agave. Natural fruit flavors. Onion or garlic powder. Choose low-FODMAP foods that contain 3-4 grams of fiber per serving. Check food labels for serving sizes. Eat only one serving at a time to make sure FODMAP levels stay low. Shopping Shop with a list of foods that are recommended on this diet and make a meal plan. Meal planning Follow a low-FODMAP eating plan for up to 6 weeks, or as told by your health care provider or dietitian. To follow the eating plan: Eliminate high-FODMAP foods from your diet completely. Choose only low-FODMAP foods to eat. You will do this for 2-6 weeks. Gradually reintroduce high-FODMAP foods into your diet one at a time. Most people should wait a few days before introducing the next new high-FODMAP food into their meal plan. Your dietitian can recommend how quickly you may reintroduce foods. Keep a daily record of what and how much you eat and drink. Make note of any symptoms that you have after eating. Review your daily record with a dietitian regularly to identify which foods you can eat and which foods you should avoid. General tips Drink enough fluid each day to keep your urine pale yellow. Avoid processed foods. These often have added sugar and may be high in FODMAPs. Avoid  most dairy products, whole grains, and sweeteners. Work with a dietitian to make sure you get enough fiber in your diet. Avoid high FODMAP foods at meals to manage symptoms. Recommended foods Fruits Bananas, oranges, tangerines, lemons, limes, blueberries, raspberries, strawberries, grapes, cantaloupe, honeydew melon, kiwi, papaya, passion fruit, and pineapple. Limited amounts of dried cranberries, banana chips, and shredded coconut. Vegetables Eggplant, zucchini, cucumber, peppers, green beans, bean sprouts, lettuce, arugula, kale, Swiss chard, spinach, collard greens, bok choy, summer squash, potato, and tomato. Limited amounts of corn, carrot, and sweet potato. Green parts of scallions. Grains Gluten-free grains, such as rice, oats, buckwheat, quinoa, corn, polenta, and millet. Gluten-free pasta, bread, or cereal. Rice noodles. Corn tortillas. Meats and other proteins Unseasoned beef, pork, poultry, or fish. Eggs. Helene Loader. Tofu (firm) and tempeh. Limited amounts of nuts and seeds, such as almonds, walnuts, [place] nuts, pecans, peanuts, nut butters, pumpkin seeds, chia seeds, and sunflower seeds. Dairy Lactose-free milk, yogurt, and kefir. Lactose-free cottage cheese and ice cream. Non-dairy milks, such as almond, coconut, hemp, and rice milk. Non-dairy yogurt. Limited amounts of goat cheese, brie, mozzarella, parmesan, swiss, and other hard cheeses. Fats and oils Butter-free spreads. Vegetable oils, such as olive, canola, and sunflower oil. Seasoning and other foods Artificial sweeteners with names that do not end in "ol," such as aspartame, saccharine, and stevia. Maple syrup, white table sugar, raw sugar, brown sugar, and molasses. Mayonnaise, soy sauce, and tamari. Fresh basil, coriander, parsley, rosemary, and thyme. Beverages Water and mineral water. Sugar-sweetened soft drinks. Small amounts of orange juice or cranberry juice. Black and green tea. Most dry wines.  Coffee. The items listed  above may not be a complete list of foods and beverages you can eat. Contact a dietitian for more information. Foods to avoid Fruits Fresh, dried, and juiced forms of apple, pear, watermelon, peach, plum, cherries, apricots, blackberries, boysenberries, figs, nectarines, and mango. Avocado. Vegetables Chicory root, artichoke, asparagus, cabbage, snow peas, Brussels sprouts, broccoli, sugar snap peas, mushrooms, celery, and cauliflower. Onions, garlic, leeks, and the white part of scallions. Grains Wheat, including kamut, durum, and semolina. Barley and bulgur. Couscous. Wheat-based cereals. Wheat noodles, bread, crackers, and pastries. Meats and other proteins Fried or fatty meat. Sausage. Cashews and pistachios. Soybeans, baked beans, black beans, chickpeas, kidney beans, fava beans, navy beans, lentils, black-eyed peas, and split peas. Dairy Milk, yogurt, ice cream, and soft cheese. Cream and sour cream. Milk-based sauces. Custard. Buttermilk. Soy milk. Seasoning and other foods Any sugar-free gum or candy. Foods that contain artificial sweeteners such as sorbitol, mannitol, isomalt, or xylitol. Foods that contain honey, high-fructose corn syrup, or agave. Bouillon, vegetable stock, beef stock, and chicken stock. Garlic and onion powder. Condiments made with onion, such as hummus, chutney, pickles, relish, salad dressing, and salsa. Tomato paste. Beverages Chicory-based drinks. Coffee substitutes. Chamomile tea. Fennel tea. Sweet or fortified wines such as port or sherry. Diet soft drinks made with isomalt, mannitol, maltitol, sorbitol, or xylitol. Apple, pear, and mango juice. Juices with high-fructose corn syrup. The items listed above may not be a complete list of foods and beverages you should avoid. Contact a dietitian for more information. Summary FODMAP stands for fermentable oligosaccharides, disaccharides, monosaccharides, and polyols. These are sugars that are hard for some people to  digest. A low-FODMAP eating plan is a short-term diet that helps to ease symptoms of certain bowel diseases. The eating plan usually lasts up to 6 weeks. After that, high-FODMAP foods are reintroduced gradually and one at a time. This can help you find out which foods may be causing symptoms. A low-FODMAP eating plan can be complicated. It is best to work with a dietitian who has experience with this type of plan. This information is not intended to replace advice given to you by your health care provider. Make sure you discuss any questions you have with your health care provider. Document Revised: 02/12/2023 Document Reviewed: 02/12/2023 Elsevier Patient Education  2025 [employer].

## 2024-01-08 ENCOUNTER — Other Ambulatory Visit: Payer: Self-pay | Admitting: Family Medicine

## 2024-01-08 DIAGNOSIS — F341 Dysthymic disorder: Secondary | ICD-10-CM

## 2024-01-09 NOTE — Telephone Encounter (Signed)
 Sertraline  Last filled:  11/28/23, #45 Last OV:  10/31/23, N/V/D Next OV:  none

## 2024-02-12 ENCOUNTER — Other Ambulatory Visit: Payer: Self-pay | Admitting: Family Medicine

## 2024-02-12 LAB — OPHTHALMOLOGY REPORT-SCANNED

## 2024-02-13 NOTE — Telephone Encounter (Signed)
 Name of Medication: Xanax   Name of Pharmacy: [facility]  Last Fill or Written Date and Quantity:  10/24/23 # 60 2 rf  Last Office Visit and Type: 10/17 Avelina peaches Next Office Visit and Type: AWV with health coach [phone]

## 2024-02-21 ENCOUNTER — Ambulatory Visit (INDEPENDENT_AMBULATORY_CARE_PROVIDER_SITE_OTHER): Admitting: *Deleted

## 2024-02-21 VITALS — Ht 66.0 in | Wt 179.0 lb

## 2024-02-21 DIAGNOSIS — Z0000 Encounter for general adult medical examination without abnormal findings: Secondary | ICD-10-CM

## 2024-02-21 NOTE — Patient Instructions (Signed)
 [name],  Thank you for taking the time for your Medicare Wellness Visit. I appreciate your continued commitment to your health goals. Please review the care plan we discussed, and feel free to reach out if I can assist you further.  Please note that Annual Wellness Visits do not include a physical exam. Some assessments may be limited, especially if the visit was conducted virtually. If needed, we may recommend an in-person follow-up with your provider.  Ongoing Care Seeing your primary care provider every 3 to 6 months helps us  monitor your health and provide consistent, personalized care.   Referrals If a referral was made during today's visit and you haven't received any updates within two weeks, please contact the referred provider directly to check on the status.  Recommended Screenings:  Health Maintenance  Topic Date Due   Zoster (Shingles) Vaccine (1 of 2) Never done   DTaP/Tdap/Td vaccine (2 - Tdap) 09/24/2018   Colon Cancer Screening  08/07/2023   Flu Shot  06/11/2024*   Medicare Annual Wellness Visit  02/20/2025   Pneumococcal Vaccine for age over 85  Completed   Osteoporosis screening with Bone Density Scan  Completed   Meningitis B Vaccine  Aged Out   COVID-19 Vaccine  Discontinued  *Topic was postponed. The date shown is not the original due date.       02/21/2024   10:57 AM  Advanced Directives  Does Patient Have a Medical Advance Directive? No  Would patient like information on creating a medical advance directive? No - Patient declined    Vision: Annual vision screenings are recommended for early detection of glaucoma, cataracts, and diabetic retinopathy. These exams can also reveal signs of chronic conditions such as diabetes and high blood pressure.  Dental: Annual dental screenings help detect early signs of oral cancer, gum disease, and other conditions linked to overall health, including heart disease and diabetes.  Please see the attached  documents for additional preventive care recommendations.     [name] , Thank you for taking time to come for your Medicare Wellness Visit. I appreciate your ongoing commitment to your health goals. Please review the following plan we discussed and let me know if I can assist you in the future.   Screening recommendations/referrals: Colonoscopy:  Mammogram:  Bone Density:  Recommended yearly ophthalmology/optometry visit for glaucoma screening and checkup Recommended yearly dental visit for hygiene and checkup  Vaccinations: Influenza vaccine:  Pneumococcal vaccine:  Tdap vaccine:  Shingles vaccine:        Preventive Care 65 Years and Older, Female Preventive care refers to lifestyle choices and visits with your health care provider that can promote health and wellness. What does preventive care include? A yearly physical exam. This is also called an annual well check. Dental exams once or twice a year. Routine eye exams. Ask your health care provider how often you should have your eyes checked. Personal lifestyle choices, including: Daily care of your teeth and gums. Regular physical activity. Eating a healthy diet. Avoiding tobacco and drug use. Limiting alcohol use. Practicing safe sex. Taking low-dose aspirin  every day. Taking vitamin and mineral supplements as recommended by your health care provider. What happens during an annual well check? The services and screenings done by your health care provider during your annual well check will depend on your age, overall health, lifestyle risk factors, and family history of disease. Counseling  Your health care provider may ask you questions about your: Alcohol use. Tobacco use.  Drug use. Emotional well-being. Home and relationship well-being. Sexual activity. Eating habits. History of falls. Memory and ability to understand (cognition). Work and work astronomer. Reproductive health. Screening  You may have the  following tests or measurements: Height, weight, and BMI. Blood pressure. Lipid and cholesterol levels. These may be checked every 5 years, or more frequently if you are over 55 years old. Skin check. Lung cancer screening. You may have this screening every year starting at age 29 if you have a 30-pack-year history of smoking and currently smoke or have quit within the past 15 years. Fecal occult blood test (FOBT) of the stool. You may have this test every year starting at age 66. Flexible sigmoidoscopy or colonoscopy. You may have a sigmoidoscopy every 5 years or a colonoscopy every 10 years starting at age 31. Hepatitis C blood test. Hepatitis B blood test. Sexually transmitted disease (STD) testing. Diabetes screening. This is done by checking your blood sugar (glucose) after you have not eaten for a while (fasting). You may have this done every 1-3 years. Bone density scan. This is done to screen for osteoporosis. You may have this done starting at age 11. Mammogram. This may be done every 1-2 years. Talk to your health care provider about how often you should have regular mammograms. Talk with your health care provider about your test results, treatment options, and if necessary, the need for more tests. Vaccines  Your health care provider may recommend certain vaccines, such as: Influenza vaccine. This is recommended every year. Tetanus, diphtheria, and acellular pertussis (Tdap, Td) vaccine. You may need a Td booster every 10 years. Zoster vaccine. You may need this after age 100. Pneumococcal 13-valent conjugate (PCV13) vaccine. One dose is recommended after age 88. Pneumococcal polysaccharide (PPSV23) vaccine. One dose is recommended after age 85. Talk to your health care provider about which screenings and vaccines you need and how often you need them. This information is not intended to replace advice given to you by your health care provider. Make sure you discuss any questions you  have with your health care provider. Document Released: 03/27/2015 Document Revised: 11/18/2015 Document Reviewed: 12/30/2014 Elsevier Interactive Patient Education  2017 [employer].  Fall Prevention in the Home Falls can cause injuries. They can happen to people of all ages. There are many things you can do to make your home safe and to help prevent falls. What can I do on the outside of my home? Regularly fix the edges of walkways and driveways and fix any cracks. Remove anything that might make you trip as you walk through a door, such as a raised step or threshold. Trim any bushes or trees on the path to your home. Use bright outdoor lighting. Clear any walking paths of anything that might make someone trip, such as rocks or tools. Regularly check to see if handrails are loose or broken. Make sure that both sides of any steps have handrails. Any raised decks and porches should have guardrails on the edges. Have any leaves, snow, or ice cleared regularly. Use sand or salt on walking paths during winter. Clean up any spills in your garage right away. This includes oil or grease spills. What can I do in the bathroom? Use night lights. Install grab bars by the toilet and in the tub and shower. Do not use towel bars as grab bars. Use non-skid mats or decals in the tub or shower. If you need to sit down in the shower, use a plastic,  non-slip stool. Keep the floor dry. Clean up any water  that spills on the floor as soon as it happens. Remove soap buildup in the tub or shower regularly. Attach bath mats securely with double-sided non-slip rug tape. Do not have throw rugs and other things on the floor that can make you trip. What can I do in the bedroom? Use night lights. Make sure that you have a light by your bed that is easy to reach. Do not use any sheets or blankets that are too big for your bed. They should not hang down onto the floor. Have a firm chair that has side arms. You can  use this for support while you get dressed. Do not have throw rugs and other things on the floor that can make you trip. What can I do in the kitchen? Clean up any spills right away. Avoid walking on wet floors. Keep items that you use a lot in easy-to-reach places. If you need to reach something above you, use a strong step stool that has a grab bar. Keep electrical cords out of the way. Do not use floor polish or wax that makes floors slippery. If you must use wax, use non-skid floor wax. Do not have throw rugs and other things on the floor that can make you trip. What can I do with my stairs? Do not leave any items on the stairs. Make sure that there are handrails on both sides of the stairs and use them. Fix handrails that are broken or loose. Make sure that handrails are as long as the stairways. Check any carpeting to make sure that it is firmly attached to the stairs. Fix any carpet that is loose or worn. Avoid having throw rugs at the top or bottom of the stairs. If you do have throw rugs, attach them to the floor with carpet tape. Make sure that you have a light switch at the top of the stairs and the bottom of the stairs. If you do not have them, ask someone to add them for you. What else can I do to help prevent falls? Wear shoes that: Do not have high heels. Have rubber bottoms. Are comfortable and fit you well. Are closed at the toe. Do not wear sandals. If you use a stepladder: Make sure that it is fully opened. Do not climb a closed stepladder. Make sure that both sides of the stepladder are locked into place. Ask someone to hold it for you, if possible. Clearly mark and make sure that you can see: Any grab bars or handrails. First and last steps. Where the edge of each step is. Use tools that help you move around (mobility aids) if they are needed. These include: Canes. Walkers. Scooters. Crutches. Turn on the lights when you go into a dark area. Replace any light  bulbs as soon as they burn out. Set up your furniture so you have a clear path. Avoid moving your furniture around. If any of your floors are uneven, fix them. If there are any pets around you, be aware of where they are. Review your medicines with your doctor. Some medicines can make you feel dizzy. This can increase your chance of falling. Ask your doctor what other things that you can do to help prevent falls. This information is not intended to replace advice given to you by your health care provider. Make sure you discuss any questions you have with your health care provider. Document Released: 12/25/2008 Document Revised: 08/06/2015 Document  Reviewed: 04/04/2014 Elsevier Interactive Patient Education  2017 [employer].
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
## 2024-02-23 ENCOUNTER — Other Ambulatory Visit: Payer: Self-pay

## 2024-02-23 ENCOUNTER — Ambulatory Visit: Payer: Self-pay

## 2024-02-23 NOTE — Telephone Encounter (Addendum)
 I spoke with [name] who is the one who called the office and she said pt cannot be reached right now. The other family members on DPR are not available now also per [name] to her knowledge. When pt returns home later today [name] will take pt to [facility] walk in or [facility]. [name] will call next wk with update. Sending note to Dr Avelina and Dr Cleatus

## 2024-02-23 NOTE — Telephone Encounter (Signed)
 Please have her get checked at [facility] ASAP re: lesion, if she hasn't already been checked.  Thanks.

## 2024-02-23 NOTE — Telephone Encounter (Signed)
 Left message with granddaughter to have patient call the office.

## 2024-02-23 NOTE — Telephone Encounter (Signed)
 FYI Only or Action Required?: Action required by provider: update on patient condition.  Patient was last seen in primary care on 12/29/2023 by Avelina Greig BRAVO, MD.  Called Nurse Triage reporting Mass/boil.  Symptoms began several days ago.  Interventions attempted: Nothing.  Symptoms are: gradually worsening.  Triage Disposition: See Physician Within 24 Hours  Patient/caregiver understands and will follow disposition?: Yes   Copied from CRM #[id number]. Topic: Clinical - Red Word Triage >> Feb 23, 2024  3:33 PM Shereese L wrote: [facility] that prompted transfer to Nurse Triage: Patient stated that she has a big boil between her rear end towards the bottom and she's in pain Reason for Disposition  [1] Boil > 1/2 inch across (> 12 mm; larger than a marble) AND [2] center is soft or pus colored  Answer Assessment - Initial Assessment Questions 1. APPEARANCE of BOIL: What does the boil look like?      Not visualized 2. LOCATION: Where is the boil located?      At the crack of buttock 3. NUMBER: How many boils are there?      one 4. SIZE: How big is the boil? (e.g., inches, cm; compare to size of a coin or other object)     A little bit bigger than a quarter 5. ONSET: When did the boil start?     A couple of days ago 6. PAIN: Is there any pain? If Yes, ask: How bad is the pain?   (Scale 1-10; or mild, moderate, severe)     Very painful 7. FEVER: Do you have a fever? If Yes, ask: What is it, how was it measured, and when did it start?      denies 8. SOURCE: Have you been around anyone with boils or other Staph infections? Have you ever had boils before?     denies 9. OTHER SYMPTOMS: Do you have any other symptoms? (e.g., shaking chills, weakness, rash elsewhere on body)     Patient did have a rash, but it has resolved  Protocols used: Boil (Skin Abscess)-A-AH

## 2024-02-23 NOTE — Telephone Encounter (Signed)
 I saw patient for a different issue on October 17.  This new issue needs to be addressed ASAP.  She should be scheduled in urgent care.  I did not see that E2C2 schedule an appointment.

## 2024-02-25 NOTE — Telephone Encounter (Signed)
Please get update on patient Monday.  Thanks. 

## 2024-02-26 NOTE — Telephone Encounter (Signed)
 Noted. Thanks.

## 2024-02-26 NOTE — Telephone Encounter (Signed)
 Called patient she states she is feeling better. She would like to know if you can get her seen by GI doctor in [place]. I asked if she had received call fro [facility] but she was not sure if that was the office that couldn't see her until April. I have called the office to make sure she is on the schedule for follow up with them. They don't have anything sooner but she is on the cancellation list. They advised that patient call office and do triage call with nurse to see if something can be sent in for her. I have called patient back and gave information and contact number. She will call them now.   Patient sates that she didn't get seen over the weekend boil on bottom but it popped over the weekend. She states it is still hard and hurts. I have set up for office visit tomorrow to have evaluated.

## 2024-02-27 ENCOUNTER — Ambulatory Visit: Admitting: Family Medicine

## 2024-02-27 ENCOUNTER — Encounter: Payer: Self-pay | Admitting: Family Medicine

## 2024-02-27 VITALS — BP 120/58 | HR 59 | Temp 98.8°F | Ht 66.0 in | Wt 180.5 lb

## 2024-02-27 DIAGNOSIS — L989 Disorder of the skin and subcutaneous tissue, unspecified: Secondary | ICD-10-CM | POA: Diagnosis not present

## 2024-02-27 DIAGNOSIS — R197 Diarrhea, unspecified: Secondary | ICD-10-CM | POA: Diagnosis not present

## 2024-02-27 DIAGNOSIS — Z23 Encounter for immunization: Secondary | ICD-10-CM

## 2024-02-27 MED ORDER — DOXYCYCLINE HYCLATE 100 MG PO TABS: 100.0000 mg | ORAL_TABLET | Freq: Two times a day (BID) | ORAL | 0 refills | Status: DC

## 2024-02-27 NOTE — Patient Instructions (Signed)
 I'll work on the GI appointment.   The skin spot should heal over.  If worse, then start doxycycline  with food.  Take care.  Glad to see you.

## 2024-02-27 NOTE — Progress Notes (Unsigned)
 Lesion located on R side of buttocks x 1 week. Prev opened spontaneously and drained.  Was painful locally.  No fevers.  It clearly improved in the last 24 hours per patient report.    Pt states she has chronic diarrhea.  Cramping, gassy.  No blood in stool.  No help with bentyl .  D/w pt about seeing GI.  Hasn't had GI appointment yet.   She needs GI appointment scheduled soon.   She is eating bland foods at baseline.   Meds, vitals, and allergies reviewed.   ROS: Per HPI unless specifically indicated in ROS section   Nad Ncat Rrr Ctab Abd soft, not ttp Chaperoned exam with small resolving lesion on the R medial buttock w/o fluctuant mass.  Appears separate from the rectum. Benign abdominal exam.

## 2024-02-28 NOTE — Assessment & Plan Note (Signed)
 The skin spot should heal over.  If worse, then start doxycycline  with food.  Routine cautions given to patient.  She agrees to plan.

## 2024-02-28 NOTE — Assessment & Plan Note (Signed)
 I need GI clinic input.  See orders.

## 2024-03-06 ENCOUNTER — Ambulatory Visit: Admitting: Family Medicine

## 2024-03-14 ENCOUNTER — Other Ambulatory Visit: Payer: Self-pay | Admitting: Family Medicine

## 2024-03-14 DIAGNOSIS — I4891 Unspecified atrial fibrillation: Secondary | ICD-10-CM

## 2024-03-18 NOTE — Telephone Encounter (Signed)
 Per our refill history patient should have been out in September

## 2024-03-18 NOTE — Telephone Encounter (Signed)
 Sent.  Please get update on use by patient. Thanks.

## 2024-03-20 ENCOUNTER — Ambulatory Visit: Payer: Self-pay

## 2024-03-20 DIAGNOSIS — I480 Paroxysmal atrial fibrillation: Secondary | ICD-10-CM

## 2024-03-20 DIAGNOSIS — Z952 Presence of prosthetic heart valve: Secondary | ICD-10-CM

## 2024-03-20 NOTE — Telephone Encounter (Signed)
2nd attempt. Left message.

## 2024-03-20 NOTE — Telephone Encounter (Signed)
 If she has black stools, esp off anticoagulation, then agree with ER.  I put in pharmacy referral re: anticoagulation cost/help.  Thank you.

## 2024-03-20 NOTE — Telephone Encounter (Signed)
 3rd attempt. Left message.  Message from China J sent at 03/20/2024  9:59 AM EST  Reason for Triage: The patient has not been able to afford her apixaban  (ELIQUIS ) 2.5 MG TABS tablet. She was wondering if there was a way Dr. Cleatus would be able to find a different blood thinner that her insurance will be able to fully cover. As of now she does not have anything to take.  I did advise her to call her insurance company to ask why she is having to pay out of pocket all of the sudden so she will be sure to get that done today as well.

## 2024-03-20 NOTE — Telephone Encounter (Signed)
 FYI Only or Action Required?: FYI only for provider: ED advised.  Patient was last seen in primary care on 02/27/2024 by Cleatus Arlyss RAMAN, MD.  Called Nurse Triage reporting No chief complaint on file..  Symptoms began several years ago.  Interventions attempted: Nothing.  Symptoms are: gradually worsening.  Triage Disposition: Go to ED Now (Notify PCP)  Patient/caregiver understands and will follow disposition?: Yes  Copied from CRM [id number]. Topic: Clinical - Pink Word Triage >> Mar 20, 2024  9:59 AM China J wrote: [employer] triggered transfer to Nurse Triage. See Triage Message for details. >> Mar 20, 2024  3:41 PM Rea C wrote: Patient has returned call and also wants to ask about an alternative for dicyclomine  10 mg capsules. She stated they are not helping her and stool is black.  >> Mar 20, 2024  9:59 AM China J wrote: Reason for Triage: The patient has not been able to afford her apixaban  (ELIQUIS ) 2.5 MG TABS tablet. She was wondering if there was a way Dr. Cleatus would be able to find a different blood thinner that her insurance will be able to fully cover. As of now she does not have anything to take.  I did advise her to call her insurance company to ask why she is having to pay out of pocket all of the sudden so she will be sure to get that done today as well. Reason for Disposition  [1] SEVERE abdominal pain AND [2] age > 60 years  Answer Assessment - Initial Assessment Questions 1. DIARRHEA SEVERITY: How bad is the diarrhea? How many more stools have you had in the past 24 hours than normal?      Every time I eat  2. ONSET: When did the diarrhea begin?      Lord it been going on a long time, 4 or 5 years, reports she has been to GI specialist and is returning to them 07/07/24  3. STOOL DESCRIPTION:  How loose or watery is the diarrhea? What is the stool color? Is there any blood or mucous in the stool?     Dark brown or black  4. VOMITING: Are you  also vomiting? If Yes, ask: How many times in the past 24 hours?      Yes, maybe 3 or 4 times  5. ABDOMEN PAIN: Are you having any abdomen pain? If Yes, ask: What does it feel like? (e.g., crampy, dull, intermittent, constant)      All around in it   6. ABDOMEN PAIN SEVERITY: If present, ask: How bad is the pain?  (e.g., Scale 1-10; mild, moderate, or severe)     8/10  7. ORAL INTAKE: If vomiting, Have you been able to drink liquids? How much liquids have you had in the past 24 hours?     Yes  11. OTHER SYMPTOMS: Do you have any other symptoms? (e.g., fever, blood in stool)       Weakness  Pt reports she has been taking 10mg  bentyl  since October  Protocols used: [facility]

## 2024-03-20 NOTE — Telephone Encounter (Signed)
 This RN attempted x1 to contact patient, no answer, left voicemail message.

## 2024-03-20 NOTE — Telephone Encounter (Signed)
" ° °  Message from China J sent at 03/20/2024  9:59 AM EST  Reason for Triage: The patient has not been able to afford her apixaban  (ELIQUIS ) 2.5 MG TABS tablet. She was wondering if there was a way Dr. Cleatus would be able to find a different blood thinner that her insurance will be able to fully cover. As of now she does not have anything to take.  I did advise her to call her insurance company to ask why she is having to pay out of pocket all of the sudden so she will be sure to get that done today as well.   "

## 2024-03-20 NOTE — Telephone Encounter (Signed)
 Duplicate message patient has been advised to go to ED

## 2024-03-21 ENCOUNTER — Other Ambulatory Visit: Payer: Self-pay | Admitting: Family Medicine

## 2024-03-21 DIAGNOSIS — E785 Hyperlipidemia, unspecified: Secondary | ICD-10-CM

## 2024-03-21 DIAGNOSIS — I2510 Atherosclerotic heart disease of native coronary artery without angina pectoris: Secondary | ICD-10-CM

## 2024-03-21 DIAGNOSIS — I10 Essential (primary) hypertension: Secondary | ICD-10-CM

## 2024-03-21 DIAGNOSIS — I259 Chronic ischemic heart disease, unspecified: Secondary | ICD-10-CM

## 2024-03-21 DIAGNOSIS — I70222 Atherosclerosis of native arteries of extremities with rest pain, left leg: Secondary | ICD-10-CM

## 2024-03-21 DIAGNOSIS — F341 Dysthymic disorder: Secondary | ICD-10-CM

## 2024-03-21 NOTE — Telephone Encounter (Unsigned)
 Copied from CRM #[id number]. Topic: Clinical - Medication Refill >> Mar 21, 2024 10:58 AM Delon T wrote: Medication: sertraline  (ZOLOFT ) 100 MG tablet omeprazole  (PRILOSEC) 20 MG capsule beclomethasone (QVAR  REDIHALER) 80 MCG/ACT inhaler ALPRAZolam  (XANAX ) 0.5 MG tablet gabapentin  (NEURONTIN ) 100 MG capsule lisinopril  (ZESTRIL ) 5 MG tablet atorvastatin  (LIPITOR ) 80 MG tablet dicyclomine  (BENTYL ) 10 MG capsule   Has the patient contacted their pharmacy? Yes (Agent: If no, request that the patient contact the pharmacy for the refill. If patient does not wish to contact the pharmacy document the reason why and proceed with request.) (Agent: If yes, when and what did the pharmacy advise?)  This is the patient's preferred pharmacy:  [phone]   fax-[phone]  Is this the correct pharmacy for this prescription? Yes If no, delete pharmacy and type the correct one.   Has the prescription been filled recently? Yes  Is the patient out of the medication? Yes  Has the patient been seen for an appointment in the last year OR does the patient have an upcoming appointment? Yes  Can we respond through MyChart? Yes  Agent: Please be advised that Rx refills may take up to 3 business days. We ask that you follow-up with your pharmacy.

## 2024-03-22 MED ORDER — OMEPRAZOLE 20 MG PO CPDR: 20.0000 mg | DELAYED_RELEASE_CAPSULE | Freq: Every day | ORAL | 5 refills | Status: DC | PRN

## 2024-03-22 MED ORDER — LISINOPRIL 5 MG PO TABS: 5.0000 mg | ORAL_TABLET | Freq: Every day | ORAL | 5 refills | Status: AC

## 2024-03-22 MED ORDER — GABAPENTIN 100 MG PO CAPS: 100.0000 mg | ORAL_CAPSULE | Freq: Three times a day (TID) | ORAL | 5 refills | Status: DC

## 2024-03-22 MED ORDER — SERTRALINE HCL 100 MG PO TABS: 150.0000 mg | ORAL_TABLET | Freq: Every day | ORAL | 1 refills | Status: AC

## 2024-03-22 MED ORDER — ATORVASTATIN CALCIUM 80 MG PO TABS: 80.0000 mg | ORAL_TABLET | Freq: Every evening | ORAL | 5 refills | Status: DC

## 2024-03-22 NOTE — Telephone Encounter (Signed)
 Left message to return call to office. Ok to [employer] below.

## 2024-03-24 MED ORDER — DICYCLOMINE HCL 10 MG PO CAPS: 10.0000 mg | ORAL_CAPSULE | Freq: Three times a day (TID) | ORAL | 0 refills | Status: DC

## 2024-03-24 MED ORDER — ALPRAZOLAM 0.5 MG PO TABS: ORAL_TABLET | ORAL | 2 refills | Status: DC

## 2024-03-24 MED ORDER — QVAR REDIHALER 80 MCG/ACT IN AERB: 1.0000 | INHALATION_SPRAY | Freq: Two times a day (BID) | RESPIRATORY_TRACT | 5 refills | Status: AC

## 2024-03-24 NOTE — Telephone Encounter (Signed)
 Please check with patient about black stools.   If she has black stools, esp off anticoagulation, then agree with ER.  I put in pharmacy referral re: anticoagulation cost/help.    Please see when she is going to see the GI clinic.

## 2024-03-25 ENCOUNTER — Other Ambulatory Visit: Payer: Self-pay | Admitting: Family Medicine

## 2024-03-25 NOTE — Telephone Encounter (Signed)
 Message from pharmacy:  MEDICATION SENT TO [facility] NPI #[id number]. PLEASE SEND TO [facility] NPI # [id number]. THANK YOU!

## 2024-03-26 ENCOUNTER — Telehealth: Payer: Self-pay

## 2024-03-26 MED ORDER — ALPRAZOLAM 0.5 MG PO TABS: ORAL_TABLET | ORAL | 2 refills | Status: AC

## 2024-03-26 NOTE — Progress Notes (Signed)
 Complex Care Management Note  Care Guide Note 03/26/2024 Name: MAYSA LYNN MRN: [id number] DOB: 17-Dec-1935  LEISEL PINETTE is a 89 y.o. year old female who sees Cleatus Arlyss RAMAN, MD for primary care. I reached out to Roderick KANDICE Harris by phone today to offer complex care management services.  Ms. Luton was given information about Complex Care Management services today including:   The Complex Care Management services include support from the care team which includes your Nurse Care Manager, Clinical Social Worker, or Pharmacist.  The Complex Care Management team is here to help remove barriers to the health concerns and goals most important to you. Complex Care Management services are voluntary, and the patient may decline or stop services at any time by request to their care team member.   Complex Care Management Consent Status: Patient agreed to services and verbal consent obtained.   Follow up plan:  Telephone appointment with complex care management team member scheduled for:  03/29/24 at 8:30 a.m.   Encounter Outcome:  Patient Scheduled  Dreama [facility]  [facility], [facility] VBCI Assistant Direct Dial: [phone]  Fax: [phone]

## 2024-03-27 NOTE — Telephone Encounter (Signed)
 Lvm at home #, asking pt to call back. Pls relay Dr Elfredia message.

## 2024-03-28 ENCOUNTER — Telehealth: Payer: Self-pay

## 2024-03-28 NOTE — Progress Notes (Signed)
 Complex Care Management Care Guide Note  03/28/2024 Name: [name] MRN: [id number] DOB: 1935-04-03  [name] is a 89 y.o. year old female who is a primary care patient of Cleatus Arlyss RAMAN, MD and is actively engaged with the care management team. I reached out to Roderick KANDICE Harris by phone today to assist with re-scheduling  with the Pharmacist.  Follow up plan: Telephone appointment with complex care management team member scheduled for:  04/03/24 at 9:00 a.m.   Dreama [facility]  [facility], [facility] VBCI Assistant Direct Dial: [phone]  Fax: [phone]

## 2024-03-29 ENCOUNTER — Other Ambulatory Visit

## 2024-03-29 NOTE — Telephone Encounter (Signed)
 Lvm at home #, asking pt to call back. Pls relay Dr Elfredia message.

## 2024-04-01 ENCOUNTER — Other Ambulatory Visit: Payer: Self-pay

## 2024-04-01 DIAGNOSIS — I259 Chronic ischemic heart disease, unspecified: Secondary | ICD-10-CM

## 2024-04-01 DIAGNOSIS — I70222 Atherosclerosis of native arteries of extremities with rest pain, left leg: Secondary | ICD-10-CM

## 2024-04-01 DIAGNOSIS — I2510 Atherosclerotic heart disease of native coronary artery without angina pectoris: Secondary | ICD-10-CM

## 2024-04-01 DIAGNOSIS — I4891 Unspecified atrial fibrillation: Secondary | ICD-10-CM

## 2024-04-01 DIAGNOSIS — E785 Hyperlipidemia, unspecified: Secondary | ICD-10-CM

## 2024-04-01 NOTE — Telephone Encounter (Signed)
 Copied from CRM [id number]. Topic: Clinical - Medication Refill >> Apr 01, 2024 10:08 AM Drema MATSU wrote: Medication: ALPRAZolam  (XANAX ) 0.5 MG tablet sertraline  (ZOLOFT ) 100 MG tablet, omeprazole  (PRILOSEC) 20 MG capsule, apixaban  (ELIQUIS ) 2.5 MG TABS tablet, and gabapentin  (NEURONTIN ) 100 MG capsule  Has the patient contacted their pharmacy? Yes (Agent: If no, request that the patient contact the pharmacy for the refill. If patient does not wish to contact the pharmacy document the reason why and proceed with request.)  (Agent: If yes, when and what did the pharmacy advise?)  This is the patient's preferred pharmacy:  [facility] [place] [place] - [address] [place] [address] Phone: [phone] Fax: [phone]  Is this the correct pharmacy for this prescription? Yes If no, delete pharmacy and type the correct one.   Has the prescription been filled recently? N/A  Is the patient out of the medication? No  Has the patient been seen for an appointment in the last year OR does the patient have an upcoming appointment? Yes  Can we respond through MyChart? Yes  Agent: Please be advised that Rx refills may take up to 3 business days. We ask that you follow-up with your pharmacy.

## 2024-04-01 NOTE — Telephone Encounter (Signed)
 Yes, please. Thanks!

## 2024-04-01 NOTE — Telephone Encounter (Signed)
 Attempted to call patient 3 times. Ok to mail letter?

## 2024-04-01 NOTE — Telephone Encounter (Signed)
 Per chart review all medications have been sent either to local pharmacy or to mail order but only for 30 day supply. Ok to resent as pended?

## 2024-04-01 NOTE — Addendum Note (Signed)
 Addended by: SEBASTIAN DANNA GRADE on: 04/01/2024 01:04 PM   Modules accepted: Orders

## 2024-04-02 NOTE — Progress Notes (Unsigned)
 Cost of Eliquis  - *** DOAC indication?  Prior anticoag tried in the past?  Xarleto appropriate?      04/02/2024 Name: [name] MRN: [id number] DOB: 09/29/1935  Subjective  No chief complaint on file.   Care Team: Primary Care Provider: Cleatus Arlyss RAMAN, MD  Reason for visit: ?  [name] is a 89 y.o. female who presents today for a telephone visit with the pharmacist due to medication access concerns regarding their ***. ?   Medication Access: ?  Reports that all medications are not affordable.  {LZ MAP s/o:31502}  Prescription drug coverage: YES Payor: [employer] MEDICARE / Plan: UNITEDHEALTHCARE DUAL COMPLETE / Product Type: *No Product type* / .  Summary of Benefit: ***  Current Patient Assistance: {LZ MAP program list:31540}  Patient lives in a household of {NUMBERS:20191} {LZ MAP income choice:31503} via {Source of Income:[phone]}.  Medicare LIS Eligible: {YES/NO:21197} {LZLISELIG2024:31094}   2025 Poverty Guidelines  Family Size  200% 250%  300%  400%  500%   1  $31,300 $39,125  $46,950  $62,600 $78,250  2  $42,300 $52,875 $63,450  $84,600 $105,750  3  $53,300 $66,625 $79,950 $106,600 $133,250  4  64,300 $80,375 $96,450 $128,600 $160,750  Programs BI Cares Inhalers BI Cares AZ&Me BMS GSK NovoNordisk [employer] MedicareD: [employer] cholesterol grant   Assessment and Plan:   1. Medication Access {LZ MAP program list:31540} patient portion of application filled out today and uploaded to Media Tab.  Provider page placed in PCP inbox with instruction to fax to MAP program upon signature and to scan into chart.  Patient forms: {LZ MAP income:31500} Income documentation: {LZ MAP income documents:31501} Patient is not eligible for copay cards due to government insurance. Will collaborate with CPhT team to facilitate MAP assistance for *** CPhT Med Access team cc'd for future tracking/correspondences.  Upon completion  of signed forms, will fax full application to program and upload in Media Tab. {LZ MAP Fax:31505}.  Future Appointments  Date Time Provider Department Center  04/03/2024  9:00 AM [facility] PHARMACIST [facility] [address]  02/24/2025 10:10 AM [facility] ANNUAL WELLNESS VISIT 1 [facility] [address]    Manuelita FABIENE Kobs, PharmD, [place], CPP Clinical Pharmacist Practitioner {lzclinics:34253} Ph: [phone]

## 2024-04-02 NOTE — Telephone Encounter (Signed)
 Mailed letter to patient ,To address on file.

## 2024-04-03 ENCOUNTER — Other Ambulatory Visit (INDEPENDENT_AMBULATORY_CARE_PROVIDER_SITE_OTHER): Admitting: Pharmacist

## 2024-04-03 DIAGNOSIS — I480 Paroxysmal atrial fibrillation: Secondary | ICD-10-CM

## 2024-04-03 MED ORDER — GABAPENTIN 100 MG PO CAPS: 100.0000 mg | ORAL_CAPSULE | Freq: Three times a day (TID) | ORAL | 1 refills | Status: DC

## 2024-04-03 MED ORDER — OMEPRAZOLE 20 MG PO CPDR: 20.0000 mg | DELAYED_RELEASE_CAPSULE | Freq: Every day | ORAL | 1 refills | Status: AC | PRN

## 2024-04-03 MED ORDER — APIXABAN 2.5 MG PO TABS: 2.5000 mg | ORAL_TABLET | Freq: Two times a day (BID) | ORAL | 1 refills | Status: AC

## 2024-04-03 MED ORDER — ATORVASTATIN CALCIUM 80 MG PO TABS: 80.0000 mg | ORAL_TABLET | Freq: Every evening | ORAL | 1 refills | Status: AC

## 2024-04-03 NOTE — Patient Instructions (Addendum)
 Ms. RULA KENISTON and Candace,   It was a pleasure to speak with you today! As we discussed:?   I did look into Ms Postell's insurance benefits.  Since she does have Medicare and Medicaid, her generic medications should be $0 this year.  For brand medications, we discussed possibly getting the cost down to around $4 per month. It does look like this is the case if we prescribe for a 59-month supply at a time instead of a 40-month supply. So her Eliquis , instead of $12/month, would be the same $12 for 90 day supply (180 tablets).  If this cost is more feasible, we can certainly send this refill to the same pharmacy. However, please let us  know if this cost remains an issue.   This would be the same for the QVAR , though I can look to see if there are generic inhalers that the [facility] would cover for $0. Unfortunately, the Eliquis  does not come generic so the cost is stuck at price above.   You may reach me via MyChart Message, or you may leave me a voicemail at [phone] and I will get back to you shortly.   Thank you!   Future Appointments  Date Time Provider Department Center  02/24/2025 10:10 AM [facility] ANNUAL WELLNESS VISIT 1 [facility] [address]   Manuelita FABIENE Kobs, PharmD, [place], CPP Clinical Pharmacist Practitioner  [facility] Ph: [phone]

## 2024-04-03 NOTE — Telephone Encounter (Signed)
 Sent. Thanks.    Please check with patient about black stools. If she has black stools, esp off anticoagulation, then please let me know.    Please see when she is going to see the GI clinic.

## 2024-04-03 NOTE — Addendum Note (Signed)
 Addended by: CLEATUS LORELI RAMAN on: 04/03/2024 02:08 PM   Modules accepted: Orders

## 2024-04-05 NOTE — Telephone Encounter (Signed)
 Called cell was asked to home left message at that number to call office.   Please provide message from provider/office when call is returned from patient.

## 2024-04-10 NOTE — Telephone Encounter (Signed)
 Lvm (at home #) asking pt to call back. Pls relay Dr Elfredia message.   Carolyn, looks like a letter was mailed to pt on 04/02/24.]

## 2024-04-16 ENCOUNTER — Ambulatory Visit: Payer: Self-pay

## 2024-04-16 NOTE — Telephone Encounter (Signed)
 FYI Only or Action Required?: FYI only for provider: appointment scheduled on 04/18/24.  Patient was last seen in primary care on 02/27/2024 by Cleatus Arlyss RAMAN, MD.  Called Nurse Triage reporting Diarrhea.  Symptoms began about a month ago.  Interventions attempted: Rest, hydration, or home remedies.  Symptoms are: gradually worsening.  Triage Disposition: See Within 3 Days in Office (overriding See Physician Within 24 Hours)  Patient/caregiver understands and will follow disposition?: Yes  Reason for Disposition  [1] Blood in the stool AND [2] small amount of blood  (Exception: Only on toilet paper. Reason: Diarrhea can cause rectal irritation with blood on wiping.)  Answer Assessment - Initial Assessment Questions Patient states that she is still experiencing diarrhea daily. Amount varies everyday, but she also reports mild intermittent abdominal pain and a small amount of blood in stool. She also reports intermittent vomiting. Office visit advised. Unable to come in to office tomorrow due to transportation, scheduled for 04/18/24.   1. DIARRHEA SEVERITY: How bad is the diarrhea? How many more stools have you had in the past 24 hours than normal?      Mild-moderate, changes daily  2. ONSET: When did the diarrhea begin?      About a month ago  3. STOOL DESCRIPTION:  How loose or watery is the diarrhea? What is the stool color? Is there any blood or mucous in the stool?     Loose, does report small amount of blood in stool  4. VOMITING: Are you also vomiting? If Yes, ask: How many times in the past 24 hours?      Yes, on and off  5. ABDOMEN PAIN: Are you having any abdomen pain? If Yes, ask: What does it feel like? (e.g., crampy, dull, intermittent, constant)      Intermittent   6. ABDOMEN PAIN SEVERITY: If present, ask: How bad is the pain?  (e.g., Scale 1-10; mild, moderate, or severe)     Mild  7. ORAL INTAKE: If vomiting, Have you been able to drink  liquids? How much liquids have you had in the past 24 hours?     Yes, has been trying to drink more water   8. HYDRATION: Any signs of dehydration? (e.g., dry mouth [not just dry lips], too weak to stand, dizziness, new weight loss) When did you last urinate?     Denies signs of dehydration  9. EXPOSURE: Have you traveled to a foreign country recently? Have you been exposed to anyone with diarrhea? Could you have eaten any food that was spoiled?     No  10. ANTIBIOTIC USE: Are you taking antibiotics now or have you taken antibiotics in the past 2 months?       No  11. OTHER SYMPTOMS: Do you have any other symptoms? (e.g., fever, blood in stool)       Reports small amount of blood in stool  12. PREGNANCY: Is there any chance you are pregnant? When was your last menstrual period?       NA  Protocols used: [facility]  Reason for Triage: The patient is requesting a refill for apixaban  (Eliquis ) 2.5 mg tablets. She also reports that the provider was supposed to prescribe a medication for diarrhea, but she has not received anything.  The patient is still having uncontrollable diarrhea.The patient asks that the office contact her granddaughter, Dorien at [phone]

## 2024-04-17 NOTE — Telephone Encounter (Signed)
Will discuss with patient at office visit tomorrow

## 2024-04-18 ENCOUNTER — Ambulatory Visit
Admission: RE | Admit: 2024-04-18 | Discharge: 2024-04-18 | Disposition: A | Source: Ambulatory Visit | Attending: Family Medicine | Admitting: Family Medicine

## 2024-04-18 ENCOUNTER — Ambulatory Visit: Admitting: Family Medicine

## 2024-04-18 ENCOUNTER — Ambulatory Visit: Payer: Self-pay | Admitting: Family Medicine

## 2024-04-18 ENCOUNTER — Encounter: Payer: Self-pay | Admitting: Family Medicine

## 2024-04-18 VITALS — BP 120/60 | HR 76 | Temp 98.4°F | Ht 66.0 in | Wt 187.0 lb

## 2024-04-18 DIAGNOSIS — K529 Noninfective gastroenteritis and colitis, unspecified: Secondary | ICD-10-CM

## 2024-04-18 DIAGNOSIS — R059 Cough, unspecified: Secondary | ICD-10-CM

## 2024-04-18 DIAGNOSIS — R0602 Shortness of breath: Secondary | ICD-10-CM

## 2024-04-18 DIAGNOSIS — K625 Hemorrhage of anus and rectum: Secondary | ICD-10-CM

## 2024-04-18 LAB — COMPREHENSIVE METABOLIC PANEL WITH GFR
ALT: 45 U/L — ABNORMAL HIGH (ref 3–35)
AST: 53 U/L — ABNORMAL HIGH (ref 5–37)
Albumin: 4.1 g/dL (ref 3.5–5.2)
Alkaline Phosphatase: 81 U/L (ref 39–117)
BUN: 17 mg/dL (ref 6–23)
CO2: 32 meq/L (ref 19–32)
Calcium: 9 mg/dL (ref 8.4–10.5)
Chloride: 100 meq/L (ref 96–112)
Creatinine, Ser: 0.9 mg/dL (ref 0.40–1.20)
GFR: 57.98 mL/min — ABNORMAL LOW
Glucose, Bld: 95 mg/dL (ref 70–99)
Potassium: 4.4 meq/L (ref 3.5–5.1)
Sodium: 136 meq/L (ref 135–145)
Total Bilirubin: 0.7 mg/dL (ref 0.2–1.2)
Total Protein: 7.7 g/dL (ref 6.0–8.3)

## 2024-04-18 LAB — CBC WITH DIFFERENTIAL/PLATELET
Basophils Absolute: 0 10*3/uL (ref 0.0–0.1)
Basophils Relative: 0.2 % (ref 0.0–3.0)
Eosinophils Absolute: 0 10*3/uL (ref 0.0–0.7)
Eosinophils Relative: 0.4 % (ref 0.0–5.0)
HCT: 38.3 % (ref 36.0–46.0)
Hemoglobin: 12.7 g/dL (ref 12.0–15.0)
Lymphocytes Relative: 17.8 % (ref 12.0–46.0)
Lymphs Abs: 1.2 10*3/uL (ref 0.7–4.0)
MCHC: 33.2 g/dL (ref 30.0–36.0)
MCV: 87.3 fl (ref 78.0–100.0)
Monocytes Absolute: 0.4 10*3/uL (ref 0.1–1.0)
Monocytes Relative: 6.2 % (ref 3.0–12.0)
Neutro Abs: 5.1 10*3/uL (ref 1.4–7.7)
Neutrophils Relative %: 75.4 % (ref 43.0–77.0)
Platelets: 134 10*3/uL — ABNORMAL LOW (ref 150.0–400.0)
RBC: 4.38 Mil/uL (ref 3.87–5.11)
RDW: 14.9 % (ref 11.5–15.5)
WBC: 6.8 10*3/uL (ref 4.0–10.5)

## 2024-04-18 LAB — BRAIN NATRIURETIC PEPTIDE: Pro B Natriuretic peptide (BNP): 124 pg/mL — ABNORMAL HIGH (ref 1.0–100.0)

## 2024-04-18 MED ORDER — DICYCLOMINE HCL 10 MG PO CAPS: 10.0000 mg | ORAL_CAPSULE | Freq: Three times a day (TID) | ORAL | 3 refills | Status: AC

## 2024-04-18 MED ORDER — BENZONATATE 100 MG PO CAPS: 100.0000 mg | ORAL_CAPSULE | Freq: Three times a day (TID) | ORAL | 1 refills | Status: AC | PRN

## 2024-04-18 MED ORDER — GABAPENTIN 100 MG PO CAPS: ORAL_CAPSULE | ORAL | Status: AC

## 2024-04-18 MED ORDER — DOXYCYCLINE HYCLATE 100 MG PO TABS: 100.0000 mg | ORAL_TABLET | Freq: Two times a day (BID) | ORAL | 0 refills | Status: AC

## 2024-04-18 NOTE — Assessment & Plan Note (Signed)
 See notes on labs, cxr.   Start doxy with routine cautions.

## 2024-04-18 NOTE — Assessment & Plan Note (Signed)
 Echo pending per outside clinic.  She has some SOB attributed to recent illness.  See notes on labs.

## 2024-04-18 NOTE — Progress Notes (Signed)
 Diarrhea. She hasn't seen GI recently.  Has f/u pending but not until 06/2024.  Diarrhea is some better, worse with pinto beans, tomato, eggs, spicy foods.  Episodic BRBPR.  Sometimes with episodic black stools.  Sometimes with neither.   She isn't losing weight.  She is still taking plenty of liquid at baseline.   Cough, ST, going on for about 2 weeks.  Used a hot compress on her chest last night with some relief.  Sputum, yellow.  No fevers.  No facial pain. Some B ear pain.    Echo pending per outside clinic.  She has some SOB attributed to recent illness.    She had been using her walker at baseline at home.    She asked about tapering off gabapentin , discussed.   D/w pt about holding plavix  for now given BRBPR.  She is taking omeprazole .   Meds, vitals, and allergies reviewed.   ROS: Per HPI unless specifically indicated in ROS section   Nad Ncat TM wnl B Neck supple, no LA Sounds RRR No BLE edema.  Coarse exp B BS.   No focal dec in BS.  Abd soft normal BS.  Skin well perfused.   35 minutes were devoted to patient care in this encounter (this includes time spent reviewing the patient's file/history, interviewing and examining the patient, counseling/reviewing plan with patient).

## 2024-04-18 NOTE — Assessment & Plan Note (Signed)
 Hold clopidogrel  for now.  See notes on labs.  Still on eliquis  at this point.

## 2024-04-18 NOTE — Assessment & Plan Note (Signed)
 Will ask to have her GI appointment moved sooner.

## 2024-04-18 NOTE — Patient Instructions (Signed)
 Hold clopidogrel  for now.  Go to the lab on the way out.   If you have mychart we'll likely use that to update you.     Start doxycycline .  Use benzonatate  for the cough.  Rest and update.  Update us  as needed.    Taper gabapentin .  Take 2 a day for 3 days.  Then 1 a day for 3 days.  Then stop.   Take care.  Glad to see you.

## 2025-02-24 ENCOUNTER — Ambulatory Visit
